# Patient Record
Sex: Male | Born: 1960
Health system: Southern US, Community
[De-identification: ages and names within clinical notes are randomized; demographics above are authoritative.]

## PROBLEM LIST (undated history)

## (undated) DIAGNOSIS — F319 Bipolar disorder, unspecified: Secondary | ICD-10-CM

## (undated) DIAGNOSIS — R519 Headache, unspecified: Secondary | ICD-10-CM

## (undated) DIAGNOSIS — R51 Headache: Secondary | ICD-10-CM

## (undated) DIAGNOSIS — F419 Anxiety disorder, unspecified: Secondary | ICD-10-CM

## (undated) DIAGNOSIS — B1920 Unspecified viral hepatitis C without hepatic coma: Secondary | ICD-10-CM

## (undated) DIAGNOSIS — F329 Major depressive disorder, single episode, unspecified: Secondary | ICD-10-CM

## (undated) DIAGNOSIS — F32A Depression, unspecified: Secondary | ICD-10-CM

## (undated) DIAGNOSIS — K219 Gastro-esophageal reflux disease without esophagitis: Secondary | ICD-10-CM

## (undated) DIAGNOSIS — I219 Acute myocardial infarction, unspecified: Secondary | ICD-10-CM

## (undated) DIAGNOSIS — F1410 Cocaine abuse, uncomplicated: Secondary | ICD-10-CM

## (undated) DIAGNOSIS — M109 Gout, unspecified: Secondary | ICD-10-CM

## (undated) DIAGNOSIS — F1010 Alcohol abuse, uncomplicated: Secondary | ICD-10-CM

## (undated) DIAGNOSIS — Z9989 Dependence on other enabling machines and devices: Secondary | ICD-10-CM

## (undated) DIAGNOSIS — E559 Vitamin D deficiency, unspecified: Secondary | ICD-10-CM

## (undated) DIAGNOSIS — M1990 Unspecified osteoarthritis, unspecified site: Secondary | ICD-10-CM

## (undated) DIAGNOSIS — I10 Essential (primary) hypertension: Secondary | ICD-10-CM

## (undated) DIAGNOSIS — Z720 Tobacco use: Secondary | ICD-10-CM

## (undated) DIAGNOSIS — M549 Dorsalgia, unspecified: Secondary | ICD-10-CM

## (undated) DIAGNOSIS — G8929 Other chronic pain: Secondary | ICD-10-CM

## (undated) DIAGNOSIS — K649 Unspecified hemorrhoids: Secondary | ICD-10-CM

## (undated) DIAGNOSIS — G4733 Obstructive sleep apnea (adult) (pediatric): Secondary | ICD-10-CM

## (undated) DIAGNOSIS — G4700 Insomnia, unspecified: Secondary | ICD-10-CM

## (undated) DIAGNOSIS — F4310 Post-traumatic stress disorder, unspecified: Secondary | ICD-10-CM

## (undated) DIAGNOSIS — N189 Chronic kidney disease, unspecified: Secondary | ICD-10-CM

## (undated) HISTORY — PX: PATELLA FRACTURE SURGERY: SHX735

## (undated) HISTORY — PX: FRACTURE SURGERY: SHX138

## (undated) HISTORY — PX: TONSILLECTOMY: SUR1361

---

## 2001-02-10 ENCOUNTER — Inpatient Hospital Stay (HOSPITAL_COMMUNITY): Admission: EM | Admit: 2001-02-10 | Discharge: 2001-03-04 | Payer: Self-pay | Admitting: Psychiatry

## 2001-02-19 ENCOUNTER — Encounter (HOSPITAL_COMMUNITY): Payer: Self-pay | Admitting: Psychiatry

## 2001-02-25 ENCOUNTER — Encounter: Payer: Self-pay | Admitting: Psychiatry

## 2001-05-06 ENCOUNTER — Encounter: Admission: RE | Admit: 2001-05-06 | Discharge: 2001-05-06 | Payer: Self-pay | Admitting: Internal Medicine

## 2001-05-06 ENCOUNTER — Encounter: Payer: Self-pay | Admitting: Internal Medicine

## 2001-07-05 ENCOUNTER — Encounter: Payer: Self-pay | Admitting: Internal Medicine

## 2001-07-05 ENCOUNTER — Encounter: Admission: RE | Admit: 2001-07-05 | Discharge: 2001-07-05 | Payer: Self-pay | Admitting: Internal Medicine

## 2001-10-05 ENCOUNTER — Inpatient Hospital Stay (HOSPITAL_COMMUNITY): Admission: EM | Admit: 2001-10-05 | Discharge: 2001-10-10 | Payer: Self-pay | Admitting: Psychiatry

## 2001-11-10 ENCOUNTER — Inpatient Hospital Stay (HOSPITAL_COMMUNITY): Admission: AD | Admit: 2001-11-10 | Discharge: 2001-11-22 | Payer: Self-pay | Admitting: Psychiatry

## 2001-11-10 ENCOUNTER — Emergency Department (HOSPITAL_COMMUNITY): Admission: EM | Admit: 2001-11-10 | Discharge: 2001-11-10 | Payer: Self-pay | Admitting: Emergency Medicine

## 2001-11-22 ENCOUNTER — Emergency Department (HOSPITAL_COMMUNITY): Admission: EM | Admit: 2001-11-22 | Discharge: 2001-11-22 | Payer: Self-pay | Admitting: Emergency Medicine

## 2001-12-13 ENCOUNTER — Emergency Department (HOSPITAL_COMMUNITY): Admission: EM | Admit: 2001-12-13 | Discharge: 2001-12-13 | Payer: Self-pay | Admitting: Emergency Medicine

## 2002-04-17 ENCOUNTER — Ambulatory Visit (HOSPITAL_COMMUNITY): Admission: RE | Admit: 2002-04-17 | Discharge: 2002-04-17 | Payer: Self-pay | Admitting: *Deleted

## 2002-04-17 ENCOUNTER — Encounter (INDEPENDENT_AMBULATORY_CARE_PROVIDER_SITE_OTHER): Payer: Self-pay

## 2002-06-06 ENCOUNTER — Ambulatory Visit (HOSPITAL_COMMUNITY): Admission: RE | Admit: 2002-06-06 | Discharge: 2002-06-06 | Payer: Self-pay | Admitting: *Deleted

## 2002-07-24 ENCOUNTER — Encounter: Payer: Self-pay | Admitting: Emergency Medicine

## 2002-07-24 ENCOUNTER — Inpatient Hospital Stay (HOSPITAL_COMMUNITY): Admission: EM | Admit: 2002-07-24 | Discharge: 2002-07-30 | Payer: Self-pay | Admitting: Psychiatry

## 2004-09-10 ENCOUNTER — Ambulatory Visit: Payer: Self-pay | Admitting: Psychiatry

## 2004-09-10 ENCOUNTER — Inpatient Hospital Stay (HOSPITAL_COMMUNITY): Admission: EM | Admit: 2004-09-10 | Discharge: 2004-09-20 | Payer: Self-pay | Admitting: Psychiatry

## 2004-09-10 ENCOUNTER — Encounter: Payer: Self-pay | Admitting: Emergency Medicine

## 2004-12-19 ENCOUNTER — Emergency Department (HOSPITAL_COMMUNITY): Admission: EM | Admit: 2004-12-19 | Discharge: 2004-12-20 | Payer: Self-pay | Admitting: Emergency Medicine

## 2005-07-23 ENCOUNTER — Emergency Department (HOSPITAL_COMMUNITY): Admission: EM | Admit: 2005-07-23 | Discharge: 2005-07-24 | Payer: Self-pay | Admitting: Emergency Medicine

## 2005-07-27 ENCOUNTER — Emergency Department (HOSPITAL_COMMUNITY): Admission: EM | Admit: 2005-07-27 | Discharge: 2005-07-27 | Payer: Self-pay | Admitting: Family Medicine

## 2005-07-27 ENCOUNTER — Ambulatory Visit (HOSPITAL_COMMUNITY): Admission: RE | Admit: 2005-07-27 | Discharge: 2005-07-27 | Payer: Self-pay | Admitting: Family Medicine

## 2005-09-01 ENCOUNTER — Emergency Department (HOSPITAL_COMMUNITY): Admission: EM | Admit: 2005-09-01 | Discharge: 2005-09-01 | Payer: Self-pay | Admitting: Emergency Medicine

## 2005-09-06 ENCOUNTER — Inpatient Hospital Stay (HOSPITAL_COMMUNITY): Admission: EM | Admit: 2005-09-06 | Discharge: 2005-09-08 | Payer: Self-pay | Admitting: Urology

## 2005-12-07 ENCOUNTER — Ambulatory Visit: Payer: Self-pay | Admitting: Family Medicine

## 2005-12-28 ENCOUNTER — Ambulatory Visit: Payer: Self-pay | Admitting: Family Medicine

## 2006-01-23 ENCOUNTER — Ambulatory Visit: Payer: Self-pay | Admitting: Family Medicine

## 2006-05-19 ENCOUNTER — Inpatient Hospital Stay (HOSPITAL_COMMUNITY): Admission: EM | Admit: 2006-05-19 | Discharge: 2006-05-21 | Payer: Self-pay | Admitting: Emergency Medicine

## 2008-02-07 ENCOUNTER — Emergency Department (HOSPITAL_COMMUNITY): Admission: EM | Admit: 2008-02-07 | Discharge: 2008-02-07 | Payer: Self-pay | Admitting: Emergency Medicine

## 2008-05-04 ENCOUNTER — Emergency Department (HOSPITAL_COMMUNITY): Admission: EM | Admit: 2008-05-04 | Discharge: 2008-05-05 | Payer: Self-pay | Admitting: Emergency Medicine

## 2008-05-31 ENCOUNTER — Emergency Department (HOSPITAL_COMMUNITY): Admission: EM | Admit: 2008-05-31 | Discharge: 2008-06-01 | Payer: Self-pay | Admitting: Emergency Medicine

## 2008-06-01 ENCOUNTER — Ambulatory Visit: Payer: Self-pay | Admitting: *Deleted

## 2008-06-01 ENCOUNTER — Inpatient Hospital Stay (HOSPITAL_COMMUNITY): Admission: RE | Admit: 2008-06-01 | Discharge: 2008-06-08 | Payer: Self-pay | Admitting: *Deleted

## 2009-04-26 ENCOUNTER — Emergency Department (HOSPITAL_COMMUNITY): Admission: EM | Admit: 2009-04-26 | Discharge: 2009-04-27 | Payer: Self-pay | Admitting: Emergency Medicine

## 2009-04-27 ENCOUNTER — Ambulatory Visit: Payer: Self-pay | Admitting: Psychiatry

## 2009-04-27 ENCOUNTER — Inpatient Hospital Stay (HOSPITAL_COMMUNITY): Admission: AD | Admit: 2009-04-27 | Discharge: 2009-04-30 | Payer: Self-pay | Admitting: Psychiatry

## 2009-12-08 ENCOUNTER — Ambulatory Visit: Payer: Self-pay | Admitting: Psychiatry

## 2009-12-08 ENCOUNTER — Emergency Department (HOSPITAL_COMMUNITY): Admission: EM | Admit: 2009-12-08 | Discharge: 2009-12-08 | Payer: Self-pay | Admitting: Emergency Medicine

## 2009-12-08 ENCOUNTER — Inpatient Hospital Stay (HOSPITAL_COMMUNITY): Admission: AD | Admit: 2009-12-08 | Discharge: 2009-12-10 | Payer: Self-pay | Admitting: Psychiatry

## 2009-12-14 ENCOUNTER — Emergency Department (HOSPITAL_COMMUNITY): Admission: EM | Admit: 2009-12-14 | Discharge: 2009-12-14 | Payer: Self-pay | Admitting: Emergency Medicine

## 2009-12-14 ENCOUNTER — Emergency Department (HOSPITAL_COMMUNITY)
Admission: EM | Admit: 2009-12-14 | Discharge: 2009-12-21 | Disposition: A | Discharge status: Other Acute Inpatient Hospital | Payer: Self-pay | Source: Home / Self Care | Admitting: Emergency Medicine

## 2010-03-13 ENCOUNTER — Emergency Department (HOSPITAL_COMMUNITY)
Admission: EM | Admit: 2010-03-13 | Discharge: 2010-03-13 | Payer: Self-pay | Source: Home / Self Care | Admitting: Emergency Medicine

## 2010-03-16 ENCOUNTER — Emergency Department (HOSPITAL_COMMUNITY)
Admission: EM | Admit: 2010-03-16 | Discharge: 2010-03-17 | Disposition: A | Discharge status: Other Acute Inpatient Hospital | Payer: Self-pay | Source: Home / Self Care | Admitting: Emergency Medicine

## 2010-06-20 LAB — URINALYSIS, ROUTINE W REFLEX MICROSCOPIC
Bilirubin Urine: NEGATIVE
Bilirubin Urine: NEGATIVE
Glucose, UA: NEGATIVE mg/dL
Glucose, UA: NEGATIVE mg/dL
Hgb urine dipstick: NEGATIVE
Ketones, ur: 15 mg/dL — AB
Ketones, ur: NEGATIVE mg/dL
Nitrite: NEGATIVE
Nitrite: POSITIVE — AB
Protein, ur: 30 mg/dL — AB
Protein, ur: NEGATIVE mg/dL
Specific Gravity, Urine: 1.018 (ref 1.005–1.030)
Specific Gravity, Urine: 1.03 (ref 1.005–1.030)
Urobilinogen, UA: 0.2 mg/dL (ref 0.0–1.0)
Urobilinogen, UA: 1 mg/dL (ref 0.0–1.0)
pH: 6.5 (ref 5.0–8.0)
pH: 8 (ref 5.0–8.0)

## 2010-06-20 LAB — COMPREHENSIVE METABOLIC PANEL
ALT: 187 U/L — ABNORMAL HIGH (ref 0–53)
ALT: 267 U/L — ABNORMAL HIGH (ref 0–53)
AST: 125 U/L — ABNORMAL HIGH (ref 0–37)
AST: 73 U/L — ABNORMAL HIGH (ref 0–37)
Albumin: 3.9 g/dL (ref 3.5–5.2)
Albumin: 4 g/dL (ref 3.5–5.2)
Alkaline Phosphatase: 47 U/L (ref 39–117)
Alkaline Phosphatase: 50 U/L (ref 39–117)
BUN: 15 mg/dL (ref 6–23)
BUN: 8 mg/dL (ref 6–23)
CO2: 19 mEq/L (ref 19–32)
CO2: 25 mEq/L (ref 19–32)
Calcium: 9.2 mg/dL (ref 8.4–10.5)
Calcium: 9.6 mg/dL (ref 8.4–10.5)
Chloride: 108 mEq/L (ref 96–112)
Chloride: 98 mEq/L (ref 96–112)
Creatinine, Ser: 1.02 mg/dL (ref 0.4–1.5)
Creatinine, Ser: 1.21 mg/dL (ref 0.4–1.5)
GFR calc Af Amer: >60 mL/min (ref >60)
GFR calc Af Amer: >60 mL/min (ref >60)
GFR calc non Af Amer: >60 mL/min (ref >60)
GFR calc non Af Amer: >60 mL/min (ref >60)
Glucose, Bld: 101 mg/dL — ABNORMAL HIGH (ref 70–99)
Glucose, Bld: 119 mg/dL — ABNORMAL HIGH (ref 70–99)
Potassium: 3.3 mEq/L — ABNORMAL LOW (ref 3.5–5.1)
Potassium: 3.6 mEq/L (ref 3.5–5.1)
Sodium: 131 mEq/L — ABNORMAL LOW (ref 135–145)
Sodium: 141 mEq/L (ref 135–145)
Total Bilirubin: 0.6 mg/dL (ref 0.3–1.2)
Total Bilirubin: 0.8 mg/dL (ref 0.3–1.2)
Total Protein: 7.1 g/dL (ref 6.0–8.3)
Total Protein: 7.4 g/dL (ref 6.0–8.3)

## 2010-06-20 LAB — CBC
HCT: 43.5 % (ref 39.0–52.0)
HCT: 44.9 % (ref 39.0–52.0)
Hemoglobin: 15.6 g/dL (ref 13.0–17.0)
Hemoglobin: 16 g/dL (ref 13.0–17.0)
MCH: 30.2 pg (ref 26.0–34.0)
MCH: 30.4 pg (ref 26.0–34.0)
MCHC: 35.6 g/dL (ref 30.0–36.0)
MCHC: 35.9 g/dL (ref 30.0–36.0)
MCV: 84.1 fL (ref 78.0–100.0)
MCV: 85.2 fL (ref 78.0–100.0)
Platelets: 248 10*3/uL (ref 150–400)
Platelets: 274 10*3/uL (ref 150–400)
RBC: 5.17 MIL/uL (ref 4.22–5.81)
RBC: 5.27 MIL/uL (ref 4.22–5.81)
RDW: 13.9 % (ref 11.5–15.5)
RDW: 14.3 % (ref 11.5–15.5)
WBC: 10.3 10*3/uL (ref 4.0–10.5)
WBC: 8.8 10*3/uL (ref 4.0–10.5)

## 2010-06-20 LAB — DIFFERENTIAL
Basophils Absolute: 0 10*3/uL (ref 0.0–0.1)
Basophils Relative: 1 % (ref 0–1)
Eosinophils Absolute: 0.2 10*3/uL (ref 0.0–0.7)
Eosinophils Relative: 2 % (ref 0–5)
Lymphocytes Relative: 25 % (ref 12–46)
Lymphs Abs: 2.2 10*3/uL (ref 0.7–4.0)
Monocytes Absolute: 0.7 10*3/uL (ref 0.1–1.0)
Monocytes Relative: 8 % (ref 3–12)
Neutro Abs: 5.7 10*3/uL (ref 1.7–7.7)
Neutrophils Relative %: 65 % (ref 43–77)

## 2010-06-20 LAB — RAPID URINE DRUG SCREEN, HOSP PERFORMED
Amphetamines: NOT DETECTED
Amphetamines: NOT DETECTED
Barbiturates: NOT DETECTED
Barbiturates: NOT DETECTED
Benzodiazepines: NOT DETECTED
Benzodiazepines: NOT DETECTED
Cocaine: POSITIVE — AB
Cocaine: POSITIVE — AB
Opiates: NOT DETECTED
Opiates: NOT DETECTED
Tetrahydrocannabinol: POSITIVE — AB
Tetrahydrocannabinol: POSITIVE — AB

## 2010-06-20 LAB — CK TOTAL AND CKMB (NOT AT ARMC)
CK, MB: 1.9 ng/mL (ref 0.3–4.0)
CK, MB: 2.3 ng/mL (ref 0.3–4.0)
Relative Index: 1.2 (ref 0.0–2.5)
Relative Index: 1.2 (ref 0.0–2.5)
Total CK: 161 U/L (ref 7–232)
Total CK: 188 U/L (ref 7–232)

## 2010-06-20 LAB — TROPONIN I
Troponin I: 0.01 ng/mL (ref 0.00–0.06)
Troponin I: 0.01 ng/mL (ref 0.00–0.06)

## 2010-06-20 LAB — URINE MICROSCOPIC-ADD ON

## 2010-06-20 LAB — ETHANOL: Alcohol, Ethyl (B): 7 mg/dL (ref 0–10)

## 2010-06-23 LAB — URINE MICROSCOPIC-ADD ON

## 2010-06-23 LAB — GLUCOSE, CAPILLARY
Glucose-Capillary: 100 mg/dL — ABNORMAL HIGH (ref 70–99)
Glucose-Capillary: 105 mg/dL — ABNORMAL HIGH (ref 70–99)
Glucose-Capillary: 127 mg/dL — ABNORMAL HIGH (ref 70–99)
Glucose-Capillary: 101 mg/dL — ABNORMAL HIGH (ref 70–99)
Glucose-Capillary: 102 mg/dL — ABNORMAL HIGH (ref 70–99)
Glucose-Capillary: 105 mg/dL — ABNORMAL HIGH (ref 70–99)
Glucose-Capillary: 111 mg/dL — ABNORMAL HIGH (ref 70–99)
Glucose-Capillary: 115 mg/dL — ABNORMAL HIGH (ref 70–99)
Glucose-Capillary: 122 mg/dL — ABNORMAL HIGH (ref 70–99)
Glucose-Capillary: 140 mg/dL — ABNORMAL HIGH (ref 70–99)
Glucose-Capillary: 142 mg/dL — ABNORMAL HIGH (ref 70–99)

## 2010-06-23 LAB — DIFFERENTIAL
Basophils Absolute: 0 10*3/uL (ref 0.0–0.1)
Basophils Absolute: 0 10*3/uL (ref 0.0–0.1)
Basophils Absolute: 0 10*3/uL (ref 0.0–0.1)
Basophils Relative: 0 % (ref 0–1)
Basophils Relative: 0 % (ref 0–1)
Basophils Relative: 0 % (ref 0–1)
Eosinophils Absolute: 0.2 10*3/uL (ref 0.0–0.7)
Eosinophils Absolute: 0.3 10*3/uL (ref 0.0–0.7)
Eosinophils Absolute: 0.4 10*3/uL (ref 0.0–0.7)
Eosinophils Relative: 2 % (ref 0–5)
Eosinophils Relative: 3 % (ref 0–5)
Eosinophils Relative: 5 % (ref 0–5)
Lymphocytes Relative: 19 % (ref 12–46)
Lymphocytes Relative: 22 % (ref 12–46)
Lymphocytes Relative: 26 % (ref 12–46)
Lymphs Abs: 1.8 10*3/uL (ref 0.7–4.0)
Lymphs Abs: 1.8 10*3/uL (ref 0.7–4.0)
Lymphs Abs: 2.1 10*3/uL (ref 0.7–4.0)
Monocytes Absolute: 0.5 10*3/uL (ref 0.1–1.0)
Monocytes Absolute: 0.7 10*3/uL (ref 0.1–1.0)
Monocytes Absolute: 0.6 10*3/uL (ref 0.1–1.0)
Monocytes Relative: 7 % (ref 3–12)
Monocytes Relative: 7 % (ref 3–12)
Monocytes Relative: 7 % (ref 3–12)
Neutro Abs: 5.6 10*3/uL (ref 1.7–7.7)
Neutro Abs: 6.8 10*3/uL (ref 1.7–7.7)
Neutro Abs: 5.1 10*3/uL (ref 1.7–7.7)
Neutrophils Relative %: 69 % (ref 43–77)
Neutrophils Relative %: 70 % (ref 43–77)
Neutrophils Relative %: 62 % (ref 43–77)

## 2010-06-23 LAB — URINALYSIS, ROUTINE W REFLEX MICROSCOPIC
Glucose, UA: NEGATIVE mg/dL
Hgb urine dipstick: NEGATIVE
Ketones, ur: 15 mg/dL — AB
Nitrite: NEGATIVE
Protein, ur: NEGATIVE mg/dL
Specific Gravity, Urine: 1.024 (ref 1.005–1.030)
Urobilinogen, UA: 2 mg/dL — ABNORMAL HIGH (ref 0.0–1.0)
pH: 6 (ref 5.0–8.0)

## 2010-06-23 LAB — CBC
HCT: 46.2 % (ref 39.0–52.0)
HCT: 46.4 % (ref 39.0–52.0)
HCT: 45.9 % (ref 39.0–52.0)
Hemoglobin: 16 g/dL (ref 13.0–17.0)
Hemoglobin: 16.8 g/dL (ref 13.0–17.0)
Hemoglobin: 16.1 g/dL (ref 13.0–17.0)
MCH: 30.3 pg (ref 26.0–34.0)
MCH: 30.3 pg (ref 26.0–34.0)
MCH: 30 pg (ref 26.0–34.0)
MCHC: 34.5 g/dL (ref 30.0–36.0)
MCHC: 36.4 g/dL — ABNORMAL HIGH (ref 30.0–36.0)
MCHC: 35.1 g/dL (ref 30.0–36.0)
MCV: 83.2 fL (ref 78.0–100.0)
MCV: 88.1 fL (ref 78.0–100.0)
MCV: 85.5 fL (ref 78.0–100.0)
Platelets: 241 10*3/uL (ref 150–400)
Platelets: 286 10*3/uL (ref 150–400)
Platelets: 260 10*3/uL (ref 150–400)
RBC: 5.27 MIL/uL (ref 4.22–5.81)
RBC: 5.55 MIL/uL (ref 4.22–5.81)
RBC: 5.37 MIL/uL (ref 4.22–5.81)
RDW: 13.1 % (ref 11.5–15.5)
RDW: 14.2 % (ref 11.5–15.5)
RDW: 13.7 % (ref 11.5–15.5)
WBC: 8.1 10*3/uL (ref 4.0–10.5)
WBC: 9.6 10*3/uL (ref 4.0–10.5)
WBC: 8.1 10*3/uL (ref 4.0–10.5)

## 2010-06-23 LAB — BASIC METABOLIC PANEL
BUN: 11 mg/dL (ref 6–23)
BUN: 7 mg/dL (ref 6–23)
BUN: 8 mg/dL (ref 6–23)
CO2: 23 mEq/L (ref 19–32)
CO2: 24 mEq/L (ref 19–32)
CO2: 24 mEq/L (ref 19–32)
Calcium: 9.4 mg/dL (ref 8.4–10.5)
Calcium: 9.5 mg/dL (ref 8.4–10.5)
Calcium: 9.3 mg/dL (ref 8.4–10.5)
Chloride: 105 mEq/L (ref 96–112)
Chloride: 109 mEq/L (ref 96–112)
Chloride: 109 mEq/L (ref 96–112)
Creatinine, Ser: 0.96 mg/dL (ref 0.4–1.5)
Creatinine, Ser: 1.05 mg/dL (ref 0.4–1.5)
Creatinine, Ser: 1.05 mg/dL (ref 0.4–1.5)
GFR calc Af Amer: >60 mL/min (ref >60)
GFR calc Af Amer: >60 mL/min (ref >60)
GFR calc Af Amer: >60 mL/min (ref >60)
GFR calc non Af Amer: >60 mL/min (ref >60)
GFR calc non Af Amer: >60 mL/min (ref >60)
GFR calc non Af Amer: >60 mL/min (ref >60)
Glucose, Bld: 135 mg/dL — ABNORMAL HIGH (ref 70–99)
Glucose, Bld: 180 mg/dL — ABNORMAL HIGH (ref 70–99)
Glucose, Bld: 102 mg/dL — ABNORMAL HIGH (ref 70–99)
Potassium: 3.2 mEq/L — ABNORMAL LOW (ref 3.5–5.1)
Potassium: 3.5 mEq/L (ref 3.5–5.1)
Potassium: 4.1 mEq/L (ref 3.5–5.1)
Sodium: 138 mEq/L (ref 135–145)
Sodium: 139 mEq/L (ref 135–145)
Sodium: 141 mEq/L (ref 135–145)

## 2010-06-23 LAB — ETHANOL
Alcohol, Ethyl (B): <5 mg/dL (ref 0–10)
Alcohol, Ethyl (B): <5 mg/dL (ref 0–10)
Alcohol, Ethyl (B): <5 mg/dL (ref 0–10)

## 2010-06-23 LAB — RAPID URINE DRUG SCREEN, HOSP PERFORMED
Amphetamines: NOT DETECTED
Amphetamines: NOT DETECTED
Amphetamines: NOT DETECTED
Barbiturates: NOT DETECTED
Barbiturates: NOT DETECTED
Barbiturates: NOT DETECTED
Benzodiazepines: NOT DETECTED
Benzodiazepines: NOT DETECTED
Benzodiazepines: NOT DETECTED
Cocaine: POSITIVE — AB
Cocaine: POSITIVE — AB
Cocaine: POSITIVE — AB
Opiates: NOT DETECTED
Opiates: NOT DETECTED
Opiates: NOT DETECTED
Tetrahydrocannabinol: POSITIVE — AB
Tetrahydrocannabinol: POSITIVE — AB
Tetrahydrocannabinol: POSITIVE — AB

## 2010-06-23 LAB — TRICYCLICS SCREEN, URINE
TCA Scrn: 19.8 — AB
TCA Scrn: NOT DETECTED

## 2010-06-23 LAB — ACETAMINOPHEN LEVEL: Acetaminophen (Tylenol), Serum: <10 ug/mL — ABNORMAL LOW (ref 10–30)

## 2010-06-23 LAB — LITHIUM LEVEL: Lithium Lvl: <0.25 mEq/L — ABNORMAL LOW (ref 0.80–1.40)

## 2010-06-26 LAB — CBC
HCT: 48.6 % (ref 39.0–52.0)
Hemoglobin: 16.3 g/dL (ref 13.0–17.0)
MCHC: 33.5 g/dL (ref 30.0–36.0)
MCV: 88.3 fL (ref 78.0–100.0)
Platelets: 290 10*3/uL (ref 150–400)
RBC: 5.5 MIL/uL (ref 4.22–5.81)
RDW: 13.9 % (ref 11.5–15.5)
WBC: 10.9 10*3/uL — ABNORMAL HIGH (ref 4.0–10.5)

## 2010-06-26 LAB — GLUCOSE, CAPILLARY
Glucose-Capillary: 105 mg/dL — ABNORMAL HIGH (ref 70–99)
Glucose-Capillary: 110 mg/dL — ABNORMAL HIGH (ref 70–99)
Glucose-Capillary: 130 mg/dL — ABNORMAL HIGH (ref 70–99)
Glucose-Capillary: 138 mg/dL — ABNORMAL HIGH (ref 70–99)
Glucose-Capillary: 90 mg/dL (ref 70–99)
Glucose-Capillary: 92 mg/dL (ref 70–99)

## 2010-06-26 LAB — BASIC METABOLIC PANEL
BUN: 12 mg/dL (ref 6–23)
CO2: 24 mEq/L (ref 19–32)
Calcium: 9.4 mg/dL (ref 8.4–10.5)
Chloride: 102 mEq/L (ref 96–112)
Creatinine, Ser: 0.94 mg/dL (ref 0.4–1.5)
GFR calc Af Amer: >60 mL/min (ref >60)
GFR calc non Af Amer: >60 mL/min (ref >60)
Glucose, Bld: 90 mg/dL (ref 70–99)
Potassium: 4.4 mEq/L (ref 3.5–5.1)
Sodium: 134 mEq/L — ABNORMAL LOW (ref 135–145)

## 2010-06-26 LAB — DIFFERENTIAL
Basophils Absolute: 0.1 10*3/uL (ref 0.0–0.1)
Basophils Relative: 1 % (ref 0–1)
Eosinophils Absolute: 0.4 10*3/uL (ref 0.0–0.7)
Eosinophils Relative: 4 % (ref 0–5)
Lymphocytes Relative: 26 % (ref 12–46)
Lymphs Abs: 2.8 10*3/uL (ref 0.7–4.0)
Monocytes Absolute: 0.8 10*3/uL (ref 0.1–1.0)
Monocytes Relative: 7 % (ref 3–12)
Neutro Abs: 6.8 10*3/uL (ref 1.7–7.7)
Neutrophils Relative %: 62 % (ref 43–77)

## 2010-06-26 LAB — ETHANOL: Alcohol, Ethyl (B): <5 mg/dL (ref 0–10)

## 2010-06-26 LAB — RAPID URINE DRUG SCREEN, HOSP PERFORMED
Amphetamines: NOT DETECTED
Barbiturates: NOT DETECTED
Benzodiazepines: NOT DETECTED
Cocaine: POSITIVE — AB
Opiates: NOT DETECTED
Tetrahydrocannabinol: NOT DETECTED

## 2010-06-26 LAB — ACETAMINOPHEN LEVEL: Acetaminophen (Tylenol), Serum: <10 ug/mL — ABNORMAL LOW (ref 10–30)

## 2010-07-25 LAB — COMPREHENSIVE METABOLIC PANEL
ALT: 390 U/L — ABNORMAL HIGH (ref 0–53)
AST: 229 U/L — ABNORMAL HIGH (ref 0–37)
Albumin: 4.6 g/dL (ref 3.5–5.2)
Alkaline Phosphatase: 56 U/L (ref 39–117)
BUN: 9 mg/dL (ref 6–23)
CO2: 22 mEq/L (ref 19–32)
Calcium: 9.7 mg/dL (ref 8.4–10.5)
Chloride: 102 mEq/L (ref 96–112)
Creatinine, Ser: 1.03 mg/dL (ref 0.4–1.5)
GFR calc Af Amer: >60 mL/min (ref >60)
GFR calc non Af Amer: >60 mL/min (ref >60)
Glucose, Bld: 101 mg/dL — ABNORMAL HIGH (ref 70–99)
Potassium: 3.3 mEq/L — ABNORMAL LOW (ref 3.5–5.1)
Sodium: 134 mEq/L — ABNORMAL LOW (ref 135–145)
Total Bilirubin: 0.9 mg/dL (ref 0.3–1.2)
Total Protein: 8.2 g/dL (ref 6.0–8.3)

## 2010-07-25 LAB — CBC
HCT: 31.5 % — ABNORMAL LOW (ref 39.0–52.0)
Hemoglobin: 10.7 g/dL — ABNORMAL LOW (ref 13.0–17.0)
MCHC: 34.2 g/dL (ref 30.0–36.0)
MCV: 88.1 fL (ref 78.0–100.0)
Platelets: 104 10*3/uL — ABNORMAL LOW (ref 150–400)
RBC: 3.57 MIL/uL — ABNORMAL LOW (ref 4.22–5.81)
RDW: 13.5 % (ref 11.5–15.5)
WBC: 11.3 10*3/uL — ABNORMAL HIGH (ref 4.0–10.5)

## 2010-07-25 LAB — URINE MICROSCOPIC-ADD ON

## 2010-07-25 LAB — URINALYSIS, ROUTINE W REFLEX MICROSCOPIC
Glucose, UA: NEGATIVE mg/dL
Hgb urine dipstick: NEGATIVE
Ketones, ur: 15 mg/dL — AB
Nitrite: NEGATIVE
Protein, ur: NEGATIVE mg/dL
Specific Gravity, Urine: 1.02 (ref 1.005–1.030)
Urobilinogen, UA: 2 mg/dL — ABNORMAL HIGH (ref 0.0–1.0)
pH: 8 (ref 5.0–8.0)

## 2010-07-25 LAB — GLUCOSE, CAPILLARY: Glucose-Capillary: 100 mg/dL — ABNORMAL HIGH (ref 70–99)

## 2010-07-25 LAB — DIFFERENTIAL
Basophils Absolute: 0.1 10*3/uL (ref 0.0–0.1)
Basophils Relative: 1 % (ref 0–1)
Eosinophils Absolute: 0.1 10*3/uL (ref 0.0–0.7)
Eosinophils Relative: 1 % (ref 0–5)
Lymphocytes Relative: 22 % (ref 12–46)
Lymphs Abs: 2.5 10*3/uL (ref 0.7–4.0)
Monocytes Absolute: 0.6 10*3/uL (ref 0.1–1.0)
Monocytes Relative: 5 % (ref 3–12)
Neutro Abs: 8 10*3/uL — ABNORMAL HIGH (ref 1.7–7.7)
Neutrophils Relative %: 71 % (ref 43–77)

## 2010-07-25 LAB — LIPASE, BLOOD: Lipase: 40 U/L (ref 11–59)

## 2010-07-26 LAB — RAPID URINE DRUG SCREEN, HOSP PERFORMED
Amphetamines: NOT DETECTED
Barbiturates: NOT DETECTED
Benzodiazepines: NOT DETECTED
Cocaine: POSITIVE — AB
Opiates: NOT DETECTED
Tetrahydrocannabinol: POSITIVE — AB

## 2010-07-26 LAB — GLUCOSE, CAPILLARY
Glucose-Capillary: 107 mg/dL — ABNORMAL HIGH (ref 70–99)
Glucose-Capillary: 124 mg/dL — ABNORMAL HIGH (ref 70–99)
Glucose-Capillary: 141 mg/dL — ABNORMAL HIGH (ref 70–99)
Glucose-Capillary: 141 mg/dL — ABNORMAL HIGH (ref 70–99)
Glucose-Capillary: 147 mg/dL — ABNORMAL HIGH (ref 70–99)
Glucose-Capillary: 150 mg/dL — ABNORMAL HIGH (ref 70–99)
Glucose-Capillary: 160 mg/dL — ABNORMAL HIGH (ref 70–99)
Glucose-Capillary: 98 mg/dL (ref 70–99)

## 2010-07-26 LAB — ACETAMINOPHEN LEVEL: Acetaminophen (Tylenol), Serum: <10 ug/mL — ABNORMAL LOW (ref 10–30)

## 2010-07-26 LAB — POCT I-STAT, CHEM 8
BUN: 12 mg/dL (ref 6–23)
Calcium, Ion: 1.13 mmol/L (ref 1.12–1.32)
Chloride: 106 mEq/L (ref 96–112)
Creatinine, Ser: 0.9 mg/dL (ref 0.4–1.5)
Glucose, Bld: 111 mg/dL — ABNORMAL HIGH (ref 70–99)
HCT: 44 % (ref 39.0–52.0)
Hemoglobin: 15 g/dL (ref 13.0–17.0)
Potassium: 3.2 mEq/L — ABNORMAL LOW (ref 3.5–5.1)
Sodium: 142 mEq/L (ref 135–145)
TCO2: 23 mmol/L (ref 0–100)

## 2010-07-26 LAB — ETHANOL: Alcohol, Ethyl (B): 5 mg/dL (ref 0–10)

## 2010-08-15 ENCOUNTER — Emergency Department (HOSPITAL_COMMUNITY)
Admission: EM | Admit: 2010-08-15 | Discharge: 2010-08-16 | Disposition: A | Discharge status: Other Acute Inpatient Hospital | Payer: 59 | Attending: Emergency Medicine | Admitting: Emergency Medicine

## 2010-08-15 DIAGNOSIS — R45851 Suicidal ideations: Secondary | ICD-10-CM | POA: Insufficient documentation

## 2010-08-15 DIAGNOSIS — R1013 Epigastric pain: Secondary | ICD-10-CM | POA: Insufficient documentation

## 2010-08-15 DIAGNOSIS — K3189 Other diseases of stomach and duodenum: Secondary | ICD-10-CM | POA: Insufficient documentation

## 2010-08-15 DIAGNOSIS — F3130 Bipolar disorder, current episode depressed, mild or moderate severity, unspecified: Secondary | ICD-10-CM | POA: Insufficient documentation

## 2010-08-15 DIAGNOSIS — R443 Hallucinations, unspecified: Secondary | ICD-10-CM | POA: Insufficient documentation

## 2010-08-15 DIAGNOSIS — F1920 Other psychoactive substance dependence, uncomplicated: Secondary | ICD-10-CM

## 2010-08-15 DIAGNOSIS — R45850 Homicidal ideations: Secondary | ICD-10-CM | POA: Insufficient documentation

## 2010-08-15 LAB — DIFFERENTIAL
Basophils Absolute: 0 10*3/uL (ref 0.0–0.1)
Basophils Relative: 0 % (ref 0–1)
Eosinophils Absolute: 0.1 10*3/uL (ref 0.0–0.7)
Eosinophils Relative: 1 % (ref 0–5)
Lymphocytes Relative: 15 % (ref 12–46)
Lymphs Abs: 1.4 10*3/uL (ref 0.7–4.0)
Monocytes Absolute: 0.6 10*3/uL (ref 0.1–1.0)
Monocytes Relative: 7 % (ref 3–12)
Neutro Abs: 7.4 10*3/uL (ref 1.7–7.7)
Neutrophils Relative %: 77 % (ref 43–77)

## 2010-08-15 LAB — CBC
HCT: 43.1 % (ref 39.0–52.0)
Hemoglobin: 15.4 g/dL (ref 13.0–17.0)
MCH: 29.3 pg (ref 26.0–34.0)
MCHC: 35.7 g/dL (ref 30.0–36.0)
MCV: 81.9 fL (ref 78.0–100.0)
Platelets: 231 10*3/uL (ref 150–400)
RBC: 5.26 MIL/uL (ref 4.22–5.81)
RDW: 13.8 % (ref 11.5–15.5)
WBC: 9.5 10*3/uL (ref 4.0–10.5)

## 2010-08-15 LAB — ETHANOL: Alcohol, Ethyl (B): <11 mg/dL — ABNORMAL HIGH (ref 0–10)

## 2010-08-15 LAB — RAPID URINE DRUG SCREEN, HOSP PERFORMED
Amphetamines: NOT DETECTED
Barbiturates: POSITIVE — AB
Benzodiazepines: POSITIVE — AB
Cocaine: POSITIVE — AB
Opiates: NOT DETECTED
Tetrahydrocannabinol: POSITIVE — AB

## 2010-08-15 LAB — COMPREHENSIVE METABOLIC PANEL
ALT: 214 U/L — ABNORMAL HIGH (ref 0–53)
AST: 99 U/L — ABNORMAL HIGH (ref 0–37)
Albumin: 4.2 g/dL (ref 3.5–5.2)
Alkaline Phosphatase: 57 U/L (ref 39–117)
BUN: 7 mg/dL (ref 6–23)
CO2: 25 mEq/L (ref 19–32)
Calcium: 9.5 mg/dL (ref 8.4–10.5)
Chloride: 105 mEq/L (ref 96–112)
Creatinine, Ser: 0.88 mg/dL (ref 0.4–1.5)
GFR calc Af Amer: >60 mL/min (ref >60)
GFR calc non Af Amer: >60 mL/min (ref >60)
Glucose, Bld: 102 mg/dL — ABNORMAL HIGH (ref 70–99)
Potassium: 3.6 mEq/L (ref 3.5–5.1)
Sodium: 138 mEq/L (ref 135–145)
Total Bilirubin: 0.5 mg/dL (ref 0.3–1.2)
Total Protein: 7.9 g/dL (ref 6.0–8.3)

## 2010-08-15 LAB — ACETAMINOPHEN LEVEL: Acetaminophen (Tylenol), Serum: <15 ug/mL (ref 10–30)

## 2010-08-16 ENCOUNTER — Inpatient Hospital Stay (HOSPITAL_COMMUNITY)
Admission: AD | Admit: 2010-08-16 | Discharge: 2010-08-19 | DRG: 885 | Disposition: A | Discharge status: Home / Self Care | Payer: PRIVATE HEALTH INSURANCE | Source: Ambulatory Visit | Attending: Psychiatry | Admitting: Psychiatry

## 2010-08-16 DIAGNOSIS — M069 Rheumatoid arthritis, unspecified: Secondary | ICD-10-CM

## 2010-08-16 DIAGNOSIS — I10 Essential (primary) hypertension: Secondary | ICD-10-CM

## 2010-08-16 DIAGNOSIS — G8929 Other chronic pain: Secondary | ICD-10-CM

## 2010-08-16 DIAGNOSIS — F319 Bipolar disorder, unspecified: Secondary | ICD-10-CM

## 2010-08-16 DIAGNOSIS — M549 Dorsalgia, unspecified: Secondary | ICD-10-CM

## 2010-08-16 DIAGNOSIS — K219 Gastro-esophageal reflux disease without esophagitis: Secondary | ICD-10-CM

## 2010-08-16 DIAGNOSIS — F209 Schizophrenia, unspecified: Principal | ICD-10-CM

## 2010-08-16 DIAGNOSIS — Z5900 Homelessness unspecified: Secondary | ICD-10-CM

## 2010-08-16 DIAGNOSIS — R45850 Homicidal ideations: Secondary | ICD-10-CM

## 2010-08-16 DIAGNOSIS — R45851 Suicidal ideations: Secondary | ICD-10-CM

## 2010-08-16 DIAGNOSIS — Z9119 Patient's noncompliance with other medical treatment and regimen: Secondary | ICD-10-CM

## 2010-08-16 DIAGNOSIS — Z111 Encounter for screening for respiratory tuberculosis: Secondary | ICD-10-CM

## 2010-08-16 DIAGNOSIS — F1010 Alcohol abuse, uncomplicated: Secondary | ICD-10-CM

## 2010-08-16 DIAGNOSIS — E119 Type 2 diabetes mellitus without complications: Secondary | ICD-10-CM

## 2010-08-16 DIAGNOSIS — F1920 Other psychoactive substance dependence, uncomplicated: Secondary | ICD-10-CM

## 2010-08-16 DIAGNOSIS — Z91199 Patient's noncompliance with other medical treatment and regimen due to unspecified reason: Secondary | ICD-10-CM

## 2010-08-16 LAB — GLUCOSE, CAPILLARY: Glucose-Capillary: 100 mg/dL — ABNORMAL HIGH (ref 70–99)

## 2010-08-18 LAB — GLUCOSE, CAPILLARY: Glucose-Capillary: 153 mg/dL — ABNORMAL HIGH (ref 70–99)

## 2010-08-19 LAB — GLUCOSE, CAPILLARY
Glucose-Capillary: 130 mg/dL — ABNORMAL HIGH (ref 70–99)
Glucose-Capillary: 113 mg/dL — ABNORMAL HIGH (ref 70–99)

## 2010-08-22 LAB — GLUCOSE, CAPILLARY
Glucose-Capillary: 104 mg/dL — ABNORMAL HIGH (ref 70–99)
Glucose-Capillary: 98 mg/dL (ref 70–99)
Glucose-Capillary: 114 mg/dL — ABNORMAL HIGH (ref 70–99)
Glucose-Capillary: 92 mg/dL (ref 70–99)
Glucose-Capillary: 136 mg/dL — ABNORMAL HIGH (ref 70–99)
Glucose-Capillary: 103 mg/dL — ABNORMAL HIGH (ref 70–99)
Glucose-Capillary: 125 mg/dL — ABNORMAL HIGH (ref 70–99)
Glucose-Capillary: 97 mg/dL (ref 70–99)

## 2010-08-23 NOTE — H&P (Signed)
[name], [name]                 ACCOUNT NO.:  [id number]   MEDICAL RECORD NO.:  [id number]          PATIENT TYPE:  IPS   LOCATION:  0304                          FACILITY:  [facility]   PHYSICIAN:  Jasmine Pang, M.D. DATE OF BIRTH:  1960/12/02   DATE OF ADMISSION:  06/01/2008  DATE OF DISCHARGE:                       PSYCHIATRIC ADMISSION ASSESSMENT   This is a 50 year old male voluntarily admitted on June 01, 2008.   HISTORY OF PRESENT ILLNESS:  The patient states that he tried to  overdose on Sunday taking some of his medications, 4 Soma tablets,  drinking wine and smoking crack cocaine.  Initially presented to the  emergency room lethargic with his eyes closed.  He states one of his  significant stressors is that his daughter died a month ago and was  having thoughts that he also wanted to die.  He states where he is  currently living in is drug infested which contributes to his substance  use.  He is trying to get into a facility for long-term care.  Currently  anxious to be discharged because he is not getting paid enough attention  to.   PAST PSYCHIATRIC HISTORY:  The patient has been here before with his  last admission in 2006.  No current outpatient mental health treatment.   SOCIAL HISTORY:  A 50 year old male.  He lives in the [facility] and  has poor social support.   FAMILY HISTORY:  None.   ALCOHOL/DRUG HISTORY:  The patient has been drinking and using cocaine.   PRIMARY CARE [name]:  [name] in [place].  Phone number is [phone].   MEDICAL PROBLEMS:  1. He reports a history of being a diet-controlled diabetic.  2. Diabetes,  3. Hypertension.  4. Chronic back pain.  5. GERD.   MEDICATIONS:  1. Soma 750 mg 1 t.i.d.  2. Flexeril 10 mg 1 t.i.d.  3. Omeprazole 20 mg daily.  4. Ultram 50 mg 2 tablets every 6 hours p.r.n.  5. Acyclovir ointment p.r.n.  6. Amantadine 100 twice a day.  7. Vitamin daily.  8. Lisinopril 40 mg one-half tablet daily.  9.  Metformin 500 mg 1 b.i.d.  10.Potassium chloride 20 mEq 1 tablet daily.  11.Gabapentin 300 mg 1 twice a day.  12.Amlodipine 10 mg one-half tablet daily.  Poor medication      compliance.   DRUG ALLERGIES:  NO KNOWN ALLERGIES.   PHYSICAL EXAMINATION:  GENERAL:  This appears to be a well-nourished  male who was fully assessed at [facility] Emergency Department which  was reviewed.  VITAL SIGNS:  Temperature 96.9, 90 heart rate, 20 respirations, blood  pressure is 128/88.   LABORATORY DATA:  Potassium of 3.2, hemoglobin 15, acetaminophen level  less than 10, alcohol level of 5.  Urine drug screen is positive for  cocaine and positive for THC.   MENTAL STATUS EXAM:  The patient is fully alert.  He is cooperative.  Poor eye contact.  He is appropriately dressed.  Speech is clear.  His  mood is depressed and anxious.  Thought processes are coherent and goal  directed.  No evidence of any psychosis.  No delusional statements made.  Cognitive function intact.  His memory appears intact.  Judgment and  insight are fair.   AXIS I:  Mood disorder, polysubstance abuse.  AXIS II:  Deferred.  AXIS III:  Diet-controlled diabetes, hypertension, chronic pain and  gastroesophageal reflux disease.  AXIS IV:  Social environment, medical problems, psychosocial problems.  AXIS V:  Current is 35-40.   PLAN:  Our plan is to contract for safety.  We will stabilize mood and  thinking.  We will put patient on the Librium protocol.  We will offer  the patient his muscle relaxant.  Continue with his omeprazole and  Ultram for pain.  The patient will be in the dual diagnosis group.  Case  manager will review long-term treatment facilities available to the  patient.  His tentative length of stay is 3-4 days.      Landry Corporal, N.P.      Jasmine Pang, M.D.  Electronically Signed    JO/MEDQ  D:  06/04/2008  T:  06/04/2008  Job:  [id number]
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
## 2010-08-24 NOTE — Discharge Summary (Signed)
  [name], [name]                 ACCOUNT NO.:  [id number]  MEDICAL RECORD NO.:  [id number]           PATIENT TYPE:  I  LOCATION:  0305                          FACILITY:  [facility]  PHYSICIAN:  [name], MD  DATE OF BIRTH:  October 29, 1960  DATE OF ADMISSION:  08/15/2010 DATE OF DISCHARGE:  08/19/2010                              DISCHARGE SUMMARY   IDENTIFYING DATA/REASON FOR ADMISSION:  This was an inpatient psychiatric admission for [name], a 50 year old male with a history of schizophrenia and chronic substance abuse.  Please refer to the admission note for further details pertaining to the symptoms, circumstances and history that led to his hospitalization.  He was given an initial Axis I diagnosis of polysubstance dependence and schizophrenia NOS.  MEDICAL/LABORATORY:  The patient was medically and physically assessed by the psychiatric nurse practitioner.  He was in generally good health without any active or chronic medical problems.  His urine drug screen was positive for barbiturates, benzodiazepines, cocaine and opiates. His liver function indices were mildly elevated consistent with alcohol abuse.  He was detoxified using standard detoxification protocols.  In addition, he was continued on his usual regimen of Risperdal, Cogentin and trazodone.  He was pleasant and cooperative throughout.  He appeared to have some tardive dyskinesia early on in his stay, but this abated after Cogentin was reintroduced.  He worked closely with case management towards an aftercare plan.  In this hospital stay, in contrast to previous once here, Jayvon was open to being referred to an assisted living facility.  On the fifth hospital day, the patient was able to meet with the program director from [facility].  He was accepted there in transfer.  The patient was to follow up at [facility], appointment time to be arranged at the time of this dictation.  DISCHARGE MEDICATIONS: 1.  Risperdal 1 mg b.i.d. and 2 mg nightly. 2. Cogentin 1 mg b.i.d. 3. Trazodone 200 mg nightly.  DISCHARGE DIAGNOSES:  AXIS I:  Schizophrenia not otherwise specified and polysubstance dependence. AXIS II:  Deferred. AXIS III:  No acute or chronic illnesses. AXIS IV:  Stressors severe. AXIS V:  Global assessment of functioning on discharge 45.    [name], MD    SPB/MEDQ  D:  08/19/2010  T:  08/19/2010  Job:  [id number]  Electronically Signed by Geralyn Flash MD on 08/24/2010 02:01:28 PM

## 2010-08-26 NOTE — Discharge Summary (Signed)
[name], [name]                 ACCOUNT NO.:  [id number]   MEDICAL RECORD NO.:  [id number]          PATIENT TYPE:  INP   LOCATION:  1415                         FACILITY:  [facility]   PHYSICIAN:  Lucrezia Starch. Earlene Plater, M.D.  DATE OF BIRTH:  10-May-1960   DATE OF ADMISSION:  09/06/2005  DATE OF DISCHARGE:  09/08/2005                                 DISCHARGE SUMMARY   ADMISSION DIAGNOSIS:  Prostate and genitovesical abscess.   DISCHARGE DIAGNOSES:  1.  Resolution of prostate and genitovesical abscess.  2.  Positive drug screen.  3.  Hepatitis C.   BRIEF HISTORY:  [name] is a 50 year old African American male who  presented in clinic with testicular pain and swelling.  Had been on previous  antibiotics with no resolution of symptoms.  Symptoms have progressively  worsened.  CT exam performed in clinic revealed prostatic abscess with  involvement of right seminal vesical.  Patient's prostate was examined and  revealed hot firm prostate.  Ultrasound was performed which did reveal  abscessed area with subsequent drainage of abscess area by Dr. [name] C.  Ottelin in clinic with fluid sent for anaerobic and aerobic, Gram stain,  fungal cultures.   Patient was scheduled to enroll in rehab program, however, with recent  diagnosis, will need to postpone.  Patient has history of alcohol and crack  cocaine use.   ALLERGIES:  NO KNOWN DRUG ALLERGIES.   PAST MEDICAL HISTORY:  1.  Hypertension.  2.  Diabetes.  3.  Heparin C.  4.  Multiple substance abuse, drug of choice crack cocaine and ETOH.  5.  Patient is bipolar and history of suicide attempts. He had has      [facility] admissions several times.   PAST SURGICAL HISTORY:  None.   HOSPITAL COURSE:  Patient was admitted to [facility] emergently  under the care of Dr. Gaynelle Arabian on Sep 06, 2005, for IV antibiotics, also  had medicine consult for management of diagnoses of diabetes and  hypertension.  Consult was  obtained.  Hypertension at this point is well  controlled without medication.  Will continue to monitor to determine if  antihypertensive medications are needed.  Patient's A1c was 6.2.  Accu-Cheks  will be begun and continue to monitor glucose.  Elevated liver enzymes,  hepatitis studies will be checked.   History of alcohol abuse.  Patient currently demonstrates no active signs of  withdrawal, so Librium 50 mg p.o. q.8h. on a p.r.n. basis was begun.  Also  thiamine, folic acid and multivitamins.  Hyponatremia was also found and  patient is currently asymptomatic.  Medicine will continue to monitor.   Psychiatry is also consulted for history of bipolar with several medications  being prescribed.  Depakote 250 mg b.i.d. and 500 mg nightly, Seroquel 100  mg daily and increase by 100 mg per day to 400 mg nightly, Trazodone 100 mg  nightly.  Patient should follow up within one week of discharge with  Behavioral Health and patient was instructed to call immediately for any  suicidal distress or symptoms.  Gentamicin was started per pharmacy protocol.  Zosyn was also instituted at  3.375 g IV q.6h.   Initial lab work with wbc of 9.5, hemoglobin 16, hematocrit 47.5, platelets  33.9.  PT 13.7, INR 1 and PTT 29.  Sodium 134, potassium 3.7, BUN 11,  creatinine 0.9.  SGOT 58, SGPT 8, total protein 8.4, albumin 4, calcium 9.5.  Hemoglobin A1c 62.  Bilirubin total 1.1 and alkaline phosphatase 68.   Pain was managed with PCA morphine and doing well.  His recent RPR and  chlamydia were both negative of Sep 01, 2005.   Within 24 hours patient was having less pain.  Blood sugar 132, CMET stable.  __________ Zosyn and gentamicin were continued pending the cultures.  Discharge planned the next day.   September 08, 2005, is comfortable and voiding well.  Much less pain and swelling  in his testicle.  Tolerating diet, mentally stable.  He was instructed to  follow up in one to two weeks.  Cultures remain  pending.  He was discharged  home on Septra, Darvocet for pain, Seroquel, Depakote and Trazodone.   ACTIVITY:  No restrictions.   DIET:  No restrictions.   FOLLOW UP:  He is to follow up with Dr. Earlene Plater in one week.     ______________________________  Alessandra Bevels. Chase Picket, FNP-C      Ronald L. Earlene Plater, M.D.  Electronically Signed    JML/MEDQ  D:  10/09/2005  T:  10/09/2005  Job:  [id number]
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
## 2010-08-26 NOTE — H&P (Signed)
[facility]  Patient:    [name], [name] Visit Number: [id number] MRN: [id number]          Service Type: PSY Location: 400 0400 02 Attending Physician:  Rachael Fee Dictated by:   Candi Leash. Orsini, N.P. Admit Date:  02/10/2001                     Psychiatric Admission Assessment  IDENTIFYING INFORMATION:  This is a 50 year old single African-American male that was involuntarily committed on February 10, 2001, for intentional overdose suicide attempt.  HISTORY OF PRESENT ILLNESS:  The patient presents under commitment papers stating that the patient tried to kill himself with an overdose on crack cocaine.  The patient reports years of positive auditory hallucinations.  He stopped taking his medications for about a month so that the voices would intensify and tell him to hurt himself.  He reports that he "went to a place where no one knew me," a crack hotel.  He was smoking crack cocaine as much he could and took some sleeping pills that he obtained from an "entrepreneur." His intention was to die.  He was found by a Recruitment consultant.  He was taken to the emergency department.  The patient reports he is tired of being an outcast.  He reports auditory hallucinations for years, up to 20 years.  His sleeping has been fair.  He has lost about 10-15 pounds over 3 days.  He continues with auditory hallucinations, no visual hallucinations, with positive suicidal ideation with no specific plan, no homicidal ideation.  PAST PSYCHIATRIC HISTORY:  First visit to [facility].  History of bipolar disorder.  The patient attends [facility] on an outpatient basis.  He reportedly has been to [facility] in [place] about 4 to 5 months ago.  SOCIAL HISTORY:  He is a 50 year old single African-American male African-American male with no children.  He lives alone, apparently homeless, with a phone number here for [employer], [phone].  He is on  disability for his psychiatric illness.  He has completed the 12th grade.  He has no legal charges.  FAMILY HISTORY:  None.  ALCOHOL DRUG HISTORY:  He smokes.  He drinks an unspecified amount of beer. He uses crack cocaine as much as possible.  PAST MEDICAL HISTORY:  Primary care Tamana Hatfield patient states is [facility] in [place].  Medical problems are hypertension, type 2 diabetes, hepatitis C.  MEDICATIONS:  The patient was very unsure about his medication dosages.  He has been off his medications for at least 30 days.  He is on Prozac, lithium 2 in the morning, 1 at bedtime, Haldol 10 mg, Neurontin, Trazodone, Zyprexa 20 mg q.h.s., Atenolol 50 mg q.d. for blood pressure, amantadine 100 mg.  DRUG ALLERGIES:  No known allergies.  PHYSICAL EXAMINATION:  Performed at [facility] center.  Patient had a borderline EKG.  WBC count was 10, potassium 3.1, acetaminophen level was less than 10, salicylate level was less than 4.  CPK was 6.4.  Urine drug screen was positive for cocaine.  MENTAL STATUS EXAMINATION:  The patient was just mildly cooperative for interview, lying in bed with a blanket over his head, refused to get out of bed, showed me his face once.  Speech is very concrete, concrete answers, difficult to extract any information.  Mood is depressed and angry, affect is irritable.  Thought processes are positive auditory hallucinations, positive suicidal ideations, no homicidal ideation, no visual  hallucinations, no paranoia.  Cognitive, oriented x 3.  Memory is fair, judgment is poor, insight is poor, poor historian.  ADMISSION DIAGNOSES: Axis I:    1. Bipolar disorder, manic, suicide attempt.            2. Polysubstance abuse. Axis II:   Deferred. Axis III:  Hypertension, type 2 diabetes mellitus by history.  Patient            apparently not on any medications, hepatitis C. Axis IV:   Problems with social environment, housing, other psychosocial             problems. Axis V:    Current 30, estimated this past year 60.  INITIAL PLAN OF CARE:  Involuntary commitment to [facility] for intentional overdose, suicide attempt.  Contract for safety, check every 15 minutes.  The patient promises safety at this time.  Obtain labs.  We will resume Zyprexa and lithium, will check CBGs, will consider medicine consult if blood sugars are elevated.  Case worker to consider living arrangements for discharge planning.  Goal is to stabilize mood and thinking so patient can be safe, to be medication compliant, to remain drug free.  TENTATIVE LENGTH OF STAY:  Four to five days. Dictated by:   Candi Leash. Orsini, N.P. Attending Physician:  Rachael Fee DD:  02/11/01 TD:  02/12/01 Job: [id number]

## 2010-08-26 NOTE — Consult Note (Signed)
NAMEOLEN, EAVES                 ACCOUNT NO.:  [id number]   MEDICAL RECORD NO.:  [id number]          PATIENT TYPE:  INP   LOCATION:  1415                         FACILITY:  [facility]   PHYSICIAN:  Michaelyn Barter, M.D. DATE OF BIRTH:  [birth year]   DATE OF CONSULTATION:  DATE OF DISCHARGE:                                   CONSULTATION   [facility]   REASON FOR CONSULTATION:  Medical management.   PRIMARY CARE PHYSICIAN:  [employer], therefore, he is unassigned.   HISTORY OF PRESENT ILLNESS:  Mr. Drummonds is a 50 year old gentleman who was  admitted by the [facility].  He was seen earlier in the week  on Sep 01, 2005, at which time he complained of groin pain.  He was  diagnosed by the ER physician as having epididymitis.  Since that time he  has been diagnosed with both a prostate and seminal vesicle abscess; and he  underwent drainage of the abscess sometime over the past 24 hours.  Currently he complains of some nausea along with some subjective fevers and  night sweats.   PAST MEDICAL HISTORY:  1.  Psychiatric disorder requiring at least 4 previous admissions to the      [facility].  2.  Non-insulin-dependent diabetes mellitus.  3.  Hypertension.  4.  Hepatitis C.  5.  Bipolar disorder.  6.  Pyuria.  7.  Arthritis involving the legs, the patient's back and feet.   ALLERGIES:  No known drug allergies.   HOME MEDICATIONS:  The patient states that he has not taking any medications  over the last 8 months.   SOCIAL HISTORY:  Cigarettes:  The patient smokes approximately 4-5  cigarettes per day, Crack Cocaine:  The patient admits to using crack  cocaine.  He states that he started using crack cocaine at the age of 52.  He uses crack cocaine almost every day, according to the patient; and the  last time he used crack cocaine was 4 days ago.  Alcohol:  The patient  started drinking alcohol at the age of 13.  His last  consumption of alcohol  was 2 days ago.  He states that he drinks whatever he gets his hands on.   FAMILY HISTORY:  Mother has a history of hypertension, diabetes mellitus,  and arthritis.  Father:  The patient does not know who his father is.   REVIEW OF SYSTEMS:  As per HPI.   PHYSICAL EXAMINATION:  GENERAL:  The patient is awake.  He is cooperative.  He is in no obvious distress.  VITAL SIGNS:  Temperature 97.1, heart rate 101, respirations 16, blood  pressure 139/96.  HEENT:  Normocephalic, atraumatic, anicteric.  Extraocular movements are  intact.  NECK:  Supple.  No JVD, no lymphadenopathy.  Thyroid is not palpable.  CARDIAC:  S1-S2 is present, regular rate and rhythm.  No murmurs, gallops or  rubs.  RESPIRATORY:  No crackles or wheezes.  ABDOMEN:  Soft, positive tenderness of the left lower quadrant on the  patient's abdomen.  There is some positive  voluntary guarding.  Bowel sounds  are present.  EXTREMITIES:  No leg edema.  NEUROLOGIC:  The patient is alert and oriented x3.  MUSCULOSKELETAL:  5/5 upper and lower extremity strength.   LABS:  White blood cell count 9.5, hemoglobin 16.3, hematocrit 47.5,  platelets 339.  PT 13.7.  Sodium 134, potassium 2.7, chloride 101, CO2 22,  BUN 11, creatinine 0.9,  glucose 97.  SGOT 58, SGPT 80, total protein 8.4,  albumin 4.0, calcium 9.5, hemoglobin A1c 6.2, PT 13.7, INR 1.0, PTT 29.  Bilirubin total 1.1, alkaline phosphatase 68.   ASSESSMENT AND PLAN:  Mr. Cocuzza is a 50 year old gentleman admitted by the  urology service secondary to pain involving his groin which turned out to be  an abscess.  1.  Hypertension.  There is only 1 documented blood pressure recording      currently.  The patient's blood pressure is slightly elevated, but this      could be secondary to pain.  Will monitor the patient's blood pressure      over the next 24 hours, and based upon the recordings that are      documented by the nurses will consider  starting antihypertensive      medications at that point.  2.  History of diabetes mellitus.  There are currently no Accu-Checks      documented in the computer.  The patient's hemoglobin A1c is 6.2 which      is acceptable.  The goal is typically less than 7.  Will start Accu-      Checks and will reevaluate the patient's glucoses in the morning.      Depending upon the values may consider starting a diabetic medication.      Will also check a fasting lipid profile.  3.  Elevated liver enzymes.  The source of this is questionable.  This could      be secondary to the patient's history of alcohol abuse, although the      profile does not fit the typical 2:1 AST to ALT ratio, that one would      typically see if there was liver damage secondary to alcohol abuse.      Hepatitis may also have been the culprit of the patient's current      elevation of his AST and ALT. Will check hepatitis studies. Will      consider ultrasound of the patient's liver.  4.  History of alcohol abuse.  The patient currently demonstrates no active      signs of withdrawal.  Will provide Librium 50 mg p.o. q.8 h.  on a      p.r.n. basis for any signs of agitation. Will also start thiamin, folic      acid and multivitamin.  If the patient does demonstrate signs of alcohol      withdrawal would then initiate alcohol withdrawal protocol.  5.  History of crack cocaine abuse. Will monitor for now.  6.  Hyponatremia.  This appears to be mild and the patient is currently      asymptomatic secondary to this.  Will monitor this likewise.      Michaelyn Barter, M.D.  Electronically Signed     OR/MEDQ  D:  09/06/2005  T:  09/06/2005  Job:  [id number]
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
## 2010-08-26 NOTE — H&P (Signed)
NAMEDINK, CREPS                 ACCOUNT NO.:  [id number]   MEDICAL RECORD NO.:  [id number]          PATIENT TYPE:  IPS   LOCATION:  0404                          FACILITY:  [facility]   PHYSICIAN:  Syed T. Arfeen, M.D.   DATE OF BIRTH:  Jun 24, 1960   DATE OF ADMISSION:  09/10/2004  DATE OF DISCHARGE:                         PSYCHIATRIC ADMISSION ASSESSMENT   IDENTIFYING INFORMATION:  [name] is a 50 year old single African-  American male who was admitted voluntarily to the [facility]  on September 10, 2004.   HISTORY OF PRESENT ILLNESS:  The patient reports he has been off his  medications for 2 days.  He became agitated with some people who were asking  him stupid questions.  He wanted to hurt these people so he went to the  emergency department for help.  He also reports some auditory  hallucinations.  He denies any suicide ideations or visual hallucinations.   PAST PSYCHIATRIC HISTORY:  He has been hospitalized 4 times prior to this  hospital at the [facility].  He is followed on the outpatient  basis by the [facility].   SOCIAL HISTORY:  The patient was rather uncooperative and a good social  history was unable to be obtained.  We do know that he was a former [employer].   FAMILY HISTORY:  Again, the patient was uncooperative, so we were unable to  obtain a family history.   ALCOHOL AND DRUG HISTORY:  According to his chart, he uses alcohol  occasionally, not to excess, but he does smoke crack cocaine occasionally.   PAST MEDICAL HISTORY:  His primary care Saquoia Sianez is listed as Dr. Allyne Gee at  [facility].  His medical problems include non-insulin-dependent  diabetes mellitus, hypertension, and hepatitis C.   MEDICATIONS:  1.  Depakote ER 1000 mg daily.  2.  Trazodone 100 mg at bedtime.  3.  Seroquel 100 mg at bedtime.   DRUG ALLERGIES:  He has no known drug allergies.   POSITIVE PHYSICAL FINDINGS:  His physical examination was performed at  [facility].  At the [facility], in general he  is 5 feet 7.5 inches tall and 219 pounds.  His vital signs, temperature  97.5, pulse 83, respirations 18, blood pressure 113/73.  His last CBG was  118.   LABORATORY DATA:  CBC was within normal limits.  Blood chemistries were  within normal limits except for a mildly elevated glucose.  His liver  function tests:  He had an elevated AST of 123 and ALT of 256.  His alcohol  level was less than 5.  Urinalysis was negative.  TSH was pending.  Urine  drug screen is also pending.   MENTAL STATUS EXAM:  Alert and oriented x4.  He had poor eye contact with  restricted affect.  His appearance was casual, his behavior was agitated and  uncooperative.  His speech was clear with even pace and tone.  His mood was  agitated.  His thought process:  He was angry with positive homicidal  ideation, no suicidal ideation, no  evidence of auditory or visual  hallucinations was observed, and he did appear to be manic.  His cognitive  function:  His concentration was normal, memory is intact, his insight is  poor, his impulse control is poor.   ADMISSION DIAGNOSIS:  AXIS I:  Bipolar disorder, manic episode.  Polysubstance abuse.  AXIS II:  Deferred.  AXIS III:  Non-insulin-dependent diabetes mellitus, hypertension, hepatitis  C.  AXIS IV:  Unknown due to uncooperative nature of the patient.  AXIS V:  Current global assessment of function of 26 with past year of 60.   INITIAL PLAN OF CARE:  Admit the patient voluntarily, stabilize him to mood  and thought.  We will resume his medications, work to increase his coping  skills, decrease his stressors.  He is to follow up with the [facility] in  [place] or [place].   ESTIMATED LENGTH OF STAY:  5-9 days.       AHW/MEDQ  D:  09/11/2004  T:  09/11/2004  Job:  [id number]

## 2010-08-26 NOTE — Discharge Summary (Signed)
[facility]  Patient:    [name], [name] Visit Number: [id number] MRN: [id number]          Service Type: PSY Location: 400 0401 02 Attending Physician:  Rachael Fee Dictated by:   Jeanice Lim, M.D. Admit Date:  02/10/2001 Discharge Date: 03/04/2001                             Discharge Summary  IDENTIFYING DATA:  This a 50 year old single African-American male voluntarily committed on November 3 for intentional overdose as a suicide attempt.  ADMISSION MEDICATIONS:  Patient was unsure about his medication doses, has been off his medications for at least 30 days.  He was previously on Prozac, lithium, Haldol, Neurontin, trazodone, Zyprexa, Atenolol, and amantadine.  ALLERGIES:  No known drug allergies.  PHYSICAL EXAMINATION:  Essentially within normal limits, vital signs stable, afebrile, neurologically nonfocal.  ROUTINE ADMISSION LABS:  Showed a borderline but no acute change on EKG. Potassium was low at 3.1.  Acetaminophen level less than 10, salicylate less than 4.  CPK 6.5.  Urine drug screen positive for cocaine.  MENTAL STATUS EXAMINATION:  The patient was alert, partially cooperative, lying in bed with a blanket over his head, refusing to get out of bed.  Speech was concrete, difficult to extract information.  Mood depressed and angry, affect irritable.  Thought process positive for auditory hallucinations and suicidal ideation, no homicidal ideation.  Cognitively grossly intact. Judgment and insight poor.  ADMISSION DIAGNOSES: Axis I:    1. Bipolar disorder, manic, status post suicide attempt.            2. Polysubstance abuse. Axis II:   None. Axis III:  Hypertension and diabetes type 2 and history of hepatitis C. Axis IV:   Moderate, problems with environment and housing. Axis V:    30/60.  HOSPITAL COURSE:  The patient was admitted with routine p.r.n. medications. Previous psychiatric medications were resumed.  He was  started also on Depakote in addition to Haldol.  Zyprexa was titrated as tolerated for psychotic symptoms and lithium optimized.  Neurontin initially started was tapered and stopped to minimize side effects and due to history of responding to previous medications.  PPD was placed and Neurontin was then restarted and titrated which the patient clearly tolerated well with strong and positive response to medication changes.  CONDITION ON DISCHARGE:  Markedly improved.  Mood was more stable, mostly euthymic.  Affect bright, thought process goal directed.  Thought content negative for psychotic symptoms.  There was no dangerous ideations and judgment and insight had improved.  DISCHARGE MEDICATIONS: 1. Ultram 100 mg q.8h. p.r.n. pain. 2. Cogentin 1 mg q.h.s. 3. Lithium 300 2 q.a.m. and 3 q.h.s. 4. Zyprexa 20 mg q.h.s. 5. Neurontin 300 mg q.a.m., q.3p. and 3 q.h.s. 6. Trazodone 100 mg q.h.s.  DISPOSITION:  Patient was advised not to drive if sedated and to follow up for psychiatric medication monitoring and with his primary care physician.  He was to go straight to the [facility] for followup there, and to remain abstinent from drugs and follow up with  [facility] and all substance abuse treatment resources available.  DISCHARGE DIAGNOSES: Axis I:    1. Bipolar disorder, manic, status post suicide attempt.            2. Polysubstance abuse. Axis II:   None. Axis III:  Hypertension and diabetes type 2 and history of hepatitis C.  Axis IV:   Moderate, problems with environment and housing. Axis V:    Global assessment of function on discharge was 60. Dictated by:   Jeanice Lim, M.D. Attending Physician:  Rachael Fee DD:  04/16/01 TD:  04/16/01 Job: [id number]
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
## 2010-08-26 NOTE — H&P (Signed)
NAMEGILMER, KAMINSKY                 ACCOUNT NO.:  [id number]   MEDICAL RECORD NO.:  [id number]          PATIENT TYPE:  EMS   LOCATION:  ED                           FACILITY:  [facility]   PHYSICIAN:  Isidor Holts, M.D.  DATE OF BIRTH:  Jul 19, 1960   DATE OF ADMISSION:  05/19/2006  DATE OF DISCHARGE:                              HISTORY & PHYSICAL   PRIMARY CARE PHYSICIAN:  [facility] [place].  The patient is unassigned to  [place].   CHIEF COMPLAINT:  Boils both armpits for the past 1-1/2 weeks.   HISTORY OF PRESENT ILLNESS:  This is a 50 year old male known diabetic.  For past medical history otherwise, see below.  According to the  patient, for the past 1-1/2 weeks, he has noticed boils in both armpits,  1 on the left side and about 4 on the right side, and these have been  gradually getting larger and more painful.  In addition, on May 18, 2006, in the evening, he has developed chest pain which is aggravated by  deep inspiration and also on attempts to move around.  Denies abdominal  pain, denies vomiting or diarrhea, however, has been constipated for the  past 2 days.  The patient states that he has been compliant with his  diabetic medication and in the past week or so, his blood sugars have  been ranging between 114-119.  He has also had fever, chills, and night  sweats and in the past 1 week, has developed urinary frequency which in  the first 4 days, was associated with dysuria, and he has noticed some  blood in his urine.  Denies flank pain.  The patient underwent I&D by  Dr. Wenda Low, general surgeon, in the emergency department and was  subsequently referred to medical service for admission.   PAST MEDICAL HISTORY:  1. Bipolar disorder, status post at 4 previous admissions to      [facility].  2. Type 2 diabetes mellitus.  3. Hypertension.  4. Hepatitis C.  5. Rheumatoid arthritis.   MEDICATIONS:  1. Lisinopril.  2. Amantidine 100 mg p.o. b.i.d.  3.  Norvasc 5 mg p.o. daily.  4. Metformin 500 mg p.o. b.i.d.   According to the patient, this medication list is incomplete as it  stands.  He does state that he was started fairly recently on some new  medications at the [facility], and he will update this, as soon as  possible.   ALLERGIES:  No known drug allergies.   SYSTEMS REVIEW:  Essentially as per HPI and chief complaint.   SOCIAL HISTORY:  The patient is single, smokes approximately 5  cigarettes per day, has been smoking since age 66 years.  He used to  drink rather heavily, but states that he has cut down drastically, and  his last drink was about 1 week ago.  He used to use crack cocaine, says  he does not use this anymore; however, he still utilizes marijuana  occasionally.   FAMILY HISTORY:  The patient has 2 brothers, one of whom is hypertensive  and diabetic.  The other is hypertensive.  He has 1 sister who has  bone problems.  The patient's mother has hypertension, diabetes  mellitus, and arthritis.  His father's health is unknown.   PHYSICAL EXAMINATION:  VITAL SIGNS:  Temperature 97.6, pulse 79 per  minute, regular.  Respiratory rate 16, BP 96/70 mmHg.  Pulse oximetry  99% on room air.  The patient does not appear to be obvious acute  discomfort, following I&D in the emergency department.  HEENT:  No clinical pallor, no jaundice, no conjunctival injection.  Throat is clear.  NECK:  Supple, JVP not seen, no palpable lymphadenopathy or goiter.  CHEST:  Clear to auscultation.  No wheezing, no crackles.  HEART:  Sounds 1 and 2 heard.  Normal, regular, no murmurs.  The patient  has warm compresses both axillary regions.  He has 3 drained abscesses  in the right axilla and 1 drained abscess in the left axilla, surrounded  by perifocal erythema and swelling.  ABDOMEN:  Moderately obese, soft, and nontender.  No palpable  organomegaly, no palpable masses, normal bowel sounds.  LOWER EXTREMITY EXAMINATION:  No  pitting edema, palpable peripheral  pulses.  MUSCULOSKELETAL:  Quite unremarkable.  CENTRAL NERVOUS SYSTEM:  No focal neurologic deficits on gross  examination.   INVESTIGATIONS:  CBC:  WBC 9.6, hemoglobin 16.8, hematocrit 42.2,  platelets 333.  Electrolytes:  Sodium 134, potassium 3.7, chloride 101,  CO2 23, BUN 10, creatinine 0.9, glucose 109, AST 75.  EKG dated May 19, 2006, showed sinus rhythm, regular, 89 per minute, normal axis.  No  acute ischemic changes.   ASSESSMENT/PLAN:  1. Bilateral axillary abscesses.  These have been incised and drained      in the emergency department by Dr. Wenda Low, surgeon.  We shall      request wound management team to continue dressings.  Meanwhile, we      shall admit the patient, institute IV antibiotics, i.e., Vancomycin      and do blood cultures for completeness.   1. Dysuria and possible hematuria.  We shall check urinalysis, do      renal ultrasound to rule out urolithiasis, or other lesion.   1. Type 2 diabetes mellitus.  This appears controlled.  We shall check      HBA1C for completeness, place the patient on sliding-scale insulin      coverage, continue Metformin, and carbohydrate-modified diet.   1. Hypotension.  Blood pressure is low normal at present.  This is      likely secondary to volume depletion, and we shall institute      intravenous fluids.   1. Constipation.  We shall institute Colace.   1. Chest pain.  This is likely related to the patient's abscesses,      certainly does not follow the pattern of ischemic heart disease, is      worsened by deep inspiration and movement.  We shall therefore do      chest x-ray for completeness.   1. Mood.  This is stable.  The patient will continue pre-admission      psychotropic medications, when known.   Further management will depend on clinical course.      Isidor Holts, M.D.  Electronically Signed    CO/MEDQ  D:  05/19/2006  T:  05/19/2006  Job:  [id number]

## 2010-08-26 NOTE — H&P (Signed)
[facility]  Patient:    RAYDEL, HOSICK Visit Number: [id number] MRN: [id number]          Service Type: PSY Location: 500 [id number] [id number] Attending Physician:  Jeanice Lim Dictated by:   Candi Leash. Orsini, N.P. Admit Date:  10/05/2001                     Psychiatric Admission Assessment  IDENTIFYING INFORMATION:  This is a 50 year old single white male voluntarily admitted on October 05, 2001.  HISTORY OF PRESENT ILLNESS:  The patient presents with a history of psychosis and manic behavior.  He was brought to the emergency department per the police, complaining of hallucinations with a plan to kill himself by smoking crack cocaine.  The patient reports that he has been off his medications for at least one month.  States it was interfering with his job.  His chart reports that the patient has been upset at home, having conflict with his girlfriend, smoking cocaine and pot, which is unusual behavior, as stated, for this patient.  The patient denies any drug use.  He reports he has had no sleep for the past four nights.  He was feeling very "manic."  Experiencing positive auditory hallucinations to hurt himself.  The patient does have a history of an overdose approximately one year ago.  States he has a 10-pound weight loss.  He reports that he is currently hearing voices but does promise safety on the unit for any suicidal ideation.  He denies any visual hallucinations or paranoid feelings, reporting positive homicidal ideation towards people in general.  PAST PSYCHIATRIC HISTORY:  Second hospitalization to [facility]. He has had 4-5 suicide attempts.  He is an outpatient at [facility] with no recent visits.  SOCIAL HISTORY:  This is a 50 year old single African-American male.  He has no children.  He works at [employer].  He has completed high school.  States he is possibly homeless.  FAMILY HISTORY:   Unknown.  ALCOHOL/DRUG HISTORY:  The patient smokes.  He denies any alcohol or substance abuse.  PRIMARY CARE [place]:  Unknown.  MEDICAL PROBLEMS:  Hypertension and diabetes.  MEDICATIONS:  The patient reports he has been on lithium, Prozac and possibly Glucophage in the past.  Does not know the dosages and does not remember exactly where he has been getting these prescriptions.  DRUG ALLERGIES:  No known allergies.  PHYSICAL EXAMINATION:  Performed at [facility] Emergency Department.  LABORATORY DATA:  Alcohol level is less than 5.  Urine drug screen was positive for cocaine, positive for THC.  Potassium is 3.4.  SGOT 103, SGPT 143.  MENTAL STATUS EXAMINATION:  The patient is in the bed.  He is calm.  Little eye contact.  He is polite.  Speech is clear.  Mood is depressed.  Affect is in bed, somewhat flat.  Thought processes are coherent.  The patient endorses positive hallucinations.  Cognitive:  The patient is oriented.  His judgment is poor.  Insight is poor.  His reliability is questionable.  DIAGNOSES: Axis I:    Bipolar disorder, mixed. Axis II:   Deferred. Axis III:  None. Axis IV:   Problems with primary support group, occupation, housing and other            psychosocial problems. Axis V:    Current 30; this past year 65-70.  PLAN:  Voluntary admission for psychosis and depression.  Contract for safety. Check  every 15 minutes.  Will obtain labs.  Will resume his lithium and Depakote as a mood stabilizer.  Haldol and Ativan will be available for agitation.  The patient to attend groups, follow up with mental health, to be medication-compliant, to remain alcohol and drug-free.  TENTATIVE LENGTH OF STAY:  Three to five days or more depending on patients living arrangements and response to medication. Dictated by:   Candi Leash. Orsini, N.P. Attending Physician:  Jeanice Lim DD:  10/09/01 TD:  10/09/01 Job: [id number]

## 2010-08-26 NOTE — Discharge Summary (Signed)
NAME:  [name], [name]                           ACCOUNT NO.:  [id number]   MEDICAL RECORD NO.:  [id number]                   PATIENT TYPE:  IPS   LOCATION:  0303                                 [facility]ILITY[facility]  [facility]   PHYSICIAN:  Jeanice Lim, M.D.              DATE OF BIRTH:  05-02-1960   DATE OF ADMISSION:  07/24/2002  DATE OF DISCHARGE:  07/30/2002                                 DISCHARGE SUMMARY   IDENTIFYING DATA:  This is a 50 year old African-American male, single,  voluntarily admitted.  Reporting drinking a 12-pack of beer daily and liquor  on top of that.  Also had relapsed on cocaine.  Had been throwing furniture  from his house out into the yard.  Fiance and 54-month-old daughter had died  in 2022-05-31 in a motor vehicle accident and patient had relapsed as well as  not taking his medications for bipolar disorder.   MEDICATIONS:  None.  The patient had been on no medications for several  months.   ALLERGIES:  No known drug allergies.   PHYSICAL EXAMINATION:  Essentially within normal limits.  Neurologically  nonfocal.   LABORATORY DATA:  CBC within normal limits.  Slight elevation of SGOT at 49,  SGPT of 83.  Alcohol level was less than 5.   MENTAL STATUS EXAM:  Fully alert male in no acute distress.  Cooperative.  Speaking about the death of his fiance, somewhat incongruent smile on face  at times when speaking.  Speech within normal limits.  Mood a bit guarded,  possibly paranoid.  Thought process mostly goal directed with vague suicidal  ideation.  Endorsed hopelessness and helplessness, feeling that he was  trying to kill himself with drugs.  Was motivated or willing to become sober  and compliant with medications.  Cognition was intact.  Judgment and insight  poor with poor history of impulse control.   ADMISSION DIAGNOSES:   AXIS I:  1. Bipolar disorder not otherwise specified, depressed-phase.  2. Polysubstance abuse.  3. Alcohol dependence.   AXIS II:   Deferred.   AXIS III:  1. Pyuria, type 2.  2. Diabetes mellitus.  3. Hypertension by history.   AXIS IV:  Moderate (stress from limited support system and grief over loss  of child and girlfriend).   AXIS V:  35/60.   HOSPITAL COURSE:  The patient was admitted and ordered routine p.r.n.  medications and underwent further monitoring.  Was screened for STDs.  Was  encouraged to participate in individual, group and milieu therapy including  substance abuse treatment.  Was placed on a low dose Librium detox protocol.  Trazodone and Seroquel p.r.n.  After discussing medications for bipolar  disorder, he agreed to restart lithium and Seroquel.  Reported gradual  improvement in mood.  Mood became more stable, less dysphoric and his sleep  improved.   CONDITION ON DISCHARGE:  Markedly improved.  Mood was  more euthymic.  Affect  brighter.  Thought processes goal directed.  Thought content negative for  dangerous ideation and psychotic symptoms.  The patient reported motivation  to be abstinent.   DISCHARGE MEDICATIONS:  1. Seroquel 300 mg q.h.s.  2. Lithium carbonate 300 mg, 2 q.h.s.  3. Trazodone 100 mg q.h.s. p.r.n. insomnia.   FOLLOW UP:  The patient to follow up at the [facility] 8:30 a.m. on July 31, 2002.   DISCHARGE DIAGNOSES:   AXIS I:  1. Bipolar disorder not otherwise specified, depressed-phase.  2. Polysubstance abuse.  3. Alcohol dependence.   AXIS II:  Deferred.   AXIS III:  1. Pyuria, type 2.  2. Diabetes mellitus.  3. Hypertension by history.   AXIS IV:  Moderate (stress from limited support system and grief over loss  of child and girlfriend).   AXIS V:  Global Assessment of Functioning on discharge 55.                                               Jeanice Lim, M.D.    JEM/MEDQ  D:  08/21/2002  T:  08/21/2002  Job:  [id number]

## 2010-08-26 NOTE — Discharge Summary (Signed)
[name], [name]                 ACCOUNT NO.:  [id number]   MEDICAL RECORD NO.:  [id number]          PATIENT TYPE:  IPS   LOCATION:  0404                          [facility]ILITY[facility]  [facility]   PHYSICIAN:  Geoffery Lyons, M.D.      DATE OF BIRTH:  04-14-1960   DATE OF ADMISSION:  09/10/2004  DATE OF DISCHARGE:  09/20/2004                                 DISCHARGE SUMMARY   CHIEF COMPLAINT/HISTORY OF PRESENT ILLNESS:  This was the second recent  admission to [facility] for this 50 year old single  African-American male admitted on October 10, 2004.  He reported he has been  off his medication for 2 days.  He has become agitated with some people who  were asking him stupid questions.  He wanted to hurt these people, so he  went to the emergency department for help.  He reported auditory  hallucinations, denied any suicidal ideations or visual hallucinations.   PAST PSYCHIATRIC HISTORY:  He has been hospitalized four times at [name].  Followup on an outpatient basis was at the [facility].   ALCOHOL AND DRUG HISTORY:  He uses also occasionally, claims not to excess,  but he does smoke crack cocaine occasionally.   PAST MEDICAL HISTORY:  1.  Non-insulin-dependent diabetes mellitus.  2.  Hypertension.  3.  Hepatitis C.   MEDICATIONS:  1.  Depakote ER 1000 mg at night.  2.  Trazodone 100 at night.  3.  Seroquel 100 at night.   PHYSICAL EXAMINATION:  Performed and failed to show any acute findings.   LABORATORY WORKUP:  CBC within normal limits.  TSH 1.968.  Blood chemistry  within normal limits.  SGOT 123.  SGPT 256.  Alcohol level less than 5.   MENTAL STATUS EXAM:  Reveals an alert, cooperative male, poor eye contact,  restricted affect, casual appearance.  At times he was agitated and could  become uncooperative.  Speech was clear with even pace and tone.  Mood was  anxious and agitated.  Thought process angry, positive homicidal ideas, no  suicidal  ideas, no evidence of auditory or visual hallucinations.  Quite  irritable at times.  Could build up to having pressured speech.  Cognitive  function preserved.   ADMISSION DIAGNOSES:   AXIS I:  1.  Bipolar disorder.  2.  Cocaine abuse.   AXIS II:  No diagnosis.   AXIS III:  1.  Non-insulin-dependent diabetes mellitus.  2.  Hypertension.  3.  Hepatitis C.   AXIS IV:  Moderate.   AXIS V:  Upon admission 26, highest global assessment of function in the  last year 60.   COURSE IN HOSPITAL:  He was admitted and started in individual and group  psychotherapy.  He was given Depakote ER 500 mg 2 at night, glyburide 5 mg  daily, trazodone 100 mg at night, Protonix 40 mg daily, Plendil SR 5 mg  daily, folic acid 1 mg daily, Lisinopril 20 mg daily, Seroquel 100 at night.  He was given Ativan as needed and __________ q.8h. as needed for  anxiety.  He was experiencing migraine headache.  He was given Midrin.  Depakote was  increased to 250 mg twice a day and 1000 at night.  Seroquel was placed at  200 at bedtime.  He did endorse that he had a lot of difficulty off his  medication, more agitation, felt like he was losing control.  On evaluation  he was not spontaneous.  He was in bed, somewhat sedated, slow processing.  Positive for feeing out of control, wanting help.  We continued to work with  the Seroquel and the Depakote.  He did endorse that he got into a big  argument with the brother and the sister.  Endorsed that he was not going to  go back to stay with them.  He would need to find another place.  Endorsed a  hard time with increased depression, migraine headache.  He claimed he was  not relapsed on drugs.  He was placed back on his medications.  He endorsed  feeling overwhelmed.  There was no communication from the sister and the  brother, so he felt that he was not going to be able to go back with them.  He was isolating in the unit as he felt he did not want to get into  trouble  with other people as he felt it was easy to lose control.  He endorsed  anger, rage, afraid to go off.  Sleep was an issue.  He endorsed being very  irritable, agitated.  We continued to adjust the dosages.  He continued to  endorse that he was afraid to lose control.  We increased Seroquel to 300 mg  at bedtime.  He continued to admit to the anger and the irritability, fear  of losing control.  Still isolating.  We increased the Seroquel further to  400 mg.  By September 17, 2004 he said he was experiencing benefit from the  medication.  Seroquel and trazodone were helping with sleep.  We continued  to work with these medications and work on Building surveyor.  Sleep was  still an issue in part because of roommate, did not require a medication  adjustment.  On September 19, 2004, he continued to improve.  He was going to go  into a group home.  He was encouraged and motivated with this plan.  On September 20, 2004, he said he was better, endorsed he was ready to go, no suicidal  and no homicidal ideas, no hallucinations.  Mood was improved enough, felt  anger controlled that he felt he could handle it on an outpatient basis.   DISCHARGE DIAGNOSES:   AXIS I:  1.  Bipolar disorder with psychotic features.  2.  Polysubstance abuse in remission.   AXIS II:  No diagnosis.   AXIS III:  1.  Non-insulin-dependent diabetes mellitus.  2.  Hypertension.  3.  Hepatitis C.   AXIS IV:  Moderate.   AXIS V:  Upon discharge global assessment of function 50-55.   DISCHARGE MEDICATIONS:  1.  Trazodone 100 mg at night.  2.  Folic acid 1 mg daily.  3.  Glyburide 5 mg daily in the morning.  4.  Prinivil 20 mg daily.  5.  Protonix 40 mg daily.  6.  Depakote ER 250 mg twice a day and 1000 at night.  7.  Plendil 500 mg daily.  8.  Seroquel 200 mg 2 at night.   FOLLOW UP:  Dr. Mikey Bussing at [facility].  IL/MEDQ  D:  10/11/2004  T:  10/11/2004  Job:  [id number]

## 2010-08-26 NOTE — Consult Note (Signed)
[name], [name]                 ACCOUNT NO.:  [id number]   MEDICAL RECORD NO.:  [id number]          PATIENT TYPE:  EMS   LOCATION:  ED                           FACILITY:  [facility]   PHYSICIAN:  Thornton Park. Daphine Deutscher, MD  DATE OF BIRTH:  12-07-1960   DATE OF CONSULTATION:  05/19/2006  DATE OF DISCHARGE:                                 CONSULTATION   CHIEF COMPLAINT:  Bilateral axillary abscesses, right worse than left.   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  Over the last week, he has developed boils  beneath his arms, right worse than left.  These are exquisitely tender  and on the ER form that I filled out, I drew pictures of these and where  they were lanced.  They were from the __________ arm beside of the  axilla fairly high and across the mid portion of the axilla then down to  the lower portion of the axilla.  There were 4 areas on the right side.  Then there was 1 real long area on the left side.  I was able to inject  these with some lidocaine and using an 11 blade, opened then up to where  the purulent material began to drain.  He had been given some vancomycin  intravenously before this.   I then scrubbed both areas with Hibiclens.  I previously just used a  little of Betadine on all the sites, but I used Hibiclens to scrub the  blood, pus, and then I put some warm compresses on each arm to try to  facilitate drainage.   RECOMMENDATIONS:  Possible consider for admission, diabetes management,  IV antibiotics if in the hospital or doxycycline if he is an outpatient.  The patient will at a minimum need b.i.d. showers and washing and p.o.  antibiotics and dressings to try to control this drainage.   IMPRESSION:  Bilateral methicillin-resistant Staphylococcus aureus  abscesses, right worse than left.      Thornton Park Daphine Deutscher, MD  Electronically  Signed     MBM/MEDQ  D:  05/19/2006  T:  05/19/2006  Job:  [id number]

## 2010-10-26 NOTE — Assessment & Plan Note (Signed)
[name], [name]                 ACCOUNT NO.:  [id number]  MEDICAL RECORD NO.:  [id number]           PATIENT TYPE:  I  LOCATION:  0305                          FACILITY:  BH  PHYSICIAN:  [name], MD       DATE OF BIRTH:  DATE OF ADMISSION:  08/16/2010 DATE OF DISCHARGE:                      PSYCHIATRIC ADMISSION ASSESSMENT   HISTORY OF PRESENT ILLNESS:  The patient is a 50 year old African American male well known to this Clinical research associate from previous visits to the [facility].  He presented with a chief complaint of suicidal ideation reporting that he had been hallucinating, hearing voices with suicidal ideation homicidality.  He told the emergency room he had thoughts of strangling other people.  PAST PSYCHIATRIC HISTORY:  Includes multiple admissions in the past to [facility] with his most recent being in September 2011, where he was treated for  polysubstance induced psychosis.  Past psychiatric history is significant for admission to behavioral health as well as the [facility] in [place].  He has in January [facility]  February 2010, [facility] in June 2006, [facility] 2011, [facility] and [name].  He says he is there for an outpatient being followed for bipolar disorder and substance abuse.  Currently the patient is homeless for approximately a month  and reports no family.  PAST MEDICAL HISTORY:  Significant for alcohol abuse, anxiety, bipolar disorder, depression, diabetes and possible rheumatoid arthritis as well as hypertension and chronic back pain, gastroesophageal reflux disease.  ALCOHOL AND DRUG HISTORY:  he patient cannot provide his chronic quantity of alcohol recently and he relates that his drug history he states that he has been using 100 dollars worth of cocaine a day for the last several weeks.  History in the chart reveals that he has been smoking marijuana one a day for years  and benzo's  1 to 2 a day for months, barbiturates several pills a day for months.  Alcohol, again is not quantified.  CURRENT PRIMARY CARE:  The patient states none.  CURRENT PSYCHIATRIST:  He says he goes to the [name] in [place] but cannot remember his last visit.  Evaluation the emergency room was provided by Ernie Hew, PA, significant exam results revealed no  problems and was unremarkable.  Significant laboratories reveal a CBC  with differential count within normal limits.  Urine drug screen was positive for cocaine, benzodiazepines, cannabis and barbiturates.  Acetaminophen level was less than 15.  Alcohol level was reported as high and the patient was comprehensive metabolic profile, glucose of 102, SGOT 99, SGPT elevated at 214.  Remainder was negative.  The patient reported hearing auditory hallucinations and feeling suicidal and homicidal toward no one in particular.  MENTAL STATUS EXAM:  Mental status exam today  the patient is a well- developed, well-nourished, African American male wearing paper hospital scrubs who appears his chronologically stated age in no apparent distress.  He is pacing around the room with his eyes closed, does not make eye contact and speech is minimal. When sitting on the bed the patient  has 2+ hypermotor  activity with bouncing of his feet and pacing restlessly.  He states he is not oriented to his location or place. Says he does hear voices and that started a day and a half ago.  He does see things which started a day and a half ago.  He reports that his suicidality and homicidality started a day and a half ago.  Mood is difficult to assess secondary to the patient's poor eye contact and avoidance to engage with the examiner by going to the bathroom, pacing, walking away and stating that he wants to sleep.  His speech is a limited and again he is reporting that he is sleepy and would like to go to sleep.  He is respectful and polite,  ending each sentence in ma'am.  ASSESSMENT:  Polysubstance abuse.  Rule out substance-induced psychosis. Alcohol abuse. AXIS II:  Negative. AXIS III:  Hypertension, arthritis, chronic back pain, gastroesophageal reflux disease, diet controlled diabetes and medical noncompliance. AXIS IV:  Chronic homelessness.  The burden of chronic substance abuse. Current GAF 40, last year unknown.  PLAN:  The patient will be admitted and stabilized for possible transfer to [facility] hospital.  Estimated length of stay 1 to 3 days.    ______________________________ [name], PA   ______________________________ [name], MD    NM/MEDQ  D:  08/16/2010  T:  08/16/2010  Job:  [id number]  Electronically Signed by [name]  on 10/24/2010 03:54:11 PM Electronically Signed by Nelly Rout MD on 10/26/2010 11:42:31 AM

## 2012-02-23 ENCOUNTER — Encounter (HOSPITAL_COMMUNITY): Payer: Self-pay | Admitting: *Deleted

## 2012-02-23 ENCOUNTER — Emergency Department (HOSPITAL_COMMUNITY): Payer: PRIVATE HEALTH INSURANCE

## 2012-02-23 ENCOUNTER — Inpatient Hospital Stay (HOSPITAL_COMMUNITY)
Admission: EM | Admit: 2012-02-23 | Discharge: 2012-02-25 | DRG: 918 | Disposition: A | Discharge status: Home / Self Care | Payer: PRIVATE HEALTH INSURANCE | Attending: Internal Medicine | Admitting: Internal Medicine

## 2012-02-23 DIAGNOSIS — T4271XA Poisoning by unspecified antiepileptic and sedative-hypnotic drugs, accidental (unintentional), initial encounter: Principal | ICD-10-CM | POA: Diagnosis present

## 2012-02-23 DIAGNOSIS — F319 Bipolar disorder, unspecified: Secondary | ICD-10-CM | POA: Diagnosis present

## 2012-02-23 DIAGNOSIS — E119 Type 2 diabetes mellitus without complications: Secondary | ICD-10-CM

## 2012-02-23 DIAGNOSIS — R000 Tachycardia, unspecified: Secondary | ICD-10-CM | POA: Diagnosis present

## 2012-02-23 DIAGNOSIS — F32A Depression, unspecified: Secondary | ICD-10-CM | POA: Diagnosis present

## 2012-02-23 DIAGNOSIS — E785 Hyperlipidemia, unspecified: Secondary | ICD-10-CM | POA: Diagnosis present

## 2012-02-23 DIAGNOSIS — Y92009 Unspecified place in unspecified non-institutional (private) residence as the place of occurrence of the external cause: Secondary | ICD-10-CM

## 2012-02-23 DIAGNOSIS — F17200 Nicotine dependence, unspecified, uncomplicated: Secondary | ICD-10-CM | POA: Diagnosis present

## 2012-02-23 DIAGNOSIS — Z79899 Other long term (current) drug therapy: Secondary | ICD-10-CM

## 2012-02-23 DIAGNOSIS — B1920 Unspecified viral hepatitis C without hepatic coma: Secondary | ICD-10-CM | POA: Diagnosis present

## 2012-02-23 DIAGNOSIS — E559 Vitamin D deficiency, unspecified: Secondary | ICD-10-CM | POA: Diagnosis present

## 2012-02-23 DIAGNOSIS — K219 Gastro-esophageal reflux disease without esophagitis: Secondary | ICD-10-CM | POA: Diagnosis present

## 2012-02-23 DIAGNOSIS — R319 Hematuria, unspecified: Secondary | ICD-10-CM | POA: Diagnosis present

## 2012-02-23 DIAGNOSIS — D72829 Elevated white blood cell count, unspecified: Secondary | ICD-10-CM | POA: Diagnosis present

## 2012-02-23 DIAGNOSIS — Z720 Tobacco use: Secondary | ICD-10-CM | POA: Diagnosis present

## 2012-02-23 DIAGNOSIS — M549 Dorsalgia, unspecified: Secondary | ICD-10-CM | POA: Diagnosis present

## 2012-02-23 DIAGNOSIS — R079 Chest pain, unspecified: Secondary | ICD-10-CM | POA: Diagnosis present

## 2012-02-23 DIAGNOSIS — T43601A Poisoning by unspecified psychostimulants, accidental (unintentional), initial encounter: Secondary | ICD-10-CM | POA: Diagnosis present

## 2012-02-23 DIAGNOSIS — T405X1A Poisoning by cocaine, accidental (unintentional), initial encounter: Secondary | ICD-10-CM | POA: Diagnosis present

## 2012-02-23 DIAGNOSIS — F1010 Alcohol abuse, uncomplicated: Secondary | ICD-10-CM | POA: Diagnosis present

## 2012-02-23 DIAGNOSIS — F1410 Cocaine abuse, uncomplicated: Secondary | ICD-10-CM | POA: Diagnosis present

## 2012-02-23 DIAGNOSIS — M7710 Lateral epicondylitis, unspecified elbow: Secondary | ICD-10-CM | POA: Diagnosis present

## 2012-02-23 DIAGNOSIS — E876 Hypokalemia: Secondary | ICD-10-CM | POA: Diagnosis present

## 2012-02-23 DIAGNOSIS — F329 Major depressive disorder, single episode, unspecified: Secondary | ICD-10-CM | POA: Diagnosis present

## 2012-02-23 DIAGNOSIS — R4182 Altered mental status, unspecified: Secondary | ICD-10-CM | POA: Diagnosis present

## 2012-02-23 DIAGNOSIS — F419 Anxiety disorder, unspecified: Secondary | ICD-10-CM | POA: Insufficient documentation

## 2012-02-23 DIAGNOSIS — F1210 Cannabis abuse, uncomplicated: Secondary | ICD-10-CM | POA: Diagnosis present

## 2012-02-23 DIAGNOSIS — I10 Essential (primary) hypertension: Secondary | ICD-10-CM | POA: Diagnosis present

## 2012-02-23 DIAGNOSIS — F1910 Other psychoactive substance abuse, uncomplicated: Secondary | ICD-10-CM | POA: Diagnosis present

## 2012-02-23 DIAGNOSIS — F411 Generalized anxiety disorder: Secondary | ICD-10-CM | POA: Diagnosis present

## 2012-02-23 DIAGNOSIS — F3130 Bipolar disorder, current episode depressed, mild or moderate severity, unspecified: Secondary | ICD-10-CM | POA: Diagnosis present

## 2012-02-23 DIAGNOSIS — M109 Gout, unspecified: Secondary | ICD-10-CM | POA: Diagnosis present

## 2012-02-23 DIAGNOSIS — R9431 Abnormal electrocardiogram [ECG] [EKG]: Secondary | ICD-10-CM | POA: Diagnosis present

## 2012-02-23 DIAGNOSIS — I959 Hypotension, unspecified: Secondary | ICD-10-CM | POA: Diagnosis present

## 2012-02-23 DIAGNOSIS — G8929 Other chronic pain: Secondary | ICD-10-CM | POA: Insufficient documentation

## 2012-02-23 DIAGNOSIS — T426X1A Poisoning by other antiepileptic and sedative-hypnotic drugs, accidental (unintentional), initial encounter: Principal | ICD-10-CM | POA: Diagnosis present

## 2012-02-23 DIAGNOSIS — R0789 Other chest pain: Secondary | ICD-10-CM | POA: Diagnosis present

## 2012-02-23 HISTORY — DX: Unspecified osteoarthritis, unspecified site: M19.90

## 2012-02-23 HISTORY — DX: Gout, unspecified: M10.9

## 2012-02-23 HISTORY — DX: Anxiety disorder, unspecified: F41.9

## 2012-02-23 HISTORY — DX: Depression, unspecified: F32.A

## 2012-02-23 HISTORY — DX: Cocaine abuse, uncomplicated: F14.10

## 2012-02-23 HISTORY — DX: Alcohol abuse, uncomplicated: F10.10

## 2012-02-23 HISTORY — DX: Essential (primary) hypertension: I10

## 2012-02-23 HISTORY — DX: Unspecified hemorrhoids: K64.9

## 2012-02-23 HISTORY — DX: Dorsalgia, unspecified: M54.9

## 2012-02-23 HISTORY — DX: Major depressive disorder, single episode, unspecified: F32.9

## 2012-02-23 HISTORY — DX: Vitamin D deficiency, unspecified: E55.9

## 2012-02-23 HISTORY — DX: Bipolar disorder, unspecified: F31.9

## 2012-02-23 HISTORY — DX: Other chronic pain: G89.29

## 2012-02-23 HISTORY — DX: Unspecified viral hepatitis C without hepatic coma: B19.20

## 2012-02-23 HISTORY — DX: Tobacco use: Z72.0

## 2012-02-23 HISTORY — DX: Gastro-esophageal reflux disease without esophagitis: K21.9

## 2012-02-23 LAB — COMPREHENSIVE METABOLIC PANEL
ALT: 99 U/L — ABNORMAL HIGH (ref 0–53)
AST: 46 U/L — ABNORMAL HIGH (ref 0–37)
Albumin: 4.1 g/dL (ref 3.5–5.2)
Alkaline Phosphatase: 63 U/L (ref 39–117)
BUN: 16 mg/dL (ref 6–23)
CO2: 24 mEq/L (ref 19–32)
Calcium: 9.5 mg/dL (ref 8.4–10.5)
Chloride: 101 mEq/L (ref 96–112)
Creatinine, Ser: 0.88 mg/dL (ref 0.50–1.35)
GFR calc Af Amer: >90 mL/min (ref >90)
GFR calc non Af Amer: >90 mL/min (ref >90)
Glucose, Bld: 99 mg/dL (ref 70–99)
Potassium: 3.3 mEq/L — ABNORMAL LOW (ref 3.5–5.1)
Sodium: 137 mEq/L (ref 135–145)
Total Bilirubin: 0.4 mg/dL (ref 0.3–1.2)
Total Protein: 8 g/dL (ref 6.0–8.3)

## 2012-02-23 LAB — CBC WITH DIFFERENTIAL/PLATELET
Basophils Absolute: 0 10*3/uL (ref 0.0–0.1)
Basophils Relative: 0 % (ref 0–1)
Eosinophils Absolute: 0.2 10*3/uL (ref 0.0–0.7)
Eosinophils Relative: 2 % (ref 0–5)
HCT: 44.7 % (ref 39.0–52.0)
Hemoglobin: 15.8 g/dL (ref 13.0–17.0)
Lymphocytes Relative: 33 % (ref 12–46)
Lymphs Abs: 3.4 10*3/uL (ref 0.7–4.0)
MCH: 28.4 pg (ref 26.0–34.0)
MCHC: 35.3 g/dL (ref 30.0–36.0)
MCV: 80.3 fL (ref 78.0–100.0)
Monocytes Absolute: 0.9 10*3/uL (ref 0.1–1.0)
Monocytes Relative: 8 % (ref 3–12)
Neutro Abs: 6 10*3/uL (ref 1.7–7.7)
Neutrophils Relative %: 57 % (ref 43–77)
Platelets: 263 10*3/uL (ref 150–400)
RBC: 5.57 MIL/uL (ref 4.22–5.81)
RDW: 14.5 % (ref 11.5–15.5)
WBC: 10.6 10*3/uL — ABNORMAL HIGH (ref 4.0–10.5)

## 2012-02-23 LAB — LIPID PANEL
Cholesterol: 171 mg/dL (ref 0–200)
HDL: 47 mg/dL (ref >39)
LDL Cholesterol: 106 mg/dL — ABNORMAL HIGH (ref 0–99)
Total CHOL/HDL Ratio: 3.6 RATIO
Triglycerides: 91 mg/dL (ref <150)
VLDL: 18 mg/dL (ref 0–40)

## 2012-02-23 LAB — HEMOGLOBIN A1C
Hgb A1c MFr Bld: 6.3 % — ABNORMAL HIGH (ref <5.7)
Mean Plasma Glucose: 134 mg/dL — ABNORMAL HIGH (ref <117)

## 2012-02-23 LAB — URINE MICROSCOPIC-ADD ON

## 2012-02-23 LAB — URINALYSIS, ROUTINE W REFLEX MICROSCOPIC
Bilirubin Urine: NEGATIVE
Glucose, UA: NEGATIVE mg/dL
Ketones, ur: NEGATIVE mg/dL
Nitrite: NEGATIVE
Protein, ur: NEGATIVE mg/dL
Specific Gravity, Urine: 1.017 (ref 1.005–1.030)
Urobilinogen, UA: 1 mg/dL (ref 0.0–1.0)
pH: 6 (ref 5.0–8.0)

## 2012-02-23 LAB — MAGNESIUM: Magnesium: 1.7 mg/dL (ref 1.5–2.5)

## 2012-02-23 LAB — MRSA PCR SCREENING: MRSA by PCR: NEGATIVE

## 2012-02-23 LAB — RAPID URINE DRUG SCREEN, HOSP PERFORMED
Amphetamines: NOT DETECTED
Barbiturates: NOT DETECTED
Benzodiazepines: NOT DETECTED
Cocaine: POSITIVE — AB
Opiates: NOT DETECTED
Tetrahydrocannabinol: POSITIVE — AB

## 2012-02-23 LAB — GLUCOSE, CAPILLARY
Glucose-Capillary: 92 mg/dL (ref 70–99)
Glucose-Capillary: 164 mg/dL — ABNORMAL HIGH (ref 70–99)

## 2012-02-23 LAB — ACETAMINOPHEN LEVEL: Acetaminophen (Tylenol), Serum: <15 ug/mL (ref 10–30)

## 2012-02-23 LAB — SALICYLATE LEVEL: Salicylate Lvl: <2 mg/dL — ABNORMAL LOW (ref 2.8–20.0)

## 2012-02-23 LAB — TROPONIN I
Troponin I: <0.3 ng/mL (ref <0.30)
Troponin I: <0.3 ng/mL (ref <0.30)

## 2012-02-23 LAB — POCT I-STAT TROPONIN I: Troponin i, poc: 0 ng/mL (ref 0.00–0.08)

## 2012-02-23 LAB — ETHANOL: Alcohol, Ethyl (B): <11 mg/dL (ref 0–11)

## 2012-02-23 MED ORDER — SODIUM CHLORIDE 0.9 % IV SOLN
INTRAVENOUS | Status: DC
  Administered 2012-02-23 (×2): via INTRAVENOUS

## 2012-02-23 MED ORDER — ENOXAPARIN SODIUM 40 MG/0.4ML ~~LOC~~ SOLN: 40.0000 mg | SUBCUTANEOUS | Status: DC

## 2012-02-23 MED ORDER — PANTOPRAZOLE SODIUM 40 MG PO TBEC
40.0000 mg | DELAYED_RELEASE_TABLET | Freq: Every day | ORAL | Status: DC
  Administered 2012-02-23 – 2012-02-25 (×3): 40 mg via ORAL
  Filled 2012-02-23 (×3): qty 1

## 2012-02-23 MED ORDER — AMLODIPINE BESYLATE 10 MG PO TABS: 10.0000 mg | ORAL_TABLET | Freq: Every day | ORAL | Status: DC

## 2012-02-23 MED ORDER — LORAZEPAM 1 MG PO TABS
0.0000 mg | ORAL_TABLET | Freq: Four times a day (QID) | ORAL | Status: DC
  Administered 2012-02-23 – 2012-02-25 (×3): 1 mg via ORAL
  Filled 2012-02-23 (×2): qty 1

## 2012-02-23 MED ORDER — HEPARIN SODIUM (PORCINE) 5000 UNIT/ML IJ SOLN
5000.0000 [IU] | Freq: Three times a day (TID) | INTRAMUSCULAR | Status: DC
  Administered 2012-02-23 – 2012-02-25 (×5): 5000 [IU] via SUBCUTANEOUS
  Filled 2012-02-23 (×8): qty 1

## 2012-02-23 MED ORDER — LORAZEPAM 1 MG PO TABS
1.0000 mg | ORAL_TABLET | Freq: Four times a day (QID) | ORAL | Status: DC | PRN
  Filled 2012-02-23: qty 1

## 2012-02-23 MED ORDER — LORAZEPAM 1 MG PO TABS: 0.0000 mg | ORAL_TABLET | Freq: Two times a day (BID) | ORAL | Status: DC

## 2012-02-23 MED ORDER — THIAMINE HCL 100 MG/ML IJ SOLN
100.0000 mg | Freq: Every day | INTRAMUSCULAR | Status: DC
  Filled 2012-02-23 (×3): qty 1

## 2012-02-23 MED ORDER — ASPIRIN 325 MG PO TABS
325.0000 mg | ORAL_TABLET | Freq: Once | ORAL | Status: AC
  Administered 2012-02-23: 325 mg via ORAL
  Filled 2012-02-23: qty 1

## 2012-02-23 MED ORDER — LORAZEPAM 2 MG/ML IJ SOLN: 1.0000 mg | Freq: Four times a day (QID) | INTRAMUSCULAR | Status: DC | PRN

## 2012-02-23 MED ORDER — SODIUM CHLORIDE 0.9 % IJ SOLN
3.0000 mL | Freq: Two times a day (BID) | INTRAMUSCULAR | Status: DC
  Administered 2012-02-23 – 2012-02-25 (×3): 3 mL via INTRAVENOUS

## 2012-02-23 MED ORDER — ASPIRIN EC 81 MG PO TBEC
81.0000 mg | DELAYED_RELEASE_TABLET | Freq: Every day | ORAL | Status: DC
  Administered 2012-02-24 – 2012-02-25 (×2): 81 mg via ORAL
  Filled 2012-02-23 (×2): qty 1

## 2012-02-23 MED ORDER — AMLODIPINE BESYLATE 10 MG PO TABS
10.0000 mg | ORAL_TABLET | Freq: Every day | ORAL | Status: DC
  Administered 2012-02-23 – 2012-02-25 (×3): 10 mg via ORAL
  Filled 2012-02-23 (×3): qty 1

## 2012-02-23 MED ORDER — SERTRALINE HCL 50 MG PO TABS
50.0000 mg | ORAL_TABLET | Freq: Every day | ORAL | Status: DC
  Administered 2012-02-23: 50 mg via ORAL
  Filled 2012-02-23 (×2): qty 1

## 2012-02-23 MED ORDER — LORATADINE 10 MG PO TABS
10.0000 mg | ORAL_TABLET | Freq: Every day | ORAL | Status: DC
  Administered 2012-02-23 – 2012-02-25 (×3): 10 mg via ORAL
  Filled 2012-02-23 (×3): qty 1

## 2012-02-23 MED ORDER — OMEGA-3-ACID ETHYL ESTERS 1 G PO CAPS
1.0000 g | ORAL_CAPSULE | Freq: Every day | ORAL | Status: DC
  Administered 2012-02-23 – 2012-02-25 (×3): 1 g via ORAL
  Filled 2012-02-23 (×3): qty 1

## 2012-02-23 MED ORDER — SODIUM CHLORIDE 0.9 % IV BOLUS (SEPSIS)
1000.0000 mL | Freq: Once | INTRAVENOUS | Status: AC
  Administered 2012-02-23: 1000 mL via INTRAVENOUS

## 2012-02-23 MED ORDER — VITAMIN B-1 100 MG PO TABS
100.0000 mg | ORAL_TABLET | Freq: Every day | ORAL | Status: DC
  Administered 2012-02-23 – 2012-02-25 (×3): 100 mg via ORAL
  Filled 2012-02-23 (×3): qty 1

## 2012-02-23 MED ORDER — HYDROCORTISONE 2.5 % RE CREA
1.0000 "application " | TOPICAL_CREAM | Freq: Two times a day (BID) | RECTAL | Status: DC
  Administered 2012-02-25: 1 via RECTAL
  Filled 2012-02-23: qty 28.35

## 2012-02-23 MED ORDER — MORPHINE SULFATE 2 MG/ML IJ SOLN
2.0000 mg | INTRAMUSCULAR | Status: DC | PRN
  Administered 2012-02-23: 2 mg via INTRAVENOUS
  Filled 2012-02-23: qty 1

## 2012-02-23 MED ORDER — ATORVASTATIN CALCIUM 80 MG PO TABS
80.0000 mg | ORAL_TABLET | Freq: Every day | ORAL | Status: DC
  Administered 2012-02-24: 80 mg via ORAL
  Filled 2012-02-23 (×2): qty 1

## 2012-02-23 MED ORDER — HYDROCODONE-ACETAMINOPHEN 5-325 MG PO TABS
1.0000 | ORAL_TABLET | Freq: Once | ORAL | Status: AC
  Administered 2012-02-23: 1 via ORAL
  Filled 2012-02-23: qty 1

## 2012-02-23 MED ORDER — OMEPRAZOLE MAGNESIUM 20 MG PO TBEC: 40.0000 mg | DELAYED_RELEASE_TABLET | Freq: Every day | ORAL | Status: DC

## 2012-02-23 MED ORDER — LISINOPRIL 40 MG PO TABS
40.0000 mg | ORAL_TABLET | Freq: Every day | ORAL | Status: DC
  Administered 2012-02-23 – 2012-02-25 (×3): 40 mg via ORAL
  Filled 2012-02-23 (×3): qty 1

## 2012-02-23 MED ORDER — ADULT MULTIVITAMIN W/MINERALS CH
1.0000 | ORAL_TABLET | Freq: Every day | ORAL | Status: DC
  Administered 2012-02-23 – 2012-02-25 (×3): 1 via ORAL
  Filled 2012-02-23 (×3): qty 1

## 2012-02-23 MED ORDER — NITROGLYCERIN 0.4 MG SL SUBL: 0.4000 mg | SUBLINGUAL_TABLET | SUBLINGUAL | Status: DC | PRN

## 2012-02-23 MED ORDER — HYDROCODONE-ACETAMINOPHEN 10-325 MG PO TABS: 1.0000 | ORAL_TABLET | Freq: Once | ORAL | Status: DC

## 2012-02-23 MED ORDER — POTASSIUM CHLORIDE CRYS ER 20 MEQ PO TBCR
40.0000 meq | EXTENDED_RELEASE_TABLET | ORAL | Status: AC
  Administered 2012-02-23: 40 meq via ORAL
  Filled 2012-02-23: qty 2

## 2012-02-23 MED ORDER — INSULIN ASPART 100 UNIT/ML ~~LOC~~ SOLN: 0.0000 [IU] | Freq: Three times a day (TID) | SUBCUTANEOUS | Status: DC

## 2012-02-23 MED ORDER — LISINOPRIL 40 MG PO TABS: 40.0000 mg | ORAL_TABLET | Freq: Every day | ORAL | Status: DC

## 2012-02-23 NOTE — H&P (Signed)
Date: 02/23/2012               Patient Name:  [name] MRN: [id number]  DOB: Aug 15, 1960 Age / Sex: 51 y.o., male   PCP: [facility]              Medical Service: Internal Medicine Teaching Service              Attending Physician: Dr. Criselda Peaches    First Contact: Dr. Collier Bullock Pager: [phone]  Second Contact: Dr. Clyde Lundborg Pager: [phone]            After Hours (After 5p/  First Contact Pager: [phone]  weekends / holidays): Second Contact Pager: [phone]     Chief Complaint: chest pain, right arm tingling  History of Present Illness: Patient is a 51 y.o. male with a PMHx of bipolar disorder (with history of multiple BH admissions for overdose and SI), polysubstance abuse (cocaine, alcohol, tobacco), and hepatitis C who presents to [facility] for evaluation of chest pain and right arm pain/ tingling that started approximately 2-3 days prior to admission but peaked early on the morning of admission. The patient states that his chest pain is present both at rest and with exertion, however he is unable to characterize it further or tell me any aggravating or alleviating factors. He states that he may have been sick recently with cough and congestion. The patient otherwise dates that he last smoked crack cocaine on the day prior to admission, and that he is very tired today because he has not slept for several days and took Ambien on the morning of admission. He otherwise denies intentional drug overdose, has not overused his home antipsychotics or pain medications, and states he has not used other illicit drugs (aside from [facility] and cocaine). In regards to his bipolar disorder, he states he is followed at the [facility], he confirms auditory hallucinations (they do not tell him to harm himself or others), but has not recently had visual hallucinations. He cannot tell me if he feels like he is having a manic episode.  Review of Systems: Limited review of systems is able to be obtained  secondary to patient not cooperating and having increased somnolence.  Constitutional:  denies fever, chills, diaphoresis, appetite change and fatigue.  Respiratory: admits to SOB, DOE, cough. Denies wheezing.  Cardiovascular: admits to chest pain. Denies palpitations and leg swelling.  Gastrointestinal: denies nausea, vomiting, abdominal pain, diarrhea, constipation, blood in stool.  Genitourinary: denies dysuria, urgency, frequency, hematuria, flank pain and difficulty urinating.  Musculoskeletal: admits to back pain and right arm pain.  Denies joint swelling, arthralgias and gait problem.   Skin: admits to abrasion over left eye.   Psychiatric/ Behavioral: admits to auditory hallucinations. Denies suicidal ideation, mood changes, confusion, nervousness, sleep disturbance and agitation.    Current Outpatient Medications: Current Facility-Administered Medications Medication Dose  . potassium chloride SA (K-DUR,KLOR-CON) CR tablet 40 mEq  40 mEq  . [COMPLETED] sodium chloride 0.9 % bolus 1,000 mL  1,000 mL  . [COMPLETED] sodium chloride 0.9 % bolus 1,000 mL  1,000 mL  . [DISCONTINUED] 0.9 %  sodium chloride infusion     Current Outpatient Prescriptions Medication Sig  . acetaminophen (TYLENOL) 325 MG tablet Take 325 mg by mouth every 6 (six) hours as needed.  Marland Kitchen amLODipine (NORVASC) 10 MG tablet Take 10 mg by mouth daily.  . capsaicin (ZOSTRIX) 0.025 % cream Apply topically 2 (two) times daily.  Marland Kitchen EPINEPHrine (EPIPEN  JR) 0.15 MG/0.3ML injection Inject 0.15 mg into the muscle as needed.  . ergocalciferol (VITAMIN D2) 50000 UNITS capsule Take 50,000 Units by mouth once a week.  Marland Kitchen HYDROcodone-acetaminophen (NORCO) 10-325 MG per tablet Take 1 tablet by mouth every 8 (eight) hours as needed.  . hydrocortisone (ANUSOL-HC) 2.5 % rectal cream Place 1 application rectally 2 (two) times daily.  . indomethacin (INDOCIN) 50 MG capsule Take 50 mg by mouth 4 (four) times daily.  Marland Kitchen lisinopril  (PRINIVIL,ZESTRIL) 40 MG tablet Take 40 mg by mouth daily.  Marland Kitchen loratadine (CLARITIN) 10 MG tablet Take 10 mg by mouth daily.  . methocarbamol (ROBAXIN) 750 MG tablet Take 750 mg by mouth 3 (three) times daily as needed.  . Omega-3 Fatty Acids (FISH OIL) 1000 MG CAPS Take 2,000 mg by mouth daily.  Marland Kitchen omeprazole (PRILOSEC OTC) 20 MG tablet Take 40 mg by mouth daily.  . QUEtiapine (SEROQUEL) 400 MG tablet Take 400 mg by mouth at bedtime.  . salsalate (DISALCID) 750 MG tablet Take 750 mg by mouth 2 (two) times daily.  . sertraline (ZOLOFT) 100 MG tablet Take 50 mg by mouth at bedtime.  Marland Kitchen zolpidem (AMBIEN) 10 MG tablet Take 5 mg by mouth at bedtime as needed.     Allergies: No Known Allergies    Past Medical History: Diagnosis  . Arthritis  . Hypertension  . Anxiety  . Bipolar affective disorder  . Depression  . Alcohol abuse  . Cocaine abuse    - smokes crack-cocaine   . GERD (gastroesophageal reflux disease)  . Chronic back pain  . Tobacco abuse  . Hepatitis C  . Vitamin D deficiency  . Gout  . Hemorrhoids    Past Surgical History: History reviewed. No pertinent past surgical history.   Family History: Family History  Problem Relation Age of Onset  . Hypertension Brother   . Diabetes Brother   . Hypertension Mother   . Diabetes Mother   . Arthritis Mother   . Other Sister     bone disease    Social History: History   Social History  . Marital Status: Single    Spouse Name: N/A    Number of Children: N/A  . Years of Education: N/A   Occupational History  . Not on file.   Social History Main Topics  . Smoking status: Not on file  . Smokeless tobacco: Not on file  . Alcohol Use:   . Drug Use:   . Sexually Active:    Other Topics Concern  . Not on file   Social History Narrative   Lives alone in [place], [place]     Vital Signs: Blood pressure 136/95, pulse 90, resp. rate 29, SpO2 97.00%.  Physical Exam: General: Vital signs reviewed and noted.  Well-developed, well-nourished, in no acute distress; somnolent and falling asleep throughout exam, agitated with prolonged questioning.  Head: Normocephalic, small 2cm abrasion on forehead above left eye.  Eyes: Uncooperative to exam.  Nose: Uncooperative to exam.  Throat: Uncooperative to exam.  Neck: No deformities, masses, or tenderness noted. Supple, no JVD.  Lungs:  Normal respiratory effort. Clear to auscultation BL without crackles or wheezes.  Heart: RRR. S1 and S2 normal without gallop, rubs. (+) systolic murmur.  Abdomen:  BS normoactive. Soft, Nondistended, mild diffuse TTP.  No masses or organomegaly.  Extremities: No pretibial edema.  Neurologic: A&O X3, CN II - XII are grossly intact. Motor strength is 5/5 in the all 4 extremities, Sensations intact to  light touch.    Lab results: CBC:    Component Value Date/Time   WBC 10.6* 02/23/2012 0646   HGB 15.8 02/23/2012 0646   HCT 44.7 02/23/2012 0646   PLT 263 02/23/2012 0646   MCV 80.3 02/23/2012 0646   NEUTROABS 6.0 02/23/2012 0646   LYMPHSABS 3.4 02/23/2012 0646   MONOABS 0.9 02/23/2012 0646   EOSABS 0.2 02/23/2012 0646   BASOSABS 0.0 02/23/2012 0646     Metabolic Panel:    Component Value Date/Time   NA 137 02/23/2012 0646   K 3.3* 02/23/2012 0646   CL 101 02/23/2012 0646   CO2 24 02/23/2012 0646   BUN 16 02/23/2012 0646   CREATININE 0.88 02/23/2012 0646   GLUCOSE 99 02/23/2012 0646   CALCIUM 9.5 02/23/2012 0646   AST 46* 02/23/2012 0646   ALT 99* 02/23/2012 0646   ALKPHOS 63 02/23/2012 0646   BILITOT 0.4 02/23/2012 0646   PROT 8.0 02/23/2012 0646   ALBUMIN 4.1 02/23/2012 0646     Urinalysis:  Basename 02/23/12 1022  COLORURINE ORANGE*  LABSPEC 1.017  PHURINE 6.0  GLUCOSEU NEGATIVE  HGBUR LARGE*  BILIRUBINUR NEGATIVE  KETONESUR NEGATIVE  PROTEINUR NEGATIVE  UROBILINOGEN 1.0  NITRITE NEGATIVE  LEUKOCYTESUR SMALL*     Drugs of Abuse     Component Value Date/Time   LABOPIA NONE DETECTED  02/23/2012 1022   COCAINSCRNUR POSITIVE* 02/23/2012 1022   LABBENZ NONE DETECTED 02/23/2012 1022   AMPHETMU NONE DETECTED 02/23/2012 1022   THCU POSITIVE* 02/23/2012 1022   LABBARB NONE DETECTED 02/23/2012 1022      Historical Labs: No results found for this basename: HGBA1C    No results found for this basename: CHOL,  HDL,  LDLCALC,  LDLDIRECT,  TRIG,  CHOLHDL    No results found for this basename: TSH,  T3TOTAL,  T4TOTAL,  THYROIDAB     Imaging results:   Ct Head Wo Contrast (02/23/2012) - Negative head CT   Original Report Authenticated By: Paulina Fusi, M.D.    Dg Chest Portable 1 View (02/23/2012) - Poor inspiration.  No active disease suspected.   Original Report Authenticated By: Paulina Fusi, M.D.     Other results:  EKG (02/23/2012) - Sinus Tachycardia of rate 100 bpm,  left axis deviation, ST segments: nonspecific ST changes and nonspecific T wave changes.     Assessment & Plan:  Pt is a 51 y.o. yo male with a PMHx of bipolar disorder (multiple prior attempted overdoses, BH admission for SI), polysubstance abuse (cocaine, alcohol, tobacco), HTN, DM2 (not currently on therapy) who was admitted on 02/23/2012 with symptoms of chest pain and right arm pain, and also noted to be significantly somnolent during ED course.  1) Chest pain - etiology unclear, however, patient has multiple risk factors for ischemic heart disease (including age, sex, tobacco abuse, cocaine abuse), as well, he has new LAD on EKG, which is of concern. Therefore, acute cardiac cause of pain must be evaluated, despite the atypical nature of his pain. Given crack-cocaine abuse, aortic dissection versus PTX or pneumomediastinum are also considered, however, felt to be less likely given lack of CXR findings. No indication on Exam of MSK pain.  Plan: - Admit to SDU on telemetry. - Cycle cardiac enzymes, check FLP, TSH, and A1c. - Administer aspirin, statin, no BB in setting of cocaine abuse, PRN NTG,  morphine PRN. - Consider repeat 2-view CXR when patient is able to be more cooperative to exam. - Check BP in both arms.  2) Altered mental status, possible toxic encephalopathy - cause unclear, somnolence may simply reflect the double dose of Ambien that the patient took this morning, particularly if combined with his other multiple sedative medications. However, there is concern for possible overdose of his seroquel given initial hypotension, mild tachycardia, somnolence, confusion (which can be seen). Unfortunately, there are no serology tests to evaluate further and treatment is mostly supportive. Infectious cause of AMS is also considered in setting of leukocytosis, although thought to be less likely given lack of systemic symptoms and CXR/ UA not indicative of acute infection. Of note, ETOH < 11.  - Check acetaminophen and salicylate levels.  - Check blood cultures x 2. - Hold seroquel and other sedative medications for now. - CIWA protocol in case of contribution of alcohol withdrawal. - Sitter at bedside - preferably male sitter. - Fall precautions.  3) Diabetes mellitus, type 2 - not currently on home medications. - Will check A1c  4) Hypertension - avoid BB in setting of cocaine abuse. BP mildly elevated on admission. - Continue home lisinopril, amlodipine  5) HLD - not currently on statin, however, is on fish oil. - Check lipid panel. - Start statin, continue fish oil particularly given concern for cardiac chest pain.  6) Bipolar disorder - managed at [facility] on seroquel and sertraline. However, given concern for possible overdose of seroquel, will need to hold at present. States he is having ongoing auditory hallucinations and staying awake for multiple nights, although denies SI/HI at this time. - Will continue home sertraline. - Hold seroquel. - Consult psychiatry to help in management of his antipsychotics.  7) Polysubstance abuse - ongoing cocaine, tobacco, alcohol. -  CIWA protocol. - Consider SW consult when mental status improves.  8) Gout - no indication so acute flare. On chronic indomethacin. Goal uric acid level of < 6. - Will hold indomethacin initially, which evaluating possible toxic encephalopathy - however, will likely need to resume. - Check uric acid level.  9) Vitamin D deficiency - on chronic high dose vitamin D. -  Check 25-OH vitamin D levels given risk of overdose if not appropriately monitored.  10) Hypokalemia - possibly due to decreased oral intake, otherwise not on medications to precipitate. Can be seen sometimes in seroquel overdose (although less so suspected given lack of hypotension and tachycardia). - Replete x 1. - Check Mg. - Follow BMETs. - Replete x 1.  11) GERD - PPI.    DVT PPX - low molecular weight heparin  CODE STATUS - FULL  CONSULTS PLACED - Psychiatry  DISPO - Disposition is deferred at this time, awaiting improvement of mental status and evaluation for cardiac cause of chest pain.   Anticipated discharge in approximately 2-3 day(s).   The patient does have current PCP (Dr. Andee Lineman at the [facility]), therefore will not be requiring OPC follow-up after discharge (unless 1 time appointment is needed).   Lastly, the patient's need for transportation to clinic appointments will need to be assessed.   SERVICE NEEDED AT DISCHARGE - TO BE DETERMINED DURING HOSPITAL COURSE         Y = Yes, Blank = No PT:   OT:   RN:   Equipment:   Other:      Signed: Priscella Mann, DO  PGY-3, Internal Medicine Resident 02/23/2012, 2:15 PM

## 2012-02-23 NOTE — ED Notes (Signed)
Admitting MD at bedside.

## 2012-02-23 NOTE — ED Notes (Signed)
MD at bedside. 

## 2012-02-23 NOTE — ED Notes (Signed)
In & out cath attempted x 3 without success. First with 16 Fr, second with 14 Fr & third with a 12 Fr. Pt now noted to be bleeding from urethra. Dr. Preston Fleeting informed & aware.

## 2012-02-23 NOTE — ED Notes (Signed)
Plan of care discussed with Pt. Pt. Was able to recall some events of last night. He states he had numbness in is right forearm and hand that was followed by Chest pain. Pt. Does not recall calling 911.

## 2012-02-23 NOTE — ED Notes (Signed)
Pt was attempting to exit bed, stating he had to urinate. Pt. Began to defecate while exiting bed, was led to a bed pan on a chair. Pt. Was cleaned, linens changed. Pt. Returned to bed and resting.

## 2012-02-23 NOTE — Progress Notes (Signed)
INTERIM PROGRESS NOTE  Returned to the ED to re-evaluate the patient, at which time the patient's mental status has cleared significantly. He is now able to speak in full sentences, express his feeling, without falling asleep or expressing confusion in between our discussions. He is able to express that his main concern right now is his right arm pain, and that he wants to get into a hospital room as soon as possible. He has repeatedly attempted to leave the ER, and earlier there was a concern for the patient's safety given his degree of somnolence and confusion. At this point (as mentioned above), there is less concern for encephalopathy.   The patient was explained that we medically recommend admission because of his concerning EKG findings and chest pain, therefore, we want to rule out acute cardiac cause of his chest pain. The patient was agreeable to stay as long as we could provide pain medication to help with his arm pain.  However, given the repeated nature of his requests to leave, and now that his mental status is cleared, the patient was explained that if he ultimately decides to leave, he will have to leave against medical advice. The meaning and ramifications of AMA departure was explained to the patient by Dr. Criselda Peaches.  Therefore, overnight, if again the patient insists on discharge, he will be required to sign AMA paperwork.   Signed: Johnette Abraham, Roma Schanz, Internal Medicine Resident 02/23/2012, 4:36 PM

## 2012-02-23 NOTE — ED Notes (Signed)
PER EMS- Pt initially called 911 for CP. Upon Fire department arrival pt collapsed at door. Pt now has multiple complaints. Not following commands. Pt does respond to voice and painful stimuli. Pt has abrasion on forehead. Pt denies drug use, but Marijuana found on scene. Multiple unidentified pills found in med container. 20 L hand. CBG 94. BP 180/110 HR 106. Negative stroke scale.

## 2012-02-23 NOTE — ED Notes (Signed)
Admitting MD at bedside, aware of Pt refusing to stay.

## 2012-02-23 NOTE — ED Notes (Signed)
Pt was found changed out of his gown, sitting in a chair, stating he would like to leave. Pt was explained the risk of leaving. Pt. Briefly walked out of room and returned. Encouraged pt to stay.

## 2012-02-23 NOTE — H&P (Signed)
Internal Medicine Teaching Service Attending Note Date: 02/23/2012  Patient name: [name]  Medical record number: [id number]  Date of birth: 1961-01-09   I have seen and evaluated [name] and discussed their care with the Residency Team.    Mr. Torr is a 51yo man who presented to the hospital complaining of substernal chest pain.  When I saw him, Mr. Vertz was attempting to leave the hospital.  He was brought to the ED by EMS because the patient called them c/o chest pain, however, Mr. Derusha does not remember this happening.  Through his time in the ED, he was intermittently somnolent and agitated, walking the halls and looking for his shoes.  His mental status slowly cleared and he became more conversive, however, he was not interested in answering most questions; during my last interview with him he was alert and oriented to person, place, time. Marland Kitchen  He did report taking Ambien this morning because he had been awake for multiple days.  He has a history of bipolar disorder and it is unclear if he is taking his medications.  This may represent a manic episode.  Per discussion with Resident Dr. Saralyn Pilar, further history is noted to have chest pain and right arm pain that started 2 days prior to admission.  The pain is reported to be at rest and with exertion.  He does use crack cocaine and used this drug the day prior to admission.  He follows at the [facility] for his bipolar disorder.  He would not answer any further questions.   Further history as per resident note.   Physical Exam: Blood pressure 144/113, pulse 106, temperature 98.4 F (36.9 C), temperature source Oral, resp. rate 18, SpO2 98.00%. Mr. Meddaugh would not allow me to perform a physical exam, this is deferred at this time, please see Resident note for full physical exam.   Lab results: Results for orders placed during the hospital encounter of 02/23/12 (from the past 24 hour(s))  CBC WITH DIFFERENTIAL     Status:  Abnormal   Collection Time   02/23/12  6:46 AM      Component Value Range   WBC 10.6 (*) 4.0 - 10.5 K/uL   RBC 5.57  4.22 - 5.81 MIL/uL   Hemoglobin 15.8  13.0 - 17.0 g/dL   HCT 78.2  95.6 - 21.3 %   MCV 80.3  78.0 - 100.0 fL   MCH 28.4  26.0 - 34.0 pg   MCHC 35.3  30.0 - 36.0 g/dL   RDW 08.6  57.8 - 46.9 %   Platelets 263  150 - 400 K/uL   Neutrophils Relative 57  43 - 77 %   Neutro Abs 6.0  1.7 - 7.7 K/uL   Lymphocytes Relative 33  12 - 46 %   Lymphs Abs 3.4  0.7 - 4.0 K/uL   Monocytes Relative 8  3 - 12 %   Monocytes Absolute 0.9  0.1 - 1.0 K/uL   Eosinophils Relative 2  0 - 5 %   Eosinophils Absolute 0.2  0.0 - 0.7 K/uL   Basophils Relative 0  0 - 1 %   Basophils Absolute 0.0  0.0 - 0.1 K/uL  COMPREHENSIVE METABOLIC PANEL     Status: Abnormal   Collection Time   02/23/12  6:46 AM      Component Value Range   Sodium 137  135 - 145 mEq/L   Potassium 3.3 (*) 3.5 - 5.1 mEq/L  Chloride 101  96 - 112 mEq/L   CO2 24  19 - 32 mEq/L   Glucose, Bld 99  70 - 99 mg/dL   BUN 16  6 - 23 mg/dL   Creatinine, Ser 4.09  0.50 - 1.35 mg/dL   Calcium 9.5  8.4 - 81.1 mg/dL   Total Protein 8.0  6.0 - 8.3 g/dL   Albumin 4.1  3.5 - 5.2 g/dL   AST 46 (*) 0 - 37 U/L   ALT 99 (*) 0 - 53 U/L   Alkaline Phosphatase 63  39 - 117 U/L   Total Bilirubin 0.4  0.3 - 1.2 mg/dL   GFR calc non Af Amer >90  >90 mL/min   GFR calc Af Amer >90  >90 mL/min  ETHANOL     Status: Normal   Collection Time   02/23/12  6:46 AM      Component Value Range   Alcohol, Ethyl (B) <11  0 - 11 mg/dL  POCT I-STAT TROPONIN I     Status: Normal   Collection Time   02/23/12  6:55 AM      Component Value Range   Troponin i, poc 0.00  0.00 - 0.08 ng/mL   Comment 3           TROPONIN I     Status: Normal   Collection Time   02/23/12  7:16 AM      Component Value Range   Troponin I <0.30  <0.30 ng/mL  URINE RAPID DRUG SCREEN (HOSP PERFORMED)     Status: Abnormal   Collection Time   02/23/12 10:22 AM      Component  Value Range   Opiates NONE DETECTED  NONE DETECTED   Cocaine POSITIVE (*) NONE DETECTED   Benzodiazepines NONE DETECTED  NONE DETECTED   Amphetamines NONE DETECTED  NONE DETECTED   Tetrahydrocannabinol POSITIVE (*) NONE DETECTED   Barbiturates NONE DETECTED  NONE DETECTED  URINALYSIS, ROUTINE W REFLEX MICROSCOPIC     Status: Abnormal   Collection Time   02/23/12 10:22 AM      Component Value Range   Color, Urine ORANGE (*) YELLOW   APPearance CLOUDY (*) CLEAR   Specific Gravity, Urine 1.017  1.005 - 1.030   pH 6.0  5.0 - 8.0   Glucose, UA NEGATIVE  NEGATIVE mg/dL   Hgb urine dipstick LARGE (*) NEGATIVE   Bilirubin Urine NEGATIVE  NEGATIVE   Ketones, ur NEGATIVE  NEGATIVE mg/dL   Protein, ur NEGATIVE  NEGATIVE mg/dL   Urobilinogen, UA 1.0  0.0 - 1.0 mg/dL   Nitrite NEGATIVE  NEGATIVE   Leukocytes, UA SMALL (*) NEGATIVE  URINE MICROSCOPIC-ADD ON     Status: Normal   Collection Time   02/23/12 10:22 AM      Component Value Range   Squamous Epithelial / LPF RARE  RARE   WBC, UA 0-2  <3 WBC/hpf   RBC / HPF TOO NUMEROUS TO COUNT  <3 RBC/hpf   Bacteria, UA RARE  RARE  LIPID PANEL     Status: Abnormal   Collection Time   02/23/12 12:45 PM      Component Value Range   Cholesterol 171  0 - 200 mg/dL   Triglycerides 91  <914 mg/dL   HDL 47  >78 mg/dL   Total CHOL/HDL Ratio 3.6     VLDL 18  0 - 40 mg/dL   LDL Cholesterol 295 (*) 0 - 99 mg/dL  MAGNESIUM  Status: Normal   Collection Time   02/23/12 12:45 PM      Component Value Range   Magnesium 1.7  1.5 - 2.5 mg/dL  ACETAMINOPHEN LEVEL     Status: Normal   Collection Time   02/23/12 12:45 PM      Component Value Range   Acetaminophen (Tylenol), Serum <15.0  10 - 30 ug/mL  SALICYLATE LEVEL     Status: Abnormal   Collection Time   02/23/12 12:45 PM      Component Value Range   Salicylate Lvl <2.0 (*) 2.8 - 20.0 mg/dL  GLUCOSE, CAPILLARY     Status: Normal   Collection Time   02/23/12  5:46 PM      Component Value Range     Glucose-Capillary 92  70 - 99 mg/dL  TROPONIN I     Status: Normal   Collection Time   02/23/12  6:32 PM      Component Value Range   Troponin I <0.30  <0.30 ng/mL    Imaging results:  Ct Head Wo Contrast  02/23/2012  *RADIOLOGY REPORT*  Clinical Data: Syncopal episode.  Frontal trauma.  CT HEAD WITHOUT CONTRAST  Technique:  Contiguous axial images were obtained from the base of the skull through the vertex without contrast.  Comparison: 09/10/2004  Findings: There is no evidence of old or acute infarction, mass lesion, hemorrhage, hydrocephalus or extra-axial collection.  No skull fracture.  Sinuses, middle ears and mastoids are clear.  IMPRESSION: Negative head CT   Original Report Authenticated By: Paulina Fusi, M.D.    Dg Chest Portable 1 View  02/23/2012  *RADIOLOGY REPORT*  Clinical Data: Chest pain and confusion.  PORTABLE CHEST - 1 VIEW  Comparison: 03/13/2010  Findings: Artifact overlies chest.  Heart size is normal. Mediastinal shadows are normal.  The patient has not taken a deep inspiration.  No evidence of infiltrate, collapse or effusion.  No edema.  No bony finding.  IMPRESSION: Poor inspiration.  No active disease suspected.   Original Report Authenticated By: Paulina Fusi, M.D.     Assessment and Plan: I agree with the formulated Assessment and Plan with the following changes:   1. Chest pain - Unclear etiology, however, patient in the right age to have coronary disease, another differential would be vasospasm from cocaine use.  - EKG with new LAD - Cycle CE, check fasting lipids, TSH, A1C - Aspirin, statin, NTG and morphine prn - No BB due to cocaine use - BP in both arms  2. AMS - When I saw Mr. Capehart, his mentation had cleared, however, he did still appear to be somewhat agitated with being in the emergency department.  I am inclined to believe that his earlier somnolence was due to taking ambien and that the drug is now leaving his system.  However, he does have  risk factors for seroquel overdose, ETOH withdrawal and possible metabolic encephalopathy (though labs relatively normal, mildly elevated LFTs, low K) - CIWA protocol - Symptomatic care, sitter if needed - Hold seroquel - Tylenol and aspirin levels low - Psychiatry consult  Other issues as per resident note, if patient were to leave tonight, it would be AMA as he will need to be ruled out for cardiac pathology.     Inez Catalina, MD 11/15/20139:20 PM

## 2012-02-23 NOTE — ED Notes (Signed)
Patient transported to CT with Sitter.

## 2012-02-23 NOTE — ED Notes (Signed)
Condom cath applied

## 2012-02-23 NOTE — ED Notes (Signed)
Pt. Removed IV. Pt. Able to state where he spent the night,but does not recall what he was doing or that he called EMS.  Still attempting to get out of bed, flails his arms and will quickly fall back asleep. Sitter at bedside.

## 2012-02-23 NOTE — ED Provider Notes (Signed)
History     CSN: [id number]  Arrival date & time 02/23/12  5284   First MD Initiated Contact with Patient 02/23/12 [phone]      Chief Complaint  Patient presents with  . Chest Pain    (Consider location/radiation/quality/duration/timing/severity/associated sxs/prior treatment) Patient is a 51 y.o. male presenting with chest pain. The history is provided by the patient and the EMS personnel. The history is limited by the condition of the patient (altered mental status).  Chest Pain   He was brought in by EMS. He had reportedly called for an impotence because of chest pain and collapsed at that he had before when EMS arrived. He is not able to give me any coherent history other than he is complaining about something in his chest. He denies alcohol use and drug use.  No past medical history on file.  No past surgical history on file.  No family history on file.  History  Substance Use Topics  . Smoking status: Not on file  . Smokeless tobacco: Not on file  . Alcohol Use: Not on file      Review of Systems  Unable to perform ROS: Mental status change  Cardiovascular: Positive for chest pain.    Allergies  Review of patient's allergies indicates not on file.  Home Medications  No current outpatient prescriptions on file.  BP 101/88  Resp 25  SpO2 93%  Physical Exam  Nursing note and vitals reviewed. 51 year old male, who is somnolent but arousable and when aroused is able to answer a few questions. He is in no acute distress. Vital signs are significant for tachypnea with respiratory rate of 25. Oxygen saturation is 93%, which is normal. Head is normocephalic. Abrasions noted on the forehead. PERRLA, EOMI. Oropharynx is clear. Neck is nontender and supple without adenopathy or JVD. Back is nontender and there is no CVA tenderness. Lungs are clear without rales, wheezes, or rhonchi. Chest is nontender. Heart has regular rate and rhythm without murmur. Abdomen is  soft, flat, nontender without masses or hepatosplenomegaly and peristalsis is normoactive. Extremities have no cyanosis or edema, full range of motion is present. Skin is warm and dry without rash. Neurologic: He is somnolent but arousable and when aroused oriented to person and knows he is in a hospital but not oriented to time, cranial nerves are intact, there are no motor or sensory deficits.   ED Course  Procedures (including critical care time)  Results for orders placed during the hospital encounter of 02/23/12  CBC WITH DIFFERENTIAL      Component Value Range   WBC 10.6 (*) 4.0 - 10.5 K/uL   RBC 5.57  4.22 - 5.81 MIL/uL   Hemoglobin 15.8  13.0 - 17.0 g/dL   HCT 40.1  02.7 - 25.3 %   MCV 80.3  78.0 - 100.0 fL   MCH 28.4  26.0 - 34.0 pg   MCHC 35.3  30.0 - 36.0 g/dL   RDW 66.4  40.3 - 47.4 %   Platelets 263  150 - 400 K/uL   Neutrophils Relative 57  43 - 77 %   Neutro Abs 6.0  1.7 - 7.7 K/uL   Lymphocytes Relative 33  12 - 46 %   Lymphs Abs 3.4  0.7 - 4.0 K/uL   Monocytes Relative 8  3 - 12 %   Monocytes Absolute 0.9  0.1 - 1.0 K/uL   Eosinophils Relative 2  0 - 5 %   Eosinophils Absolute 0.2  0.0 - 0.7 K/uL   Basophils Relative 0  0 - 1 %   Basophils Absolute 0.0  0.0 - 0.1 K/uL  COMPREHENSIVE METABOLIC PANEL      Component Value Range   Sodium 137  135 - 145 mEq/L   Potassium 3.3 (*) 3.5 - 5.1 mEq/L   Chloride 101  96 - 112 mEq/L   CO2 24  19 - 32 mEq/L   Glucose, Bld 99  70 - 99 mg/dL   BUN 16  6 - 23 mg/dL   Creatinine, Ser 1.61  0.50 - 1.35 mg/dL   Calcium 9.5  8.4 - 09.6 mg/dL   Total Protein 8.0  6.0 - 8.3 g/dL   Albumin 4.1  3.5 - 5.2 g/dL   AST 46 (*) 0 - 37 U/L   ALT 99 (*) 0 - 53 U/L   Alkaline Phosphatase 63  39 - 117 U/L   Total Bilirubin 0.4  0.3 - 1.2 mg/dL   GFR calc non Af Amer >90  >90 mL/min   GFR calc Af Amer >90  >90 mL/min  ETHANOL      Component Value Range   Alcohol, Ethyl (B) <11  0 - 11 mg/dL  POCT I-STAT TROPONIN I      Component  Value Range   Troponin i, poc 0.00  0.00 - 0.08 ng/mL   Comment 3           TROPONIN I      Component Value Range   Troponin I <0.30  <0.30 ng/mL   Ct Head Wo Contrast  02/23/2012  *RADIOLOGY REPORT*  Clinical Data: Syncopal episode.  Frontal trauma.  CT HEAD WITHOUT CONTRAST  Technique:  Contiguous axial images were obtained from the base of the skull through the vertex without contrast.  Comparison: 09/10/2004  Findings: There is no evidence of old or acute infarction, mass lesion, hemorrhage, hydrocephalus or extra-axial collection.  No skull fracture.  Sinuses, middle ears and mastoids are clear.  IMPRESSION: Negative head CT   Original Report Authenticated By: Paulina Fusi, M.D.    Dg Chest Portable 1 View  02/23/2012  *RADIOLOGY REPORT*  Clinical Data: Chest pain and confusion.  PORTABLE CHEST - 1 VIEW  Comparison: 03/13/2010  Findings: Artifact overlies chest.  Heart size is normal. Mediastinal shadows are normal.  The patient has not taken a deep inspiration.  No evidence of infiltrate, collapse or effusion.  No edema.  No bony finding.  IMPRESSION: Poor inspiration.  No active disease suspected.   Original Report Authenticated By: Paulina Fusi, M.D.       Date: 02/23/2012  Rate: 103  Rhythm: sinus tachycardia  QRS Axis: left  Intervals: normal  ST/T Wave abnormalities: nonspecific ST/T changes  Conduction Disutrbances:none  Narrative Interpretation: Sinus tachycardia, left axis deviation, left atrial hypertrophy, nonspecific ST and T changes. When compared with ECG 08/16/2010, left axis deviation and nonspecific ST and T changes are now present.  Old EKG Reviewed: changes noted    1. Altered mental status       MDM  Altered mental status of uncertain cause. He clearly had to be some minor head trauma, so CT of the head will be obtained as well as ethanol level and drug screen. Old records are reviewed and he has a history of psychiatric admissions and drug abuse and  hallucinations. Drug screens have been consistently positive for marijuana and cocaine in the past. He had a cardiac but I'm unable to get the results  of that procedure.  Patient has been observed in the emergency department and is not showing any signs of improving his mental status. Workup has been unremarkable. Mild elevation in transaminases consistent with known history of hepatitis C. At this point, we have been unable to obtain a urine sample and attempts at straight catheterization were also unsuccessful. He most likely has had an accidental overdose of drugs. He'll need to be admitted for observation. Case is discussed with the resident on call for internal medicine teaching service who agrees to admit the patient.      Dione Booze, MD 02/23/12 1046

## 2012-02-24 ENCOUNTER — Inpatient Hospital Stay (HOSPITAL_COMMUNITY): Payer: PRIVATE HEALTH INSURANCE

## 2012-02-24 DIAGNOSIS — F1410 Cocaine abuse, uncomplicated: Secondary | ICD-10-CM

## 2012-02-24 DIAGNOSIS — F319 Bipolar disorder, unspecified: Secondary | ICD-10-CM

## 2012-02-24 LAB — TROPONIN I
Troponin I: <0.3 ng/mL (ref <0.30)
Troponin I: <0.3 ng/mL (ref <0.30)

## 2012-02-24 LAB — URINALYSIS, ROUTINE W REFLEX MICROSCOPIC
Bilirubin Urine: NEGATIVE
Glucose, UA: NEGATIVE mg/dL
Ketones, ur: NEGATIVE mg/dL
Nitrite: NEGATIVE
Protein, ur: NEGATIVE mg/dL
Specific Gravity, Urine: 1.013 (ref 1.005–1.030)
Urobilinogen, UA: 1 mg/dL (ref 0.0–1.0)
pH: 7.5 (ref 5.0–8.0)

## 2012-02-24 LAB — COMPREHENSIVE METABOLIC PANEL
ALT: 89 U/L — ABNORMAL HIGH (ref 0–53)
AST: 43 U/L — ABNORMAL HIGH (ref 0–37)
Albumin: 3.7 g/dL (ref 3.5–5.2)
Alkaline Phosphatase: 61 U/L (ref 39–117)
BUN: 12 mg/dL (ref 6–23)
CO2: 23 mEq/L (ref 19–32)
Calcium: 9.4 mg/dL (ref 8.4–10.5)
Chloride: 104 mEq/L (ref 96–112)
Creatinine, Ser: 0.85 mg/dL (ref 0.50–1.35)
GFR calc Af Amer: >90 mL/min (ref >90)
GFR calc non Af Amer: >90 mL/min (ref >90)
Glucose, Bld: 97 mg/dL (ref 70–99)
Potassium: 3.7 mEq/L (ref 3.5–5.1)
Sodium: 137 mEq/L (ref 135–145)
Total Bilirubin: 0.3 mg/dL (ref 0.3–1.2)
Total Protein: 7.2 g/dL (ref 6.0–8.3)

## 2012-02-24 LAB — CBC
HCT: 42.2 % (ref 39.0–52.0)
Hemoglobin: 14.6 g/dL (ref 13.0–17.0)
MCH: 27.7 pg (ref 26.0–34.0)
MCHC: 34.6 g/dL (ref 30.0–36.0)
MCV: 80.1 fL (ref 78.0–100.0)
Platelets: 240 10*3/uL (ref 150–400)
RBC: 5.27 MIL/uL (ref 4.22–5.81)
RDW: 14.5 % (ref 11.5–15.5)
WBC: 8.6 10*3/uL (ref 4.0–10.5)

## 2012-02-24 LAB — URINE MICROSCOPIC-ADD ON

## 2012-02-24 LAB — TSH: TSH: 1.954 u[IU]/mL (ref 0.350–4.500)

## 2012-02-24 LAB — URIC ACID: Uric Acid, Serum: 6.5 mg/dL (ref 4.0–7.8)

## 2012-02-24 LAB — GLUCOSE, CAPILLARY
Glucose-Capillary: 97 mg/dL (ref 70–99)
Glucose-Capillary: 121 mg/dL — ABNORMAL HIGH (ref 70–99)

## 2012-02-24 MED ORDER — SERTRALINE HCL 100 MG PO TABS
100.0000 mg | ORAL_TABLET | Freq: Every day | ORAL | Status: DC
  Administered 2012-02-24: 100 mg via ORAL
  Filled 2012-02-24 (×2): qty 1

## 2012-02-24 MED ORDER — POTASSIUM CHLORIDE CRYS ER 20 MEQ PO TBCR
EXTENDED_RELEASE_TABLET | ORAL | Status: AC
  Filled 2012-02-24: qty 2

## 2012-02-24 MED ORDER — ONDANSETRON HCL 4 MG/2ML IJ SOLN: 4.0000 mg | Freq: Four times a day (QID) | INTRAMUSCULAR | Status: DC | PRN

## 2012-02-24 MED ORDER — INSULIN ASPART 100 UNIT/ML ~~LOC~~ SOLN: 0.0000 [IU] | Freq: Three times a day (TID) | SUBCUTANEOUS | Status: DC

## 2012-02-24 MED ORDER — QUETIAPINE FUMARATE 400 MG PO TABS
400.0000 mg | ORAL_TABLET | Freq: Every day | ORAL | Status: DC
  Filled 2012-02-24: qty 1

## 2012-02-24 MED ORDER — ONDANSETRON HCL 4 MG PO TABS
4.0000 mg | ORAL_TABLET | Freq: Four times a day (QID) | ORAL | Status: DC | PRN
  Administered 2012-02-24: 4 mg via ORAL
  Filled 2012-02-24: qty 1

## 2012-02-24 MED ORDER — ASPIRIN 81 MG PO TBEC: 81.0000 mg | DELAYED_RELEASE_TABLET | Freq: Every day | ORAL | Status: DC

## 2012-02-24 MED ORDER — OXYCODONE HCL 5 MG PO TABS: 5.0000 mg | ORAL_TABLET | ORAL | Status: DC | PRN

## 2012-02-24 NOTE — Progress Notes (Signed)
Provided patient with sling per orders and showed him how to properly apply the sling. Patient verbalized understanding and was very pleased with the comfort it provided. No other changes noted. Will continue to assess and monitor.

## 2012-02-24 NOTE — Progress Notes (Addendum)
Subjective: Mr. Lieske is doing well this morning, he is resting in bed. He complains of R arm numbness and pain, but then winces in pain when nursing staff changes IV dressing. He does not remember any falls or trauma but he does not remember much of Thursday night after using cocaine. He states that he would like to go home. He had no further chest pain overnight.   In the ED, patient's mental status dramatically changed and he was back to baseline in several hours.   Objective: Vital signs in last 24 hours: Filed Vitals:   02/24/12 0343 02/24/12 0449 02/24/12 0758 02/24/12 0800  BP: 125/83  136/87 136/87  Pulse:    85  Temp:   98.1 F (36.7 C)   TempSrc:   Oral   Resp: 20  18   Height:      Weight:  244 lb 11.4 oz (111 kg)    SpO2: 97%  96%    Weight change:   Intake/Output Summary (Last 24 hours) at 02/24/12 1059 Last data filed at 02/24/12 0928  Gross per 24 hour  Intake    240 ml  Output   1025 ml  Net   -785 ml    Physical Exam Blood pressure 136/87, pulse 85, temperature 98.1 F (36.7 C), temperature source Oral, resp. rate 18, height 5\' 7"  (1.702 m), weight 244 lb 11.4 oz (111 kg), SpO2 96.00%. General:  No acute distress, alert and oriented x 3 HEENT:  PERRL, EOMI, moist mucous membranes Cardiovascular:  Regular rate and rhythm, no murmurs, rubs or gallops Respiratory:  Clear to auscultation bilaterally, no wheezes, rales, or rhonchi Abdomen:  Soft, nondistended, nontender, +bowel sounds Extremities:  Warm and well-perfused, no clubbing, cyanosis, or edema.  Skin: Warm, dry, no rashes Neuro: Cranial nerves intact, alert and oriented x3, strength 5/5 all extremities, subjective decreased sensation to light touch RUE from elbow to hand. Active and passive ROM is preserved, c/o pain in his elbow with internal and external rotation  Lab Results: CBC    Component Value Date/Time   WBC 8.6 02/24/2012 0445   RBC 5.27 02/24/2012 0445   HGB 14.6 02/24/2012 0445   HCT  42.2 02/24/2012 0445   PLT 240 02/24/2012 0445   MCV 80.1 02/24/2012 0445   MCH 27.7 02/24/2012 0445   MCHC 34.6 02/24/2012 0445   RDW 14.5 02/24/2012 0445   LYMPHSABS 3.4 02/23/2012 0646   MONOABS 0.9 02/23/2012 0646   EOSABS 0.2 02/23/2012 0646   BASOSABS 0.0 02/23/2012 0646    BMET    Component Value Date/Time   NA 137 02/24/2012 0445   K 3.7 02/24/2012 0445   CL 104 02/24/2012 0445   CO2 23 02/24/2012 0445   GLUCOSE 97 02/24/2012 0445   BUN 12 02/24/2012 0445   CREATININE 0.85 02/24/2012 0445   CALCIUM 9.4 02/24/2012 0445   GFRNONAA >90 02/24/2012 0445   GFRAA >90 02/24/2012 0445     Micro Results: Recent Results (from the past 240 hour(s))  MRSA PCR SCREENING     Status: Normal   Collection Time   02/23/12  6:30 PM      Component Value Range Status Comment   MRSA by PCR NEGATIVE  NEGATIVE Final     Studies/Results: Dg Shoulder Right  02/24/2012  *RADIOLOGY REPORT*  Clinical Data: Larey Seat yesterday.  Right shoulder pain.  RIGHT SHOULDER - 2+ VIEW  Comparison: None.  Findings: No fracture or dislocation.  There are no degenerative changes.  The  underlying ribs are intact.  Normal soft tissues.  IMPRESSION: Normal right shoulder radiographs.   Original Report Authenticated By: Amie Portland, M.D.    Dg Elbow Complete Right  02/24/2012  *RADIOLOGY REPORT*  Clinical Data: Larey Seat yesterday.  Right elbow pain.  RIGHT ELBOW - COMPLETE 3+ VIEW  Comparison: None.  Findings: No fracture.  The elbow joint is normally spaced and aligned.  No joint effusion.  Normal soft tissues.  IMPRESSION: Normal right elbow radiographs.   Original Report Authenticated By: Amie Portland, M.D.    Dg Wrist 2 Views Right  02/24/2012  *RADIOLOGY REPORT*  Clinical Data: Fall yesterday.  Right wrist pain.  RIGHT WRIST - 2 VIEW  Comparison: None.  Findings: No fracture or bone lesion.  The wrist joints are normally spaced and aligned and the soft tissues are unremarkable.  IMPRESSION: Normal right wrist  radiographs.   Original Report Authenticated By: Amie Portland, M.D.    Ct Head Wo Contrast  02/23/2012  *RADIOLOGY REPORT*  Clinical Data: Syncopal episode.  Frontal trauma.  CT HEAD WITHOUT CONTRAST  Technique:  Contiguous axial images were obtained from the base of the skull through the vertex without contrast.  Comparison: 09/10/2004  Findings: There is no evidence of old or acute infarction, mass lesion, hemorrhage, hydrocephalus or extra-axial collection.  No skull fracture.  Sinuses, middle ears and mastoids are clear.  IMPRESSION: Negative head CT   Original Report Authenticated By: Paulina Fusi, M.D.    Dg Chest Portable 1 View  02/23/2012  *RADIOLOGY REPORT*  Clinical Data: Chest pain and confusion.  PORTABLE CHEST - 1 VIEW  Comparison: 03/13/2010  Findings: Artifact overlies chest.  Heart size is normal. Mediastinal shadows are normal.  The patient has not taken a deep inspiration.  No evidence of infiltrate, collapse or effusion.  No edema.  No bony finding.  IMPRESSION: Poor inspiration.  No active disease suspected.   Original Report Authenticated By: Paulina Fusi, M.D.     Medications: medications reviewed Scheduled Meds:   . amLODipine  10 mg Oral Daily  . aspirin EC  81 mg Oral Daily  . [COMPLETED] aspirin  325 mg Oral Once  . atorvastatin  80 mg Oral q1800  . heparin  5,000 Units Subcutaneous Q8H  . [COMPLETED] HYDROcodone-acetaminophen  1 tablet Oral Once  . hydrocortisone  1 application Rectal BID  . insulin aspart  0-9 Units Subcutaneous TID WC  . lisinopril  40 mg Oral Daily  . loratadine  10 mg Oral Daily  . LORazepam  0-4 mg Oral Q6H   Followed by  . LORazepam  0-4 mg Oral Q12H  . multivitamin with minerals  1 tablet Oral Daily  . omega-3 acid ethyl esters  1 g Oral Daily  . pantoprazole  40 mg Oral Daily  . [EXPIRED] potassium chloride  40 mEq Oral Q4H  . sertraline  50 mg Oral QHS  . [COMPLETED] sodium chloride  1,000 mL Intravenous Once  . sodium chloride  3  mL Intravenous Q12H  . thiamine  100 mg Oral Daily   Or  . thiamine  100 mg Intravenous Daily  . [DISCONTINUED] amLODipine  10 mg Oral Daily  . [DISCONTINUED] enoxaparin (LOVENOX) injection  40 mg Subcutaneous Q24H  . [DISCONTINUED] HYDROcodone-acetaminophen  1 tablet Oral Once  . [DISCONTINUED] lisinopril  40 mg Oral Daily  . [DISCONTINUED] omeprazole  40 mg Oral Daily   Continuous Infusions:  PRN Meds:.LORazepam, LORazepam, morphine injection, nitroGLYCERIN, ondansetron (ZOFRAN) IV, ondansetron  Assessment/Plan:  Pt is a 51 y.o. yo male with a PMHx of bipolar disorder (multiple prior attempted overdoses, [facility] admission for SI), polysubstance abuse (cocaine, alcohol, tobacco), HTN, DM2 (not currently on therapy) who was admitted on 02/23/2012 with symptoms of chest pain and right arm pain, and also noted to be significantly somnolent during ED course.   Chest pain - resolved. Etiology unclear, however, patient has multiple risk factors for ischemic heart disease (including age, sex, tobacco abuse, cocaine abuse), as well, he has new LAD on EKG, which is of concern. Therefore, acute cardiac cause of pain must be evaluated, despite the atypical nature of his pain. Given crack-cocaine abuse, aortic dissection versus PTX or pneumomediastinum are also considered, however, felt to be less likely given lack of CXR findings. No indication on exam of MSK pain.  11/16: Chest pain now resolved, troponins negative. Ruled out ACS.  -cocaine abuse counseling  Altered mental status: resolved.  Patient presented in somnolent state, intermittently arousable. After several hours in the ED, his mental status improved and he was alert and responding to questions. The cause of his AMS is unclear, but his somnolence may have been caused by the double dose of Ambien that the patient took this morning, particularly if combined with his other multiple sedative medications. There was initially some concern for overdose  of his seroquel given initial hypotension, mild tachycardia, somnolence, confusion (which can be seen). Unfortunately, there are no serology tests to evaluate further and treatment is mostly supportive. Infectious cause of AMS was also considered in setting of leukocytosis, although thought to be less likely given lack of systemic symptoms and CXR/ UA not indicative of acute infection. Of note, ETOH < 11. Acetaminophen and salicylate levels normal   11/16: Patient's mental status back at baseline. Will appreciate psych recommendations. -cont CIWA  R arm numbness and pain Exam not suggestive of bony injuries. Plain films were unremarkable. Strength is preserved and there are no other focal neuro deficits, making stroke less likely. Patient may have had soft tissue injury from a fall or passing out as he does not remember last night. May have "tennis elbow"  -fracture ruled out -ibuprofen, f/u with PCP  Hematuria TNTC RBCs on UA -will repeat UA -may need to f/u with urology as outpatient  Diabetes mellitus, type 2 - not currently on home medications.  -A1c is 6.3 -may need to add oral agent, can f/u with PCP for further management   Hypertension - avoid BB in setting of cocaine abuse. BP mildly elevated on admission.  - Continue home lisinopril, amlodipine   HLD - not currently on statin, however, is on fish oil.  - Lipid panel with LDL 106 -cont fish oil, f/u as outpatient  Bipolar disorder - managed at [facility] on seroquel and sertraline. However, given concern for possible overdose of seroquel, will need to hold at present. States he is having ongoing auditory hallucinations and staying awake for multiple nights, although denies SI/HI at this time.  - Will continue home sertraline, add back seroquel as AMS resolved - Consult psychiatry to help in management of his antipsychotics.   Polysubstance abuse - ongoing cocaine, tobacco, alcohol.  - CIWA protocol.  -cessation  counseling   Gout - no indication of acute flare. On chronic indomethacin. Goal uric acid level of < 6.  - resume indomethacin on d/c  Hypokalemia - RESOLVED, possibly due to decreased oral intake, otherwise not on medications to precipitate. Can be seen sometimes in seroquel overdose (although  less so suspected given lack of hypotension and tachycardia).  -resolved   GERD  - PPI  PPX -[place] heparin  Dispo -pending repeat UA and psych  -will need close psych and PCP follow up     LOS: 1 day   [place] Ar 02/24/2012, 10:59 AM  Internal Medicine Teaching Service Attending Note Date: 02/24/2012  Patient name: [name]  Medical record number: [id number]  Date of birth: 03-Oct-1960    This patient has been seen and discussed with the house staff. Please see their note for complete details. I concur with their findings.  Aletta Edouard 02/24/2012, 1:44 PM

## 2012-02-24 NOTE — Consult Note (Signed)
Reason for Consult: Medication management for hallucinations Referring Physician: Dr. Marlana Salvage / Dr. Zackery Barefoot is an 51 y.o. male.  HPI: Patient was seen and chart reviewed. Patient was admitted for substernal chest pain after abusing crack cocaine. He has long history of mood disorder and substance abuse. He has previous history of depression and jumping in front of car in 99 after his three months old child died with SIDS. His fiance of one year old dies with the complication of annuerysm about year ago in hospital and he has been thinking about it and felt depressed and used crack cocaine. He smokes weed on and off and tobacco one pack a week. He denied recent use of alcohol and other illicit drugs. He has been disabled over six years and has [employer] benefits. He has been a patient of Dr. Casimiro Needle in [facility] hospital. He denied symptoms of mania and psychosis.   MSE: Patient was calm and cooperative. He was awake, alert and oriented x 4. He has normal speech and thought process. He has denied suicidal/homicidal ideations and no evidence of psychosis.   Past Medical History  Diagnosis Date  . Arthritis   . Hypertension   . Anxiety   . Bipolar affective disorder   . Depression   . Alcohol abuse   . Cocaine abuse     smokes crack-cocaine  . GERD (gastroesophageal reflux disease)   . Chronic back pain   . Tobacco abuse   . Hepatitis C   . Vitamin D deficiency   . Gout   . Hemorrhoids     History reviewed. No pertinent past surgical history.  Family History  Problem Relation Age of Onset  . Hypertension Brother   . Diabetes Brother   . Hypertension Mother   . Diabetes Mother   . Arthritis Mother   . Other Sister     bone disease    Social History:  reports that he quit smoking about 3 months ago. He does not have any smokeless tobacco history on file. He reports that he does not drink alcohol or use illicit drugs.  Allergies: No Known Allergies  Medications: I have reviewed  the patient's current medications.  Results for orders placed during the hospital encounter of 02/23/12 (from the past 48 hour(s))  CBC WITH DIFFERENTIAL     Status: Abnormal   Collection Time   02/23/12  6:46 AM      Component Value Range Comment   WBC 10.6 (*) 4.0 - 10.5 K/uL    RBC 5.57  4.22 - 5.81 MIL/uL    Hemoglobin 15.8  13.0 - 17.0 g/dL    HCT 40.9  81.1 - 91.4 %    MCV 80.3  78.0 - 100.0 fL    MCH 28.4  26.0 - 34.0 pg    MCHC 35.3  30.0 - 36.0 g/dL    RDW 78.2  95.6 - 21.3 %    Platelets 263  150 - 400 K/uL    Neutrophils Relative 57  43 - 77 %    Neutro Abs 6.0  1.7 - 7.7 K/uL    Lymphocytes Relative 33  12 - 46 %    Lymphs Abs 3.4  0.7 - 4.0 K/uL    Monocytes Relative 8  3 - 12 %    Monocytes Absolute 0.9  0.1 - 1.0 K/uL    Eosinophils Relative 2  0 - 5 %    Eosinophils Absolute 0.2  0.0 - 0.7 K/uL  Basophils Relative 0  0 - 1 %    Basophils Absolute 0.0  0.0 - 0.1 K/uL   COMPREHENSIVE METABOLIC PANEL     Status: Abnormal   Collection Time   02/23/12  6:46 AM      Component Value Range Comment   Sodium 137  135 - 145 mEq/L    Potassium 3.3 (*) 3.5 - 5.1 mEq/L    Chloride 101  96 - 112 mEq/L    CO2 24  19 - 32 mEq/L    Glucose, Bld 99  70 - 99 mg/dL    BUN 16  6 - 23 mg/dL    Creatinine, Ser 9.14  0.50 - 1.35 mg/dL    Calcium 9.5  8.4 - 78.2 mg/dL    Total Protein 8.0  6.0 - 8.3 g/dL    Albumin 4.1  3.5 - 5.2 g/dL    AST 46 (*) 0 - 37 U/L    ALT 99 (*) 0 - 53 U/L    Alkaline Phosphatase 63  39 - 117 U/L    Total Bilirubin 0.4  0.3 - 1.2 mg/dL    GFR calc non Af Amer >90  >90 mL/min    GFR calc Af Amer >90  >90 mL/min   ETHANOL     Status: Normal   Collection Time   02/23/12  6:46 AM      Component Value Range Comment   Alcohol, Ethyl (B) <11  0 - 11 mg/dL   POCT I-STAT TROPONIN I     Status: Normal   Collection Time   02/23/12  6:55 AM      Component Value Range Comment   Troponin i, poc 0.00  0.00 - 0.08 ng/mL    Comment 3            TROPONIN I      Status: Normal   Collection Time   02/23/12  7:16 AM      Component Value Range Comment   Troponin I <0.30  <0.30 ng/mL   URINE RAPID DRUG SCREEN (HOSP PERFORMED)     Status: Abnormal   Collection Time   02/23/12 10:22 AM      Component Value Range Comment   Opiates NONE DETECTED  NONE DETECTED    Cocaine POSITIVE (*) NONE DETECTED    Benzodiazepines NONE DETECTED  NONE DETECTED    Amphetamines NONE DETECTED  NONE DETECTED    Tetrahydrocannabinol POSITIVE (*) NONE DETECTED    Barbiturates NONE DETECTED  NONE DETECTED   URINALYSIS, ROUTINE W REFLEX MICROSCOPIC     Status: Abnormal   Collection Time   02/23/12 10:22 AM      Component Value Range Comment   Color, Urine ORANGE (*) YELLOW BIOCHEMICALS MAY BE AFFECTED BY COLOR   APPearance CLOUDY (*) CLEAR    Specific Gravity, Urine 1.017  1.005 - 1.030    pH 6.0  5.0 - 8.0    Glucose, UA NEGATIVE  NEGATIVE mg/dL    Hgb urine dipstick LARGE (*) NEGATIVE    Bilirubin Urine NEGATIVE  NEGATIVE    Ketones, ur NEGATIVE  NEGATIVE mg/dL    Protein, ur NEGATIVE  NEGATIVE mg/dL    Urobilinogen, UA 1.0  0.0 - 1.0 mg/dL    Nitrite NEGATIVE  NEGATIVE    Leukocytes, UA SMALL (*) NEGATIVE   URINE MICROSCOPIC-ADD ON     Status: Normal   Collection Time   02/23/12 10:22 AM      Component Value Range  Comment   Squamous Epithelial / LPF RARE  RARE    WBC, UA 0-2  <3 WBC/hpf    RBC / HPF TOO NUMEROUS TO COUNT  <3 RBC/hpf    Bacteria, UA RARE  RARE   LIPID PANEL     Status: Abnormal   Collection Time   02/23/12 12:45 PM      Component Value Range Comment   Cholesterol 171  0 - 200 mg/dL    Triglycerides 91  <960 mg/dL    HDL 47  >45 mg/dL    Total CHOL/HDL Ratio 3.6      VLDL 18  0 - 40 mg/dL    LDL Cholesterol 409 (*) 0 - 99 mg/dL   HEMOGLOBIN W1X     Status: Abnormal   Collection Time   02/23/12 12:45 PM      Component Value Range Comment   Hemoglobin A1C 6.3 (*) <5.7 %    Mean Plasma Glucose 134 (*) <117 mg/dL   TSH     Status:  Normal   Collection Time   02/23/12 12:45 PM      Component Value Range Comment   TSH 1.954  0.350 - 4.500 uIU/mL   MAGNESIUM     Status: Normal   Collection Time   02/23/12 12:45 PM      Component Value Range Comment   Magnesium 1.7  1.5 - 2.5 mg/dL   ACETAMINOPHEN LEVEL     Status: Normal   Collection Time   02/23/12 12:45 PM      Component Value Range Comment   Acetaminophen (Tylenol), Serum <15.0  10 - 30 ug/mL   SALICYLATE LEVEL     Status: Abnormal   Collection Time   02/23/12 12:45 PM      Component Value Range Comment   Salicylate Lvl <2.0 (*) 2.8 - 20.0 mg/dL   GLUCOSE, CAPILLARY     Status: Normal   Collection Time   02/23/12  5:46 PM      Component Value Range Comment   Glucose-Capillary 92  70 - 99 mg/dL   MRSA PCR SCREENING     Status: Normal   Collection Time   02/23/12  6:30 PM      Component Value Range Comment   MRSA by PCR NEGATIVE  NEGATIVE   TROPONIN I     Status: Normal   Collection Time   02/23/12  6:32 PM      Component Value Range Comment   Troponin I <0.30  <0.30 ng/mL   GLUCOSE, CAPILLARY     Status: Abnormal   Collection Time   02/23/12  9:15 PM      Component Value Range Comment   Glucose-Capillary 164 (*) 70 - 99 mg/dL   TROPONIN I     Status: Normal   Collection Time   02/24/12  1:03 AM      Component Value Range Comment   Troponin I <0.30  <0.30 ng/mL   COMPREHENSIVE METABOLIC PANEL     Status: Abnormal   Collection Time   02/24/12  4:45 AM      Component Value Range Comment   Sodium 137  135 - 145 mEq/L    Potassium 3.7  3.5 - 5.1 mEq/L    Chloride 104  96 - 112 mEq/L    CO2 23  19 - 32 mEq/L    Glucose, Bld 97  70 - 99 mg/dL    BUN 12  6 - 23 mg/dL  Creatinine, Ser 0.85  0.50 - 1.35 mg/dL    Calcium 9.4  8.4 - 16.1 mg/dL    Total Protein 7.2  6.0 - 8.3 g/dL    Albumin 3.7  3.5 - 5.2 g/dL    AST 43 (*) 0 - 37 U/L    ALT 89 (*) 0 - 53 U/L    Alkaline Phosphatase 61  39 - 117 U/L    Total Bilirubin 0.3  0.3 - 1.2 mg/dL     GFR calc non Af Amer >90  >90 mL/min    GFR calc Af Amer >90  >90 mL/min   CBC     Status: Normal   Collection Time   02/24/12  4:45 AM      Component Value Range Comment   WBC 8.6  4.0 - 10.5 K/uL    RBC 5.27  4.22 - 5.81 MIL/uL    Hemoglobin 14.6  13.0 - 17.0 g/dL    HCT 09.6  04.5 - 40.9 %    MCV 80.1  78.0 - 100.0 fL    MCH 27.7  26.0 - 34.0 pg    MCHC 34.6  30.0 - 36.0 g/dL    RDW 81.1  91.4 - 78.2 %    Platelets 240  150 - 400 K/uL   TROPONIN I     Status: Normal   Collection Time   02/24/12  4:45 AM      Component Value Range Comment   Troponin I <0.30  <0.30 ng/mL   URIC ACID     Status: Normal   Collection Time   02/24/12  4:45 AM      Component Value Range Comment   Uric Acid, Serum 6.5  4.0 - 7.8 mg/dL   GLUCOSE, CAPILLARY     Status: Normal   Collection Time   02/24/12  7:56 AM      Component Value Range Comment   Glucose-Capillary 97  70 - 99 mg/dL   URINALYSIS, ROUTINE W REFLEX MICROSCOPIC     Status: Abnormal   Collection Time   02/24/12  2:52 PM      Component Value Range Comment   Color, Urine YELLOW  YELLOW    APPearance CLEAR  CLEAR    Specific Gravity, Urine 1.013  1.005 - 1.030    pH 7.5  5.0 - 8.0    Glucose, UA NEGATIVE  NEGATIVE mg/dL    Hgb urine dipstick LARGE (*) NEGATIVE    Bilirubin Urine NEGATIVE  NEGATIVE    Ketones, ur NEGATIVE  NEGATIVE mg/dL    Protein, ur NEGATIVE  NEGATIVE mg/dL    Urobilinogen, UA 1.0  0.0 - 1.0 mg/dL    Nitrite NEGATIVE  NEGATIVE    Leukocytes, UA TRACE (*) NEGATIVE   URINE MICROSCOPIC-ADD ON     Status: Normal   Collection Time   02/24/12  2:52 PM      Component Value Range Comment   Squamous Epithelial / LPF RARE  RARE    WBC, UA 3-6  <3 WBC/hpf    RBC / HPF 21-50  <3 RBC/hpf     Dg Shoulder Right  02/24/2012  *RADIOLOGY REPORT*  Clinical Data: Larey Seat yesterday.  Right shoulder pain.  RIGHT SHOULDER - 2+ VIEW  Comparison: None.  Findings: No fracture or dislocation.  There are no degenerative changes.  The  underlying ribs are intact.  Normal soft tissues.  IMPRESSION: Normal right shoulder radiographs.   Original Report Authenticated By: Amie Portland, M.D.  Dg Elbow Complete Right  02/24/2012  *RADIOLOGY REPORT*  Clinical Data: Larey Seat yesterday.  Right elbow pain.  RIGHT ELBOW - COMPLETE 3+ VIEW  Comparison: None.  Findings: No fracture.  The elbow joint is normally spaced and aligned.  No joint effusion.  Normal soft tissues.  IMPRESSION: Normal right elbow radiographs.   Original Report Authenticated By: Amie Portland, M.D.    Dg Wrist 2 Views Right  02/24/2012  *RADIOLOGY REPORT*  Clinical Data: Fall yesterday.  Right wrist pain.  RIGHT WRIST - 2 VIEW  Comparison: None.  Findings: No fracture or bone lesion.  The wrist joints are normally spaced and aligned and the soft tissues are unremarkable.  IMPRESSION: Normal right wrist radiographs.   Original Report Authenticated By: Amie Portland, M.D.    Ct Head Wo Contrast  02/23/2012  *RADIOLOGY REPORT*  Clinical Data: Syncopal episode.  Frontal trauma.  CT HEAD WITHOUT CONTRAST  Technique:  Contiguous axial images were obtained from the base of the skull through the vertex without contrast.  Comparison: 09/10/2004  Findings: There is no evidence of old or acute infarction, mass lesion, hemorrhage, hydrocephalus or extra-axial collection.  No skull fracture.  Sinuses, middle ears and mastoids are clear.  IMPRESSION: Negative head CT   Original Report Authenticated By: Paulina Fusi, M.D.    US Renal  02/24/2012  *RADIOLOGY REPORT*  Clinical Data: Hematuria  RENAL/URINARY TRACT ULTRASOUND COMPLETE  Comparison:  CT abdomen pelvis dated 05/05/2008  Findings:  Right Kidney:  Measures 11.5 cm.  9 x 11 x 11 mm upper pole cyst. No hydronephrosis.  Left Kidney:  Measures 13.0 cm.  No mass or hydronephrosis.  Bladder:  Within normal limits.  IMPRESSION: 11 mm right upper pole cyst.  No hydronephrosis.   Original Report Authenticated By: Charline Bills, M.D.    Dg  Chest Portable 1 View  02/23/2012  *RADIOLOGY REPORT*  Clinical Data: Chest pain and confusion.  PORTABLE CHEST - 1 VIEW  Comparison: 03/13/2010  Findings: Artifact overlies chest.  Heart size is normal. Mediastinal shadows are normal.  The patient has not taken a deep inspiration.  No evidence of infiltrate, collapse or effusion.  No edema.  No bony finding.  IMPRESSION: Poor inspiration.  No active disease suspected.   Original Report Authenticated By: Paulina Fusi, M.D.     Positive for bad mood, behavior problems, bipolar, depression, illegal drug usage, sleep disturbance and tobacco use Blood pressure 139/81, pulse 107, temperature 98.8 F (37.1 C), temperature source Oral, resp. rate 18, height 5\' 7"  (1.702 m), weight 244 lb 11.4 oz (111 kg), SpO2 98.00%.   Assessment/Plan: Bipolar disorder, MRE is psychosis Cocaine abuse Bereaving   Recommended to increase zoloft 100 mg Daily for depression and hold seroquel until next out patient psych visit at [facility] during next month  Yaquelin Langelier,JANARDHAHA R. 02/24/2012, 4:23 PM

## 2012-02-25 LAB — GLUCOSE, CAPILLARY: Glucose-Capillary: 111 mg/dL — ABNORMAL HIGH (ref 70–99)

## 2012-02-25 MED ORDER — PANTOPRAZOLE SODIUM 40 MG PO TBEC: 40.0000 mg | DELAYED_RELEASE_TABLET | Freq: Every day | ORAL | Status: DC

## 2012-02-25 MED ORDER — ASPIRIN 81 MG PO TBEC: 81.0000 mg | DELAYED_RELEASE_TABLET | Freq: Every day | ORAL | Status: DC

## 2012-02-25 MED ORDER — SERTRALINE HCL 100 MG PO TABS: 100.0000 mg | ORAL_TABLET | Freq: Every day | ORAL | Status: DC

## 2012-02-25 NOTE — Discharge Summary (Signed)
Patient Name:  [name]  MRN: [id number]  PCP: No primary provider on file.  DOB:  24-Apr-1960       Date of Admission:  02/23/2012  Date of Discharge:  02/25/2012      Attending Physician: Dr. Aletta Edouard, MD         DISCHARGE DIAGNOSES: 1. Principal Problem: 2.  *Chest pain 3. Active Problems: 4.  Bipolar affective disorder 5.  Depression 6.  Alcohol abuse 7.  Polysubstance abuse 8.  GERD (gastroesophageal reflux disease) 9.  Altered mental status 10.  Gout 11.  Vitamin D deficiency 12.  Hepatitis C 13.  Tobacco abuse   DISPOSITION AND FOLLOW-UP: [name] is to follow-up with the listed providers as detailed below, at patient's visiting, please address following issues:  -f/u with PCP regarding hematuria -psychiatry follow up   Follow-up Information    Please follow up. (Dr. Arvella Nigh in [name] at Dec. 2013 )       Please follow up. (Dr. Candida Peeling in next week. Please call for appointment as you promised to me.[phone]))         Discharge Orders    Future Orders Please Complete By Expires   Diet - low sodium heart healthy      Increase activity slowly          DISCHARGE MEDICATIONS:   Medication List     As of 02/25/2012 10:19 AM    STOP taking these medications         omeprazole 20 MG capsule   Commonly known as: PRILOSEC      QUEtiapine 400 MG tablet   Commonly known as: SEROQUEL      TAKE these medications         acetaminophen 325 MG tablet   Commonly known as: TYLENOL   Take 325 mg by mouth every 6 (six) hours as needed.      amLODipine 10 MG tablet   Commonly known as: NORVASC   Take 10 mg by mouth daily.      aspirin 81 MG EC tablet   Take 1 tablet (81 mg total) by mouth daily.      capsaicin 0.025 % cream   Commonly known as: ZOSTRIX   Apply topically 2 (two) times daily.      EPINEPHrine 0.15 MG/0.3ML injection   Commonly known as: EPIPEN JR   Inject 0.15 mg into the muscle as needed.     ergocalciferol 50000 UNITS capsule   Commonly known as: VITAMIN D2   Take 50,000 Units by mouth once a week.      Fish Oil 1000 MG Caps   Take 2,000 mg by mouth daily.      HYDROcodone-acetaminophen 10-325 MG per tablet   Commonly known as: NORCO   Take 1 tablet by mouth every 8 (eight) hours as needed.      hydrocortisone 2.5 % rectal cream   Commonly known as: ANUSOL-HC   Place 1 application rectally 2 (two) times daily.      indomethacin 50 MG capsule   Commonly known as: INDOCIN   Take 50 mg by mouth 4 (four) times daily.      lisinopril 40 MG tablet   Commonly known as: PRINIVIL,ZESTRIL   Take 40 mg by mouth daily.      loratadine 10 MG tablet   Commonly known as: CLARITIN   Take 10 mg by mouth daily.      methocarbamol 750 MG tablet  Commonly known as: ROBAXIN   Take 750 mg by mouth 3 (three) times daily as needed.      omeprazole 20 MG tablet   Commonly known as: PRILOSEC OTC   Take 40 mg by mouth daily.      pantoprazole 40 MG tablet   Commonly known as: PROTONIX   Take 1 tablet (40 mg total) by mouth daily.      salsalate 750 MG tablet   Commonly known as: DISALCID   Take 750 mg by mouth 2 (two) times daily.      sertraline 100 MG tablet   Commonly known as: ZOLOFT   Take 1 tablet (100 mg total) by mouth at bedtime.      zolpidem 10 MG tablet   Commonly known as: AMBIEN   Take 5 mg by mouth at bedtime as needed.         CONSULTS:  Treatment Team:  Nehemiah Settle, MD    PROCEDURES PERFORMED:  Ct Abdomen Pelvis Wo Contrast  02/24/2012  *RADIOLOGY REPORT*  Clinical Data: Hematuria.  High blood pressure.  Polysubstance abuse.  Hepatitis C.  Diabetic.  Left flank pain  CT ABDOMEN AND PELVIS WITHOUT CONTRAST  Technique:  Multidetector CT imaging of the abdomen and pelvis was performed following the standard protocol without intravenous contrast.  Comparison: 05/05/2008.  Findings: Lung bases clear.  Coronary artery calcifications.  No  renal or ureteral calculi or findings to suggest renal collecting system obstruction.  Unenhanced imaging of the urinary bladder without mass identified. Noncontrast imaging without renal mass noted.  Minimally lobulated appearance of the liver. Evaluation of solid abdominal viscera is limited by lack of IV contrast.  Taking this limitation into account no focal hepatic, splenic, adrenal or pancreatic lesion.  No calcified gallstones.  Atherosclerotic type changes of the aorta and iliac arteries without aneurysmal dilation.  Scattered colonic diverticula without extraluminal bowel inflammatory process, free fluid or free air.  Appendix is unremarkable.  Portions of colon under distended and evaluation limited.  Under distended stomach with limit evaluation.  No gross abnormality.  Degenerative changes lumbar spine most notable L4-5 level without evidence of bony destructive lesion.  IMPRESSION: No evidence of renal or ureteral stone or findings of hydronephrosis.  Unenhanced imaging without renal or bladder mass identified as a cause of the patient's hematuria.  Please see above.   Original Report Authenticated By: Lacy Duverney, M.D.    Dg Shoulder Right  02/24/2012  *RADIOLOGY REPORT*  Clinical Data: Larey Seat yesterday.  Right shoulder pain.  RIGHT SHOULDER - 2+ VIEW  Comparison: None.  Findings: No fracture or dislocation.  There are no degenerative changes.  The underlying ribs are intact.  Normal soft tissues.  IMPRESSION: Normal right shoulder radiographs.   Original Report Authenticated By: Amie Portland, M.D.    Dg Elbow Complete Right  02/24/2012  *RADIOLOGY REPORT*  Clinical Data: Larey Seat yesterday.  Right elbow pain.  RIGHT ELBOW - COMPLETE 3+ VIEW  Comparison: None.  Findings: No fracture.  The elbow joint is normally spaced and aligned.  No joint effusion.  Normal soft tissues.  IMPRESSION: Normal right elbow radiographs.   Original Report Authenticated By: Amie Portland, M.D.    Dg Wrist 2 Views  Right  02/24/2012  *RADIOLOGY REPORT*  Clinical Data: Fall yesterday.  Right wrist pain.  RIGHT WRIST - 2 VIEW  Comparison: None.  Findings: No fracture or bone lesion.  The wrist joints are normally spaced and aligned and the soft tissues are  unremarkable.  IMPRESSION: Normal right wrist radiographs.   Original Report Authenticated By: Amie Portland, M.D.    Ct Head Wo Contrast  02/23/2012  *RADIOLOGY REPORT*  Clinical Data: Syncopal episode.  Frontal trauma.  CT HEAD WITHOUT CONTRAST  Technique:  Contiguous axial images were obtained from the base of the skull through the vertex without contrast.  Comparison: 09/10/2004  Findings: There is no evidence of old or acute infarction, mass lesion, hemorrhage, hydrocephalus or extra-axial collection.  No skull fracture.  Sinuses, middle ears and mastoids are clear.  IMPRESSION: Negative head CT   Original Report Authenticated By: Paulina Fusi, M.D.    US Renal  02/24/2012  *RADIOLOGY REPORT*  Clinical Data: Hematuria  RENAL/URINARY TRACT ULTRASOUND COMPLETE  Comparison:  CT abdomen pelvis dated 05/05/2008  Findings:  Right Kidney:  Measures 11.5 cm.  9 x 11 x 11 mm upper pole cyst. No hydronephrosis.  Left Kidney:  Measures 13.0 cm.  No mass or hydronephrosis.  Bladder:  Within normal limits.  IMPRESSION: 11 mm right upper pole cyst.  No hydronephrosis.   Original Report Authenticated By: Charline Bills, M.D.    Dg Chest Portable 1 View  02/23/2012  *RADIOLOGY REPORT*  Clinical Data: Chest pain and confusion.  PORTABLE CHEST - 1 VIEW  Comparison: 03/13/2010  Findings: Artifact overlies chest.  Heart size is normal. Mediastinal shadows are normal.  The patient has not taken a deep inspiration.  No evidence of infiltrate, collapse or effusion.  No edema.  No bony finding.  IMPRESSION: Poor inspiration.  No active disease suspected.   Original Report Authenticated By: Paulina Fusi, M.D.      ADMISSION DATA: H&P:  Patient is a 51 y.o. male with a PMHx of  bipolar disorder (with history of multiple BH admissions for overdose and SI), polysubstance abuse (cocaine, alcohol, tobacco), and hepatitis C who presents to [facility] for evaluation of chest pain and right arm pain/ tingling that started approximately 2-3 days prior to admission but peaked early on the morning of admission. The patient states that his chest pain is present both at rest and with exertion, however he is unable to characterize it further or tell me any aggravating or alleviating factors. He states that he may have been sick recently with cough and congestion. The patient otherwise dates that he last smoked crack cocaine on the day prior to admission, and that he is very tired today because he has not slept for several days and took Ambien on the morning of admission. He otherwise denies intentional drug overdose, has not overused his home antipsychotics or pain medications, and states he has not used other illicit drugs (aside from [facility] and cocaine). In regards to his bipolar disorder, he states he is followed at the [facility], he confirms auditory hallucinations (they do not tell him to harm himself or others), but has not recently had visual hallucinations. He cannot tell me if he feels like he is having a manic episode.  Vital Signs: Blood pressure 136/95, pulse 90, resp. rate 29, SpO2 97.00%.  Physical Exam: General:  Vital signs reviewed and noted. Well-developed, well-nourished, in no acute distress; somnolent and falling asleep throughout exam, agitated with prolonged questioning.   Head:  Normocephalic, small 2cm abrasion on forehead above left eye.   Eyes:  Uncooperative to exam.   Nose:  Uncooperative to exam.   Throat:  Uncooperative to exam.   Neck:  No deformities, masses, or tenderness noted. Supple, no JVD.   Lungs:  Normal respiratory effort. Clear to auscultation BL without crackles or wheezes.   Heart:  RRR. S1 and S2 normal without gallop, rubs. (+) systolic  murmur.   Abdomen:   BS normoactive. Soft, Nondistended, mild diffuse TTP.  No masses or organomegaly.   Extremities:  No pretibial edema.   Neurologic:  A&O X3, CN II - XII are grossly intact. Motor strength is 5/5 in the all 4 extremities, Sensations intact to light touch.     Lab results: CBC:    Component  Value  Date/Time     WBC  10.6*  02/23/2012 0646     HGB  15.8  02/23/2012 0646     HCT  44.7  02/23/2012 0646     PLT  263  02/23/2012 0646     MCV  80.3  02/23/2012 0646     NEUTROABS  6.0  02/23/2012 0646     LYMPHSABS  3.4  02/23/2012 0646     MONOABS  0.9  02/23/2012 0646     EOSABS  0.2  02/23/2012 0646     BASOSABS  0.0  02/23/2012 0646     Metabolic Panel:    Component  Value  Date/Time     NA  137  02/23/2012 0646     K  3.3*  02/23/2012 0646     CL  101  02/23/2012 0646     CO2  24  02/23/2012 0646     BUN  16  02/23/2012 0646     CREATININE  0.88  02/23/2012 0646     GLUCOSE  99  02/23/2012 0646     CALCIUM  9.5  02/23/2012 0646     AST  46*  02/23/2012 0646     ALT  99*  02/23/2012 0646     ALKPHOS  63  02/23/2012 0646     BILITOT  0.4  02/23/2012 0646     PROT  8.0  02/23/2012 0646     ALBUMIN  4.1  02/23/2012 0646     Urinalysis:  Basename  02/23/12 1022   COLORURINE  ORANGE*   LABSPEC  1.017   PHURINE  6.0   GLUCOSEU  NEGATIVE   HGBUR  LARGE*   BILIRUBINUR  NEGATIVE   KETONESUR  NEGATIVE   PROTEINUR  NEGATIVE   UROBILINOGEN  1.0   NITRITE  NEGATIVE   LEUKOCYTESUR  SMALL*      Drugs of Abuse      Component  Value  Date/Time     LABOPIA  NONE DETECTED  02/23/2012 1022     COCAINSCRNUR  POSITIVE*  02/23/2012 1022     LABBENZ  NONE DETECTED  02/23/2012 1022     AMPHETMU  NONE DETECTED  02/23/2012 1022     THCU  POSITIVE*  02/23/2012 1022     LABBARB  NONE DETECTED  02/23/2012 1022     HOSPITAL COURSE:  # Chest pain - Etiology unclear, however, patient has multiple risk factors for ischemic heart disease (including age, sex,  tobacco abuse, cocaine abuse), as well, he has new LAD on EKG on admission. Patient was treated with Nitroglycerine, Morphin and ASA for chest pain. We trended his troponin which was negative  X4 and his chest pain resolved. Patient was positive for cocaine and THC. It was most likely that his chest pain was related to his cocaine abuse.  We did cocaine abuse counseling.  Patient is discharged at stable condition and is free of chest pain  at the discharge. Patient will follow up with his regular doctor, Dr. Candida Peeling in [facility] [phone]). He promised to call for an appointment in next week by himself. Will send discharge summary to his PCP.  # Altered mental status: resolved.  Patient presented in somnolent state, intermittently arousable. After several hours in the ED, his mental status improved and he was alert and responding to questions. The cause of his AMS is unclear, but his somnolence may have been caused by the double dose of Ambien that the patient took in the morning of admission, particularly if combined with his other multiple sedative medications. There was initially some concern for overdose of his seroquel given initial hypotension, mild tachycardia, somnolence, confusion (which can be seen). Infectious cause of AMS was also considered in setting of leukocytosis, although thought to be less likely given lack of systemic symptoms and CXR/ UA not indicative of acute infection. Of note, ETOH < 11. Acetaminophen and salicylate levels normal. Patient was positive for cocaine and THC, which may have contributed to his AMS. Patient's mental status was normal at discharge.  # R arm numbness and pain- Exam not suggestive of bony injuries. Plain films were unremarkable. Strength is preserved and there are no other focal neuro deficits, making stroke unlikely. Patient may have had soft tissue injury from a fall or passing out as he does not remember last night. May have "tennis elbow". Fracture was  ruled out by X-ray. Patient was treated with ibuprofen. He will follow up with his PCP in next week.  # Hematuria: TNTC RBCs on UA. Etiology is nor clear at this moment. CT-abd/Pelvis negative for kidney stone. Cre is normal. Repeated UA still showed RBC 21-50/HPF. Will let PCP follow up as outpatient setting.  May need to f/u with urology as outpatient. Will send discharge summary to his PCP.  # Diabetes mellitus, type 2 - not currently on home medications. A1c is 6.3. May need to add oral agent. He will f/u with PCP for further management..  # Hypertension - Avoided BB in setting of cocaine abuse. We continued his home lisinopril, amlodipine. His medications needs to be adjusted by his PCP/   # HLD - not currently on statin, however, is on fish oil.  Lipid panel with LDL 106. We continued his fish oil. Will f/u with PCP as outpatient.   # Bipolar disorder - managed at [facility] on seroquel and sertraline. However, given concern for possible overdose of seroquel, will need to hold at present. Denies SI/HI at this time.  Psychiatry was consulted, Dr. Elsie Saas evaluated patient and suggested to increase zoloft 100 mg daily for depression and hold seroquel until next out patient psych visit. Patient will be follow up with Psychiatry, Dr. Rudean Curt in Dec. 2013 in [facility]. He already had an appointment.   # Polysubstance abuse - ongoing cocaine, tobacco, alcohol. treated with CIWA protocol for possible alcohol withdraw. Did cessation counseling in hospital.  DISCHARGE DATA: Vital Signs: BP 161/100  Pulse 88  Temp 97.8 F (36.6 C) (Oral)  Resp 18  Ht 6' (1.829 m)  Wt 255 lb 1.2 oz (115.7 kg)  BMI 34.59 kg/m2  SpO2 93%  Labs: Results for orders placed during the hospital encounter of 02/23/12 (from the past 24 hour(s))  URINALYSIS, ROUTINE W REFLEX MICROSCOPIC     Status: Abnormal   Collection Time   02/24/12  2:52 PM      Component Value Range   Color, Urine YELLOW  YELLOW   APPearance CLEAR  CLEAR   Specific Gravity, Urine 1.013  1.005 - 1.030   pH 7.5  5.0 - 8.0   Glucose, UA NEGATIVE  NEGATIVE mg/dL   Hgb urine dipstick LARGE (*) NEGATIVE   Bilirubin Urine NEGATIVE  NEGATIVE   Ketones, ur NEGATIVE  NEGATIVE mg/dL   Protein, ur NEGATIVE  NEGATIVE mg/dL   Urobilinogen, UA 1.0  0.0 - 1.0 mg/dL   Nitrite NEGATIVE  NEGATIVE   Leukocytes, UA TRACE (*) NEGATIVE  URINE MICROSCOPIC-ADD ON     Status: Normal   Collection Time   02/24/12  2:52 PM      Component Value Range   Squamous Epithelial / LPF RARE  RARE   WBC, UA 3-6  <3 WBC/hpf   RBC / HPF 21-50  <3 RBC/hpf  GLUCOSE, CAPILLARY     Status: Abnormal   Collection Time   02/24/12  9:12 PM      Component Value Range   Glucose-Capillary 121 (*) 70 - 99 mg/dL   Comment 1 Notify RN    GLUCOSE, CAPILLARY     Status: Abnormal   Collection Time   02/25/12  7:18 AM      Component Value Range   Glucose-Capillary 111 (*) 70 - 99 mg/dL   Comment 1 Notify RN       Services Ordered on Discharge: Y = Yes; Blank = No PT:   OT:   RN:   Equipment:   Other:     Time Spent on Discharge: 35 min   Signed: Lorretta Harp, MD PGY 2, Internal Medicine Resident 02/25/2012, 10:19 AM

## 2012-02-25 NOTE — Progress Notes (Signed)
Subjective:  [name] feels normal today. He wants to go home. He complains of R arm pain which improved slightly compared to yesterday. He does not remember any falls or trauma but he does not remember much of Thursday night after using cocaine. He had no further chest pain overnight. Patient's mental status is back to baseline.   Objective: Vital signs in last 24 hours: Filed Vitals:   02/24/12 2300 02/25/12 0000 02/25/12 0131 02/25/12 0600  BP: 170/110 183/108 149/99 143/88  Pulse: 109 84  88  Temp: 98.2 F (36.8 C) 98.3 F (36.8 C)  97.8 F (36.6 C)  TempSrc: Oral Oral  Oral  Resp: 18 18  18   Height: 6' (1.829 m)     Weight: 254 lb 3.1 oz (115.3 kg)   255 lb 1.2 oz (115.7 kg)  SpO2: 95% 96%  93%   Weight change: 7 lb 7.9 oz (3.4 kg)  Intake/Output Summary (Last 24 hours) at 02/25/12 0916 Last data filed at 02/25/12 0558  Gross per 24 hour  Intake    610 ml  Output   1525 ml  Net   -915 ml    Physical Exam Blood pressure 143/88, pulse 88, temperature 97.8 F (36.6 C), temperature source Oral, resp. rate 18, height 6' (1.829 m), weight 255 lb 1.2 oz (115.7 kg), SpO2 93.00%. General:  No acute distress, alert and oriented x 3 HEENT:  PERRL, EOMI, moist mucous membranes Cardiovascular:  Regular rate and rhythm, no murmurs, rubs or gallops Respiratory:  Clear to auscultation bilaterally, no wheezes, rales, or rhonchi Abdomen:  Soft, nondistended, nontender, +bowel sounds Extremities:  Warm and well-perfused, no clubbing, cyanosis, or edema. Right arm is tender over forearm, but he has good pulse, without weakness or numbness.  Skin: Warm, dry, no rashes Neuro: Cranial nerves intact, alert and oriented x3, strength 5/5 all extremities, subjective decreased sensation to light touch RUE from elbow to hand. Active and passive ROM is preserved, c/o pain in his elbow with internal and external rotation  Lab Results: CBC    Component Value Date/Time   WBC 8.6 02/24/2012 0445     RBC 5.27 02/24/2012 0445   HGB 14.6 02/24/2012 0445   HCT 42.2 02/24/2012 0445   PLT 240 02/24/2012 0445   MCV 80.1 02/24/2012 0445   MCH 27.7 02/24/2012 0445   MCHC 34.6 02/24/2012 0445   RDW 14.5 02/24/2012 0445   LYMPHSABS 3.4 02/23/2012 0646   MONOABS 0.9 02/23/2012 0646   EOSABS 0.2 02/23/2012 0646   BASOSABS 0.0 02/23/2012 0646    BMET    Component Value Date/Time   NA 137 02/24/2012 0445   K 3.7 02/24/2012 0445   CL 104 02/24/2012 0445   CO2 23 02/24/2012 0445   GLUCOSE 97 02/24/2012 0445   BUN 12 02/24/2012 0445   CREATININE 0.85 02/24/2012 0445   CALCIUM 9.4 02/24/2012 0445   GFRNONAA >90 02/24/2012 0445   GFRAA >90 02/24/2012 0445     Micro Results: Recent Results (from the past 240 hour(s))  MRSA PCR SCREENING     Status: Normal   Collection Time   02/23/12  6:30 PM      Component Value Range Status Comment   MRSA by PCR NEGATIVE  NEGATIVE Final     Studies/Results: Ct Abdomen Pelvis Wo Contrast  02/24/2012  *RADIOLOGY REPORT*  Clinical Data: Hematuria.  High blood pressure.  Polysubstance abuse.  Hepatitis C.  Diabetic.  Left flank pain  CT ABDOMEN AND PELVIS WITHOUT  CONTRAST  Technique:  Multidetector CT imaging of the abdomen and pelvis was performed following the standard protocol without intravenous contrast.  Comparison: 05/05/2008.  Findings: Lung bases clear.  Coronary artery calcifications.  No renal or ureteral calculi or findings to suggest renal collecting system obstruction.  Unenhanced imaging of the urinary bladder without mass identified. Noncontrast imaging without renal mass noted.  Minimally lobulated appearance of the liver. Evaluation of solid abdominal viscera is limited by lack of IV contrast.  Taking this limitation into account no focal hepatic, splenic, adrenal or pancreatic lesion.  No calcified gallstones.  Atherosclerotic type changes of the aorta and iliac arteries without aneurysmal dilation.  Scattered colonic diverticula without  extraluminal bowel inflammatory process, free fluid or free air.  Appendix is unremarkable.  Portions of colon under distended and evaluation limited.  Under distended stomach with limit evaluation.  No gross abnormality.  Degenerative changes lumbar spine most notable L4-5 level without evidence of bony destructive lesion.  IMPRESSION: No evidence of renal or ureteral stone or findings of hydronephrosis.  Unenhanced imaging without renal or bladder mass identified as a cause of the patient's hematuria.  Please see above.   Original Report Authenticated By: Lacy Duverney, M.D.    Dg Shoulder Right  02/24/2012  *RADIOLOGY REPORT*  Clinical Data: Larey Seat yesterday.  Right shoulder pain.  RIGHT SHOULDER - 2+ VIEW  Comparison: None.  Findings: No fracture or dislocation.  There are no degenerative changes.  The underlying ribs are intact.  Normal soft tissues.  IMPRESSION: Normal right shoulder radiographs.   Original Report Authenticated By: Amie Portland, M.D.    Dg Elbow Complete Right  02/24/2012  *RADIOLOGY REPORT*  Clinical Data: Larey Seat yesterday.  Right elbow pain.  RIGHT ELBOW - COMPLETE 3+ VIEW  Comparison: None.  Findings: No fracture.  The elbow joint is normally spaced and aligned.  No joint effusion.  Normal soft tissues.  IMPRESSION: Normal right elbow radiographs.   Original Report Authenticated By: Amie Portland, M.D.    Dg Wrist 2 Views Right  02/24/2012  *RADIOLOGY REPORT*  Clinical Data: Fall yesterday.  Right wrist pain.  RIGHT WRIST - 2 VIEW  Comparison: None.  Findings: No fracture or bone lesion.  The wrist joints are normally spaced and aligned and the soft tissues are unremarkable.  IMPRESSION: Normal right wrist radiographs.   Original Report Authenticated By: Amie Portland, M.D.    Ct Head Wo Contrast  02/23/2012  *RADIOLOGY REPORT*  Clinical Data: Syncopal episode.  Frontal trauma.  CT HEAD WITHOUT CONTRAST  Technique:  Contiguous axial images were obtained from the base of the skull  through the vertex without contrast.  Comparison: 09/10/2004  Findings: There is no evidence of old or acute infarction, mass lesion, hemorrhage, hydrocephalus or extra-axial collection.  No skull fracture.  Sinuses, middle ears and mastoids are clear.  IMPRESSION: Negative head CT   Original Report Authenticated By: Paulina Fusi, M.D.    US Renal  02/24/2012  *RADIOLOGY REPORT*  Clinical Data: Hematuria  RENAL/URINARY TRACT ULTRASOUND COMPLETE  Comparison:  CT abdomen pelvis dated 05/05/2008  Findings:  Right Kidney:  Measures 11.5 cm.  9 x 11 x 11 mm upper pole cyst. No hydronephrosis.  Left Kidney:  Measures 13.0 cm.  No mass or hydronephrosis.  Bladder:  Within normal limits.  IMPRESSION: 11 mm right upper pole cyst.  No hydronephrosis.   Original Report Authenticated By: Charline Bills, M.D.     Medications: medications reviewed Scheduled Meds:    .  amLODipine  10 mg Oral Daily  . aspirin EC  81 mg Oral Daily  . atorvastatin  80 mg Oral q1800  . heparin  5,000 Units Subcutaneous Q8H  . hydrocortisone  1 application Rectal BID  . insulin aspart  0-9 Units Subcutaneous TID WC  . lisinopril  40 mg Oral Daily  . loratadine  10 mg Oral Daily  . LORazepam  0-4 mg Oral Q6H   Followed by  . LORazepam  0-4 mg Oral Q12H  . multivitamin with minerals  1 tablet Oral Daily  . omega-3 acid ethyl esters  1 g Oral Daily  . pantoprazole  40 mg Oral Daily  . sertraline  100 mg Oral QHS  . sodium chloride  3 mL Intravenous Q12H  . thiamine  100 mg Oral Daily   Or  . thiamine  100 mg Intravenous Daily  . [DISCONTINUED] insulin aspart  0-9 Units Subcutaneous TID WC  . [DISCONTINUED] QUEtiapine  400 mg Oral QHS  . [DISCONTINUED] sertraline  50 mg Oral QHS   Continuous Infusions:  PRN Meds:.LORazepam, LORazepam, morphine injection, nitroGLYCERIN, ondansetron (ZOFRAN) IV, ondansetron, oxyCODONE  Assessment/Plan:  Pt is a 51 y.o. yo male with a PMHx of bipolar disorder (multiple prior attempted  overdoses, [facility] admission for SI), polysubstance abuse (cocaine, alcohol, tobacco), HTN, DM2 (not currently on therapy) who was admitted on 02/23/2012 with symptoms of chest pain and right arm pain, and also noted to be significantly somnolent during ED course.   Chest pain - resolved. Etiology unclear, however, patient has multiple risk factors for ischemic heart disease (including age, sex, tobacco abuse, cocaine abuse), as well, he has new LAD on EKG, which is of concern. Therefore, acute cardiac cause of pain must be evaluated, despite the atypical nature of his pain. Given crack-cocaine abuse, aortic dissection versus PTX or pneumomediastinum are also considered, however, felt to be less likely given lack of CXR findings. No indication on exam of MSK pain.  11/16: Chest pain now resolved, troponins negative. Ruled out ACS.  -cocaine abuse counseling  Altered mental status: resolved.  Patient presented in somnolent state, intermittently arousable. After several hours in the ED, his mental status improved and he was alert and responding to questions. The cause of his AMS is unclear, but his somnolence may have been caused by the double dose of Ambien that the patient took this morning, particularly if combined with his other multiple sedative medications. There was initially some concern for overdose of his seroquel given initial hypotension, mild tachycardia, somnolence, confusion (which can be seen). Infectious cause of AMS was also considered in setting of leukocytosis, although thought to be less likely given lack of systemic symptoms and CXR/ UA not indicative of acute infection. Of note, ETOH < 11. Acetaminophen and salicylate levels normal. Patient was positive for cocaine and THC, which may have contributed to his AMS.  11/16: Patient's mental status back at baseline. Will appreciate psych recommendations. -cont CIWA  R arm numbness and pain Exam not suggestive of bony injuries. Plain films  were unremarkable. Strength is preserved and there are no other focal neuro deficits, making stroke less likely. Patient may have had soft tissue injury from a fall or passing out as he does not remember last night. May have "tennis elbow"  -fracture ruled out -ibuprofen, f/u with PCP  Hematuria TNTC RBCs on UA. Etiology is nor clear at this moment. CT-abd/Pelvis negative for kidney stone. Cre is normal. Repeated UA still showed RBC 21-50/HPF.  -  Will let PCP follow up as outpatient setting.  -May need to f/u with urology as outpatient  Diabetes mellitus, type 2 - not currently on home medications.  -A1c is 6.3 -may need to add oral agent, can f/u with PCP for further management   Hypertension - avoid BB in setting of cocaine abuse. BP mildly elevated on admission.  - Continue home lisinopril, amlodipine   HLD - not currently on statin, however, is on fish oil.  - Lipid panel with LDL 106 -cont fish oil, f/u as outpatient  Bipolar disorder - managed at [facility] on seroquel and sertraline. However, given concern for possible overdose of seroquel, will need to hold at present. Denies SI/HI at this time.  Psychiatry was consulted, Dr. Elsie Saas evaluated patient and suggested to increase zoloft 100 mg daily for depression and hold seroquel until next out patient psych visit    - Appreciated psychiatry consultation in managing our patient.  - Will continue Zoloft 100 mg daily.  Polysubstance abuse - ongoing cocaine, tobacco, alcohol.  - CIWA protocol.  -cessation counseling   Gout - no indication of acute flare. On chronic indomethacin. Goal uric acid level of < 6.  - resume indomethacin on d/c  Hypokalemia - RESOLVED, possibly due to decreased oral intake, otherwise not on medications to precipitate. Can be seen sometimes in seroquel overdose (although less so suspected given lack of hypotension and tachycardia).  -resolved   GERD  - PPI  PPX -[facility] heparin  Dispo  -will  d/c home today. -Patient will be follow up with his regular doctor, Dr. Candida Peeling in [facility] [phone]). He promised to call for an appointment in next week by himself; with Psychiatry, Dr. Rudean Curt in Dec. 2013 in [facility]. He already had an appointment.     LOS: 2 days    [name] 02/25/2012, 9:16 AM

## 2012-02-28 NOTE — Discharge Summary (Signed)
Internal Medicine Teaching Service Attending Note Date: 02/28/2012  Patient name: [name]  Medical record number: [id number]  Date of birth: 07/06/1960    I evaluated the patient on the day of discharge and discussed the discharge plan with my resident team. I agree with the discharge documentation and disposition.  Thank you for working with me on this patient's care.   Thanks Aletta Edouard 02/28/2012, 4:08 PM

## 2012-03-01 LAB — CULTURE, BLOOD (ROUTINE X 2)
Culture: NO GROWTH
Culture: NO GROWTH

## 2012-03-20 ENCOUNTER — Emergency Department (HOSPITAL_COMMUNITY): Payer: PRIVATE HEALTH INSURANCE

## 2012-03-20 ENCOUNTER — Encounter (HOSPITAL_COMMUNITY): Payer: Self-pay

## 2012-03-20 ENCOUNTER — Emergency Department (HOSPITAL_COMMUNITY)
Admission: EM | Admit: 2012-03-20 | Discharge: 2012-03-20 | Disposition: A | Discharge status: Other Acute Inpatient Hospital | Payer: PRIVATE HEALTH INSURANCE | Attending: Emergency Medicine | Admitting: Emergency Medicine

## 2012-03-20 ENCOUNTER — Inpatient Hospital Stay (HOSPITAL_COMMUNITY)
Admission: AD | Admit: 2012-03-20 | Discharge: 2012-03-28 | DRG: 885 | Disposition: A | Discharge status: Home / Self Care | Payer: PRIVATE HEALTH INSURANCE | Source: Ambulatory Visit | Attending: Psychiatry | Admitting: Psychiatry

## 2012-03-20 ENCOUNTER — Encounter (HOSPITAL_COMMUNITY): Payer: Self-pay | Admitting: *Deleted

## 2012-03-20 DIAGNOSIS — F10239 Alcohol dependence with withdrawal, unspecified: Secondary | ICD-10-CM

## 2012-03-20 DIAGNOSIS — F32A Depression, unspecified: Secondary | ICD-10-CM

## 2012-03-20 DIAGNOSIS — M549 Dorsalgia, unspecified: Secondary | ICD-10-CM | POA: Diagnosis present

## 2012-03-20 DIAGNOSIS — G4700 Insomnia, unspecified: Secondary | ICD-10-CM | POA: Insufficient documentation

## 2012-03-20 DIAGNOSIS — Z8659 Personal history of other mental and behavioral disorders: Secondary | ICD-10-CM | POA: Insufficient documentation

## 2012-03-20 DIAGNOSIS — K219 Gastro-esophageal reflux disease without esophagitis: Secondary | ICD-10-CM | POA: Diagnosis present

## 2012-03-20 DIAGNOSIS — B1920 Unspecified viral hepatitis C without hepatic coma: Secondary | ICD-10-CM | POA: Diagnosis present

## 2012-03-20 DIAGNOSIS — E559 Vitamin D deficiency, unspecified: Secondary | ICD-10-CM | POA: Diagnosis present

## 2012-03-20 DIAGNOSIS — F329 Major depressive disorder, single episode, unspecified: Secondary | ICD-10-CM

## 2012-03-20 DIAGNOSIS — I10 Essential (primary) hypertension: Secondary | ICD-10-CM | POA: Diagnosis present

## 2012-03-20 DIAGNOSIS — K649 Unspecified hemorrhoids: Secondary | ICD-10-CM | POA: Diagnosis present

## 2012-03-20 DIAGNOSIS — M109 Gout, unspecified: Secondary | ICD-10-CM | POA: Diagnosis present

## 2012-03-20 DIAGNOSIS — F3130 Bipolar disorder, current episode depressed, mild or moderate severity, unspecified: Secondary | ICD-10-CM | POA: Diagnosis present

## 2012-03-20 DIAGNOSIS — R45850 Homicidal ideations: Secondary | ICD-10-CM

## 2012-03-20 DIAGNOSIS — E119 Type 2 diabetes mellitus without complications: Secondary | ICD-10-CM | POA: Diagnosis present

## 2012-03-20 DIAGNOSIS — F319 Bipolar disorder, unspecified: Secondary | ICD-10-CM | POA: Insufficient documentation

## 2012-03-20 DIAGNOSIS — F1020 Alcohol dependence, uncomplicated: Secondary | ICD-10-CM | POA: Diagnosis present

## 2012-03-20 DIAGNOSIS — F1920 Other psychoactive substance dependence, uncomplicated: Secondary | ICD-10-CM | POA: Diagnosis present

## 2012-03-20 DIAGNOSIS — R45851 Suicidal ideations: Secondary | ICD-10-CM

## 2012-03-20 DIAGNOSIS — Z87891 Personal history of nicotine dependence: Secondary | ICD-10-CM | POA: Insufficient documentation

## 2012-03-20 DIAGNOSIS — G8929 Other chronic pain: Secondary | ICD-10-CM | POA: Diagnosis present

## 2012-03-20 DIAGNOSIS — F419 Anxiety disorder, unspecified: Secondary | ICD-10-CM

## 2012-03-20 DIAGNOSIS — Z79899 Other long term (current) drug therapy: Secondary | ICD-10-CM | POA: Insufficient documentation

## 2012-03-20 DIAGNOSIS — M129 Arthropathy, unspecified: Secondary | ICD-10-CM | POA: Diagnosis present

## 2012-03-20 DIAGNOSIS — Z8739 Personal history of other diseases of the musculoskeletal system and connective tissue: Secondary | ICD-10-CM | POA: Insufficient documentation

## 2012-03-20 DIAGNOSIS — F10939 Alcohol use, unspecified with withdrawal, unspecified: Secondary | ICD-10-CM

## 2012-03-20 DIAGNOSIS — F1010 Alcohol abuse, uncomplicated: Secondary | ICD-10-CM

## 2012-03-20 DIAGNOSIS — F1910 Other psychoactive substance abuse, uncomplicated: Secondary | ICD-10-CM

## 2012-03-20 DIAGNOSIS — Z8719 Personal history of other diseases of the digestive system: Secondary | ICD-10-CM | POA: Insufficient documentation

## 2012-03-20 LAB — COMPREHENSIVE METABOLIC PANEL
ALT: 158 U/L — ABNORMAL HIGH (ref 0–53)
AST: 75 U/L — ABNORMAL HIGH (ref 0–37)
Albumin: 4 g/dL (ref 3.5–5.2)
Alkaline Phosphatase: 54 U/L (ref 39–117)
BUN: 8 mg/dL (ref 6–23)
CO2: 22 mEq/L (ref 19–32)
Calcium: 9.3 mg/dL (ref 8.4–10.5)
Chloride: 106 mEq/L (ref 96–112)
Creatinine, Ser: 1.03 mg/dL (ref 0.50–1.35)
GFR calc Af Amer: >90 mL/min (ref >90)
GFR calc non Af Amer: 82 mL/min — ABNORMAL LOW (ref >90)
Glucose, Bld: 122 mg/dL — ABNORMAL HIGH (ref 70–99)
Potassium: 3 mEq/L — ABNORMAL LOW (ref 3.5–5.1)
Sodium: 141 mEq/L (ref 135–145)
Total Bilirubin: 0.4 mg/dL (ref 0.3–1.2)
Total Protein: 7.5 g/dL (ref 6.0–8.3)

## 2012-03-20 LAB — ACETAMINOPHEN LEVEL: Acetaminophen (Tylenol), Serum: <15 ug/mL (ref 10–30)

## 2012-03-20 LAB — CBC WITH DIFFERENTIAL/PLATELET
Basophils Absolute: 0 10*3/uL (ref 0.0–0.1)
Basophils Relative: 0 % (ref 0–1)
Eosinophils Absolute: 0.2 10*3/uL (ref 0.0–0.7)
Eosinophils Relative: 2 % (ref 0–5)
HCT: 40.8 % (ref 39.0–52.0)
Hemoglobin: 14.2 g/dL (ref 13.0–17.0)
Lymphocytes Relative: 28 % (ref 12–46)
Lymphs Abs: 2.4 10*3/uL (ref 0.7–4.0)
MCH: 28 pg (ref 26.0–34.0)
MCHC: 34.8 g/dL (ref 30.0–36.0)
MCV: 80.3 fL (ref 78.0–100.0)
Monocytes Absolute: 0.6 10*3/uL (ref 0.1–1.0)
Monocytes Relative: 7 % (ref 3–12)
Neutro Abs: 5.4 10*3/uL (ref 1.7–7.7)
Neutrophils Relative %: 63 % (ref 43–77)
Platelets: 248 10*3/uL (ref 150–400)
RBC: 5.08 MIL/uL (ref 4.22–5.81)
RDW: 14.4 % (ref 11.5–15.5)
WBC: 8.5 10*3/uL (ref 4.0–10.5)

## 2012-03-20 LAB — URINE MICROSCOPIC-ADD ON

## 2012-03-20 LAB — URINALYSIS, ROUTINE W REFLEX MICROSCOPIC
Bilirubin Urine: NEGATIVE
Glucose, UA: NEGATIVE mg/dL
Hgb urine dipstick: NEGATIVE
Ketones, ur: NEGATIVE mg/dL
Nitrite: NEGATIVE
Protein, ur: NEGATIVE mg/dL
Specific Gravity, Urine: 1.029 (ref 1.005–1.030)
Urobilinogen, UA: 0.2 mg/dL (ref 0.0–1.0)
pH: 6.5 (ref 5.0–8.0)

## 2012-03-20 LAB — RAPID URINE DRUG SCREEN, HOSP PERFORMED
Amphetamines: NOT DETECTED
Barbiturates: NOT DETECTED
Benzodiazepines: NOT DETECTED
Cocaine: POSITIVE — AB
Opiates: NOT DETECTED
Tetrahydrocannabinol: POSITIVE — AB

## 2012-03-20 LAB — TROPONIN I: Troponin I: <0.3 ng/mL (ref <0.30)

## 2012-03-20 LAB — SALICYLATE LEVEL: Salicylate Lvl: 2 mg/dL — ABNORMAL LOW (ref 2.8–20.0)

## 2012-03-20 LAB — ETHANOL: Alcohol, Ethyl (B): <11 mg/dL (ref 0–11)

## 2012-03-20 MED ORDER — TRAZODONE HCL 50 MG PO TABS
50.0000 mg | ORAL_TABLET | Freq: Every evening | ORAL | Status: DC | PRN
  Administered 2012-03-24: 50 mg via ORAL
  Filled 2012-03-20 (×2): qty 1

## 2012-03-20 MED ORDER — MAGNESIUM HYDROXIDE 400 MG/5ML PO SUSP: 30.0000 mL | Freq: Every day | ORAL | Status: DC | PRN

## 2012-03-20 MED ORDER — ALUM & MAG HYDROXIDE-SIMETH 200-200-20 MG/5ML PO SUSP
30.0000 mL | ORAL | Status: DC | PRN
  Administered 2012-03-21 – 2012-03-25 (×2): 30 mL via ORAL

## 2012-03-20 MED ORDER — ACETAMINOPHEN 325 MG PO TABS
650.0000 mg | ORAL_TABLET | Freq: Four times a day (QID) | ORAL | Status: DC | PRN
  Administered 2012-03-21: 650 mg via ORAL

## 2012-03-20 MED ORDER — POTASSIUM CHLORIDE CRYS ER 20 MEQ PO TBCR
40.0000 meq | EXTENDED_RELEASE_TABLET | Freq: Once | ORAL | Status: AC
  Administered 2012-03-20: 40 meq via ORAL

## 2012-03-20 MED ORDER — AMLODIPINE BESYLATE 10 MG PO TABS
10.0000 mg | ORAL_TABLET | Freq: Every day | ORAL | Status: DC
  Administered 2012-03-21 – 2012-03-28 (×8): 10 mg via ORAL
  Filled 2012-03-20 (×10): qty 1

## 2012-03-20 MED ORDER — LISINOPRIL 40 MG PO TABS
40.0000 mg | ORAL_TABLET | Freq: Every day | ORAL | Status: DC
  Administered 2012-03-21 – 2012-03-28 (×8): 40 mg via ORAL
  Filled 2012-03-20 (×10): qty 1

## 2012-03-20 MED ORDER — POTASSIUM CHLORIDE CRYS ER 20 MEQ PO TBCR
EXTENDED_RELEASE_TABLET | ORAL | Status: AC
  Filled 2012-03-20: qty 2

## 2012-03-20 MED ORDER — LISINOPRIL 20 MG PO TABS: 40.0000 mg | ORAL_TABLET | Freq: Every day | ORAL | Status: DC

## 2012-03-20 MED ORDER — AMLODIPINE BESYLATE 10 MG PO TABS: 10.0000 mg | ORAL_TABLET | Freq: Every day | ORAL | Status: DC

## 2012-03-20 NOTE — ED Notes (Signed)
Pt reporting yesterday developed central cp after he used crack as an attempt to overdose. Pt reporting SI at this time. Reporting "I am depressed. I wanted detox. I feel like I want to bust my heart with crack and overdose with muscle relaxants."

## 2012-03-20 NOTE — ED Notes (Signed)
Pt was wanded by security. Belongings removed from the room.

## 2012-03-20 NOTE — Progress Notes (Signed)
Patient sad and depressed upon my assessment. Patient irritable during admission assessment, refused to sign "Restrictive Procedures Form" and "Treatment Agreement." Patient verbalizes "I am not going to go to any groups because I do not want to be here."  Patient endorses constant SI, contracts verbally with RN for safety. Patient denies HI at this time. Patient endorses A/V hallucinations, states "I see/talk to my deceased Fiance and daughter." Patient verbalizes he "attempted to overdose on ambien, muscle relaxers and $100 worth of crack." Patient states "I thought I would be dead right now." Patient had insulin in his belongings, stated "If my first plan didn't work I was going to overdose on insulin." Insulin remains in patient belongings, states "it belongs to my fiance." Patient states "my fiance died one year ago yesterday and my daughter died about five years ago." Patient states "I drink one 40 ounce beer daily or every other day." Patient states "I had a heart attack about a month ago." Patient noncomplaint with medications, stopped taking medications 3 months ago.  Patient advised of his low fall risk status, verbalizes understanding. Patient oriented to unit/room/staff, demonstrates understanding. Patient safe on unit with Q15 minute checks for safety. Will continue to monitor.

## 2012-03-20 NOTE — ED Provider Notes (Addendum)
History     CSN: [id number]  Arrival date & time 03/20/12  4098   First MD Initiated Contact with Patient 03/20/12 [phone]      Chief Complaint  Patient presents with  . Chest Pain    (Consider location/radiation/quality/duration/timing/severity/associated sxs/prior treatment) HPI Comments: Patient is a 51 year old man who smoked $200 worth of crack cocaine last night and this morning took 15 750 mg Robaxin, in an attempt to kill himself. He has been very depressed. He says everything has come down on him. He therefore sought evaluation and treatment for depression.  Patient is a 51 y.o. male presenting with mental health disorder. The history is provided by the patient and medical records. No language interpreter was used.  Mental Health Problem The primary symptoms include dysphoric mood. The current episode started yesterday. This is a recurrent problem.  The onset of the illness is precipitated by emotional stress. The degree of incapacity that he is experiencing as a consequence of his illness is severe. Additional symptoms of the illness include insomnia and appetite change. He admits to suicidal ideas. He does have a plan to commit suicide. He contemplates harming himself. He does not contemplate injuring another person. He has not already  injured another person. Risk factors that are present for mental illness include a history of mental illness and substance abuse.    Past Medical History  Diagnosis Date  . Arthritis   . Hypertension   . Anxiety   . Bipolar affective disorder   . Depression   . Alcohol abuse   . Cocaine abuse     smokes crack-cocaine  . GERD (gastroesophageal reflux disease)   . Chronic back pain   . Tobacco abuse   . Hepatitis C   . Vitamin D deficiency   . Gout   . Hemorrhoids     History reviewed. No pertinent past surgical history.  Family History  Problem Relation Age of Onset  . Hypertension Brother   . Diabetes Brother   . Hypertension  Mother   . Diabetes Mother   . Arthritis Mother   . Other Sister     bone disease    History  Substance Use Topics  . Smoking status: Former Smoker    Quit date: 11/23/2011  . Smokeless tobacco: Not on file  . Alcohol Use: No      Review of Systems  Constitutional: Positive for appetite change.  HENT: Negative.   Eyes: Negative.   Respiratory: Negative.   Cardiovascular: Positive for chest pain.  Gastrointestinal: Negative.   Genitourinary: Negative.   Musculoskeletal: Negative.   Skin: Negative.   Neurological: Negative.   Psychiatric/Behavioral: Positive for suicidal ideas and dysphoric mood. The patient has insomnia.     Allergies  Review of patient's allergies indicates no known allergies.  Home Medications   Current Outpatient Rx  Name  Route  Sig  Dispense  Refill  . AMLODIPINE BESYLATE 10 MG PO TABS   Oral   Take 10 mg by mouth daily.         Marland Kitchen LISINOPRIL 40 MG PO TABS   Oral   Take 40 mg by mouth daily.           BP 129/84  Pulse 86  Temp 97.7 F (36.5 C) (Oral)  Resp 15  SpO2 98%  Physical Exam  Nursing note and vitals reviewed. Constitutional: He is oriented to person, place, and time. He appears well-developed and well-nourished. No distress.  HENT:  Head: Normocephalic  and atraumatic.  Right Ear: External ear normal.  Left Ear: External ear normal.  Mouth/Throat: Oropharynx is clear and moist.  Eyes: Conjunctivae normal and EOM are normal. Pupils are equal, round, and reactive to light.  Neck: Normal range of motion. Neck supple.  Cardiovascular: Normal rate, regular rhythm and normal heart sounds.   Pulmonary/Chest: Effort normal and breath sounds normal.  Abdominal: Soft. Bowel sounds are normal.  Musculoskeletal: Normal range of motion.  Neurological: He is alert and oriented to person, place, and time.       No sensory or motor deficit  Skin: Skin is warm and dry.  Psychiatric:       Depressed affect, with attempted suicide  by smoking crack cocaine and taking muscle relaxant toes.    ED Course  Procedures (including critical care time)   Labs Reviewed  URINE RAPID DRUG SCREEN (HOSP PERFORMED)   9:57 AM  Date: 03/20/2012  Rate:80  Rhythm: normal sinus rhythm  QRS Axis: normal  Intervals: normal  ST/T Wave abnormalities: normal  Conduction Disutrbances:none  Narrative Interpretation: Normal EKG  Old EKG Reviewed: unchanged  10:23 AM Pt was seen and had physical examination.  Lab workup ordered.  Will move to Pod C for Psych Holding.  2:03 PM Results for orders placed during the hospital encounter of 03/20/12  URINE RAPID DRUG SCREEN (HOSP PERFORMED)      Component Value Range   Opiates NONE DETECTED  NONE DETECTED   Cocaine POSITIVE (*) NONE DETECTED   Benzodiazepines NONE DETECTED  NONE DETECTED   Amphetamines NONE DETECTED  NONE DETECTED   Tetrahydrocannabinol POSITIVE (*) NONE DETECTED   Barbiturates NONE DETECTED  NONE DETECTED  CBC WITH DIFFERENTIAL      Component Value Range   WBC 8.5  4.0 - 10.5 K/uL   RBC 5.08  4.22 - 5.81 MIL/uL   Hemoglobin 14.2  13.0 - 17.0 g/dL   HCT 91.4  78.2 - 95.6 %   MCV 80.3  78.0 - 100.0 fL   MCH 28.0  26.0 - 34.0 pg   MCHC 34.8  30.0 - 36.0 g/dL   RDW 21.3  08.6 - 57.8 %   Platelets 248  150 - 400 K/uL   Neutrophils Relative 63  43 - 77 %   Neutro Abs 5.4  1.7 - 7.7 K/uL   Lymphocytes Relative 28  12 - 46 %   Lymphs Abs 2.4  0.7 - 4.0 K/uL   Monocytes Relative 7  3 - 12 %   Monocytes Absolute 0.6  0.1 - 1.0 K/uL   Eosinophils Relative 2  0 - 5 %   Eosinophils Absolute 0.2  0.0 - 0.7 K/uL   Basophils Relative 0  0 - 1 %   Basophils Absolute 0.0  0.0 - 0.1 K/uL  COMPREHENSIVE METABOLIC PANEL      Component Value Range   Sodium 141  135 - 145 mEq/L   Potassium 3.0 (*) 3.5 - 5.1 mEq/L   Chloride 106  96 - 112 mEq/L   CO2 22  19 - 32 mEq/L   Glucose, Bld 122 (*) 70 - 99 mg/dL   BUN 8  6 - 23 mg/dL   Creatinine, Ser 4.69  0.50 - 1.35 mg/dL    Calcium 9.3  8.4 - 62.9 mg/dL   Total Protein 7.5  6.0 - 8.3 g/dL   Albumin 4.0  3.5 - 5.2 g/dL   AST 75 (*) 0 - 37 U/L   ALT 158 (*)  0 - 53 U/L   Alkaline Phosphatase 54  39 - 117 U/L   Total Bilirubin 0.4  0.3 - 1.2 mg/dL   GFR calc non Af Amer 82 (*) >90 mL/min   GFR calc Af Amer >90  >90 mL/min  URINALYSIS, ROUTINE W REFLEX MICROSCOPIC      Component Value Range   Color, Urine YELLOW  YELLOW   APPearance CLEAR  CLEAR   Specific Gravity, Urine 1.029  1.005 - 1.030   pH 6.5  5.0 - 8.0   Glucose, UA NEGATIVE  NEGATIVE mg/dL   Hgb urine dipstick NEGATIVE  NEGATIVE   Bilirubin Urine NEGATIVE  NEGATIVE   Ketones, ur NEGATIVE  NEGATIVE mg/dL   Protein, ur NEGATIVE  NEGATIVE mg/dL   Urobilinogen, UA 0.2  0.0 - 1.0 mg/dL   Nitrite NEGATIVE  NEGATIVE   Leukocytes, UA TRACE (*) NEGATIVE  ETHANOL      Component Value Range   Alcohol, Ethyl (B) <11  0 - 11 mg/dL  ACETAMINOPHEN LEVEL      Component Value Range   Acetaminophen (Tylenol), Serum <15.0  10 - 30 ug/mL  SALICYLATE LEVEL      Component Value Range   Salicylate Lvl 2.0 (*) 2.8 - 20.0 mg/dL  TROPONIN I      Component Value Range   Troponin I <0.30  <0.30 ng/mL  URINE MICROSCOPIC-ADD ON      Component Value Range   Squamous Epithelial / LPF RARE  RARE   WBC, UA 0-2  <3 WBC/hpf   RBC / HPF 0-2  <3 RBC/hpf   Bacteria, UA RARE  RARE   Crystals CA OXALATE CRYSTALS (*) NEGATIVE  Dg Chest 2 View  03/20/2012  *RADIOLOGY REPORT*  Clinical Data: Chest pain.  CHEST - 2 VIEW  Comparison: February 13, 2012.  Findings: Cardiomediastinal silhouette appears normal.  No acute pulmonary disease is noted.  Bony thorax is intact.  IMPRESSION: No acute cardiopulmonary abnormality seen.   Original Report Authenticated By: Lupita Raider.,  M.D.    Lab tests showed UDS + for cocaine and THC. Troponin I was negative.   K was low at 3.0.   Will order KCl 40 meq po. Medically cleared for psychiatric treatment.  1. Suicidal ideation   2.  Depression        Carleene Cooper III, MD 03/20/12 1028    Carleene Cooper III, MD 03/20/12 [phone]

## 2012-03-20 NOTE — Progress Notes (Signed)
4:05 PM Pt accepted for transfer to [facility] by Dr. Dub Mikes.

## 2012-03-20 NOTE — ED Notes (Signed)
ACT team member at bedside 

## 2012-03-20 NOTE — ED Notes (Signed)
To ED via ems for eval of cp. Started last pm. 8/10. MI last month with heart cath. Compares this pain to then. 324mg  ASA and 1 Nitro PTA.

## 2012-03-20 NOTE — Tx Team (Signed)
Initial Interdisciplinary Treatment Plan  PATIENT STRENGTHS: (choose at least two) Ability for insight Capable of independent living General fund of knowledge Motivation for treatment/growth  PATIENT STRESSORS: Anniversary of Loss    PROBLEM LIST: Problem List/Patient Goals Date to be addressed Date deferred Reason deferred Estimated date of resolution  Suicidal Ideations      Depression      Substance Abuse                                           DISCHARGE CRITERIA:  Need for constant or close observation no longer present Reduction of life-threatening or endangering symptoms to within safe limits  PRELIMINARY DISCHARGE PLAN: Attend aftercare/continuing care group Outpatient therapy  PATIENT/FAMIILY INVOLVEMENT: This treatment plan has been presented to and reviewed with the patient, [name], and/or family member.  The patient and family have been given the opportunity to ask questions and make suggestions.  Noah Charon 03/20/2012, 5:38 PM

## 2012-03-20 NOTE — BH Assessment (Signed)
Assessment Note   [name] is an 51 y.o. male that presented to [facility] after an intentional overdose.  Pt reports taking 100$ worth of crack mixed with muscle relaxers in a + suicide attempt a/w the anniversary of his Fiancee's death x1 year ago.  Pt reports "I just want to die and I thought I had it figured out how to do it."  According to pt, he was on the phone saying good-bye to his Mother when she called 911.  Pt reports hx of BiPolar with at least two previous admissions to [facility] and the [facility].  [facility] but went off his psychiatric medications x102mths ago "because they made me sick."  Pt states that he is not compliant with any of his medications and recently suffered an MI "but I am not taking meds."  Pt also has hx of Cannibus and Crack Dependence and reports using Crack last night, but no Cannibus in past three months.  Pt reports crying, increased appetite w/50 lb weight gain in past four months, hopelessness and helplessness and "I don't believe in God anymore."  Pt admits inability to contract for safety and positive plan to harm himself if released from the hospital.  Currently, the [facility] is full.  Information being sent to [facility] for review for inpatient care.  Axis I: Bipolar, Depressed and Substance Abuse Axis II: Cluster B Traits Axis III:  Past Medical History  Diagnosis Date  . Arthritis   . Hypertension   . Anxiety   . Bipolar affective disorder   . Depression   . Alcohol abuse   . Cocaine abuse     smokes crack-cocaine  . GERD (gastroesophageal reflux disease)   . Chronic back pain   . Tobacco abuse   . Hepatitis C   . Vitamin D deficiency   . Gout   . Hemorrhoids   . Diabetes mellitus without complication    Axis IV: other psychosocial or environmental problems, problems related to social environment and problems with primary support group Axis V: 31-40 impairment in reality testing  Past Medical  History:  Past Medical History  Diagnosis Date  . Arthritis   . Hypertension   . Anxiety   . Bipolar affective disorder   . Depression   . Alcohol abuse   . Cocaine abuse     smokes crack-cocaine  . GERD (gastroesophageal reflux disease)   . Chronic back pain   . Tobacco abuse   . Hepatitis C   . Vitamin D deficiency   . Gout   . Hemorrhoids   . Diabetes mellitus without complication     History reviewed. No pertinent past surgical history.  Family History:  Family History  Problem Relation Age of Onset  . Hypertension Brother   . Diabetes Brother   . Hypertension Mother   . Diabetes Mother   . Arthritis Mother   . Other Sister     bone disease    Social History:  reports that he quit smoking about 3 months ago. He does not have any smokeless tobacco history on file. He reports that he does not drink alcohol or use illicit drugs.  Additional Social History:  Alcohol / Drug Use Pain Medications: See MAR Prescriptions: See MAR Over the Counter: See MAR History of alcohol / drug use?: Yes Substance #1 Name of Substance 1: Cannibus 1 - Age of First Use: 9 1 - Amount (size/oz): several  blunts  1 - Frequency: QD until 3 mths ago 1 - Duration: years 1 - Last Use / Amount: over 3 months ago Substance #2 Name of Substance 2: Crack 2 - Age of First Use: 35 2 - Amount (size/oz): 100$ 2 - Frequency: QD 2 - Duration: years 2 - Last Use / Amount: last night "I haven't used in the last three months prior to last night"  CIWA: CIWA-Ar BP: 126/84 mmHg Pulse Rate: 90  Nausea and Vomiting: 2 Tactile Disturbances: none Tremor: no tremor Auditory Disturbances: not present Paroxysmal Sweats: no sweat visible Visual Disturbances: not present Anxiety: no anxiety, at ease Headache, Fullness in Head: none present Agitation: normal activity Orientation and Clouding of Sensorium: oriented and can do serial additions CIWA-Ar Total: 2  COWS:    Allergies: No Known  Allergies  Home Medications:  (Not in a hospital admission)  OB/GYN Status:  No LMP for male patient.  General Assessment Data Location of Assessment: [facility] ED Living Arrangements: Alone Can pt return to current living arrangement?: Yes Admission Status: Voluntary Is patient capable of signing voluntary admission?: Yes Transfer from: Acute Hospital Referral Source: Self/Family/Friend  Education Status Is patient currently in school?: No  Risk to self Suicidal Ideation: Yes-Currently Present Suicidal Intent: Yes-Currently Present Is patient at risk for suicide?: Yes Suicidal Plan?: Yes-Currently Present Specify Current Suicidal Plan: To overdose on meds and Crack Access to Means: Yes Specify Access to Suicidal Means: medications and drugs available What has been your use of drugs/alcohol within the last 12 months?: Used Crack last night and Cannibus daily until 3 months ago Previous Attempts/Gestures: Yes How many times?: 2  Other Self Harm Risks: reckless and impulsive Triggers for Past Attempts: Anniversary;Other personal contacts;Unpredictable Intentional Self Injurious Behavior: Damaging Family Suicide History: No Recent stressful life event(s): Conflict (Comment);Recent negative physical changes;Turmoil (Comment) (reports one year anniversary of Fiancee's death) Persecutory voices/beliefs?: No Depression: Yes Depression Symptoms: Despondent;Insomnia;Guilt;Loss of interest in usual pleasures;Feeling worthless/self pity;Feeling angry/irritable Substance abuse history and/or treatment for substance abuse?: Yes Suicide prevention information given to non-admitted patients: Not applicable  Risk to Others Homicidal Ideation: No Thoughts of Harm to Others: No Current Homicidal Intent: No Current Homicidal Plan: No Access to Homicidal Means: No Identified Victim: none per pt History of harm to others?: Yes Assessment of Violence: In distant past Violent Behavior  Description: "when my daughter died 10 yrs ago, I was violent" Does patient have access to weapons?: No Criminal Charges Pending?: No Does patient have a court date: No  Psychosis Hallucinations: None noted Delusions: None noted  Mental Status Report Appear/Hygiene: Other (Comment) (casual in scrubs) Eye Contact: Good Motor Activity: Unremarkable Speech: Logical/coherent Level of Consciousness: Alert Mood: Depressed;Apathetic;Helpless;Sullen Affect: Apathetic;Appropriate to circumstance;Depressed Anxiety Level: Moderate Thought Processes: Relevant Judgement: Impaired Orientation: Person;Place;Time;Situation Obsessive Compulsive Thoughts/Behaviors: Moderate  Cognitive Functioning Concentration: Normal Memory: Recent Intact;Remote Intact IQ: Average Insight: Poor Impulse Control: Poor Appetite: Good Weight Loss: 0  Weight Gain: 50  (50 lbs in 4 months) Sleep: Decreased Total Hours of Sleep:  (< 4hrs/QHS) Vegetative Symptoms: None     Abuse/Neglect [facility]) Physical Abuse: Denies Verbal Abuse: Denies Sexual Abuse: Denies  Prior Inpatient Therapy Prior Inpatient Therapy: Yes Prior Therapy Dates: 2012 and prior Prior Therapy Facilty/Provider(s): [facility] and [name] Reason for Treatment: substance abuse/BiPolar  Prior Outpatient Therapy Prior Outpatient Therapy: Yes Prior Therapy Dates: currently [facility] Reason for Treatment: medication management  ADL Screening (condition at time of admission) Weakness of Legs:  None Weakness of Arms/Hands: None       Abuse/Neglect Assessment (Assessment to be complete while patient is alone) Physical Abuse: Denies Verbal Abuse: Denies Sexual Abuse: Denies Exploitation of patient/patient's resources: Denies Self-Neglect: Denies Values / Beliefs Cultural Requests During Hospitalization: None Spiritual Requests During Hospitalization: None   Advance Directives (For Healthcare) Advance  Directive: Patient does not have advance directive    Additional Information 1:1 In Past 12 Months?: No CIRT Risk: No Elopement Risk: No Does patient have medical clearance?: Yes     Disposition: Please run for possible inpatient hospitalization.   Disposition Disposition of Patient: Inpatient treatment program;Referred to Type of inpatient treatment program: Adult  On Site Evaluation by:   Reviewed with Physician:     Angelica Ran 03/20/2012 1:02 PM

## 2012-03-20 NOTE — ED Notes (Signed)
Report to [facility] RN- patient going to radiology then to C[facility].  Ok to remove cardiac monitors per Dr. Earlene Plater.

## 2012-03-20 NOTE — ED Notes (Signed)
Pt provided [place] sandwich, sprite per EDP

## 2012-03-20 NOTE — ED Notes (Addendum)
Pt reporting this morning took 15 750 mg muscle relaxants. Denies any crack use today. Pt appearing sleepy, keeps eyes closed. Responds to questioning.

## 2012-03-21 ENCOUNTER — Encounter (HOSPITAL_COMMUNITY): Payer: Self-pay | Admitting: Psychiatry

## 2012-03-21 DIAGNOSIS — F1920 Other psychoactive substance dependence, uncomplicated: Secondary | ICD-10-CM | POA: Diagnosis present

## 2012-03-21 DIAGNOSIS — F10239 Alcohol dependence with withdrawal, unspecified: Secondary | ICD-10-CM | POA: Diagnosis present

## 2012-03-21 DIAGNOSIS — F3130 Bipolar disorder, current episode depressed, mild or moderate severity, unspecified: Secondary | ICD-10-CM

## 2012-03-21 DIAGNOSIS — F10939 Alcohol use, unspecified with withdrawal, unspecified: Secondary | ICD-10-CM | POA: Diagnosis present

## 2012-03-21 MED ORDER — THIAMINE HCL 100 MG/ML IJ SOLN
100.0000 mg | Freq: Once | INTRAMUSCULAR | Status: AC
  Administered 2012-03-21: 100 mg via INTRAMUSCULAR

## 2012-03-21 MED ORDER — ADULT MULTIVITAMIN W/MINERALS CH
1.0000 | ORAL_TABLET | Freq: Every day | ORAL | Status: DC
  Administered 2012-03-21 – 2012-03-28 (×8): 1 via ORAL
  Filled 2012-03-21 (×9): qty 1

## 2012-03-21 MED ORDER — HYDROXYZINE HCL 25 MG PO TABS: 25.0000 mg | ORAL_TABLET | Freq: Four times a day (QID) | ORAL | Status: AC | PRN

## 2012-03-21 MED ORDER — LOPERAMIDE HCL 2 MG PO CAPS: 2.0000 mg | ORAL_CAPSULE | ORAL | Status: AC | PRN

## 2012-03-21 MED ORDER — CHLORDIAZEPOXIDE HCL 25 MG PO CAPS
25.0000 mg | ORAL_CAPSULE | Freq: Four times a day (QID) | ORAL | Status: AC
  Administered 2012-03-21 (×3): 25 mg via ORAL
  Filled 2012-03-21 (×2): qty 1

## 2012-03-21 MED ORDER — CHLORDIAZEPOXIDE HCL 25 MG PO CAPS
50.0000 mg | ORAL_CAPSULE | Freq: Once | ORAL | Status: AC
  Administered 2012-03-21: 50 mg via ORAL

## 2012-03-21 MED ORDER — CHLORDIAZEPOXIDE HCL 25 MG PO CAPS
25.0000 mg | ORAL_CAPSULE | Freq: Every day | ORAL | Status: AC
  Administered 2012-03-24: 25 mg via ORAL
  Filled 2012-03-21: qty 1

## 2012-03-21 MED ORDER — CHLORDIAZEPOXIDE HCL 25 MG PO CAPS
25.0000 mg | ORAL_CAPSULE | Freq: Four times a day (QID) | ORAL | Status: AC | PRN
  Filled 2012-03-21: qty 3
  Filled 2012-03-21: qty 1

## 2012-03-21 MED ORDER — VITAMIN B-1 100 MG PO TABS
100.0000 mg | ORAL_TABLET | Freq: Every day | ORAL | Status: DC
  Administered 2012-03-22 – 2012-03-28 (×7): 100 mg via ORAL
  Filled 2012-03-21 (×8): qty 1

## 2012-03-21 MED ORDER — ONDANSETRON 4 MG PO TBDP
4.0000 mg | ORAL_TABLET | Freq: Four times a day (QID) | ORAL | Status: AC | PRN
  Administered 2012-03-21: 4 mg via ORAL

## 2012-03-21 MED ORDER — CHLORDIAZEPOXIDE HCL 25 MG PO CAPS
25.0000 mg | ORAL_CAPSULE | ORAL | Status: AC
  Administered 2012-03-23 (×2): 25 mg via ORAL
  Filled 2012-03-21 (×2): qty 1

## 2012-03-21 MED ORDER — CHLORDIAZEPOXIDE HCL 25 MG PO CAPS
25.0000 mg | ORAL_CAPSULE | Freq: Three times a day (TID) | ORAL | Status: AC
  Administered 2012-03-22 (×3): 25 mg via ORAL
  Filled 2012-03-21 (×3): qty 1

## 2012-03-21 NOTE — Progress Notes (Signed)
[facility] LCSW Group Therapy  03/21/2012 2:29 PM  Type of Therapy:  Group Therapy  Participation Level:  Minimal  Participation Quality:  Attentive  Affect:  Depressed  Cognitive:  Alert  Insight:  Lacking  Engagement in Therapy:  Engaged  Modes of Intervention:  Discussion, Exploration, Problem-solving and Support  Summary of Progress/Problems: The topic for group was balance in life.  Pt participated in the discussion about when their life was in balance and out of balance and how this feels.  Patient was very depressed during group, walked out several times due to illness.  Discussed be unsure what balance looks like anymore and how he can obtain it.  Discussed multiple losses in his life and having difficulty with the grieving process.  [name] [place] 03/21/2012, 2:29 PM

## 2012-03-21 NOTE — H&P (Signed)
Psychiatric Admission Assessment Adult  Patient Identification:  [name] Date of Evaluation:  03/21/2012 Chief Complaint:  BIPOLAR History of Present Illness::Tried to kill himself yesterday. " A variety of things, doc"  Living on the streets again, anniversary of girlfriend's death, not getting along with the family. Staying in motels and hotels, Last time he had a stable place 3 years ago after she died he lost what she contributed.  Tried to OD, called his mother telling her that he was not going to be there for her birthday or Christmas, mom called EMS. Drinking 2 (40 oz) two bottles of wine, a pint of liquor a day for a year. Did crack a day ago when he tried to OD, 10-12 muscle relaxers, hydrocodones. Elements:  Location:  In patient. Quality:  Depressed, crying isolated all the time. Severity:  severe. Timing:  all day. Duration:  last 6 months. Context:  thinking about killing himself, dependent on alcohol. Associated Signs/Synptoms: Depression Symptoms:  depressed mood, anhedonia, insomnia, psychomotor retardation, fatigue, feelings of worthlessness/guilt, difficulty concentrating, hopelessness, recurrent thoughts of death, suicidal thoughts with specific plan, suicidal attempt, anxiety, panic attacks, insomnia, loss of energy/fatigue, disturbed sleep, decreased appetite, (Hypo) Manic Symptoms:  Hallucinations, Impulsivity, Irritable Mood, Labiality of Mood, Anxiety Symptoms:  Excessive Worry, Panic Symptoms, Psychotic Symptoms:  Hallucinations: Visual PTSD Symptoms: Had a traumatic exposure:  girlfriend died in front of him  Psychiatric Specialty Exam: Physical Exam  ROS  Blood pressure 136/99, pulse 87, temperature 98.4 F (36.9 C), temperature source Oral, resp. rate 18, height 5\' 7"  (1.702 m), weight 113.399 kg (250 lb).Body mass index is 39.16 kg/(m^2).  General Appearance: Disheveled  Eye Contact::  Minimal  Speech:  Clear and Coherent and not  spontaneous  Volume:  Normal  Mood:  Anxious, Depressed, Dysphoric, Hopeless, Irritable and Worthless  Affect:  Depressed and anxious, restless  Thought Process:  Coherent and Goal Directed  Orientation:  Full (Time, Place, and Person)  Thought Content:  Rumination  Suicidal Thoughts:  Yes.  with intent/plan  Homicidal Thoughts:  No  Memory:  Immediate;   Fair Recent;   Fair Remote;   Fair  Judgement:  Fair  Insight:  Present  Psychomotor Activity:  Restlessness  Concentration:  Fair  Recall:  Fair  Akathisia:  No  Handed:  Right  AIMS (if indicated):     Assets:  Desire for Improvement  Sleep:  Number of Hours: 6.25     Past Psychiatric History: Diagnosis:Bipolar Disorder, depressed, polysubstance dependence  Hospitalizations: [facility] several times, [facility]  Outpatient Care: [facility] last time a year ago  Substance Abuse Care: [facility] [place]  Self-Mutilation: Denies  Suicidal Attempts: Yes  Violent Behaviors: Yes   Past Medical History:   Past Medical History  Diagnosis Date  . Arthritis   . Hypertension   . Anxiety   . Bipolar affective disorder   . Depression   . Alcohol abuse   . Cocaine abuse     smokes crack-cocaine  . GERD (gastroesophageal reflux disease)   . Chronic back pain   . Tobacco abuse   . Hepatitis C   . Vitamin D deficiency   . Gout   . Hemorrhoids   . Diabetes mellitus without complication     Allergies:  No Known Allergies PTA Medications: Prescriptions prior to admission  Medication Sig Dispense Refill  . amLODipine (NORVASC) 10 MG tablet Take 10 mg by mouth daily.      Marland Kitchen lisinopril (  PRINIVIL,ZESTRIL) 40 MG tablet Take 40 mg by mouth daily.        Previous Psychotropic Medications:  Medication/Dose  Cant remember               Substance Abuse History in the last 12 months:  yes  Consequences of Substance Abuse: Blackouts:   Withdrawal Symptoms:    Diaphoresis Diarrhea Headaches Nausea Tremors Vomiting  Social History:  reports that he quit smoking about 3 months ago. He does not have any smokeless tobacco history on file. He reports that he does not drink alcohol or use illicit drugs. Additional Social History:                      Current Place of Residence:   Place of Birth:   Family Members: Marital Status:  Divorced Children:  Sons:              Daughters: 3 M/O died SIDS  Relationships: Education:  Corporate treasurer Problems/Performance: Religious Beliefs/Practices: History of Abuse (Emotional/Phsycial/Sexual) Armed forces technical officer; Holiday representative. Landscaping. Warehouse, Hotel manager History:  Wellsite geologist History: Hobbies/Interests:  Family History:   Family History  Problem Relation Age of Onset  . Hypertension Brother   . Diabetes Brother   . Hypertension Mother   . Diabetes Mother   . Arthritis Mother   . Other Sister     bone disease    Results for orders placed during the hospital encounter of 03/20/12 (from the past 72 hour(s))  URINE RAPID DRUG SCREEN (HOSP PERFORMED)     Status: Abnormal   Collection Time   03/20/12  9:55 AM      Component Value Range Comment   Opiates NONE DETECTED  NONE DETECTED    Cocaine POSITIVE (*) NONE DETECTED    Benzodiazepines NONE DETECTED  NONE DETECTED    Amphetamines NONE DETECTED  NONE DETECTED    Tetrahydrocannabinol POSITIVE (*) NONE DETECTED    Barbiturates NONE DETECTED  NONE DETECTED   URINALYSIS, ROUTINE W REFLEX MICROSCOPIC     Status: Abnormal   Collection Time   03/20/12  9:55 AM      Component Value Range Comment   Color, Urine YELLOW  YELLOW    APPearance CLEAR  CLEAR    Specific Gravity, Urine 1.029  1.005 - 1.030    pH 6.5  5.0 - 8.0    Glucose, UA NEGATIVE  NEGATIVE mg/dL    Hgb urine dipstick NEGATIVE  NEGATIVE    Bilirubin Urine NEGATIVE  NEGATIVE    Ketones, ur NEGATIVE  NEGATIVE mg/dL    Protein, ur NEGATIVE  NEGATIVE mg/dL     Urobilinogen, UA 0.2  0.0 - 1.0 mg/dL    Nitrite NEGATIVE  NEGATIVE    Leukocytes, UA TRACE (*) NEGATIVE   URINE MICROSCOPIC-ADD ON     Status: Abnormal   Collection Time   03/20/12  9:55 AM      Component Value Range Comment   Squamous Epithelial / LPF RARE  RARE    WBC, UA 0-2  <3 WBC/hpf    RBC / HPF 0-2  <3 RBC/hpf    Bacteria, UA RARE  RARE    Crystals CA OXALATE CRYSTALS (*) NEGATIVE   CBC WITH DIFFERENTIAL     Status: Normal   Collection Time   03/20/12 10:37 AM      Component Value Range Comment   WBC 8.5  4.0 - 10.5 K/uL    RBC 5.08  4.22 - 5.81 MIL/uL  Hemoglobin 14.2  13.0 - 17.0 g/dL    HCT 16.1  09.6 - 04.5 %    MCV 80.3  78.0 - 100.0 fL    MCH 28.0  26.0 - 34.0 pg    MCHC 34.8  30.0 - 36.0 g/dL    RDW 40.9  81.1 - 91.4 %    Platelets 248  150 - 400 K/uL    Neutrophils Relative 63  43 - 77 %    Neutro Abs 5.4  1.7 - 7.7 K/uL    Lymphocytes Relative 28  12 - 46 %    Lymphs Abs 2.4  0.7 - 4.0 K/uL    Monocytes Relative 7  3 - 12 %    Monocytes Absolute 0.6  0.1 - 1.0 K/uL    Eosinophils Relative 2  0 - 5 %    Eosinophils Absolute 0.2  0.0 - 0.7 K/uL    Basophils Relative 0  0 - 1 %    Basophils Absolute 0.0  0.0 - 0.1 K/uL   COMPREHENSIVE METABOLIC PANEL     Status: Abnormal   Collection Time   03/20/12 10:37 AM      Component Value Range Comment   Sodium 141  135 - 145 mEq/L    Potassium 3.0 (*) 3.5 - 5.1 mEq/L    Chloride 106  96 - 112 mEq/L    CO2 22  19 - 32 mEq/L    Glucose, Bld 122 (*) 70 - 99 mg/dL    BUN 8  6 - 23 mg/dL    Creatinine, Ser 7.82  0.50 - 1.35 mg/dL    Calcium 9.3  8.4 - 95.6 mg/dL    Total Protein 7.5  6.0 - 8.3 g/dL    Albumin 4.0  3.5 - 5.2 g/dL    AST 75 (*) 0 - 37 U/L    ALT 158 (*) 0 - 53 U/L    Alkaline Phosphatase 54  39 - 117 U/L    Total Bilirubin 0.4  0.3 - 1.2 mg/dL    GFR calc non Af Amer 82 (*) >90 mL/min    GFR calc Af Amer >90  >90 mL/min   ETHANOL     Status: Normal   Collection Time   03/20/12 10:37 AM       Component Value Range Comment   Alcohol, Ethyl (B) <11  0 - 11 mg/dL   ACETAMINOPHEN LEVEL     Status: Normal   Collection Time   03/20/12 10:37 AM      Component Value Range Comment   Acetaminophen (Tylenol), Serum <15.0  10 - 30 ug/mL   SALICYLATE LEVEL     Status: Abnormal   Collection Time   03/20/12 10:37 AM      Component Value Range Comment   Salicylate Lvl 2.0 (*) 2.8 - 20.0 mg/dL   TROPONIN I     Status: Normal   Collection Time   03/20/12 10:42 AM      Component Value Range Comment   Troponin I <0.30  <0.30 ng/mL    Psychological Evaluations:  Assessment:   AXIS I:  Bipolar, Depressed, Polysubstance Dependence AXIS II:  Deferred AXIS III:   Past Medical History  Diagnosis Date  . Arthritis   . Hypertension   . Anxiety   . Bipolar affective disorder   . Depression   . Alcohol abuse   . Cocaine abuse     smokes crack-cocaine  . GERD (gastroesophageal reflux disease)   .  Chronic back pain   . Tobacco abuse   . Hepatitis C   . Vitamin D deficiency   . Gout   . Hemorrhoids   . Diabetes mellitus without complication    AXIS IV:  housing problems and problems with primary support group AXIS V:  41-50 serious symptoms  Treatment Plan/Recommendations:  Detox and reassess co morbidities  Treatment Plan Summary: Daily contact with patient to assess and evaluate symptoms and progress in treatment Medication management Current Medications:  Current Facility-Administered Medications  Medication Dose Route Frequency Provider Last Rate Last Dose  . acetaminophen (TYLENOL) tablet 650 mg  650 mg Oral Q6H PRN Cleotis Nipper, MD      . alum & mag hydroxide-simeth (MAALOX/MYLANTA) 200-200-20 MG/5ML suspension 30 mL  30 mL Oral Q4H PRN Cleotis Nipper, MD      . amLODipine (NORVASC) tablet 10 mg  10 mg Oral Daily Cleotis Nipper, MD      . lisinopril (PRINIVIL,ZESTRIL) tablet 40 mg  40 mg Oral Daily Cleotis Nipper, MD      . magnesium hydroxide (MILK OF MAGNESIA) suspension  30 mL  30 mL Oral Daily PRN Cleotis Nipper, MD      . traZODone (DESYREL) tablet 50 mg  50 mg Oral QHS PRN,MR X 1 Cleotis Nipper, MD       Facility-Administered Medications Ordered in Other Encounters  Medication Dose Route Frequency Provider Last Rate Last Dose  . [COMPLETED] potassium chloride SA (K-DUR,KLOR-CON) CR tablet 40 mEq  40 mEq Oral Once Carleene Cooper III, MD   40 mEq at 03/20/12 1406  . [DISCONTINUED] amLODipine (NORVASC) tablet 10 mg  10 mg Oral Daily Carleene Cooper III, MD      . [DISCONTINUED] lisinopril (PRINIVIL,ZESTRIL) tablet 40 mg  40 mg Oral Daily Carleene Cooper III, MD        Observation Level/Precautions:  15 minute checks  Laboratory:  As per ED  Psychotherapy:  Individual/group  Medications:  [facility] detox  Consultations:    Discharge Concerns:    Estimated LOS:  Other:     I certify that inpatient services furnished can reasonably be expected to improve the patient's condition.   Jamerius Boeckman A 12/12/20138:48 AM

## 2012-03-21 NOTE — [facility] Suicide Risk Assessment (Signed)
Suicide Risk Assessment  Admission Assessment     Nursing information obtained from:  Patient Demographic factors:  Male;Living alone;Unemployed Current Mental Status:  Suicidal ideation indicated by patient;Plan includes specific time, place, or method;Self-harm thoughts;Belief that plan would result in death Loss Factors:  Loss of significant relationship Historical Factors:  Prior suicide attempts;Anniversary of important loss Risk Reduction Factors:  Positive therapeutic relationship;Positive coping skills or problem solving skills  CLINICAL FACTORS:   Severe Anxiety and/or Agitation Bipolar Disorder:   Depressive phase Alcohol/Substance Abuse/Dependencies  COGNITIVE FEATURES THAT CONTRIBUTE TO RISK:  Thought constriction (tunnel vision)    SUICIDE RISK:   Severe:  Frequent, intense, and enduring suicidal ideation, specific plan, no subjective intent, but some objective markers of intent (i.e., choice of lethal method), the method is accessible, some limited preparatory behavior, evidence of impaired self-control, severe dysphoria/symptomatology, multiple risk factors present, and few if any protective factors, particularly a lack of social support.  PLAN OF CARE: [facility] detox, reassess co morbidities, relapse prevention   [name] A 03/21/2012, 9:14 AM

## 2012-03-21 NOTE — Progress Notes (Signed)
Patient ID: [name], male   DOB: 1960/11/18, 51 y.o.   MRN: [id number]  D:  Initially pt was agitated that the writer asked to speak to him. However, after several mins of conversation, pt began to open up and confide in Retail banker. Pt and Clinical research associate discussed other options rather than suicide. Was advised of free counseling services that may be available. Informed pt to check with his treatment for more info.   A: Encouragement and support offered. 15 min checks continued for safety.  R: Pt remains safe.

## 2012-03-21 NOTE — Progress Notes (Signed)
  D) Patient quiet but cooperative upon my assessment. Patient refused to completed Patient Self Inventory. Patient endorses passive SI, verbally contracts with RN for safety. Patient denies HI, denies A/V hallucinations.   A) Patient offered support and encouragement, patient encouraged to discuss feelings/concerns with staff. Patient verbalized understanding. Patient monitored Q15 minutes for safety. Patient met with MD  to discuss today's goals and plan of care.  R) Patient visible in milieu, attending groups in day room and meals in dining room. Patient appropriate with staff and peers.   Patient taking medications as ordered. Will continue to monitor. Patient is willing to dispose of insulin in locker before discharge, per Chrystal SW.

## 2012-03-21 NOTE — Clinical Social Work Note (Signed)
[facility] LCSW Aftercare Discharge Planning Group Note  03/21/2012 8:45 AM  Participation Quality:  Did Not Attend  Reyes Ivan, LCSWA 03/21/2012 9:39 AM

## 2012-03-21 NOTE — Progress Notes (Signed)
Patient did attend the evening karaoke group.  

## 2012-03-22 DIAGNOSIS — F3160 Bipolar disorder, current episode mixed, unspecified: Secondary | ICD-10-CM

## 2012-03-22 MED ORDER — CARBAMAZEPINE ER 200 MG PO TB12
200.0000 mg | ORAL_TABLET | Freq: Two times a day (BID) | ORAL | Status: DC
  Administered 2012-03-22 – 2012-03-28 (×12): 200 mg via ORAL
  Filled 2012-03-22 (×16): qty 1

## 2012-03-22 MED ORDER — MELOXICAM 7.5 MG PO TABS
7.5000 mg | ORAL_TABLET | Freq: Two times a day (BID) | ORAL | Status: DC
  Administered 2012-03-22 – 2012-03-28 (×12): 7.5 mg via ORAL
  Filled 2012-03-22 (×16): qty 1

## 2012-03-22 NOTE — Progress Notes (Signed)
Patient ID: [name], male   DOB: [birth year], 51 y.o.   MRN: [id number]  D: Patient pleasant on approach tonight. Reports withdrawal symptoms are better and mood better tonight. Reports a headache and some stomach upset and got some medications for those issues. Some passive SI earlier today but not presently. A: Staff will monitor on q 15 minute checks, follow treatment plan and give medications as prescribed. R: Took medications and went to dayroom interacting with other peers.
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
## 2012-03-22 NOTE — Progress Notes (Signed)
[facility] Group Notes:  (Counselor/Nursing/MHT/Case Management/Adjunct)  03/22/2012 3:06 PM  Type of Therapy:  Psychoeducational Skills  Participation Level:  None  Participation Quality:  Inattentive and Resistant  Affect:  Angry and Resistant  Cognitive:  Alert  Engagement in Group:  None  Modes of Intervention:  Activity and Socialization  Summary of Progress/Problems:  Pt attended therapeutic activity but did not participate in a game called "Buzz word" with the other pts. Pt was not engaged in group, pt appeared frustrated when walking into group.    [name] 03/22/2012, 3:06 PM
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
## 2012-03-22 NOTE — Progress Notes (Signed)
D.  Pt pleasant and bright on approach, playing cards with peers.  Interacting appropriately within milieu.  Denies complaints at this time.  Did ask what medication he has available for sleep tonight and was pleased that he does have something available.  Positive for evening AA group with appropriate participation.  Denies SI/HI/hallucinations at this time.  A.  Support and encouragement offered R.  Will continue to monitor.

## 2012-03-22 NOTE — Progress Notes (Signed)
[facility] LCSW Aftercare Discharge Planning Group Note  03/22/2012 12:23 PM  Participation Quality:  Attentive  Affect:  Angry and Irritable  Cognitive:  Alert  Insight:  Limited  Engagement in Group:  Limited  Modes of Intervention:  Discussion, Exploration and Problem-solving  Summary of Progress/Problems: Patient highly agitated, currently rating his depression and anxiety at a 9.  Reports continued thoughts of suicide. States that he never wanted to come to the hospital and wants to talk 1:1 to this CSW to discuss his plans. Verna Czech [place] 03/22/2012, 12:23 PM

## 2012-03-22 NOTE — Progress Notes (Signed)
Psychoeducational Group Note  Date:  03/22/2012 Time:  2000  Group Topic/Focus:  AA group  Participation Level:  Active  Participation Quality:  Appropriate  Affect:  Appropriate  Cognitive:  Alert  Insight:  Improving  Engagement in Group:  Engaged  Additional Comments:    [name] R 03/22/2012, 10:03 PM

## 2012-03-22 NOTE — Progress Notes (Signed)
[facility] LCSW Group Therapy  03/22/2012 3:29 PM  Type of Therapy:  Group Therapy  Participation Level:  Active  Participation Quality:  Attentive  Affect:  Anxious, Defensive and Irritable  Cognitive:  Alert  Insight:  Developing/Improving  Engagement in Therapy:  Developing/Improving  Modes of Intervention:  Discussion, Exploration, Problem-solving and Support  Summary of Progress/Problems: The topic for today was feelings about relapse.  Pt discussed what relapse prevention is to them and identified triggers that they are on the path to relapse.  Pt processed their feeling towards relapse and was able to relate to peers.  Pt easily agitated, had difficulty discussing coping skills that can be used for relapse prevention. However was able to admit that his mother was a motivator for staying alive and clean.    Verna Czech [place] 03/22/2012, 3:29 PM

## 2012-03-22 NOTE — Progress Notes (Signed)
  D) Patient irritable but cooperative upon my assessment. Patient has been observed angrily arguing with motel where his belongings were left, patient was angry because "since I didn't die I want my belongings." Patient completed Patient Self Inventory, reports slept "poor," and  appetite is "improving." Patient rates depression as  9 /10, patient rates hopeless feelings as 9/10. Patient endorses passive SI, contracts verbally for safety with RN. Patient has a plan to "use a better way of killing myself" once I am discharged . Patient offered emotional support, verbalizes understanding. Treatment team made aware of patient statements. Patient denies HI, denies A/V hallucinations.   A) Patient offered support and encouragement, patient encouraged to discuss feelings/concerns with staff. Patient verbalized understanding. Patient monitored Q15 minutes for safety. Patient met with MD  to discuss today's goals and plan of care.  R) Patient visible in milieu, attending groups in day room and meals in dining room. Patient appropriate with staff and peers.   Patient taking medications as ordered. Will continue to monitor.

## 2012-03-22 NOTE — Tx Team (Signed)
Interdisciplinary Treatment Plan Update (Adult)  Date:  03/22/2012  Time Reviewed:  4:06 PM   Progress in Treatment: Attending groups: Yes Participating in groups:  Yes Taking medication as prescribed: Yes Tolerating medication:  Yes Family/Significant othe contact made:  To be assessed by CSW Patient understands diagnosis:  Yes Discussing patient identified problems/goals with staff:  Yes Medical problems stabilized or resolved:  Yes Denies suicidal/homicidal ideation: Yes Issues/concerns per patient self-inventory:  None identified Other: N/A  New problem(s) identified: None Identified  Reason for Continuation of Hospitalization: Anxiety Depression Medication stabilization Suicidal ideation  Interventions implemented related to continuation of hospitalization: mood stabilization, medication monitoring and adjustment, group therapy and psycho education, suicide risk assessment, collateral contact, aftercare planning, ongoing physician assessments and safety checks q 15 mins  Additional comments: N/A  Estimated length of stay: 3-5 days  Discharge Plan: CSW is assessing for appropriate referrals.    New goal(s): N/A  Review of initial/current patient goals per problem list:    1.  Goal(s): Address substance use by completing detox protocol  Met:  No  Target date: 03/25/12  As evidenced by: completion of [facility]  2.  Goal (s): Reduce depressive symptoms from a 10 to a 3  Met:  No  Target date: 3-5 days  As evidenced by: Pt rates at a 9  3.  Goal (s): Reduce anxiety symptoms from a 10 to a 3  Met:  No  Target date:  3-5 days  As evidenced by: Pt rates at a  9  4.  Goal(s):  Met:  No  Target date:   As evidenced by:   Attendees: Patient:     Family:     Physician: Geoffery Lyons, MD 03/22/2012 4:06 PM   Nursing: Alease Frame, RN 03/22/2012 4:06 PM   Clinical Social Worker:  Reyes Ivan, LCSWA 03/22/2012  4:06 PM   Other: Berneice Heinrich, RN  03/22/2012  4:06 PM   Other:  Nanine Means, NP   Other:  Verna Czech, LCSW   Other:     Other:      Scribe for Treatment Team:   Reyes Ivan 03/22/2012 4:06 PM

## 2012-03-23 DIAGNOSIS — F1020 Alcohol dependence, uncomplicated: Secondary | ICD-10-CM

## 2012-03-23 DIAGNOSIS — F10239 Alcohol dependence with withdrawal, unspecified: Secondary | ICD-10-CM
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
## 2012-03-23 NOTE — Progress Notes (Signed)
D. Pt pleasant and appropriate on approach.  Denies complaints.  Asked what medication he had scheduled tonight, no other concerns voiced.  Denies SI/HI/hallucinations at this time.  Positive for evening group with appropriate participation.  Interacting appropriately within milieu.  A.  Support and encouragement offered, went over scheduled medication with Pt.  R.  Will continue to monitor.

## 2012-03-23 NOTE — Progress Notes (Signed)
Psychoeducational Group Note  Date:  03/23/2012 Time:  1315  Group Topic/Focus:  Coping With Mental Health Crisis:   The purpose of this group is to help patients identify strategies for coping with mental health crisis.  Group discusses possible causes of crisis and ways to manage them effectively.  Participation Level:  Active  Participation Quality:  Attentive  Affect:  Appropriate  Cognitive:  Appropriate  Insight:  Engaged  Engagement in Group:  Engaged  Additional Comments:  Group Performed by Luanne, RN   [name] 03/23/2012, 2:50 PM 
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
## 2012-03-23 NOTE — Clinical Social Work Note (Signed)
[facility] Group Notes:  (Clinical Social Work)  03/23/2012  10:00-11:00AM  Summary of Progress/Problems:   The main focus of today's process group was for the patient to identify ways in which they have in the past sabotaged their own recovery and reasons they may have done this/what they received from doing it.  We then worked to identify a specific plan to avoid doing this when discharged from the hospital for this admission.  The patient expressed that he tells himself "this will help with your pain, and if you drink enough you can die too" and that is what leads to drinking. He states that he has isolated since his daughter died 1-1/2 years ago, then his fiancee died 1 year ago, because he is in so much pain he does not want to get close to anybody or be that hurt again.  He talked in some detail about his suicidal thoughts and intentions.  When he repeated his "self-sabotaging phrase" out loud in order to pair it with a more helpful, healthy phrase, he kept saying "Die, die, death" over and over.   Type of Therapy:  Group Therapy - Process  Participation Level:  Active  Participation Quality:  Attentive and Sharing  Affect:  Depressed and Flat  Cognitive:  Appropriate and Oriented  Insight:  Engaged  Engagement in Therapy:  Engaged  Modes of Intervention:  Clarification, Education, Limit-setting, Problem-solving, Socialization, Support and Processing, Exploration, Discussion   Ambrose Mantle, LCSW 03/23/2012, 12:05 PM

## 2012-03-24 DIAGNOSIS — F329 Major depressive disorder, single episode, unspecified: Secondary | ICD-10-CM

## 2012-03-24 NOTE — Progress Notes (Signed)
.  [facility] Group Notes:  (Counselor/Nursing/MHT/Case Management/Adjunct)  03/23/2012 2100   Type of Therapy:  wrap up group  Participation Level:  Active  Participation Quality:  Appropriate, Attentive, Sharing and Supportive  Affect:  Appropriate  Cognitive:  Alert and Appropriate  Insight:  Engaged  Engagement in Group:  Engaged  Engagement in Therapy:  unknown to this Lawyer of Intervention:  Clarification, Armed forces technical officer of Progress/Problems:Pt reported having a "great" day. Pt experienced positive connections with his peers and reported " lots of laughter".     Shelah Lewandowsky 03/24/2012, 1:44 AM

## 2012-03-24 NOTE — Progress Notes (Signed)
D.  Pt bright and pleasant on approach, did report some anxiety, but otherwise no complaints.  Was playing cards with peers on approach.  Interacting appropriately within milieu.  Denies SI/HI/hallucinations at this time.  Positive for evening AA group with appropriate participation.  A.  Support and encouragement offered R.  Will continue to monitor, no acute distress noted.

## 2012-03-24 NOTE — Progress Notes (Signed)
[facility] MD Progress Note  03/24/2012 11:17 AM [name]  MRN:  [id number] Subjective:  7.5/10 depression, passive suicidal ideations Diagnosis:   Axis I: Alcohol Abuse, Depressive Disorder NOS and Substance Abuse Axis II: Deferred Axis III:  Past Medical History  Diagnosis Date  . Arthritis   . Hypertension   . Anxiety   . Bipolar affective disorder   . Depression   . Alcohol abuse   . Cocaine abuse     smokes crack-cocaine  . GERD (gastroesophageal reflux disease)   . Chronic back pain   . Tobacco abuse   . Hepatitis C   . Vitamin D deficiency   . Gout   . Hemorrhoids   . Diabetes mellitus without complication    Axis IV: economic problems, housing problems, occupational problems, other psychosocial or environmental problems, problems related to social environment and problems with primary support group Axis V: 41-50 serious symptoms  ADL's:  Intact  Sleep: Fair  Appetite:  Good  Suicidal Ideation:  Plan:  No Intent:  No Means:  None Homicidal Ideation:  Denies Psychiatric Specialty Exam: Review of Systems  Constitutional: Negative.   HENT: Negative.   Eyes: Negative.   Respiratory: Negative.   Cardiovascular: Negative.   Gastrointestinal: Negative.   Genitourinary: Negative.   Skin: Negative.   Neurological: Negative.   Endo/Heme/Allergies: Negative.   Psychiatric/Behavioral: Positive for depression and suicidal ideas.    Blood pressure 126/94, pulse 105, temperature 97.8 F (36.6 C), temperature source Oral, resp. rate 20, height 5\' 7"  (1.702 m), weight 113.399 kg (250 lb).Body mass index is 39.16 kg/(m^2).  General Appearance: Casual  Eye Contact::  Fair  Speech:  Normal Rate  Volume:  Normal  Mood:  Depressed and Irritable  Affect:  Flat  Thought Process:  Intact  Orientation:  Full (Time, Place, and Person)  Thought Content:  WDL  Suicidal Thoughts:  Yes.  without intent/plan  Homicidal Thoughts:  No  Memory:  Immediate;   Fair Recent;    Fair Remote;   Fair  Judgement:  Fair  Insight:  Fair  Psychomotor Activity:  Decreased  Concentration:  Fair  Recall:  Fair  Akathisia:  No  Handed:  Right  AIMS (if indicated):     Assets:  Communication Skills Desire for Improvement Social Support  Sleep:  Number of Hours: 5.5    Current Medications: Current Facility-Administered Medications  Medication Dose Route Frequency Provider Last Rate Last Dose  . acetaminophen (TYLENOL) tablet 650 mg  650 mg Oral Q6H PRN Cleotis Nipper, MD   650 mg at 03/21/12 2158  . alum & mag hydroxide-simeth (MAALOX/MYLANTA) 200-200-20 MG/5ML suspension 30 mL  30 mL Oral Q4H PRN Cleotis Nipper, MD   30 mL at 03/21/12 2158  . amLODipine (NORVASC) tablet 10 mg  10 mg Oral Daily Cleotis Nipper, MD   10 mg at 03/24/12 0836  . carbamazepine (TEGRETOL XR) 12 hr tablet 200 mg  200 mg Oral BID Rachael Fee, MD   200 mg at 03/24/12 0836  . lisinopril (PRINIVIL,ZESTRIL) tablet 40 mg  40 mg Oral Daily Cleotis Nipper, MD   40 mg at 03/24/12 0837  . magnesium hydroxide (MILK OF MAGNESIA) suspension 30 mL  30 mL Oral Daily PRN Cleotis Nipper, MD      . meloxicam [facility]) tablet 7.5 mg  7.5 mg Oral BID Rachael Fee, MD   7.5 mg at 03/24/12 0836  . multivitamin with minerals tablet 1  tablet  1 tablet Oral Daily Rachael Fee, MD   1 tablet at 03/24/12 [phone]  . thiamine (VITAMIN B-1) tablet 100 mg  100 mg Oral Daily Rachael Fee, MD   100 mg at 03/24/12 0836  . traZODone (DESYREL) tablet 50 mg  50 mg Oral QHS PRN,MR X 1 Cleotis Nipper, MD        Lab Results: No results found for this or any previous visit (from the past 48 hour(s)).  Physical Findings: AIMS: Facial and Oral Movements Muscles of Facial Expression: None, normal Lips and Perioral Area: None, normal Jaw: None, normal Tongue: None, normal,Extremity Movements Upper (arms, wrists, hands, fingers): None, normal Lower (legs, knees, ankles, toes): None, normal, Trunk Movements Neck, shoulders, hips: None,  normal, Overall Severity Severity of abnormal movements (highest score from questions above): None, normal Incapacitation due to abnormal movements: None, normal Patient's awareness of abnormal movements (rate only patient's report): No Awareness, Dental Status Current problems with teeth and/or dentures?: No Does patient usually wear dentures?: No  CIWA:  CIWA-Ar Total: 0  COWS:     Treatment Plan Summary: Daily contact with patient to assess and evaluate symptoms and progress in treatment Medication management  Plan:  Review of chart, notes, vital signs, and medications. 1-Individual and group therapy 2-Medication management 3-Supportive environment to optimize rehab 4-Treatment plan development to prevent relapse 5-Coping skill development for depression and anger issues  Medical Decision Making Problem Points:  Established problem, stable/improving (1) and Review of psycho-social stressors (1) Data Points:  Review of medication regiment & side effects (2)  I certify that inpatient services furnished can reasonably be expected to improve the patient's condition.   Nanine Means, PMH-NP 03/24/2012, 11:17 AM

## 2012-03-24 NOTE — Progress Notes (Signed)
Patient did attend the evening speaker AA meeting.  

## 2012-03-24 NOTE — Clinical Social Work Note (Signed)
[facility] Group Notes:  (Clinical Social Work)  03/24/2012  10:00-11:00AM  Summary of Progress/Problems:   The main focus of today's process group was for the patient to define "support" and describe what healthy supports are, then to identify the patient's current support system and decide on other supports that can be put in place to prevent future hospitalizations.   An emphasis was placed on using therapist, doctor and problem-specific support groups to expand supports.  The patient expressed that his mother in [place], [place] and the people in the hospital are his sole supports.  He does not want to move back to [place] because he fears letting his mother see how scared he is to lose her like he lost his child and fiancee.  We discussed how to add supports, and how to take on that responsibility, plus the consequences of not doing so.  He expressed that he may not be ready to do that yet, but is considering.  Type of Therapy:  Process Group  Participation Level:  Active  Participation Quality:  Drowsy and Sharing  Affect:  Depressed and Flat  Cognitive:  Kept nodding off  Insight:  Engaged  Engagement in Therapy:  Engaged  Modes of Intervention:  Clarification, Education, Limit-setting, Problem-solving, Socialization, Support and Processing, Exploration, Discussion   Ambrose Mantle, LCSW 03/24/2012, 11:17 AM

## 2012-03-24 NOTE — Progress Notes (Signed)
D: Pt has been up and has been active and has been participating in various milieu activities this evening. Pt seen interacting appropriately within the milieu, pt has received medications this evening without incident, pt spoke about having thoughts of depression and anxiety during the day and also some suicidal thoughts but able to contract for safety on the unit.  A: Pt was offered support and encouragement. Pt was encourage to attend groups. Q 15 minute checks were done for safety.  R:Pt attends groups and interacts with peers and staff. Safety maintained.

## 2012-03-25 MED ORDER — PANTOPRAZOLE SODIUM 40 MG PO TBEC
40.0000 mg | DELAYED_RELEASE_TABLET | Freq: Every day | ORAL | Status: DC
  Administered 2012-03-25 – 2012-03-28 (×4): 40 mg via ORAL
  Filled 2012-03-25 (×6): qty 1

## 2012-03-25 NOTE — Tx Team (Signed)
Interdisciplinary Treatment Plan Update (Adult)  Date:  03/25/2012  Time Reviewed:  8:22 AM   Progress in Treatment: Attending groups: Yes Participating in groups:  Yes Taking medication as prescribed: Yes Tolerating medication:  Yes Family/Significant othe contact made:  To be assessed Patient understands diagnosis:  Yes Discussing patient identified problems/goals with staff:  Yes Medical problems stabilized or resolved:  Yes Denies suicidal/homicidal ideation: Yes Issues/concerns per patient self-inventory:  None identified Other: N/A  New problem(s) identified: None Identified  Reason for Continuation of Hospitalization: Depression Medication stabilization Suicidal ideation  Interventions implemented related to continuation of hospitalization: mood stabilization, medication monitoring and adjustment, group therapy and psycho education, suicide risk assessment, collateral contact, aftercare planning, ongoing physician assessments and safety checks q 15 mins  Additional comments: N/A  Estimated length of stay: 3-5 days  Discharge Plan: CSW is assessing for appropriate referrals.  Patient may possibly be able to [facility]  New goal(s): N/A  Review of initial/current patient goals per problem list:    1.  Goal(s): Address substance use by completing detox protocol  Met:  yes  Target date: 03/24/12  As evidenced by: completed [facility]  2.  Goal (s): Reduce depressive symptoms from a 10 to a 3  Met:  No  Target date: 3-5 days  As evidenced by: Pt rates at a 7  3.  Goal (s): Reduce anxiety symptoms from a 10 to a 3  Met:  No  Target date:  3-5 days  As evidenced by: Pt rates at a  8  4.  Goal(s):Eliminate thoughts of self harm  Met:  No  Target date: d/c date  As evidenced JY:NWGNFA thoughts of self harm   Attendees: Patient:     Family:     Physician: Geoffery Lyons, MD 03/25/2012 8:22 AM   Nursing: Dellia Cloud, RN 03/25/2012  8:22 AM   Clinical Social Worker:  Reyes Ivan, LCSWA 03/25/2012  8:22 AM   Other: Verna Czech, LCSW 03/25/2012  8:22 AM   Other:  Nanine Means, NP   Other:  Olivia Mackie, psych intern   Other:  Robbie Louis, RN   Other:      Scribe for Treatment Team:   Reyes Ivan 03/25/2012 8:22 AM

## 2012-03-25 NOTE — Progress Notes (Signed)
Pt has been up for groups today and has been interacting with peers and staff appropriately.  He reported his sleep as poor appetite improving energy level low and ability to pay attention as improving.  He rated his depression a 8 hopelessness and anxiety both an 8 on his self-inventory.  He c/o diarrhea but denied any need for medication.  He c/o reflux and was started on protonix today.  He did ask for maalox at 1254 with positive outcome.  He does have passive S/I but has no plan and does contract for safety on the unit.

## 2012-03-25 NOTE — Progress Notes (Signed)
Psychoeducational Group Note  Date:  03/23/2012 Time:  1515  Group Topic/Focus:  Conflict Resolution:   The focus of this group is to discuss the conflict resolution process and how it may be used upon discharge.  Participation Level:  Did Not Attend  Participation Quality:    Affect:    Cognitive:    Insight:    Engagement in Group:    Additional Comments:  none  Isamar Nazir M 03/25/2012, 10:40 AM

## 2012-03-25 NOTE — Progress Notes (Signed)
[facility] LCSW Group Therapy  03/25/2012 4:20 PM  Type of Therapy:  Group Therapy  Participation Level:  Active  Participation Quality:  Sharing  Affect:  Defensive and Depressed  Cognitive:  Oriented  Insight:  Lacking  Engagement in Therapy:  Resistant  Modes of Intervention:  Discussion, Exploration and Problem-solving  Summary of Progress/Problems: The topic for group today was overcoming obstacles.  Pt discussed overcoming obstacles and what this means for pt. Patient discussed increased anxiety and hopelessness as major obstacles.  Patient discussed feelings of loneliness and depression often shape his decision making and he finds it extremely difficult to get past this.  Patient discussed extreme anxiety and fear of discharging back to the same environment.  Patient had difficulty verbalizing coping skills in dealing with these obstacles.   Verna Czech [place] 03/25/2012, 4:20 PM

## 2012-03-25 NOTE — Progress Notes (Signed)
[facility] MD Progress Note  03/25/2012 1:01 PM [name]  MRN:  [id number] Subjective:  [name] endorses that he continues to have a hard time. He is still ruminating about what he has been through. Thinking about the losses he has had, having a hard time thinking of moving on. He is trying to get himself at a point in which he can move forward, but he finds himself stuck, with a lot of anger, unresolved grief. Admits to suicidal ideas, anger that he is afraid to get out Diagnosis:   Axis I: Polysubstance Dependence, Unspecified Bipolar Axis II: Deferred Axis III:  Past Medical History  Diagnosis Date  . Arthritis   . Hypertension   . Anxiety   . Bipolar affective disorder   . Depression   . Alcohol abuse   . Cocaine abuse     smokes crack-cocaine  . GERD (gastroesophageal reflux disease)   . Chronic back pain   . Tobacco abuse   . Hepatitis C   . Vitamin D deficiency   . Gout   . Hemorrhoids   . Diabetes mellitus without complication    Axis IV: housing problems and problems with primary support group Axis V: 51-60 moderate symptoms  ADL's:  Intact  Sleep: Fair  Appetite:  Fair  Suicidal Ideation:  Plan:  ideas no plans Intent:  denies Means:  denies Homicidal Ideation:  Plan:  denies Intent:  denies Means:  denies AEB (as evidenced by):  Psychiatric Specialty Exam: Review of Systems  Constitutional: Negative.   HENT: Negative.   Eyes: Negative.   Respiratory: Negative.   Cardiovascular: Negative.   Gastrointestinal: Positive for heartburn.  Genitourinary: Negative.   Musculoskeletal: Negative.   Skin: Negative.   Neurological: Negative.   Endo/Heme/Allergies: Negative.   Psychiatric/Behavioral: Positive for depression and substance abuse. The patient is nervous/anxious and has insomnia.     Blood pressure 128/79, pulse 89, temperature 98.9 F (37.2 C), temperature source Oral, resp. rate 16, height 5\' 7"  (1.702 m), weight 113.399 kg (250 lb).Body mass index  is 39.16 kg/(m^2).  General Appearance: Disheveled  Eye Solicitor::  Fair  Speech:  Clear, Coherent, slow not spontaneous  Volume:  Decreased  Mood:  Depressed, Hopeless and Irritable  Affect:  Restricted  Thought Process:  Coherent and Goal Directed  Orientation:  Full (Time, Place, and Person)  Thought Content:  Rumination  Suicidal Thoughts:  Yes.  without intent/plan  Homicidal Thoughts:  Yes.  without intent/plan  Memory:  Immediate;   Fair Recent;   Fair Remote;   Fair  Judgement:  Fair  Insight:  Present  Psychomotor Activity:  Decreased  Concentration:  Fair  Recall:  Fair  Akathisia:  No  Handed:  Right  AIMS (if indicated):     Assets:  Desire for Improvement  Sleep:  Number of Hours: 6.5    Current Medications: Current Facility-Administered Medications  Medication Dose Route Frequency Provider Last Rate Last Dose  . acetaminophen (TYLENOL) tablet 650 mg  650 mg Oral Q6H PRN Cleotis Nipper, MD   650 mg at 03/21/12 2158  . alum & mag hydroxide-simeth (MAALOX/MYLANTA) 200-200-20 MG/5ML suspension 30 mL  30 mL Oral Q4H PRN Cleotis Nipper, MD   30 mL at 03/25/12 1254  . amLODipine (NORVASC) tablet 10 mg  10 mg Oral Daily Cleotis Nipper, MD   10 mg at 03/25/12 0818  . carbamazepine (TEGRETOL XR) 12 hr tablet 200 mg  200 mg Oral BID Rachael Fee,  MD   200 mg at 03/25/12 0818  . lisinopril (PRINIVIL,ZESTRIL) tablet 40 mg  40 mg Oral Daily Cleotis Nipper, MD   40 mg at 03/25/12 0818  . magnesium hydroxide (MILK OF MAGNESIA) suspension 30 mL  30 mL Oral Daily PRN Cleotis Nipper, MD      . meloxicam [facility]) tablet 7.5 mg  7.5 mg Oral BID Rachael Fee, MD   7.5 mg at 03/25/12 3086  . multivitamin with minerals tablet 1 tablet  1 tablet Oral Daily Rachael Fee, MD   1 tablet at 03/25/12 0818  . thiamine (VITAMIN B-1) tablet 100 mg  100 mg Oral Daily Rachael Fee, MD   100 mg at 03/25/12 0818  . traZODone (DESYREL) tablet 50 mg  50 mg Oral QHS PRN,MR X 1 Cleotis Nipper, MD   50 mg at  03/24/12 2128    Lab Results: No results found for this or any previous visit (from the past 48 hour(s)).  Physical Findings: AIMS: Facial and Oral Movements Muscles of Facial Expression: None, normal Lips and Perioral Area: None, normal Jaw: None, normal Tongue: None, normal,Extremity Movements Upper (arms, wrists, hands, fingers): None, normal Lower (legs, knees, ankles, toes): None, normal, Trunk Movements Neck, shoulders, hips: None, normal, Overall Severity Severity of abnormal movements (highest score from questions above): None, normal Incapacitation due to abnormal movements: None, normal Patient's awareness of abnormal movements (rate only patient's report): No Awareness, Dental Status Current problems with teeth and/or dentures?: No Does patient usually wear dentures?: No  CIWA:  CIWA-Ar Total: 0  COWS:     Treatment Plan Summary: Daily contact with patient to assess and evaluate symptoms and progress in treatment Medication management Protonix 40 mg daily Plan: Supportive approach/coping skills/relapse prevention Medical Decision Making Problem Points:  Review of last therapy session (1) and Review of psycho-social stressors (1) Data Points:  Review of medication regiment & side effects (2) Review of new medications or change in dosage (2)  I certify that inpatient services furnished can reasonably be expected to improve the patient's condition.   Gertude Benito A 03/25/2012, 1:01 PM

## 2012-03-25 NOTE — Progress Notes (Signed)
[facility] LCSW Aftercare Discharge Planning Group Note  03/25/2012 8:28 AM  Participation Quality:  Attentive  Affect:  Anxious and Depressed  Cognitive:  Oriented  Insight:  Developing/Improving  Engagement in Group:  Improving  Modes of Intervention:  Discussion, Exploration, Problem-solving and Support  Summary of Progress/Problems: Patient attended discharge planning group.  Patient discussed feeling much better, however complains of knee and back pain(he will address with doctor) Per patient he will like to address the pain without narcotics.  Patient reports continued dreams and thoughts of hurting herself, reports feeling safe in the hospital. Patient reports he will return possibly to [facility] house.  Safety Planning discussed. Verna Czech [place] 03/25/2012, 8:28 AM

## 2012-03-25 NOTE — Progress Notes (Signed)
Psychoeducational Group Note  Date:  03/25/2012 Time:  1100  Group Topic/Focus:  Self Care:   The focus of this group is to help patients understand the importance of self-care in order to improve or restore emotional, physical, spiritual, interpersonal, and financial health.  Participation Level:  Did Not Attend  Participation Quality:    Affect:    Cognitive:    Insight:    Engagement in Group:    Additional Comments:  Pt was sleeping   Lane Kjos M 03/25/2012, 1:27 PM

## 2012-03-26 DIAGNOSIS — F319 Bipolar disorder, unspecified: Principal | ICD-10-CM

## 2012-03-26 DIAGNOSIS — F1010 Alcohol abuse, uncomplicated: Secondary | ICD-10-CM

## 2012-03-26 DIAGNOSIS — F1910 Other psychoactive substance abuse, uncomplicated: Secondary | ICD-10-CM

## 2012-03-26 MED ORDER — LORATADINE 10 MG PO TABS
10.0000 mg | ORAL_TABLET | Freq: Every day | ORAL | Status: DC
  Administered 2012-03-26 – 2012-03-28 (×3): 10 mg via ORAL
  Filled 2012-03-26 (×5): qty 1

## 2012-03-26 MED ORDER — CITALOPRAM HYDROBROMIDE 20 MG PO TABS
20.0000 mg | ORAL_TABLET | Freq: Every day | ORAL | Status: DC
  Administered 2012-03-26 – 2012-03-27 (×2): 20 mg via ORAL
  Filled 2012-03-26 (×3): qty 1

## 2012-03-26 NOTE — Progress Notes (Signed)
[facility] Group Notes:  (Counselor/Nursing/MHT/Case Management/Adjunct)  Type of Therapy:  Psychoeducational Skills  Participation Level:  Active  Participation Quality:  Appropriate, Attentive, Monopolizing, Redirectable, Sharing and Supportive  Affect:  Appropriate  Cognitive:  Alert, Appropriate and Oriented  Insight:  Appropriate  Engagement in Group:  Engaged  Engagement in Therapy:  n/a  Modes of Intervention:  Activity, Discussion, Education, Problem-solving, Rapport Building, Socialization and Support  Summary of Progress/Problems: Earnestine attended psychoeducational group that focused on using quality time with support systems/individuals to engage in health coping skills. Jahson participated in activity guessing about self and peers. Jelani was active while group discussed who their support systems are, how they can spend positive quality time with them as a coping skills and a way to strengthen their relationship. Egon was given a homework assignment to find two ways to improve his support systems and twenty activities he can do to spend quality time with his supports.   Wandra Scot 03/26/2012 4:18 PM

## 2012-03-26 NOTE — Progress Notes (Signed)
Patient ID: [name], male   DOB: 1960/05/25, 51 y.o.   MRN: [id number]  D: Pt was brighter than last week.However, still expresses depressed mood and passive si. Pt was observed in the dayroom playing cards and interacting with his peers. Voiced no concerns or complaints.   A: Offered support and encouragement. 15 min checks continued for safety.  R: Pt remains safe.

## 2012-03-26 NOTE — Progress Notes (Signed)
Patient ID: [name], male   DOB: July 15, 1960, 51 y.o.   MRN: [id number] [facility] MD Progress Note  03/26/2012 11:58 AM GRAYSIN LUCZYNSKI  MRN:  [id number]  Subjective:  On 03/20/12, I attempted to take my own life by overdose on Cocaine, Ambien and a muscle relaxant. It has been 1 year since I lost my fiance due to aneursym and 18 months since I lost by baby-daughter. I miss them a lot. I feel like I am alone. I don't want to hurt any more. I'm tired. That was why I tried to kill myself. The mistake I made was to call my mother afterwards. She turned around and called 911. I am trying very hard to cope, but it is not easy. I cry all the time. Each night that I fall asleep, I see them in my dreams talking to me. It seems so real, but I can't have them in real life. I like seeing them in my dreams though. I still have SI, but I am not planning on hurting myself any longer. I itch all over, I think I'm coming down with a cold".  Diagnosis:   Axis I: Polysubstance Dependence, Unspecified Bipolar Axis II: Deferred Axis III:  Past Medical History  Diagnosis Date  . Arthritis   . Hypertension   . Anxiety   . Bipolar affective disorder   . Depression   . Alcohol abuse   . Cocaine abuse     smokes crack-cocaine  . GERD (gastroesophageal reflux disease)   . Chronic back pain   . Tobacco abuse   . Hepatitis C   . Vitamin D deficiency   . Gout   . Hemorrhoids   . Diabetes mellitus without complication    Axis IV: housing problems and problems with primary support group Axis V: 51-60 moderate symptoms  ADL's:  Intact  Sleep: Fair per patient's reports, (6.25 hours per documentation)  Appetite:  Fair  Suicidal Ideation: "Yes, off and on" Plan:  ideas no plans Intent:  denies Means:  denies Homicidal Ideation: "No" Denies.  AEB (as evidenced by): Per patient's reports.  Psychiatric Specialty Exam: Review of Systems  Constitutional: Negative.   HENT: Negative.   Eyes: Negative.    Respiratory: Positive for cough (Complained of cold symptoms.).   Cardiovascular: Negative.   Gastrointestinal: Negative.  Negative for heartburn.  Genitourinary: Negative.   Musculoskeletal: Negative.   Skin: Positive for itching (No rashes noted, however, observed some scratch marks to right arm area. Currently started on Claritin 10 mg.  ).  Neurological: Negative.   Endo/Heme/Allergies: Negative.   Psychiatric/Behavioral: Positive for depression (Will initiate Citalopram 20 mg daily today.), suicidal ideas ("Off and on without plans") and substance abuse. Negative for hallucinations. The patient is nervous/anxious and has insomnia (Currently on Trazodone for sleep Q hs).     Blood pressure 139/89, pulse 105, temperature 97.5 F (36.4 C), temperature source Oral, resp. rate 20, height 5\' 7"  (1.702 m), weight 113.399 kg (250 lb).Body mass index is 39.16 kg/(m^2).  General Appearance: Disheveled, Obese  Eye Contact::  Fair  Speech:  Clear, Coherent, slow not spontaneous  Volume:  Decreased  Mood:  Depressed, Hopeless and Irritable  Affect:  Restricted  Thought Process:  Coherent and Goal Directed  Orientation:  Full (Time, Place, and Person)  Thought Content:  Rumination  Suicidal Thoughts:  Yes.  without intent/plan  Homicidal Thoughts:  Denies  Memory:  Immediate;   Fair Recent;   Fair Remote;  Fair  Judgement:  Fair  Insight:  Present  Psychomotor Activity:  Decreased  Concentration:  Fair  Recall:  Fair  Akathisia:  No  Handed:  Right  AIMS (if indicated):     Assets:  Desire for Improvement  Sleep:  Number of Hours: 6.25    Current Medications: Current Facility-Administered Medications  Medication Dose Route Frequency Provider Last Rate Last Dose  . acetaminophen (TYLENOL) tablet 650 mg  650 mg Oral Q6H PRN Cleotis Nipper, MD   650 mg at 03/21/12 2158  . alum & mag hydroxide-simeth (MAALOX/MYLANTA) 200-200-20 MG/5ML suspension 30 mL  30 mL Oral Q4H PRN Cleotis Nipper,  MD   30 mL at 03/25/12 1254  . amLODipine (NORVASC) tablet 10 mg  10 mg Oral Daily Cleotis Nipper, MD   10 mg at 03/26/12 0844  . carbamazepine (TEGRETOL XR) 12 hr tablet 200 mg  200 mg Oral BID Rachael Fee, MD   200 mg at 03/26/12 0844  . lisinopril (PRINIVIL,ZESTRIL) tablet 40 mg  40 mg Oral Daily Cleotis Nipper, MD   40 mg at 03/26/12 0844  . loratadine (CLARITIN) tablet 10 mg  10 mg Oral Daily Sanjuana Kava, NP      . magnesium hydroxide (MILK OF MAGNESIA) suspension 30 mL  30 mL Oral Daily PRN Cleotis Nipper, MD      . meloxicam (MOBIC) tablet 7.5 mg  7.5 mg Oral BID Rachael Fee, MD   7.5 mg at 03/26/12 0844  . multivitamin with minerals tablet 1 tablet  1 tablet Oral Daily Rachael Fee, MD   1 tablet at 03/26/12 [id number]  . pantoprazole (PROTONIX) EC tablet 40 mg  40 mg Oral Daily Rachael Fee, MD   40 mg at 03/26/12 0844  . thiamine (VITAMIN B-1) tablet 100 mg  100 mg Oral Daily Rachael Fee, MD   100 mg at 03/26/12 0844  . traZODone (DESYREL) tablet 50 mg  50 mg Oral QHS PRN,MR X 1 Cleotis Nipper, MD   50 mg at 03/24/12 2128    Lab Results: No results found for this or any previous visit (from the past 48 hour(s)).  Physical Findings: AIMS: Facial and Oral Movements Muscles of Facial Expression: None, normal Lips and Perioral Area: None, normal Jaw: None, normal Tongue: None, normal,Extremity Movements Upper (arms, wrists, hands, fingers): None, normal Lower (legs, knees, ankles, toes): None, normal, Trunk Movements Neck, shoulders, hips: None, normal, Overall Severity Severity of abnormal movements (highest score from questions above): None, normal Incapacitation due to abnormal movements: None, normal Patient's awareness of abnormal movements (rate only patient's report): No Awareness, Dental Status Current problems with teeth and/or dentures?: No Does patient usually wear dentures?: No  CIWA:  CIWA-Ar Total: 0  COWS:     Treatment Plan Summary: Daily contact with patient to  assess and evaluate symptoms and progress in treatment Medication management Protonix 40 mg daily  Plan: Supportive approach/coping skills/relapse prevention. Initiate Loratadine 10 mg daily for itching and allergy symptoms. Citalopram 20 mg for depression. Continue to monitor for any adverse effects and or reactions related to treatment regimen. Continue current treatment plan.  Medical Decision Making Problem Points:  Review of last therapy session (1) and Review of psycho-social stressors (1) Data Points:  Review of medication regiment & side effects (2) Review of new medications or change in dosage (2)  I certify that inpatient services furnished can reasonably be expected to improve the patient's  condition.   Armandina Stammer I 03/26/2012, 11:58 AM

## 2012-03-26 NOTE — Progress Notes (Signed)
Psychoeducational Group Note  Date:  03/26/2012 Time:  1100  Group Topic/Focus:  Recovery Goals:   The focus of this group is to identify appropriate goals for recovery and establish a plan to achieve them.  Participation Level:  Minimal  Participation Quality:  Drowsy  Affect:  Flat  Cognitive:  Appropriate  Insight:  Poor  Engagement in Group:  Poor  Additional Comments: Pt appeared tired during group and at times appeared to be sleeping. Pt was very limited in his interactions.   Sharyn Lull 03/26/2012, 3:25 PM

## 2012-03-26 NOTE — Clinical Social Work Note (Addendum)
[facility] LCSW Group Therapy  03/26/2012  1:15 PM   Type of Therapy:  Group Therapy  Participation Level:  Did Not Attend group with [employer] speaker  Lauralyn Primes 03/26/2012 2:28 PM

## 2012-03-26 NOTE — Clinical Social Work Note (Signed)
[facility] LCSW Aftercare Discharge Planning Group Note  03/26/2012 8:45 AM  Participation Quality:  Resistant  Affect:  Anxious, Depressed, Excited and Irritable  Cognitive:  Lacking  Insight:  Limited  Engagement in Group:  Limited  Modes of Intervention:  Clarification, Discussion, Education, Exploration, Orientation, Problem-solving, Rapport Building, Socialization and Support  Summary of Progress/Problems: Pt attended discharge planning group and actively participated in group.  CSW provided pt with today's workbook.  Pt presents with irritable affect and mood.  When CSW was speaking with another pt about SI, pt told his peer that CSW wanted him to lie to d/c.  CSW told pt that CSW did not say that.  When it was time for pt to check in pt stated that he was "Okay and fine" and continued to repeat that to every question asked.  No further needs voiced by pt at this time.    [name], LCSWA 03/26/2012 10:53 AM

## 2012-03-26 NOTE — Progress Notes (Signed)
D:  Patient up and in the milieu.  Has attended some groups today, but dozes off during groups.  Rates depression and anxiety at 7 today and endorses off and on thoughts of self harm without intent or plan.  Patient agrees to seek out staff if feeling unsafe.  A:  Medications as ordered.  Encouraged patient to participate in milieu therapies.   R:  Pleasant and cooperative on the unit.  Tolerating medications well.  Interacting well with staff and peers.  Remains safe on the unit.

## 2012-03-26 NOTE — Progress Notes (Signed)
Patient ID: [name], male   DOB: 03-27-1961, 51 y.o.   MRN: [id number] D: Pt is awake and active on the unit this AM. Pt endorses SI but he is able to contract for safety. Pt is participating in the milieu somewhat and is cooperative with staff. Pt rates their depression at 7 and hopelessness at 7. Pt's most recent CIWA score was 0. Pt mood is depressed and his affect is sad/apathetic. Pt states that he just wants to sent home "so that he can do what he needs to do," referring to suicidal gestures.   A: Writer utilized therapeutic communication, encouraged pt to discuss feelings with staff and administered medication per MD orders. Writer also encouraged pt to attend groups. Pt forwards little and is guarded when staff engages.    R: Pt is attending groups and tolerating medications well but is lethargic and often falls asleep during groups. Writer will continue to monitor. 15 minute checks are ongoing for safety.

## 2012-03-27 MED ORDER — PANTOPRAZOLE SODIUM 40 MG PO TBEC: 40.0000 mg | DELAYED_RELEASE_TABLET | Freq: Every day | ORAL | Status: DC

## 2012-03-27 MED ORDER — IBUPROFEN 600 MG PO TABS
600.0000 mg | ORAL_TABLET | Freq: Four times a day (QID) | ORAL | Status: DC | PRN
  Administered 2012-03-27: 600 mg via ORAL
  Filled 2012-03-27: qty 1

## 2012-03-27 MED ORDER — CARBAMAZEPINE ER 200 MG PO TB12: 200.0000 mg | ORAL_TABLET | Freq: Two times a day (BID) | ORAL | Status: DC

## 2012-03-27 MED ORDER — ZOLPIDEM TARTRATE 5 MG PO TABS: 5.0000 mg | ORAL_TABLET | Freq: Every evening | ORAL | Status: DC | PRN

## 2012-03-27 MED ORDER — LIDOCAINE 5 % EX PTCH
1.0000 | MEDICATED_PATCH | Freq: Once | CUTANEOUS | Status: AC
  Administered 2012-03-27: 1 via TRANSDERMAL
  Filled 2012-03-27: qty 1

## 2012-03-27 MED ORDER — GABAPENTIN 300 MG PO CAPS
300.0000 mg | ORAL_CAPSULE | Freq: Three times a day (TID) | ORAL | Status: DC
  Administered 2012-03-27 – 2012-03-28 (×3): 300 mg via ORAL
  Filled 2012-03-27 (×6): qty 1

## 2012-03-27 MED ORDER — AMLODIPINE BESYLATE 10 MG PO TABS: 10.0000 mg | ORAL_TABLET | Freq: Every day | ORAL | Status: DC

## 2012-03-27 MED ORDER — DULOXETINE HCL 30 MG PO CPEP
30.0000 mg | ORAL_CAPSULE | Freq: Once | ORAL | Status: AC
  Administered 2012-03-27: 30 mg via ORAL
  Filled 2012-03-27: qty 1

## 2012-03-27 MED ORDER — DULOXETINE HCL 30 MG PO CPEP: 30.0000 mg | ORAL_CAPSULE | Freq: Every day | ORAL | Status: DC

## 2012-03-27 MED ORDER — LIDOCAINE 5 % EX PTCH
1.0000 | MEDICATED_PATCH | Freq: Every day | CUTANEOUS | Status: DC
  Administered 2012-03-28: 1 via TRANSDERMAL
  Filled 2012-03-27 (×2): qty 1

## 2012-03-27 MED ORDER — LISINOPRIL 40 MG PO TABS: 40.0000 mg | ORAL_TABLET | Freq: Every day | ORAL | Status: DC

## 2012-03-27 MED ORDER — GABAPENTIN 300 MG PO CAPS: 300.0000 mg | ORAL_CAPSULE | Freq: Three times a day (TID) | ORAL | Status: DC

## 2012-03-27 MED ORDER — DULOXETINE HCL 30 MG PO CPEP
30.0000 mg | ORAL_CAPSULE | Freq: Every day | ORAL | Status: DC
  Administered 2012-03-28: 30 mg via ORAL
  Filled 2012-03-27 (×2): qty 1

## 2012-03-27 MED ORDER — MELOXICAM 7.5 MG PO TABS: 7.5000 mg | ORAL_TABLET | Freq: Two times a day (BID) | ORAL | Status: DC

## 2012-03-27 MED ORDER — LIDOCAINE 5 % EX PTCH: 1.0000 | MEDICATED_PATCH | Freq: Every day | CUTANEOUS | Status: DC

## 2012-03-27 MED ORDER — ZOLPIDEM TARTRATE 5 MG PO TABS
5.0000 mg | ORAL_TABLET | Freq: Every evening | ORAL | Status: DC | PRN
  Administered 2012-03-27: 5 mg via ORAL
  Filled 2012-03-27: qty 1

## 2012-03-27 MED ORDER — LIDOCAINE 5 % EX PTCH: 1.0000 | MEDICATED_PATCH | CUTANEOUS | Status: DC

## 2012-03-27 NOTE — Progress Notes (Addendum)
Pt still reporting poor sleep appetite good energy hyper ability to pay attention as improving.  He rated all his depression hopelessness and anxiety an 8 on his self-inventory.  He still reports agitation and passive S/I he denied any plans and does contract while here in the hospital. He denied any H/I to this nurse but pt was wanting to be discharged to get his belongings and come back to hospital.  The director, the assistant director and [facility] were able to obtain pt's belongings after he signed a wavier of his property.  He was allowed to go into search room with the previous people mentioned to go through his belongings.  Pt had some medication changes that were to help with his chronic pain that were not narcotics and his trazodone was changed to [place].  He has been much calmer after his belongings were brought to him.  It seems pt has a bed at [facility] that he may discharge tomorrow or Friday at this time unsure if pt is aware.   Pt is now been told that he will be going to [facility] not [facility] and although a bit resistant he does not want to go to a place that will deal with his substance abuse issues.  Dr. Dub Mikes has talked with pt and he is in agreement at this time.  Pt should be ready for discharge early in the morning and is now talking very positive since a peer talked with him about [facility].  Pt will not need any meds to take with him to [facility].

## 2012-03-27 NOTE — [facility] Suicide Risk Assessment (Signed)
Suicide Risk Assessment  Discharge Assessment     Demographic Factors:  Male, Living alone and Unemployed  Mental Status Per Nursing Assessment::   On Admission:  Suicidal ideation indicated by patient;Plan includes specific time, place, or method;Self-harm thoughts;Belief that plan would result in death  Current Mental Status by Physician: Suicide ideation indicated by: Patient  Loss Factors: Loss of significant relationship and Financial problems/change in socioeconomic status  Historical Factors: Prior suicide attempts and Impulsivity  Risk Reduction Factors:   Sense of responsibility to family  Continued Clinical Symptoms:  Bipolar Disorder:   Depressive phase Alcohol/Substance Abuse/Dependencies  Cognitive Features That Contribute To Risk:  Closed-mindedness Thought constriction (tunnel vision)    Suicide Risk:  Moderate:  Frequent suicidal ideation with limited intensity, and duration, some specificity in terms of plans, no associated intent, good self-control, limited dysphoria/symptomatology, some risk factors present, and identifiable protective factors, including available and accessible social support.  Discharge Diagnoses:   AXIS I:  Polysubstance Dependence, Bipolar Disorder AXIS II:  Deferred AXIS III:   Past Medical History  Diagnosis Date  . Arthritis   . Hypertension   . Anxiety   . Bipolar affective disorder   . Depression   . Alcohol abuse   . Cocaine abuse     smokes crack-cocaine  . GERD (gastroesophageal reflux disease)   . Chronic back pain   . Tobacco abuse   . Hepatitis C   . Vitamin D deficiency   . Gout   . Hemorrhoids   . Diabetes mellitus without complication    AXIS IV:  housing problems AXIS V:  51-60 moderate symptoms  Plan Of Care/Follow-up recommendations:  Activity:  As tolerated Diet:  Regular To be committed to [facility] 03/28/2012 Is patient on multiple antipsychotic therapies at discharge:  No   Has Patient had three  or more failed trials of antipsychotic monotherapy by history:  No  Recommended Plan for Multiple Antipsychotic Therapies: N/A   Snow Peoples A 03/27/2012, 3:42 PM

## 2012-03-27 NOTE — Tx Team (Signed)
Interdisciplinary Treatment Plan Update (Adult)  Date:  03/27/2012  Time Reviewed:  8:07 AM   Progress in Treatment: Attending groups: Yes Participating in groups:  Yes Taking medication as prescribed: Yes Tolerating medication:  Yes Family/Significant othe contact made:   Patient understands diagnosis:  Yes Discussing patient identified problems/goals with staff:  Yes Medical problems stabilized or resolved:  Yes Denies suicidal/homicidal ideation: Yes Issues/concerns per patient self-inventory:  None identified Other: N/A  New problem(s) identified: None Identified  Reason for Continuation of Hospitalization: Depression Medication stabilization Suicidal ideation  Interventions implemented related to continuation of hospitalization: mood stabilization, medication monitoring and adjustment, group therapy and psycho education, suicide risk assessment, collateral contact, aftercare planning, ongoing physician assessments and safety checks q 15 mins  Additional comments: N/A  Estimated length of stay: 3-5 days  Discharge Plan: [facility], currently waiting on bed availability  New goal(s): N/A  Review of initial/current patient goals per problem list:    1.  Goal(s): Address substance use by completing detox protocol  Met:  yes  Target date: 03/24/12  As evidenced by: completed [facility] detox on 03/24/12  2.  Goal (s): Reduce depressive symptoms from a 10 to a 3  Met:  No  Target date: 3-5 days  As evidenced by: Pt rates at a 10  3.  Goal (s): Reduce anxiety symptoms from a 10 to a 3  Met:  No  Target date:  3-5 days  As evidenced by: Pt rates at a  10  4.  Goal(s):Eliminate thoughts of self harm  Met:  No  Target date: d/c  As evidenced by: denial of suicide ideation   Attendees: Patient:     Family:     Physician: Geoffery Lyons, MD 03/27/2012 8:07 AM   Nursing:  03/27/2012 8:07 AM   Clinical Social Worker:  Reyes Ivan, LCSWA 03/27/2012  8:07 AM   Other: Verna Czech, LCSW 03/27/2012  8:07 AM   Other:  Robbie Louis, RN   Other:     Other:     Other:      Scribe for Treatment Team:   Reyes Ivan 03/27/2012 8:07 AM

## 2012-03-27 NOTE — Progress Notes (Signed)
[facility] MD Progress Note  03/27/2012 11:14 AM [name]  MRN:  [id number]  Subjective: "I'm not sleeping at all. Trazodone is not helping me. I have back and knee pains. I was on Hydrocodone for pain and Ambien for sleep. I'm tired of having to feel like I'm feeling. I just want to die. I have nothing to live for any more.  I have written to my mother. I informed her to keep me in her heart, and not be mad at me because I needed to go be with my daughter and fiance in [place]. I told her that I will always love her. I will keep this letter in my pocket so that it can be found and given to my mother after I die. I am not going to do anything crazy while I'm in the hospital. I will not kill myself here. The thought is always going on in my head".  Diagnosis:   Axis I: Alcohol Abuse and Major depressive disorder, Polysubstance abuse Axis II: Deferred Axis III:  Past Medical History  Diagnosis Date  . Arthritis   . Hypertension   . Anxiety   . Bipolar affective disorder   . Depression   . Alcohol abuse   . Cocaine abuse     smokes crack-cocaine  . GERD (gastroesophageal reflux disease)   . Chronic back pain   . Tobacco abuse   . Hepatitis C   . Vitamin D deficiency   . Gout   . Hemorrhoids   . Diabetes mellitus without complication    Axis IV: other psychosocial or environmental problems and Polysubstance abuse/dependency Axis V: 11-20 some danger of hurting self or others possible OR occasionally fails to maintain minimal personal hygiene OR gross impairment in communication  ADL's:  Intact  Sleep: Poor per patient's report.  Appetite:  Good  Suicidal Ideation: "yes" Plan:  NO Intent:  No Means:  No Homicidal Ideation: "No" Plan:  No Intent:  No Means:  No  AEB (as evidenced by): Per patient's reports.  Psychiatric Specialty Exam: Review of Systems  Constitutional: Negative.   Eyes: Negative.   Respiratory: Negative.   Cardiovascular: Negative.    Gastrointestinal: Negative.   Genitourinary: Negative.   Musculoskeletal: Positive for back pain and joint pain.  Skin: Positive for itching.  Neurological: Positive for headaches.  Endo/Heme/Allergies: Negative.   Psychiatric/Behavioral: Positive for depression, suicidal ideas and substance abuse (Hx of). Negative for hallucinations and memory loss. The patient has insomnia. The patient is not nervous/anxious.     Blood pressure 144/103, pulse 106, temperature 97.5 F (36.4 C), temperature source Oral, resp. rate 20, height 5\' 7"  (1.702 m), weight 113.399 kg (250 lb).Body mass index is 39.16 kg/(m^2).  General Appearance: Casual and Obese  Eye Contact::  Fair  Speech:  Clear and Coherent  Volume:  Decreased  Mood:  Depressed, Dysphoric and Hopeless  Affect:  Flat and Tearful  Thought Process:  Tangential  Orientation:  Full (Time, Place, and Person)  Thought Content:  Rumination  Suicidal Thoughts:  Yes.  without intent/plan  Homicidal Thoughts:  No  Memory:  Immediate;   Good Recent;   Good Remote;   Good  Judgement:  Impaired  Insight:  Lacking  Psychomotor Activity:  Decreased  Concentration:  Fair  Recall:  Good  Akathisia:  No  Handed:  Right  AIMS (if indicated):     Assets:  Desire for Improvement  Sleep:  Number of Hours: 6    Current Medications:  Current Facility-Administered Medications  Medication Dose Route Frequency Provider Last Rate Last Dose  . acetaminophen (TYLENOL) tablet 650 mg  650 mg Oral Q6H PRN Cleotis Nipper, MD   650 mg at 03/21/12 2158  . alum & mag hydroxide-simeth (MAALOX/MYLANTA) 200-200-20 MG/5ML suspension 30 mL  30 mL Oral Q4H PRN Cleotis Nipper, MD   30 mL at 03/25/12 1254  . amLODipine (NORVASC) tablet 10 mg  10 mg Oral Daily Cleotis Nipper, MD   10 mg at 03/27/12 0749  . carbamazepine (TEGRETOL XR) 12 hr tablet 200 mg  200 mg Oral BID Rachael Fee, MD   200 mg at 03/27/12 0749  . DULoxetine (CYMBALTA) DR capsule 30 mg  30 mg Oral Daily  Sanjuana Kava, NP      . ibuprofen (ADVIL,MOTRIN) tablet 600 mg  600 mg Oral Q6H PRN Sanjuana Kava, NP      . lisinopril (PRINIVIL,ZESTRIL) tablet 40 mg  40 mg Oral Daily Cleotis Nipper, MD   40 mg at 03/27/12 0749  . loratadine (CLARITIN) tablet 10 mg  10 mg Oral Daily Sanjuana Kava, NP   10 mg at 03/27/12 0750  . magnesium hydroxide (MILK OF MAGNESIA) suspension 30 mL  30 mL Oral Daily PRN Cleotis Nipper, MD      . meloxicam (MOBIC) tablet 7.5 mg  7.5 mg Oral BID Rachael Fee, MD   7.5 mg at 03/27/12 0750  . multivitamin with minerals tablet 1 tablet  1 tablet Oral Daily Rachael Fee, MD   1 tablet at 03/27/12 0749  . pantoprazole (PROTONIX) EC tablet 40 mg  40 mg Oral Daily Rachael Fee, MD   40 mg at 03/27/12 0750  . thiamine (VITAMIN B-1) tablet 100 mg  100 mg Oral Daily Rachael Fee, MD   100 mg at 03/27/12 0750  . zolpidem (AMBIEN) tablet 5 mg  5 mg Oral QHS PRN Sanjuana Kava, NP        Lab Results: No results found for this or any previous visit (from the past 48 hour(s)).  Physical Findings: AIMS: Facial and Oral Movements Muscles of Facial Expression: None, normal Lips and Perioral Area: None, normal Jaw: None, normal Tongue: None, normal,Extremity Movements Upper (arms, wrists, hands, fingers): None, normal Lower (legs, knees, ankles, toes): None, normal, Trunk Movements Neck, shoulders, hips: None, normal, Overall Severity Severity of abnormal movements (highest score from questions above): None, normal Incapacitation due to abnormal movements: None, normal Patient's awareness of abnormal movements (rate only patient's report): No Awareness, Dental Status Current problems with teeth and/or dentures?: No Does patient usually wear dentures?: No  CIWA:  CIWA-Ar Total: 0  COWS:     Treatment Plan Summary: Daily contact with patient to assess and evaluate symptoms and progress in treatment Medication management  Plan: Discontinued Trazodone and Citalopram. Initiated  Cymbalta 30 mg daily for depression and adjunct for pain management. Ambien 5 mg Q bedtime for insomnia. Ibuprofen 600 mg Q 6 hours for pain. Continue current treatment regimen.  Medical Decision Making Problem Points:  Established problem, worsening (2), New problem, with additional work-up planned (4), Review of last therapy session (1) and Review of psycho-social stressors (1) Data Points:  Review and summation of old records (2) Review of medication regiment & side effects (2) Review of new medications or change in dosage (2)  I certify that inpatient services furnished can reasonably be expected to improve the patient's condition.  Armandina Stammer I 03/27/2012, 11:14 AM

## 2012-03-27 NOTE — Progress Notes (Signed)
[facility] Adult Case Management Discharge Plan :  Will you be returning to the same living situation after discharge: No.patient to be transferred to [facility] for dual diagnosis treatment. At discharge, do you have transportation home?:Yes,   Patient on IVC and will be transported by sheriff Do you have the ability to pay for your medications:Yes,     Release of information consent forms completed and in the chart;  [name] needed at discharge.  Patient to Follow up at: Follow-up Information    Follow up with [facility]. On 03/28/2012. ([facility] for dual dx treament)    Contact information:   [address] [place], [place] [address] [phone]         Patient denies SI/HI:   No. patient continues to report having suicidal thoughts requiring transfer to ensure safety.  Safety Planning and Suicide Prevention discussed:  Yes,     [name] 03/27/2012, 8:26 PM

## 2012-03-27 NOTE — Progress Notes (Signed)
[facility] LCSW Aftercare Discharge Planning Group Note  03/27/2012 8:07 AM  Participation Quality:  Attentive  Affect:  Irritable  Cognitive:  Lacking  Insight:  Limited  Engagement in Group:  Limited  Modes of Intervention:  Discussion, Exploration, Problem-solving and Support  Summary of Progress/Problems: Patient attended discharge planning group.  Patient easily agitated, irritable.  Reports wanting to leave the hospital to obtain his belongings and then return to the hospital to await placement.  Patient rates his depression and anxiety a 10.  He continues to report continuous suicide ideation.  [facility] completed [facility] and this CSW is waiting for a phone cal back regarding bed availability. Verna Czech Myrtle 03/27/2012, 8:07 AM

## 2012-03-27 NOTE — Progress Notes (Signed)
Psychoeducational Group Note  Date:  03/27/2012 Time:  1100  Group Topic/Focus:  Crisis Planning:   The purpose of this group is to help patients create a crisis plan for use upon discharge or in the future, as needed.  Participation Level:  Active  Participation Quality:  Appropriate, Sharing and Supportive  Affect:  Appropriate  Cognitive:  Appropriate  Insight:  Supportive  Engagement in Group:  Supportive  Additional Comments:  none  Feliz [place] M 03/27/2012, 12:05 PM

## 2012-03-27 NOTE — Progress Notes (Signed)
[facility] LCSW Group Therapy  03/27/2012 8:19 PM  Type of Therapy:  Group Therapy  Participation Level:  Minimal  Participation Quality:  Attentive  Affect:  Appropriate  Cognitive:  Appropriate  Insight:  Developing/Improving  Engagement in Therapy:  Engaged  Modes of Intervention:  Discussion, Exploration, Problem-solving and Support  Summary of Progress/Problems: The topic for group today was emotional regulation.  Pt participated in the discussion regarding what emotional regulation is and how it affects their life, positive and negative.  Pt discussed coping skills and ways they can regulate their emotions in a positive manner.  Patient discussed being in a good space today, feeling that his emotions are in balance and plans to maintains this by continuing to open up and discuss his feelings.  Verna Czech [place] 03/27/2012, 8:19 PM

## 2012-03-28 NOTE — Progress Notes (Signed)
Pt acknowledged understanding of discharge instructions. Stated that his plans are to finish at a dual diagnosis program, then go to [place] to visit his mother. Stated his mother wanted him to just, "forget about everything and come go up there>'  However, pt stated that he told his mother he needed to get himself together first. Pt stated he still has thoughts and dreams of harming himself, but contracts for safety. Pt received all property except those he is donating.

## 2012-03-28 NOTE — Clinical Social Work Note (Signed)
[facility] LCSW Aftercare Discharge Planning Group Note  03/28/2012 8:45 AM  Participation Quality:  Appropriate and Attentive  Affect:  Appropriate  Cognitive:  Alert and Appropriate  Insight:  Developing/Improving  Engagement in Group:  Developing/Improving  Modes of Intervention:  Clarification, Discussion, Education, Exploration, Orientation, Problem-solving, Rapport Building, Socialization and Support  Summary of Progress/Problems: Pt attended discharge planning group and actively participated in group.  CSW provided pt with today's workbook.  Pt rates depression at a 3-4 and anxiety at a 10 today.  Pt endorses SI but states he doesn't have a plan and won't do anything.  Pt is going to [facility] today for further treatment.  No further needs voiced by pt at this time.    Reyes Ivan, LCSWA 03/28/2012 9:01 AM

## 2012-03-29 NOTE — Discharge Summary (Signed)
Physician Discharge Summary Note  Patient:  [name] is an 51 y.o., male MRN:  [id number] DOB:  [birth year] Patient phone:  [phone] (home)  Patient address:   [address] [place] [address],   Date of Admission:  03/20/2012  Date of Discharge: 03/28/12  Reason for Admission:  Suicide attempt by overdose  Discharge Diagnoses: Active Problems:  Bipolar affective disorder  Polysubstance dependence  Alcohol withdrawal  Review of Systems  Constitutional: Negative.   HENT: Negative.   Eyes: Negative.   Respiratory: Negative.   Cardiovascular: Negative.   Gastrointestinal: Negative.   Genitourinary: Negative.   Musculoskeletal: Positive for back pain (Stabilized with medication upon discharge) and joint pain (Stabilized with medication upon discharge).  Skin: Negative.   Neurological: Negative.   Endo/Heme/Allergies: Negative.   Psychiatric/Behavioral: Positive for depression (Stabilized with medication upon discharge) and substance abuse (Received detox treatment and currently being discharged to [facility]). Negative for suicidal ideas, hallucinations and memory loss. The patient is not nervous/anxious and does not have insomnia.    Axis Diagnosis:   AXIS I:  Polysubstance abuse, Bipolar affective disorder, Alcohol abuse AXIS II:  Deferred AXIS III:   Past Medical History  Diagnosis Date  . Arthritis   . Hypertension   . Anxiety   . Bipolar affective disorder   . Depression   . Alcohol abuse   . Cocaine abuse     smokes crack-cocaine  . GERD (gastroesophageal reflux disease)   . Chronic back pain   . Tobacco abuse   . Hepatitis C   . Vitamin D deficiency   . Gout   . Hemorrhoids   . Diabetes mellitus without complication    AXIS IV:  Polysubstance abuse AXIS V:  62  Level of Care:  [facility]  Hospital Course:  Tried to kill himself yesterday. " A variety of things, doc" Living on the streets again, anniversary of girlfriend's death, not getting  along with the family. Staying in motels and hotels, Last time he had a stable place 3 years ago after she died he lost what she contributed. Tried to OD, called his mother telling her that he was not going to be there for her birthday or Christmas, mom called EMS. Drinking 2 (40 oz) two bottles of wine, a pint of liquor a day for a year. Did crack a day ago when he tried to OD, 10-12 muscle relaxers, hydrocodones.  After admission assessment and evaluation, it was determined that this patient will need detoxification treatment for his alcohol intoxication as well as treatment for his mood disorder. And his discharge plans included a referral to a long term treatment facility for more intense substance abuse treatment and stabilization. [name] was then started on Librium protocol for his alcohol detoxification. He was also enrolled in group counseling sessions and activities to learn coping skills that will him after discharge to cope better, manage his substance abuse problems to maintain a much longer sobriety. He also was enrolled and attended AA/NA meetings being offered and held on this unit. He has some previous and or identifiable medical conditions that required treatment and or monitoring. He received medication management for all those health issues as well. He was monitored closely for any potential problems that may arise as a result of and or during detoxification treatment. Patient tolerated his treatment regimen and detoxification treatment without any significant adverse effects and or reactions reported.  Patient attended treatment team meeting this am and met with  the treatment team members. His symptoms, substance abuse issues, response to treatment and discharge plans discussed. This has to be done with patient as he continuously endorse passive suicidal ideations without plans and or intent while a patient in this hospital. He also reports feelings like hurting others, but denies any  intent and plans to carry it out. He stated that he is feeling this way because, losing his daughter and fiance respectively was very unfair to him as he does not have anything else to live for in this life. He added that he is afraid to try to have another child for fear of losing that child as well. Patient was counseled on daily basis to verbalize his feelings as it is necessary to vent,  attend group sessions and apply his learned coping skills in times of distress.  Patient did endorse that he is doing well and stable for discharge to pursue the next phase of his substance abuse treatment.  He was encouraged to join and attend AA/NA meetings being offered in his community after his residential treatment. He was instructed on the importance of getting a trusted sponsor from the advise of others or from whomever within the AA meetings seems to make sense, and has a proven track record, and will hold him responsible for his sobriety, and both expects and insists on his total  abstinence from alcohol.   It was agreed upon between patient and the team that he will be discharged to [facility] for further substance abuse treatment. Upon discharge, patient adamantly denies suicidal, homicidal ideations, auditory, visual hallucinations, delusional thinking and or withdrawal symptoms. Patient left [facility] with all personal belongings in no apparent distress. Transportation per [employer] as patient is IVC .    Consults:  None  Significant Diagnostic Studies:  labs: CBC with diff, CMP, UDS, Toxicology tests  Discharge Vitals:   Blood pressure 149/103, pulse 112, temperature 97.5 F (36.4 C), temperature source Oral, resp. rate 20, height 5\' 7"  (1.702 m), weight 113.399 kg (250 lb). Body mass index is 39.16 kg/(m^2). Lab Results:   No results found for this or any previous visit (from the past 72 hour(s)).  Physical Findings: AIMS: Facial and Oral Movements Muscles of Facial Expression: None,  normal Lips and Perioral Area: None, normal Jaw: None, normal Tongue: None, normal,Extremity Movements Upper (arms, wrists, hands, fingers): None, normal Lower (legs, knees, ankles, toes): None, normal, Trunk Movements Neck, shoulders, hips: None, normal, Overall Severity Severity of abnormal movements (highest score from questions above): None, normal Incapacitation due to abnormal movements: None, normal Patient's awareness of abnormal movements (rate only patient's report): No Awareness, Dental Status Current problems with teeth and/or dentures?: No Does patient usually wear dentures?: No  CIWA:  CIWA-Ar Total: 0  COWS:     Psychiatric Specialty Exam: See Psychiatric Specialty Exam and Suicide Risk Assessment completed by Attending Physician prior to discharge.  Discharge destination:  [facility]  Is patient on multiple antipsychotic therapies at discharge:  No   Has Patient had three or more failed trials of antipsychotic monotherapy by history:  No  Recommended Plan for Multiple Antipsychotic Therapies: NA      Discharge Orders    Future Orders Please Complete By Expires   Diet - low sodium heart healthy      Increase activity slowly          Medication List     As of 03/29/2012 10:44 AM    TAKE these medications  Indication    amLODipine 10 MG tablet   Commonly known as: NORVASC   Take 1 tablet (10 mg total) by mouth daily. For hypertension       carbamazepine 200 MG 12 hr tablet   Commonly known as: TEGRETOL XR   Take 1 tablet (200 mg total) by mouth 2 (two) times daily. For mood stabilization    Indication: Manic-Depression      DULoxetine 30 MG capsule   Commonly known as: CYMBALTA   Take 1 capsule (30 mg total) by mouth daily. For depression       gabapentin 300 MG capsule   Commonly known as: NEURONTIN   Take 1 capsule (300 mg total) by mouth 3 (three) times daily. For anxiety/pain       lidocaine 5 %   Commonly known as: LIDODERM   Place 1 patch  onto the skin daily. Remove & Discard patch within 12 hours or as directed by MD: For pain       lisinopril 40 MG tablet   Commonly known as: PRINIVIL,ZESTRIL   Take 1 tablet (40 mg total) by mouth daily. For hypertension       meloxicam 7.5 MG tablet   Commonly known as: MOBIC   Take 1 tablet (7.5 mg total) by mouth 2 (two) times daily. For pain    Indication: arthritis      pantoprazole 40 MG tablet   Commonly known as: PROTONIX   Take 1 tablet (40 mg total) by mouth daily. For acid reflux    Indication: Gastroesophageal Reflux Disease      zolpidem 5 MG tablet   Commonly known as: AMBIEN   Take 1 tablet (5 mg total) by mouth at bedtime as needed for sleep. For insomnia.          Follow-up Information    Follow up with [facility]. On 03/28/2012. (Patient to be transferred by [facility] to [facility] for dual dx treament)    Contact information:   [address] [place], [place] [address] [phone]         Follow-up recommendations:  Activity:  as tolerated Other:  Keep all scheduled follow-up appointments as recommended.    Comments:  Take all your medications as prescribed by your mental healthcare provider. Report any adverse effects and or reactions from your medicines to your outpatient provider promptly. Patient is instructed and cautioned to not engage in alcohol and or illegal drug use while on prescription medicines. In the event of worsening symptoms, patient is instructed to call the crisis hotline, 911 and or go to the nearest ED for appropriate evaluation and treatment of symptoms. Follow-up with your primary care provider for your other medical issues, concerns and or health care needs.     Total Discharge Time:  Greater than 30 minutes  Signed: Armandina Stammer I 03/29/2012, 10:44 AM

## 2012-04-01 NOTE — Progress Notes (Signed)
Patient Discharge Instructions:  After Visit Summary (AVS):   Faxed to:  04/01/12 Psychiatric Admission Assessment Note:   Faxed to:  04/01/12 Suicide Risk Assessment - Discharge Assessment:   Faxed to:  04/01/12 Faxed/Sent to the Next Level Care provider:  04/01/12 Faxed to [facility] @ [phone]  Jerelene Redden, 04/01/2012, 2:58 PM

## 2012-04-04 NOTE — Discharge Summary (Signed)
Agree with assessment and plan Lazer Wollard A. Miguelina Fore, M.D. 

## 2012-05-16 ENCOUNTER — Emergency Department (HOSPITAL_COMMUNITY)
Admission: EM | Admit: 2012-05-16 | Discharge: 2012-05-17 | Disposition: A | Discharge status: Other Acute Inpatient Hospital | Payer: PRIVATE HEALTH INSURANCE | Attending: Emergency Medicine | Admitting: Emergency Medicine

## 2012-05-16 ENCOUNTER — Encounter (HOSPITAL_COMMUNITY): Payer: Self-pay | Admitting: Emergency Medicine

## 2012-05-16 DIAGNOSIS — F1410 Cocaine abuse, uncomplicated: Secondary | ICD-10-CM

## 2012-05-16 DIAGNOSIS — F919 Conduct disorder, unspecified: Secondary | ICD-10-CM | POA: Insufficient documentation

## 2012-05-16 DIAGNOSIS — M109 Gout, unspecified: Secondary | ICD-10-CM | POA: Insufficient documentation

## 2012-05-16 DIAGNOSIS — M129 Arthropathy, unspecified: Secondary | ICD-10-CM | POA: Insufficient documentation

## 2012-05-16 DIAGNOSIS — K219 Gastro-esophageal reflux disease without esophagitis: Secondary | ICD-10-CM | POA: Insufficient documentation

## 2012-05-16 DIAGNOSIS — E119 Type 2 diabetes mellitus without complications: Secondary | ICD-10-CM | POA: Insufficient documentation

## 2012-05-16 DIAGNOSIS — Z8719 Personal history of other diseases of the digestive system: Secondary | ICD-10-CM | POA: Insufficient documentation

## 2012-05-16 DIAGNOSIS — F411 Generalized anxiety disorder: Secondary | ICD-10-CM | POA: Insufficient documentation

## 2012-05-16 DIAGNOSIS — I10 Essential (primary) hypertension: Secondary | ICD-10-CM | POA: Insufficient documentation

## 2012-05-16 DIAGNOSIS — Z87891 Personal history of nicotine dependence: Secondary | ICD-10-CM | POA: Insufficient documentation

## 2012-05-16 DIAGNOSIS — F1020 Alcohol dependence, uncomplicated: Secondary | ICD-10-CM | POA: Insufficient documentation

## 2012-05-16 DIAGNOSIS — Z8739 Personal history of other diseases of the musculoskeletal system and connective tissue: Secondary | ICD-10-CM | POA: Insufficient documentation

## 2012-05-16 DIAGNOSIS — F319 Bipolar disorder, unspecified: Secondary | ICD-10-CM | POA: Insufficient documentation

## 2012-05-16 LAB — ETHANOL: Alcohol, Ethyl (B): <11 mg/dL (ref 0–11)

## 2012-05-16 LAB — URINALYSIS, ROUTINE W REFLEX MICROSCOPIC
Bilirubin Urine: NEGATIVE
Glucose, UA: NEGATIVE mg/dL
Hgb urine dipstick: NEGATIVE
Leukocytes, UA: NEGATIVE
Nitrite: NEGATIVE
Protein, ur: NEGATIVE mg/dL
Specific Gravity, Urine: 1.013 (ref 1.005–1.030)
Urobilinogen, UA: 1 mg/dL (ref 0.0–1.0)
pH: 6.5 (ref 5.0–8.0)

## 2012-05-16 LAB — COMPREHENSIVE METABOLIC PANEL
ALT: 166 U/L — ABNORMAL HIGH (ref 0–53)
AST: 110 U/L — ABNORMAL HIGH (ref 0–37)
Albumin: 4.2 g/dL (ref 3.5–5.2)
Alkaline Phosphatase: 64 U/L (ref 39–117)
BUN: 11 mg/dL (ref 6–23)
CO2: 17 mEq/L — ABNORMAL LOW (ref 19–32)
Calcium: 9.7 mg/dL (ref 8.4–10.5)
Chloride: 100 mEq/L (ref 96–112)
Creatinine, Ser: 0.86 mg/dL (ref 0.50–1.35)
GFR calc Af Amer: >90 mL/min (ref >90)
GFR calc non Af Amer: >90 mL/min (ref >90)
Glucose, Bld: 102 mg/dL — ABNORMAL HIGH (ref 70–99)
Potassium: 4.2 mEq/L (ref 3.5–5.1)
Sodium: 134 mEq/L — ABNORMAL LOW (ref 135–145)
Total Bilirubin: 0.5 mg/dL (ref 0.3–1.2)
Total Protein: 8.6 g/dL — ABNORMAL HIGH (ref 6.0–8.3)

## 2012-05-16 LAB — RAPID URINE DRUG SCREEN, HOSP PERFORMED
Amphetamines: NOT DETECTED
Barbiturates: NOT DETECTED
Benzodiazepines: NOT DETECTED
Cocaine: POSITIVE — AB
Opiates: NOT DETECTED
Tetrahydrocannabinol: POSITIVE — AB

## 2012-05-16 LAB — CBC WITH DIFFERENTIAL/PLATELET
Basophils Absolute: 0 10*3/uL (ref 0.0–0.1)
Basophils Relative: 0 % (ref 0–1)
Eosinophils Absolute: 0 10*3/uL (ref 0.0–0.7)
Eosinophils Relative: 0 % (ref 0–5)
HCT: 47.1 % (ref 39.0–52.0)
Hemoglobin: 16.3 g/dL (ref 13.0–17.0)
Lymphocytes Relative: 23 % (ref 12–46)
Lymphs Abs: 2 10*3/uL (ref 0.7–4.0)
MCH: 28.2 pg (ref 26.0–34.0)
MCHC: 34.6 g/dL (ref 30.0–36.0)
MCV: 81.5 fL (ref 78.0–100.0)
Monocytes Absolute: 0.6 10*3/uL (ref 0.1–1.0)
Monocytes Relative: 6 % (ref 3–12)
Neutro Abs: 6.1 10*3/uL (ref 1.7–7.7)
Neutrophils Relative %: 70 % (ref 43–77)
Platelets: 263 10*3/uL (ref 150–400)
RBC: 5.78 MIL/uL (ref 4.22–5.81)
RDW: 14.4 % (ref 11.5–15.5)
WBC: 8.7 10*3/uL (ref 4.0–10.5)

## 2012-05-16 LAB — CARBAMAZEPINE LEVEL, TOTAL: Carbamazepine Lvl: <0.5 ug/mL — ABNORMAL LOW (ref 4.0–12.0)

## 2012-05-16 LAB — TROPONIN I
Troponin I: <0.3 ng/mL (ref <0.30)
Troponin I: <0.3 ng/mL (ref <0.30)

## 2012-05-16 LAB — CK: Total CK: 152 U/L (ref 7–232)

## 2012-05-16 MED ORDER — ONDANSETRON HCL 4 MG PO TABS: 4.0000 mg | ORAL_TABLET | Freq: Three times a day (TID) | ORAL | Status: DC | PRN

## 2012-05-16 MED ORDER — ZIPRASIDONE MESYLATE 20 MG IM SOLR: 10.0000 mg | Freq: Four times a day (QID) | INTRAMUSCULAR | Status: DC | PRN

## 2012-05-16 MED ORDER — LORAZEPAM 2 MG/ML IJ SOLN
INTRAMUSCULAR | Status: AC
  Filled 2012-05-16: qty 1

## 2012-05-16 MED ORDER — ACETAMINOPHEN 325 MG PO TABS: 650.0000 mg | ORAL_TABLET | ORAL | Status: DC | PRN

## 2012-05-16 MED ORDER — DIPHENHYDRAMINE HCL 50 MG/ML IJ SOLN
INTRAMUSCULAR | Status: AC
  Filled 2012-05-16: qty 1

## 2012-05-16 MED ORDER — LORAZEPAM 1 MG PO TABS: 1.0000 mg | ORAL_TABLET | Freq: Four times a day (QID) | ORAL | Status: DC | PRN

## 2012-05-16 MED ORDER — ZIPRASIDONE MESYLATE 20 MG IM SOLR
20.0000 mg | Freq: Once | INTRAMUSCULAR | Status: AC
  Administered 2012-05-16: 20 mg via INTRAMUSCULAR
  Filled 2012-05-16: qty 20

## 2012-05-16 MED ORDER — LORAZEPAM 2 MG/ML IJ SOLN
1.0000 mg | Freq: Once | INTRAMUSCULAR | Status: AC
  Administered 2012-05-16: 1 mg via INTRAVENOUS

## 2012-05-16 MED ORDER — SODIUM CHLORIDE 0.9 % IV SOLN
1000.0000 mL | INTRAVENOUS | Status: DC
  Administered 2012-05-16: 1000 mL via INTRAVENOUS

## 2012-05-16 MED ORDER — DIPHENHYDRAMINE HCL 50 MG/ML IJ SOLN
50.0000 mg | Freq: Once | INTRAMUSCULAR | Status: AC
  Administered 2012-05-16: 50 mg via INTRAVENOUS

## 2012-05-16 MED ORDER — ALUM & MAG HYDROXIDE-SIMETH 200-200-20 MG/5ML PO SUSP: 30.0000 mL | ORAL | Status: DC | PRN

## 2012-05-16 MED ORDER — ZOLPIDEM TARTRATE 10 MG PO TABS: 10.0000 mg | ORAL_TABLET | Freq: Every evening | ORAL | Status: DC | PRN

## 2012-05-16 MED ORDER — IBUPROFEN 600 MG PO TABS: 600.0000 mg | ORAL_TABLET | Freq: Three times a day (TID) | ORAL | Status: DC | PRN

## 2012-05-16 MED ORDER — NICOTINE 21 MG/24HR TD PT24: 21.0000 mg | MEDICATED_PATCH | Freq: Every day | TRANSDERMAL | Status: DC

## 2012-05-16 MED ORDER — SODIUM CHLORIDE 0.9 % IV SOLN
1000.0000 mL | Freq: Once | INTRAVENOUS | Status: AC
  Administered 2012-05-16: 1000 mL via INTRAVENOUS

## 2012-05-16 MED ORDER — LORAZEPAM 2 MG/ML IJ SOLN
1.0000 mg | Freq: Once | INTRAMUSCULAR | Status: AC
  Administered 2012-05-16: 1 mg via INTRAVENOUS
  Filled 2012-05-16: qty 1

## 2012-05-16 MED ORDER — LORAZEPAM 2 MG/ML IJ SOLN: 1.0000 mg | Freq: Four times a day (QID) | INTRAMUSCULAR | Status: DC | PRN

## 2012-05-16 NOTE — ED Provider Notes (Signed)
History     CSN: [id number]  Arrival date & time 05/16/12  1450   First MD Initiated Contact with Patient 05/16/12 1458      Chief Complaint altered mental status  (Consider location/radiation/quality/duration/timing/severity/associated sxs/prior treatment) HPI  Patient evidently called EMS and he was having chest pain. However when he got in and let he started having some bizarre behavior. On arrival to the ED patient is rambling and is hard to focus on why he is here. He does not recall calling EMS. He cannot tell me why he is here. When asked questions he just asks them back. Patient noted to be very agitated and is glaring at staff and seems to have on the verge of physical violence. He does state he has chest pain indicates his upper chest that's about all he can say. He also talks a bit about anxiety.  Past Medical History  Diagnosis Date  . Arthritis   . Hypertension   . Anxiety   . Bipolar affective disorder   . Depression   . Alcohol abuse   . Cocaine abuse     smokes crack-cocaine  . GERD (gastroesophageal reflux disease)   . Chronic back pain   . Tobacco abuse   . Hepatitis C   . Vitamin D deficiency   . Gout   . Hemorrhoids   . Diabetes mellitus without complication     History reviewed. No pertinent past surgical history.  Family History  Problem Relation Age of Onset  . Hypertension Brother   . Diabetes Brother   . Hypertension Mother   . Diabetes Mother   . Arthritis Mother   . Other Sister     bone disease    History  Substance Use Topics  . Smoking status: Former Smoker    Quit date: 11/23/2011  . Smokeless tobacco: Not on file  . Alcohol Use: No  polysubstance abuse in the past    Review of Systems  Unable to perform ROS: Psychiatric disorder    Allergies  Review of patient's allergies indicates no known allergies.  Home Medications   Current Outpatient Rx  Name  Route  Sig  Dispense  Refill  . AMLODIPINE BESYLATE 10 MG PO TABS    Oral   Take 1 tablet (10 mg total) by mouth daily. For hypertension   30 tablet   0   . CARBAMAZEPINE ER 200 MG PO TB12   Oral   Take 1 tablet (200 mg total) by mouth 2 (two) times daily. For mood stabilization   60 tablet   0   . DULOXETINE HCL 30 MG PO CPEP   Oral   Take 1 capsule (30 mg total) by mouth daily. For depression   30 capsule   0   . GABAPENTIN 300 MG PO CAPS   Oral   Take 1 capsule (300 mg total) by mouth 3 (three) times daily. For anxiety/pain   90 capsule   0   . LIDOCAINE 5 % EX PTCH   Transdermal   Place 1 patch onto the skin daily. Remove & Discard patch within 12 hours or as directed by MD: For pain   30 patch      . LISINOPRIL 40 MG PO TABS   Oral   Take 1 tablet (40 mg total) by mouth daily. For hypertension   30 tablet   0   . MELOXICAM 7.5 MG PO TABS   Oral   Take 1 tablet (7.5 mg total) by  mouth 2 (two) times daily. For pain         . PANTOPRAZOLE SODIUM 40 MG PO TBEC   Oral   Take 1 tablet (40 mg total) by mouth daily. For acid reflux   30 tablet   0   . ZOLPIDEM TARTRATE 5 MG PO TABS   Oral   Take 1 tablet (5 mg total) by mouth at bedtime as needed for sleep. For insomnia.   5 tablet   0     BP 140/102  Pulse 115  Temp 98.7 F (37.1 C) (Oral)  Resp 18  SpO2 90%  Vital signs normal except hypertension, tachycardia   Physical Exam  Nursing note and vitals reviewed. Constitutional: He appears well-developed and well-nourished.  Non-toxic appearance. He does not appear ill. No distress.  HENT:  Head: Normocephalic and atraumatic.  Right Ear: External ear normal.  Left Ear: External ear normal.  Nose: Nose normal. No mucosal edema or rhinorrhea.  Mouth/Throat: Oropharynx is clear and moist and mucous membranes are normal. No dental abscesses or uvula swelling.  Eyes: Conjunctivae normal and EOM are normal. Pupils are equal, round, and reactive to light.  Neck: Normal range of motion and full passive range of motion  without pain. Neck supple.  Cardiovascular: Normal rate, regular rhythm and normal heart sounds.  Exam reveals no gallop and no friction rub.   No murmur heard. Pulmonary/Chest: Effort normal and breath sounds normal. No respiratory distress. He has no wheezes. He has no rhonchi. He has no rales. He exhibits no tenderness and no crepitus.  Abdominal: Soft. Normal appearance and bowel sounds are normal. He exhibits no distension. There is no tenderness. There is no rebound and no guarding.  Musculoskeletal: Normal range of motion. He exhibits no edema and no tenderness.       Moves all extremities well.   Neurological: He is alert. He has normal strength. No cranial nerve deficit.       Patient will not answer questions to see if he is oriented  Skin: Skin is warm, dry and intact. No rash noted. No erythema. No pallor.  Psychiatric: His speech is normal. His mood appears not anxious.       Patient is agitated, he is very verbally aggressive and is physically threatening at times, patient is talking about things that aren't pertinent to what is being asked. At some point and thought maybe he was talking to people that weren't in the room.    ED Course  Procedures (including critical care time)  Medications  0.9 %  sodium chloride infusion (0 mL Intravenous Stopped 05/16/12 1930)    Followed by  0.9 %  sodium chloride infusion (1000 mL Intravenous New Bag/Given 05/16/12 1929)  ziprasidone (GEODON) injection 20 mg (20 mg Intramuscular Given 05/16/12 1518)  LORazepam (ATIVAN) injection 1 mg (1 mg Intravenous Given 05/16/12 1802)  diphenhydrAMINE (BENADRYL) injection 50 mg (50 mg Intravenous Given 05/16/12 1840)  LORazepam (ATIVAN) injection 1 mg (1 mg Intravenous Given 05/16/12 1839)     Patient started escalating and he was given Geodon 20 mg IM. IVC papers were filled out by me.  Patient remained very agitated, he had to be placed in restraints for fear he would fall off the bed. He was finally given  Ativan 2 mg IV with Benadryl. He is now calm.  1930 patient states he had been doing cocaine during the night. He states he thought he was having a heart attack today. We'll repeat his  troponin. Patient states he does not remember his behavior when he came to the ED. I will check a second troponin on him and then do a tele -psych consult.  23:15 Dr Henderson Cloud, telepsychiatrist recommends inpatient psychiatric care, recommends geodon 10 mg IM q6h prn agitation (max 40 mg daily) and ativan 1 mg po/IM q6h prn agitation  Will consult ACT and repeat his BMET in the am to make sure his metabolic acidosis has cleared.   23:23 Aurther Loft, ACT will see patient.   Results for orders placed during the hospital encounter of 05/16/12  CBC WITH DIFFERENTIAL      Component Value Range   WBC 8.7  4.0 - 10.5 K/uL   RBC 5.78  4.22 - 5.81 MIL/uL   Hemoglobin 16.3  13.0 - 17.0 g/dL   HCT 56.2  13.0 - 86.5 %   MCV 81.5  78.0 - 100.0 fL   MCH 28.2  26.0 - 34.0 pg   MCHC 34.6  30.0 - 36.0 g/dL   RDW 78.4  69.6 - 29.5 %   Platelets 263  150 - 400 K/uL   Neutrophils Relative 70  43 - 77 %   Neutro Abs 6.1  1.7 - 7.7 K/uL   Lymphocytes Relative 23  12 - 46 %   Lymphs Abs 2.0  0.7 - 4.0 K/uL   Monocytes Relative 6  3 - 12 %   Monocytes Absolute 0.6  0.1 - 1.0 K/uL   Eosinophils Relative 0  0 - 5 %   Eosinophils Absolute 0.0  0.0 - 0.7 K/uL   Basophils Relative 0  0 - 1 %   Basophils Absolute 0.0  0.0 - 0.1 K/uL  COMPREHENSIVE METABOLIC PANEL      Component Value Range   Sodium 134 (*) 135 - 145 mEq/L   Potassium 4.2  3.5 - 5.1 mEq/L   Chloride 100  96 - 112 mEq/L   CO2 17 (*) 19 - 32 mEq/L   Glucose, Bld 102 (*) 70 - 99 mg/dL   BUN 11  6 - 23 mg/dL   Creatinine, Ser 2.84  0.50 - 1.35 mg/dL   Calcium 9.7  8.4 - 13.2 mg/dL   Total Protein 8.6 (*) 6.0 - 8.3 g/dL   Albumin 4.2  3.5 - 5.2 g/dL   AST 440 (*) 0 - 37 U/L   ALT 166 (*) 0 - 53 U/L   Alkaline Phosphatase 64  39 - 117 U/L   Total Bilirubin 0.5  0.3  - 1.2 mg/dL   GFR calc non Af Amer >90  >90 mL/min   GFR calc Af Amer >90  >90 mL/min  TROPONIN I      Component Value Range   Troponin I <0.30  <0.30 ng/mL  ETHANOL      Component Value Range   Alcohol, Ethyl (B) <11  0 - 11 mg/dL  URINALYSIS, ROUTINE W REFLEX MICROSCOPIC      Component Value Range   Color, Urine YELLOW  YELLOW   APPearance CLEAR  CLEAR   Specific Gravity, Urine 1.013  1.005 - 1.030   pH 6.5  5.0 - 8.0   Glucose, UA NEGATIVE  NEGATIVE mg/dL   Hgb urine dipstick NEGATIVE  NEGATIVE   Bilirubin Urine NEGATIVE  NEGATIVE   Ketones, ur TRACE (*) NEGATIVE mg/dL   Protein, ur NEGATIVE  NEGATIVE mg/dL   Urobilinogen, UA 1.0  0.0 - 1.0 mg/dL   Nitrite NEGATIVE  NEGATIVE   Leukocytes, UA  NEGATIVE  NEGATIVE  URINE RAPID DRUG SCREEN (HOSP PERFORMED)      Component Value Range   Opiates NONE DETECTED  NONE DETECTED   Cocaine POSITIVE (*) NONE DETECTED   Benzodiazepines NONE DETECTED  NONE DETECTED   Amphetamines NONE DETECTED  NONE DETECTED   Tetrahydrocannabinol POSITIVE (*) NONE DETECTED   Barbiturates NONE DETECTED  NONE DETECTED  CARBAMAZEPINE LEVEL, TOTAL      Component Value Range   Carbamazepine Lvl <0.5 (*) 4.0 - 12.0 ug/mL  CK      Component Value Range   Total CK 152  7 - 232 U/L  TROPONIN I      Component Value Range   Troponin I <0.30  <0.30 ng/mL   Laboratory interpretation all normal except positive UDS for cocaine, mild metabolic acidosis     Date: 05/16/2012  Rate: 115  Rhythm: sinus tachycardia  QRS Axis: left  Intervals: normal  ST/T Wave abnormalities: normal  Conduction Disutrbances:none  Narrative Interpretation:   Old EKG Reviewed: changes noted from 03/20/2012 HR was normal at 80    1. Bipolar disorder   2. Cocaine abuse    Plan inpatient psychiatric admission   Devoria Albe, MD, FACEP    MDM          Ward Givens, MD 05/16/12 2325

## 2012-05-16 NOTE — ED Notes (Addendum)
Per EMS: called out for chest pain, then everything changed on the ride. Pt started acting out(modified behavior). Hx of bipolar ad depression. Pt was talking to "people" in the truck.

## 2012-05-16 NOTE — ED Notes (Signed)
2 bags in locker 37

## 2012-05-16 NOTE — ED Notes (Signed)
RUE:[id number]<JW> Expected date:<BR> Expected time:<BR> Means of arrival:<BR> Comments:<BR> 52YO-M behavorial/uncooperative

## 2012-05-16 NOTE — ED Notes (Signed)
Pt requesting to be released from restraints - pt exhibiting calm and cooperative behaviors at present - pt made aware of expected appropriate behaviors while in ED - pt verbalized understanding. Restraints removed - pt provided w/ sandwich and drink per pt request. Pt in no acute distress - A&Ox4, appears sleepy at present.

## 2012-05-17 ENCOUNTER — Encounter (HOSPITAL_COMMUNITY): Payer: Self-pay | Admitting: *Deleted

## 2012-05-17 NOTE — ED Provider Notes (Signed)
Pt stable awaiting placement  Benny Lennert, MD 05/17/12 [id number]

## 2012-05-17 NOTE — BH Assessment (Signed)
Assessment Note   [name] is a 52 y.o. male who initially presented to [facility] with chest pain.  While en route to [facility], pt.'s behavior became bizarre.  Upon arrival to the hospital, pt was rambling, confused and became agitated.  Pt was not able to communicate with emerg dept physician, difficulty answering questions and explaining why he came to the hospital.  Pt had to be restrained because of his aggressive attitude(screaming, yelling and threatening).  Pt is calm and restraints have been removed.  Pt tells this Clinical research associate he is SI w/plan to walk in front of vehicle--"I always have a plan".  Pt reports his stressors: (1) fiance died last year and (2) daughter died 2 yrs ago.  Pt admits to using THC, $30 worth wkly, last use was 2 days ago and also using Cocaine, $30 worth wkly, last use was 2 days ago.  Pt has tried to harm self 2x's in the past and numerous admissions with [facility], [facility] and other facilities.  [facility] [place] Pt reports hearing his fiance's and daughter's and talking to them. Pt is compliant with psych meds.  Pt has no current legal issues.   Axis I: MDD, Recurrent, Severe with Psych Features Axis II: Deferred Axis III:  Past Medical History  Diagnosis Date  . Arthritis   . Hypertension   . Anxiety   . Bipolar affective disorder   . Depression   . Alcohol abuse   . Cocaine abuse     smokes crack-cocaine  . GERD (gastroesophageal reflux disease)   . Chronic back pain   . Tobacco abuse   . Hepatitis C   . Vitamin D deficiency   . Gout   . Hemorrhoids   . Diabetes mellitus without complication    Axis IV: other psychosocial or environmental problems, problems related to social environment and problems with primary support group Axis V: 21-30 behavior considerably influenced by delusions or hallucinations OR serious impairment in judgment, communication OR inability to function in almost all areas  Past Medical History:  Past Medical History   Diagnosis Date  . Arthritis   . Hypertension   . Anxiety   . Bipolar affective disorder   . Depression   . Alcohol abuse   . Cocaine abuse     smokes crack-cocaine  . GERD (gastroesophageal reflux disease)   . Chronic back pain   . Tobacco abuse   . Hepatitis C   . Vitamin D deficiency   . Gout   . Hemorrhoids   . Diabetes mellitus without complication     History reviewed. No pertinent past surgical history.  Family History:  Family History  Problem Relation Age of Onset  . Hypertension Brother   . Diabetes Brother   . Hypertension Mother   . Diabetes Mother   . Arthritis Mother   . Other Sister     bone disease    Social History:  reports that he quit smoking about 5 months ago. He does not have any smokeless tobacco history on file. He reports that he uses illicit drugs (Marijuana and Cocaine). He reports that he does not drink alcohol.  Additional Social History:  Alcohol / Drug Use Pain Medications: See MAR  Prescriptions: See MAR  Over the Counter: See MAR  History of alcohol / drug use?: Yes Longest period of sobriety (when/how long): None  Negative Consequences of Use: Personal relationships;Financial Substance #3 Name of Substance 3: THC  3 -  Age of First Use: Unk  3 - Amount (size/oz): $30 3 - Frequency: Wkly  3 - Duration: On-going  3 - Last Use / Amount: 2 Days Ago  Substance #4 Name of Substance 4: Cocaine  4 - Age of First Use: Unk  4 - Amount (size/oz): $30 4 - Frequency: Wkly  4 - Duration: On-going  4 - Last Use / Amount: 2 Days Ago   CIWA: CIWA-Ar BP: 117/80 mmHg Pulse Rate: 88  COWS:    Allergies: No Known Allergies  Home Medications:  (Not in a hospital admission)  OB/GYN Status:  No LMP for male patient.  General Assessment Data [facility] of Assessment: [facility] Living Arrangements: Alone Can pt return to current living arrangement?: Yes Admission Status: Involuntary Is patient capable of signing voluntary admission?:  No Transfer from: Acute Hospital Referral Source: MD  Education Status Is patient currently in school?: No Current Grade: None  Highest grade of school patient has completed: None  Name of school: None  Contact person: None   Risk to self Suicidal Ideation: Yes-Currently Present Suicidal Intent: Yes-Currently Present Is patient at risk for suicide?: Yes Suicidal Plan?: Yes-Currently Present Specify Current Suicidal Plan: Jump in front of a vehicle  Access to Means: Yes Specify Access to Suicidal Means: Vehicles  What has been your use of drugs/alcohol within the last 12 months?: Abusing: THC, Cocaine  Previous Attempts/Gestures: Yes How many times?: 2  Other Self Harm Risks: Impulsive behaviors  Triggers for Past Attempts: Anniversary;Unpredictable (Fiance's Birthday approaching; Daughter died 2 yrs ago) Intentional Self Injurious Behavior: None Family Suicide History: No Recent stressful life event(s): Loss (Comment) (Fiance died last year; Daughter died 2 yrs ago ) Persecutory voices/beliefs?: No Depression: Yes Depression Symptoms: Feeling angry/irritable;Loss of interest in usual pleasures;Despondent Substance abuse history and/or treatment for substance abuse?: Yes Suicide prevention information given to non-admitted patients: Not applicable  Risk to Others Homicidal Ideation: No Thoughts of Harm to Others: No Current Homicidal Intent: No Current Homicidal Plan: No Access to Homicidal Means: No Identified Victim: None  History of harm to others?: No Assessment of Violence: None Noted Violent Behavior Description: None  Does patient have access to weapons?: No Criminal Charges Pending?: No Does patient have a court date: No  Psychosis Hallucinations: Auditory Delusions: None noted  Mental Status Report Appear/Hygiene: Other (Comment) (Appropriate ) Eye Contact: Poor Motor Activity: Unremarkable Speech: Logical/coherent Level of Consciousness: Alert Mood:  Depressed;Angry;Irritable;Sad Affect: Angry;Depressed;Irritable;Sad Anxiety Level: None Thought Processes: Coherent;Relevant Judgement: Impaired Orientation: Person;Place;Time;Situation Obsessive Compulsive Thoughts/Behaviors: None  Cognitive Functioning Concentration: Normal Memory: Recent Intact;Remote Intact IQ: Average Insight: Poor Impulse Control: Poor Appetite: Good Weight Loss: 0  Weight Gain: 0  Sleep: Decreased Total Hours of Sleep: 4  Vegetative Symptoms: None  ADLScreening [facility] Assessment Services) Patient's cognitive ability adequate to safely complete daily activities?: Yes Patient able to express need for assistance with ADLs?: Yes Independently performs ADLs?: Yes (appropriate for developmental age)  Abuse/Neglect [facility]) Physical Abuse: Denies Verbal Abuse: Denies Sexual Abuse: Denies  Prior Inpatient Therapy Prior Inpatient Therapy: Yes Prior Therapy Dates: 2002,2003,2004,2006,2010,2011,2012 Prior Therapy Facilty/Provider(s): [facility], [facility], [name]  Reason for Treatment: Bipolar, SA  Prior Outpatient Therapy Prior Outpatient Therapy: Yes Prior Therapy Dates: Currently  [facility] Reason for Treatment: Med Mgt  ADL Screening (condition at time of admission) Patient's cognitive ability adequate to safely complete daily activities?: Yes Patient able to express need for assistance with ADLs?: Yes Independently performs ADLs?: Yes (appropriate for developmental  age) Weakness of Legs: None Weakness of Arms/Hands: None  Home Assistive Devices/Equipment Home Assistive Devices/Equipment: None  Therapy Consults (therapy consults require a physician order) PT Evaluation Needed: No OT Evalulation Needed: No SLP Evaluation Needed: No Abuse/Neglect Assessment (Assessment to be complete while patient is alone) Physical Abuse: Denies Verbal Abuse: Denies Sexual Abuse: Denies Exploitation of patient/patient's resources:  Denies Self-Neglect: Denies Values / Beliefs Cultural Requests During Hospitalization: None Spiritual Requests During Hospitalization: None Consults Spiritual Care Consult Needed: No Social Work Consult Needed: No Merchant navy officer (For Healthcare) Advance Directive: Patient does not have advance directive;Patient would not like information Pre-existing out of facility DNR order (yellow form or pink MOST form): No Nutrition Screen- [facility] Patient's home diet: Regular Have you recently lost weight without trying?: No Have you been eating poorly because of a decreased appetite?: No Malnutrition Screening Tool Score: 0   Additional Information 1:1 In Past 12 Months?: No CIRT Risk: No Elopement Risk: No Does patient have medical clearance?: Yes     Disposition:  Disposition Disposition of Patient: Inpatient treatment program;Referred to [facility] ) Type of inpatient treatment program: Adult Patient referred to: Other (Comment) [facility] )  On Site Evaluation by:   Reviewed with Physician:     Murrell Redden 05/17/2012 5:55 AM

## 2012-05-17 NOTE — ED Notes (Signed)
Pt transferred from main ED, presents with agitated behavior, depression, SI & HI, pt reports.  Pt reports she has visual hallucinations of seeing his girlfriend.  Feeling hopeless, states hx of Bipolar. Pt calm & cooperative at present.

## 2012-05-17 NOTE — BHH Counselor (Signed)
Writer received a call from [place] at [facility] that the pt has been accepted by Dr. Forrestine Him. The nurse was provided the number for report [phone]. Ranae Pila, LCAS

## 2012-07-13 ENCOUNTER — Other Ambulatory Visit (HOSPITAL_COMMUNITY): Payer: Self-pay

## 2012-07-13 ENCOUNTER — Observation Stay (HOSPITAL_COMMUNITY)
Admission: EM | Admit: 2012-07-13 | Discharge: 2012-07-16 | Disposition: A | Discharge status: Home / Self Care | Payer: PRIVATE HEALTH INSURANCE | Attending: Internal Medicine | Admitting: Internal Medicine

## 2012-07-13 ENCOUNTER — Encounter (HOSPITAL_COMMUNITY): Payer: Self-pay | Admitting: *Deleted

## 2012-07-13 ENCOUNTER — Emergency Department (HOSPITAL_COMMUNITY): Payer: PRIVATE HEALTH INSURANCE

## 2012-07-13 DIAGNOSIS — M79671 Pain in right foot: Secondary | ICD-10-CM

## 2012-07-13 DIAGNOSIS — G8929 Other chronic pain: Secondary | ICD-10-CM | POA: Insufficient documentation

## 2012-07-13 DIAGNOSIS — F1410 Cocaine abuse, uncomplicated: Secondary | ICD-10-CM | POA: Insufficient documentation

## 2012-07-13 DIAGNOSIS — F319 Bipolar disorder, unspecified: Secondary | ICD-10-CM | POA: Insufficient documentation

## 2012-07-13 DIAGNOSIS — I10 Essential (primary) hypertension: Secondary | ICD-10-CM | POA: Insufficient documentation

## 2012-07-13 DIAGNOSIS — M79609 Pain in unspecified limb: Secondary | ICD-10-CM | POA: Insufficient documentation

## 2012-07-13 DIAGNOSIS — F17200 Nicotine dependence, unspecified, uncomplicated: Secondary | ICD-10-CM | POA: Insufficient documentation

## 2012-07-13 DIAGNOSIS — Z5900 Homelessness unspecified: Secondary | ICD-10-CM | POA: Insufficient documentation

## 2012-07-13 DIAGNOSIS — B1920 Unspecified viral hepatitis C without hepatic coma: Secondary | ICD-10-CM | POA: Insufficient documentation

## 2012-07-13 DIAGNOSIS — F1110 Opioid abuse, uncomplicated: Secondary | ICD-10-CM | POA: Insufficient documentation

## 2012-07-13 DIAGNOSIS — F411 Generalized anxiety disorder: Secondary | ICD-10-CM | POA: Insufficient documentation

## 2012-07-13 DIAGNOSIS — B353 Tinea pedis: Secondary | ICD-10-CM | POA: Insufficient documentation

## 2012-07-13 DIAGNOSIS — E876 Hypokalemia: Secondary | ICD-10-CM | POA: Insufficient documentation

## 2012-07-13 DIAGNOSIS — M51379 Other intervertebral disc degeneration, lumbosacral region without mention of lumbar back pain or lower extremity pain: Secondary | ICD-10-CM | POA: Insufficient documentation

## 2012-07-13 DIAGNOSIS — M79672 Pain in left foot: Secondary | ICD-10-CM

## 2012-07-13 DIAGNOSIS — M5137 Other intervertebral disc degeneration, lumbosacral region: Secondary | ICD-10-CM | POA: Insufficient documentation

## 2012-07-13 DIAGNOSIS — R262 Difficulty in walking, not elsewhere classified: Secondary | ICD-10-CM | POA: Insufficient documentation

## 2012-07-13 DIAGNOSIS — F419 Anxiety disorder, unspecified: Secondary | ICD-10-CM | POA: Diagnosis present

## 2012-07-13 DIAGNOSIS — R4182 Altered mental status, unspecified: Secondary | ICD-10-CM | POA: Insufficient documentation

## 2012-07-13 DIAGNOSIS — R2681 Unsteadiness on feet: Secondary | ICD-10-CM

## 2012-07-13 DIAGNOSIS — R51 Headache: Secondary | ICD-10-CM | POA: Insufficient documentation

## 2012-07-13 DIAGNOSIS — E119 Type 2 diabetes mellitus without complications: Secondary | ICD-10-CM | POA: Insufficient documentation

## 2012-07-13 DIAGNOSIS — F1910 Other psychoactive substance abuse, uncomplicated: Secondary | ICD-10-CM | POA: Diagnosis present

## 2012-07-13 DIAGNOSIS — R269 Unspecified abnormalities of gait and mobility: Principal | ICD-10-CM | POA: Insufficient documentation

## 2012-07-13 DIAGNOSIS — F1010 Alcohol abuse, uncomplicated: Secondary | ICD-10-CM | POA: Diagnosis present

## 2012-07-13 DIAGNOSIS — M542 Cervicalgia: Secondary | ICD-10-CM | POA: Insufficient documentation

## 2012-07-13 DIAGNOSIS — K7689 Other specified diseases of liver: Secondary | ICD-10-CM | POA: Insufficient documentation

## 2012-07-13 LAB — CBC WITH DIFFERENTIAL/PLATELET
Basophils Absolute: 0 10*3/uL (ref 0.0–0.1)
Basophils Relative: 0 % (ref 0–1)
Eosinophils Absolute: 0.3 10*3/uL (ref 0.0–0.7)
Eosinophils Relative: 3 % (ref 0–5)
HCT: 44 % (ref 39.0–52.0)
Hemoglobin: 15.7 g/dL (ref 13.0–17.0)
Lymphocytes Relative: 22 % (ref 12–46)
Lymphs Abs: 2 10*3/uL (ref 0.7–4.0)
MCH: 28.9 pg (ref 26.0–34.0)
MCHC: 35.7 g/dL (ref 30.0–36.0)
MCV: 81 fL (ref 78.0–100.0)
Monocytes Absolute: 0.7 10*3/uL (ref 0.1–1.0)
Monocytes Relative: 8 % (ref 3–12)
Neutro Abs: 6.2 10*3/uL (ref 1.7–7.7)
Neutrophils Relative %: 68 % (ref 43–77)
Platelets: 273 10*3/uL (ref 150–400)
RBC: 5.43 MIL/uL (ref 4.22–5.81)
RDW: 14.8 % (ref 11.5–15.5)
WBC: 9.2 10*3/uL (ref 4.0–10.5)

## 2012-07-13 LAB — URINALYSIS, ROUTINE W REFLEX MICROSCOPIC
Glucose, UA: NEGATIVE mg/dL
Hgb urine dipstick: NEGATIVE
Ketones, ur: 15 mg/dL — AB
Leukocytes, UA: NEGATIVE
Nitrite: NEGATIVE
Protein, ur: NEGATIVE mg/dL
Specific Gravity, Urine: 1.015 (ref 1.005–1.030)
Urobilinogen, UA: 1 mg/dL (ref 0.0–1.0)
pH: 6.5 (ref 5.0–8.0)

## 2012-07-13 LAB — COMPREHENSIVE METABOLIC PANEL
ALT: 608 U/L — ABNORMAL HIGH (ref 0–53)
AST: 317 U/L — ABNORMAL HIGH (ref 0–37)
Albumin: 4.5 g/dL (ref 3.5–5.2)
Alkaline Phosphatase: 69 U/L (ref 39–117)
BUN: 9 mg/dL (ref 6–23)
CO2: 25 mEq/L (ref 19–32)
Calcium: 9.9 mg/dL (ref 8.4–10.5)
Chloride: 102 mEq/L (ref 96–112)
Creatinine, Ser: 1.08 mg/dL (ref 0.50–1.35)
GFR calc Af Amer: >90 mL/min (ref >90)
GFR calc non Af Amer: 78 mL/min — ABNORMAL LOW (ref >90)
Glucose, Bld: 100 mg/dL — ABNORMAL HIGH (ref 70–99)
Potassium: 2.9 mEq/L — ABNORMAL LOW (ref 3.5–5.1)
Sodium: 139 mEq/L (ref 135–145)
Total Bilirubin: 0.8 mg/dL (ref 0.3–1.2)
Total Protein: 8.3 g/dL (ref 6.0–8.3)

## 2012-07-13 LAB — RAPID URINE DRUG SCREEN, HOSP PERFORMED
Amphetamines: NOT DETECTED
Barbiturates: NOT DETECTED
Benzodiazepines: NOT DETECTED
Cocaine: POSITIVE — AB
Opiates: POSITIVE — AB
Tetrahydrocannabinol: POSITIVE — AB

## 2012-07-13 LAB — CK: Total CK: 872 U/L — ABNORMAL HIGH (ref 7–232)

## 2012-07-13 LAB — TROPONIN I: Troponin I: <0.3 ng/mL (ref <0.30)

## 2012-07-13 LAB — CK TOTAL AND CKMB (NOT AT ARMC)
CK, MB: 8.9 ng/mL (ref 0.3–4.0)
Relative Index: 0.6 (ref 0.0–2.5)
Total CK: 1542 U/L — ABNORMAL HIGH (ref 7–232)

## 2012-07-13 LAB — ETHANOL: Alcohol, Ethyl (B): <11 mg/dL (ref 0–11)

## 2012-07-13 MED ORDER — POTASSIUM CHLORIDE CRYS ER 20 MEQ PO TBCR
40.0000 meq | EXTENDED_RELEASE_TABLET | ORAL | Status: AC
  Administered 2012-07-13 (×2): 40 meq via ORAL
  Filled 2012-07-13 (×2): qty 1
  Filled 2012-07-13: qty 2

## 2012-07-13 MED ORDER — ENOXAPARIN SODIUM 40 MG/0.4ML ~~LOC~~ SOLN
40.0000 mg | SUBCUTANEOUS | Status: DC
  Administered 2012-07-13 – 2012-07-15 (×3): 40 mg via SUBCUTANEOUS
  Filled 2012-07-13 (×4): qty 0.4

## 2012-07-13 MED ORDER — SODIUM CHLORIDE 0.9 % IV SOLN
INTRAVENOUS | Status: DC
  Administered 2012-07-13: 08:00:00 via INTRAVENOUS

## 2012-07-13 MED ORDER — MORPHINE SULFATE 4 MG/ML IJ SOLN
4.0000 mg | Freq: Once | INTRAMUSCULAR | Status: AC
  Administered 2012-07-13: 4 mg via INTRAVENOUS
  Filled 2012-07-13 (×2): qty 1

## 2012-07-13 MED ORDER — IOHEXOL 300 MG/ML  SOLN
105.0000 mL | Freq: Once | INTRAMUSCULAR | Status: AC | PRN
  Administered 2012-07-13: 105 mL via INTRAVENOUS

## 2012-07-13 MED ORDER — GABAPENTIN 300 MG PO CAPS
300.0000 mg | ORAL_CAPSULE | Freq: Three times a day (TID) | ORAL | Status: DC | PRN
  Administered 2012-07-13 – 2012-07-15 (×6): 300 mg via ORAL
  Filled 2012-07-13 (×8): qty 1

## 2012-07-13 MED ORDER — INSULIN ASPART 100 UNIT/ML ~~LOC~~ SOLN
0.0000 [IU] | Freq: Three times a day (TID) | SUBCUTANEOUS | Status: DC
  Administered 2012-07-16: 2 [IU] via SUBCUTANEOUS
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
## 2012-07-13 NOTE — ED Notes (Addendum)
Pt states that he is having "pain all over" when asked to specify pt states his head, knees and feet.  Pt reports taking all kinds of pain meds and needs them.  Heavy smeel of ETOH, and pt was having trouble ambulating to room with the assistance of EMS

## 2012-07-13 NOTE — ED Provider Notes (Signed)
Medical screening examination/treatment/procedure(s) were conducted as a shared visit with non-physician practitioner(s) and myself.  I personally evaluated the patient during the encounter  Doug Sou, MD 07/13/12 1623

## 2012-07-13 NOTE — ED Notes (Signed)
Pt knows that urine is needed. Pt unable to void at this time
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
## 2012-07-14 LAB — COMPREHENSIVE METABOLIC PANEL
ALT: 383 U/L — ABNORMAL HIGH (ref 0–53)
AST: 151 U/L — ABNORMAL HIGH (ref 0–37)
Albumin: 3.4 g/dL — ABNORMAL LOW (ref 3.5–5.2)
Alkaline Phosphatase: 63 U/L (ref 39–117)
BUN: 7 mg/dL (ref 6–23)
CO2: 28 mEq/L (ref 19–32)
Calcium: 9.1 mg/dL (ref 8.4–10.5)
Chloride: 103 mEq/L (ref 96–112)
Creatinine, Ser: 0.88 mg/dL (ref 0.50–1.35)
GFR calc Af Amer: >90 mL/min (ref >90)
GFR calc non Af Amer: >90 mL/min (ref >90)
Glucose, Bld: 127 mg/dL — ABNORMAL HIGH (ref 70–99)
Potassium: 3.1 mEq/L — ABNORMAL LOW (ref 3.5–5.1)
Sodium: 140 mEq/L (ref 135–145)
Total Bilirubin: 0.3 mg/dL (ref 0.3–1.2)
Total Protein: 6.6 g/dL (ref 6.0–8.3)

## 2012-07-14 LAB — GLUCOSE, CAPILLARY
Glucose-Capillary: 117 mg/dL — ABNORMAL HIGH (ref 70–99)
Glucose-Capillary: 130 mg/dL — ABNORMAL HIGH (ref 70–99)
Glucose-Capillary: 113 mg/dL — ABNORMAL HIGH (ref 70–99)
Glucose-Capillary: 107 mg/dL — ABNORMAL HIGH (ref 70–99)
Glucose-Capillary: 119 mg/dL — ABNORMAL HIGH (ref 70–99)
Glucose-Capillary: 116 mg/dL — ABNORMAL HIGH (ref 70–99)

## 2012-07-14 LAB — HEPATITIS PANEL, ACUTE
HCV Ab: REACTIVE — AB
Hep A IgM: NEGATIVE
Hep B C IgM: NEGATIVE
Hepatitis B Surface Ag: NEGATIVE

## 2012-07-14 LAB — HEMOGLOBIN A1C
Hgb A1c MFr Bld: 6.1 % — ABNORMAL HIGH (ref <5.7)
Mean Plasma Glucose: 128 mg/dL — ABNORMAL HIGH (ref <117)

## 2012-07-14 LAB — PHOSPHORUS: Phosphorus: 2.6 mg/dL (ref 2.3–4.6)

## 2012-07-14 LAB — MAGNESIUM: Magnesium: 1.8 mg/dL (ref 1.5–2.5)

## 2012-07-14 MED ORDER — POTASSIUM CHLORIDE CRYS ER 20 MEQ PO TBCR
40.0000 meq | EXTENDED_RELEASE_TABLET | ORAL | Status: AC
  Administered 2012-07-14: 40 meq via ORAL
  Filled 2012-07-14: qty 2

## 2012-07-14 MED ORDER — OXYCODONE HCL 5 MG PO TABS
5.0000 mg | ORAL_TABLET | Freq: Once | ORAL | Status: AC
  Administered 2012-07-14: 5 mg via ORAL
  Filled 2012-07-14: qty 1

## 2012-07-14 MED ORDER — CARBAMAZEPINE ER 200 MG PO TB12
200.0000 mg | ORAL_TABLET | Freq: Two times a day (BID) | ORAL | Status: DC
  Administered 2012-07-14 – 2012-07-16 (×4): 200 mg via ORAL
  Filled 2012-07-14 (×5): qty 1

## 2012-07-14 MED ORDER — MAGNESIUM SULFATE 40 MG/ML IJ SOLN
2.0000 g | Freq: Once | INTRAMUSCULAR | Status: AC
  Administered 2012-07-14: 2 g via INTRAVENOUS
  Filled 2012-07-14 (×2): qty 50

## 2012-07-14 MED ORDER — CLOTRIMAZOLE 1 % EX CREA
TOPICAL_CREAM | Freq: Two times a day (BID) | CUTANEOUS | Status: DC
  Administered 2012-07-14 – 2012-07-16 (×4): via TOPICAL
  Filled 2012-07-14: qty 15

## 2012-07-14 NOTE — Progress Notes (Signed)
Utilization Review Completed.   Morghan Kester, RN, BSN Nurse Case Manager  [phone]  
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
## 2012-07-15 LAB — BASIC METABOLIC PANEL
BUN: 8 mg/dL (ref 6–23)
CO2: 24 mEq/L (ref 19–32)
Calcium: 8.8 mg/dL (ref 8.4–10.5)
Chloride: 105 mEq/L (ref 96–112)
Creatinine, Ser: 0.81 mg/dL (ref 0.50–1.35)
GFR calc Af Amer: >90 mL/min (ref >90)
GFR calc non Af Amer: >90 mL/min (ref >90)
Glucose, Bld: 106 mg/dL — ABNORMAL HIGH (ref 70–99)
Potassium: 3.5 mEq/L (ref 3.5–5.1)
Sodium: 139 mEq/L (ref 135–145)

## 2012-07-15 LAB — GLUCOSE, CAPILLARY
Glucose-Capillary: 105 mg/dL — ABNORMAL HIGH (ref 70–99)
Glucose-Capillary: 110 mg/dL — ABNORMAL HIGH (ref 70–99)
Glucose-Capillary: 155 mg/dL — ABNORMAL HIGH (ref 70–99)

## 2012-07-15 LAB — HIV ANTIBODY (ROUTINE TESTING W REFLEX): HIV: NONREACTIVE

## 2012-07-15 MED ORDER — GABAPENTIN 300 MG PO CAPS: 300.0000 mg | ORAL_CAPSULE | Freq: Three times a day (TID) | ORAL | Status: DC | PRN

## 2012-07-15 MED ORDER — CARBAMAZEPINE ER 200 MG PO TB12: 200.0000 mg | ORAL_TABLET | Freq: Two times a day (BID) | ORAL | Status: DC
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
## 2012-07-16 ENCOUNTER — Encounter (HOSPITAL_COMMUNITY): Payer: Self-pay | Admitting: Emergency Medicine

## 2012-07-16 ENCOUNTER — Emergency Department (HOSPITAL_COMMUNITY)
Admission: EM | Admit: 2012-07-16 | Discharge: 2012-07-17 | Disposition: A | Discharge status: Home / Self Care | Payer: PRIVATE HEALTH INSURANCE | Attending: Emergency Medicine | Admitting: Emergency Medicine

## 2012-07-16 DIAGNOSIS — F1410 Cocaine abuse, uncomplicated: Secondary | ICD-10-CM | POA: Insufficient documentation

## 2012-07-16 DIAGNOSIS — F319 Bipolar disorder, unspecified: Secondary | ICD-10-CM | POA: Insufficient documentation

## 2012-07-16 DIAGNOSIS — Z8719 Personal history of other diseases of the digestive system: Secondary | ICD-10-CM | POA: Insufficient documentation

## 2012-07-16 DIAGNOSIS — R6883 Chills (without fever): Secondary | ICD-10-CM | POA: Insufficient documentation

## 2012-07-16 DIAGNOSIS — E119 Type 2 diabetes mellitus without complications: Secondary | ICD-10-CM | POA: Insufficient documentation

## 2012-07-16 DIAGNOSIS — Z87891 Personal history of nicotine dependence: Secondary | ICD-10-CM | POA: Insufficient documentation

## 2012-07-16 DIAGNOSIS — Z862 Personal history of diseases of the blood and blood-forming organs and certain disorders involving the immune mechanism: Secondary | ICD-10-CM | POA: Insufficient documentation

## 2012-07-16 DIAGNOSIS — Z8619 Personal history of other infectious and parasitic diseases: Secondary | ICD-10-CM | POA: Insufficient documentation

## 2012-07-16 DIAGNOSIS — R45851 Suicidal ideations: Secondary | ICD-10-CM | POA: Insufficient documentation

## 2012-07-16 DIAGNOSIS — F1910 Other psychoactive substance abuse, uncomplicated: Secondary | ICD-10-CM | POA: Insufficient documentation

## 2012-07-16 DIAGNOSIS — Z8639 Personal history of other endocrine, nutritional and metabolic disease: Secondary | ICD-10-CM | POA: Insufficient documentation

## 2012-07-16 DIAGNOSIS — Z8679 Personal history of other diseases of the circulatory system: Secondary | ICD-10-CM | POA: Insufficient documentation

## 2012-07-16 DIAGNOSIS — F411 Generalized anxiety disorder: Secondary | ICD-10-CM | POA: Insufficient documentation

## 2012-07-16 DIAGNOSIS — I10 Essential (primary) hypertension: Secondary | ICD-10-CM | POA: Insufficient documentation

## 2012-07-16 DIAGNOSIS — IMO0002 Reserved for concepts with insufficient information to code with codable children: Secondary | ICD-10-CM | POA: Insufficient documentation

## 2012-07-16 DIAGNOSIS — M549 Dorsalgia, unspecified: Secondary | ICD-10-CM | POA: Insufficient documentation

## 2012-07-16 DIAGNOSIS — Z8739 Personal history of other diseases of the musculoskeletal system and connective tissue: Secondary | ICD-10-CM | POA: Insufficient documentation

## 2012-07-16 DIAGNOSIS — R51 Headache: Secondary | ICD-10-CM | POA: Insufficient documentation

## 2012-07-16 DIAGNOSIS — G479 Sleep disorder, unspecified: Secondary | ICD-10-CM | POA: Insufficient documentation

## 2012-07-16 LAB — COMPREHENSIVE METABOLIC PANEL
ALT: 243 U/L — ABNORMAL HIGH (ref 0–53)
AST: 60 U/L — ABNORMAL HIGH (ref 0–37)
Albumin: 4.3 g/dL (ref 3.5–5.2)
Alkaline Phosphatase: 76 U/L (ref 39–117)
BUN: 7 mg/dL (ref 6–23)
CO2: 25 mEq/L (ref 19–32)
Calcium: 9.8 mg/dL (ref 8.4–10.5)
Chloride: 103 mEq/L (ref 96–112)
Creatinine, Ser: 0.8 mg/dL (ref 0.50–1.35)
GFR calc Af Amer: >90 mL/min (ref >90)
GFR calc non Af Amer: >90 mL/min (ref >90)
Glucose, Bld: 99 mg/dL (ref 70–99)
Potassium: 4.4 mEq/L (ref 3.5–5.1)
Sodium: 138 mEq/L (ref 135–145)
Total Bilirubin: 0.3 mg/dL (ref 0.3–1.2)
Total Protein: 8.6 g/dL — ABNORMAL HIGH (ref 6.0–8.3)

## 2012-07-16 LAB — CBC
HCT: 49.6 % (ref 39.0–52.0)
Hemoglobin: 16.8 g/dL (ref 13.0–17.0)
MCH: 28.4 pg (ref 26.0–34.0)
MCHC: 33.9 g/dL (ref 30.0–36.0)
MCV: 83.8 fL (ref 78.0–100.0)
Platelets: 332 10*3/uL (ref 150–400)
RBC: 5.92 MIL/uL — ABNORMAL HIGH (ref 4.22–5.81)
RDW: 15 % (ref 11.5–15.5)
WBC: 9.3 10*3/uL (ref 4.0–10.5)

## 2012-07-16 LAB — RAPID URINE DRUG SCREEN, HOSP PERFORMED
Amphetamines: NOT DETECTED
Barbiturates: NOT DETECTED
Benzodiazepines: NOT DETECTED
Cocaine: POSITIVE — AB
Opiates: NOT DETECTED
Tetrahydrocannabinol: POSITIVE — AB

## 2012-07-16 LAB — SALICYLATE LEVEL: Salicylate Lvl: <2 mg/dL — ABNORMAL LOW (ref 2.8–20.0)

## 2012-07-16 LAB — ACETAMINOPHEN LEVEL: Acetaminophen (Tylenol), Serum: <15 ug/mL (ref 10–30)

## 2012-07-16 LAB — ETHANOL: Alcohol, Ethyl (B): <11 mg/dL (ref 0–11)

## 2012-07-16 LAB — GLUCOSE, CAPILLARY
Glucose-Capillary: 188 mg/dL — ABNORMAL HIGH (ref 70–99)
Glucose-Capillary: 106 mg/dL — ABNORMAL HIGH (ref 70–99)

## 2012-07-16 MED ORDER — GABAPENTIN 300 MG PO CAPS: 300.0000 mg | ORAL_CAPSULE | Freq: Three times a day (TID) | ORAL | Status: DC | PRN

## 2012-07-16 NOTE — Progress Notes (Signed)
Patient discharged.  Planning to take bus to [facility].  Bus pass and bus fare given to patient by SW.  Discharge teaching completed.  Patient refused to take prescriptions, states he does not need the medicine.  Encouraged patient to follow up at [facility] for scheduled appointment.  Verbalizes no questions about discharge instructions.  Vital signs stable.  Ambulating with cane, gate is steady.
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
## 2012-07-16 NOTE — Clinical Social Work Note (Signed)
Clinical Social Worker provided bus pass and $2.40 for [employer] bus pass for patient to get to [place]. Patient reported that he plans to go to [facility] in [place].   Rozetta Nunnery MSW, [employer] [phone]
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
## 2012-07-16 NOTE — ED Notes (Addendum)
Pt states that he is suicidal with a plan.  States that he will kill himself if he leaves here.  Will not disclose plan to this Clinical research associate.  Also states HI.  Pt states he wants to kill the person at [address] [facility] [address]  Pt has arthritis and walks with cane.

## 2012-07-16 NOTE — ED Notes (Signed)
Pt given [place] sandwich and a cup of water.
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
## 2012-07-17 ENCOUNTER — Emergency Department (HOSPITAL_COMMUNITY)
Admission: EM | Admit: 2012-07-17 | Discharge: 2012-07-18 | Disposition: A | Discharge status: Other Acute Inpatient Hospital | Payer: PRIVATE HEALTH INSURANCE | Attending: Emergency Medicine | Admitting: Emergency Medicine

## 2012-07-17 ENCOUNTER — Encounter (HOSPITAL_COMMUNITY): Payer: Self-pay | Admitting: *Deleted

## 2012-07-17 ENCOUNTER — Encounter (HOSPITAL_COMMUNITY): Payer: Self-pay | Admitting: Family Medicine

## 2012-07-17 DIAGNOSIS — Z8679 Personal history of other diseases of the circulatory system: Secondary | ICD-10-CM | POA: Insufficient documentation

## 2012-07-17 DIAGNOSIS — Z8639 Personal history of other endocrine, nutritional and metabolic disease: Secondary | ICD-10-CM | POA: Insufficient documentation

## 2012-07-17 DIAGNOSIS — F329 Major depressive disorder, single episode, unspecified: Secondary | ICD-10-CM | POA: Insufficient documentation

## 2012-07-17 DIAGNOSIS — Z8719 Personal history of other diseases of the digestive system: Secondary | ICD-10-CM | POA: Insufficient documentation

## 2012-07-17 DIAGNOSIS — Z8739 Personal history of other diseases of the musculoskeletal system and connective tissue: Secondary | ICD-10-CM | POA: Insufficient documentation

## 2012-07-17 DIAGNOSIS — F3289 Other specified depressive episodes: Secondary | ICD-10-CM | POA: Insufficient documentation

## 2012-07-17 DIAGNOSIS — I10 Essential (primary) hypertension: Secondary | ICD-10-CM | POA: Insufficient documentation

## 2012-07-17 DIAGNOSIS — R45851 Suicidal ideations: Secondary | ICD-10-CM | POA: Insufficient documentation

## 2012-07-17 DIAGNOSIS — Z862 Personal history of diseases of the blood and blood-forming organs and certain disorders involving the immune mechanism: Secondary | ICD-10-CM | POA: Insufficient documentation

## 2012-07-17 DIAGNOSIS — E119 Type 2 diabetes mellitus without complications: Secondary | ICD-10-CM | POA: Insufficient documentation

## 2012-07-17 DIAGNOSIS — F1010 Alcohol abuse, uncomplicated: Secondary | ICD-10-CM | POA: Insufficient documentation

## 2012-07-17 DIAGNOSIS — Z8659 Personal history of other mental and behavioral disorders: Secondary | ICD-10-CM | POA: Insufficient documentation

## 2012-07-17 DIAGNOSIS — F1410 Cocaine abuse, uncomplicated: Secondary | ICD-10-CM | POA: Insufficient documentation

## 2012-07-17 DIAGNOSIS — Z87891 Personal history of nicotine dependence: Secondary | ICD-10-CM | POA: Insufficient documentation

## 2012-07-17 DIAGNOSIS — Z8619 Personal history of other infectious and parasitic diseases: Secondary | ICD-10-CM | POA: Insufficient documentation

## 2012-07-17 LAB — CBC WITH DIFFERENTIAL/PLATELET
Basophils Absolute: 0 10*3/uL (ref 0.0–0.1)
Basophils Relative: 1 % (ref 0–1)
Eosinophils Absolute: 0.4 10*3/uL (ref 0.0–0.7)
Eosinophils Relative: 4 % (ref 0–5)
HCT: 41.7 % (ref 39.0–52.0)
Hemoglobin: 14.5 g/dL (ref 13.0–17.0)
Lymphocytes Relative: 23 % (ref 12–46)
Lymphs Abs: 2 10*3/uL (ref 0.7–4.0)
MCH: 28 pg (ref 26.0–34.0)
MCHC: 34.8 g/dL (ref 30.0–36.0)
MCV: 80.5 fL (ref 78.0–100.0)
Monocytes Absolute: 0.5 10*3/uL (ref 0.1–1.0)
Monocytes Relative: 6 % (ref 3–12)
Neutro Abs: 5.8 10*3/uL (ref 1.7–7.7)
Neutrophils Relative %: 67 % (ref 43–77)
Platelets: 266 10*3/uL (ref 150–400)
RBC: 5.18 MIL/uL (ref 4.22–5.81)
RDW: 14.9 % (ref 11.5–15.5)
WBC: 8.6 10*3/uL (ref 4.0–10.5)

## 2012-07-17 LAB — SALICYLATE LEVEL: Salicylate Lvl: <2 mg/dL — ABNORMAL LOW (ref 2.8–20.0)

## 2012-07-17 LAB — BASIC METABOLIC PANEL
BUN: 10 mg/dL (ref 6–23)
CO2: 28 mEq/L (ref 19–32)
Calcium: 9.3 mg/dL (ref 8.4–10.5)
Chloride: 104 mEq/L (ref 96–112)
Creatinine, Ser: 0.96 mg/dL (ref 0.50–1.35)
GFR calc Af Amer: >90 mL/min (ref >90)
GFR calc non Af Amer: >90 mL/min (ref >90)
Glucose, Bld: 96 mg/dL (ref 70–99)
Potassium: 3.6 mEq/L (ref 3.5–5.1)
Sodium: 140 mEq/L (ref 135–145)

## 2012-07-17 LAB — ETHANOL: Alcohol, Ethyl (B): <11 mg/dL (ref 0–11)

## 2012-07-17 LAB — RAPID URINE DRUG SCREEN, HOSP PERFORMED
Amphetamines: NOT DETECTED
Barbiturates: NOT DETECTED
Benzodiazepines: NOT DETECTED
Cocaine: POSITIVE — AB
Opiates: NOT DETECTED
Tetrahydrocannabinol: POSITIVE — AB

## 2012-07-17 LAB — ACETAMINOPHEN LEVEL: Acetaminophen (Tylenol), Serum: <15 ug/mL (ref 10–30)

## 2012-07-17 MED ORDER — ONDANSETRON HCL 4 MG PO TABS: 4.0000 mg | ORAL_TABLET | Freq: Three times a day (TID) | ORAL | Status: DC | PRN

## 2012-07-17 MED ORDER — HYDROXYZINE HCL 25 MG PO TABS: 25.0000 mg | ORAL_TABLET | Freq: Every evening | ORAL | Status: DC | PRN

## 2012-07-17 MED ORDER — MIRTAZAPINE 15 MG PO TABS
15.0000 mg | ORAL_TABLET | Freq: Every day | ORAL | Status: DC
  Filled 2012-07-17: qty 1

## 2012-07-17 MED ORDER — MIRTAZAPINE 15 MG PO TABS
15.0000 mg | ORAL_TABLET | Freq: Every day | ORAL | Status: DC
  Administered 2012-07-17: 15 mg via ORAL
  Filled 2012-07-17: qty 1

## 2012-07-17 MED ORDER — ARIPIPRAZOLE 10 MG PO TABS: 10.0000 mg | ORAL_TABLET | Freq: Every day | ORAL | Status: DC

## 2012-07-17 MED ORDER — CHLORDIAZEPOXIDE HCL 25 MG PO CAPS: 25.0000 mg | ORAL_CAPSULE | Freq: Four times a day (QID) | ORAL | Status: DC | PRN

## 2012-07-17 MED ORDER — IBUPROFEN 600 MG PO TABS: 600.0000 mg | ORAL_TABLET | Freq: Four times a day (QID) | ORAL | Status: DC | PRN

## 2012-07-17 MED ORDER — NICOTINE 21 MG/24HR TD PT24: 21.0000 mg | MEDICATED_PATCH | Freq: Every day | TRANSDERMAL | Status: DC

## 2012-07-17 MED ORDER — ACETAMINOPHEN 325 MG PO TABS: 650.0000 mg | ORAL_TABLET | ORAL | Status: DC | PRN

## 2012-07-17 MED ORDER — ZOLPIDEM TARTRATE 5 MG PO TABS
5.0000 mg | ORAL_TABLET | Freq: Every evening | ORAL | Status: DC | PRN
  Administered 2012-07-17: 5 mg via ORAL
  Filled 2012-07-17: qty 1

## 2012-07-17 MED ORDER — ALUM & MAG HYDROXIDE-SIMETH 200-200-20 MG/5ML PO SUSP: 30.0000 mL | ORAL | Status: DC | PRN

## 2012-07-17 MED ORDER — MAGNESIUM HYDROXIDE 400 MG/5ML PO SUSP: 30.0000 mL | Freq: Every day | ORAL | Status: DC | PRN

## 2012-07-17 MED ORDER — IBUPROFEN 200 MG PO TABS
600.0000 mg | ORAL_TABLET | Freq: Three times a day (TID) | ORAL | Status: DC | PRN
  Administered 2012-07-17: 600 mg via ORAL
  Filled 2012-07-17: qty 3

## 2012-07-17 MED ORDER — ARIPIPRAZOLE 10 MG PO TABS
10.0000 mg | ORAL_TABLET | Freq: Every day | ORAL | Status: DC
  Administered 2012-07-17: 10 mg via ORAL
  Filled 2012-07-17: qty 1

## 2012-07-17 MED ORDER — LORAZEPAM 1 MG PO TABS
1.0000 mg | ORAL_TABLET | Freq: Three times a day (TID) | ORAL | Status: DC | PRN
  Administered 2012-07-17: 1 mg via ORAL
  Filled 2012-07-17: qty 1
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
## 2012-07-17 NOTE — ED Notes (Signed)
MD at bedside. 

## 2012-07-17 NOTE — ED Provider Notes (Signed)
See prior note   Ward Givens, MD 07/17/12 [phone]
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
## 2012-07-17 NOTE — ED Notes (Signed)
Pt wanded at this time and belongings removed from room

## 2012-07-17 NOTE — BH Assessment (Signed)
BHH Assessment Progress Note      Pt presented to [facility] after discharge from [facility] requesting inpatient treatment for same.  Dr. Hyacinth Meeker suggested a Telepsych consult, and it was determined that pt would benefit from inpatient treatment.  Writer contacted [facility] to review for inpatient treatment and faxed over Telepsych results.  Awaiting disposition.

## 2012-07-17 NOTE — ED Notes (Signed)
MD via telepsych at bedside.

## 2012-07-17 NOTE — ED Notes (Signed)
Patient received meal tray and ate.

## 2012-07-17 NOTE — ED Notes (Signed)
Per pt sts he has been having suicidal thoughts. sts recently discharged from there. sts he has thoughts of jumping out in front of a car

## 2012-07-17 NOTE — ED Notes (Signed)
telepsych completed 

## 2012-07-17 NOTE — BHH Counselor (Signed)
Tamera Punt, assessment counselor at [facility], submitted [name] for admission to [facility]. Thurman Coyer, [facility] confirmed bed availability. Maryjean Morn, PA reviewed clinical information and accepted [name] to the service of Dr. Geoffery Lyons, room 306-1.  Harlin Rain Patsy Baltimore, LPC, [name] Assessment Counselor

## 2012-07-17 NOTE — ED Notes (Signed)
Pt ambulated down the hall with no assistance. Pt cussing at this nurse and yelling that he wants pain medication or he wants to leave.  Pt would not be redirected back to his room security had to be called. Pt in room with ACT team now

## 2012-07-17 NOTE — ED Notes (Signed)
Pt asleep, sitter at bedside

## 2012-07-17 NOTE — ED Notes (Signed)
telepsych papers were faxed.

## 2012-07-17 NOTE — ED Notes (Signed)
Patient is having telepsych conference with MD at this time.

## 2012-07-17 NOTE — ED Provider Notes (Signed)
History     CSN: [id number]  Arrival date & time 07/16/12  1749   First MD Initiated Contact with Patient 07/16/12 1821      Chief Complaint  Patient presents with  . Medical Clearance    (Consider location/radiation/quality/duration/timing/severity/associated sxs/prior treatment) HPI  Pt is a 52 yo M PMHx significant for Anxiety, Bipolar, Depression, ETOH, and cocaine abuse presents with suicidal and homicidal ideations. Pt states he has a plan to kill himself, but refuses to answer how. Pt states he has a plan and a person he wishes to harm, but also refuses to answer how. Refuses to give history of any previously attempted suicide attempts. No auditory/visual hallucinations. Pt states recent cocaine and marijuana use w/in the last 24 hours. Pt denies fevers, chills, nausea, vomiting.   Past Medical History  Diagnosis Date  . Arthritis   . Hypertension   . Anxiety   . Bipolar affective disorder   . Depression   . Alcohol abuse   . Cocaine abuse     smokes crack-cocaine  . GERD (gastroesophageal reflux disease)   . Chronic back pain   . Tobacco abuse   . Hepatitis C   . Vitamin D deficiency   . Gout   . Hemorrhoids   . Diabetes mellitus without complication     No past surgical history on file.  Family History  Problem Relation Age of Onset  . Hypertension Brother   . Diabetes Brother   . Hypertension Mother   . Diabetes Mother   . Arthritis Mother   . Other Sister     bone disease    History  Substance Use Topics  . Smoking status: Former Smoker    Quit date: 11/23/2011  . Smokeless tobacco: Not on file  . Alcohol Use: No      Review of Systems  Constitutional: Positive for chills.  Eyes: Negative.   Respiratory: Negative.   Cardiovascular: Negative.   Gastrointestinal: Negative.   Genitourinary: Negative.   Musculoskeletal: Positive for back pain.  Skin: Negative.   Neurological: Positive for headaches.  Psychiatric/Behavioral: Positive for  suicidal ideas, sleep disturbance and agitation. Negative for hallucinations. The patient is nervous/anxious.     Allergies  Review of patient's allergies indicates no known allergies.  Home Medications   Current Outpatient Rx  Name  Route  Sig  Dispense  Refill  . gabapentin (NEURONTIN) 300 MG capsule   Oral   Take 300 mg by mouth 3 (three) times daily as needed (pain in feet or burning).           BP 107/48  Pulse 92  Temp(Src) 98.1 F (36.7 C) (Oral)  Resp 20  SpO2 97%  Physical Exam  Constitutional: He is oriented to person, place, and time. He appears well-developed and well-nourished. No distress.  HENT:  Head: Normocephalic and atraumatic.  Eyes: Conjunctivae are normal.  Neck: Normal range of motion. Neck supple.  Cardiovascular: Normal rate, regular rhythm and normal heart sounds.   Pulmonary/Chest: Effort normal and breath sounds normal.  Abdominal: Soft.  Neurological: He is alert and oriented to person, place, and time.  Skin: Skin is warm and dry. He is not diaphoretic.  Psychiatric: His speech is normal. His mood appears anxious. His affect is angry. He expresses homicidal and suicidal ideation. He expresses suicidal plans and homicidal plans.    ED Course  Procedures (including critical care time)  Telepsych consulted.  Psych holding orders placed.  Labs Reviewed  CBC -  Abnormal; Notable for the following:    RBC 5.92 (*)    All other components within normal limits  COMPREHENSIVE METABOLIC PANEL - Abnormal; Notable for the following:    Total Protein 8.6 (*)    AST 60 (*)    ALT 243 (*)    All other components within normal limits  SALICYLATE LEVEL - Abnormal; Notable for the following:    Salicylate Lvl <2.0 (*)    All other components within normal limits  URINE RAPID DRUG SCREEN (HOSP PERFORMED) - Abnormal; Notable for the following:    Cocaine POSITIVE (*)    Tetrahydrocannabinol POSITIVE (*)    All other components within normal limits   ACETAMINOPHEN LEVEL  ETHANOL   No results found.   1. Polysubstance abuse       MDM  Pt is a 52 yo M PMHx significant for Anxiety, Bipolar, Depression, ETOH, and cocaine abuse presents with suicidal and homicidal ideations. Pt states he has a plan to kill himself, but refuses to answer how. Pt states he has a plan and a person he wishes to harm, but also refuses to answer how. Labs showed cocaine and marijuana in patient's system. Rest of labs were unremarkable. No concerning findings on PE. Patient medically cleared and sent back to [facility] Acute Care pod to await Telepsych consultation for SI/HI ideations. Psych hold orders placed. Patient care transferred to Dr. Dierdre Highman at the time of shift change. Patient medically stable at time of shift change.         Lise Auer Amil Moseman, PA-C 07/17/12 1011

## 2012-07-17 NOTE — ED Provider Notes (Signed)
Psychiatric evaluation completed by Dr. Jacky Kindle. He recommends okay to discharge. He recommends referred patient to alcohol/drug program and community mental health. ACT involved in referrals provided.  Results for orders placed during the hospital encounter of 07/16/12  ACETAMINOPHEN LEVEL      Result Value Range   Acetaminophen (Tylenol), Serum <15.0  10 - 30 ug/mL  CBC      Result Value Range   WBC 9.3  4.0 - 10.5 K/uL   RBC 5.92 (*) 4.22 - 5.81 MIL/uL   Hemoglobin 16.8  13.0 - 17.0 g/dL   HCT 45.4  09.8 - 11.9 %   MCV 83.8  78.0 - 100.0 fL   MCH 28.4  26.0 - 34.0 pg   MCHC 33.9  30.0 - 36.0 g/dL   RDW 14.7  82.9 - 56.2 %   Platelets 332  150 - 400 K/uL  COMPREHENSIVE METABOLIC PANEL      Result Value Range   Sodium 138  135 - 145 mEq/L   Potassium 4.4  3.5 - 5.1 mEq/L   Chloride 103  96 - 112 mEq/L   CO2 25  19 - 32 mEq/L   Glucose, Bld 99  70 - 99 mg/dL   BUN 7  6 - 23 mg/dL   Creatinine, Ser 1.30  0.50 - 1.35 mg/dL   Calcium 9.8  8.4 - 86.5 mg/dL   Total Protein 8.6 (*) 6.0 - 8.3 g/dL   Albumin 4.3  3.5 - 5.2 g/dL   AST 60 (*) 0 - 37 U/L   ALT 243 (*) 0 - 53 U/L   Alkaline Phosphatase 76  39 - 117 U/L   Total Bilirubin 0.3  0.3 - 1.2 mg/dL   GFR calc non Af Amer >90  >90 mL/min   GFR calc Af Amer >90  >90 mL/min  ETHANOL      Result Value Range   Alcohol, Ethyl (B) <11  0 - 11 mg/dL  SALICYLATE LEVEL      Result Value Range   Salicylate Lvl <2.0 (*) 2.8 - 20.0 mg/dL  URINE RAPID DRUG SCREEN (HOSP PERFORMED)      Result Value Range   Opiates NONE DETECTED  NONE DETECTED   Cocaine POSITIVE (*) NONE DETECTED   Benzodiazepines NONE DETECTED  NONE DETECTED   Amphetamines NONE DETECTED  NONE DETECTED   Tetrahydrocannabinol POSITIVE (*) NONE DETECTED   Barbiturates NONE DETECTED  NONE DETECTED       Sunnie Nielsen, MD 07/17/12 [phone]

## 2012-07-17 NOTE — ED Notes (Signed)
telepsych ordered  And faxed

## 2012-07-18 ENCOUNTER — Inpatient Hospital Stay (HOSPITAL_COMMUNITY)
Admission: AD | Admit: 2012-07-18 | Discharge: 2012-07-22 | DRG: 885 | Disposition: A | Discharge status: Home / Self Care | Payer: 59 | Source: Intra-hospital | Attending: Psychiatry | Admitting: Psychiatry

## 2012-07-18 ENCOUNTER — Encounter (HOSPITAL_COMMUNITY): Payer: Self-pay | Admitting: *Deleted

## 2012-07-18 DIAGNOSIS — R45851 Suicidal ideations: Secondary | ICD-10-CM

## 2012-07-18 DIAGNOSIS — F1210 Cannabis abuse, uncomplicated: Secondary | ICD-10-CM | POA: Diagnosis present

## 2012-07-18 DIAGNOSIS — F1010 Alcohol abuse, uncomplicated: Secondary | ICD-10-CM

## 2012-07-18 DIAGNOSIS — F319 Bipolar disorder, unspecified: Principal | ICD-10-CM

## 2012-07-18 DIAGNOSIS — E119 Type 2 diabetes mellitus without complications: Secondary | ICD-10-CM | POA: Diagnosis present

## 2012-07-18 DIAGNOSIS — I10 Essential (primary) hypertension: Secondary | ICD-10-CM | POA: Diagnosis present

## 2012-07-18 DIAGNOSIS — F1410 Cocaine abuse, uncomplicated: Secondary | ICD-10-CM

## 2012-07-18 DIAGNOSIS — Z79899 Other long term (current) drug therapy: Secondary | ICD-10-CM

## 2012-07-18 DIAGNOSIS — F419 Anxiety disorder, unspecified: Secondary | ICD-10-CM

## 2012-07-18 DIAGNOSIS — F10239 Alcohol dependence with withdrawal, unspecified: Secondary | ICD-10-CM

## 2012-07-18 DIAGNOSIS — F1920 Other psychoactive substance dependence, uncomplicated: Secondary | ICD-10-CM

## 2012-07-18 DIAGNOSIS — F10939 Alcohol use, unspecified with withdrawal, unspecified: Secondary | ICD-10-CM

## 2012-07-18 MED ORDER — MUSCLE RUB 10-15 % EX CREA
TOPICAL_CREAM | CUTANEOUS | Status: DC | PRN
  Administered 2012-07-18 – 2012-07-20 (×4): via TOPICAL
  Filled 2012-07-18: qty 85

## 2012-07-18 MED ORDER — TRAZODONE HCL 50 MG PO TABS
50.0000 mg | ORAL_TABLET | Freq: Every evening | ORAL | Status: DC | PRN
  Administered 2012-07-18 – 2012-07-21 (×5): 50 mg via ORAL
  Filled 2012-07-18: qty 28
  Filled 2012-07-18: qty 1
  Filled 2012-07-18: qty 28
  Filled 2012-07-18 (×4): qty 1
  Filled 2012-07-18: qty 28
  Filled 2012-07-18 (×2): qty 1
  Filled 2012-07-18: qty 28
  Filled 2012-07-18 (×6): qty 1

## 2012-07-18 MED ORDER — LISINOPRIL 20 MG PO TABS
40.0000 mg | ORAL_TABLET | Freq: Every day | ORAL | Status: DC
  Administered 2012-07-18 – 2012-07-22 (×5): 40 mg via ORAL
  Filled 2012-07-18: qty 1
  Filled 2012-07-18: qty 2
  Filled 2012-07-18 (×4): qty 1
  Filled 2012-07-18 (×2): qty 8
  Filled 2012-07-18: qty 1

## 2012-07-18 MED ORDER — GABAPENTIN 300 MG PO CAPS
300.0000 mg | ORAL_CAPSULE | Freq: Three times a day (TID) | ORAL | Status: DC
  Administered 2012-07-18 – 2012-07-22 (×13): 300 mg via ORAL
  Filled 2012-07-18 (×2): qty 42
  Filled 2012-07-18 (×2): qty 1
  Filled 2012-07-18: qty 42
  Filled 2012-07-18 (×2): qty 1
  Filled 2012-07-18: qty 42
  Filled 2012-07-18 (×9): qty 1
  Filled 2012-07-18: qty 42
  Filled 2012-07-18 (×3): qty 1
  Filled 2012-07-18: qty 42

## 2012-07-18 MED ORDER — ALUM & MAG HYDROXIDE-SIMETH 200-200-20 MG/5ML PO SUSP: 30.0000 mL | ORAL | Status: DC | PRN

## 2012-07-18 MED ORDER — AMLODIPINE BESYLATE 10 MG PO TABS
10.0000 mg | ORAL_TABLET | Freq: Every day | ORAL | Status: DC
  Administered 2012-07-18 – 2012-07-22 (×5): 10 mg via ORAL
  Filled 2012-07-18: qty 1
  Filled 2012-07-18: qty 4
  Filled 2012-07-18 (×4): qty 1
  Filled 2012-07-18: qty 2
  Filled 2012-07-18: qty 1
  Filled 2012-07-18: qty 4

## 2012-07-18 MED ORDER — IBUPROFEN 600 MG PO TABS
600.0000 mg | ORAL_TABLET | Freq: Four times a day (QID) | ORAL | Status: DC | PRN
  Administered 2012-07-18: 600 mg via ORAL
  Filled 2012-07-18: qty 1

## 2012-07-18 MED ORDER — CLONIDINE HCL 0.1 MG/24HR TD PTWK
0.1000 mg | MEDICATED_PATCH | TRANSDERMAL | Status: DC
  Filled 2012-07-18: qty 1

## 2012-07-18 MED ORDER — CARBAMAZEPINE ER 200 MG PO TB12
200.0000 mg | ORAL_TABLET | Freq: Two times a day (BID) | ORAL | Status: DC
  Administered 2012-07-18 – 2012-07-22 (×9): 200 mg via ORAL
  Filled 2012-07-18 (×3): qty 1
  Filled 2012-07-18: qty 28
  Filled 2012-07-18 (×4): qty 1
  Filled 2012-07-18 (×2): qty 28
  Filled 2012-07-18 (×2): qty 1
  Filled 2012-07-18: qty 28
  Filled 2012-07-18 (×3): qty 1

## 2012-07-18 MED ORDER — PANTOPRAZOLE SODIUM 40 MG PO TBEC
40.0000 mg | DELAYED_RELEASE_TABLET | Freq: Every day | ORAL | Status: DC
  Administered 2012-07-18 – 2012-07-22 (×5): 40 mg via ORAL
  Filled 2012-07-18 (×8): qty 1

## 2012-07-18 MED ORDER — DULOXETINE HCL 30 MG PO CPEP
30.0000 mg | ORAL_CAPSULE | Freq: Every day | ORAL | Status: DC
  Administered 2012-07-18 – 2012-07-22 (×5): 30 mg via ORAL
  Filled 2012-07-18 (×4): qty 1
  Filled 2012-07-18: qty 14
  Filled 2012-07-18 (×3): qty 1
  Filled 2012-07-18: qty 14

## 2012-07-18 MED ORDER — METHOCARBAMOL 500 MG PO TABS
500.0000 mg | ORAL_TABLET | Freq: Four times a day (QID) | ORAL | Status: DC | PRN
  Administered 2012-07-18 – 2012-07-22 (×3): 500 mg via ORAL
  Filled 2012-07-18 (×3): qty 1

## 2012-07-18 MED ORDER — CHLORDIAZEPOXIDE HCL 25 MG PO CAPS
25.0000 mg | ORAL_CAPSULE | Freq: Four times a day (QID) | ORAL | Status: DC | PRN
  Administered 2012-07-19 – 2012-07-22 (×3): 25 mg via ORAL
  Filled 2012-07-18 (×2): qty 1

## 2012-07-18 MED ORDER — NICOTINE 14 MG/24HR TD PT24
14.0000 mg | MEDICATED_PATCH | Freq: Every day | TRANSDERMAL | Status: DC
  Filled 2012-07-18: qty 1

## 2012-07-18 MED ORDER — CLONIDINE HCL 0.1 MG PO TABS
0.1000 mg | ORAL_TABLET | Freq: Once | ORAL | Status: AC
  Administered 2012-07-18: 0.1 mg via ORAL
  Filled 2012-07-18: qty 1

## 2012-07-18 MED ORDER — MAGNESIUM HYDROXIDE 400 MG/5ML PO SUSP: 30.0000 mL | Freq: Every day | ORAL | Status: DC | PRN

## 2012-07-18 MED ORDER — ACETAMINOPHEN 325 MG PO TABS: 650.0000 mg | ORAL_TABLET | Freq: Four times a day (QID) | ORAL | Status: DC | PRN
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
## 2012-07-18 NOTE — [facility] Suicide Risk Assessment (Signed)
Suicide Risk Assessment  Admission Assessment     Nursing information obtained from:  Patient Demographic factors:  Male;Low socioeconomic status;Unemployed Current Mental Status:  Self-harm thoughts Loss Factors:  Decline in physical health;Financial problems / change in socioeconomic status Historical Factors:  Prior suicide attempts;Family history of mental illness or substance abuse Risk Reduction Factors:   (Pt cannot name any)  CLINICAL FACTORS:   Bipolar Disorder:   Depressive phase Alcohol/Substance Abuse/Dependencies  COGNITIVE FEATURES THAT CONTRIBUTE TO RISK:  Closed-mindedness Polarized thinking Thought constriction (tunnel vision)    SUICIDE RISK:   Moderate:  Frequent suicidal ideation with limited intensity, and duration, some specificity in terms of plans, no associated intent, good self-control, limited dysphoria/symptomatology, some risk factors present, and identifiable protective factors, including available and accessible social support.  PLAN OF CARE: Supportive approach/coping skills/relapse prevention                               Pursue detox, mood stability  I certify that inpatient services furnished can reasonably be expected to improve the patient's condition.  Nannette Zill A 07/18/2012, 2:56 PM

## 2012-07-18 NOTE — Progress Notes (Signed)
D:  [name] reports that he did not sleep well and he slept into the morning.  He did not attend morning group.  He complains of pain in his knees and feet which was unrelieved by ibuprofen.  He stated that the Robaxin he received did help his pain and he also applied muscle rub which did help.  He attended some groups throughout the day.  He has some passive SI, but does contract for safety. A:  Safety checks q 15 minutes.  Emotional support provided.  Medications administered as ordered. R:  Safety maintained on unit.

## 2012-07-18 NOTE — ED Provider Notes (Signed)
Medical screening examination/treatment/procedure(s) were performed by non-physician practitioner and as supervising physician I was immediately available for consultation/collaboration.  Loren Racer, MD 07/18/12 [phone]
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
## 2012-07-18 NOTE — Tx Team (Signed)
Initial Interdisciplinary Treatment Plan  PATIENT STRENGTHS: (choose at least two) Average or above average intelligence Capable of independent living General fund of knowledge  PATIENT STRESSORS: Financial difficulties Health problems Medication change or noncompliance Substance abuse   PROBLEM LIST: Problem List/Patient Goals Date to be addressed Date deferred Reason deferred Estimated date of resolution  Depression      Risk for self harm      "I need help finding another place to stay"      Substance abuse      Declining health-difficult to get around                               DISCHARGE CRITERIA:  Ability to meet basic life and health needs Adequate post-discharge living arrangements Improved stabilization in mood, thinking, and/or behavior Motivation to continue treatment in a less acute level of care Safe-care adequate arrangements made Verbal commitment to aftercare and medication compliance Withdrawal symptoms are absent or subacute and managed without 24-hour nursing intervention  PRELIMINARY DISCHARGE PLAN: Attend aftercare/continuing care group Attend PHP/IOP Placement in alternative living arrangements  PATIENT/FAMIILY INVOLVEMENT: This treatment plan has been presented to and reviewed with the patient, [name], and/or family member.  The patient and family have been given the opportunity to ask questions and make suggestions.  Jesus Genera [facility] 07/18/2012, 1:28 AM

## 2012-07-18 NOTE — [facility] Group Notes (Signed)
[facility] LCSW Aftercare Discharge Planning Group Note   07/18/2012  8:45 AM  Participation Quality:  Did Not Attend    Clide Dales

## 2012-07-18 NOTE — [facility] Group Notes (Signed)
[facility] LCSW Group Therapy  07/18/2012 1:15 PM  Type of Therapy:  Group Therapy 1:15 TO 2:30 pm  Participation Level:  Minimal  Participation Quality:  Resistant and Sharing  Affect:  Irritable  Cognitive:  Oriented  Insight:  Distracting and Limited  Engagement in Therapy:  Limited  Modes of Intervention:  Clarification, Discussion, Limit-setting, Socialization and Support  Summary of Progress/Problems: Focus of group processing discussion was on balance in life; the components in life which have a negative influence on balance and the components which make for a more balanced life.  Patient shared that he has many negatives in his live but sees no way to change and sees no reason to even talk about change.  Patient did show insight in recognizing that he is weary of being lonely although he chooses to isolate.   Clide Dales

## 2012-07-18 NOTE — Progress Notes (Signed)
Adult Psychoeducational Group Note  Date:  07/18/2012 Time:  7:01 PM  Group Topic/Focus:  Overcoming Stress:   The focus of this group is to define stress and help patients assess their triggers.  Participation Level:  Active  Participation Quality:  Appropriate  Affect:  Appropriate  Cognitive:  Appropriate  Insight: Appropriate  Engagement in Group:  Engaged  Modes of Intervention:  Discussion, Education and Support  Additional Comments:  Pt reported for group late. Pt actively participated in group by completing "Stress Interview" worksheet with a partner. Group discussed triggers for stress, physical signs of stress, and positive coping skills for dealing with stress.    Reinaldo Raddle K 07/18/2012, 7:01 PM

## 2012-07-18 NOTE — Progress Notes (Signed)
Patient ID: [name], male   DOB: Sep 05, 1960, 52 y.o.   MRN: [id number] D: Pt. Noticeably irritable, but does speak with client. Pt. Reports he has been to groups today, only missed one. A: Writer encouraged pt. To attend group. Pt. Will be monitored by staff q81min for safety. R: Pt. Is safe on the unit. Pt. Does attend karaoke.
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
## 2012-07-19 DIAGNOSIS — F1010 Alcohol abuse, uncomplicated: Secondary | ICD-10-CM

## 2012-07-19 NOTE — Progress Notes (Signed)
Adult Psychoeducational Group Note  Date:  07/19/2012 Time:  5:27 PM  Group Topic/Focus:  Relapse Prevention Planning:   The focus of this group is to define relapse and discuss the need for planning to combat relapse.  Participation Level:  Active  Participation Quality:  Appropriate, Attentive, Sharing and Supportive  Affect:  Appropriate  Cognitive:  Appropriate  Insight: Appropriate and Good  Engagement in Group:  Engaged  Modes of Intervention:  Discussion and Education  Additional Comments:  Pt attended group, engaged in group discussion leading most the discussion and supporting other pts discussion. Pt stated "I feel embarassed because everyone knows my name here. They know when I come in the door who I am and I know them." This Clinical research associate gave support and encouragement, stating "Asking for help is one of the hardest things someone can do." Pt agreed and stated "I understand that but it hurts my ego. Its like I just cant seem to understand what I need to do."   Dalia Heading 07/19/2012, 5:27 PM

## 2012-07-19 NOTE — [facility] Group Notes (Signed)
[facility] LCSW Aftercare Discharge Planning Group Note   07/19/2012 8:45 AM  Participation Quality:  Adequate  Mood/Affect:  Anxious, Depressed and Irritable  Depression Rating:  6-7  Anxiety Rating:  6-7  Thoughts of Suicide:  Yes Will you contract for safety?   Yes  Current AVH:  No  Plan for Discharge/Comments:  Patient wants to go inpatient yet insurance co pay is hardship for his finances.   Transportation Means:  Bus. uncertain  Supports: None currently  [employer], [name]

## 2012-07-19 NOTE — Tx Team (Signed)
Interdisciplinary Treatment Plan Update (Adult)  Date: 07/19/2012  Time Reviewed: 9:51 AM   Progress in Treatment: Attending groups: Yes Participating in groups: Yes Taking medication as prescribed:  Yes Tolerating medication:  Yes Family/Significant othe contact made: No Patient understands diagnosis: Yes Discussing patient identified problems/goals with staff: Yes Medical problems stabilized or resolved:  Yes Denies suicidal/homicidal ideation: No Patient has not harmed self or Others: Ye  New problem(s) identified: None Identified  Discharge Plan or Barriers:  CSW is assessing for appropriate referrals. One alternative may be [facility] as patient has been to several treatment centers in past two years. Cannot return to [facility] in [place]  Additional comments: N/A  Reason for Continuation of Hospitalization: Anxiety Depression Medication stabilization Suicidal ideation Withdrawal symptoms   Estimated length of stay: 3 days  For review of initial/current patient goals, please see plan of care.  Attendees: Patient:    Family:    Physician: Geoffery Lyons  07/19/2012 9:51 AM   Nursing: Robbie Louis, RN  07/19/2012 9:51 AM   Clinical Social Worker Ronda Fairly  07/19/2012 9:51 AM   Other: Georgina Quint, [place] PA student  07/19/2012 9:51 AM   Other: Carney Living, RN  07/19/2012 9:51 AM   Other: Serena Colonel, PA  07/19/2012 9:51 AM   Other:  07/19/2012 9:51 AM   Scribe for Treatment Team:  Carney Bern, LCSWA

## 2012-07-19 NOTE — Progress Notes (Signed)
[facility] MD Progress Note  07/19/2012 1:18 PM [name]  MRN:  [id number]  Subjective: "I have to say that my mood is improving. I'm feeling better today than I was yesterday. Everybody keep asking me if I'm suicidal, and my answer remains the same. I will always be and or feel suicidal. Do I have a plan or intent to hurt myself, no. I have a little hope today. The SW informed me that she is checking out some residential treatment places for me. I would like to go to [facility] and or [facility], the one in [place].  O: Ambulated with a cane.  Diagnosis:   Axis I: Alcohol Abuse and Cocaine abuse, Bipolar affective disorder Axis II: Deferred Axis III:  Past Medical History  Diagnosis Date  . Arthritis   . Hypertension   . Anxiety   . Bipolar affective disorder   . Depression   . Alcohol abuse   . Cocaine abuse     smokes crack-cocaine  . GERD (gastroesophageal reflux disease)   . Chronic back pain   . Tobacco abuse   . Hepatitis C   . Vitamin D deficiency   . Gout   . Hemorrhoids   . Diabetes mellitus without complication    Axis IV: other psychosocial or environmental problems and Substance abuse issues Axis V: 41-50 serious symptoms  ADL's:  Fair  Sleep: Good  Appetite:  Good  Suicidal Ideation:  Plan:  Denies  Intent:  Denies Means:  denies Homicidal Ideation:  Plan:  Denies  Intent:  Denies Means:  denies AEB (as evidenced by):  Psychiatric Specialty Exam: Review of Systems  Constitutional: Negative.   HENT: Negative.   Eyes: Negative.   Respiratory: Negative.   Cardiovascular: Negative.   Gastrointestinal: Negative.   Genitourinary: Negative.   Musculoskeletal: Positive for myalgias, back pain and joint pain.       Ambulates with a cane  Skin: Negative.   Neurological: Negative.   Endo/Heme/Allergies: Negative.   Psychiatric/Behavioral: Positive for depression, suicidal ideas and substance abuse. Negative for hallucinations and memory loss. The  patient is nervous/anxious. The patient does not have insomnia.     Blood pressure 149/101, pulse 111, temperature 97 F (36.1 C), temperature source Oral, resp. rate 20, height 5' 8.5" (1.74 m), weight 108.863 kg (240 lb).Body mass index is 35.96 kg/(m^2).  General Appearance: Casual and Obese  Eye Contact::  Good  Speech:  Clear and Coherent  Volume:  Normal  Mood:  "I'm starting to feel better"  Affect:  Congruent  Thought Process:  Goal Directed and Intact  Orientation:  Full (Time, Place, and Person)  Thought Content:  Rumination  Suicidal Thoughts:  Yes.  without intent/plan  Homicidal Thoughts:  No  Memory:  Immediate;   Good Recent;   Good Remote;   Good  Judgement:  Fair  Insight:  Shallow  Psychomotor Activity:  Normal  Concentration:  Good  Recall:  Good  Akathisia:  No  Handed:  Right  AIMS (if indicated):     Assets:  Desire for Improvement  Sleep:  Number of Hours: 6   Current Medications: Current Facility-Administered Medications  Medication Dose Route Frequency Provider Last Rate Last Dose  . acetaminophen (TYLENOL) tablet 650 mg  650 mg Oral Q6H PRN Kerry Hough, PA-C      . alum & mag hydroxide-simeth (MAALOX/MYLANTA) 200-200-20 MG/5ML suspension 30 mL  30 mL Oral Q4H PRN Kerry Hough, PA-C      .  amLODipine (NORVASC) tablet 10 mg  10 mg Oral Daily Sanjuana Kava, NP   10 mg at 07/19/12 0805  . carbamazepine (TEGRETOL XR) 12 hr tablet 200 mg  200 mg Oral BID Sanjuana Kava, NP   200 mg at 07/19/12 0805  . chlordiazePOXIDE (LIBRIUM) capsule 25 mg  25 mg Oral QID PRN Kerry Hough, PA-C      . DULoxetine (CYMBALTA) DR capsule 30 mg  30 mg Oral Daily Sanjuana Kava, NP   30 mg at 07/19/12 0805  . gabapentin (NEURONTIN) capsule 300 mg  300 mg Oral TID Sanjuana Kava, NP   300 mg at 07/19/12 1210  . ibuprofen (ADVIL,MOTRIN) tablet 600 mg  600 mg Oral Q6H PRN Kerry Hough, PA-C   600 mg at 07/18/12 1056  . lisinopril (PRINIVIL,ZESTRIL) tablet 40 mg  40 mg  Oral Daily Sanjuana Kava, NP   40 mg at 07/19/12 0805  . magnesium hydroxide (MILK OF MAGNESIA) suspension 30 mL  30 mL Oral Daily PRN Kerry Hough, PA-C      . methocarbamol (ROBAXIN) tablet 500 mg  500 mg Oral Q6H PRN Rachael Fee, MD   500 mg at 07/18/12 1512  . MUSCLE RUB CREA   Topical PRN Rachael Fee, MD      . pantoprazole (PROTONIX) EC tablet 40 mg  40 mg Oral Daily Sanjuana Kava, NP   40 mg at 07/19/12 0805  . traZODone (DESYREL) tablet 50 mg  50 mg Oral QHS,MR X 1 Kerry Hough, PA-C   50 mg at 07/18/12 2223    Lab Results: No results found for this or any previous visit (from the past 48 hour(s)).  Physical Findings: AIMS: Facial and Oral Movements Muscles of Facial Expression: None, normal Lips and Perioral Area: None, normal Jaw: None, normal Tongue: None, normal,Extremity Movements Upper (arms, wrists, hands, fingers): None, normal Lower (legs, knees, ankles, toes): None, normal, Trunk Movements Neck, shoulders, hips: None, normal, Overall Severity Severity of abnormal movements (highest score from questions above): None, normal Incapacitation due to abnormal movements: None, normal Patient's awareness of abnormal movements (rate only patient's report): No Awareness, Dental Status Current problems with teeth and/or dentures?: No Does patient usually wear dentures?: No  CIWA:    COWS:  COWS Total Score: 6  Treatment Plan Summary: Daily contact with patient to assess and evaluate symptoms and progress in treatment Medication management  Plan: Supportive approach/coping skills/relapse prevention. Encouraged out of room, participation in group sessions and application of coping skills when distressed. Will continue to monitor response to/adverse effects of medications in use to assure effectiveness. Continue to monitor mood, behavior and interaction with staff and other patients. Continue current plan of care.  Medical Decision Making Problem Points:   Established problem, stable/improving (1), Review of last therapy session (1) and Review of psycho-social stressors (1) Data Points:  Review and summation of old records (2) Review of medication regiment & side effects (2)  I certify that inpatient services furnished can reasonably be expected to improve the patient's condition.   Armandina Stammer I 07/19/2012, 1:18 PM

## 2012-07-19 NOTE — Progress Notes (Signed)
Patient did attend the evening karaoke group. Pt was engaged, supportive, and participated by singing a song.  

## 2012-07-19 NOTE — Progress Notes (Signed)
Patient ID: [name], male   DOB: [birth year], 52 y.o.   MRN: [id number] D: Patient endorses passive suicidal ideation but contracts for safety. Pt mood/affect was depressed and flat. Pt stated he is ready to get help and has a plan to go to [facility] or [facility] after discharge. Pt stated he is tired of lying to himself and staff and is willing to get help.  Pt attended evening AA group and Interacted appropriately with peers. No s/s withdrawals noted. Cooperative with assessment. No acute distressed noted at this time.   A: Met with pt 1:1. Medications administered as prescribed. Writer encouraged pt to discuss feelings. Pt encouraged to come to staff with any question or concerns. 15 minutes checks for safety.  R: Patient remains safe. She is complaint with medications and group programming. Continue current POC.

## 2012-07-19 NOTE — [facility] Group Notes (Signed)
[facility] LCSW Group Therapy  Relapse Prevention 1:15-2:15 07/19/2012    Type of Therapy: Group Therapy   Participation Level: Appropriate  Participation Quality: Attentive  Affect: Appropriate   Cognitive: Appropriate   Insight: Improving   Engagement in Group: Engaged  Engagement in Therapy: Engaged  Modes of Intervention: Discussion, Education, Socialization and Support   Summary of Progress/Problems:The topic for today was feelings about relapse. Pt discussed what relapse prevention is to them and identified triggers that they are on the path to relapse. Pt processed their feeling towards relapse and was able to relate to peers. Pt discussed coping skills that can be used for relapse prevention. Pt participated in group discussion about putting yourself first in order to take better care of themselves. With this discussion, pt discussed learning to say no to others and self care. Pt discussed goals they had to get to where they want to be.  Pt processed that he admitted to the hospital because he "is tired of doing to the same things."  He shared that he wants to distance himself from negative people who "only want to be his friend when he has crack."  Pt reports that he would like to go to treatment at [facility].

## 2012-07-20 DIAGNOSIS — F1420 Cocaine dependence, uncomplicated: Secondary | ICD-10-CM

## 2012-07-20 DIAGNOSIS — F1020 Alcohol dependence, uncomplicated: Secondary | ICD-10-CM

## 2012-07-20 MED ORDER — FUROSEMIDE 20 MG PO TABS
20.0000 mg | ORAL_TABLET | Freq: Once | ORAL | Status: AC
  Administered 2012-07-20: 20 mg via ORAL
  Filled 2012-07-20 (×2): qty 1

## 2012-07-20 NOTE — Progress Notes (Signed)
Psychoeducational Group Note  Date:  07/20/2012 Time:  0945 am  Group Topic/Focus:  Identifying Needs:   The focus of this group is to help patients identify their personal needs that have been historically problematic and identify healthy behaviors to address their needs.  Participation Level:  Minimal  Participation Quality:  Drowsy  Affect:  Lethargic  Cognitive:  Alert  Insight:  Developing/Improving  Engagement in Group:  Limited  Additional Comments:    Andrena Mews 07/20/2012,2:15 PM

## 2012-07-20 NOTE — Progress Notes (Signed)
D.  Pt pleasant on approach, denies complaints at this time.  Positive for evening group, interacting appropriately within milieu.  Playing cards with peers at this time.  Denies SI/HI/hallucinations at this time, but does endorse passive SI at times during day.  Pt does contract for safety.  A.  Support and encouragement offered  R.  Pt remains safe on unit, will continue to monitor.
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
## 2012-07-20 NOTE — Progress Notes (Signed)
D   Pt attended group but had very little participation  He is appropriate and pleasant and denies any signs and symptoms of withdrawal  He is somatically focused and has multiple medications for different issues A   Verbal support given  medications administered and effectiveness monitored  Q 15 min checks  Provided medication teaching sheets upon pt request R  Pt safe at present

## 2012-07-20 NOTE — Progress Notes (Signed)
Adult Psychoeducational Group Note  Date:  07/20/2012 Time:  0900  Group Topic/Focus:  Self inventory sheet  Participation Level:  Active  Participation Quality:  Appropriate  Affect:  Appropriate  Cognitive:  Appropriate  Insight: Appropriate  Engagement in Group:  Engaged  Modes of Intervention:  Discussion  Additional Comments:  participated  Roselee Culver 07/20/2012, 12:33 PM
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
## 2012-07-21 DIAGNOSIS — F1920 Other psychoactive substance dependence, uncomplicated: Secondary | ICD-10-CM

## 2012-07-21 LAB — COMPREHENSIVE METABOLIC PANEL
ALT: 106 U/L — ABNORMAL HIGH (ref 0–53)
AST: 38 U/L — ABNORMAL HIGH (ref 0–37)
Albumin: 4.1 g/dL (ref 3.5–5.2)
Alkaline Phosphatase: 84 U/L (ref 39–117)
BUN: 10 mg/dL (ref 6–23)
CO2: 25 mEq/L (ref 19–32)
Calcium: 9.6 mg/dL (ref 8.4–10.5)
Chloride: 101 mEq/L (ref 96–112)
Creatinine, Ser: 0.91 mg/dL (ref 0.50–1.35)
GFR calc Af Amer: >90 mL/min (ref >90)
GFR calc non Af Amer: >90 mL/min (ref >90)
Glucose, Bld: 122 mg/dL — ABNORMAL HIGH (ref 70–99)
Potassium: 4.1 mEq/L (ref 3.5–5.1)
Sodium: 137 mEq/L (ref 135–145)
Total Bilirubin: 0.2 mg/dL — ABNORMAL LOW (ref 0.3–1.2)
Total Protein: 7.9 g/dL (ref 6.0–8.3)

## 2012-07-21 LAB — CARBAMAZEPINE LEVEL, TOTAL: Carbamazepine Lvl: 6.1 ug/mL (ref 4.0–12.0)

## 2012-07-21 LAB — CRYOGLOBULIN

## 2012-07-21 MED ORDER — FUROSEMIDE 20 MG PO TABS
20.0000 mg | ORAL_TABLET | Freq: Two times a day (BID) | ORAL | Status: DC
  Administered 2012-07-21 – 2012-07-22 (×2): 20 mg via ORAL
  Filled 2012-07-21: qty 1
  Filled 2012-07-21 (×2): qty 8
  Filled 2012-07-21 (×2): qty 1
  Filled 2012-07-21 (×2): qty 8
  Filled 2012-07-21 (×2): qty 1

## 2012-07-21 MED ORDER — FUROSEMIDE 20 MG PO TABS
20.0000 mg | ORAL_TABLET | Freq: Once | ORAL | Status: AC
  Administered 2012-07-21: 20 mg via ORAL
  Filled 2012-07-21 (×2): qty 1

## 2012-07-21 NOTE — Progress Notes (Signed)
[facility] MD Progress Note  07/21/2012 2:44 PM MITCHELLE GOERNER  MRN:  [id number] Subjective:  Javonni states that the Lasix help some. Would like to have another dosage. States that he is trying to get his life back together. States he will try to go home tomorrow.  Diagnosis:  Bipolar Affective Disorder, Polysubstance Dependence  ADL's:  Intact  Sleep: Fair  Appetite:  Fair  Suicidal Ideation:  Plan:  denies Intent:  denies Means:  denies Homicidal Ideation:  Plan:  denies Intent:  denies Means:  denies AEB (as evidenced by):  Psychiatric Specialty Exam: Review of Systems  Constitutional: Negative.   HENT: Negative.   Eyes: Negative.   Respiratory: Negative.   Cardiovascular: Negative.   Gastrointestinal: Negative.   Genitourinary: Negative.   Musculoskeletal:       Ankle swelling  Skin: Negative.   Neurological: Negative.   Endo/Heme/Allergies: Negative.   Psychiatric/Behavioral: Positive for substance abuse. The patient is nervous/anxious.     Blood pressure 154/90, pulse 107, temperature 98.3 F (36.8 C), temperature source Oral, resp. rate 18, height 5' 8.5" (1.74 m), weight 108.863 kg (240 lb).Body mass index is 35.96 kg/(m^2).  General Appearance: Fairly Groomed  Patent attorney::  Fair  Speech:  Clear and Coherent  Volume:  fluctuates  Mood:  Anxious and worried  Affect:  worried  Thought Process:  Coherent and Goal Directed  Orientation:  Full (Time, Place, and Person)  Thought Content:  worries, concerns, somatically focused  Suicidal Thoughts:  No  Homicidal Thoughts:  No  Memory:  Immediate;   Fair Recent;   Fair Remote;   Fair  Judgement:  Fair  Insight:  Shallow  Psychomotor Activity:  Restlessness  Concentration:  Fair  Recall:  Fair  Akathisia:  No  Handed:  Right  AIMS (if indicated):     Assets:  Desire for Improvement  Sleep:  Number of Hours: 5.25   Current Medications: Current Facility-Administered Medications  Medication Dose Route Frequency  Provider Last Rate Last Dose  . acetaminophen (TYLENOL) tablet 650 mg  650 mg Oral Q6H PRN Kerry Hough, PA-C      . alum & mag hydroxide-simeth (MAALOX/MYLANTA) 200-200-20 MG/5ML suspension 30 mL  30 mL Oral Q4H PRN Kerry Hough, PA-C      . amLODipine (NORVASC) tablet 10 mg  10 mg Oral Daily Sanjuana Kava, NP   10 mg at 07/21/12 0809  . carbamazepine (TEGRETOL XR) 12 hr tablet 200 mg  200 mg Oral BID Sanjuana Kava, NP   200 mg at 07/21/12 0809  . chlordiazePOXIDE (LIBRIUM) capsule 25 mg  25 mg Oral QID PRN Kerry Hough, PA-C   25 mg at 07/20/12 0813  . DULoxetine (CYMBALTA) DR capsule 30 mg  30 mg Oral Daily Sanjuana Kava, NP   30 mg at 07/21/12 0809  . gabapentin (NEURONTIN) capsule 300 mg  300 mg Oral TID Sanjuana Kava, NP   300 mg at 07/21/12 4098  . ibuprofen (ADVIL,MOTRIN) tablet 600 mg  600 mg Oral Q6H PRN Kerry Hough, PA-C   600 mg at 07/18/12 1056  . lisinopril (PRINIVIL,ZESTRIL) tablet 40 mg  40 mg Oral Daily Sanjuana Kava, NP   40 mg at 07/21/12 0809  . magnesium hydroxide (MILK OF MAGNESIA) suspension 30 mL  30 mL Oral Daily PRN Kerry Hough, PA-C      . methocarbamol (ROBAXIN) tablet 500 mg  500 mg Oral Q6H PRN Rachael Fee, MD  500 mg at 07/21/12 0828  . MUSCLE RUB CREA   Topical PRN Rachael Fee, MD      . pantoprazole (PROTONIX) EC tablet 40 mg  40 mg Oral Daily Sanjuana Kava, NP   40 mg at 07/21/12 0809  . traZODone (DESYREL) tablet 50 mg  50 mg Oral QHS,MR X 1 Kerry Hough, PA-C   50 mg at 07/20/12 2230    Lab Results:  Results for orders placed during the hospital encounter of 07/18/12 (from the past 48 hour(s))  CARBAMAZEPINE LEVEL, TOTAL     Status: None   Collection Time    07/21/12  6:34 AM      Result Value Range   Carbamazepine Lvl 6.1  4.0 - 12.0 ug/mL    Physical Findings: AIMS: Facial and Oral Movements Muscles of Facial Expression: None, normal Lips and Perioral Area: None, normal Jaw: None, normal Tongue: None, normal,Extremity  Movements Upper (arms, wrists, hands, fingers): None, normal Lower (legs, knees, ankles, toes): None, normal, Trunk Movements Neck, shoulders, hips: None, normal, Overall Severity Severity of abnormal movements (highest score from questions above): None, normal Incapacitation due to abnormal movements: None, normal Patient's awareness of abnormal movements (rate only patient's report): No Awareness, Dental Status Current problems with teeth and/or dentures?: No Does patient usually wear dentures?: No  CIWA:  CIWA-Ar Total: 0 COWS:  COWS Total Score: 6  Treatment Plan Summary: Daily contact with patient to assess and evaluate symptoms and progress in treatment Medication management  Plan: Supportive approach/coping skills           Lasix 20 mg            Reassess for D/C in AM  Medical Decision Making Problem Points:  Review of psycho-social stressors (1) Data Points:  Review of medication regiment & side effects (2)  I certify that inpatient services furnished can reasonably be expected to improve the patient's condition.   Dimas Scheck A 07/21/2012, 2:44 PM

## 2012-07-21 NOTE — Progress Notes (Signed)
[facility] Group Notes:  (Nursing/MHT/Case Management/Adjunct)  Date:  07/20/2012  Time:  2100  Type of Therapy:  wrap up group  Participation Level:  Active  Participation Quality:  Appropriate, Attentive and Supportive  Affect:  Appropriate  Cognitive:  Alert and Appropriate  Insight:  Appropriate  Engagement in Group:  Engaged  Modes of Intervention:  Clarification, Education and Support  Summary of Progress/Problems:  [name] 07/21/2012, 3:26 AM

## 2012-07-21 NOTE — Progress Notes (Signed)
Adult Psychoeducational Group Note  Date:  07/21/2012 Time:  0900  Group Topic/Focus:  Self inventory sheet  Participation Level:  Active  Participation Quality:  Appropriate  Affect:  Appropriate  Cognitive:  Appropriate  Insight: Appropriate  Engagement in Group:  Engaged  Modes of Intervention:  Discussion    Roselee Culver 07/21/2012, 12:44 PM

## 2012-07-21 NOTE — Progress Notes (Signed)
[facility] Group Notes:  (Nursing/MHT/Case Management/Adjunct)  Date:  07/21/2012  Time:  10:55 AM  Type of Therapy:  Therapeutic Activity  Participation Level:  Active  Participation Quality:  Appropriate, Attentive, Sharing and Supportive  Affect:  Appropriate  Cognitive:  Alert and Appropriate  Insight:  Appropriate and Good  Engagement in Group:  Engaged  Modes of Intervention:  Activity  Summary of Progress/Problems:Pt participated in group where pts played "Would you rather".    Dalia Heading 07/21/2012, 10:55 AM

## 2012-07-21 NOTE — Progress Notes (Signed)
Psychoeducational Group Note  Date:  07/21/2012 Time:  0945 am  Group Topic/Focus:  Making Healthy Choices:   The focus of this group is to help patients identify negative/unhealthy choices they were using prior to admission and identify positive/healthier coping strategies to replace them upon discharge.  Participation Level:  Active  Participation Quality:  Appropriate  Affect:  Appropriate  Cognitive:  Alert  Insight:  Developing/Improving  Engagement in Group:  Improving  Additional Comments:    Andrena Mews 07/21/2012, 10:29 AM

## 2012-07-21 NOTE — Progress Notes (Signed)
D   Pt has been pleasant and appropriate today  He attends groups and participates well   He was irritable earlier this morning but is less so as the day goes on   He interacts well with his peers  He does not complain of withdrawal symptoms at present A   Verbal support given  Medications administered and effectiveness monitored   Q 15 min checks S   Pt safe at present

## 2012-07-21 NOTE — BHH Group Notes (Signed)
[facility] LCSW Group Therapy  07/21/2012 10:00-11:00am  Summary of Progress/Problems:  The main focus of today's process group was to consider whether and how to increase/improve support system.  Four definitions of support were discussed and an exercise was utilized to show how much stronger we become with additional supports providing accountability, improved physical and emotional health, and better problem-solving skills.  An emphasis was placed on using counselor, doctor, therapy groups, 12-step groups, and problem-specific support groups to expand supports. The patient expressed that when he has been in this hospital in the past, he has told people what "they wanted to hear" but this time he is being honest.  He said seeking additional supports was not as easy as just doing it, or just seeking a sponsor, or just going to meetings.  He was resistant as to whether he wants to seek supports, as he is a Futures trader" and feels he needs to rely on himself and he does not trust others.  In contrast to this, he stated his motivation to seek additional supports is 10 out of 10.  CSW pointed out the discrepancy and he said he knows what he needs to do.  Type of Therapy:  Group Therapy  Participation Level:  Active  Participation Quality:  Attentive, Intrusive, Inattentive, Redirectable and Sharing  Affect:  Blunted and Defensive  Cognitive:  Appropriate  Insight:  Developing/Improving  Engagement in Therapy:  Engaged  Modes of Intervention:  Exploration and Motivational Interviewing  Sarina Ser 07/21/2012, 12:46 PM

## 2012-07-21 NOTE — Progress Notes (Signed)
D. Pt has been up and active on the unit, interacting appropriately with peers.  No complaints voiced, denies SI/HI/hallucinations at this time.  Positive for evening AA group.  A.  Support and encouragement offered  R.  Pt remains safe on unit, will continue to monitor.

## 2012-07-22 MED ORDER — FUROSEMIDE 20 MG PO TABS: 20.0000 mg | ORAL_TABLET | Freq: Two times a day (BID) | ORAL | Status: DC

## 2012-07-22 MED ORDER — DULOXETINE HCL 30 MG PO CPEP: 30.0000 mg | ORAL_CAPSULE | Freq: Every day | ORAL | Status: DC

## 2012-07-22 MED ORDER — TRAZODONE HCL 50 MG PO TABS: 50.0000 mg | ORAL_TABLET | Freq: Every evening | ORAL | Status: DC | PRN

## 2012-07-22 MED ORDER — AMLODIPINE BESYLATE 10 MG PO TABS: 10.0000 mg | ORAL_TABLET | Freq: Every day | ORAL | Status: DC

## 2012-07-22 MED ORDER — LISINOPRIL 40 MG PO TABS: 40.0000 mg | ORAL_TABLET | Freq: Every day | ORAL | Status: DC

## 2012-07-22 MED ORDER — GABAPENTIN 300 MG PO CAPS: 300.0000 mg | ORAL_CAPSULE | Freq: Three times a day (TID) | ORAL | Status: DC

## 2012-07-22 MED ORDER — CARBAMAZEPINE ER 200 MG PO TB12: 200.0000 mg | ORAL_TABLET | Freq: Two times a day (BID) | ORAL | Status: DC

## 2012-07-22 NOTE — Progress Notes (Signed)
D/C instructions/meds/follow-up appointments reviewed, pt verbalized understanding, pt's belongings returned to pt, samples given. 

## 2012-07-22 NOTE — BHH Group Notes (Signed)
[facility] LCSW Aftercare Discharge Planning Group Note   07/22/2012 8:45 AM  Participation Quality:   Adequate   Mood/Affect:  Irritable  Depression Rating:  0  Anxiety Rating:  10  Thoughts of Suicide:  No Will you contract for safety?   NA  Current AVH:  No  Plan for Discharge/Comments:  Patient plans to return to [name] in [place] for medication management.  Plans to go to [facility] in [place] and considering option of [facility]  Transportation Means: Bus voucher  Supports: [facility] workers, some family although patient will not sign release for CSW to speak with family  Dyane Dustman, [name]

## 2012-07-22 NOTE — BHH Suicide Risk Assessment (Signed)
Suicide Risk Assessment  Discharge Assessment     Demographic Factors:  Male, Low socioeconomic status and Unemployed  Mental Status Per Nursing Assessment::   On Admission:  Self-harm thoughts  Current Mental Status by Physician: patient denies suicidal ideation, intent or plan  Loss Factors: Decrease in vocational status, Decline in physical health and Financial problems/change in socioeconomic status  Historical Factors: Family history of mental illness or substance abuse and Impulsivity  Risk Reduction Factors:   Sense of responsibility to family and Positive social support  Continued Clinical Symptoms:  Alcohol/Substance Abuse/Dependencies  Cognitive Features That Contribute To Risk:  Closed-mindedness    Suicide Risk:  Minimal: No identifiable suicidal ideation.  Patients presenting with no risk factors but with morbid ruminations; may be classified as minimal risk based on the severity of the depressive symptoms  Discharge Diagnoses:   AXIS I: Bipolar affective disorder             Cocaine and alcohol abuse  AXIS II:  Deferred AXIS III:   Past Medical History  Diagnosis Date  . Arthritis   . Hypertension   . Anxiety   . Bipolar affective disorder   . Depression   . Alcohol abuse   . Cocaine abuse     smokes crack-cocaine  . GERD (gastroesophageal reflux disease)   . Chronic back pain   . Tobacco abuse   . Hepatitis C   . Vitamin D deficiency   . Gout   . Hemorrhoids   . Diabetes mellitus without complication    AXIS IV:  economic problems, other psychosocial or environmental problems and problems related to social environment AXIS V:  61-70 mild symptoms  Plan Of Care/Follow-up recommendations:  Activity:  as tolerated  Diet:  healthy Tests:  routine Other:  patient to keep after care appointment  Is patient on multiple antipsychotic therapies at discharge:  No   Has Patient had three or more failed trials of antipsychotic monotherapy by  history:  No  Recommended Plan for Multiple Antipsychotic Therapies:N/A   Hubbard Seldon,MD 07/22/2012, 11:49 AM

## 2012-07-22 NOTE — Progress Notes (Signed)
[facility] Adult Case Management Discharge Plan :  Will you be returning to the same living situation after discharge: No, Patient going to [facility] in [facility] [place] [facility] [place] At discharge, do you have transportation home?: No, will use bus voucher and PART bus fare to get to [facility] Do you have the ability to pay for your medications: Yes through [name]  Release of information consent forms completed and in the chart;  [name] needed at discharge.  Patient to Follow up at: Follow-up Information   Follow up with [facility]. ([facility] to [facility]up at [facility] within the next [facility] business days)    Contact information:   [address] [place], [place] [address] [phone]      Patient denies SI/HI:   Denies both  Safety Planning and Suicide Prevention discussed: Yes with Patient  [name]

## 2012-07-22 NOTE — Progress Notes (Signed)
Patient ID: ARLYN BUERKLE, male   DOB: [birth year], 52 y.o.   MRN: [id number]  CSW has called multiple times to both [facility] Benefits office and [facility] as patient reports they will arrange transportation for him.  Patient reported they will arrange transportation home for him; yet it appears patient wishes to go to [facility] ED in order to be readmitted; CSW explained to patient there is no criteria for admit as he will be discharged from here today.  Patient insisted CSW contact [facility] transportation once more which has been completed; VA reports same as per Joey in transportation at [phone] Carney Bern, LCSWA
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
## 2012-07-22 NOTE — Progress Notes (Signed)
Patient did attend the evening speaker AA meeting.  

## 2012-07-22 NOTE — BHH Suicide Risk Assessment (Signed)
BHH INPATIENT:  Family/Significant Other Suicide Prevention Education  Suicide Prevention Education:  Patient Refusal for Family/Significant Other Suicide Prevention Education: The patient [name] has refused to provide written consent for family/significant other to be provided Family/Significant Other Suicide Prevention Education during admission and/or prior to discharge.  Physician notified. Writer provided suicide prevention education directly to patient; conversation included risk factors, warning signs and resources to contact for help. Mobile crisis services explained and contact card placed in chart for pt to receive at discharge.  Clide Dales 07/22/2012, 11:55 AM

## 2012-07-22 NOTE — BHH Group Notes (Signed)
[facility] LCSW Group Therapy  07/22/2012  1:15 PM  Type of Therapy:  Group Therapy  Participation Level:  None, patient slept    Clide Dales 07/22/2012, 5:10 PM

## 2012-07-24 NOTE — Discharge Summary (Signed)
Seen and agreed. Shiro Ellerman, MD 

## 2012-07-24 NOTE — Progress Notes (Signed)
Patient Discharge Instructions:  Records sent via mail to: [facility] [address] [place], [place] [address]  Jerelene Redden, 07/24/2012, 2:03 PM

## 2012-10-17 ENCOUNTER — Emergency Department (HOSPITAL_COMMUNITY)
Admission: EM | Admit: 2012-10-17 | Discharge: 2012-10-18 | Disposition: A | Discharge status: Other Acute Inpatient Hospital | Payer: PRIVATE HEALTH INSURANCE | Attending: Emergency Medicine | Admitting: Emergency Medicine

## 2012-10-17 ENCOUNTER — Encounter (HOSPITAL_COMMUNITY): Payer: Self-pay | Admitting: *Deleted

## 2012-10-17 ENCOUNTER — Emergency Department (HOSPITAL_COMMUNITY): Payer: PRIVATE HEALTH INSURANCE

## 2012-10-17 ENCOUNTER — Encounter (HOSPITAL_COMMUNITY): Payer: Self-pay | Admitting: Emergency Medicine

## 2012-10-17 ENCOUNTER — Emergency Department (HOSPITAL_COMMUNITY)
Admission: EM | Admit: 2012-10-17 | Discharge: 2012-10-17 | Disposition: A | Discharge status: Home / Self Care | Payer: PRIVATE HEALTH INSURANCE | Attending: Emergency Medicine | Admitting: Emergency Medicine

## 2012-10-17 DIAGNOSIS — F1410 Cocaine abuse, uncomplicated: Secondary | ICD-10-CM | POA: Insufficient documentation

## 2012-10-17 DIAGNOSIS — R45851 Suicidal ideations: Secondary | ICD-10-CM | POA: Diagnosis not present

## 2012-10-17 DIAGNOSIS — E559 Vitamin D deficiency, unspecified: Secondary | ICD-10-CM | POA: Insufficient documentation

## 2012-10-17 DIAGNOSIS — M129 Arthropathy, unspecified: Secondary | ICD-10-CM | POA: Insufficient documentation

## 2012-10-17 DIAGNOSIS — R0789 Other chest pain: Secondary | ICD-10-CM | POA: Insufficient documentation

## 2012-10-17 DIAGNOSIS — G8929 Other chronic pain: Secondary | ICD-10-CM | POA: Diagnosis not present

## 2012-10-17 DIAGNOSIS — K219 Gastro-esophageal reflux disease without esophagitis: Secondary | ICD-10-CM | POA: Diagnosis not present

## 2012-10-17 DIAGNOSIS — Z87891 Personal history of nicotine dependence: Secondary | ICD-10-CM | POA: Insufficient documentation

## 2012-10-17 DIAGNOSIS — F319 Bipolar disorder, unspecified: Secondary | ICD-10-CM | POA: Diagnosis not present

## 2012-10-17 DIAGNOSIS — B1920 Unspecified viral hepatitis C without hepatic coma: Secondary | ICD-10-CM | POA: Insufficient documentation

## 2012-10-17 DIAGNOSIS — Z79899 Other long term (current) drug therapy: Secondary | ICD-10-CM | POA: Insufficient documentation

## 2012-10-17 DIAGNOSIS — F1994 Other psychoactive substance use, unspecified with psychoactive substance-induced mood disorder: Secondary | ICD-10-CM | POA: Diagnosis not present

## 2012-10-17 DIAGNOSIS — F411 Generalized anxiety disorder: Secondary | ICD-10-CM | POA: Insufficient documentation

## 2012-10-17 DIAGNOSIS — R45850 Homicidal ideations: Secondary | ICD-10-CM

## 2012-10-17 DIAGNOSIS — Z862 Personal history of diseases of the blood and blood-forming organs and certain disorders involving the immune mechanism: Secondary | ICD-10-CM | POA: Insufficient documentation

## 2012-10-17 DIAGNOSIS — M109 Gout, unspecified: Secondary | ICD-10-CM | POA: Insufficient documentation

## 2012-10-17 DIAGNOSIS — F1010 Alcohol abuse, uncomplicated: Secondary | ICD-10-CM | POA: Diagnosis not present

## 2012-10-17 DIAGNOSIS — Z0289 Encounter for other administrative examinations: Secondary | ICD-10-CM | POA: Insufficient documentation

## 2012-10-17 DIAGNOSIS — M549 Dorsalgia, unspecified: Secondary | ICD-10-CM | POA: Diagnosis not present

## 2012-10-17 DIAGNOSIS — Z7982 Long term (current) use of aspirin: Secondary | ICD-10-CM | POA: Insufficient documentation

## 2012-10-17 DIAGNOSIS — E119 Type 2 diabetes mellitus without complications: Secondary | ICD-10-CM | POA: Insufficient documentation

## 2012-10-17 DIAGNOSIS — I10 Essential (primary) hypertension: Secondary | ICD-10-CM | POA: Insufficient documentation

## 2012-10-17 DIAGNOSIS — R209 Unspecified disturbances of skin sensation: Secondary | ICD-10-CM | POA: Insufficient documentation

## 2012-10-17 DIAGNOSIS — F3110 Bipolar disorder, current episode manic without psychotic features, unspecified: Secondary | ICD-10-CM

## 2012-10-17 DIAGNOSIS — Z8639 Personal history of other endocrine, nutritional and metabolic disease: Secondary | ICD-10-CM | POA: Insufficient documentation

## 2012-10-17 DIAGNOSIS — R079 Chest pain, unspecified: Secondary | ICD-10-CM

## 2012-10-17 DIAGNOSIS — Z8619 Personal history of other infectious and parasitic diseases: Secondary | ICD-10-CM | POA: Insufficient documentation

## 2012-10-17 DIAGNOSIS — F3112 Bipolar disorder, current episode manic without psychotic features, moderate: Secondary | ICD-10-CM

## 2012-10-17 DIAGNOSIS — Z8679 Personal history of other diseases of the circulatory system: Secondary | ICD-10-CM | POA: Insufficient documentation

## 2012-10-17 LAB — CBC WITH DIFFERENTIAL/PLATELET
Basophils Absolute: 0 10*3/uL (ref 0.0–0.1)
Basophils Absolute: 0 10*3/uL (ref 0.0–0.1)
Basophils Relative: 0 % (ref 0–1)
Basophils Relative: 0 % (ref 0–1)
Eosinophils Absolute: 0 10*3/uL (ref 0.0–0.7)
Eosinophils Absolute: 0.3 10*3/uL (ref 0.0–0.7)
Eosinophils Relative: 0 % (ref 0–5)
Eosinophils Relative: 2 % (ref 0–5)
HCT: 41.5 % (ref 39.0–52.0)
HCT: 42.9 % (ref 39.0–52.0)
Hemoglobin: 14.3 g/dL (ref 13.0–17.0)
Hemoglobin: 14.4 g/dL (ref 13.0–17.0)
Lymphocytes Relative: 13 % (ref 12–46)
Lymphocytes Relative: 19 % (ref 12–46)
Lymphs Abs: 1.4 10*3/uL (ref 0.7–4.0)
Lymphs Abs: 2.1 10*3/uL (ref 0.7–4.0)
MCH: 27.7 pg (ref 26.0–34.0)
MCH: 27.2 pg (ref 26.0–34.0)
MCHC: 34.5 g/dL (ref 30.0–36.0)
MCHC: 33.6 g/dL (ref 30.0–36.0)
MCV: 80.3 fL (ref 78.0–100.0)
MCV: 81.1 fL (ref 78.0–100.0)
Monocytes Absolute: 0.7 10*3/uL (ref 0.1–1.0)
Monocytes Absolute: 1.1 10*3/uL — ABNORMAL HIGH (ref 0.1–1.0)
Monocytes Relative: 7 % (ref 3–12)
Monocytes Relative: 9 % (ref 3–12)
Neutro Abs: 8.5 10*3/uL — ABNORMAL HIGH (ref 1.7–7.7)
Neutro Abs: 8.1 10*3/uL — ABNORMAL HIGH (ref 1.7–7.7)
Neutrophils Relative %: 80 % — ABNORMAL HIGH (ref 43–77)
Neutrophils Relative %: 70 % (ref 43–77)
Platelets: 232 10*3/uL (ref 150–400)
Platelets: 261 10*3/uL (ref 150–400)
RBC: 5.17 MIL/uL (ref 4.22–5.81)
RBC: 5.29 MIL/uL (ref 4.22–5.81)
RDW: 15.2 % (ref 11.5–15.5)
RDW: 15.4 % (ref 11.5–15.5)
WBC: 10.6 10*3/uL — ABNORMAL HIGH (ref 4.0–10.5)
WBC: 11.6 10*3/uL — ABNORMAL HIGH (ref 4.0–10.5)

## 2012-10-17 LAB — BASIC METABOLIC PANEL
BUN: 15 mg/dL (ref 6–23)
BUN: 16 mg/dL (ref 6–23)
CO2: 23 mEq/L (ref 19–32)
CO2: 23 mEq/L (ref 19–32)
Calcium: 10 mg/dL (ref 8.4–10.5)
Calcium: 9.6 mg/dL (ref 8.4–10.5)
Chloride: 105 mEq/L (ref 96–112)
Chloride: 106 mEq/L (ref 96–112)
Creatinine, Ser: 0.84 mg/dL (ref 0.50–1.35)
Creatinine, Ser: 0.89 mg/dL (ref 0.50–1.35)
GFR calc Af Amer: >90 mL/min (ref >90)
GFR calc Af Amer: >90 mL/min (ref >90)
GFR calc non Af Amer: >90 mL/min (ref >90)
GFR calc non Af Amer: >90 mL/min (ref >90)
Glucose, Bld: 124 mg/dL — ABNORMAL HIGH (ref 70–99)
Glucose, Bld: 114 mg/dL — ABNORMAL HIGH (ref 70–99)
Potassium: 3.4 mEq/L — ABNORMAL LOW (ref 3.5–5.1)
Potassium: 3.7 mEq/L (ref 3.5–5.1)
Sodium: 142 mEq/L (ref 135–145)
Sodium: 140 mEq/L (ref 135–145)

## 2012-10-17 LAB — URINALYSIS, ROUTINE W REFLEX MICROSCOPIC
Glucose, UA: NEGATIVE mg/dL
Hgb urine dipstick: NEGATIVE
Ketones, ur: NEGATIVE mg/dL
Nitrite: NEGATIVE
Protein, ur: 30 mg/dL — AB
Specific Gravity, Urine: 1.033 — ABNORMAL HIGH (ref 1.005–1.030)
Urobilinogen, UA: 1 mg/dL (ref 0.0–1.0)
pH: 5.5 (ref 5.0–8.0)

## 2012-10-17 LAB — URINE MICROSCOPIC-ADD ON

## 2012-10-17 LAB — POCT I-STAT TROPONIN I
Troponin i, poc: 0 ng/mL (ref 0.00–0.08)
Troponin i, poc: 0 ng/mL (ref 0.00–0.08)

## 2012-10-17 LAB — RAPID URINE DRUG SCREEN, HOSP PERFORMED
Amphetamines: NOT DETECTED
Barbiturates: NOT DETECTED
Benzodiazepines: NOT DETECTED
Cocaine: POSITIVE — AB
Opiates: NOT DETECTED
Tetrahydrocannabinol: NOT DETECTED

## 2012-10-17 LAB — ETHANOL: Alcohol, Ethyl (B): <11 mg/dL (ref 0–11)

## 2012-10-17 LAB — TROPONIN I
Troponin I: <0.3 ng/mL (ref <0.30)
Troponin I: <0.3 ng/mL (ref <0.30)

## 2012-10-17 MED ORDER — LORAZEPAM 2 MG/ML IJ SOLN
1.0000 mg | Freq: Once | INTRAMUSCULAR | Status: AC
  Administered 2012-10-17: 1 mg via INTRAVENOUS
  Filled 2012-10-17: qty 1

## 2012-10-17 MED ORDER — CARBAMAZEPINE ER 200 MG PO TB12: 200.0000 mg | ORAL_TABLET | Freq: Two times a day (BID) | ORAL | Status: DC

## 2012-10-17 MED ORDER — METOPROLOL SUCCINATE ER 25 MG PO TB24
25.0000 mg | ORAL_TABLET | Freq: Every day | ORAL | Status: DC
  Administered 2012-10-17: 25 mg via ORAL
  Filled 2012-10-17 (×2): qty 1

## 2012-10-17 MED ORDER — DULOXETINE HCL 30 MG PO CPEP
30.0000 mg | ORAL_CAPSULE | Freq: Every day | ORAL | Status: DC
  Administered 2012-10-17: 30 mg via ORAL
  Filled 2012-10-17 (×2): qty 1

## 2012-10-17 MED ORDER — ARIPIPRAZOLE 10 MG PO TABS: 10.0000 mg | ORAL_TABLET | Freq: Every morning | ORAL | Status: DC

## 2012-10-17 MED ORDER — LORATADINE 10 MG PO TABS
10.0000 mg | ORAL_TABLET | Freq: Every day | ORAL | Status: DC
  Administered 2012-10-17: 10 mg via ORAL
  Filled 2012-10-17 (×2): qty 1

## 2012-10-17 MED ORDER — GABAPENTIN 300 MG PO CAPS
300.0000 mg | ORAL_CAPSULE | Freq: Three times a day (TID) | ORAL | Status: DC
  Administered 2012-10-17: 300 mg via ORAL
  Filled 2012-10-17 (×4): qty 1

## 2012-10-17 MED ORDER — ASPIRIN EC 81 MG PO TBEC
81.0000 mg | DELAYED_RELEASE_TABLET | Freq: Every day | ORAL | Status: DC
  Administered 2012-10-17: 81 mg via ORAL
  Filled 2012-10-17 (×2): qty 1

## 2012-10-17 MED ORDER — ARIPIPRAZOLE 10 MG PO TABS
20.0000 mg | ORAL_TABLET | Freq: Every day | ORAL | Status: DC
  Filled 2012-10-17: qty 2

## 2012-10-17 MED ORDER — ARIPIPRAZOLE 10 MG PO TABS
20.0000 mg | ORAL_TABLET | Freq: Once | ORAL | Status: AC
  Administered 2012-10-17: 20 mg via ORAL
  Filled 2012-10-17: qty 2

## 2012-10-17 MED ORDER — PANTOPRAZOLE SODIUM 40 MG PO TBEC
40.0000 mg | DELAYED_RELEASE_TABLET | Freq: Every day | ORAL | Status: DC
  Administered 2012-10-17: 40 mg via ORAL
  Filled 2012-10-17: qty 1

## 2012-10-17 MED ORDER — CITALOPRAM HYDROBROMIDE 20 MG PO TABS
20.0000 mg | ORAL_TABLET | Freq: Every day | ORAL | Status: DC
  Administered 2012-10-17: 20 mg via ORAL
  Filled 2012-10-17 (×2): qty 1

## 2012-10-17 MED ORDER — VITAMIN D (ERGOCALCIFEROL) 1.25 MG (50000 UNIT) PO CAPS
50000.0000 [IU] | ORAL_CAPSULE | ORAL | Status: DC
  Administered 2012-10-17: 50000 [IU] via ORAL
  Filled 2012-10-17: qty 1

## 2012-10-17 MED ORDER — ARIPIPRAZOLE 10 MG PO TABS: 10.0000 mg | ORAL_TABLET | Freq: Every day | ORAL | Status: DC

## 2012-10-17 MED ORDER — SODIUM CHLORIDE 0.9 % IV BOLUS (SEPSIS)
1000.0000 mL | Freq: Once | INTRAVENOUS | Status: AC
  Administered 2012-10-17: 1000 mL via INTRAVENOUS

## 2012-10-17 MED ORDER — ZOLPIDEM TARTRATE 5 MG PO TABS
5.0000 mg | ORAL_TABLET | Freq: Every evening | ORAL | Status: DC | PRN
  Administered 2012-10-17: 5 mg via ORAL
  Filled 2012-10-17: qty 1

## 2012-10-17 MED ORDER — LISINOPRIL 40 MG PO TABS
40.0000 mg | ORAL_TABLET | Freq: Every day | ORAL | Status: DC
  Administered 2012-10-17: 40 mg via ORAL
  Filled 2012-10-17 (×2): qty 1

## 2012-10-17 MED ORDER — KETOROLAC TROMETHAMINE 30 MG/ML IJ SOLN
30.0000 mg | Freq: Once | INTRAMUSCULAR | Status: AC
  Administered 2012-10-17: 30 mg via INTRAVENOUS
  Filled 2012-10-17: qty 1

## 2012-10-17 MED ORDER — FUROSEMIDE 20 MG PO TABS: 20.0000 mg | ORAL_TABLET | Freq: Two times a day (BID) | ORAL | Status: DC

## 2012-10-17 NOTE — Consult Note (Signed)
Reason for Consult:Eval for IP psychiatric mgmt Referring Physician: Radford Pax MD  [name] is an 52 y.o. male.  HPI: Pt is a 52 y/o AAM with long standing hx of bipolar d/o with concerns x one week duration of markedly exacerbated depressive sx to include crying spells, passive HI and SI, mood swings, feelings of isolation, hopelessness, helplessness and guilt. Patient notes his stressor is the 3rd year anniversary of the passing of his daughter from SIDS. Patient does endorse a HX of prior SA and recent use of crack and alcohol but denies habitual use or dependence upon illicit drugs. Patient denies any hx of alcohol detox, DT'S, or S/z d/o.  Past Medical History  Diagnosis Date  . Arthritis   . Hypertension   . Anxiety   . Bipolar affective disorder   . Depression   . Alcohol abuse   . Cocaine abuse     smokes crack-cocaine  . GERD (gastroesophageal reflux disease)   . Chronic back pain   . Tobacco abuse   . Hepatitis C   . Vitamin D deficiency   . Gout   . Hemorrhoids   . Diabetes mellitus without complication     History reviewed. No pertinent past surgical history.  Family History  Problem Relation Age of Onset  . Hypertension Brother   . Diabetes Brother   . Hypertension Mother   . Diabetes Mother   . Arthritis Mother   . Other Sister     bone disease    Social History:  reports that he quit smoking about 10 months ago. He does not have any smokeless tobacco history on file. He reports that  drinks alcohol. He reports that he uses illicit drugs (Marijuana and Cocaine).  Allergies: No Known Allergies  Medications: I have reviewed the patient's current medications.  Results for orders placed during the hospital encounter of 10/17/12 (from the past 48 hour(s))  URINALYSIS, ROUTINE W REFLEX MICROSCOPIC     Status: Abnormal   Collection Time    10/17/12  5:11 PM      Result Value Range   Color, Urine AMBER (*) YELLOW   Comment: BIOCHEMICALS MAY BE AFFECTED BY  COLOR   APPearance CLOUDY (*) CLEAR   Specific Gravity, Urine 1.033 (*) 1.005 - 1.030   pH 5.5  5.0 - 8.0   Glucose, UA NEGATIVE  NEGATIVE mg/dL   Hgb urine dipstick NEGATIVE  NEGATIVE   Bilirubin Urine SMALL (*) NEGATIVE   Ketones, ur NEGATIVE  NEGATIVE mg/dL   Protein, ur 30 (*) NEGATIVE mg/dL   Urobilinogen, UA 1.0  0.0 - 1.0 mg/dL   Nitrite NEGATIVE  NEGATIVE   Leukocytes, UA SMALL (*) NEGATIVE  URINE RAPID DRUG SCREEN (HOSP PERFORMED)     Status: Abnormal   Collection Time    10/17/12  5:11 PM      Result Value Range   Opiates NONE DETECTED  NONE DETECTED   Cocaine POSITIVE (*) NONE DETECTED   Benzodiazepines NONE DETECTED  NONE DETECTED   Amphetamines NONE DETECTED  NONE DETECTED   Tetrahydrocannabinol NONE DETECTED  NONE DETECTED   Barbiturates NONE DETECTED  NONE DETECTED   Comment:            DRUG SCREEN FOR MEDICAL PURPOSES     ONLY.  IF CONFIRMATION IS NEEDED     FOR ANY PURPOSE, NOTIFY LAB     WITHIN 5 DAYS.  LOWEST DETECTABLE LIMITS     FOR URINE DRUG SCREEN     Drug Class       Cutoff (ng/mL)     Amphetamine      1000     Barbiturate      200     Benzodiazepine   200     Tricyclics       300     Opiates          300     Cocaine          300     THC              50  URINE MICROSCOPIC-ADD ON     Status: None   Collection Time    10/17/12  5:11 PM      Result Value Range   Squamous Epithelial / LPF RARE  RARE   WBC, UA 11-20  <3 WBC/hpf   RBC / HPF 0-2  <3 RBC/hpf   Bacteria, UA RARE  RARE   Urine-Other MUCOUS PRESENT    CBC WITH DIFFERENTIAL     Status: Abnormal   Collection Time    10/17/12  5:14 PM      Result Value Range   WBC 11.6 (*) 4.0 - 10.5 K/uL   RBC 5.29  4.22 - 5.81 MIL/uL   Hemoglobin 14.4  13.0 - 17.0 g/dL   HCT 21.3  08.6 - 57.8 %   MCV 81.1  78.0 - 100.0 fL   MCH 27.2  26.0 - 34.0 pg   MCHC 33.6  30.0 - 36.0 g/dL   RDW 46.9  62.9 - 52.8 %   Platelets 261  150 - 400 K/uL   Neutrophils Relative % 70  43 - 77 %    Neutro Abs 8.1 (*) 1.7 - 7.7 K/uL   Lymphocytes Relative 19  12 - 46 %   Lymphs Abs 2.1  0.7 - 4.0 K/uL   Monocytes Relative 9  3 - 12 %   Monocytes Absolute 1.1 (*) 0.1 - 1.0 K/uL   Eosinophils Relative 2  0 - 5 %   Eosinophils Absolute 0.3  0.0 - 0.7 K/uL   Basophils Relative 0  0 - 1 %   Basophils Absolute 0.0  0.0 - 0.1 K/uL  BASIC METABOLIC PANEL     Status: Abnormal   Collection Time    10/17/12  5:14 PM      Result Value Range   Sodium 140  135 - 145 mEq/L   Potassium 3.7  3.5 - 5.1 mEq/L   Chloride 106  96 - 112 mEq/L   CO2 23  19 - 32 mEq/L   Glucose, Bld 114 (*) 70 - 99 mg/dL   BUN 16  6 - 23 mg/dL   Creatinine, Ser 4.13  0.50 - 1.35 mg/dL   Calcium 9.6  8.4 - 24.4 mg/dL   GFR calc non Af Amer >90  >90 mL/min   GFR calc Af Amer >90  >90 mL/min   Comment:            The eGFR has been calculated     using the CKD EPI equation.     This calculation has not been     validated in all clinical     situations.     eGFR's persistently     <90 mL/min signify     possible Chronic Kidney Disease.  ETHANOL     Status: None  Collection Time    10/17/12  5:14 PM      Result Value Range   Alcohol, Ethyl (B) <11  0 - 11 mg/dL   Comment:            LOWEST DETECTABLE LIMIT FOR     SERUM ALCOHOL IS 11 mg/dL     FOR MEDICAL PURPOSES ONLY  POCT I-STAT TROPONIN I     Status: None   Collection Time    10/17/12  6:22 PM      Result Value Range   Troponin i, poc 0.00  0.00 - 0.08 ng/mL   Comment 3            Comment: Due to the release kinetics of cTnI,     a negative result within the first hours     of the onset of symptoms does not rule out     myocardial infarction with certainty.     If myocardial infarction is still suspected,     repeat the test at appropriate intervals.  POCT I-STAT TROPONIN I     Status: None   Collection Time    10/17/12  9:00 PM      Result Value Range   Troponin i, poc 0.00  0.00 - 0.08 ng/mL   Comment 3            Comment: Due to the release  kinetics of cTnI,     a negative result within the first hours     of the onset of symptoms does not rule out     myocardial infarction with certainty.     If myocardial infarction is still suspected,     repeat the test at appropriate intervals.    Dg Chest 2 View  10/17/2012   *RADIOLOGY REPORT*  Clinical Data: Shortness of breath.  Hypertension.  CHEST - 2 VIEW  Comparison: 10/17/2012  Findings: Mild left lower lobe scarring or atelectasis shows no significant change.  No evidence of pulmonary consolidation or edema.  No evidence of pleural effusion.  Heart size is normal.  No mass or lymphadenopathy identified.  IMPRESSION: Stable mild left lower lobe scarring versus atelectasis.  No evidence of pulmonary consolidation or edema.   Original Report Authenticated By: Myles Rosenthal, M.D.   Dg Chest 2 View  10/17/2012   *RADIOLOGY REPORT*  Clinical Data: Chest pain  CHEST - 2 VIEW  Comparison: 07/13/2012  Findings: Normal heart size.  There is no pleural effusion or edema.  Scar versus atelectasis noted in the left base.  No airspace consolidation.  IMPRESSION:  1.  Left base scar versus atelectasis.   Original Report Authenticated By: Signa Kell, M.D.    Review of Systems  Psychiatric/Behavioral: Positive for depression, suicidal ideas, memory loss and substance abuse. Negative for hallucinations. The patient is nervous/anxious and has insomnia.        Patient endorsing passive SI and HI with stressor being the 3rd anniversary of the passing of his daughter from SIDS, denies AVH and cannot contract for safety at this time.  All other systems reviewed and are negative.   Blood pressure 128/81, pulse 79, temperature 97.5 F (36.4 C), temperature source Oral, resp. rate 20, SpO2 99.00%. Physical Exam  Nursing note and vitals reviewed. Constitutional: He appears well-developed and well-nourished.  HENT:  Head: Normocephalic.  Eyes: Pupils are equal, round, and reactive to light.  Neck: Neck  supple. No thyromegaly present.  Cardiovascular: Normal rate and regular rhythm.   Respiratory:  Breath sounds normal.  Skin: Skin is warm and dry.  Psychiatric:  somnolent but arousable AAM with poor eye contact, sad, flat affect and limited insight    Assessment/Plan: 1) Admit to [facility] hall for crises mgmt, safety and stabilization of acute/chronic Bipolar d/o with mania and related SI/HI 2) Social work to facilitate OP support services and OP psychiatric mgmt 3) Mgmt of applicable co-morbid conditions 4) Intensive IP psychotherapeutic intervention and psychotropic Rx   Mico Spark E 10/17/2012, 10:16 PM

## 2012-10-17 NOTE — ED Notes (Addendum)
Per EMS- pt reports left chest pain with radiation to left arm that started approx 0600 this morning. Pt reports using cocaine prior to pain starting. 136/97. Hr 118. cbg 125. Pt received 324mg  asprin and 1 nitro with some relief.

## 2012-10-17 NOTE — ED Notes (Signed)
MD at bedside. 

## 2012-10-17 NOTE — BH Assessment (Signed)
Assessment Note   [name] is an 52 y.o. male.   See PA evaluation for details  Pt expresses vague SI with no plan or intent currently but has reported desire to die due to anniversary of daughter's death.  Pt also expresses HI but not towards anyone in particular.  Pt has hx of HI and SI and in some cases AVH but not today.  Pt also has SA issues.  Pt has been [facility] many times and is going to 504-2 via [facility] [place] to Dr. Elsie Saas services and will go by [facility] since pt is IVC.  Axis I: Bipolar, Manic Axis II: Deferred Axis III:  Past Medical History  Diagnosis Date  . Arthritis   . Hypertension   . Anxiety   . Bipolar affective disorder   . Depression   . Alcohol abuse   . Cocaine abuse     smokes crack-cocaine  . GERD (gastroesophageal reflux disease)   . Chronic back pain   . Tobacco abuse   . Hepatitis C   . Vitamin D deficiency   . Gout   . Hemorrhoids   . Diabetes mellitus without complication    Axis IV: other psychosocial or environmental problems, problems related to social environment and problems with primary support group Axis V: 31-40 impairment in reality testing  Past Medical History:  Past Medical History  Diagnosis Date  . Arthritis   . Hypertension   . Anxiety   . Bipolar affective disorder   . Depression   . Alcohol abuse   . Cocaine abuse     smokes crack-cocaine  . GERD (gastroesophageal reflux disease)   . Chronic back pain   . Tobacco abuse   . Hepatitis C   . Vitamin D deficiency   . Gout   . Hemorrhoids   . Diabetes mellitus without complication     History reviewed. No pertinent past surgical history.  Family History:  Family History  Problem Relation Age of Onset  . Hypertension Brother   . Diabetes Brother   . Hypertension Mother   . Diabetes Mother   . Arthritis Mother   . Other Sister     bone disease    Social History:  reports that he quit smoking about 10 months ago. He does not have any smokeless tobacco  history on file. He reports that  drinks alcohol. He reports that he uses illicit drugs (Marijuana and Cocaine).  Additional Social History:     CIWA: CIWA-Ar BP: 128/81 mmHg Pulse Rate: 79 COWS:    Allergies: No Known Allergies  Home Medications:  (Not in a hospital admission)  OB/GYN Status:  No LMP for male patient.  General Assessment Data Location of Assessment: [facility] ED ACT Assessment: Yes Living Arrangements: Non-relatives/Friends Can pt return to current living arrangement?: Yes Admission Status: Involuntary Is patient capable of signing voluntary admission?: Yes Transfer from: Acute Hospital Referral Source: MD  Education Status Is patient currently in school?: No  Risk to self Suicidal Ideation: Yes-Currently Present Suicidal Intent: Yes-Currently Present Is patient at risk for suicide?: Yes Suicidal Plan?: Yes-Currently Present Specify Current Suicidal Plan: no plan Access to Means: Yes Specify Access to Suicidal Means: has long hx of attempts What has been your use of drugs/alcohol within the last 12 months?: yes Previous Attempts/Gestures: Yes How many times?: 3 Other Self Harm Risks: na Triggers for Past Attempts: Anniversary Intentional Self Injurious Behavior: None Family Suicide History: No Recent stressful life event(s): Other (Comment) (anniversary  of daughter death) Persecutory voices/beliefs?: No Depression: Yes Depression Symptoms: Tearfulness;Isolating;Fatigue;Guilt;Loss of interest in usual pleasures;Feeling worthless/self pity;Feeling angry/irritable Substance abuse history and/or treatment for substance abuse?: Yes Suicide prevention information given to non-admitted patients: Not applicable  Risk to Others Homicidal Ideation: Yes-Currently Present Thoughts of Harm to Others: Yes-Currently Present Comment - Thoughts of Harm to Others: having thoughts see PA comments Current Homicidal Intent: No Current Homicidal Plan: No Access to  Homicidal Means: No Identified Victim: na History of harm to others?: Yes Assessment of Violence: In distant past Violent Behavior Description: fighting with others Does patient have access to weapons?: No Criminal Charges Pending?: No Does patient have a court date: No  Psychosis Hallucinations: None noted Delusions: None noted  Mental Status Report Appear/Hygiene: Disheveled Eye Contact: Fair Motor Activity: Unremarkable Speech: Logical/coherent Level of Consciousness: Alert Mood: Depressed;Sad Affect: Angry;Anxious;Depressed;Sad Anxiety Level: Moderate Thought Processes: Coherent Judgement: Impaired Orientation: Person;Place;Situation Obsessive Compulsive Thoughts/Behaviors: Minimal  Cognitive Functioning Concentration: Decreased Memory: Recent Intact;Remote Intact IQ: Average Insight: Poor Impulse Control: Poor Appetite: Poor Weight Loss: 0 Weight Gain: 0 Sleep: Decreased Total Hours of Sleep: 3 Vegetative Symptoms: None  ADLScreening [facility] Assessment Services) Patient's cognitive ability adequate to safely complete daily activities?: Yes Patient able to express need for assistance with ADLs?: Yes Independently performs ADLs?: Yes (appropriate for developmental age)  Abuse/Neglect [facility]) Physical Abuse: Denies Verbal Abuse: Denies Sexual Abuse: Denies  Prior Inpatient Therapy Prior Inpatient Therapy: Yes Prior Therapy Dates: 2014 Prior Therapy Facilty/Provider([facility]): [facility] Reason for Treatment: si  Prior Outpatient Therapy Prior Outpatient Therapy: Yes Prior Therapy Dates: 2014 [facility] Reason for Treatment: med mgt  ADL Screening (condition at time of admission) Patient's cognitive ability adequate to safely complete daily activities?: Yes Patient able to express need for assistance with ADLs?: Yes Independently performs ADLs?: Yes (appropriate for developmental age)  Home Assistive Devices/Equipment Home Assistive  Devices/Equipment: Medical laboratory scientific officer (specify quad or straight)    Abuse/Neglect Assessment (Assessment to be complete while patient is alone) Physical Abuse: Denies Verbal Abuse: Denies Sexual Abuse: Denies          Additional Information 1:1 In Past 12 Months?: No CIRT Risk: No Elopement Risk: No Does patient have medical clearance?: Yes     Disposition:  Disposition Initial Assessment Completed for this Encounter: Yes Disposition of Patient: Inpatient treatment program Type of inpatient treatment program: Adult  On Site Evaluation by:   Reviewed with Physician:     Titus Mould, Eppie Gibson 10/17/2012 10:15 PM

## 2012-10-17 NOTE — ED Provider Notes (Signed)
History    This chart was scribed for non-physician practitioner Roxy Horseman PA-C, working with Nelia Shi, MD by Donne Anon, ED Scribe. This patient was seen in room [facility] and the patient's care was started at 1728.  CSN: [id number] Arrival date & time 10/17/12  1647  First MD Initiated Contact with Patient 10/17/12 1728     Chief Complaint  Patient presents with  . Medical Clearance    The history is provided by the patient. No language interpreter was used.   HPI Comments: [name] is a 52 y.o. male who presents to the Emergency Department needing medical clearance. He states he has SI and wants to shoot himself. He celebrated his deceased daughters birthday 4 days ago and has had SI since then. He states over the past 4 days he has also drank "a lot" and used crack cocaine. He had an episode of CP with diaphoresis and emesis after he used crack cocaine at 0530 today. He is not currently having CP. He states his [name] doctor is on vacation this week. [facility]   Past Medical History  Diagnosis Date  . Arthritis   . Hypertension   . Anxiety   . Bipolar affective disorder   . Depression   . Alcohol abuse   . Cocaine abuse     smokes crack-cocaine  . GERD (gastroesophageal reflux disease)   . Chronic back pain   . Tobacco abuse   . Hepatitis C   . Vitamin D deficiency   . Gout   . Hemorrhoids   . Diabetes mellitus without complication    History reviewed. No pertinent past surgical history. Family History  Problem Relation Age of Onset  . Hypertension Brother   . Diabetes Brother   . Hypertension Mother   . Diabetes Mother   . Arthritis Mother   . Other Sister     bone disease   History  Substance Use Topics  . Smoking status: Former Smoker    Quit date: 11/23/2011  . Smokeless tobacco: Not on file  . Alcohol Use: Yes     Comment: 24 oz--3x's wkly     Review of Systems A complete 10 system review of systems was  obtained and all systems are negative except as noted in the HPI and PMH.    Allergies  Review of patient's allergies indicates no known allergies.  Home Medications   Current Outpatient Rx  Name  Route  Sig  Dispense  Refill  . amLODipine (NORVASC) 10 MG tablet   Oral   Take 10 mg by mouth daily.         . ARIPiprazole (ABILIFY) 20 MG tablet   Oral   Take 10 mg by mouth every morning.         Marland Kitchen aspirin EC 81 MG tablet   Oral   Take 81 mg by mouth daily.         . carbamazepine (TEGRETOL XR) 200 MG 12 hr tablet   Oral   Take 1 tablet (200 mg total) by mouth 2 (two) times daily. For mood stabilization   60 tablet   0   . cetirizine (ZYRTEC) 10 MG tablet   Oral   Take 10 mg by mouth daily.         . citalopram (CELEXA) 40 MG tablet   Oral   Take 20 mg by mouth daily.         . DULoxetine (  CYMBALTA) 30 MG capsule   Oral   Take 1 capsule (30 mg total) by mouth daily. For depression   30 capsule   0   . fish oil-omega-3 fatty acids 1000 MG capsule   Oral   Take 2 g by mouth daily.         . furosemide (LASIX) 20 MG tablet   Oral   Take 1 tablet (20 mg total) by mouth 2 (two) times daily. For swellings   60 tablet   0   . gabapentin (NEURONTIN) 300 MG capsule   Oral   Take 1 capsule (300 mg total) by mouth 3 (three) times daily. For anxiety symptoms/pain control   90 capsule   0   . HYDROXYZINE PAMOATE PO   Oral   Take 150 mg by mouth at bedtime.         Marland Kitchen lisinopril (PRINIVIL,ZESTRIL) 40 MG tablet   Oral   Take 1 tablet (40 mg total) by mouth daily. For control of high blood pressure   30 tablet   0   . lisinopril (PRINIVIL,ZESTRIL) 40 MG tablet   Oral   Take 40 mg by mouth daily.         . metoprolol succinate (TOPROL-XL) 50 MG 24 hr tablet   Oral   Take 25 mg by mouth daily. Take with or immediately following a meal.         . omeprazole (PRILOSEC) 20 MG capsule   Oral   Take 40 mg by mouth 2 (two) times daily.         .  Vitamin D, Ergocalciferol, (DRISDOL) 50000 UNITS CAPS   Oral   Take 50,000 Units by mouth every 7 (seven) days.         Marland Kitchen zolpidem (AMBIEN) 10 MG tablet   Oral   Take 5 mg by mouth at bedtime as needed for sleep.          BP 134/94  Pulse 112  Temp(Src) 98 F (36.7 C)  Resp 22  SpO2 96% Physical Exam  Nursing note and vitals reviewed. Constitutional: He appears well-developed and well-nourished. No distress.  HENT:  Head: Normocephalic and atraumatic.  Mouth/Throat: Oropharynx is clear and moist. No oropharyngeal exudate.  Eyes: Conjunctivae are normal.  Neck: Neck supple. No tracheal deviation present.  Cardiovascular: Normal rate, regular rhythm and normal heart sounds.  Exam reveals no gallop and no friction rub.   No murmur heard. Pulmonary/Chest: Effort normal and breath sounds normal. No respiratory distress. He has no wheezes. He has no rales. He exhibits no tenderness.  Abdominal: Soft. He exhibits no distension. There is no tenderness.  Musculoskeletal: Normal range of motion.  Neurological: He is alert.  Skin: Skin is warm and dry.  Psychiatric: He has a normal mood and affect. His behavior is normal.    ED Course  Procedures (including critical care time) DIAGNOSTIC STUDIES: Oxygen Saturation is 96% on RA, adequate by my interpretation.    COORDINATION OF CARE: 5:33 PM Discussed treatment plan which includes labs and consult with ACT team with pt at bedside and pt agreed to plan.    Labs Reviewed  URINALYSIS, ROUTINE W REFLEX MICROSCOPIC - Abnormal; Notable for the following:    Color, Urine AMBER (*)    APPearance CLOUDY (*)    Specific Gravity, Urine 1.033 (*)    Bilirubin Urine SMALL (*)    Protein, ur 30 (*)    Leukocytes, UA SMALL (*)    All  other components within normal limits  CBC WITH DIFFERENTIAL - Abnormal; Notable for the following:    WBC 11.6 (*)    Neutro Abs 8.1 (*)    Monocytes Absolute 1.1 (*)    All other components within  normal limits  BASIC METABOLIC PANEL - Abnormal; Notable for the following:    Glucose, Bld 114 (*)    All other components within normal limits  ETHANOL  URINE MICROSCOPIC-ADD ON  URINE RAPID DRUG SCREEN (HOSP PERFORMED)  POCT I-STAT TROPONIN I   Dg Chest 2 View  10/17/2012   *RADIOLOGY REPORT*  Clinical Data: Shortness of breath.  Hypertension.  CHEST - 2 VIEW  Comparison: 10/17/2012  Findings: Mild left lower lobe scarring or atelectasis shows no significant change.  No evidence of pulmonary consolidation or edema.  No evidence of pleural effusion.  Heart size is normal.  No mass or lymphadenopathy identified.  IMPRESSION: Stable mild left lower lobe scarring versus atelectasis.  No evidence of pulmonary consolidation or edema.   Original Report Authenticated By: Myles Rosenthal, M.D.   Dg Chest 2 View  10/17/2012   *RADIOLOGY REPORT*  Clinical Data: Chest pain  CHEST - 2 VIEW  Comparison: 07/13/2012  Findings: Normal heart size.  There is no pleural effusion or edema.  Scar versus atelectasis noted in the left base.  No airspace consolidation.  IMPRESSION:  1.  Left base scar versus atelectasis.   Original Report Authenticated By: Signa Kell, M.D.   1. Bipolar 1 disorder with moderate mania   2. Drug-induced mood disorder   3. Suicidal ideations     MDM   I personally performed the services described in this documentation, which was scribed in my presence. The recorded information has been reviewed and is accurate.     Roxy Horseman, PA-C 10/17/12 2359

## 2012-10-17 NOTE — ED Notes (Signed)
Pt reports headache after 1 nitro. Reports no relief from pain.

## 2012-10-17 NOTE — ED Notes (Signed)
Pt here with PD for medical clearance. Pt admits to wanting to "shot myself" Pt states that he has been having thoughts since July 6th.

## 2012-10-17 NOTE — ED Provider Notes (Signed)
History    CSN: [id number] Arrival date & time 10/17/12  6644  First MD Initiated Contact with Patient 10/17/12 [phone]     Chief Complaint  Patient presents with  . Chest Pain   (Consider location/radiation/quality/duration/timing/severity/associated sxs/prior Treatment) Patient is a 52 y.o. male presenting with chest pain.  Chest Pain  Pt with long history of substance abuse reports onset of moderate to severe midsternal chest pain about 2hours prior to arrival after using cocaine. He initially told the nurse he was having L arm pain, but tells me it is R arm pain and tingling. He denies SOB. Given ASA and NTG enroute. States NTG gave him headache but no change in CP (told the nurse it helped a little bit). He reports history of 'heart attack' about 6 months ago but review of the EMR shows that he had an admission for cocaine related chest pain with negative troponin x 4 and no heart cath done.   Past Medical History  Diagnosis Date  . Arthritis   . Hypertension   . Anxiety   . Bipolar affective disorder   . Depression   . Alcohol abuse   . Cocaine abuse     smokes crack-cocaine  . GERD (gastroesophageal reflux disease)   . Chronic back pain   . Tobacco abuse   . Hepatitis C   . Vitamin D deficiency   . Gout   . Hemorrhoids   . Diabetes mellitus without complication    History reviewed. No pertinent past surgical history. Family History  Problem Relation Age of Onset  . Hypertension Brother   . Diabetes Brother   . Hypertension Mother   . Diabetes Mother   . Arthritis Mother   . Other Sister     bone disease   History  Substance Use Topics  . Smoking status: Former Smoker    Quit date: 11/23/2011  . Smokeless tobacco: Not on file  . Alcohol Use: Yes     Comment: 24 oz--3x's wkly     Review of Systems  Cardiovascular: Positive for chest pain.   All other systems reviewed and are negative except as noted in HPI.   Allergies  Review of patient's allergies  indicates no known allergies.  Home Medications   Current Outpatient Rx  Name  Route  Sig  Dispense  Refill  . amLODipine (NORVASC) 10 MG tablet   Oral   Take 1 tablet (10 mg total) by mouth daily. For control of high blood pressure   30 tablet   0   . amLODipine (NORVASC) 10 MG tablet   Oral   Take 10 mg by mouth daily.         . ARIPiprazole (ABILIFY) 20 MG tablet   Oral   Take 10 mg by mouth every morning.         Marland Kitchen aspirin EC 81 MG tablet   Oral   Take 81 mg by mouth daily.         . carbamazepine (TEGRETOL XR) 200 MG 12 hr tablet   Oral   Take 1 tablet (200 mg total) by mouth 2 (two) times daily. For mood stabilization   60 tablet   0   . cetirizine (ZYRTEC) 10 MG tablet   Oral   Take 10 mg by mouth daily.         . citalopram (CELEXA) 40 MG tablet   Oral   Take 20 mg by mouth daily.         Marland Kitchen  DULoxetine (CYMBALTA) 30 MG capsule   Oral   Take 1 capsule (30 mg total) by mouth daily. For depression   30 capsule   0   . fish oil-omega-3 fatty acids 1000 MG capsule   Oral   Take 2 g by mouth daily.         . furosemide (LASIX) 20 MG tablet   Oral   Take 1 tablet (20 mg total) by mouth 2 (two) times daily. For swellings   60 tablet   0   . gabapentin (NEURONTIN) 300 MG capsule   Oral   Take 1 capsule (300 mg total) by mouth 3 (three) times daily. For anxiety symptoms/pain control   90 capsule   0   . hydrocortisone (HEMORRHOIDAL-HC) 25 MG suppository   Rectal   Place 25 mg rectally 2 (two) times daily.         Marland Kitchen HYDROXYZINE PAMOATE PO   Oral   Take 150 mg by mouth at bedtime.         Marland Kitchen lisinopril (PRINIVIL,ZESTRIL) 40 MG tablet   Oral   Take 1 tablet (40 mg total) by mouth daily. For control of high blood pressure   30 tablet   0   . lisinopril (PRINIVIL,ZESTRIL) 40 MG tablet   Oral   Take 40 mg by mouth daily.         . methocarbamol (ROBAXIN) 750 MG tablet   Oral   Take 750 mg by mouth 3 (three) times daily as needed  (muscle spams).         . metoprolol succinate (TOPROL-XL) 50 MG 24 hr tablet   Oral   Take 25 mg by mouth daily. Take with or immediately following a meal.         . omeprazole (PRILOSEC) 20 MG capsule   Oral   Take 40 mg by mouth daily.         . traMADol (ULTRAM) 50 MG tablet   Oral   Take 50 mg by mouth every 6 (six) hours as needed for pain.         . traZODone (DESYREL) 50 MG tablet   Oral   Take 1 tablet (50 mg total) by mouth at bedtime and may repeat dose one time if needed. For depression/sleep   60 tablet   0   . Vitamin D, Ergocalciferol, (DRISDOL) 50000 UNITS CAPS   Oral   Take 50,000 Units by mouth every 7 (seven) days.         Marland Kitchen zolpidem (AMBIEN) 10 MG tablet   Oral   Take 5 mg by mouth at bedtime as needed for sleep.          BP 124/87  Pulse 110  Temp(Src) 97.8 F (36.6 C) (Oral)  Resp 27  SpO2 94% Physical Exam  Nursing note and vitals reviewed. Constitutional: He is oriented to person, place, and time. He appears well-developed and well-nourished.  HENT:  Head: Normocephalic and atraumatic.  Eyes: EOM are normal. Pupils are equal, round, and reactive to light.  Neck: Normal range of motion. Neck supple.  Cardiovascular: Normal heart sounds and intact distal pulses.  Tachycardia present.   Pulmonary/Chest: Effort normal and breath sounds normal.  Abdominal: Bowel sounds are normal. He exhibits no distension. There is no tenderness.  Musculoskeletal: Normal range of motion. He exhibits no edema and no tenderness.  Neurological: He is alert and oriented to person, place, and time. He has normal strength. No cranial nerve deficit  or sensory deficit.  Skin: Skin is warm and dry. No rash noted.  Psychiatric: He has a normal mood and affect.    ED Course  Procedures (including critical care time) Labs Reviewed  CBC WITH DIFFERENTIAL - Abnormal; Notable for the following:    WBC 10.6 (*)    Neutrophils Relative % 80 (*)    Neutro Abs 8.5  (*)    All other components within normal limits  BASIC METABOLIC PANEL - Abnormal; Notable for the following:    Potassium 3.4 (*)    Glucose, Bld 124 (*)    All other components within normal limits  TROPONIN I  TROPONIN I   Dg Chest 2 View  10/17/2012   *RADIOLOGY REPORT*  Clinical Data: Chest pain  CHEST - 2 VIEW  Comparison: 07/13/2012  Findings: Normal heart size.  There is no pleural effusion or edema.  Scar versus atelectasis noted in the left base.  No airspace consolidation.  IMPRESSION:  1.  Left base scar versus atelectasis.   Original Report Authenticated By: Signa Kell, M.D.   1. Chest pain   2. Cocaine abuse     MDM   Date: 10/17/2012  Rate: 109  Rhythm: sinus tachycardia  QRS Axis: normal  Intervals: normal  ST/T Wave abnormalities: normal  Conduction Disutrbances:none  Narrative Interpretation:   Old EKG Reviewed: unchanged  11:13 AM Labs and imaging reviewed including Trop neg x 2. Pt has been sleeping soundly through his ED stay but on awakening still complaining of chest pain. No concern for ACS, PE or other acute life threatening etiology. Advised to avoid cocaine use. PCP follow up. Toradol for pain prior to discharge.    Tessla Spurling B. Bernette Mayers, MD 10/17/12 1114

## 2012-10-17 NOTE — ED Notes (Signed)
Pt reports he is SI,  IVC, plan to shoot self.  Depressed about deceased daughter. Pt also HI, willnot elaborate further,  States his opinions will get him in trouble.  Pt calm, cooperative & sleepy.  Pt eval by PA Spencer.

## 2012-10-18 ENCOUNTER — Encounter (HOSPITAL_COMMUNITY): Payer: Self-pay

## 2012-10-18 ENCOUNTER — Inpatient Hospital Stay (HOSPITAL_COMMUNITY)
Admission: EM | Admit: 2012-10-18 | Discharge: 2012-10-21 | DRG: 885 | Disposition: A | Discharge status: Home / Self Care | Payer: 59 | Source: Intra-hospital | Attending: Psychiatry | Admitting: Psychiatry

## 2012-10-18 DIAGNOSIS — F411 Generalized anxiety disorder: Secondary | ICD-10-CM | POA: Diagnosis present

## 2012-10-18 DIAGNOSIS — B1920 Unspecified viral hepatitis C without hepatic coma: Secondary | ICD-10-CM | POA: Diagnosis present

## 2012-10-18 DIAGNOSIS — F1920 Other psychoactive substance dependence, uncomplicated: Secondary | ICD-10-CM

## 2012-10-18 DIAGNOSIS — R45851 Suicidal ideations: Secondary | ICD-10-CM

## 2012-10-18 DIAGNOSIS — I10 Essential (primary) hypertension: Secondary | ICD-10-CM | POA: Diagnosis present

## 2012-10-18 DIAGNOSIS — Z79899 Other long term (current) drug therapy: Secondary | ICD-10-CM

## 2012-10-18 DIAGNOSIS — M129 Arthropathy, unspecified: Secondary | ICD-10-CM | POA: Diagnosis present

## 2012-10-18 DIAGNOSIS — F1020 Alcohol dependence, uncomplicated: Secondary | ICD-10-CM | POA: Diagnosis present

## 2012-10-18 DIAGNOSIS — R2681 Unsteadiness on feet: Secondary | ICD-10-CM

## 2012-10-18 DIAGNOSIS — R4182 Altered mental status, unspecified: Secondary | ICD-10-CM

## 2012-10-18 DIAGNOSIS — K219 Gastro-esophageal reflux disease without esophagitis: Secondary | ICD-10-CM

## 2012-10-18 DIAGNOSIS — F1010 Alcohol abuse, uncomplicated: Secondary | ICD-10-CM

## 2012-10-18 DIAGNOSIS — F32A Depression, unspecified: Secondary | ICD-10-CM | POA: Diagnosis present

## 2012-10-18 DIAGNOSIS — Z720 Tobacco use: Secondary | ICD-10-CM

## 2012-10-18 DIAGNOSIS — F319 Bipolar disorder, unspecified: Secondary | ICD-10-CM

## 2012-10-18 DIAGNOSIS — M79672 Pain in left foot: Secondary | ICD-10-CM

## 2012-10-18 DIAGNOSIS — R079 Chest pain, unspecified: Secondary | ICD-10-CM

## 2012-10-18 DIAGNOSIS — M79671 Pain in right foot: Secondary | ICD-10-CM

## 2012-10-18 DIAGNOSIS — F17200 Nicotine dependence, unspecified, uncomplicated: Secondary | ICD-10-CM | POA: Diagnosis present

## 2012-10-18 DIAGNOSIS — G8929 Other chronic pain: Secondary | ICD-10-CM

## 2012-10-18 DIAGNOSIS — M109 Gout, unspecified: Secondary | ICD-10-CM

## 2012-10-18 DIAGNOSIS — E119 Type 2 diabetes mellitus without complications: Secondary | ICD-10-CM | POA: Diagnosis present

## 2012-10-18 DIAGNOSIS — J3489 Other specified disorders of nose and nasal sinuses: Secondary | ICD-10-CM | POA: Diagnosis present

## 2012-10-18 DIAGNOSIS — F419 Anxiety disorder, unspecified: Secondary | ICD-10-CM

## 2012-10-18 DIAGNOSIS — E876 Hypokalemia: Secondary | ICD-10-CM

## 2012-10-18 DIAGNOSIS — E559 Vitamin D deficiency, unspecified: Secondary | ICD-10-CM

## 2012-10-18 DIAGNOSIS — F329 Major depressive disorder, single episode, unspecified: Secondary | ICD-10-CM | POA: Diagnosis present

## 2012-10-18 DIAGNOSIS — M549 Dorsalgia, unspecified: Secondary | ICD-10-CM | POA: Diagnosis present

## 2012-10-18 DIAGNOSIS — F1910 Other psychoactive substance abuse, uncomplicated: Secondary | ICD-10-CM

## 2012-10-18 DIAGNOSIS — F3130 Bipolar disorder, current episode depressed, mild or moderate severity, unspecified: Principal | ICD-10-CM | POA: Diagnosis present

## 2012-10-18 DIAGNOSIS — K649 Unspecified hemorrhoids: Secondary | ICD-10-CM | POA: Diagnosis present

## 2012-10-18 DIAGNOSIS — F1410 Cocaine abuse, uncomplicated: Secondary | ICD-10-CM

## 2012-10-18 LAB — GLUCOSE, CAPILLARY
Glucose-Capillary: 110 mg/dL — ABNORMAL HIGH (ref 70–99)
Glucose-Capillary: 103 mg/dL — ABNORMAL HIGH (ref 70–99)

## 2012-10-18 MED ORDER — ACETAMINOPHEN 325 MG PO TABS
650.0000 mg | ORAL_TABLET | Freq: Four times a day (QID) | ORAL | Status: DC | PRN
  Administered 2012-10-18: 650 mg via ORAL

## 2012-10-18 MED ORDER — LORATADINE 10 MG PO TABS
10.0000 mg | ORAL_TABLET | Freq: Every day | ORAL | Status: DC
  Administered 2012-10-18 – 2012-10-21 (×4): 10 mg via ORAL
  Filled 2012-10-18 (×6): qty 1

## 2012-10-18 MED ORDER — METHOCARBAMOL 500 MG PO TABS
750.0000 mg | ORAL_TABLET | Freq: Three times a day (TID) | ORAL | Status: DC | PRN
Start: 2012-10-18 — End: 2012-10-21

## 2012-10-18 MED ORDER — ASPIRIN EC 81 MG PO TBEC
81.0000 mg | DELAYED_RELEASE_TABLET | Freq: Every day | ORAL | Status: DC
  Administered 2012-10-18 – 2012-10-21 (×4): 81 mg via ORAL
  Filled 2012-10-18: qty 3
  Filled 2012-10-18 (×6): qty 1

## 2012-10-18 MED ORDER — HYDROXYZINE HCL 50 MG PO TABS
150.0000 mg | ORAL_TABLET | Freq: Every day | ORAL | Status: DC
  Administered 2012-10-18 – 2012-10-20 (×3): 150 mg via ORAL
  Filled 2012-10-18 (×4): qty 3
  Filled 2012-10-18: qty 9
  Filled 2012-10-18: qty 3

## 2012-10-18 MED ORDER — BENZTROPINE MESYLATE 2 MG PO TABS
2.0000 mg | ORAL_TABLET | Freq: Two times a day (BID) | ORAL | Status: DC
  Administered 2012-10-18 – 2012-10-21 (×7): 2 mg via ORAL
  Filled 2012-10-18 (×11): qty 1

## 2012-10-18 MED ORDER — CITALOPRAM HYDROBROMIDE 10 MG PO TABS
30.0000 mg | ORAL_TABLET | Freq: Every day | ORAL | Status: DC
  Administered 2012-10-19 – 2012-10-21 (×3): 30 mg via ORAL
  Filled 2012-10-18: qty 9
  Filled 2012-10-18 (×5): qty 3

## 2012-10-18 MED ORDER — METOPROLOL SUCCINATE ER 25 MG PO TB24
25.0000 mg | ORAL_TABLET | Freq: Every day | ORAL | Status: DC
  Administered 2012-10-18 – 2012-10-21 (×4): 25 mg via ORAL
  Filled 2012-10-18 (×6): qty 1

## 2012-10-18 MED ORDER — ARIPIPRAZOLE 10 MG PO TABS
25.0000 mg | ORAL_TABLET | Freq: Every day | ORAL | Status: DC
  Administered 2012-10-19 – 2012-10-21 (×3): 25 mg via ORAL
  Filled 2012-10-18 (×3): qty 3
  Filled 2012-10-18: qty 8
  Filled 2012-10-18 (×2): qty 3

## 2012-10-18 MED ORDER — MAGNESIUM HYDROXIDE 400 MG/5ML PO SUSP: 30.0000 mL | Freq: Every day | ORAL | Status: DC | PRN

## 2012-10-18 MED ORDER — INDOMETHACIN 50 MG PO CAPS
50.0000 mg | ORAL_CAPSULE | Freq: Four times a day (QID) | ORAL | Status: DC
  Administered 2012-10-18 – 2012-10-21 (×14): 50 mg via ORAL
  Filled 2012-10-18 (×21): qty 1

## 2012-10-18 MED ORDER — GABAPENTIN 300 MG PO CAPS
300.0000 mg | ORAL_CAPSULE | Freq: Three times a day (TID) | ORAL | Status: DC
  Administered 2012-10-18 (×2): 300 mg via ORAL
  Filled 2012-10-18 (×4): qty 1

## 2012-10-18 MED ORDER — ALUM & MAG HYDROXIDE-SIMETH 200-200-20 MG/5ML PO SUSP
30.0000 mL | ORAL | Status: DC | PRN
  Administered 2012-10-18 (×2): 30 mL via ORAL

## 2012-10-18 MED ORDER — GABAPENTIN 400 MG PO CAPS
400.0000 mg | ORAL_CAPSULE | Freq: Three times a day (TID) | ORAL | Status: DC
  Administered 2012-10-18 – 2012-10-21 (×9): 400 mg via ORAL
  Filled 2012-10-18 (×2): qty 1
  Filled 2012-10-18: qty 9
  Filled 2012-10-18 (×8): qty 1
  Filled 2012-10-18 (×2): qty 9
  Filled 2012-10-18 (×4): qty 1

## 2012-10-18 MED ORDER — CITALOPRAM HYDROBROMIDE 20 MG PO TABS
20.0000 mg | ORAL_TABLET | Freq: Every day | ORAL | Status: DC
  Administered 2012-10-18: 20 mg via ORAL
  Filled 2012-10-18 (×2): qty 1

## 2012-10-18 MED ORDER — LISINOPRIL 20 MG PO TABS
40.0000 mg | ORAL_TABLET | Freq: Every day | ORAL | Status: DC
  Administered 2012-10-18 – 2012-10-21 (×4): 40 mg via ORAL
  Filled 2012-10-18 (×6): qty 1
  Filled 2012-10-18: qty 6

## 2012-10-18 MED ORDER — VITAMIN D (ERGOCALCIFEROL) 1.25 MG (50000 UNIT) PO CAPS
50000.0000 [IU] | ORAL_CAPSULE | ORAL | Status: DC
  Administered 2012-10-18: 50000 [IU] via ORAL
  Filled 2012-10-18: qty 1

## 2012-10-18 MED ORDER — ARIPIPRAZOLE 10 MG PO TABS
20.0000 mg | ORAL_TABLET | Freq: Every day | ORAL | Status: DC
Start: 2012-10-18 — End: 2012-10-18
  Administered 2012-10-18: 20 mg via ORAL
  Filled 2012-10-18 (×2): qty 2

## 2012-10-18 MED ORDER — PANTOPRAZOLE SODIUM 40 MG PO TBEC
40.0000 mg | DELAYED_RELEASE_TABLET | Freq: Every day | ORAL | Status: DC
  Administered 2012-10-18 – 2012-10-19 (×2): 40 mg via ORAL
  Filled 2012-10-18 (×4): qty 1

## 2012-10-18 NOTE — [facility] Suicide Risk Assessment (Signed)
Suicide Risk Assessment  Admission Assessment     Nursing information obtained from:  Patient Demographic factors:  Male;Unemployed Current Mental Status:  NA Loss Factors:  Decline in physical health;Financial problems / change in socioeconomic status Dead daughter's  birthday Historical Factors:  Prior suicide attempts Risk Reduction Factors:  Positive social support  CLINICAL FACTORS:   Bipolar Disorder:   Depressive phase Alcohol/Substance Abuse/Dependencies More mood flucutatins COGNITIVE FEATURES THAT CONTRIBUTE TO RISK:  Closed-mindedness Polarized thinking Thought constriction (tunnel vision)    SUICIDE RISK:   Moderate:  Frequent suicidal ideation with limited intensity, and duration, some specificity in terms of plans, no associated intent, good self-control, limited dysphoria/symptomatology, some risk factors present, and identifiable protective factors, including available and accessible social support.  PLAN OF CARE: Supportive approach/coping skills/relapse prevention                               Reassess and address the co mrbidities                               Grief work                                Adjust his medications: Increase the Abilify to 25 mg                                                                         Neurontin 400 mg TID                                                                         Celexa 30 mg  I certify that inpatient services furnished can reasonably be expected to improve the patient's condition.  Doreather Hoxworth A 10/18/2012, 1:51 PM

## 2012-10-18 NOTE — Tx Team (Signed)
Interdisciplinary Treatment Plan Update   Date Reviewed:  10/18/2012  Time Reviewed:  8:38 AM  Progress in Treatment:   Attending groups: Yes Participating in groups: Yes Taking medication as prescribed: Yes  Tolerating medication: Yes Family/Significant other contact made: No, but will ask patient for consent for collateral contact Patient understands diagnosis: Yes  Discussing patient identified problems/goals with staff: Yes Medical problems stabilized or resolved: Yes Denies suicidal/homicidal ideation: Yes Patient has not harmed self or others: Yes  For review of initial/current patient goals, please see plan of care.  Estimated Length of Stay:    Reasons for Continued Hospitalization:  Anxiety Depression Medication stabilization Suicidal ideation  New Problems/Goals identified:    Discharge Plan or Barriers:   Home with outpatient follow up  Additional Comments:  Attendees:  Patient:  10/18/2012 8:38 AM   Signature:  10/18/2012 8:38 AM  Signature: 10/18/2012 8:38 AM  Signature: Harold Barban, RN 10/18/2012 8:38 AM  Signature:Beverly Terrilee Croak, RN 10/18/2012 8:38 AM  Signature:   10/18/2012 8:38 AM  Signature:  Juline Patch, LCSW 10/18/2012 8:38 AM  Signature:  Reyes Ivan, LCSW 10/18/2012 8:38 AM  Signature:  Sharin Grave Coordinator 10/18/2012 8:38 AM  Signature: Fransisca Kaufmann, [facility] 10/18/2012 8:38 AM  Signature:    Signature:    Signature:      Scribe for Treatment Team:   Juline Patch,  10/18/2012 8:38 AM

## 2012-10-18 NOTE — Progress Notes (Addendum)
D) Pt rates his depression and hopelessness at an 8. States he denies SI presently due to being in the hospital and feeling safe. If Pt was outside of the hospital states he would not be safe.Is attending the program and interacting with his peers. Denies HI. Interacting with select peers. Attending the program. Affect is flat and mood depressed. A) Given support, reassurance and praise. Encouraged to go to all his groups.  R) Pt denies SI while on the unit.

## 2012-10-18 NOTE — H&P (Signed)
Psychiatric Admission Assessment Adult  Patient Identification:  [name] Date of Evaluation:  10/18/2012 Chief Complaint:  MDD History of Present Illness: [name] is an 52 y.o. male. Pt expresses vague SI with no plan or intent currently but has reported desire to die due to anniversary of daughter's death. Pt also expresses HI but not towards anyone in particular. Pt has hx of HI and SI and in some cases AVH but not today. Pt also has SA issues. He reports relapsing on alcohol and crack due to feeling sad about the loss of his daughter. Patient reports that his drug use made his feel sick physically, which worsened his depression. He reports passive SI with no plan but reports in the past has "Put a gun in my mouth but did not pull the trigger." Patient is very brief in his responses but becomes somewhat irritable when asked to further discuss his stressors. He is able to contract for his safety in the hospital.   Elements:  Location:  [facility] in-patient. Quality:  Worsening depression. Severity:  SI. Timing:  Last week. Duration:  Chronic. Context:  depression related to loss. 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  Psychiatric Specialty Exam: Physical Exam-Findings from the ED reviewed  ROS  Blood pressure 139/95, pulse 94, temperature 99.2 F (37.3 C), temperature source Oral, resp. rate 20, height 5\' 7"  (1.702 m), weight 111.585 kg (246 lb).Body mass index is 38.52 kg/(m^2).  General Appearance: Casual  Eye Contact::  Good  Speech:  Clear and Coherent  Volume:  Decreased  Mood:  Depressed,  Dysphoric and Hopeless  Affect:  Flat  Thought Process:  Circumstantial  Orientation:  Full (Time, Place, and Person)  Thought Content:  Rumination  Suicidal Thoughts:  Yes.  without intent/plan  Homicidal Thoughts:  No  Memory:  Immediate;   Good Recent;   Good Remote;   Good  Judgement:  Impaired  Insight:  Shallow  Psychomotor Activity:  Normal  Concentration:  Fair  Recall:  Fair  Akathisia:  No  Handed:  Right  AIMS (if indicated):     Assets:  Communication Skills Desire for Improvement Physical Health Resilience Social Support  Sleep:  Number of Hours: 3.25    Past Psychiatric History:Yes Diagnosis:Bipolar  Hospitalizations:Multiple at [facility], [facility]  Outpatient Care: Denies  Substance Abuse Care:[facility], rehab at the [name] in the past  Self-Mutilation:Denies  Suicidal Attempts: Has jumped in front of a care before  Violent Behaviors:Denies   Past Medical History:   Past Medical History  Diagnosis Date  . Arthritis   . Hypertension   . Anxiety   . Bipolar affective disorder   . Depression   . Alcohol abuse   . Cocaine abuse     smokes crack-cocaine  . GERD (gastroesophageal reflux disease)   . Chronic back pain   . Tobacco abuse   . Hepatitis C   . Vitamin D deficiency   . Gout   . Hemorrhoids   . Diabetes mellitus without complication    None. Allergies:  No Known Allergies PTA Medications: Prescriptions prior to admission  Medication Sig Dispense Refill  . ARIPiprazole (ABILIFY) 20 MG tablet Take 20 mg by mouth  daily.      . aspirin EC 81 MG tablet Take 81 mg by mouth daily.      . benztropine (COGENTIN) 2 MG tablet Take 2 mg by mouth 2 (two) times daily.      . capsaicin (ZOSTRIX) 0.025 % cream Apply 1 application topically as needed (to feet for burning pain.).      Marland Kitchen cetirizine (ZYRTEC) 10 MG tablet Take 10 mg by mouth daily.      . citalopram (CELEXA) 40 MG tablet Take 20 mg by mouth daily.      Marland Kitchen EPINEPHrine (EPIPEN 2-PAK IJ) Inject  as directed.      . fish oil-omega-3 fatty acids 1000 MG capsule Take 2 g by mouth daily.      Marland Kitchen gabapentin (NEURONTIN) 300 MG capsule Take 1 capsule (300 mg total) by mouth 3 (three) times daily. For anxiety symptoms/pain control  90 capsule  0  . HYDROcodone-acetaminophen (NORCO/VICODIN) 5-325 MG per tablet Take 1 tablet by mouth every 8 (eight) hours.      . hydrocortisone (ANUSOL-HC) 2.5 % rectal cream Place 1 application rectally 2 (two) times daily.      . hydrocortisone (ANUSOL-HC) 25 MG suppository Place 25 mg rectally 2 (two) times daily.      . hydrOXYzine (ATARAX/VISTARIL) 50 MG tablet Take 150 mg by mouth at bedtime.      . indomethacin (INDOCIN) 50 MG capsule Take 50 mg by mouth 4 (four) times daily.      Marland Kitchen lisinopril (PRINIVIL,ZESTRIL) 40 MG tablet Take 40 mg by mouth daily.      . methocarbamol (ROBAXIN) 750 MG tablet Take 750 mg by mouth 3 (three) times daily as needed (muscle spasms.).      Marland Kitchen metoprolol succinate (TOPROL-XL) 50 MG 24 hr tablet Take 25 mg by mouth daily. Take with or immediately following a meal.      . omeprazole (PRILOSEC) 20 MG capsule Take 40 mg by mouth daily.      . salsalate (DISALCID) 750 MG tablet Take 750 mg by mouth 2 (two) times daily.      . Vitamin D, Ergocalciferol, (DRISDOL) 50000 UNITS CAPS Take 50,000 Units by mouth every 7 (seven) days.      Marland Kitchen zolpidem (AMBIEN) 10 MG tablet Take 5 mg by mouth at bedtime as needed for sleep.        Previous Psychotropic Medications:  Medication/Dose-Multiple  Lithium   Tegretol  Depakote           Substance Abuse History in the last 12 months:  yes  Consequences of Substance Abuse: Negative  Social History:  reports that he has been smoking Cigarettes.  He has been smoking about 0.25 packs per day. He does not have any smokeless tobacco history on file. He reports that  drinks alcohol. He reports that he uses illicit drugs (Marijuana and Cocaine). Additional Social History:                       Current Place of Residence:   Place of Birth:   Family Members: Marital Status:  Single Children:  Sons:  Daughters: Relationships: Education:  Corporate treasurer Problems/Performance: Religious Beliefs/Practices: History of Abuse (Emotional/Phsycial/Sexual) Armed forces technical officer; Hotel manager History:  [employer] Legal History: Hobbies/Interests:  Family History:   Family History  Problem Relation Age of Onset  . Hypertension Brother   . Diabetes Brother   . Hypertension Mother   . Diabetes Mother   . Arthritis Mother   .  Other Sister     bone disease    Results for orders placed during the hospital encounter of 10/18/12 (from the past 72 hour(s))  GLUCOSE, CAPILLARY     Status: Abnormal   Collection Time    10/18/12 11:45 AM      Result Value Range   Glucose-Capillary 110 (*) 70 - 99 mg/dL   Psychological Evaluations:  Assessment:   AXIS I:  Bipolar, Manic AXIS II:  Deferred AXIS III:   Past Medical History  Diagnosis Date  . Arthritis   . Hypertension   . Anxiety   . Bipolar affective disorder   . Depression   . Alcohol abuse   . Cocaine abuse     smokes crack-cocaine  . GERD (gastroesophageal reflux disease)   . Chronic back pain   . Tobacco abuse   . Hepatitis C   . Vitamin D deficiency   . Gout   . Hemorrhoids   . Diabetes mellitus without complication    AXIS IV:  other psychosocial or environmental problems, problems related to social environment and problems with primary support group AXIS V:  41-50 serious symptoms   Treatment Plan/Recommendations:   1. Admit for crisis management and stabilization. Estimated length of stay 5-7 days. 2. Medication management to reduce current symptoms to base line and improve the patient's level of functioning. Increase Abilify to 25 mg daily. Start Neurontin 400 mg po TID and Celexa to 30 mg to help with depression and mood stability. Vistaril 150 mg initiated to help improve sleep. 3. Develop  treatment plan to decrease risk of relapse upon discharge of depressive symptoms and the need for readmission. 5. Group therapy to facilitate development of healthy coping skills to use for depression and anxiety. 6. Health care follow up as needed for medical problems.  7. Discharge plan to include therapy to help patient cope with stressors.  8. Call for Consult with Hospitalist for additional specialty patient services as needed.   Treatment Plan Summary: Daily contact with patient to assess and evaluate symptoms and progress in treatment Medication management Supportive approach/coping skills/relapse prevention Reasses and Address the  co morbidities. Optimize treatment with psychotropics Current Medications:  Current Facility-Administered Medications  Medication Dose Route Frequency Provider Last Rate Last Dose  . acetaminophen (TYLENOL) tablet 650 mg  650 mg Oral Q6H PRN Kerry Hough, PA-C      . alum & mag hydroxide-simeth (MAALOX/MYLANTA) 200-200-20 MG/5ML suspension 30 mL  30 mL Oral Q4H PRN Kerry Hough, PA-C      . ARIPiprazole (ABILIFY) tablet 20 mg  20 mg Oral Daily Kerry Hough, PA-C   20 mg at 10/18/12 1610  . aspirin EC tablet 81 mg  81 mg Oral Daily Kerry Hough, PA-C   81 mg at 10/18/12 9604  . benztropine (COGENTIN) tablet 2 mg  2 mg Oral BID Kerry Hough, PA-C   2 mg at 10/18/12 5409  . citalopram (CELEXA) tablet 20 mg  20 mg Oral Daily Kerry Hough, PA-C   20 mg at 10/18/12 8119  . gabapentin (NEURONTIN) capsule 300 mg  300 mg Oral TID Kerry Hough, PA-C   300 mg at 10/18/12 1211  . hydrOXYzine (ATARAX/VISTARIL) tablet 150 mg  150 mg Oral QHS Kerry Hough, PA-C      . indomethacin (INDOCIN) capsule 50 mg  50 mg Oral QID Kerry Hough, PA-C   50 mg at 10/18/12 1211  . lisinopril (PRINIVIL,ZESTRIL) tablet 40  mg  40 mg Oral Daily Kerry Hough, PA-C   40 mg at 10/18/12 1191  . loratadine (CLARITIN) tablet 10 mg  10 mg Oral Daily Kerry Hough,  PA-C   10 mg at 10/18/12 4782  . magnesium hydroxide (MILK OF MAGNESIA) suspension 30 mL  30 mL Oral Daily PRN Kerry Hough, PA-C      . methocarbamol (ROBAXIN) tablet 750 mg  750 mg Oral TID PRN Kerry Hough, PA-C      . metoprolol succinate (TOPROL-XL) 24 hr tablet 25 mg  25 mg Oral Daily Kerry Hough, PA-C   25 mg at 10/18/12 0827  . pantoprazole (PROTONIX) EC tablet 40 mg  40 mg Oral Daily Kerry Hough, PA-C   40 mg at 10/18/12 9562  . Vitamin D (Ergocalciferol) (DRISDOL) capsule 50,000 Units  50,000 Units Oral Q7 days Kerry Hough, PA-C   50,000 Units at 10/18/12 1211    Observation Level/Precautions:  15 minute checks  Laboratory:  CBC Chemistry Profile  Psychotherapy:  Group Sessions  Medications:  See list  Consultations:  As needed  Discharge Concerns:  Safety and Stability  Estimated LOS: 5-7 days  Other:     I certify that inpatient services furnished can reasonably be expected to improve the patient's condition.   Fransisca Kaufmann NP-C 7/11/201412:23 PM

## 2012-10-18 NOTE — Tx Team (Signed)
Initial Interdisciplinary Treatment Plan  PATIENT STRENGTHS: (choose at least two) General fund of knowledge Supportive family/friends  PATIENT STRESSORS: Financial difficulties Health problems Medication change or noncompliance Substance abuse   PROBLEM LIST: Problem List/Patient Goals Date to be addressed Date deferred Reason deferred Estimated date of resolution  SI 10/18/12     Depression 10/18/12     Anxiety 10/18/12     Risk for suicide 10/18/12                                    DISCHARGE CRITERIA:  Ability to meet basic life and health needs Improved stabilization in mood, thinking, and/or behavior Motivation to continue treatment in a less acute level of care  PRELIMINARY DISCHARGE PLAN: Attend aftercare/continuing care group  PATIENT/FAMIILY INVOLVEMENT: This treatment plan has been presented to and reviewed with the patient, [name].  The patient and family have been given the opportunity to ask questions and make suggestions.  Jacques Navy A 10/18/2012, 2:27 AM

## 2012-10-18 NOTE — [facility] Group Notes (Signed)
[facility] LCSW Aftercare Discharge Planning Group Note   10/18/2012 11:19 AM  Participation Quality:  Appropriate  Mood/Affect:  Appropriate, Depressed and Flat  Depression Rating:  5  Anxiety Rating:  5  Thoughts of Suicide:  Yes  Will you contract for safety?   Yes  Current AVH:  No  Plan for Discharge/Comments:  Patient reports admitting to hospital with SI/HI.  He shared stressors are brithday of three month old daughter who died of SID two years ago and death of fiance a year ago.  He advised of being followed outpatient by [facility].  He is interested in referral for grief counseling.  Transportation Means: Patient uses public transportation.   Supports:  Patient has limited support system.   Anandi Abramo, Joesph July

## 2012-10-18 NOTE — Progress Notes (Signed)
Patient ID: [name], male   DOB: 1961/03/18, 52 y.o.   MRN: [id number]  Admission Note:  D:52 yr male who presents IVC in no acute distress for the treatment of SI and Depression. Pt appears flat and depressed. Pt was calm and cooperative with admission process.Pt denies SI/HI/AVH at this time. Pt has Past medical Hx of Hep C, Bi-polar, GERD, Gout, Asthma, Depression, and DM without complication . Pt states he went on a Cocaine "crack" and ETOH binge and was feeling SI with a plan to shoot himself about 5 days ago, which is around the 3 year anniversary of daughters death from SIDS.  A: Skin was assessed and found to be clear of any abnormal marks apart from a scars on L-arm and scars on L-leg . POC and unit policies explained and understanding verbalized. Consents obtained. Food and fluids offered, and fluids accepted.  R: Pt had no additional questions or concerns.

## 2012-10-18 NOTE — Progress Notes (Deleted)
Patient ID: [name], male   DOB: July 08, 1960, 52 y.o.   MRN: [id number]  Admission Note:  D:52 yr male who presents IVC in no acute distress for the treatment of SI and Depression. Pt appears flat and depressed. Pt was calm and cooperative with admission process.Pt denies SI/HI/AVH at this time. Pt has Past medical Hx of Hep C, Bi-polar, GERD, Gout, Asthma, Depression, and DM without complication . Pt states he went on a Cocaine "crack" and ETOH binge and was feeling SI with a plan to shoot himself about 5 days ago, which is around the anniversary of daughters death.    A: Skin was assessed and found to be clear of any abnormal marks apart from a scars on L-arm and scars on L-leg . POC and unit policies explained and understanding verbalized. Consents obtained. Food and fluids offered, and fluids accepted.  R: Pt had no additional questions or concerns.

## 2012-10-18 NOTE — [facility] Group Notes (Signed)
[facility] LCSW Group Therapy  Feelings Around Relapse 1:15 -2:30        10/18/2012  11:30 AM   Type of Therapy:  Group Therapy  Participation Level:  Appropriate  Participation Quality:  Appropriate  Affect:  Appropriate  Cognitive:  Attentive Appropriate  Insight:  Engaged  Engagement in Therapy:  Engaged  Modes of Intervention:  Discussion Exploration Problem-Solving Supportive  Summary of Progress/Problems:  The topic for today was feelings around relapse.    Patient processed feelings toward relapse and was able to relate to peers. Patient identified coping skills that can be used to prevent a relapse.  He shared relapse for him is returning to drug use.  He stated he started planning his relapse on last discharge due to being angry because he had to leave the hospital.   [name] 10/18/2012 11:30 AM

## 2012-10-18 NOTE — Tx Team (Signed)
Interdisciplinary Treatment Plan Update   Date Reviewed:  10/18/2012  Time Reviewed:  11:16 AM  Progress in Treatment:   Attending groups: Yes Participating in groups: Yes Taking medication as prescribed: Yes  Tolerating medication: Yes Family/Significant other contact made: No, but will ask patient for consent for collateral contact Patient understands diagnosis: Yes  Discussing patient identified problems/goals with staff: Yes Medical problems stabilized or resolved: Patient being evaluated for medication needs. Denies suicidal/homicidal ideation: No, but contracts for safety Patient has not harmed self or others: Yes  For review of initial/current patient goals, please see plan of care.  Estimated Length of Stay:  3-5 days  Reasons for Continued Hospitalization:  Anxiety Depression Medication stabilization Suicidal ideation Homicidal Ideation  (No specific person identified)  New Problems/Goals identified:    Discharge Plan or Barriers:   Home with outpatient follow up  Additional Comments: N/A  Attendees:  Patient:  10/18/2012 11:16 AM   Signature:Chris Sharee Pimple, RN  10/18/2012 11:16 AM  Signature: 10/18/2012 11:16 AM  Signature:  10/18/2012 11:16 AM  Signature 10/18/2012 11:16 AM  Signature:  10/18/2012 11:16 AM  Signature:  Juline Patch, LCSW 10/18/2012 11:16 AM  Signature:  Reyes Ivan, LCSW 10/18/2012 11:16 AM  Signature:  Maseta Dorley,Care Coordinator 10/18/2012 11:16 AM  Signature: Fransisca Kaufmann, [facility] 10/18/2012 11:16 AM  Signature:    Signature:    Signature:      Scribe for Treatment Team:   Juline Patch,  10/18/2012 11:16 AM

## 2012-10-18 NOTE — Progress Notes (Signed)
Adult Psychoeducational Group Note  Date:  10/18/2012 Time:  12:37 PM  Group Topic/Focus:  Relapse Prevention Planning:   The focus of this group is to define relapse and discuss the need for planning to combat relapse.  Participation Level:  Did Not Attend  Participation Quality:  Did not Attend  Affect:  Did not Attend  Cognitive:  Did not Attend  Insight: None  Engagement in Group:  Did not Attend  Modes of Intervention:  Did not Attend  Additional Comments:    Cathlean Cower 10/18/2012, 12:37 PM

## 2012-10-18 NOTE — [facility] Counselor (Signed)
Adult Psychosocial Assessment Update Interdisciplinary Team  Previous [facility] admissions/discharges:  Admissions Discharges  Date: July 18, 2012 Date:  July 23, 2012  Date: Date:  Date: Date:  Date: Date:  Date: Date:   Changes since the last Psychosocial Assessment (including adherence to outpatient mental health and/or substance abuse treatment, situational issues contributing to decompensation and/or relapse). Patient advised of increased depression to deceased daughter's approaching birthday and relapsing on alcohol/drugs.  He also shared his fiance died last  Year.             Discharge Plan 1. Will you be returning to the same living situation after discharge?   Yes: No:      If no, what is your plan?    Patient reports having a place to stay while exploring permanent housing.  He shared he recently received housing assistance through [name].       2. Would you like a referral for services when you are discharged? Yes:     If yes, for what services?  No:       Yes.  Patient is [facility] but wants to be referred for grief counseling due to unresolved grief.       Summary and Recommendations (to be completed by the evaluator) [name] is a 52 years old African American male admitted with Bipolar Disorder.  He will benefit from crisis stabilization, evaluation for medication, psycho-education groups for coping skills development, group therapy and case management for discharge planning.                        Signature:  [name], 10/18/2012 11:21 AM

## 2012-10-19 DIAGNOSIS — F3110 Bipolar disorder, current episode manic without psychotic features, unspecified: Secondary | ICD-10-CM

## 2012-10-19 LAB — GLUCOSE, CAPILLARY
Glucose-Capillary: 88 mg/dL (ref 70–99)
Glucose-Capillary: 126 mg/dL — ABNORMAL HIGH (ref 70–99)
Glucose-Capillary: 106 mg/dL — ABNORMAL HIGH (ref 70–99)

## 2012-10-19 MED ORDER — PANTOPRAZOLE SODIUM 40 MG PO TBEC
40.0000 mg | DELAYED_RELEASE_TABLET | Freq: Two times a day (BID) | ORAL | Status: DC
  Administered 2012-10-19 – 2012-10-21 (×4): 40 mg via ORAL
  Filled 2012-10-19 (×2): qty 1
  Filled 2012-10-19: qty 6
  Filled 2012-10-19 (×5): qty 1
  Filled 2012-10-19: qty 6
  Filled 2012-10-19: qty 1

## 2012-10-19 MED ORDER — NAPROXEN 500 MG PO TABS: 500.0000 mg | ORAL_TABLET | Freq: Two times a day (BID) | ORAL | Status: DC

## 2012-10-19 MED ORDER — HYDROCORTISONE 2.5 % RE CREA
TOPICAL_CREAM | Freq: Two times a day (BID) | RECTAL | Status: DC | PRN
  Administered 2012-10-19: 23:00:00 via RECTAL
  Filled 2012-10-19: qty 28.35

## 2012-10-19 MED ORDER — TRAMADOL HCL 50 MG PO TABS
50.0000 mg | ORAL_TABLET | Freq: Four times a day (QID) | ORAL | Status: DC | PRN
Start: 2012-10-19 — End: 2012-10-21
  Administered 2012-10-20 – 2012-10-21 (×2): 50 mg via ORAL
  Filled 2012-10-19 (×2): qty 1

## 2012-10-19 NOTE — Progress Notes (Signed)
The focus of this group is to help patients establish daily goals to achieve during treatment and discuss how the patient can incorporate goal setting into their daily lives to aide in recovery.  The theme for today is healthy coping skills. Pt stated that his goal for today is to enjoy life, and take risks to stay healthy. Pt was encouraged to live in the present.

## 2012-10-19 NOTE — Progress Notes (Signed)
Patient ID: [name], male   DOB: March 06, 1961, 52 y.o.   MRN: [id number]  D:  Pt +ve for HI, but contracts for safety. Pt states he feels better than he did yesterday. Pt denies SI/AVH. Pt is pleasant and cooperative.   A: Pt was offered support and encouragement. Pt was given scheduled medications. Pt was encourage to attend groups. Q 15 minute checks were done for safety.   R:Pt attends groups and interacts well with peers and staff. Pt is taking medication. Pt has no complaints at this time.Pt receptive to treatment and safety maintained on unit.

## 2012-10-19 NOTE — Progress Notes (Signed)
[facility] MD Progress Note  10/19/2012 12:10 PM [name]  MRN:  [id number] Subjective:   Patient focused on somatic concerns this morning and discusses this more than his psychiatric symptoms. When asked about HI/SI patient becomes anxious stating "I always have those thoughts." He rates his depression at seven today. Patient feels the groups are helping him express his anger in a positive way. The patient feels that he has no yet had time to respond to the medication changes that were made yesterday.   Diagnosis:   Axis I: Bipolar, Manic Axis II: Deferred Axis III:  Past Medical History  Diagnosis Date  . Arthritis   . Hypertension   . Anxiety   . Bipolar affective disorder   . Depression   . Alcohol abuse   . Cocaine abuse     smokes crack-cocaine  . GERD (gastroesophageal reflux disease)   . Chronic back pain   . Tobacco abuse   . Hepatitis C   . Vitamin D deficiency   . Gout   . Hemorrhoids   . Diabetes mellitus without complication    Axis IV: other psychosocial or environmental problems, problems related to social environment and problems with primary support group Axis V: 51-60 moderate symptoms  ADL's:  Intact  Sleep: Good  Appetite:  Fair  Suicidal Ideation:  Passive SI no plan currently Homicidal Ideation:  Passive to "people who have hurt me" but denies serious intent AEB (as evidenced by):  Psychiatric Specialty Exam: Review of Systems  Constitutional: Negative.   HENT: Negative.   Eyes: Negative.   Respiratory: Negative.   Cardiovascular: Negative.   Gastrointestinal: Positive for heartburn.  Genitourinary: Negative.   Musculoskeletal: Positive for back pain.  Skin: Positive for itching (Patient reports having itching from his hemorrhoids. ).  Neurological: Negative.   Endo/Heme/Allergies: Negative.   Psychiatric/Behavioral: Positive for depression, suicidal ideas and substance abuse. Negative for hallucinations and memory loss. The patient is  nervous/anxious. The patient does not have insomnia.     Blood pressure 151/88, pulse 101, temperature 99.2 F (37.3 C), temperature source Oral, resp. rate 18, height 5\' 7"  (1.702 m), weight 111.585 kg (246 lb).Body mass index is 38.52 kg/(m^2).  General Appearance: Disheveled  Eye Solicitor::  Fair  Speech:  Clear and Coherent  Volume:  Normal  Mood:  Angry, Dysphoric and Irritable  Affect:  Blunt and Flat  Thought Process:  Irrelevant  Orientation:  Full (Time, Place, and Person)  Thought Content:  Rumination  Suicidal Thoughts:  Yes.  without intent/plan  Homicidal Thoughts:  Yes.  without intent/plan  Memory:  Immediate;   Good Recent;   Good Remote;   Good  Judgement:  Impaired  Insight:  Shallow  Psychomotor Activity:  Normal  Concentration:  Fair  Recall:  Fair  Akathisia:  No  Handed:  Right  AIMS (if indicated):     Assets:  Communication Skills Desire for Improvement Leisure Time Physical Health Resilience Social Support Vocational/Educational  Sleep:  Number of Hours: 6.25   Current Medications: Current Facility-Administered Medications  Medication Dose Route Frequency Provider Last Rate Last Dose  . acetaminophen (TYLENOL) tablet 650 mg  650 mg Oral Q6H PRN Kerry Hough, PA-C   650 mg at 10/18/12 1842  . alum & mag hydroxide-simeth (MAALOX/MYLANTA) 200-200-20 MG/5ML suspension 30 mL  30 mL Oral Q4H PRN Kerry Hough, PA-C   30 mL at 10/18/12 2109  . ARIPiprazole (ABILIFY) tablet 25 mg  25 mg Oral Daily Madie Reno  Jorja Loa, MD   25 mg at 10/19/12 0811  . aspirin EC tablet 81 mg  81 mg Oral Daily Kerry Hough, PA-C   81 mg at 10/19/12 4098  . benztropine (COGENTIN) tablet 2 mg  2 mg Oral BID Kerry Hough, PA-C   2 mg at 10/19/12 1191  . citalopram (CELEXA) tablet 30 mg  30 mg Oral Daily Rachael Fee, MD   30 mg at 10/19/12 0810  . gabapentin (NEURONTIN) capsule 400 mg  400 mg Oral TID Rachael Fee, MD   400 mg at 10/19/12 0811  . hydrocortisone (ANUSOL-HC)  2.5 % rectal cream   Rectal BID PRN Fransisca Kaufmann, NP      . hydrOXYzine (ATARAX/VISTARIL) tablet 150 mg  150 mg Oral QHS Kerry Hough, PA-C   150 mg at 10/18/12 2238  . indomethacin (INDOCIN) capsule 50 mg  50 mg Oral QID Kerry Hough, PA-C   50 mg at 10/19/12 4782  . lisinopril (PRINIVIL,ZESTRIL) tablet 40 mg  40 mg Oral Daily Kerry Hough, PA-C   40 mg at 10/19/12 0811  . loratadine (CLARITIN) tablet 10 mg  10 mg Oral Daily Kerry Hough, PA-C   10 mg at 10/19/12 9562  . magnesium hydroxide (MILK OF MAGNESIA) suspension 30 mL  30 mL Oral Daily PRN Kerry Hough, PA-C      . methocarbamol (ROBAXIN) tablet 750 mg  750 mg Oral TID PRN Kerry Hough, PA-C      . metoprolol succinate (TOPROL-XL) 24 hr tablet 25 mg  25 mg Oral Daily Kerry Hough, PA-C   25 mg at 10/19/12 0810  . pantoprazole (PROTONIX) EC tablet 40 mg  40 mg Oral BID Fransisca Kaufmann, NP      . Vitamin D (Ergocalciferol) (DRISDOL) capsule 50,000 Units  50,000 Units Oral Q7 days Kerry Hough, PA-C   50,000 Units at 10/18/12 1211    Lab Results:  Results for orders placed during the hospital encounter of 10/18/12 (from the past 48 hour(s))  GLUCOSE, CAPILLARY     Status: Abnormal   Collection Time    10/18/12 11:45 AM      Result Value Range   Glucose-Capillary 110 (*) 70 - 99 mg/dL  GLUCOSE, CAPILLARY     Status: Abnormal   Collection Time    10/18/12  4:59 PM      Result Value Range   Glucose-Capillary 103 (*) 70 - 99 mg/dL    Physical Findings: AIMS: Facial and Oral Movements Muscles of Facial Expression: None, normal Lips and Perioral Area: None, normal Jaw: None, normal Tongue: None, normal,Extremity Movements Upper (arms, wrists, hands, fingers): None, normal Lower (legs, knees, ankles, toes): None, normal, Trunk Movements Neck, shoulders, hips: None, normal, Overall Severity Severity of abnormal movements (highest score from questions above): None, normal Incapacitation due to abnormal movements:  None, normal Patient's awareness of abnormal movements (rate only patient's report): No Awareness, Dental Status Current problems with teeth and/or dentures?: No Does patient usually wear dentures?: No  CIWA:  CIWA-Ar Total: 4 COWS:  COWS Total Score: 2  Treatment Plan Summary: Daily contact with patient to assess and evaluate symptoms and progress in treatment Medication management  Plan: Continue crisis management and stabilization.  Medication management: Reviewed with patient who stated no untoward effects. Recent increase to Celexa and Abilify yesterday. Started Neurontin 400 mg also.  Encouraged patient to attend groups and participate in group counseling sessions and activities.  Discharge  plan in progress.  Address health issues: Vitals reviewed and stable. Patient reports increased indigestion, knee pain, and itching from his hemorrhoids. Increase Protonix 40 mg bid, Naproxen for pain, Anusol rectal cream for itching.  Continue current treatment plan.   Medical Decision Making Problem Points:  Established problem, stable/improving (1) and Review of psycho-social stressors (1) Data Points:  Review of new medications or change in dosage (2)  I certify that inpatient services furnished can reasonably be expected to improve the patient's condition.   Nuri Larmer NP-C 10/19/2012, 12:10 PM

## 2012-10-19 NOTE — Progress Notes (Signed)
D) Pt has attended the groups and interacts with his peers. Affect is flat and mood depressed. Rates his depression at a 7 and his hopelessness at a 5. Continues to have thoughts of SI A) Contract made with Pt. Provided with a 1:1. Given support and reassurance. R) Contracting for safety on the unit.

## 2012-10-19 NOTE — Progress Notes (Signed)
Adult Psychoeducational Group Note  Date:  10/19/2012 Time:  3:43 PM  Group Topic/Focus:  Identifying Needs:   The focus of this group is to help patients identify their personal needs that have been historically problematic and identify healthy behaviors to address their needs.  Participation Level:  Active  Participation Quality:  Appropriate, Sharing and Supportive  Affect:  Appropriate  Cognitive:  Appropriate  Insight: Appropriate  Engagement in Group:  Engaged and Supportive  Modes of Intervention:  Discussion, Education, Role-play, Socialization and Support  Additional Comments:  Patients will increase their understanding of their needs by listing at least 2 personal needs that they see as an issue that brought them into the hospital. Patient will identify at least one benefit and obstacle in continuing to use the same unhealthy behaviors Pt. will identify one healthy behavior to begin using within the first 24 hours of group. Pt attended group. Pt stated his energy level is an 8 out of 10. 1 being the lowest, 10 being the highest. Pt stated two of his needs as love and positive self esteem. Pt stated two of his unhealthy behaviors is withdrawal and being a people pleaser. Pt stated he would work on not letting the words or actions of others affect his feelings.   Caswell Corwin 10/19/2012, 3:43 PM

## 2012-10-19 NOTE — [facility] Group Notes (Signed)
[facility] Group Notes:  (Clinical Social Work)  10/19/2012   3:00-4:00PM  Summary of Progress/Problems:   The main focus of today's process group was for the patient to identify something in their life that led to their hospitalization that they would like to change, then to discuss their motivation to change.  The Stages of Change were explained to the group, then each patient identified where they are in that process.  A scaling question was used with motivation interviewing to determine the patient's current motivation to change the identified behavior (1-10, low to high). The patient expressed that he self-sabotages with his anger and tries to "push the buttons to test other people" as well as isolates, telling himself he does not need anyone.  He does not believe that he deserves the things that have happened to him.  He states his motivation to change is 9 out of 10, because he is tired of living this way.  However, he admitted that he is holding out on that last step from 9 to 10, because he wants to have some freedom in his mind in case he wants to engage in the self-sabotaging behaviors again.  Type of Therapy:  Process Group  Participation Level:  Active  Participation Quality:  Attentive, Inattentive, Redirectable and Sharing  Affect:  Blunted  Cognitive:  Oriented  Insight:  Developing/Improving  Engagement in Therapy:  Engaged  Modes of Intervention:  Education, Motivational Interviewing   Ambrose Mantle, LCSW 10/19/2012, 4:53 PM

## 2012-10-20 LAB — GLUCOSE, CAPILLARY
Glucose-Capillary: 108 mg/dL — ABNORMAL HIGH (ref 70–99)
Glucose-Capillary: 103 mg/dL — ABNORMAL HIGH (ref 70–99)
Glucose-Capillary: 96 mg/dL (ref 70–99)
Glucose-Capillary: 100 mg/dL — ABNORMAL HIGH (ref 70–99)

## 2012-10-20 MED ORDER — PSEUDOEPHEDRINE HCL 30 MG PO TABS: 30.0000 mg | ORAL_TABLET | Freq: Three times a day (TID) | ORAL | Status: DC | PRN

## 2012-10-20 NOTE — Progress Notes (Signed)
[facility] Group Notes:  (Nursing/MHT/Case Management/Adjunct)  Date:  10/19/2012 Time:  2000  Type of Therapy:  Psychoeducational Skills  Participation Level:  Active  Participation Quality:  Attentive  Affect:  Flat  Cognitive:  Disorganized  Insight:  Improving  Engagement in Group:  Engaged  Modes of Intervention:  Education  Summary of Progress/Problems: The patient shared in group this evening that he enjoyed the groups which took place during the morning. He stated that he learned from these groups and still has room to grow. His goal for tomorrow is to "live life to its fullest".   Cinthia Rodden S 10/20/2012, 1:51 AM

## 2012-10-20 NOTE — Progress Notes (Signed)
Adult Psychoeducational Group Note  Date:  10/20/2012 Time:  1:38 PM  Group Topic/Focus:  Making Healthy Choices:   The focus of this group is to help patients identify negative/unhealthy choices they were using prior to admission and identify positive/healthier coping strategies to replace them upon discharge.  Participation Level:  Active  Participation Quality:  Appropriate, Sharing and Supportive  Affect:  Appropriate  Cognitive:  Appropriate  Insight: Appropriate  Engagement in Group:  Engaged, Improving and Supportive  Modes of Intervention:  Discussion, Role-play, Socialization and Support  Additional Comments:  Pt attended group and actively participated. Pt stated his energy level as a 9 out of 10. 1 being the lowest, 10 being the highest.  Everrett Coombe C 10/20/2012, 1:38 PM

## 2012-10-20 NOTE — Progress Notes (Signed)
The focus of this group is to help patients establish daily goals to achieve during treatment and discuss how the patient can incorporate goal setting into their daily lives to aide in recovery.  The topic for this group was three things to be grateful for: my family, BH, everyone here at [facility].

## 2012-10-20 NOTE — Progress Notes (Signed)
Patient ID: [name], male   DOB: 1960-07-16, 52 y.o.   MRN: [id number] [facility] MD Progress Note  10/20/2012 12:22 PM [name]  MRN:  [id number] Subjective:   Patient continues to request medication for medical issues and minimizes any psychiatric symptoms. Denies feeling depressed or suicidal. When asked the patient what changed patient responds "I guess going to the groups. The medication changes. I have a voucher for [employer] 8 to pursue after discharge. I guess I'm just feeling better." Patient is requesting to discharge tomorrow.   Diagnosis:   Axis I: Bipolar, Manic Axis II: Deferred Axis III:  Past Medical History  Diagnosis Date  . Arthritis   . Hypertension   . Anxiety   . Bipolar affective disorder   . Depression   . Alcohol abuse   . Cocaine abuse     smokes crack-cocaine  . GERD (gastroesophageal reflux disease)   . Chronic back pain   . Tobacco abuse   . Hepatitis C   . Vitamin D deficiency   . Gout   . Hemorrhoids   . Diabetes mellitus without complication    Axis IV: other psychosocial or environmental problems, problems related to social environment and problems with primary support group Axis V: 51-60 moderate symptoms  ADL's:  Intact  Sleep: Good  Appetite:  Fair  Suicidal Ideation:  Denies Homicidal Ideation:  Passive to "people who have hurt me" but denies serious intent AEB (as evidenced by):  Psychiatric Specialty Exam: Review of Systems  Constitutional: Negative.   HENT: Positive for congestion.   Eyes: Negative.   Respiratory: Negative.   Cardiovascular: Negative.   Gastrointestinal: Positive for heartburn.  Genitourinary: Negative.   Musculoskeletal: Positive for back pain.  Skin: Negative for itching (Patient reports having itching from his hemorrhoids. ).  Neurological: Negative.   Endo/Heme/Allergies: Negative.   Psychiatric/Behavioral: Positive for depression, suicidal ideas and substance abuse. Negative for hallucinations and  memory loss. The patient is nervous/anxious. The patient does not have insomnia.     Blood pressure 148/78, pulse 102, temperature 98.1 F (36.7 C), temperature source Oral, resp. rate 16, height 5\' 7"  (1.702 m), weight 111.585 kg (246 lb).Body mass index is 38.52 kg/(m^2).  General Appearance: Disheveled  Eye Solicitor::  Fair  Speech:  Clear and Coherent  Volume:  Normal  Mood:  Angry, Dysphoric and Irritable  Affect:  Blunt and Flat  Thought Process:  Irrelevant  Orientation:  Full (Time, Place, and Person)  Thought Content:  Rumination  Suicidal Thoughts:  Yes.  without intent/plan  Homicidal Thoughts:  Yes.  without intent/plan  Memory:  Immediate;   Good Recent;   Good Remote;   Good  Judgement:  Impaired  Insight:  Shallow  Psychomotor Activity:  Normal  Concentration:  Fair  Recall:  Fair  Akathisia:  No  Handed:  Right  AIMS (if indicated):     Assets:  Communication Skills Desire for Improvement Leisure Time Physical Health Resilience Social Support Vocational/Educational  Sleep:  Number of Hours: 5.25   Current Medications: Current Facility-Administered Medications  Medication Dose Route Frequency Provider Last Rate Last Dose  . acetaminophen (TYLENOL) tablet 650 mg  650 mg Oral Q6H PRN Kerry Hough, PA-C   650 mg at 10/18/12 1842  . alum & mag hydroxide-simeth (MAALOX/MYLANTA) 200-200-20 MG/5ML suspension 30 mL  30 mL Oral Q4H PRN Kerry Hough, PA-C   30 mL at 10/18/12 2109  . ARIPiprazole (ABILIFY) tablet 25 mg  25 mg  Oral Daily Rachael Fee, MD   25 mg at 10/20/12 1610  . aspirin EC tablet 81 mg  81 mg Oral Daily Kerry Hough, PA-C   81 mg at 10/20/12 9604  . benztropine (COGENTIN) tablet 2 mg  2 mg Oral BID Kerry Hough, PA-C   2 mg at 10/20/12 0825  . citalopram (CELEXA) tablet 30 mg  30 mg Oral Daily Rachael Fee, MD   30 mg at 10/20/12 0824  . gabapentin (NEURONTIN) capsule 400 mg  400 mg Oral TID Rachael Fee, MD   400 mg at 10/20/12 1208   . hydrocortisone (ANUSOL-HC) 2.5 % rectal cream   Rectal BID PRN Fransisca Kaufmann, NP      . hydrOXYzine (ATARAX/VISTARIL) tablet 150 mg  150 mg Oral QHS Kerry Hough, PA-C   150 mg at 10/19/12 2237  . indomethacin (INDOCIN) capsule 50 mg  50 mg Oral QID Kerry Hough, PA-C   50 mg at 10/20/12 1208  . lisinopril (PRINIVIL,ZESTRIL) tablet 40 mg  40 mg Oral Daily Kerry Hough, PA-C   40 mg at 10/20/12 0825  . loratadine (CLARITIN) tablet 10 mg  10 mg Oral Daily Kerry Hough, PA-C   10 mg at 10/20/12 0825  . magnesium hydroxide (MILK OF MAGNESIA) suspension 30 mL  30 mL Oral Daily PRN Kerry Hough, PA-C      . methocarbamol (ROBAXIN) tablet 750 mg  750 mg Oral TID PRN Kerry Hough, PA-C      . metoprolol succinate (TOPROL-XL) 24 hr tablet 25 mg  25 mg Oral Daily Kerry Hough, PA-C   25 mg at 10/20/12 0825  . pantoprazole (PROTONIX) EC tablet 40 mg  40 mg Oral BID Fransisca Kaufmann, NP   40 mg at 10/20/12 0825  . traMADol (ULTRAM) tablet 50 mg  50 mg Oral Q6H PRN Fransisca Kaufmann, NP      . Vitamin D (Ergocalciferol) (DRISDOL) capsule 50,000 Units  50,000 Units Oral Q7 days Kerry Hough, PA-C   50,000 Units at 10/18/12 1211    Lab Results:  Results for orders placed during the hospital encounter of 10/18/12 (from the past 48 hour(s))  GLUCOSE, CAPILLARY     Status: Abnormal   Collection Time    10/18/12  4:59 PM      Result Value Range   Glucose-Capillary 103 (*) 70 - 99 mg/dL  GLUCOSE, CAPILLARY     Status: None   Collection Time    10/19/12 12:11 PM      Result Value Range   Glucose-Capillary 88  70 - 99 mg/dL  GLUCOSE, CAPILLARY     Status: Abnormal   Collection Time    10/19/12  5:12 PM      Result Value Range   Glucose-Capillary 126 (*) 70 - 99 mg/dL  GLUCOSE, CAPILLARY     Status: Abnormal   Collection Time    10/19/12  8:55 PM      Result Value Range   Glucose-Capillary 106 (*) 70 - 99 mg/dL  GLUCOSE, CAPILLARY     Status: Abnormal   Collection Time    10/20/12  6:02  AM      Result Value Range   Glucose-Capillary 108 (*) 70 - 99 mg/dL  GLUCOSE, CAPILLARY     Status: Abnormal   Collection Time    10/20/12 12:12 PM      Result Value Range   Glucose-Capillary 103 (*) 70 - 99 mg/dL  Physical Findings: AIMS: Facial and Oral Movements Muscles of Facial Expression: None, normal Lips and Perioral Area: None, normal Jaw: None, normal Tongue: None, normal,Extremity Movements Upper (arms, wrists, hands, fingers): None, normal Lower (legs, knees, ankles, toes): None, normal, Trunk Movements Neck, shoulders, hips: None, normal, Overall Severity Severity of abnormal movements (highest score from questions above): None, normal Incapacitation due to abnormal movements: None, normal Patient's awareness of abnormal movements (rate only patient's report): No Awareness, Dental Status Current problems with teeth and/or dentures?: No Does patient usually wear dentures?: No  CIWA:  CIWA-Ar Total: 4 COWS:  COWS Total Score: 2  Treatment Plan Summary: Daily contact with patient to assess and evaluate symptoms and progress in treatment Medication management  Plan: Continue crisis management and stabilization.  Medication management: Reviewed with patient who stated no untoward effects.  Encouraged patient to attend groups and participate in group counseling sessions and activities.  Discharge plan in progress. Anticipate d/c tomorrow.  Address health issues: Vitals reviewed and stable. Order sudafed prn for complaints of sinus congestion.   Medical Decision Making Problem Points:  Established problem, stable/improving (1) and Review of psycho-social stressors (1) Data Points:  Review of new medications or change in dosage (2)  I certify that inpatient services furnished can reasonably be expected to improve the patient's condition.   Cherylee Rawlinson NP-C 10/20/2012, 12:22 PM

## 2012-10-20 NOTE — [facility] Group Notes (Signed)
[facility] Group Notes:  (Clinical Social Work)  10/20/2012   3:00-4:00PM  Summary of Progress/Problems:   The main focus of today's process group was to   identify the patient's current support system and decide on other supports that can be put in place.  The picture on workbook was used to discuss why additional supports are needed, and a hand-out was distributed with four definitions/levels of support, then used to talk about how patients have given and received all different kinds of support.  An emphasis was placed on using counselor, doctor, therapy groups, 12-step groups, and problem-specific support groups to expand supports.  The patient stated that the people in his life who love and support him do not understand the fact that he has a mental illness and diabetes.  They appear to him to think that he could just stop this at any time, and they do not understand why he does not choose to do so.  A patient in the group who is very familiar with working with [facility] recommended that he try to suggest they attend a Family-to-Family program.  He stated they would not do so.  CSW suggested how he could ask them to do so out of their love and concern for him, and he stated they work too hard all week long and would not be willing to do this.  After group he asked whether his CSW will be able to help him get a listing of Section 8 available units, because he has a Section 8 voucher, and needs to use it.    Type of Therapy:  Process Group  Participation Level:  Active  Participation Quality:  Attentive and Sharing  Affect:  Blunted  Cognitive:  Oriented  Insight:  Developing/Improving  Engagement in Therapy:  Engaged  Modes of Intervention:  Education,  Support and [employer], LCSW 10/20/2012, 4:50 PM

## 2012-10-20 NOTE — Progress Notes (Signed)
D) Pt rates his depression and hopelessness both at a 6. Admits to thoughts of SI on and off in the last 24 hours. Has been interacting with his peers. Tends to spend a lot of time with certain females on the unit. Limits set with Pt.  A) Given support and reassurance when appropriate. Encouraged to do his packet today  R) Contracts for safety while on the unit.

## 2012-10-20 NOTE — Progress Notes (Signed)
D:  Pt endorses HI but says he won't act on it.  Pt denies SI/AVH. Pt is pleasant and cooperative. Pt states he is ready to go home and find him a place to live.  A: Pt was offered support and encouragement. Pt was given scheduled medications. Pt was encourage to attend groups. Q 15 minute checks were done for safety.   R:Pt attends groups and interacts well with peers and staff. Pt is taking medication. Pt has no complaints at this time.Pt receptive to treatment and safety maintained on unit.

## 2012-10-20 NOTE — ED Provider Notes (Signed)
Medical screening examination/treatment/procedure(s) were performed by non-physician practitioner and as supervising physician I was immediately available for consultation/collaboration.   Johanan Skorupski L Bracken Moffa, MD 10/20/12 1109 

## 2012-10-21 ENCOUNTER — Encounter (HOSPITAL_COMMUNITY): Payer: Self-pay | Admitting: Psychiatry

## 2012-10-21 ENCOUNTER — Encounter: Payer: Self-pay | Admitting: Psychiatry

## 2012-10-21 DIAGNOSIS — F1920 Other psychoactive substance dependence, uncomplicated: Secondary | ICD-10-CM

## 2012-10-21 DIAGNOSIS — F1020 Alcohol dependence, uncomplicated: Secondary | ICD-10-CM

## 2012-10-21 DIAGNOSIS — F3130 Bipolar disorder, current episode depressed, mild or moderate severity, unspecified: Principal | ICD-10-CM

## 2012-10-21 LAB — GLUCOSE, CAPILLARY
Glucose-Capillary: 84 mg/dL (ref 70–99)
Glucose-Capillary: 106 mg/dL — ABNORMAL HIGH (ref 70–99)

## 2012-10-21 MED ORDER — CITALOPRAM HYDROBROMIDE 10 MG PO TABS: 30.0000 mg | ORAL_TABLET | Freq: Every day | ORAL | Status: DC

## 2012-10-21 MED ORDER — LISINOPRIL 40 MG PO TABS: 40.0000 mg | ORAL_TABLET | Freq: Every day | ORAL | Status: DC

## 2012-10-21 MED ORDER — OMEGA-3 FATTY ACIDS 1000 MG PO CAPS: 2.0000 g | ORAL_CAPSULE | Freq: Every day | ORAL | Status: DC

## 2012-10-21 MED ORDER — ASPIRIN EC 81 MG PO TBEC: 81.0000 mg | DELAYED_RELEASE_TABLET | Freq: Every day | ORAL | Status: DC

## 2012-10-21 MED ORDER — HYDROCORTISONE 2.5 % RE CREA: TOPICAL_CREAM | Freq: Two times a day (BID) | RECTAL | Status: DC | PRN

## 2012-10-21 MED ORDER — PANTOPRAZOLE SODIUM 40 MG PO TBEC: 40.0000 mg | DELAYED_RELEASE_TABLET | Freq: Two times a day (BID) | ORAL | Status: DC

## 2012-10-21 MED ORDER — ARIPIPRAZOLE 5 MG PO TABS: 25.0000 mg | ORAL_TABLET | Freq: Every day | ORAL | Status: DC

## 2012-10-21 MED ORDER — GABAPENTIN 400 MG PO CAPS: 400.0000 mg | ORAL_CAPSULE | Freq: Three times a day (TID) | ORAL | Status: DC

## 2012-10-21 MED ORDER — METOPROLOL SUCCINATE ER 50 MG PO TB24
50.0000 mg | ORAL_TABLET | Freq: Every day | ORAL | Status: DC
  Filled 2012-10-21 (×2): qty 3

## 2012-10-21 MED ORDER — INDOMETHACIN 50 MG PO CAPS: 50.0000 mg | ORAL_CAPSULE | Freq: Four times a day (QID) | ORAL | Status: DC

## 2012-10-21 MED ORDER — BENZTROPINE MESYLATE 2 MG PO TABS: 2.0000 mg | ORAL_TABLET | Freq: Two times a day (BID) | ORAL | Status: DC

## 2012-10-21 MED ORDER — VITAMIN D (ERGOCALCIFEROL) 1.25 MG (50000 UNIT) PO CAPS: 50000.0000 [IU] | ORAL_CAPSULE | ORAL | Status: DC

## 2012-10-21 MED ORDER — METHOCARBAMOL 750 MG PO TABS: 750.0000 mg | ORAL_TABLET | Freq: Three times a day (TID) | ORAL | Status: DC | PRN

## 2012-10-21 MED ORDER — HYDROXYZINE HCL 50 MG PO TABS: 150.0000 mg | ORAL_TABLET | Freq: Every day | ORAL | Status: DC

## 2012-10-21 MED ORDER — CETIRIZINE HCL 10 MG PO TABS: 10.0000 mg | ORAL_TABLET | Freq: Every day | ORAL | Status: DC

## 2012-10-21 MED ORDER — METOPROLOL SUCCINATE ER 50 MG PO TB24: 25.0000 mg | ORAL_TABLET | Freq: Every day | ORAL | Status: DC

## 2012-10-21 MED ORDER — CLONIDINE HCL 0.2 MG PO TABS
0.2000 mg | ORAL_TABLET | Freq: Once | ORAL | Status: AC
  Administered 2012-10-21: 0.2 mg via ORAL
  Filled 2012-10-21: qty 1

## 2012-10-21 MED ORDER — HYDROCORTISONE ACETATE 25 MG RE SUPP: 25.0000 mg | Freq: Two times a day (BID) | RECTAL | Status: DC | PRN

## 2012-10-21 NOTE — Progress Notes (Signed)
Grief and Loss Group  Patients processed their emotions and experiences relating to their grief and loss.   Pt shared the impact of the deaths of his father and fiance within a short span of time, including a sense of isolation and pain. Pt shared that the support and care he experienced from other patients in the [facility] had been a positive change and motivation in his life. Pt shared that he is now motivated to make genuine connections with others outside of group. Pt received positive affirmations from other group members and shared his response on the positive influence these affirmations have on him.   Sherol Dade Counselor Intern Haroldine Laws

## 2012-10-21 NOTE — Progress Notes (Signed)
[facility] Group Notes:  (Nursing/MHT/Case Management/Adjunct Date:  10/20/2012 Time:  2000  Type of Therapy:  Psychoeducational Skills  Participation Level:  Active  Participation Quality:  Appropriate  Affect:  Appropriate  Cognitive:  Appropriate  Insight:  Improving  Engagement in Group:  Distracting and Monopolizing  Modes of Intervention:  Education  Summary of Progress/Problems: The patient had to redirected in group for speaking with his peers out of turn. He shared that he had a good day overall and enjoyed the company of his peers. His goal for tomorrow is to speak with the case manager regarding his housing situation.  [name] S 10/21/2012, 3:26 AM

## 2012-10-21 NOTE — [facility] Suicide Risk Assessment (Signed)
Suicide Risk Assessment  Discharge Assessment     Demographic Factors:  Male and Unemployed  Mental Status Per Nursing Assessment::   On Admission:  NA  Current Mental Status by Physician: In full contact with reality. There are no suicidal ideas, plans or intent. States he just had a bad time due to her daughter's birthday, but that she is ready to go.    Loss Factors: Loss of significant relationship  Historical Factors: NA  Risk Reduction Factors:   Living with another person, especially a relative  Continued Clinical Symptoms:  Bipolar Disorder:   Depressive phase Alcohol/Substance Abuse/Dependencies  Cognitive Features That Contribute To Risk:  Closed-mindedness Polarized thinking Thought constriction (tunnel vision)    Suicide Risk:  Minimal: No identifiable suicidal ideation.  Patients presenting with no risk factors but with morbid ruminations; may be classified as minimal risk based on the severity of the depressive symptoms  Discharge Diagnoses:   AXIS I:  Alcohol, cocaine abuse, Bipolar Affective Disorder, Anxiety Disorder nos AXIS II:  Deferred AXIS III:   Past Medical History  Diagnosis Date  . Arthritis   . Hypertension   . Anxiety   . Bipolar affective disorder   . Depression   . Alcohol abuse   . Cocaine abuse     smokes crack-cocaine  . GERD (gastroesophageal reflux disease)   . Chronic back pain   . Tobacco abuse   . Hepatitis C   . Vitamin D deficiency   . Gout   . Hemorrhoids   . Diabetes mellitus without complication    AXIS IV:  other psychosocial or environmental problems AXIS V:  61-70 mild symptoms  Plan Of Care/Follow-up recommendations:  Activity:  as tolerated Diet:  [facility] [place] Is patient on multiple antipsychotic therapies at discharge:  No   Has Patient had three or more failed trials of antipsychotic monotherapy by history:  No  Recommended Plan for Multiple Antipsychotic Therapies: N/A   [name]  A 10/21/2012, 1:04 PM

## 2012-10-21 NOTE — [facility] Suicide Risk Assessment (Signed)
[facility] INPATIENT:  Family/Significant Other Suicide Prevention Education  Suicide Prevention Education:  Patient Refusal for Family/Significant Other Suicide Prevention Education: The patient [name] has refused to provide written consent for family/significant other to be provided Family/Significant Other Suicide Prevention Education during admission and/or prior to discharge.  Physician notified.  Carlito Bogert Hairston 10/21/2012, 11:00 AM

## 2012-10-21 NOTE — [facility] Group Notes (Signed)
[facility] LCSW Aftercare Discharge Planning Group Note   10/21/2012 10:23 AM  Participation Quality:  Appropriate  Mood/Affect:  Appropriate  Depression Rating:  5  Anxiety Rating:  10 (due to wanting to discharge to find permanent housing)  Thoughts of Suicide:  No  Will you contract for safety?  N/A  Current AVH:  No  Plan for Discharge/Comments:  Patient reports being better and wanting to discharge to find permanent housing.    Transportation Means: Patient uses public transportation.  Supports:  Patient has limited support system.   [name], [place] July

## 2012-10-21 NOTE — Discharge Summary (Signed)
Physician Discharge Summary Note  Patient:  [name] is an 52 y.o., male MRN:  [id number] DOB:  12-31-1960 Patient phone:  [phone] (home)  Patient address:   [address] [place] [address],   Date of Admission:  10/18/2012 Date of Discharge: 10/21/2012  Reason for Admission:   Depression with suicidal ideations, substance abuse  Discharge Diagnoses: Active Problems:   Depression   Bipolar affective disorder   Alcohol abuse   Cocaine abuse  Review of Systems  Constitutional: Negative.   HENT: Negative.   Eyes: Negative.   Respiratory: Negative.   Cardiovascular: Negative.   Gastrointestinal: Negative.   Genitourinary: Negative.   Musculoskeletal: Negative.   Skin: Negative.   Neurological: Negative.   Endo/Heme/Allergies: Negative.   Psychiatric/Behavioral: The patient is nervous/anxious.    Axis Diagnosis:   AXIS I:  Bipolar, Depressed; polysubstance dependency, alcohol dependency AXIS II:  Deferred AXIS III:   Past Medical History  Diagnosis Date  . Arthritis   . Hypertension   . Anxiety   . Bipolar affective disorder   . Depression   . Alcohol abuse   . Cocaine abuse     smokes crack-cocaine  . GERD (gastroesophageal reflux disease)   . Chronic back pain   . Tobacco abuse   . Hepatitis C   . Vitamin D deficiency   . Gout   . Hemorrhoids   . Diabetes mellitus without complication    AXIS IV:  economic problems, other psychosocial or environmental problems, problems related to social environment, problems with access to health care services and problems with primary support group AXIS V:  61-70 mild symptoms  Level of Care:  OP  Hospital Course:  On admission:  Patient was depressed with suicidal and homicidal ideations (no one specific, general terms) and substance abuse.  Medications managed:  Home medications continued but narcotics (opiate pain medications replaced with NSAIDs, gabapentin, Ambien, and other narcotic  medications) due to polysubstance dependency.  Abilify increased from 20 mg daily to 25 mg for depression and bipolar disorder, Celexa decreased from 40 mg to 30 mg for depression, and gabapentin increased from 300 mg TID to 400 mg TID for neuropathic pain.  [name] attended and participated in groups and he worked on Pharmacologist and reinforced old ones.  He requested discharge and was mentally and physically stable.  3 days supply of medications given with 30 day scripts, denies active and passive suicidal/homicidal ideations and auditory/visual hallucinations, follow-up appointments encouraged to attend, outside support groups encouraged and information given.  Consults:  None  Significant Diagnostic Studies:  None  Discharge Vitals:   Blood pressure 148/78, pulse 102, temperature 98.1 F (36.7 C), temperature source Oral, resp. rate 16, height 5\' 7"  (1.702 m), weight 111.585 kg (246 lb). Body mass index is 38.52 kg/(m^2). Lab Results:   Results for orders placed during the hospital encounter of 10/18/12 (from the past 72 hour(s))  GLUCOSE, CAPILLARY     Status: Abnormal   Collection Time    10/18/12 11:45 AM      Result Value Range   Glucose-Capillary 110 (*) 70 - 99 mg/dL  GLUCOSE, CAPILLARY     Status: Abnormal   Collection Time    10/18/12  4:59 PM      Result Value Range   Glucose-Capillary 103 (*) 70 - 99 mg/dL  GLUCOSE, CAPILLARY     Status: None   Collection Time    10/19/12 12:11 PM  Result Value Range   Glucose-Capillary 88  70 - 99 mg/dL  GLUCOSE, CAPILLARY     Status: Abnormal   Collection Time    10/19/12  5:12 PM      Result Value Range   Glucose-Capillary 126 (*) 70 - 99 mg/dL  GLUCOSE, CAPILLARY     Status: Abnormal   Collection Time    10/19/12  8:55 PM      Result Value Range   Glucose-Capillary 106 (*) 70 - 99 mg/dL  GLUCOSE, CAPILLARY     Status: Abnormal   Collection Time    10/20/12  6:02 AM      Result Value Range   Glucose-Capillary 108 (*) 70 -  99 mg/dL  GLUCOSE, CAPILLARY     Status: Abnormal   Collection Time    10/20/12 12:12 PM      Result Value Range   Glucose-Capillary 103 (*) 70 - 99 mg/dL  GLUCOSE, CAPILLARY     Status: None   Collection Time    10/20/12  4:51 PM      Result Value Range   Glucose-Capillary 96  70 - 99 mg/dL   Comment 1 Notify RN    GLUCOSE, CAPILLARY     Status: Abnormal   Collection Time    10/20/12  9:05 PM      Result Value Range   Glucose-Capillary 100 (*) 70 - 99 mg/dL  GLUCOSE, CAPILLARY     Status: None   Collection Time    10/21/12  6:15 AM      Result Value Range   Glucose-Capillary 84  70 - 99 mg/dL   Comment 1 Notify RN      Physical Findings: AIMS: Facial and Oral Movements Muscles of Facial Expression: None, normal Lips and Perioral Area: None, normal Jaw: None, normal Tongue: None, normal,Extremity Movements Upper (arms, wrists, hands, fingers): None, normal Lower (legs, knees, ankles, toes): None, normal, Trunk Movements Neck, shoulders, hips: None, normal, Overall Severity Severity of abnormal movements (highest score from questions above): None, normal Incapacitation due to abnormal movements: None, normal Patient's awareness of abnormal movements (rate only patient's report): No Awareness, Dental Status Current problems with teeth and/or dentures?: No Does patient usually wear dentures?: No  CIWA:  CIWA-Ar Total: 4 COWS:  COWS Total Score: 2  Psychiatric Specialty Exam: See Psychiatric Specialty Exam and Suicide Risk Assessment completed by Attending Physician prior to discharge.  Discharge destination:  Home  Is patient on multiple antipsychotic therapies at discharge:  No   Has Patient had three or more failed trials of antipsychotic monotherapy by history:  No  Recommended Plan for Multiple Antipsychotic Therapies:  N/A  Discharge Orders   Future Orders Complete By Expires     Activity as tolerated - No restrictions  As directed     Diet - low sodium heart  healthy  As directed         Medication List    STOP taking these medications       capsaicin 0.025 % cream  Commonly known as:  ZOSTRIX     EPIPEN 2-PAK IJ     HYDROcodone-acetaminophen 5-325 MG per tablet  Commonly known as:  NORCO/VICODIN     omeprazole 20 MG capsule  Commonly known as:  PRILOSEC  Replaced by:  pantoprazole 40 MG tablet     salsalate 750 MG tablet  Commonly known as:  DISALCID     zolpidem 10 MG tablet  Commonly known as:  AMBIEN  TAKE these medications     Indication   ARIPiprazole 5 MG tablet  Commonly known as:  ABILIFY  Take 5 tablets (25 mg total) by mouth daily.   Indication:  Major Depressive Disorder     aspirin EC 81 MG tablet  Take 1 tablet (81 mg total) by mouth daily.   Indication:  Blood Clot     benztropine 2 MG tablet  Commonly known as:  COGENTIN  Take 1 tablet (2 mg total) by mouth 2 (two) times daily.   Indication:  Extrapyramidal Reaction caused by Medications     cetirizine 10 MG tablet  Commonly known as:  ZYRTEC  Take 1 tablet (10 mg total) by mouth daily.   Indication:  Hayfever     citalopram 10 MG tablet  Commonly known as:  CELEXA  Take 3 tablets (30 mg total) by mouth daily.   Indication:  Depression     fish oil-omega-3 fatty acids 1000 MG capsule  Take 2 capsules (2 g total) by mouth daily.   Indication:  Disease of the Heart and Blood Vessels     gabapentin 400 MG capsule  Commonly known as:  NEURONTIN  Take 1 capsule (400 mg total) by mouth 3 (three) times daily.   Indication:  Agitation, Neuropathic Pain, For anxiety     hydrocortisone 2.5 % rectal cream  Commonly known as:  ANUSOL-HC  Place rectally 2 (two) times daily as needed. For hemorrhoids      hydrocortisone 25 MG suppository  Commonly known as:  ANUSOL-HC  Place 1 suppository (25 mg total) rectally 2 (two) times daily as needed for hemorrhoids.   Indication:  Inflamed Hemorrhoids     hydrOXYzine 50 MG tablet  Commonly known as:   ATARAX/VISTARIL  Take 3 tablets (150 mg total) by mouth at bedtime.   Indication:  Anxiety Neurosis, Sedation     indomethacin 50 MG capsule  Commonly known as:  INDOCIN  Take 1 capsule (50 mg total) by mouth 4 (four) times daily.   Indication:  Joint Damage causing Pain and Loss of Function     lisinopril 40 MG tablet  Commonly known as:  PRINIVIL,ZESTRIL  Take 1 tablet (40 mg total) by mouth daily.   Indication:  High Blood Pressure     methocarbamol 750 MG tablet  Commonly known as:  ROBAXIN  Take 1 tablet (750 mg total) by mouth 3 (three) times daily as needed (muscle spasms.).   Indication:  Musculoskeletal Pain     metoprolol succinate 50 MG 24 hr tablet  Commonly known as:  TOPROL-XL  Take 1 tablet (50 mg total) by mouth daily. Take with or immediately following a meal.   Indication:  High Blood Pressure     pantoprazole 40 MG tablet  Commonly known as:  PROTONIX  Take 1 tablet (40 mg total) by mouth 2 (two) times daily.   Indication:  Gastroesophageal Reflux Disease     Vitamin D (Ergocalciferol) 50000 UNITS Caps  Commonly known as:  DRISDOL  Take 1 capsule (50,000 Units total) by mouth every 7 (seven) days.   Indication:  Vitamin D Deficiency        Follow-up recommendations:  Activity:  As tolerated Diet:  Low-sodium heart healthy diet Continue to work the relapse prevention plan Comments:  Patient is followed by the [name] and the social worker is working on getting him appointments in [place] that are closer to him.  Total Discharge Time:  Greater than 30 minutes.  SignedNanine Means, PMH-NP 10/21/2012, 10:50 AM Reymundo Poll. Dub Mikes, M.D.

## 2012-10-21 NOTE — Progress Notes (Signed)
Adult Psychoeducational Group Note  Date:  10/21/2012 Time:  2:06 PM  Group Topic/Focus:  Self Care:   The focus of this group is to help patients understand the importance of self-care in order to improve or restore emotional, physical, spiritual, interpersonal, and financial health.  Participation Level:  Active  Participation Quality:  Appropriate, Attentive and Sharing  Affect:  Appropriate  Cognitive:  Appropriate  Insight: Appropriate and Good  Engagement in Group:  Engaged  Modes of Intervention:  Activity, Discussion, Education and Socialization  Additional Comments:  Adeeb attended group and participated and shared. Patient defined self-care in own term. Patient completed the self care assessment on emotional, physical, spiritual, psychological, relationship care and rated each area. Patient shared the weaknesses and strengthens of each area and was asked to set a goal of the weaknesses of the assessment.    Karleen Hampshire Brittini 10/21/2012, 2:06 PM

## 2012-10-21 NOTE — Tx Team (Signed)
Interdisciplinary Treatment Plan Update   Date Reviewed:  10/21/2012  Time Reviewed:  10:20 AM  Progress in Treatment:   Attending groups: Yes Participating in groups: Yes Taking medication as prescribed: Yes  Tolerating medication: Yes Family/Significant other contact made: No, patient declined collateral contact Patient understands diagnosis: Yes  Discussing patient identified problems/goals with staff: Yes Medical problems stabilized or resolved: Patient being evaluated for medication needs. Denies suicidal/homicidal ideation: No, but contracts for safety Patient has not harmed self or others: Yes  For review of initial/current patient goals, please see plan of care.  Estimated Length of Stay:  Discharge today  Reasons for Continued Hospitalization:   New Problems/Goals identified:    Discharge Plan or Barriers:   Home with outpatient follow up at [facility] and [place]  Additional Comments: Patient reports being safe for discahrge.  Attendees:  Patient:  10/21/2012 10:20 AM   Signature  [name], PA  10/21/2012 10:20 AM  Signature:  Roosevelt Locks, PMH-PA 10/21/2012 10:20 AM  Signature:  Neill Loft, RN 10/21/2012 10:20 AM  Signature:  Elliot Cousin, RN 10/21/2012 10:20 AM  Signature:   Quintella Reichert, RN 10/21/2012 10:20 AM  Signature:  Juline Patch, LCSW 10/21/2012 10:20 AM  Signature:  Reyes Ivan, LCSW 10/21/2012 10:20 AM  Signature:  Maseta Dorley,Care Coordinator 10/21/2012 10:20 AM  Signature: Fransisca Kaufmann, [facility] 10/21/2012 10:20 AM  Signature:    Signature:    Signature:      Scribe for Treatment Team:   Juline Patch,  10/21/2012 10:20 AM

## 2012-10-21 NOTE — Progress Notes (Signed)
Patient ID: [name], male   DOB: [birth year], 52 y.o.   MRN: [id number] Patient was discharged ambulatory to take bus to [facility] shelter in [facility].  He denies SI/HI.  He has a safety plan to call brother or mother or [facility] if suicidal thoughts reoccur. He was given scripts and a 3day supply of meds.  He was given bus passes.  He verbalizes understanding of his discharge meds and followup.  He knows to follow up with medical MD about his BP which has been elevated.

## 2012-10-21 NOTE — Progress Notes (Signed)
[facility] Adult Case Management Discharge Plan :  Will you be returning to the same living situation after discharge: Yes,  Patient will return to prior living arrangement while looking for new housing. At discharge, do you have transportation home?:Yes,  Patient to be provided with bus passes. Do you have the ability to pay for your medications:Yes,  Patient has Medicaid and other insurances.  Release of information consent forms completed and in the chart;  [name] needed at discharge.  Patient to Follow up at: Follow-up Information   Follow up with Dr. Omelia Blackwater Hinda Kehr On 10/25/2012. (You are scheduled with Dr. Omelia Blackwater on Friday, October 25, 2012 at 2:00 PM)    Contact information:   [address] [place], [place]  [address]  [phone]      Follow up with Dr. Casimiro Needle [facility] On 11/19/2012. (You are scheduled with Dr. Casimiro Needle on Tuesday, November 19, 2012 @ 2:30 PM)    Contact information:   [address] [place], [place]  [address]  [phone]      Patient denies SI/HI:   Patient no longer endorsing SI/HI or other thoughts of self harm.     Safety Planning and Suicide Prevention discussed:  .Reviewed with all patients during discharge planning group.   [name] 10/21/2012, 11:09 AM

## 2012-10-23 NOTE — Progress Notes (Signed)
Patient Discharge Instructions:  After Visit Summary (AVS):   Faxed to:  10/23/12 Psychiatric Admission Assessment Note:   Faxed to:  10/23/12 Suicide Risk Assessment - Discharge Assessment:   Faxed to:  10/23/12 Faxed/Sent to the Next Level Care provider:  10/23/12 Faxed to [facility] @ [phone] Faxed to [facility] @ [phone]  Jerelene Redden, 10/23/2012, 1:08 PM

## 2012-11-13 NOTE — Consult Note (Signed)
Agree with assessment and plan Stacy Deshler A. Destenee Guerry, M.D. 

## 2013-03-22 ENCOUNTER — Observation Stay (HOSPITAL_COMMUNITY)
Admission: EM | Admit: 2013-03-22 | Discharge: 2013-03-23 | Discharge status: Against Medical Advice | Payer: PRIVATE HEALTH INSURANCE | Attending: Internal Medicine | Admitting: Internal Medicine

## 2013-03-22 ENCOUNTER — Emergency Department (HOSPITAL_COMMUNITY): Payer: PRIVATE HEALTH INSURANCE

## 2013-03-22 ENCOUNTER — Encounter (HOSPITAL_COMMUNITY): Payer: Self-pay | Admitting: Emergency Medicine

## 2013-03-22 DIAGNOSIS — K219 Gastro-esophageal reflux disease without esophagitis: Secondary | ICD-10-CM | POA: Insufficient documentation

## 2013-03-22 DIAGNOSIS — R519 Headache, unspecified: Secondary | ICD-10-CM

## 2013-03-22 DIAGNOSIS — R209 Unspecified disturbances of skin sensation: Secondary | ICD-10-CM

## 2013-03-22 DIAGNOSIS — B1920 Unspecified viral hepatitis C without hepatic coma: Secondary | ICD-10-CM | POA: Insufficient documentation

## 2013-03-22 DIAGNOSIS — F1010 Alcohol abuse, uncomplicated: Secondary | ICD-10-CM

## 2013-03-22 DIAGNOSIS — F1490 Cocaine use, unspecified, uncomplicated: Secondary | ICD-10-CM

## 2013-03-22 DIAGNOSIS — I252 Old myocardial infarction: Secondary | ICD-10-CM | POA: Insufficient documentation

## 2013-03-22 DIAGNOSIS — R079 Chest pain, unspecified: Principal | ICD-10-CM

## 2013-03-22 DIAGNOSIS — I10 Essential (primary) hypertension: Secondary | ICD-10-CM | POA: Insufficient documentation

## 2013-03-22 DIAGNOSIS — R197 Diarrhea, unspecified: Secondary | ICD-10-CM | POA: Insufficient documentation

## 2013-03-22 DIAGNOSIS — F1910 Other psychoactive substance abuse, uncomplicated: Secondary | ICD-10-CM

## 2013-03-22 DIAGNOSIS — F17200 Nicotine dependence, unspecified, uncomplicated: Secondary | ICD-10-CM | POA: Insufficient documentation

## 2013-03-22 DIAGNOSIS — M549 Dorsalgia, unspecified: Secondary | ICD-10-CM | POA: Insufficient documentation

## 2013-03-22 DIAGNOSIS — M109 Gout, unspecified: Secondary | ICD-10-CM | POA: Insufficient documentation

## 2013-03-22 DIAGNOSIS — R55 Syncope and collapse: Secondary | ICD-10-CM

## 2013-03-22 DIAGNOSIS — R51 Headache: Secondary | ICD-10-CM | POA: Insufficient documentation

## 2013-03-22 DIAGNOSIS — R202 Paresthesia of skin: Secondary | ICD-10-CM | POA: Diagnosis present

## 2013-03-22 DIAGNOSIS — E559 Vitamin D deficiency, unspecified: Secondary | ICD-10-CM | POA: Insufficient documentation

## 2013-03-22 DIAGNOSIS — R300 Dysuria: Secondary | ICD-10-CM | POA: Insufficient documentation

## 2013-03-22 DIAGNOSIS — F1210 Cannabis abuse, uncomplicated: Secondary | ICD-10-CM | POA: Insufficient documentation

## 2013-03-22 DIAGNOSIS — F411 Generalized anxiety disorder: Secondary | ICD-10-CM | POA: Insufficient documentation

## 2013-03-22 DIAGNOSIS — F319 Bipolar disorder, unspecified: Secondary | ICD-10-CM | POA: Insufficient documentation

## 2013-03-22 DIAGNOSIS — R200 Anesthesia of skin: Secondary | ICD-10-CM | POA: Diagnosis present

## 2013-03-22 DIAGNOSIS — F1410 Cocaine abuse, uncomplicated: Secondary | ICD-10-CM | POA: Insufficient documentation

## 2013-03-22 DIAGNOSIS — E119 Type 2 diabetes mellitus without complications: Secondary | ICD-10-CM | POA: Insufficient documentation

## 2013-03-22 LAB — HEMOGLOBIN A1C
Hgb A1c MFr Bld: 6.3 % — ABNORMAL HIGH (ref <5.7)
Mean Plasma Glucose: 134 mg/dL — ABNORMAL HIGH (ref <117)

## 2013-03-22 LAB — HEPATIC FUNCTION PANEL
ALT: 219 U/L — ABNORMAL HIGH (ref 0–53)
AST: 123 U/L — ABNORMAL HIGH (ref 0–37)
Albumin: 4.6 g/dL (ref 3.5–5.2)
Alkaline Phosphatase: 68 U/L (ref 39–117)
Bilirubin, Direct: 0.2 mg/dL (ref 0.0–0.3)
Indirect Bilirubin: 0.5 mg/dL (ref 0.3–0.9)
Total Bilirubin: 0.7 mg/dL (ref 0.3–1.2)
Total Protein: 8.5 g/dL — ABNORMAL HIGH (ref 6.0–8.3)

## 2013-03-22 LAB — RAPID URINE DRUG SCREEN, HOSP PERFORMED
Amphetamines: NOT DETECTED
Barbiturates: NOT DETECTED
Benzodiazepines: NOT DETECTED
Cocaine: POSITIVE — AB
Opiates: POSITIVE — AB
Tetrahydrocannabinol: POSITIVE — AB

## 2013-03-22 LAB — TROPONIN I
Troponin I: <0.3 ng/mL (ref <0.30)
Troponin I: <0.3 ng/mL (ref <0.30)
Troponin I: <0.3 ng/mL (ref <0.30)
Troponin I: <0.3 ng/mL (ref <0.30)

## 2013-03-22 LAB — URINALYSIS, ROUTINE W REFLEX MICROSCOPIC
Bilirubin Urine: NEGATIVE
Glucose, UA: NEGATIVE mg/dL
Hgb urine dipstick: NEGATIVE
Ketones, ur: 40 mg/dL — AB
Leukocytes, UA: NEGATIVE
Nitrite: NEGATIVE
Protein, ur: NEGATIVE mg/dL
Specific Gravity, Urine: 1.037 — ABNORMAL HIGH (ref 1.005–1.030)
Urobilinogen, UA: 0.2 mg/dL (ref 0.0–1.0)
pH: 5.5 (ref 5.0–8.0)

## 2013-03-22 LAB — BASIC METABOLIC PANEL
BUN: 14 mg/dL (ref 6–23)
CO2: 17 mEq/L — ABNORMAL LOW (ref 19–32)
Calcium: 9.8 mg/dL (ref 8.4–10.5)
Chloride: 103 mEq/L (ref 96–112)
Creatinine, Ser: 1.14 mg/dL (ref 0.50–1.35)
GFR calc Af Amer: 84 mL/min — ABNORMAL LOW (ref >90)
GFR calc non Af Amer: 72 mL/min — ABNORMAL LOW (ref >90)
Glucose, Bld: 96 mg/dL (ref 70–99)
Potassium: 3.9 mEq/L (ref 3.5–5.1)
Sodium: 137 mEq/L (ref 135–145)

## 2013-03-22 LAB — POCT I-STAT TROPONIN I: Troponin i, poc: 0.01 ng/mL (ref 0.00–0.08)

## 2013-03-22 LAB — CBC
HCT: 42.2 % (ref 39.0–52.0)
Hemoglobin: 15.1 g/dL (ref 13.0–17.0)
MCH: 29.3 pg (ref 26.0–34.0)
MCHC: 35.8 g/dL (ref 30.0–36.0)
MCV: 81.9 fL (ref 78.0–100.0)
Platelets: 245 10*3/uL (ref 150–400)
RBC: 5.15 MIL/uL (ref 4.22–5.81)
RDW: 14.5 % (ref 11.5–15.5)
WBC: 14.6 10*3/uL — ABNORMAL HIGH (ref 4.0–10.5)

## 2013-03-22 LAB — LIPASE, BLOOD: Lipase: 56 U/L (ref 11–59)

## 2013-03-22 LAB — MRSA PCR SCREENING: MRSA by PCR: POSITIVE — AB

## 2013-03-22 LAB — PRO B NATRIURETIC PEPTIDE: Pro B Natriuretic peptide (BNP): 120.9 pg/mL (ref 0–125)

## 2013-03-22 LAB — ETHANOL: Alcohol, Ethyl (B): <11 mg/dL (ref 0–11)

## 2013-03-22 MED ORDER — PROMETHAZINE HCL 25 MG/ML IJ SOLN
25.0000 mg | Freq: Once | INTRAMUSCULAR | Status: AC
  Administered 2013-03-22: 25 mg via INTRAVENOUS
  Filled 2013-03-22: qty 1

## 2013-03-22 MED ORDER — LORAZEPAM 2 MG/ML IJ SOLN
2.0000 mg | Freq: Once | INTRAMUSCULAR | Status: AC
  Administered 2013-03-22: 2 mg via INTRAVENOUS
  Filled 2013-03-22: qty 1

## 2013-03-22 MED ORDER — PANTOPRAZOLE SODIUM 40 MG IV SOLR
40.0000 mg | Freq: Once | INTRAVENOUS | Status: AC
  Administered 2013-03-22: 40 mg via INTRAVENOUS

## 2013-03-22 MED ORDER — ONDANSETRON HCL 4 MG/2ML IJ SOLN
4.0000 mg | Freq: Once | INTRAMUSCULAR | Status: AC
  Administered 2013-03-22: 4 mg via INTRAVENOUS
  Filled 2013-03-22: qty 2

## 2013-03-22 MED ORDER — M.V.I. ADULT IV INJ
INJECTION | Freq: Once | INTRAVENOUS | Status: DC
  Filled 2013-03-22: qty 1000

## 2013-03-22 MED ORDER — ADULT MULTIVITAMIN W/MINERALS CH
1.0000 | ORAL_TABLET | Freq: Every day | ORAL | Status: DC
  Administered 2013-03-22: 1 via ORAL
  Filled 2013-03-22 (×2): qty 1

## 2013-03-22 MED ORDER — LORAZEPAM 2 MG/ML IJ SOLN
2.0000 mg | INTRAMUSCULAR | Status: DC | PRN
  Administered 2013-03-22: 2 mg via INTRAVENOUS
  Administered 2013-03-22: 3 mg via INTRAVENOUS
  Administered 2013-03-22: 2 mg via INTRAVENOUS
  Filled 2013-03-22 (×3): qty 1
  Filled 2013-03-22: qty 2

## 2013-03-22 MED ORDER — THIAMINE HCL 100 MG/ML IJ SOLN
Freq: Once | INTRAVENOUS | Status: AC
  Administered 2013-03-22: 15:00:00 via INTRAVENOUS
  Filled 2013-03-22: qty 1000

## 2013-03-22 MED ORDER — ASPIRIN 81 MG PO CHEW: 324.0000 mg | CHEWABLE_TABLET | Freq: Once | ORAL | Status: DC

## 2013-03-22 MED ORDER — SODIUM CHLORIDE 0.9 % IJ SOLN
3.0000 mL | Freq: Two times a day (BID) | INTRAMUSCULAR | Status: DC
  Administered 2013-03-22 (×2): 3 mL via INTRAVENOUS

## 2013-03-22 MED ORDER — FOLIC ACID 1 MG PO TABS
1.0000 mg | ORAL_TABLET | Freq: Every day | ORAL | Status: DC
  Administered 2013-03-22: 1 mg via ORAL
  Filled 2013-03-22 (×2): qty 1

## 2013-03-22 MED ORDER — HYDROMORPHONE HCL PF 2 MG/ML IJ SOLN
2.0000 mg | Freq: Once | INTRAMUSCULAR | Status: AC
  Administered 2013-03-22: 2 mg via INTRAVENOUS
  Filled 2013-03-22: qty 1

## 2013-03-22 MED ORDER — LORAZEPAM 1 MG PO TABS
1.0000 mg | ORAL_TABLET | Freq: Four times a day (QID) | ORAL | Status: DC | PRN
  Administered 2013-03-22: 1 mg via ORAL
  Filled 2013-03-22: qty 1

## 2013-03-22 MED ORDER — ACETAMINOPHEN 650 MG RE SUPP: 650.0000 mg | Freq: Four times a day (QID) | RECTAL | Status: DC | PRN

## 2013-03-22 MED ORDER — HYDROMORPHONE HCL PF 1 MG/ML IJ SOLN
1.0000 mg | Freq: Once | INTRAMUSCULAR | Status: AC
  Administered 2013-03-22: 1 mg via INTRAVENOUS
  Filled 2013-03-22 (×2): qty 1

## 2013-03-22 MED ORDER — PROMETHAZINE HCL 25 MG/ML IJ SOLN
25.0000 mg | Freq: Four times a day (QID) | INTRAMUSCULAR | Status: DC | PRN
  Filled 2013-03-22: qty 1

## 2013-03-22 MED ORDER — DEXTROSE-NACL 5-0.9 % IV SOLN
INTRAVENOUS | Status: DC
  Administered 2013-03-22: 50 mL via INTRAVENOUS

## 2013-03-22 MED ORDER — LORAZEPAM 2 MG/ML IJ SOLN: 1.0000 mg | Freq: Four times a day (QID) | INTRAMUSCULAR | Status: DC | PRN

## 2013-03-22 MED ORDER — IOHEXOL 350 MG/ML SOLN: 75.0000 mL | Freq: Once | INTRAVENOUS | Status: DC | PRN

## 2013-03-22 MED ORDER — THIAMINE HCL 100 MG/ML IJ SOLN
100.0000 mg | Freq: Every day | INTRAMUSCULAR | Status: DC
  Filled 2013-03-22: qty 1

## 2013-03-22 MED ORDER — ASPIRIN EC 81 MG PO TBEC
81.0000 mg | DELAYED_RELEASE_TABLET | Freq: Every day | ORAL | Status: DC
  Administered 2013-03-22: 81 mg via ORAL
  Filled 2013-03-22 (×2): qty 1

## 2013-03-22 MED ORDER — ACETAMINOPHEN 325 MG PO TABS: 650.0000 mg | ORAL_TABLET | Freq: Four times a day (QID) | ORAL | Status: DC | PRN

## 2013-03-22 MED ORDER — HEPARIN SODIUM (PORCINE) 5000 UNIT/ML IJ SOLN
5000.0000 [IU] | Freq: Three times a day (TID) | INTRAMUSCULAR | Status: DC
  Administered 2013-03-22 – 2013-03-23 (×3): 5000 [IU] via SUBCUTANEOUS
  Filled 2013-03-22 (×6): qty 1

## 2013-03-22 MED ORDER — PROMETHAZINE HCL 25 MG/ML IJ SOLN: 25.0000 mg | Freq: Four times a day (QID) | INTRAMUSCULAR | Status: DC | PRN

## 2013-03-22 MED ORDER — CHLORHEXIDINE GLUCONATE CLOTH 2 % EX PADS
6.0000 | MEDICATED_PAD | Freq: Every day | CUTANEOUS | Status: DC
  Administered 2013-03-23: 6 via TOPICAL

## 2013-03-22 MED ORDER — THIAMINE HCL 100 MG/ML IJ SOLN: Freq: Once | INTRAVENOUS | Status: DC

## 2013-03-22 MED ORDER — THIAMINE HCL 100 MG/ML IJ SOLN
100.0000 mg | Freq: Every day | INTRAMUSCULAR | Status: DC
  Administered 2013-03-22: 100 mg via INTRAVENOUS
  Filled 2013-03-22: qty 1

## 2013-03-22 MED ORDER — MUPIROCIN 2 % EX OINT
1.0000 "application " | TOPICAL_OINTMENT | Freq: Two times a day (BID) | CUTANEOUS | Status: DC
  Administered 2013-03-22: 1 via NASAL
  Filled 2013-03-22: qty 22

## 2013-03-22 MED ORDER — MORPHINE SULFATE 4 MG/ML IJ SOLN: 4.0000 mg | Freq: Once | INTRAMUSCULAR | Status: DC

## 2013-03-22 MED ORDER — IOHEXOL 350 MG/ML SOLN
75.0000 mL | Freq: Once | INTRAVENOUS | Status: AC | PRN
  Administered 2013-03-22: 75 mL via INTRAVENOUS

## 2013-03-22 MED ORDER — MORPHINE SULFATE 4 MG/ML IJ SOLN
4.0000 mg | Freq: Once | INTRAMUSCULAR | Status: AC
  Administered 2013-03-22: 4 mg via INTRAVENOUS
  Filled 2013-03-22: qty 1

## 2013-03-22 MED ORDER — VITAMIN B-1 100 MG PO TABS
100.0000 mg | ORAL_TABLET | Freq: Every day | ORAL | Status: DC
  Filled 2013-03-22 (×2): qty 1

## 2013-03-22 NOTE — Progress Notes (Signed)
Notified MD about pt still restless.  Pt MRSA positive.  Will finish banana bag fluid that's hanging and continue D5ns.  Will continue to monitor. Sitter at bedside.  [name]

## 2013-03-22 NOTE — ED Notes (Signed)
Patient began having CP approx 1 hour ago. Pain is substernal and non-radiating. However, patient is experiencing numbness and tingling in the right arm. Pain is not reproducible. EMS gave nitro x2 and pain has not changed. Patient is also diaphoretic and experiencing nausea, EMS gave 4mg  of Zofran, and patient had temporary relief, however the nausea has returned. Patient had recent MI approx 8 months ago and states his sx's feel the same. BP 140/88, HR 100 RR 26 and shallow. 100% on 2L. CBG 90.

## 2013-03-22 NOTE — H&P (Signed)
Date: 03/22/2013               Patient Name:  [name] MRN: [id number]  DOB: 1961/03/23 Age / Sex: 52 y.o., male   PCP: Provider Default, MD         Medical Service: Internal Medicine Teaching Service         Attending Physician: Dr. Aletta Edouard, MD    First Contact: Dr. Angelina Sheriff, MD Pager: [phone]  Second Contact: Dr. Leonia Reeves, MD Pager: [phone]       After Hours (After 5p/  First Contact Pager: [phone]  weekends / holidays): Second Contact Pager: [phone]   Chief Complaint: Chest Pain  History of Present Illness: [name] is 52 y.o. man with pmhx of MI 8 months ago ([place]?) who presents to ED with a cc of chest pain. The pain began around 3AM after consuming two alcoholic beverages, marijuana, and $60 dollars worth of cocaine. The patient is intoxicated on interview. The chest pain is substernal and radiates to his right shoulder.  He has associated symptoms of headache and right hand numbness. In the ED, head CT negative, troponin negative. EKG no stemi. ED consulted for admission for chest pain rule out.   On ROS, positve for diarrhea for 2 days. No SOB, no LE swelling. + for dysuria for the last few days.   Meds: Current Facility-Administered Medications  Medication Dose Route Frequency Provider Last Rate Last Dose  . LORazepam (ATIVAN) injection 2 mg  2 mg Intravenous Once Pleas Koch, MD      . promethazine (PHENERGAN) injection 25 mg  25 mg Intravenous Q6H PRN Pleas Koch, MD       No current outpatient prescriptions on file.    Allergies: Allergies as of 03/22/2013  . (No Known Allergies)   Past Medical History  Diagnosis Date  . Arthritis   . Hypertension   . Anxiety   . Bipolar affective disorder   . Depression   . Alcohol abuse   . Cocaine abuse     smokes crack-cocaine  . GERD (gastroesophageal reflux disease)   . Chronic back pain   . Tobacco abuse   . Hepatitis C   . Vitamin D deficiency   . Gout     . Hemorrhoids   . Diabetes mellitus without complication    History reviewed. No pertinent past surgical history. Family History  Problem Relation Age of Onset  . Hypertension Brother   . Diabetes Brother   . Hypertension Mother   . Diabetes Mother   . Arthritis Mother   . Other Sister     bone disease   History   Social History  . Marital Status: Single    Spouse Name: N/A    Number of Children: N/A  . Years of Education: N/A   Occupational History  . Not on file.   Social History Main Topics  . Smoking status: Current Every Day Smoker -- 0.25 packs/day    Types: Cigarettes    Last Attempt to Quit: 11/23/2011  . Smokeless tobacco: Not on file  . Alcohol Use: Yes     Comment: 24 oz--3x's wkly   . Drug Use: Yes    Special: Marijuana, Cocaine  . Sexual Activity: Not Currently   Other Topics Concern  . Not on file   Social History Narrative   Lives alone in [place], [place]    Review of Systems: Pertinent items are noted in HPI.  Physical Exam:  Blood pressure 135/81, pulse 97, resp. rate 20, SpO2 97.00%. Physical Exam  Constitutional: He is oriented to person, place, and time. He appears well-developed and well-nourished. No distress.  HENT:  Head: Normocephalic and atraumatic.  Mouth/Throat: Oropharynx is clear and moist. No oropharyngeal exudate.  Eyes: EOM are normal. Pupils are equal, round, and reactive to light.  Cardiovascular: Regular rhythm, normal heart sounds and intact distal pulses.  Exam reveals no gallop and no friction rub.   No murmur heard. Tachycardic to 105  Pulmonary/Chest: Effort normal and breath sounds normal. No respiratory distress. He has no wheezes. He has no rales. He exhibits no tenderness.  Abdominal: Soft. Bowel sounds are normal. He exhibits no distension. There is no tenderness.  Musculoskeletal: He exhibits no edema and no tenderness.  Neurological: He is alert and oriented to person, place, and time. No cranial nerve  deficit.  Muscles of UE and LE are 5/5. Decreased sensation in right hand.  Skin: He is not diaphoretic.  Psychiatric:  intoxicated     Lab results: Basic Metabolic Panel:  Recent Labs  62/95/28 0531  NA 137  K 3.9  CL 103  CO2 17*  GLUCOSE 96  BUN 14  CREATININE 1.14  CALCIUM 9.8   Liver Function Tests: No results found for this basename: AST, ALT, ALKPHOS, BILITOT, PROT, ALBUMIN,  in the last 72 hours No results found for this basename: LIPASE, AMYLASE,  in the last 72 hours No results found for this basename: AMMONIA,  in the last 72 hours CBC:  Recent Labs  03/22/13 0531  WBC 14.6*  HGB 15.1  HCT 42.2  MCV 81.9  PLT 245   Cardiac Enzymes:  Recent Labs  03/22/13 0548  TROPONINI <0.30   BNP:  Recent Labs  03/22/13 0548  PROBNP 120.9   D-Dimer: No results found for this basename: DDIMER,  in the last 72 hours CBG: No results found for this basename: GLUCAP,  in the last 72 hours Hemoglobin A1C: No results found for this basename: HGBA1C,  in the last 72 hours Fasting Lipid Panel: No results found for this basename: CHOL, HDL, LDLCALC, TRIG, CHOLHDL, LDLDIRECT,  in the last 72 hours Thyroid Function Tests: No results found for this basename: TSH, T4TOTAL, FREET4, T3FREE, THYROIDAB,  in the last 72 hours Anemia Panel: No results found for this basename: VITAMINB12, FOLATE, FERRITIN, TIBC, IRON, RETICCTPCT,  in the last 72 hours Coagulation: No results found for this basename: LABPROT, INR,  in the last 72 hours Urine Drug Screen: Drugs of Abuse     Component Value Date/Time   LABOPIA NONE DETECTED 10/17/2012 1711   COCAINSCRNUR POSITIVE* 10/17/2012 1711   LABBENZ NONE DETECTED 10/17/2012 1711   AMPHETMU NONE DETECTED 10/17/2012 1711   THCU NONE DETECTED 10/17/2012 1711   LABBARB NONE DETECTED 10/17/2012 1711    Alcohol Level: No results found for this basename: ETH,  in the last 72 hours Urinalysis: No results found for this basename:  COLORURINE, APPERANCEUR, LABSPEC, PHURINE, GLUCOSEU, HGBUR, BILIRUBINUR, KETONESUR, PROTEINUR, UROBILINOGEN, NITRITE, LEUKOCYTESUR,  in the last 72 hours   Imaging results:  Dg Chest 2 View  03/22/2013   CLINICAL DATA:  Chest pain  EXAM: CHEST  2 VIEW  COMPARISON:  10/17/2012  FINDINGS: The heart size and mediastinal contours are within normal limits. Both lungs are clear. The visualized skeletal structures are unremarkable.  IMPRESSION: No active cardiopulmonary disease.   Electronically Signed   By: Ruel Favors M.D.   On: 03/22/2013  07:40   Ct Head Wo Contrast  03/22/2013   CLINICAL DATA:  Chest pain and headaches  EXAM: CT HEAD WITHOUT CONTRAST  TECHNIQUE: Contiguous axial images were obtained from the base of the skull through the vertex without intravenous contrast.  COMPARISON:  07/13/2012  FINDINGS: The bony calvarium is intact. No gross soft tissue abnormality is identified. Ventricles are mildly prominent but stable in appearance from the prior exam. No findings to suggest acute hemorrhage, acute infarction or space-occupying mass lesion are identified.  IMPRESSION: No acute abnormality noted.   Electronically Signed   By: Alcide Clever M.D.   On: 03/22/2013 07:35   Ct Angio Chest W/cm &/or Wo Cm  03/22/2013   CLINICAL DATA:  Acute onset of substernal chest pain and diaphoresis.  EXAM: CT ANGIOGRAPHY CHEST WITH CONTRAST  TECHNIQUE: Multidetector CT imaging of the chest was performed using the standard protocol during bolus administration of intravenous contrast. Multiplanar CT image reconstructions including MIPs were obtained to evaluate the vascular anatomy.  CONTRAST:  75mL OMNIPAQUE IOHEXOL 350 MG/ML SOLN  COMPARISON:  Chest radiograph, 10/17/2012  FINDINGS: No evidence of a pulmonary embolus.  The heart is normal in size and configuration. There are mild coronary artery calcifications. The great vessels are normal in caliber. No mediastinal or hilar masses or pathologically enlarged  lymph nodes.  Lungs show dependent subsegmental atelectasis. No focal consolidation. No pulmonary edema. No pleural effusion or pneumothorax is seen  Limited evaluation of the upper abdomen is unremarkable.  No significant bony abnormality.  Review of the MIP images confirms the above findings.  IMPRESSION: 1. No evidence of a pulmonary embolus. 2. No acute findings. 3. Dependent bilateral lung subsegmental atelectasis. No edema or convincing infiltrate.   Electronically Signed   By: Amie Portland M.D.   On: 03/22/2013 07:39    Other results: EKG: Sinus rhythm RSR' in V1 or V2, right VCD or RVH ST elevation, consider inferior injury   Assessment & Plan by Problem: Principal Problem:   Chest pain Active Problems:   Alcohol abuse   Polysubstance abuse   GERD (gastroesophageal reflux disease)   Hepatitis C   Cocaine abuse   Dysuria   Headache   Chest Pain The patient likely has cocaine induced chest pain. Initial troponin is negative. EKG with possible inferior injury. CTA negative for aortic dissection or PE. No evidence of PNA. Patient given ASA 325 mg in ED. Nitro x 2 by EMS with improvement of pain. -Trend troponins x 3 - BZD morphine and phenergan prn - Consult cardiology if positive trop + or new findings.   - Oxygen - Tele - Repeat EKG tomorrow am. - No BB given cocaine use - NPO - Lipase Normal - LFTs mild elevations  R. Hand numbness in setting of headache Patient has 5/5 strength in right hand and arm. Mild decrease in sensation in right hand. May be nerve palsy palsy versus CVA. Exam was made difficult due to patients intoxication.  The headache may also be due to NTG given by EMS.  In terms of CVA, CT negative in ED and patient is outside of the treatment window for TPA. May consult neurology if persistent numbness once patients intoxication resolves.  - Neuro exams q 2 - Consulted neurology  Dysuria Patient may have UTI. -Checking UA and Urine CX  Polysubstance Drug  Abuse - Banana Bag - CIWA Protocol - Will consult SW for counseling eval -  UDS (cocaine, THC, opiates) - Ethanol <11  Hepatitis  C - Checking LFTs and Lipase  DM Not on home meds. Will check A1c.  GERD IV PPI  Dispo: Disposition is deferred at this time, awaiting improvement of current medical problems. Anticipated discharge in approximately 1-2 day(s).   The patient does not have a current PCP (Provider Default, MD) and does need an [facility] hospital follow-up appointment after discharge.  The patient does not have transportation limitations that hinder transportation to clinic appointments.  Signed: Pleas Koch, MD 03/22/2013, 10:09 AM

## 2013-03-22 NOTE — ED Provider Notes (Signed)
Medical screening examination/treatment/procedure(s) were conducted as a shared visit with non-physician practitioner(s) and myself.  I personally evaluated the patient during the encounter.  EKG Interpretation    Date/Time:  Saturday March 22 2013 05:30:04 EST Ventricular Rate:  87 PR Interval:  191 QRS Duration: 91 QT Interval:  386 QTC Calculation: 464 R Axis:   25 Text Interpretation:  Age not entered, assumed to be  52 years old for purpose of ECG interpretation Sinus rhythm RSR' in V1 or V2, right VCD or RVH ST elevation, consider inferior injury Confirmed by [facility]  MD, Rett Stehlik ([id number]) on 03/22/2013 5:50:49 AM            Plan for admit to medicine for further evaluation.   Darlys Gales, MD 03/22/13 [phone]

## 2013-03-22 NOTE — ED Provider Notes (Signed)
MSE was initiated and I personally evaluated the patient and placed orders (if any) at  5:51 AM on March 22, 2013.  Pt reports feeling terrible.  He c/o CP and he reports doing drugs and drinking last night.    The patient appears stable so that the remainder of the MSE may be completed by another provider.  Darlys Gales, MD 03/22/13 [phone]

## 2013-03-22 NOTE — ED Notes (Signed)
Pt in radiology 

## 2013-03-22 NOTE — ED Provider Notes (Signed)
CSN: [id number]     Arrival date & time 03/22/13  0509 History   First MD Initiated Contact with Patient 03/22/13 0600     Chief Complaint  Patient presents with  . Chest Pain   (Consider location/radiation/quality/duration/timing/severity/associated sxs/prior Treatment) HPI Comments: Patient is a 52 yo M PMHx significant MI eight months ago, HTN, Anxiety, Depression, ETOH abuse, Cocaine abuse, GERD, Hepatitis C, DM, Chronic back pain presenting to the ED for two complaints. Patient's first complaint is substernal chest pressure without radiation that began around 3AM after two alcoholic beverages, marijuana, and $60 dollars worth of cocaine last evening. Patient endorses that his pain is 6/10 and is improved after 325mg  ASA and 2 SL nitroglycerin tablets. Patient endorses associated diaphoresis and nausea w/ one episode of emesis. The patient states he was treated in [place] for his MI eight months ago. The patient's second complaint is a 9/10 generalized headache w/ associated photophobia. Patient is endorsing associated right sided hand numbness. He denies any alleviating factors.   Patient is a 52 y.o. male presenting with chest pain.  Chest Pain Associated symptoms: diaphoresis, headache and nausea   Associated symptoms: no cough, no fever and no shortness of breath     Past Medical History  Diagnosis Date  . Arthritis   . Hypertension   . Anxiety   . Bipolar affective disorder   . Depression   . Alcohol abuse   . Cocaine abuse     smokes crack-cocaine  . GERD (gastroesophageal reflux disease)   . Chronic back pain   . Tobacco abuse   . Hepatitis C   . Vitamin D deficiency   . Gout   . Hemorrhoids   . Diabetes mellitus without complication    History reviewed. No pertinent past surgical history. Family History  Problem Relation Age of Onset  . Hypertension Brother   . Diabetes Brother   . Hypertension Mother   . Diabetes Mother   . Arthritis Mother   . Other Sister      bone disease   History  Substance Use Topics  . Smoking status: Current Every Day Smoker -- 0.25 packs/day    Types: Cigarettes    Last Attempt to Quit: 11/23/2011  . Smokeless tobacco: Not on file  . Alcohol Use: Yes     Comment: 24 oz--3x's wkly     Review of Systems  Constitutional: Positive for diaphoresis. Negative for fever.  Eyes: Positive for photophobia.  Respiratory: Negative for cough and shortness of breath.   Cardiovascular: Positive for chest pain.  Gastrointestinal: Positive for nausea.  Neurological: Positive for headaches.  All other systems reviewed and are negative.    Allergies  Review of patient's allergies indicates no known allergies.  Home Medications   No current outpatient prescriptions on file. BP 166/90  Pulse 77  Temp(Src) 97.6 F (36.4 C) (Oral)  Resp 16  SpO2 93% Physical Exam  Constitutional: He is oriented to person, place, and time. He appears well-developed and well-nourished. No distress.  HENT:  Head: Normocephalic and atraumatic.  Right Ear: External ear normal.  Left Ear: External ear normal.  Nose: Nose normal.  Mouth/Throat: Oropharynx is clear and moist. No oropharyngeal exudate.  Eyes: Conjunctivae and EOM are normal. Pupils are equal, round, and reactive to light.  Neck: Normal range of motion. Neck supple.  Cardiovascular: Normal rate, regular rhythm, normal heart sounds and intact distal pulses.   Pulmonary/Chest: Effort normal and breath sounds normal. No respiratory distress. He has  no wheezes.  Abdominal: Soft. There is no tenderness.  Musculoskeletal: Normal range of motion.  Neurological: He is alert and oriented to person, place, and time. He has normal strength. No cranial nerve deficit or sensory deficit. Gait normal. GCS eye subscore is 4. GCS verbal subscore is 5. GCS motor subscore is 6.  No pronator drift. Bilateral heel-knee-shin intact.  Skin: Skin is warm and dry. He is not diaphoretic.    ED Course   Procedures (including critical care time) Medications  heparin injection 5,000 Units (not administered)  sodium chloride 0.9 % injection 3 mL (not administered)  sodium chloride 0.9 % 1,000 mL with thiamine 100 mg, folic acid 1 mg, multivitamins adult 10 mL infusion (not administered)  acetaminophen (TYLENOL) tablet 650 mg (not administered)    Or  acetaminophen (TYLENOL) suppository 650 mg (not administered)  aspirin EC tablet 81 mg (not administered)  promethazine (PHENERGAN) injection 25 mg (not administered)  LORazepam (ATIVAN) tablet 1 mg (not administered)    Or  LORazepam (ATIVAN) injection 1 mg (not administered)  thiamine (VITAMIN B-1) tablet 100 mg (not administered)    Or  thiamine (B-1) injection 100 mg (not administered)  folic acid (FOLVITE) tablet 1 mg (not administered)  multivitamin with minerals tablet 1 tablet (not administered)  pantoprazole (PROTONIX) injection 40 mg (not administered)  ondansetron (ZOFRAN) injection 4 mg (not administered)  morphine 4 MG/ML injection 4 mg (4 mg Intravenous Given 03/22/13 0559)  ondansetron (ZOFRAN) injection 4 mg (4 mg Intravenous Given 03/22/13 0558)  HYDROmorphone (DILAUDID) injection 1 mg (1 mg Intravenous Given 03/22/13 0657)  LORazepam (ATIVAN) injection 2 mg (2 mg Intravenous Given 03/22/13 0652)  iohexol (OMNIPAQUE) 350 MG/ML injection 75 mL (75 mLs Intravenous Contrast Given 03/22/13 0718)  HYDROmorphone (DILAUDID) injection 2 mg (2 mg Intravenous Given 03/22/13 0835)  LORazepam (ATIVAN) injection 2 mg (2 mg Intravenous Given 03/22/13 1055)  promethazine (PHENERGAN) injection 25 mg (25 mg Intravenous Given 03/22/13 1058)    Labs Review Labs Reviewed  CBC - Abnormal; Notable for the following:    WBC 14.6 (*)    All other components within normal limits  BASIC METABOLIC PANEL - Abnormal; Notable for the following:    CO2 17 (*)    GFR calc non Af Amer 72 (*)    GFR calc Af Amer 84 (*)    All other components  within normal limits  URINE CULTURE  CLOSTRIDIUM DIFFICILE BY PCR  TROPONIN I  PRO B NATRIURETIC PEPTIDE  TROPONIN I  URINE RAPID DRUG SCREEN (HOSP PERFORMED)  URINALYSIS, ROUTINE W REFLEX MICROSCOPIC  TROPONIN I  TROPONIN I  TROPONIN I  HEPATIC FUNCTION PANEL  LIPASE, BLOOD  ETHANOL  HEMOGLOBIN A1C  POCT I-STAT TROPONIN I   Imaging Review Dg Chest 2 View  03/22/2013   CLINICAL DATA:  Chest pain  EXAM: CHEST  2 VIEW  COMPARISON:  10/17/2012  FINDINGS: The heart size and mediastinal contours are within normal limits. Both lungs are clear. The visualized skeletal structures are unremarkable.  IMPRESSION: No active cardiopulmonary disease.   Electronically Signed   By: Ruel Favors M.D.   On: 03/22/2013 07:40   Ct Head Wo Contrast  03/22/2013   CLINICAL DATA:  Chest pain and headaches  EXAM: CT HEAD WITHOUT CONTRAST  TECHNIQUE: Contiguous axial images were obtained from the base of the skull through the vertex without intravenous contrast.  COMPARISON:  07/13/2012  FINDINGS: The bony calvarium is intact. No gross soft tissue abnormality is identified.  Ventricles are mildly prominent but stable in appearance from the prior exam. No findings to suggest acute hemorrhage, acute infarction or space-occupying mass lesion are identified.  IMPRESSION: No acute abnormality noted.   Electronically Signed   By: Alcide Clever M.D.   On: 03/22/2013 07:35   Ct Angio Chest W/cm &/or Wo Cm  03/22/2013   CLINICAL DATA:  Acute onset of substernal chest pain and diaphoresis.  EXAM: CT ANGIOGRAPHY CHEST WITH CONTRAST  TECHNIQUE: Multidetector CT imaging of the chest was performed using the standard protocol during bolus administration of intravenous contrast. Multiplanar CT image reconstructions including MIPs were obtained to evaluate the vascular anatomy.  CONTRAST:  75mL OMNIPAQUE IOHEXOL 350 MG/ML SOLN  COMPARISON:  Chest radiograph, 10/17/2012  FINDINGS: No evidence of a pulmonary embolus.  The heart is  normal in size and configuration. There are mild coronary artery calcifications. The great vessels are normal in caliber. No mediastinal or hilar masses or pathologically enlarged lymph nodes.  Lungs show dependent subsegmental atelectasis. No focal consolidation. No pulmonary edema. No pleural effusion or pneumothorax is seen  Limited evaluation of the upper abdomen is unremarkable.  No significant bony abnormality.  Review of the MIP images confirms the above findings.  IMPRESSION: 1. No evidence of a pulmonary embolus. 2. No acute findings. 3. Dependent bilateral lung subsegmental atelectasis. No edema or convincing infiltrate.   Electronically Signed   By: Amie Portland M.D.   On: 03/22/2013 07:39    EKG Interpretation    Date/Time:  Saturday March 22 2013 05:30:04 EST Ventricular Rate:  87 PR Interval:  191 QRS Duration: 91 QT Interval:  386 QTC Calculation: 464 R Axis:   25 Text Interpretation:  Age not entered, assumed to be  52 years old for purpose of ECG interpretation Sinus rhythm RSR' in V1 or V2, right VCD or RVH ST elevation, consider inferior injury Confirmed by [facility]  MD, [name] ([id number]) on 03/22/2013 5:50:49 AM            MDM   1. Chest pain   2. Cocaine use     Afebrile, NAD, non-toxic appearing, AAOx4.   Concern for cardiac etiology of Chest Pain. Hospitalist has been consulted and will see patient in the ED for likely admit for likely cocaine induced CP. Pt does not meet criteria for CP protocol and a further evaluation is recommended. Pt has been re-evaluated prior to consult and VSS, NAD, heart RRR, pain has improved, but not completely gone, lungs CTAB. No acute abnormalities found on EKG and first round of cardiac enzymes negative. This case was discussed with Dr. Redgie Grayer who has seen the patient and agrees with plan to admit.      Jeannetta Ellis, PA-C 03/22/13 1249

## 2013-03-22 NOTE — Consult Note (Signed)
Reason for Consult: Right upper extremity numbness.  HPI:                                                                                                                                          [name] is an 52 y.o. male with a history of hypertension, bipolar affective disorder, polysubstance abuse, including alcohol and cocaine, as well as diabetes mellitus who was admitted for evaluation of chest pain. Urine was positive for opiates, cocaine and THC. Patient indicates he woke up earlier today with numbness involving his right forearm and hand. He is complaining of reduced use of his hand but no clear weakness. Patient is right-handed. He has not had an injury to his right upper extremity nor his neck. A CT scan of his head was obtained which was unremarkable. He's had no symptoms involving right side of his face nor right lower extremity. Alcohol level was undetectable.  Past Medical History  Diagnosis Date  . Arthritis   . Hypertension   . Anxiety   . Bipolar affective disorder   . Depression   . Alcohol abuse   . Cocaine abuse     smokes crack-cocaine  . GERD (gastroesophageal reflux disease)   . Chronic back pain   . Tobacco abuse   . Hepatitis C   . Vitamin D deficiency   . Gout   . Hemorrhoids   . Diabetes mellitus without complication     History reviewed. No pertinent past surgical history.  Family History  Problem Relation Age of Onset  . Hypertension Brother   . Diabetes Brother   . Hypertension Mother   . Diabetes Mother   . Arthritis Mother   . Other Sister     bone disease    Social History:  reports that he has been smoking Cigarettes.  He has been smoking about 0.25 packs per day. He does not have any smokeless tobacco history on file. He reports that he drinks alcohol. He reports that he uses illicit drugs (Marijuana and Cocaine).  No Known Allergies  MEDICATIONS:                                                                                                                      I have reviewed the patient's current medications.   ROS:  History obtained from the patient  General ROS: negative for - chills, fatigue, fever, night sweats, weight gain or weight loss Psychological ROS: negative for - behavioral disorder, hallucinations, memory difficulties, mood swings or suicidal ideation Ophthalmic ROS: negative for - blurry vision, double vision, eye pain or loss of vision ENT ROS: negative for - epistaxis, nasal discharge, oral lesions, sore throat, tinnitus or vertigo Allergy and Immunology ROS: negative for - hives or itchy/watery eyes Hematological and Lymphatic ROS: negative for - bleeding problems, bruising or swollen lymph nodes Endocrine ROS: negative for - galactorrhea, hair pattern changes, polydipsia/polyuria or temperature intolerance Respiratory ROS: negative for - cough, hemoptysis, shortness of breath or wheezing Cardiovascular ROS: negative for - chest pain, dyspnea on exertion, edema or irregular heartbeat Gastrointestinal ROS: negative for - abdominal pain, diarrhea, hematemesis, nausea/vomiting or stool incontinence Genito-Urinary ROS: negative for - dysuria, hematuria, incontinence or urinary frequency/urgency Musculoskeletal ROS: negative for - joint swelling or muscular weakness Neurological ROS: as noted in HPI Dermatological ROS: negative for rash and skin lesion changes   Blood pressure 166/90, pulse 77, temperature 97.6 F (36.4 C), temperature source Oral, resp. rate 16, SpO2 93.00%.   Neurologic Examination:                                                                                                      Mental Status: Somewhat lethargic, oriented, moderately agitated.  Speech fluent without evidence of aphasia. Able to follow commands without difficulty. Cranial  Nerves: II-Visual fields were normal. III/IV/VI-Pupils were equal and reacted. Extraocular movements were full and conjugate.    V/VII-no facial numbness and no facial weakness. VIII-normal. X-normal speech and symmetrical palatal movement. Motor: 5/5 bilaterally with normal tone and bulk, including right upper extremity proximally and distally. There was no intrinsic hand muscle weakness. Sensory: Inconsistent responses to sensory testing with touch stimulation as well as pinprick. There was no consistent pattern of numbness demonstrated, including no dermatomal pattern nor peripheral nerve distribution. Deep Tendon Reflexes: 2+ and symmetric. Plantars: Flexor bilaterally Cerebellar: Normal finger-to-nose testing bilaterally. Carotid auscultation: Normal  Lab Results  Component Value Date/Time   CHOL 171 02/23/2012 12:45 PM    Results for orders placed during the hospital encounter of 03/22/13 (from the past 48 hour(s))  CBC     Status: Abnormal   Collection Time    03/22/13  5:31 AM      Result Value Range   WBC 14.6 (*) 4.0 - 10.5 K/uL   RBC 5.15  4.22 - 5.81 MIL/uL   Hemoglobin 15.1  13.0 - 17.0 g/dL   HCT 16.1  09.6 - 04.5 %   MCV 81.9  78.0 - 100.0 fL   MCH 29.3  26.0 - 34.0 pg   MCHC 35.8  30.0 - 36.0 g/dL   RDW 40.9  81.1 - 91.4 %   Platelets 245  150 - 400 K/uL  BASIC METABOLIC PANEL     Status: Abnormal   Collection Time    03/22/13  5:31 AM      Result Value Range   Sodium 137  135 - 145 mEq/L   Potassium 3.9  3.5 - 5.1 mEq/L   Chloride 103  96 - 112 mEq/L   CO2 17 (*) 19 - 32 mEq/L   Glucose, Bld 96  70 - 99 mg/dL   BUN 14  6 - 23 mg/dL   Creatinine, Ser 1.61  0.50 - 1.35 mg/dL   Calcium 9.8  8.4 - 09.6 mg/dL   GFR calc non Af Amer 72 (*) >90 mL/min   GFR calc Af Amer 84 (*) >90 mL/min   Comment: (NOTE)     The eGFR has been calculated using the CKD EPI equation.     This calculation has not been validated in all clinical situations.     eGFR's  persistently <90 mL/min signify possible Chronic Kidney     Disease.  POCT I-STAT TROPONIN I     Status: None   Collection Time    03/22/13  5:46 AM      Result Value Range   Troponin i, poc 0.01  0.00 - 0.08 ng/mL   Comment 3            Comment: Due to the release kinetics of cTnI,     a negative result within the first hours     of the onset of symptoms does not rule out     myocardial infarction with certainty.     If myocardial infarction is still suspected,     repeat the test at appropriate intervals.  TROPONIN I     Status: None   Collection Time    03/22/13  5:48 AM      Result Value Range   Troponin I <0.30  <0.30 ng/mL   Comment:            Due to the release kinetics of cTnI,     a negative result within the first hours     of the onset of symptoms does not rule out     myocardial infarction with certainty.     If myocardial infarction is still suspected,     repeat the test at appropriate intervals.  PRO B NATRIURETIC PEPTIDE     Status: None   Collection Time    03/22/13  5:48 AM      Result Value Range   Pro B Natriuretic peptide (BNP) 120.9  0 - 125 pg/mL  TROPONIN I     Status: None   Collection Time    03/22/13  9:46 AM      Result Value Range   Troponin I <0.30  <0.30 ng/mL   Comment:            Due to the release kinetics of cTnI,     a negative result within the first hours     of the onset of symptoms does not rule out     myocardial infarction with certainty.     If myocardial infarction is still suspected,     repeat the test at appropriate intervals.  TROPONIN I     Status: None   Collection Time    03/22/13 12:45 PM      Result Value Range   Troponin I <0.30  <0.30 ng/mL   Comment:            Due to the release kinetics of cTnI,     a negative result within the first hours     of the onset of symptoms does not rule out  myocardial infarction with certainty.     If myocardial infarction is still suspected,     repeat the test at  appropriate intervals.  HEPATIC FUNCTION PANEL     Status: Abnormal   Collection Time    03/22/13 12:45 PM      Result Value Range   Total Protein 8.5 (*) 6.0 - 8.3 g/dL   Albumin 4.6  3.5 - 5.2 g/dL   AST 161 (*) 0 - 37 U/L   Comment: HEMOLYSIS AT THIS LEVEL MAY AFFECT RESULT   ALT 219 (*) 0 - 53 U/L   Alkaline Phosphatase 68  39 - 117 U/L   Total Bilirubin 0.7  0.3 - 1.2 mg/dL   Bilirubin, Direct 0.2  0.0 - 0.3 mg/dL   Indirect Bilirubin 0.5  0.3 - 0.9 mg/dL  LIPASE, BLOOD     Status: None   Collection Time    03/22/13 12:45 PM      Result Value Range   Lipase 56  11 - 59 U/L  ETHANOL     Status: None   Collection Time    03/22/13 12:45 PM      Result Value Range   Alcohol, Ethyl (B) <11  0 - 11 mg/dL   Comment:            LOWEST DETECTABLE LIMIT FOR     SERUM ALCOHOL IS 11 mg/dL     FOR MEDICAL PURPOSES ONLY  URINE RAPID DRUG SCREEN (HOSP PERFORMED)     Status: Abnormal   Collection Time    03/22/13  3:14 PM      Result Value Range   Opiates POSITIVE (*) NONE DETECTED   Cocaine POSITIVE (*) NONE DETECTED   Benzodiazepines NONE DETECTED  NONE DETECTED   Amphetamines NONE DETECTED  NONE DETECTED   Tetrahydrocannabinol POSITIVE (*) NONE DETECTED   Barbiturates NONE DETECTED  NONE DETECTED   Comment:            DRUG SCREEN FOR MEDICAL PURPOSES     ONLY.  IF CONFIRMATION IS NEEDED     FOR ANY PURPOSE, NOTIFY LAB     WITHIN 5 DAYS.                LOWEST DETECTABLE LIMITS     FOR URINE DRUG SCREEN     Drug Class       Cutoff (ng/mL)     Amphetamine      1000     Barbiturate      200     Benzodiazepine   200     Tricyclics       300     Opiates          300     Cocaine          300     THC              50  URINALYSIS, ROUTINE W REFLEX MICROSCOPIC     Status: Abnormal   Collection Time    03/22/13  3:14 PM      Result Value Range   Color, Urine YELLOW  YELLOW   APPearance CLEAR  CLEAR   Specific Gravity, Urine 1.037 (*) 1.005 - 1.030   pH 5.5  5.0 - 8.0    Glucose, UA NEGATIVE  NEGATIVE mg/dL   Hgb urine dipstick NEGATIVE  NEGATIVE   Bilirubin Urine NEGATIVE  NEGATIVE   Ketones, ur 40 (*) NEGATIVE mg/dL   Protein, ur NEGATIVE  NEGATIVE mg/dL   Urobilinogen, UA 0.2  0.0 - 1.0 mg/dL   Nitrite NEGATIVE  NEGATIVE   Leukocytes, UA NEGATIVE  NEGATIVE   Comment: MICROSCOPIC NOT DONE ON URINES WITH NEGATIVE PROTEIN, BLOOD, LEUKOCYTES, NITRITE, OR GLUCOSE <1000 mg/dL.    Dg Chest 2 View  03/22/2013   CLINICAL DATA:  Chest pain  EXAM: CHEST  2 VIEW  COMPARISON:  10/17/2012  FINDINGS: The heart size and mediastinal contours are within normal limits. Both lungs are clear. The visualized skeletal structures are unremarkable.  IMPRESSION: No active cardiopulmonary disease.   Electronically Signed   By: Ruel Favors M.D.   On: 03/22/2013 07:40   Ct Head Wo Contrast  03/22/2013   CLINICAL DATA:  Chest pain and headaches  EXAM: CT HEAD WITHOUT CONTRAST  TECHNIQUE: Contiguous axial images were obtained from the base of the skull through the vertex without intravenous contrast.  COMPARISON:  07/13/2012  FINDINGS: The bony calvarium is intact. No gross soft tissue abnormality is identified. Ventricles are mildly prominent but stable in appearance from the prior exam. No findings to suggest acute hemorrhage, acute infarction or space-occupying mass lesion are identified.  IMPRESSION: No acute abnormality noted.   Electronically Signed   By: Alcide Clever M.D.   On: 03/22/2013 07:35   Ct Angio Chest W/cm &/or Wo Cm  03/22/2013   CLINICAL DATA:  Acute onset of substernal chest pain and diaphoresis.  EXAM: CT ANGIOGRAPHY CHEST WITH CONTRAST  TECHNIQUE: Multidetector CT imaging of the chest was performed using the standard protocol during bolus administration of intravenous contrast. Multiplanar CT image reconstructions including MIPs were obtained to evaluate the vascular anatomy.  CONTRAST:  75mL OMNIPAQUE IOHEXOL 350 MG/ML SOLN  COMPARISON:  Chest radiograph,  10/17/2012  FINDINGS: No evidence of a pulmonary embolus.  The heart is normal in size and configuration. There are mild coronary artery calcifications. The great vessels are normal in caliber. No mediastinal or hilar masses or pathologically enlarged lymph nodes.  Lungs show dependent subsegmental atelectasis. No focal consolidation. No pulmonary edema. No pleural effusion or pneumothorax is seen  Limited evaluation of the upper abdomen is unremarkable.  No significant bony abnormality.  Review of the MIP images confirms the above findings.  IMPRESSION: 1. No evidence of a pulmonary embolus. 2. No acute findings. 3. Dependent bilateral lung subsegmental atelectasis. No edema or convincing infiltrate.   Electronically Signed   By: Amie Portland M.D.   On: 03/22/2013 07:39   Assessment/Plan: No clinical signs of an acute focal neurological deficit, including no clinical signs of acute stroke nor radiculopathy or peripheral neuropathy. Patient has no objective deficit nor does he have consistent pattern of sensory loss involving his right upper extremity.  Recommend obtaining an MRI of his brain without contrast to rule out remote likelihood of small subcortical left CVA, given his risk factors for stroke, including diabetes mellitus and hypertension. Also recommend aspirin 81 mg per day.  C.R. Roseanne Reno, MD [facility] [phone]  03/22/2013, 5:25 PM

## 2013-03-22 NOTE — ED Provider Notes (Signed)
Medical screening examination/treatment/procedure(s) were conducted as a shared visit with non-physician practitioner(s) and myself.  I personally evaluated the patient during the encounter.  EKG Interpretation    Date/Time:  Saturday March 22 2013 05:30:04 EST Ventricular Rate:  87 PR Interval:  191 QRS Duration: 91 QT Interval:  386 QTC Calculation: 464 R Axis:   25 Text Interpretation:  Age not entered, assumed to be  52 years old for purpose of ECG interpretation Sinus rhythm RSR' in V1 or V2, right VCD or RVH ST elevation, consider inferior injury Confirmed by [facility]  MD, [name] ([id number]) on 03/22/2013 5:50:49 AM            52 yo male presents with CP after drug and EtOH abuse last PM.  VSS.  ER w/u neg, but EKG with inferior changes.  No clear STEMI.  Istat trop negative.  BNP neg.  CXR, CT head, CTA chest neg.    Plan to admit for ACS r/o and further studies.   9:02 AM Pt supervision t/o to Dr. Oletta Lamas atttending pending admission.     Darlys Gales, MD 03/22/13 [phone]

## 2013-03-22 NOTE — ED Notes (Signed)
Complaining of numbness and tingling in right arm. CNS intact.

## 2013-03-22 NOTE — ED Notes (Signed)
Pt given urinal.  Pt advised that urine specimen was needed.  Advised pt that if he is unable to provide urine a cath would be done to collect.

## 2013-03-23 ENCOUNTER — Emergency Department (HOSPITAL_COMMUNITY)
Admission: EM | Admit: 2013-03-23 | Discharge: 2013-03-23 | Disposition: A | Discharge status: Home / Self Care | Payer: PRIVATE HEALTH INSURANCE | Attending: Emergency Medicine | Admitting: Emergency Medicine

## 2013-03-23 ENCOUNTER — Observation Stay (HOSPITAL_COMMUNITY): Payer: PRIVATE HEALTH INSURANCE

## 2013-03-23 ENCOUNTER — Encounter (HOSPITAL_COMMUNITY): Payer: Self-pay | Admitting: Emergency Medicine

## 2013-03-23 DIAGNOSIS — R079 Chest pain, unspecified: Secondary | ICD-10-CM | POA: Diagnosis present

## 2013-03-23 DIAGNOSIS — R0789 Other chest pain: Secondary | ICD-10-CM | POA: Insufficient documentation

## 2013-03-23 DIAGNOSIS — F1920 Other psychoactive substance dependence, uncomplicated: Secondary | ICD-10-CM | POA: Diagnosis present

## 2013-03-23 DIAGNOSIS — I10 Essential (primary) hypertension: Secondary | ICD-10-CM | POA: Diagnosis not present

## 2013-03-23 DIAGNOSIS — F1021 Alcohol dependence, in remission: Secondary | ICD-10-CM | POA: Diagnosis not present

## 2013-03-23 DIAGNOSIS — B1920 Unspecified viral hepatitis C without hepatic coma: Secondary | ICD-10-CM | POA: Diagnosis present

## 2013-03-23 DIAGNOSIS — Z8739 Personal history of other diseases of the musculoskeletal system and connective tissue: Secondary | ICD-10-CM | POA: Diagnosis not present

## 2013-03-23 DIAGNOSIS — R5381 Other malaise: Secondary | ICD-10-CM | POA: Diagnosis not present

## 2013-03-23 DIAGNOSIS — E119 Type 2 diabetes mellitus without complications: Secondary | ICD-10-CM | POA: Diagnosis not present

## 2013-03-23 DIAGNOSIS — G8929 Other chronic pain: Secondary | ICD-10-CM | POA: Diagnosis not present

## 2013-03-23 DIAGNOSIS — Z8659 Personal history of other mental and behavioral disorders: Secondary | ICD-10-CM | POA: Insufficient documentation

## 2013-03-23 DIAGNOSIS — Z8619 Personal history of other infectious and parasitic diseases: Secondary | ICD-10-CM | POA: Insufficient documentation

## 2013-03-23 DIAGNOSIS — R0602 Shortness of breath: Secondary | ICD-10-CM | POA: Insufficient documentation

## 2013-03-23 DIAGNOSIS — R110 Nausea: Secondary | ICD-10-CM | POA: Diagnosis not present

## 2013-03-23 DIAGNOSIS — R209 Unspecified disturbances of skin sensation: Secondary | ICD-10-CM | POA: Diagnosis not present

## 2013-03-23 DIAGNOSIS — F32A Depression, unspecified: Secondary | ICD-10-CM | POA: Diagnosis present

## 2013-03-23 DIAGNOSIS — F17200 Nicotine dependence, unspecified, uncomplicated: Secondary | ICD-10-CM | POA: Diagnosis not present

## 2013-03-23 DIAGNOSIS — F1010 Alcohol abuse, uncomplicated: Secondary | ICD-10-CM | POA: Diagnosis present

## 2013-03-23 DIAGNOSIS — Z8719 Personal history of other diseases of the digestive system: Secondary | ICD-10-CM | POA: Insufficient documentation

## 2013-03-23 DIAGNOSIS — R200 Anesthesia of skin: Secondary | ICD-10-CM | POA: Diagnosis present

## 2013-03-23 DIAGNOSIS — R51 Headache: Secondary | ICD-10-CM | POA: Insufficient documentation

## 2013-03-23 DIAGNOSIS — F1410 Cocaine abuse, uncomplicated: Secondary | ICD-10-CM | POA: Diagnosis present

## 2013-03-23 DIAGNOSIS — F319 Bipolar disorder, unspecified: Secondary | ICD-10-CM | POA: Diagnosis present

## 2013-03-23 DIAGNOSIS — R519 Headache, unspecified: Secondary | ICD-10-CM

## 2013-03-23 DIAGNOSIS — R202 Paresthesia of skin: Secondary | ICD-10-CM | POA: Diagnosis present

## 2013-03-23 DIAGNOSIS — F329 Major depressive disorder, single episode, unspecified: Secondary | ICD-10-CM | POA: Diagnosis present

## 2013-03-23 DIAGNOSIS — F419 Anxiety disorder, unspecified: Secondary | ICD-10-CM | POA: Diagnosis present

## 2013-03-23 LAB — BASIC METABOLIC PANEL
BUN: 11 mg/dL (ref 6–23)
CO2: 23 mEq/L (ref 19–32)
Calcium: 8.5 mg/dL (ref 8.4–10.5)
Chloride: 104 mEq/L (ref 96–112)
Creatinine, Ser: 1.02 mg/dL (ref 0.50–1.35)
GFR calc Af Amer: >90 mL/min (ref >90)
GFR calc non Af Amer: 83 mL/min — ABNORMAL LOW (ref >90)
Glucose, Bld: 105 mg/dL — ABNORMAL HIGH (ref 70–99)
Potassium: 3.4 mEq/L — ABNORMAL LOW (ref 3.5–5.1)
Sodium: 137 mEq/L (ref 135–145)

## 2013-03-23 LAB — CBC
HCT: 41.1 % (ref 39.0–52.0)
Hemoglobin: 14.5 g/dL (ref 13.0–17.0)
MCH: 29.2 pg (ref 26.0–34.0)
MCHC: 35.3 g/dL (ref 30.0–36.0)
MCV: 82.7 fL (ref 78.0–100.0)
Platelets: 201 10*3/uL (ref 150–400)
RBC: 4.97 MIL/uL (ref 4.22–5.81)
RDW: 14.7 % (ref 11.5–15.5)
WBC: 7.8 10*3/uL (ref 4.0–10.5)

## 2013-03-23 LAB — URINE CULTURE: Colony Count: 3000

## 2013-03-23 LAB — TROPONIN I: Troponin I: <0.3 ng/mL (ref <0.30)

## 2013-03-23 NOTE — Progress Notes (Signed)
Despite offers for antianxiety meds pt is refusing MRI exam.

## 2013-03-23 NOTE — ED Provider Notes (Signed)
CSN: [id number]     Arrival date & time 03/23/13  1010 History   First MD Initiated Contact with Patient 03/23/13 1024     Chief Complaint  Patient presents with  . Chest Pain   (Consider location/radiation/quality/duration/timing/severity/associated sxs/prior Treatment) HPI Comments: Patient presents to the ED after signing himself out of cardiac ICU this morning.  He states that he was getting "no better" and "they were not helping me" so he left.  While attempting to leave, he reports that he was too weak to walk and was then talked into checking back in to the ED since he had been removed from the computer.  He reports continued headache (full head) which he describes as like a migraine without aura, photophobia, nausea or vomiting.  He reports left anterior parasternal chest pain without radiation.  He reports mild SHOB associated with this but denies nausea, diaphoresis.  He states no interventions given on the floor have helped with his pain.  He reports no further drug or alcohol use.  He also reports right arm numbness but reports only generalized weakness.  Denies speech difficulties, difficulty swallowing, or right leg numbness or weakness.  Patient is a 52 y.o. male presenting with chest pain. The history is provided by the patient. No language interpreter was used.  Chest Pain Pain location:  L chest Pain quality: crushing, sharp and stabbing   Pain quality: not radiating   Pain radiates to:  Does not radiate Pain radiates to the back: no   Pain severity:  Moderate Onset quality:  Gradual Duration:  1 day Timing:  Constant Progression:  Unchanged Chronicity:  New Context: drug use   Context: not breathing, not lifting, no movement, not at rest, no stress and no trauma   Relieved by:  Nothing Worsened by:  Deep breathing and movement Ineffective treatments:  Aspirin, nitroglycerin, oxygen and rest Associated symptoms: headache, nausea, numbness, shortness of breath and  weakness   Associated symptoms: no abdominal pain, no altered mental status, no back pain, no cough, no dizziness, no dysphagia, no fever, no near-syncope, no orthopnea, no palpitations and not vomiting     Past Medical History  Diagnosis Date  . Arthritis   . Hypertension   . Anxiety   . Bipolar affective disorder   . Depression   . Alcohol abuse   . Cocaine abuse     smokes crack-cocaine  . GERD (gastroesophageal reflux disease)   . Chronic back pain   . Tobacco abuse   . Hepatitis C   . Vitamin D deficiency   . Gout   . Hemorrhoids   . Diabetes mellitus without complication    History reviewed. No pertinent past surgical history. Family History  Problem Relation Age of Onset  . Hypertension Brother   . Diabetes Brother   . Hypertension Mother   . Diabetes Mother   . Arthritis Mother   . Other Sister     bone disease   History  Substance Use Topics  . Smoking status: Current Every Day Smoker -- 0.25 packs/day    Types: Cigarettes    Last Attempt to Quit: 11/23/2011  . Smokeless tobacco: Not on file  . Alcohol Use: Yes     Comment: 24 oz--3x's wkly     Review of Systems  Constitutional: Negative for fever.  HENT: Negative for trouble swallowing.   Respiratory: Positive for shortness of breath. Negative for cough.   Cardiovascular: Positive for chest pain. Negative for palpitations, orthopnea and near-syncope.  Gastrointestinal: Positive for nausea. Negative for vomiting and abdominal pain.  Musculoskeletal: Negative for back pain.  Neurological: Positive for weakness, numbness and headaches. Negative for dizziness.  All other systems reviewed and are negative.    Allergies  Review of patient's allergies indicates no known allergies.  Home Medications  No current outpatient prescriptions on file. BP 149/78  Pulse 101  Temp(Src) 98.4 F (36.9 C) (Oral)  Resp 20  Ht 5\' 7"  (1.702 m)  Wt 270 lb (122.471 kg)  BMI 42.28 kg/m2  SpO2 96% Physical Exam    Nursing note and vitals reviewed. Constitutional: He is oriented to person, place, and time. He appears well-developed and well-nourished. No distress.  HENT:  Head: Normocephalic and atraumatic.  Right Ear: External ear normal.  Left Ear: External ear normal.  Nose: Nose normal.  Mouth/Throat: Oropharynx is clear and moist. No oropharyngeal exudate.  Eyes: Conjunctivae are normal. Pupils are equal, round, and reactive to light. No scleral icterus.  Neck: Normal range of motion. Neck supple. No JVD present.  Cardiovascular: Normal rate, regular rhythm and normal heart sounds.  Exam reveals no gallop and no friction rub.   No murmur heard. Pulmonary/Chest: Effort normal. No respiratory distress. He has no wheezes. He has no rales. He exhibits tenderness.    Abdominal: Soft. Bowel sounds are normal. He exhibits no distension. There is no tenderness.  Musculoskeletal: Normal range of motion. He exhibits no edema and no tenderness.  Neurological: He is alert and oriented to person, place, and time. He exhibits normal muscle tone. Coordination normal.  Skin: Skin is warm and dry. No rash noted. No erythema. No pallor.  Psychiatric: He has a normal mood and affect. His behavior is normal. Judgment and thought content normal.    ED Course  Procedures (including critical care time) Labs Review Labs Reviewed - No data to display Imaging Review Dg Chest 2 View  03/22/2013   CLINICAL DATA:  Chest pain  EXAM: CHEST  2 VIEW  COMPARISON:  10/17/2012  FINDINGS: The heart size and mediastinal contours are within normal limits. Both lungs are clear. The visualized skeletal structures are unremarkable.  IMPRESSION: No active cardiopulmonary disease.   Electronically Signed   By: Ruel Favors M.D.   On: 03/22/2013 07:40   Ct Head Wo Contrast  03/22/2013   CLINICAL DATA:  Chest pain and headaches  EXAM: CT HEAD WITHOUT CONTRAST  TECHNIQUE: Contiguous axial images were obtained from the base of the  skull through the vertex without intravenous contrast.  COMPARISON:  07/13/2012  FINDINGS: The bony calvarium is intact. No gross soft tissue abnormality is identified. Ventricles are mildly prominent but stable in appearance from the prior exam. No findings to suggest acute hemorrhage, acute infarction or space-occupying mass lesion are identified.  IMPRESSION: No acute abnormality noted.   Electronically Signed   By: Alcide Clever M.D.   On: 03/22/2013 07:35   Ct Angio Chest W/cm &/or Wo Cm  03/22/2013   CLINICAL DATA:  Acute onset of substernal chest pain and diaphoresis.  EXAM: CT ANGIOGRAPHY CHEST WITH CONTRAST  TECHNIQUE: Multidetector CT imaging of the chest was performed using the standard protocol during bolus administration of intravenous contrast. Multiplanar CT image reconstructions including MIPs were obtained to evaluate the vascular anatomy.  CONTRAST:  75mL OMNIPAQUE IOHEXOL 350 MG/ML SOLN  COMPARISON:  Chest radiograph, 10/17/2012  FINDINGS: No evidence of a pulmonary embolus.  The heart is normal in size and configuration. There are mild coronary artery calcifications.  The great vessels are normal in caliber. No mediastinal or hilar masses or pathologically enlarged lymph nodes.  Lungs show dependent subsegmental atelectasis. No focal consolidation. No pulmonary edema. No pleural effusion or pneumothorax is seen  Limited evaluation of the upper abdomen is unremarkable.  No significant bony abnormality.  Review of the MIP images confirms the above findings.  IMPRESSION: 1. No evidence of a pulmonary embolus. 2. No acute findings. 3. Dependent bilateral lung subsegmental atelectasis. No edema or convincing infiltrate.   Electronically Signed   By: Amie Portland M.D.   On: 03/22/2013 07:39    EKG Interpretation   None      Results for orders placed during the hospital encounter of 03/22/13  MRSA PCR SCREENING      Result Value Range   MRSA by PCR POSITIVE (*) NEGATIVE  CBC      Result  Value Range   WBC 14.6 (*) 4.0 - 10.5 K/uL   RBC 5.15  4.22 - 5.81 MIL/uL   Hemoglobin 15.1  13.0 - 17.0 g/dL   HCT 40.9  81.1 - 91.4 %   MCV 81.9  78.0 - 100.0 fL   MCH 29.3  26.0 - 34.0 pg   MCHC 35.8  30.0 - 36.0 g/dL   RDW 78.2  95.6 - 21.3 %   Platelets 245  150 - 400 K/uL  BASIC METABOLIC PANEL      Result Value Range   Sodium 137  135 - 145 mEq/L   Potassium 3.9  3.5 - 5.1 mEq/L   Chloride 103  96 - 112 mEq/L   CO2 17 (*) 19 - 32 mEq/L   Glucose, Bld 96  70 - 99 mg/dL   BUN 14  6 - 23 mg/dL   Creatinine, Ser 0.86  0.50 - 1.35 mg/dL   Calcium 9.8  8.4 - 57.8 mg/dL   GFR calc non Af Amer 72 (*) >90 mL/min   GFR calc Af Amer 84 (*) >90 mL/min  TROPONIN I      Result Value Range   Troponin I <0.30  <0.30 ng/mL  PRO B NATRIURETIC PEPTIDE      Result Value Range   Pro B Natriuretic peptide (BNP) 120.9  0 - 125 pg/mL  TROPONIN I      Result Value Range   Troponin I <0.30  <0.30 ng/mL  URINE RAPID DRUG SCREEN (HOSP PERFORMED)      Result Value Range   Opiates POSITIVE (*) NONE DETECTED   Cocaine POSITIVE (*) NONE DETECTED   Benzodiazepines NONE DETECTED  NONE DETECTED   Amphetamines NONE DETECTED  NONE DETECTED   Tetrahydrocannabinol POSITIVE (*) NONE DETECTED   Barbiturates NONE DETECTED  NONE DETECTED  URINALYSIS, ROUTINE W REFLEX MICROSCOPIC      Result Value Range   Color, Urine YELLOW  YELLOW   APPearance CLEAR  CLEAR   Specific Gravity, Urine 1.037 (*) 1.005 - 1.030   pH 5.5  5.0 - 8.0   Glucose, UA NEGATIVE  NEGATIVE mg/dL   Hgb urine dipstick NEGATIVE  NEGATIVE   Bilirubin Urine NEGATIVE  NEGATIVE   Ketones, ur 40 (*) NEGATIVE mg/dL   Protein, ur NEGATIVE  NEGATIVE mg/dL   Urobilinogen, UA 0.2  0.0 - 1.0 mg/dL   Nitrite NEGATIVE  NEGATIVE   Leukocytes, UA NEGATIVE  NEGATIVE  TROPONIN I      Result Value Range   Troponin I <0.30  <0.30 ng/mL  TROPONIN I  Result Value Range   Troponin I <0.30  <0.30 ng/mL  TROPONIN I      Result Value Range    Troponin I <0.30  <0.30 ng/mL  HEPATIC FUNCTION PANEL      Result Value Range   Total Protein 8.5 (*) 6.0 - 8.3 g/dL   Albumin 4.6  3.5 - 5.2 g/dL   AST 161 (*) 0 - 37 U/L   ALT 219 (*) 0 - 53 U/L   Alkaline Phosphatase 68  39 - 117 U/L   Total Bilirubin 0.7  0.3 - 1.2 mg/dL   Bilirubin, Direct 0.2  0.0 - 0.3 mg/dL   Indirect Bilirubin 0.5  0.3 - 0.9 mg/dL  LIPASE, BLOOD      Result Value Range   Lipase 56  11 - 59 U/L  ETHANOL      Result Value Range   Alcohol, Ethyl (B) <11  0 - 11 mg/dL  HEMOGLOBIN W9U      Result Value Range   Hemoglobin A1C 6.3 (*) <5.7 %   Mean Plasma Glucose 134 (*) <117 mg/dL  BASIC METABOLIC PANEL      Result Value Range   Sodium 137  135 - 145 mEq/L   Potassium 3.4 (*) 3.5 - 5.1 mEq/L   Chloride 104  96 - 112 mEq/L   CO2 23  19 - 32 mEq/L   Glucose, Bld 105 (*) 70 - 99 mg/dL   BUN 11  6 - 23 mg/dL   Creatinine, Ser 0.45  0.50 - 1.35 mg/dL   Calcium 8.5  8.4 - 40.9 mg/dL   GFR calc non Af Amer 83 (*) >90 mL/min   GFR calc Af Amer >90  >90 mL/min  CBC      Result Value Range   WBC 7.8  4.0 - 10.5 K/uL   RBC 4.97  4.22 - 5.81 MIL/uL   Hemoglobin 14.5  13.0 - 17.0 g/dL   HCT 81.1  91.4 - 78.2 %   MCV 82.7  78.0 - 100.0 fL   MCH 29.2  26.0 - 34.0 pg   MCHC 35.3  30.0 - 36.0 g/dL   RDW 95.6  21.3 - 08.6 %   Platelets 201  150 - 400 K/uL  POCT I-STAT TROPONIN I      Result Value Range   Troponin i, poc 0.01  0.00 - 0.08 ng/mL   Comment 3            Dg Chest 2 View  03/22/2013   CLINICAL DATA:  Chest pain  EXAM: CHEST  2 VIEW  COMPARISON:  10/17/2012  FINDINGS: The heart size and mediastinal contours are within normal limits. Both lungs are clear. The visualized skeletal structures are unremarkable.  IMPRESSION: No active cardiopulmonary disease.   Electronically Signed   By: Ruel Favors M.D.   On: 03/22/2013 07:40   Ct Head Wo Contrast  03/22/2013   CLINICAL DATA:  Chest pain and headaches  EXAM: CT HEAD WITHOUT CONTRAST  TECHNIQUE:  Contiguous axial images were obtained from the base of the skull through the vertex without intravenous contrast.  COMPARISON:  07/13/2012  FINDINGS: The bony calvarium is intact. No gross soft tissue abnormality is identified. Ventricles are mildly prominent but stable in appearance from the prior exam. No findings to suggest acute hemorrhage, acute infarction or space-occupying mass lesion are identified.  IMPRESSION: No acute abnormality noted.   Electronically Signed   By: Alcide Clever M.D.   On:  03/22/2013 07:35   Ct Angio Chest W/cm &/or Wo Cm  03/22/2013   CLINICAL DATA:  Acute onset of substernal chest pain and diaphoresis.  EXAM: CT ANGIOGRAPHY CHEST WITH CONTRAST  TECHNIQUE: Multidetector CT imaging of the chest was performed using the standard protocol during bolus administration of intravenous contrast. Multiplanar CT image reconstructions including MIPs were obtained to evaluate the vascular anatomy.  CONTRAST:  75mL OMNIPAQUE IOHEXOL 350 MG/ML SOLN  COMPARISON:  Chest radiograph, 10/17/2012  FINDINGS: No evidence of a pulmonary embolus.  The heart is normal in size and configuration. There are mild coronary artery calcifications. The great vessels are normal in caliber. No mediastinal or hilar masses or pathologically enlarged lymph nodes.  Lungs show dependent subsegmental atelectasis. No focal consolidation. No pulmonary edema. No pleural effusion or pneumothorax is seen  Limited evaluation of the upper abdomen is unremarkable.  No significant bony abnormality.  Review of the MIP images confirms the above findings.  IMPRESSION: 1. No evidence of a pulmonary embolus. 2. No acute findings. 3. Dependent bilateral lung subsegmental atelectasis. No edema or convincing infiltrate.   Electronically Signed   By: Amie Portland M.D.   On: 03/22/2013 07:39    12:26 PM I have discussed the care of this patient with the IM Teaching service residents who have come down to see the patient.  They report that  the patient was getting ready to be discharged from the hospital when he left AMA.  The resident and I have discussed the results of his testing with the patient and he does not need further testing while in the ED but may follow up with his PCP at the [name] in [place].  Patient is medically stable for discharge.   MDM  Atypical Chest pain Headache   Patient here after signing out AMA from the IM teaching service in the hospital.  He reports that he felt weak after leaving (and after discussion with the resident who reports that the patient pulled out his IV and took himself off the monitor then walked down the hall, he states that when he saw him, he then leaned into the wall and told them he was "weak").  His work up in the hospital is unremarkable for cardiac or CVA etiology for the chest pain or the headache.  He is not suicidal or homicidal and though his behavior is erratic he does not meet criteria for admission for psychiatric reasons.  Izola Price Marisue Humble, [place] 03/23/13 1232

## 2013-03-23 NOTE — ED Notes (Addendum)
Pt got dressed, was seen walking out ambulance doors- unsteady gait. Staff attempted to stop pt, pt kept walking-went up stairs to parking lot. Continued to have unsteady gait--staff continued to call out to patient to get him to return to the ED. Kept on walking.

## 2013-03-23 NOTE — Consult Note (Signed)
Date: 03/23/2013               Patient Name:  [name] MRN: [id number]  DOB: 1961-03-25 Age / Sex: 52 y.o., male   PCP: Provider Default, MD         Requesting Physician: ED    Consulting Reason:  Patient Predisposition     Chief Complaint: Fall and "migraine"  History of Present Illness: Pt is a 52 yo male with a PMH of MI 8 months ago who presented to the ED with CP on 03/22/13.  He was positive for cocaine on UDS.  He was admitted for chest pain rule out.  However, he left the hospital AMA on the morning of 03/23/13 after security was called on him for being difficult with the nursing staff staff.  He subsequently left AMA and fell down in the parking lot after he reportedly tripped on something.  He was apparently too weak to walk and was then talked into coming back to the ED since he had already been removed from the computer.  When we examined him he reported a HA and stated it was a "migraine" however, when questioning him he denies any h/o migraine.  He was very uncooperative with our exam and refused to answer most of our questions. He stated that he was tired of all of our questions and wanted to be left alone.  He denies any other problems to [place].   Meds: No current facility-administered medications for this encounter.   No current outpatient prescriptions on file.    Allergies: Allergies as of 03/23/2013  . (No Known Allergies)   Past Medical History  Diagnosis Date  . Arthritis   . Hypertension   . Anxiety   . Bipolar affective disorder   . Depression   . Alcohol abuse   . Cocaine abuse     smokes crack-cocaine  . GERD (gastroesophageal reflux disease)   . Chronic back pain   . Tobacco abuse   . Hepatitis C   . Vitamin D deficiency   . Gout   . Hemorrhoids   . Diabetes mellitus without complication    History reviewed. No pertinent past surgical history. Family History  Problem Relation Age of Onset  . Hypertension Brother   . Diabetes Brother   .  Hypertension Mother   . Diabetes Mother   . Arthritis Mother   . Other Sister     bone disease   History   Social History  . Marital Status: Single    Spouse Name: N/A    Number of Children: N/A  . Years of Education: N/A   Occupational History  . Not on file.   Social History Main Topics  . Smoking status: Current Every Day Smoker -- 0.25 packs/day    Types: Cigarettes    Last Attempt to Quit: 11/23/2011  . Smokeless tobacco: Not on file  . Alcohol Use: Yes     Comment: 24 oz--3x's wkly   . Drug Use: Yes    Special: Marijuana, Cocaine  . Sexual Activity: Not Currently   Other Topics Concern  . Not on file   Social History Narrative   Lives alone in [place], [place]    Review of Systems: Review of systems not obtained due to patient factors.  Pt was uncooperative and refused to answer our questions.    Physical Exam: Blood pressure 149/78, pulse 101, temperature 98.4 F (36.9 C), temperature source Oral, resp. rate 20, height 5'  7" (1.702 m), weight 122.471 kg (270 lb), SpO2 96.00%.  Physical Exam  HENT:  Head: Normocephalic and atraumatic.  Cardiovascular: Normal rate, regular rhythm, normal heart sounds and intact distal pulses.   Pulmonary/Chest: Effort normal and breath sounds normal. No respiratory distress.  Abdominal: Soft. Normal appearance and bowel sounds are normal. He exhibits no distension. There is no tenderness.  Neurological: He is alert.  Skin: Skin is warm and dry.  Psychiatric: He is agitated.   Lab results: Basic Metabolic Panel:  Recent Labs  84/13/24 0531 03/23/13 0040  NA 137 137  K 3.9 3.4*  CL 103 104  CO2 17* 23  GLUCOSE 96 105*  BUN 14 11  CREATININE 1.14 1.02  CALCIUM 9.8 8.5   Liver Function Tests:  Recent Labs  03/22/13 1245  AST 123*  ALT 219*  ALKPHOS 68  BILITOT 0.7  PROT 8.5*  ALBUMIN 4.6    Recent Labs  03/22/13 1245  LIPASE 56   No results found for this basename: AMMONIA,  in the last 72  hours CBC:  Recent Labs  03/22/13 0531 03/23/13 0040  WBC 14.6* 7.8  HGB 15.1 14.5  HCT 42.2 41.1  MCV 81.9 82.7  PLT 245 201   Cardiac Enzymes:  Recent Labs  03/22/13 1245 03/22/13 1915 03/23/13 0040  TROPONINI <0.30 <0.30 <0.30   BNP:  Recent Labs  03/22/13 0548  PROBNP 120.9   Hemoglobin A1C:  Recent Labs  03/22/13 1245  HGBA1C 6.3*   Urine Drug Screen: Drugs of Abuse     Component Value Date/Time   LABOPIA POSITIVE* 03/22/2013 1514   COCAINSCRNUR POSITIVE* 03/22/2013 1514   LABBENZ NONE DETECTED 03/22/2013 1514   AMPHETMU NONE DETECTED 03/22/2013 1514   THCU POSITIVE* 03/22/2013 1514   LABBARB NONE DETECTED 03/22/2013 1514    Alcohol Level:  Recent Labs  03/22/13 1245  ETH <11   Urinalysis:  Recent Labs  03/22/13 1514  COLORURINE YELLOW  LABSPEC 1.037*  PHURINE 5.5  GLUCOSEU NEGATIVE  HGBUR NEGATIVE  BILIRUBINUR NEGATIVE  KETONESUR 40*  PROTEINUR NEGATIVE  UROBILINOGEN 0.2  NITRITE NEGATIVE  LEUKOCYTESUR NEGATIVE    Imaging results:  Dg Chest 2 View  03/22/2013   CLINICAL DATA:  Chest pain  EXAM: CHEST  2 VIEW  COMPARISON:  10/17/2012  FINDINGS: The heart size and mediastinal contours are within normal limits. Both lungs are clear. The visualized skeletal structures are unremarkable.  IMPRESSION: No active cardiopulmonary disease.   Electronically Signed   By: Ruel Favors M.D.   On: 03/22/2013 07:40   Ct Head Wo Contrast  03/22/2013   CLINICAL DATA:  Chest pain and headaches  EXAM: CT HEAD WITHOUT CONTRAST  TECHNIQUE: Contiguous axial images were obtained from the base of the skull through the vertex without intravenous contrast.  COMPARISON:  07/13/2012  FINDINGS: The bony calvarium is intact. No gross soft tissue abnormality is identified. Ventricles are mildly prominent but stable in appearance from the prior exam. No findings to suggest acute hemorrhage, acute infarction or space-occupying mass lesion are identified.   IMPRESSION: No acute abnormality noted.   Electronically Signed   By: Alcide Clever M.D.   On: 03/22/2013 07:35   Ct Angio Chest W/cm &/or Wo Cm  03/22/2013   CLINICAL DATA:  Acute onset of substernal chest pain and diaphoresis.  EXAM: CT ANGIOGRAPHY CHEST WITH CONTRAST  TECHNIQUE: Multidetector CT imaging of the chest was performed using the standard protocol during bolus administration of intravenous contrast. Multiplanar  CT image reconstructions including MIPs were obtained to evaluate the vascular anatomy.  CONTRAST:  75mL OMNIPAQUE IOHEXOL 350 MG/ML SOLN  COMPARISON:  Chest radiograph, 10/17/2012  FINDINGS: No evidence of a pulmonary embolus.  The heart is normal in size and configuration. There are mild coronary artery calcifications. The great vessels are normal in caliber. No mediastinal or hilar masses or pathologically enlarged lymph nodes.  Lungs show dependent subsegmental atelectasis. No focal consolidation. No pulmonary edema. No pleural effusion or pneumothorax is seen  Limited evaluation of the upper abdomen is unremarkable.  No significant bony abnormality.  Review of the MIP images confirms the above findings.  IMPRESSION: 1. No evidence of a pulmonary embolus. 2. No acute findings. 3. Dependent bilateral lung subsegmental atelectasis. No edema or convincing infiltrate.   Electronically Signed   By: Amie Portland M.D.   On: 03/22/2013 07:39    Assessment, Plan, & Recommendations by Problem: Principal Problem:   Headache Active Problems:   Anxiety   Bipolar affective disorder   Depression   Alcohol abuse   Hepatitis C   Polysubstance dependence   Cocaine abuse   Numbness and tingling in right hand  Unable to adequately assess the patient and develop an assessment and plan due to uncooperativeness of the patient.  We discussed with our attending and she is in agreement that the patient does not have any current medical issues that need to be addressed medically that would require  hospitalization.   Dispo: We discussed with ED and patient will not be admitted.    Signed: Boykin Peek, MD 03/23/2013, 3:28 PM

## 2013-03-23 NOTE — Progress Notes (Signed)
Subjective:  Patient was agitated overnight. He repeatedly disobeyed nurses requests to stay in bed. He required ~5 mg ativan overnight for agitation, last dose was 11:15 pm.  This AM the patient refused MRI. He was offered antianxiety medicine to help him calm down. He refused this medicine and was making threatening gestures towards the hospital staff. I spoke with the patient about the results of his imaging and lab workup. I explained that we had rule out cardiac and pulmonary causes of chest pain. The patient informed me that the right hand numbness had been present since September. Further this problem has been stable and unchanging.   Patient became very agitated when told that he was going to be able to likely go home later today. Repeatedly stated that no one had done anything for him and that he needed to have a proper workup. I explained at length and multiple times that he had a CT head, CT angiogram of the chest, numerous labs tests and EKGS. I explained that we gave him IV fluids as well as medicine to help him calm down as he presented intoxicated with cocaine.  I explaned that all this was done to make sure that he did not have an acute stroke or a heart attack or a clot in the lungs. The patient continued to irately yell that he still had a headache and still had right hand numbness. I explained that he would need to be followed as an outpatient for these problems. He was dissatisfied with this explanation. He signed out AMA at this time. Securty was escorting him to the exit where someone was coming to pick him up. He fell in the hallway. I approached the patient and he stated that he tripped. The patient was initially limping on his right leg. However, after a few minutes he started limping on his left leg. The team asked the patient to allow [place] to take his vital signs. He refused this. I then spent about 20 minutes talking with the patient and with Dr. Dalphine Handing. He repeated his claim that  we had done nothing for him and were kicking him to the curb. Dr.Bbhardwaj explained all the things that we had done for him. The patient continued to be upset. He was alert and oreinted to person place and time. He denied HI/and SI.He decided to exit the hospital to get a ride home.   Objective: Vital signs in last 24 hours: Filed Vitals:   03/22/13 2007 03/22/13 2316 03/23/13 0307 03/23/13 0729  BP: 127/71 119/75 115/77 116/77  Pulse: 112 84 89   Temp: 97.6 F (36.4 C) 97 F (36.1 C) 96.8 F (36 C) 98.6 F (37 C)  TempSrc: Oral Oral Oral Oral  Resp: 19 15 16    SpO2: 96% 97% 98% 98%   Weight change:   Intake/Output Summary (Last 24 hours) at 03/23/13 1096 Last data filed at 03/23/13 0700  Gross per 24 hour  Intake 2593.75 ml  Output    800 ml  Net 1793.75 ml   Constitutional: He is oriented to person, place, and time. He appears well-developed and well-nourished. No distress.  HENT:  Head: Normocephalic and atraumatic.  Mouth/Throat: Oropharynx is clear and moist. No oropharyngeal exudate.  Eyes: EOM are normal. Pupils are equal, round, and reactive to light.  Cardiovascular: Regular rhythm, normal heart sounds and intact distal pulses. Exam reveals no gallop and no friction rub.  No murmur heard. Tachycardic to 105  Pulmonary/Chest: Effort normal and breath  sounds normal. No respiratory distress. He has no wheezes. He has no rales. He exhibits no tenderness.  Abdominal: Soft. Bowel sounds are normal. He exhibits no distension. There is no tenderness.  Musculoskeletal: He exhibits no edema and no tenderness.  Neurological: He is alert and oriented to person, place, and time. No cranial nerve deficit.  Muscles of UE and LE are 5/5. Decreased sensation in right hand. Patient appears to be falsely limping on one leg and then the other. He intermittently claims right hand weakness but is able to use hand to open doors and pull himself up on bed without any evidence of weakness.    Skin: He is not diaphoretic.  Psychiatric:  intoxicated    Lab Results: Basic Metabolic Panel:  Recent Labs Lab 03/22/13 0531 03/23/13 0040  NA 137 137  K 3.9 3.4*  CL 103 104  CO2 17* 23  GLUCOSE 96 105*  BUN 14 11  CREATININE 1.14 1.02  CALCIUM 9.8 8.5   Liver Function Tests:  Recent Labs Lab 03/22/13 1245  AST 123*  ALT 219*  ALKPHOS 68  BILITOT 0.7  PROT 8.5*  ALBUMIN 4.6    Recent Labs Lab 03/22/13 1245  LIPASE 56   No results found for this basename: AMMONIA,  in the last 168 hours CBC:  Recent Labs Lab 03/22/13 0531 03/23/13 0040  WBC 14.6* 7.8  HGB 15.1 14.5  HCT 42.2 41.1  MCV 81.9 82.7  PLT 245 201   Cardiac Enzymes:  Recent Labs Lab 03/22/13 1245 03/22/13 1915 03/23/13 0040  TROPONINI <0.30 <0.30 <0.30   BNP:  Recent Labs Lab 03/22/13 0548  PROBNP 120.9   D-Dimer: No results found for this basename: DDIMER,  in the last 168 hours CBG: No results found for this basename: GLUCAP,  in the last 168 hours Hemoglobin A1C:  Recent Labs Lab 03/22/13 1245  HGBA1C 6.3*   Fasting Lipid Panel: No results found for this basename: CHOL, HDL, LDLCALC, TRIG, CHOLHDL, LDLDIRECT,  in the last 168 hours Thyroid Function Tests: No results found for this basename: TSH, T4TOTAL, FREET4, T3FREE, THYROIDAB,  in the last 168 hours Coagulation: No results found for this basename: LABPROT, INR,  in the last 168 hours Anemia Panel: No results found for this basename: VITAMINB12, FOLATE, FERRITIN, TIBC, IRON, RETICCTPCT,  in the last 168 hours Urine Drug Screen: Drugs of Abuse     Component Value Date/Time   LABOPIA POSITIVE* 03/22/2013 1514   COCAINSCRNUR POSITIVE* 03/22/2013 1514   LABBENZ NONE DETECTED 03/22/2013 1514   AMPHETMU NONE DETECTED 03/22/2013 1514   THCU POSITIVE* 03/22/2013 1514   LABBARB NONE DETECTED 03/22/2013 1514    Alcohol Level:  Recent Labs Lab 03/22/13 1245  ETH <11   Urinalysis:  Recent Labs Lab  03/22/13 1514  COLORURINE YELLOW  LABSPEC 1.037*  PHURINE 5.5  GLUCOSEU NEGATIVE  HGBUR NEGATIVE  BILIRUBINUR NEGATIVE  KETONESUR 40*  PROTEINUR NEGATIVE  UROBILINOGEN 0.2  NITRITE NEGATIVE  LEUKOCYTESUR NEGATIVE   Misc. Labs:   Micro Results: Recent Results (from the past 240 hour(s))  MRSA PCR SCREENING     Status: Abnormal   Collection Time    03/22/13  5:37 PM      Result Value Range Status   MRSA by PCR POSITIVE (*) NEGATIVE Final   Comment:            The GeneXpert MRSA Assay (FDA     approved for NASAL specimens     only), is one  component of a     comprehensive MRSA colonization     surveillance program. It is not     intended to diagnose MRSA     infection nor to guide or     monitor treatment for     MRSA infections.     RESULT CALLED TO, READ BACK BY AND VERIFIED WITH:     Delphia Grates RN [id number] 03/22/13 BY Miquel Dunn   Studies/Results: Dg Chest 2 View  03/22/2013   CLINICAL DATA:  Chest pain  EXAM: CHEST  2 VIEW  COMPARISON:  10/17/2012  FINDINGS: The heart size and mediastinal contours are within normal limits. Both lungs are clear. The visualized skeletal structures are unremarkable.  IMPRESSION: No active cardiopulmonary disease.   Electronically Signed   By: Ruel Favors M.D.   On: 03/22/2013 07:40   Ct Head Wo Contrast  03/22/2013   CLINICAL DATA:  Chest pain and headaches  EXAM: CT HEAD WITHOUT CONTRAST  TECHNIQUE: Contiguous axial images were obtained from the base of the skull through the vertex without intravenous contrast.  COMPARISON:  07/13/2012  FINDINGS: The bony calvarium is intact. No gross soft tissue abnormality is identified. Ventricles are mildly prominent but stable in appearance from the prior exam. No findings to suggest acute hemorrhage, acute infarction or space-occupying mass lesion are identified.  IMPRESSION: No acute abnormality noted.   Electronically Signed   By: Alcide Clever M.D.   On: 03/22/2013 07:35   Ct Angio Chest W/cm  &/or Wo Cm  03/22/2013   CLINICAL DATA:  Acute onset of substernal chest pain and diaphoresis.  EXAM: CT ANGIOGRAPHY CHEST WITH CONTRAST  TECHNIQUE: Multidetector CT imaging of the chest was performed using the standard protocol during bolus administration of intravenous contrast. Multiplanar CT image reconstructions including MIPs were obtained to evaluate the vascular anatomy.  CONTRAST:  75mL OMNIPAQUE IOHEXOL 350 MG/ML SOLN  COMPARISON:  Chest radiograph, 10/17/2012  FINDINGS: No evidence of a pulmonary embolus.  The heart is normal in size and configuration. There are mild coronary artery calcifications. The great vessels are normal in caliber. No mediastinal or hilar masses or pathologically enlarged lymph nodes.  Lungs show dependent subsegmental atelectasis. No focal consolidation. No pulmonary edema. No pleural effusion or pneumothorax is seen  Limited evaluation of the upper abdomen is unremarkable.  No significant bony abnormality.  Review of the MIP images confirms the above findings.  IMPRESSION: 1. No evidence of a pulmonary embolus. 2. No acute findings. 3. Dependent bilateral lung subsegmental atelectasis. No edema or convincing infiltrate.   Electronically Signed   By: Amie Portland M.D.   On: 03/22/2013 07:39   Medications: I have reviewed the patient's current medications. Scheduled Meds: . aspirin EC  81 mg Oral Daily  . Chlorhexidine Gluconate Cloth  6 each Topical Q0600  . folic acid  1 mg Oral Daily  . heparin  5,000 Units Subcutaneous Q8H  . multivitamin with minerals  1 tablet Oral Daily  . mupirocin ointment  1 application Nasal BID  . banana bag IV 1000 mL   Intravenous Once  . sodium chloride  3 mL Intravenous Q12H  . thiamine  100 mg Intravenous Daily  . thiamine  100 mg Oral Daily   Continuous Infusions: . dextrose 5 % and 0.9% NaCl 50 mL (03/22/13 2027)   PRN Meds:.acetaminophen, acetaminophen, promethazine Assessment/Plan: Principal Problem:   Chest pain Active  Problems:   Alcohol abuse   Polysubstance abuse   GERD (gastroesophageal reflux  disease)   Hepatitis C   Cocaine abuse   Dysuria   Headache   Numbness and tingling in right hand  Chest Pain  The patient likely has cocaine induced chest pain. Initial troponin is negative. EKG with possible inferior injury. CTA negative for aortic dissection or PE. No evidence of PNA. Patient given ASA 325 mg in ED. Nitro x 2 by EMS with improvement of pain. Patient reports very mild chest pain -Trend troponins x 3 (all negative) - BZD morphine and phenergan prn  - Consult cardiology if positive trop + or new findings.  - Oxygen  - Tele  - Repeat EKG tomorrow am demonstrated no acute findings. - No BB given cocaine use  - Lipase Normal  - LFTs mild elevations  - Patient tolerated food without problems.  R. Hand numbness in setting of headache  Patient has 5/5 strength in right hand and arm. Mild decrease in sensation in right hand. This has been stable for last 3-4 months. The headache may also be due to NTG given by EMS. In terms of CVA, CT negative in ED and patient is outside of the treatment window for TPA. Consulted neurology if persistent numbness who recommended MR. Of note that this was before the patient informed us that the numbness had been present for last 3-4 months. Patient refused MR. There is no concern for acute stroke given stable neuro exam and stable mild numbness for the last 3-4 months with no acute findigns on CT. - Neuro exams q 2  - Consulted neurology   Dysuria - resolved UA demonstrated  High specific gravity, ketones 40, no signs of infection. Likely due to dehydration in setting of polysubstance drug use.  Polysubstance Drug Abuse  Patient was placed on CIWA originally. however he had stable vital signs and slept through the night without requiring an BZD. BZD was discontinued in the AM.  - Banana Bag  - Will consult SW for counseling eval  - UDS (cocaine, THC, opiates)    - Ethanol <11   Bipolar disorder/Psych Patient was aggressive with staff . Patient would not follow instructions. Denied HI/SI. Denied active hallucinations. Patient was alert and oriented to person place and time. Patient was sleeping without problems overnight. Patient likely has psychiatric problems necessitating outpatient management. His PCP has suggested for him to take his psych meds, but he has refused for the last month. I recommend that he follow up with a psychiatrist.  Hepatitis C  Tolerating food without problem. AST 123, ALT 219 Lipase 56  DM  Not on home meds. Will check A1c.   GERD  IV PPI   Dispo: Disposition is deferred at this time, awaiting improvement of current medical problems.  Anticipated discharge in approximately 1 day(s).   The patient does have a current PCP (Provider Default, MD) and does not need an [facility] hospital follow-up appointment after discharge.  The patient does have transportation limitations that hinder transportation to clinic appointments.  .Services Needed at time of discharge: Y = Yes, Blank = No PT:   OT:   RN:   Equipment:   Other:     LOS: 1 day   Pleas Koch, MD 03/23/2013, 8:29 AM

## 2013-03-23 NOTE — ED Notes (Signed)
The pt was brought by the [facility] from cardiac icu after he left AMA. Pt was unable to be checked back in there because he had been discharged from the system. Pt was admitted there for cocaine and ETOH withdrawal and CP. When pt tried to leave he was too weak to walk so staff talked him into coming back to ed to check in. The pt will not talk to me now but he is tearful

## 2013-03-23 NOTE — Discharge Summary (Signed)
Name: [name] MRN: [id number] DOB: 1960-06-25 52 y.o. PCP: Provider Default, MD  Date of Admission: 03/22/2013  5:09 AM Date of Discharge: 03/23/2013 Attending Physician: No att. providers found  Discharge Diagnosis:  Principal Problem:   Chest pain Active Problems:   Alcohol abuse   Polysubstance abuse   GERD (gastroesophageal reflux disease)   Hepatitis C   Cocaine abuse   Dysuria   Headache   Numbness and tingling in right hand  Discharge Medications:   Medication List    Notice   You have not been prescribed any medications.      Disposition and follow-up:   [name] was left AMA. At the hospital follow up visit please address:  1.  Bipolar Disorder/Psych , Right Hand Numbness, Headache, Polysubstance Drug Use  2.  Labs / imaging needed at time of follow-up: None.  3.  Pending labs/ test needing follow-up: None.  Follow-up Appointments:  Patient left AMA.  Discharge Instructions:  Patient left AMA  Consultations: Treatment Team:  Kym Groom, MD  Procedures Performed:  Dg Chest 2 View  03/22/2013   CLINICAL DATA:  Chest pain  EXAM: CHEST  2 VIEW  COMPARISON:  10/17/2012  FINDINGS: The heart size and mediastinal contours are within normal limits. Both lungs are clear. The visualized skeletal structures are unremarkable.  IMPRESSION: No active cardiopulmonary disease.   Electronically Signed   By: Ruel Favors M.D.   On: 03/22/2013 07:40   Ct Head Wo Contrast  03/22/2013   CLINICAL DATA:  Chest pain and headaches  EXAM: CT HEAD WITHOUT CONTRAST  TECHNIQUE: Contiguous axial images were obtained from the base of the skull through the vertex without intravenous contrast.  COMPARISON:  07/13/2012  FINDINGS: The bony calvarium is intact. No gross soft tissue abnormality is identified. Ventricles are mildly prominent but stable in appearance from the prior exam. No findings to suggest acute hemorrhage, acute infarction or space-occupying  mass lesion are identified.  IMPRESSION: No acute abnormality noted.   Electronically Signed   By: Alcide Clever M.D.   On: 03/22/2013 07:35   Ct Angio Chest W/cm &/or Wo Cm  03/22/2013   CLINICAL DATA:  Acute onset of substernal chest pain and diaphoresis.  EXAM: CT ANGIOGRAPHY CHEST WITH CONTRAST  TECHNIQUE: Multidetector CT imaging of the chest was performed using the standard protocol during bolus administration of intravenous contrast. Multiplanar CT image reconstructions including MIPs were obtained to evaluate the vascular anatomy.  CONTRAST:  75mL OMNIPAQUE IOHEXOL 350 MG/ML SOLN  COMPARISON:  Chest radiograph, 10/17/2012  FINDINGS: No evidence of a pulmonary embolus.  The heart is normal in size and configuration. There are mild coronary artery calcifications. The great vessels are normal in caliber. No mediastinal or hilar masses or pathologically enlarged lymph nodes.  Lungs show dependent subsegmental atelectasis. No focal consolidation. No pulmonary edema. No pleural effusion or pneumothorax is seen  Limited evaluation of the upper abdomen is unremarkable.  No significant bony abnormality.  Review of the MIP images confirms the above findings.  IMPRESSION: 1. No evidence of a pulmonary embolus. 2. No acute findings. 3. Dependent bilateral lung subsegmental atelectasis. No edema or convincing infiltrate.   Electronically Signed   By: Amie Portland M.D.   On: 03/22/2013 07:39   Admission HPI:   CHRISTAPHER [name] is 52 y.o. man with pmhx of MI 8 months ago ([place]?) who presents to ED with a cc of chest pain. The pain began around 3AM  after consuming two alcoholic beverages, marijuana, and $60 dollars worth of cocaine. The patient is intoxicated on interview. The chest pain is substernal and radiates to his right shoulder. He has associated symptoms of headache and right hand numbness. In the ED, head CT negative, troponin negative. EKG no stemi. ED consulted for admission for chest pain rule out.    On ROS, positve for diarrhea for 2 days. No SOB, no LE swelling. + for dysuria for the last few days.    Hospital Course by problem list: Principal Problem:   Chest pain Active Problems:   Alcohol abuse   Polysubstance abuse   GERD (gastroesophageal reflux disease)   Hepatitis C   Cocaine abuse   Dysuria   Headache   Numbness and tingling in right hand   Patient was agitated overnight. He repeatedly disobeyed nurses requests to stay in bed. He required ~5 mg ativan overnight for agitation, last dose was 11:15 pm. This AM the patient refused MRI. He was offered antianxiety medicine to help him calm down. He refused this medicine and was making threatening gestures towards the hospital staff. I spoke with the patient about the results of his imaging and lab workup. I explained that we had ruled out cardiac and pulmonary causes of chest pain. The patient informed me that the right hand numbness had been present since September. Further this problem has been stable and unchanging.   Patient became very agitated when told that he was going to be able to likely go home later today. Repeatedly stated that no one had done anything for him and that he needed to have a proper workup. I explained at length and multiple times that he had a CT head, CT angiogram of the chest, numerous labs tests and EKGS. I explained that we gave him IV fluids as well as medicine to help him calm down as he presented intoxicated with cocaine. I explaned that all this was done to make sure that he did not have an acute stroke or a heart attack or a clot in the lungs. The patient continued to irately yell that he still had a headache and still had right hand numbness. I explained that he would need to be followed as an outpatient for these problems. He was dissatisfied with this explanation. He signed out AMA at this time. Securty was escorting him to the exit where someone was coming to pick him up. He fell in the hallway. I  approached the patient and he stated that he tripped. The patient was initially limping on his right leg. However, after a few minutes he started limping on his left leg. The team asked the patient to allow [place] to take his vital signs. He refused this. I then spent about 20 minutes talking with the patient and with Dr. Dalphine Handing. He repeated his claim that we had done nothing for him and were kicking him to the curb. Dr.Bhardwaj explained all the things that we had done for him. The patient continued to be upset. He was alert and oreinted to person place and time. He denied HI/and SI.He decided to exit the hospital to get a ride home.   Chest Pain  The patient likely has cocaine induced chest pain. Troponisn were negative. EKG with possible inferior injury. CTA negative for aortic dissection or PE. No evidence of PNA. Patient given ASA 325 mg in ED. Nitro x 2 by EMS with improvement of pain. Patient reports very mild chest pain. We repeated EKG  in am that demonstrated no acute findings. No BB given cocaine use. Lipase was normal. LFTs showed mild elevations  Patient tolerated food without problems.   R. Hand numbness in setting of headache  Patient has 5/5 strength in right hand and arm. Mild decrease in sensation in right hand. This has been stable for last 3-4 months. The headache may also be due to NTG given by EMS. In terms of CVA, CT negative in ED and patient is outside of the treatment window for TPA. Consulted neurology who recommended MR. Of note that this was before the patient informed us that the numbness had been present for last 3-4 months. Patient refused MR. There is no concern for acute stroke given stable neuro exam and stable mild numbness for the last 3-4 months with no acute findigns on CT.   Dysuria - resolved  UA demonstrated High specific gravity, ketones 40, no signs of infection. Likely due to dehydration in setting of polysubstance drug use.   Polysubstance Drug Abuse  Patient  was placed on CIWA originally. however he had stable vital signs and slept through the night without requiring an BZD. BZD was discontinued in the AM. Patient was given Banana Bag. We planned to consult SW for counseling eval but patient left AMA - UDS (cocaine, THC, opiates) at admission - Ethanol <11 at admission  Bipolar disorder/Psych  Patient was aggressive with staff . Patient would not follow instructions. Denied HI/SI. Denied active hallucinations. Patient was alert and oriented to person place and time. Patient was sleeping without problems overnight. Patient likely has psychiatric problems necessitating outpatient management. His PCP has suggested for him to take his psych meds, but he has refused for the last month. I recommend that he follow up with a psychiatrist and his PCP. The patient left AMA before appointments could be made for him.  Hepatitis C  Tolerating food without problem.  AST 123, ALT 219  Lipase 56   DM  Not on home meds.   GERD  IV PPI      Discharge Vitals:   BP 116/77  Pulse 89  Temp(Src) 98.6 F (37 C) (Oral)  Resp 16  SpO2 98%  Discharge Labs:  Results for orders placed during the hospital encounter of 03/22/13 (from the past 24 hour(s))  TROPONIN I     Status: None   Collection Time    03/22/13 12:45 PM      Result Value Range   Troponin I <0.30  <0.30 ng/mL  HEPATIC FUNCTION PANEL     Status: Abnormal   Collection Time    03/22/13 12:45 PM      Result Value Range   Total Protein 8.5 (*) 6.0 - 8.3 g/dL   Albumin 4.6  3.5 - 5.2 g/dL   AST 161 (*) 0 - 37 U/L   ALT 219 (*) 0 - 53 U/L   Alkaline Phosphatase 68  39 - 117 U/L   Total Bilirubin 0.7  0.3 - 1.2 mg/dL   Bilirubin, Direct 0.2  0.0 - 0.3 mg/dL   Indirect Bilirubin 0.5  0.3 - 0.9 mg/dL  LIPASE, BLOOD     Status: None   Collection Time    03/22/13 12:45 PM      Result Value Range   Lipase 56  11 - 59 U/L  ETHANOL     Status: None   Collection Time    03/22/13 12:45 PM       Result Value Range   Alcohol,  Ethyl (B) <11  0 - 11 mg/dL  HEMOGLOBIN Z6X     Status: Abnormal   Collection Time    03/22/13 12:45 PM      Result Value Range   Hemoglobin A1C 6.3 (*) <5.7 %   Mean Plasma Glucose 134 (*) <117 mg/dL  URINE RAPID DRUG SCREEN (HOSP PERFORMED)     Status: Abnormal   Collection Time    03/22/13  3:14 PM      Result Value Range   Opiates POSITIVE (*) NONE DETECTED   Cocaine POSITIVE (*) NONE DETECTED   Benzodiazepines NONE DETECTED  NONE DETECTED   Amphetamines NONE DETECTED  NONE DETECTED   Tetrahydrocannabinol POSITIVE (*) NONE DETECTED   Barbiturates NONE DETECTED  NONE DETECTED  URINALYSIS, ROUTINE W REFLEX MICROSCOPIC     Status: Abnormal   Collection Time    03/22/13  3:14 PM      Result Value Range   Color, Urine YELLOW  YELLOW   APPearance CLEAR  CLEAR   Specific Gravity, Urine 1.037 (*) 1.005 - 1.030   pH 5.5  5.0 - 8.0   Glucose, UA NEGATIVE  NEGATIVE mg/dL   Hgb urine dipstick NEGATIVE  NEGATIVE   Bilirubin Urine NEGATIVE  NEGATIVE   Ketones, ur 40 (*) NEGATIVE mg/dL   Protein, ur NEGATIVE  NEGATIVE mg/dL   Urobilinogen, UA 0.2  0.0 - 1.0 mg/dL   Nitrite NEGATIVE  NEGATIVE   Leukocytes, UA NEGATIVE  NEGATIVE  MRSA PCR SCREENING     Status: Abnormal   Collection Time    03/22/13  5:37 PM      Result Value Range   MRSA by PCR POSITIVE (*) NEGATIVE  TROPONIN I     Status: None   Collection Time    03/22/13  7:15 PM      Result Value Range   Troponin I <0.30  <0.30 ng/mL  TROPONIN I     Status: None   Collection Time    03/23/13 12:40 AM      Result Value Range   Troponin I <0.30  <0.30 ng/mL  BASIC METABOLIC PANEL     Status: Abnormal   Collection Time    03/23/13 12:40 AM      Result Value Range   Sodium 137  135 - 145 mEq/L   Potassium 3.4 (*) 3.5 - 5.1 mEq/L   Chloride 104  96 - 112 mEq/L   CO2 23  19 - 32 mEq/L   Glucose, Bld 105 (*) 70 - 99 mg/dL   BUN 11  6 - 23 mg/dL   Creatinine, Ser 0.96  0.50 - 1.35 mg/dL    Calcium 8.5  8.4 - 04.5 mg/dL   GFR calc non Af Amer 83 (*) >90 mL/min   GFR calc Af Amer >90  >90 mL/min  CBC     Status: None   Collection Time    03/23/13 12:40 AM      Result Value Range   WBC 7.8  4.0 - 10.5 K/uL   RBC 4.97  4.22 - 5.81 MIL/uL   Hemoglobin 14.5  13.0 - 17.0 g/dL   HCT 40.9  81.1 - 91.4 %   MCV 82.7  78.0 - 100.0 fL   MCH 29.2  26.0 - 34.0 pg   MCHC 35.3  30.0 - 36.0 g/dL   RDW 78.2  95.6 - 21.3 %   Platelets 201  150 - 400 K/uL    Signed: Pleas Koch, MD 03/23/2013,  11:30 AM   Time Spent on Discharge: 70 minutes Services Ordered on Discharge: None Equipment Ordered on Discharge: None

## 2013-03-23 NOTE — ED Notes (Signed)
Pt to ED from [facility] from 2nd floor-- pt left 2600 AMA states "I feel the same as I did 30 hours ago and they haven't done anything for me. I still have a headache, I still have chest pain" Pt talking with eyes closed, no eye contact made. When assistance offered to help out of wheel chair- pt states "my left arm hurts-don't touch it!" while jerking right arm away. Kept eyes closed when getting into bed, states right arm is numb, able to move all extremeties, equal grips,

## 2013-03-23 NOTE — H&P (Signed)
INTERNAL MEDICINE TEACHING ATTENDING NOTE  Day 1 of stay   Patient name: [name]  Medical record number: [id number]   Date of birth: November 06, 1960    52 y.o. year old male with past medical history of MI 8 month ago presented with chest pain and also gave history of consuming alcohol, marijuana and cocaine. The patient was worked up overnight for acute myocardial infarction. His serial troponins were negative 5 times in a row. His initial EKG showed atypical J shaped ST elevation in V2-V6, which was present in his second EKG at 554 am, seemed to be resolving by the third EKG at 842 am. His 5th EKG at 1015 was completely normal. His initial EKG showed prolonged QTc which resolved as well on subsequent EKGs. The patient was complaining of non-specific right arm numbness for which neurology saw him and a CT head was unremarkable for acute changes pertaining to CVA. An MRI brain was recommended but the patient refused it. The patient was restless and required a total of about 11 mg of IV ativan throughout his stay, last injection was at 2314. In the morning, he was belligerent and wanted to leave. Dr. Glendell Docker discussed the risks of leaving against medical advice with him, but he did not agree to stay. I was notified by my team and I arrived on the scene when the patient had already signed his AMA discharge form. He seemed to be limping on his left foot at first, and then started doing the same on the left foot, while walking in the hallway. We followed him, and I was able to talk to him briefly as he sat in the waiting room near the entrance of the hospital. He was oriented to time place and person, and did not express any suicidal or homicidal ideation. It was clear that he could reason things well and was competent, however still elected to leave. He did not allow [place] to take exit vitals or examine him.    I have discussed this with my IM resident team.  Please see the rest of the plan per resident note  from today.   Jonus Coble 03/23/2013, 11:22 AM.

## 2013-03-23 NOTE — Progress Notes (Signed)
Pt refused MRI . Came back to the unit very agitated refused to lay down in bed. Called security to the room. Pt signed AMA . IV's D/C.pt took belongimg

## 2013-03-24 NOTE — ED Provider Notes (Signed)
Medical screening examination/treatment/procedure(s) were performed by non-physician practitioner and as supervising physician I was immediately available for consultation/collaboration.  EKG Interpretation   None         Junius Argyle, MD 03/24/13 1052

## 2013-03-26 NOTE — Discharge Summary (Signed)
The patient signed AMA from the hospital. He had been ruled out of AMI for his complaint of chest pain. However, he was urged to stay for some time so we could formulate a proper discharge which we were anticipating the same day anyhow. He refused. Please see my note from the morning of 03/22/13. I agree with the documentation in Dr. Burtis Junes note. The patient refused examination.

## 2013-03-26 NOTE — Consult Note (Signed)
This patient signed AMA the same morning, when he presented to the ED. We were planning to discharge him later in the same day anyhow. The patient walked with a limp on the left side, then sat down and talked to [place] and we explained to him that he should have stayed as we would have liked to arrange outpatient follow ups for him, however he refused that. He did not give [place] any time to arrange for an outpatient follow up. Instead, he stood up and started walking, this time limping on the right side. His behavior was bizzare. He presented himself to the ED soon after, and demanded an admission because he felt weak. However, his vitals were found to be stable. He refused examination to the medical team, however I reviewed the ED PA's note and his physical exam appeared to have no acute worrisome findings. Based on this, and the patient's non-cooperativeness while he was fully oriented, we decided that he did not need an immediate admission. He seemed anxious and emotional, however denied any suicidal or homicidal thoughts - thus not eligible for immediate inpatient psychiatry assessment or admission. He does need a psychiatry follow up, and a neurology outpatient follow up and a regular PCP. He also seems to need a good supportive home environment, however the patient did not allow [place] to talk to him about that. I agree with Dr. Shiela Mayer note, however I did not examine the patient when the patient presented himself to the ED.  Marland Kitchen

## 2014-03-23 ENCOUNTER — Emergency Department (HOSPITAL_COMMUNITY): Payer: PRIVATE HEALTH INSURANCE

## 2014-03-23 ENCOUNTER — Encounter (HOSPITAL_COMMUNITY): Payer: Self-pay | Admitting: Emergency Medicine

## 2014-03-23 ENCOUNTER — Observation Stay (HOSPITAL_COMMUNITY)
Admission: EM | Admit: 2014-03-23 | Discharge: 2014-03-26 | Disposition: A | Discharge status: Home / Self Care | Payer: PRIVATE HEALTH INSURANCE | Attending: Internal Medicine | Admitting: Internal Medicine

## 2014-03-23 DIAGNOSIS — F1410 Cocaine abuse, uncomplicated: Secondary | ICD-10-CM | POA: Diagnosis not present

## 2014-03-23 DIAGNOSIS — Z720 Tobacco use: Secondary | ICD-10-CM | POA: Diagnosis not present

## 2014-03-23 DIAGNOSIS — I10 Essential (primary) hypertension: Secondary | ICD-10-CM | POA: Diagnosis not present

## 2014-03-23 DIAGNOSIS — F329 Major depressive disorder, single episode, unspecified: Secondary | ICD-10-CM | POA: Diagnosis present

## 2014-03-23 DIAGNOSIS — R195 Other fecal abnormalities: Secondary | ICD-10-CM | POA: Diagnosis present

## 2014-03-23 DIAGNOSIS — F1010 Alcohol abuse, uncomplicated: Secondary | ICD-10-CM | POA: Diagnosis present

## 2014-03-23 DIAGNOSIS — R059 Cough, unspecified: Secondary | ICD-10-CM | POA: Diagnosis present

## 2014-03-23 DIAGNOSIS — M549 Dorsalgia, unspecified: Secondary | ICD-10-CM

## 2014-03-23 DIAGNOSIS — K219 Gastro-esophageal reflux disease without esophagitis: Secondary | ICD-10-CM | POA: Diagnosis present

## 2014-03-23 DIAGNOSIS — M25551 Pain in right hip: Secondary | ICD-10-CM | POA: Insufficient documentation

## 2014-03-23 DIAGNOSIS — R112 Nausea with vomiting, unspecified: Secondary | ICD-10-CM | POA: Diagnosis present

## 2014-03-23 DIAGNOSIS — R079 Chest pain, unspecified: Principal | ICD-10-CM | POA: Diagnosis present

## 2014-03-23 DIAGNOSIS — E559 Vitamin D deficiency, unspecified: Secondary | ICD-10-CM | POA: Insufficient documentation

## 2014-03-23 DIAGNOSIS — R0789 Other chest pain: Secondary | ICD-10-CM

## 2014-03-23 DIAGNOSIS — R61 Generalized hyperhidrosis: Secondary | ICD-10-CM | POA: Insufficient documentation

## 2014-03-23 DIAGNOSIS — M79671 Pain in right foot: Secondary | ICD-10-CM | POA: Diagnosis present

## 2014-03-23 DIAGNOSIS — R6883 Chills (without fever): Secondary | ICD-10-CM | POA: Insufficient documentation

## 2014-03-23 DIAGNOSIS — M109 Gout, unspecified: Secondary | ICD-10-CM | POA: Insufficient documentation

## 2014-03-23 DIAGNOSIS — M1990 Unspecified osteoarthritis, unspecified site: Secondary | ICD-10-CM | POA: Insufficient documentation

## 2014-03-23 DIAGNOSIS — G8929 Other chronic pain: Secondary | ICD-10-CM | POA: Diagnosis not present

## 2014-03-23 DIAGNOSIS — M791 Myalgia: Secondary | ICD-10-CM | POA: Diagnosis not present

## 2014-03-23 DIAGNOSIS — R202 Paresthesia of skin: Secondary | ICD-10-CM | POA: Diagnosis present

## 2014-03-23 DIAGNOSIS — M79672 Pain in left foot: Secondary | ICD-10-CM | POA: Diagnosis present

## 2014-03-23 DIAGNOSIS — R05 Cough: Secondary | ICD-10-CM | POA: Diagnosis not present

## 2014-03-23 DIAGNOSIS — Z8619 Personal history of other infectious and parasitic diseases: Secondary | ICD-10-CM | POA: Insufficient documentation

## 2014-03-23 DIAGNOSIS — R109 Unspecified abdominal pain: Secondary | ICD-10-CM | POA: Diagnosis present

## 2014-03-23 DIAGNOSIS — R1110 Vomiting, unspecified: Secondary | ICD-10-CM | POA: Diagnosis not present

## 2014-03-23 DIAGNOSIS — K649 Unspecified hemorrhoids: Secondary | ICD-10-CM | POA: Insufficient documentation

## 2014-03-23 DIAGNOSIS — F319 Bipolar disorder, unspecified: Secondary | ICD-10-CM | POA: Diagnosis not present

## 2014-03-23 DIAGNOSIS — R10A1 Flank pain, right side: Secondary | ICD-10-CM | POA: Diagnosis present

## 2014-03-23 DIAGNOSIS — R5383 Other fatigue: Secondary | ICD-10-CM | POA: Insufficient documentation

## 2014-03-23 DIAGNOSIS — B1920 Unspecified viral hepatitis C without hepatic coma: Secondary | ICD-10-CM | POA: Diagnosis present

## 2014-03-23 DIAGNOSIS — F1910 Other psychoactive substance abuse, uncomplicated: Secondary | ICD-10-CM | POA: Diagnosis present

## 2014-03-23 DIAGNOSIS — R200 Anesthesia of skin: Secondary | ICD-10-CM | POA: Diagnosis present

## 2014-03-23 DIAGNOSIS — E119 Type 2 diabetes mellitus without complications: Secondary | ICD-10-CM | POA: Diagnosis not present

## 2014-03-23 DIAGNOSIS — Z91199 Patient's noncompliance with other medical treatment and regimen due to unspecified reason: Secondary | ICD-10-CM

## 2014-03-23 DIAGNOSIS — R197 Diarrhea, unspecified: Secondary | ICD-10-CM | POA: Diagnosis present

## 2014-03-23 DIAGNOSIS — F1920 Other psychoactive substance dependence, uncomplicated: Secondary | ICD-10-CM | POA: Diagnosis present

## 2014-03-23 DIAGNOSIS — F419 Anxiety disorder, unspecified: Secondary | ICD-10-CM | POA: Diagnosis not present

## 2014-03-23 DIAGNOSIS — F32A Depression, unspecified: Secondary | ICD-10-CM | POA: Diagnosis present

## 2014-03-23 DIAGNOSIS — Z9119 Patient's noncompliance with other medical treatment and regimen: Secondary | ICD-10-CM

## 2014-03-23 LAB — I-STAT CHEM 8, ED
BUN: 7 mg/dL (ref 6–23)
Calcium, Ion: 1.13 mmol/L (ref 1.12–1.23)
Chloride: 105 mEq/L (ref 96–112)
Creatinine, Ser: 1.1 mg/dL (ref 0.50–1.35)
Glucose, Bld: 96 mg/dL (ref 70–99)
HCT: 49 % (ref 39.0–52.0)
Hemoglobin: 16.7 g/dL (ref 13.0–17.0)
Potassium: 3.3 mEq/L — ABNORMAL LOW (ref 3.7–5.3)
Sodium: 140 mEq/L (ref 137–147)
TCO2: 20 mmol/L (ref 0–100)

## 2014-03-23 LAB — CBC WITH DIFFERENTIAL/PLATELET
Basophils Absolute: 0 10*3/uL (ref 0.0–0.1)
Basophils Relative: 0 % (ref 0–1)
Eosinophils Absolute: 0.1 10*3/uL (ref 0.0–0.7)
Eosinophils Relative: 2 % (ref 0–5)
HCT: 44 % (ref 39.0–52.0)
Hemoglobin: 15.5 g/dL (ref 13.0–17.0)
Lymphocytes Relative: 22 % (ref 12–46)
Lymphs Abs: 1.5 10*3/uL (ref 0.7–4.0)
MCH: 28.3 pg (ref 26.0–34.0)
MCHC: 35.2 g/dL (ref 30.0–36.0)
MCV: 80.4 fL (ref 78.0–100.0)
Monocytes Absolute: 0.5 10*3/uL (ref 0.1–1.0)
Monocytes Relative: 7 % (ref 3–12)
Neutro Abs: 4.7 10*3/uL (ref 1.7–7.7)
Neutrophils Relative %: 69 % (ref 43–77)
Platelets: 233 10*3/uL (ref 150–400)
RBC: 5.47 MIL/uL (ref 4.22–5.81)
RDW: 14.2 % (ref 11.5–15.5)
WBC: 6.8 10*3/uL (ref 4.0–10.5)

## 2014-03-23 LAB — I-STAT CG4 LACTIC ACID, ED: Lactic Acid, Venous: 1.67 mmol/L (ref 0.5–2.2)

## 2014-03-23 LAB — MRSA PCR SCREENING: MRSA by PCR: NEGATIVE

## 2014-03-23 LAB — URINALYSIS, ROUTINE W REFLEX MICROSCOPIC
Bilirubin Urine: NEGATIVE
Glucose, UA: NEGATIVE mg/dL
Hgb urine dipstick: NEGATIVE
Ketones, ur: NEGATIVE mg/dL
Leukocytes, UA: NEGATIVE
Nitrite: NEGATIVE
Protein, ur: NEGATIVE mg/dL
Specific Gravity, Urine: 1.038 — ABNORMAL HIGH (ref 1.005–1.030)
Urobilinogen, UA: 1 mg/dL (ref 0.0–1.0)
pH: 7 (ref 5.0–8.0)

## 2014-03-23 LAB — COMPREHENSIVE METABOLIC PANEL
ALT: 373 U/L — ABNORMAL HIGH (ref 0–53)
AST: 185 U/L — ABNORMAL HIGH (ref 0–37)
Albumin: 4.1 g/dL (ref 3.5–5.2)
Alkaline Phosphatase: 58 U/L (ref 39–117)
Anion gap: 15 (ref 5–15)
BUN: 8 mg/dL (ref 6–23)
CO2: 21 mEq/L (ref 19–32)
Calcium: 9.6 mg/dL (ref 8.4–10.5)
Chloride: 103 mEq/L (ref 96–112)
Creatinine, Ser: 0.99 mg/dL (ref 0.50–1.35)
GFR calc Af Amer: >90 mL/min (ref >90)
GFR calc non Af Amer: >90 mL/min (ref >90)
Glucose, Bld: 94 mg/dL (ref 70–99)
Potassium: 3.5 mEq/L — ABNORMAL LOW (ref 3.7–5.3)
Sodium: 139 mEq/L (ref 137–147)
Total Bilirubin: 0.6 mg/dL (ref 0.3–1.2)
Total Protein: 7.9 g/dL (ref 6.0–8.3)

## 2014-03-23 LAB — HEPATITIS PANEL, ACUTE
HCV Ab: REACTIVE — AB
Hep A IgM: NONREACTIVE
Hep B C IgM: NONREACTIVE
Hepatitis B Surface Ag: NEGATIVE

## 2014-03-23 LAB — LIPID PANEL
Cholesterol: 154 mg/dL (ref 0–200)
HDL: 32 mg/dL — ABNORMAL LOW (ref >39)
LDL Cholesterol: 97 mg/dL (ref 0–99)
Total CHOL/HDL Ratio: 4.8 RATIO
Triglycerides: 126 mg/dL (ref <150)
VLDL: 25 mg/dL (ref 0–40)

## 2014-03-23 LAB — RAPID URINE DRUG SCREEN, HOSP PERFORMED
Amphetamines: NOT DETECTED
Barbiturates: NOT DETECTED
Benzodiazepines: NOT DETECTED
Cocaine: POSITIVE — AB
Opiates: NOT DETECTED
Tetrahydrocannabinol: POSITIVE — AB

## 2014-03-23 LAB — MAGNESIUM: Magnesium: 1.9 mg/dL (ref 1.5–2.5)

## 2014-03-23 LAB — LIPASE, BLOOD: Lipase: 39 U/L (ref 11–59)

## 2014-03-23 LAB — I-STAT TROPONIN, ED: Troponin i, poc: 0 ng/mL (ref 0.00–0.08)

## 2014-03-23 LAB — TROPONIN I
Troponin I: <0.3 ng/mL (ref <0.30)
Troponin I: <0.3 ng/mL (ref <0.30)

## 2014-03-23 LAB — INFLUENZA PANEL BY PCR (TYPE A & B)
H1N1 flu by pcr: NOT DETECTED
Influenza A By PCR: NEGATIVE
Influenza B By PCR: NEGATIVE

## 2014-03-23 LAB — POC OCCULT BLOOD, ED: Fecal Occult Bld: POSITIVE — AB

## 2014-03-23 LAB — ETHANOL: Alcohol, Ethyl (B): <11 mg/dL (ref 0–11)

## 2014-03-23 MED ORDER — NITROGLYCERIN 0.4 MG SL SUBL
0.4000 mg | SUBLINGUAL_TABLET | Freq: Once | SUBLINGUAL | Status: AC
  Administered 2014-03-23: 0.4 mg via SUBLINGUAL
  Filled 2014-03-23: qty 1

## 2014-03-23 MED ORDER — ONDANSETRON HCL 4 MG/2ML IJ SOLN
4.0000 mg | Freq: Once | INTRAMUSCULAR | Status: AC
  Administered 2014-03-23: 4 mg via INTRAVENOUS
  Filled 2014-03-23: qty 2

## 2014-03-23 MED ORDER — SODIUM CHLORIDE 0.9 % IJ SOLN
3.0000 mL | Freq: Two times a day (BID) | INTRAMUSCULAR | Status: DC
  Administered 2014-03-23 – 2014-03-25 (×3): 3 mL via INTRAVENOUS

## 2014-03-23 MED ORDER — ENOXAPARIN SODIUM 40 MG/0.4ML ~~LOC~~ SOLN
40.0000 mg | SUBCUTANEOUS | Status: DC
  Administered 2014-03-23: 40 mg via SUBCUTANEOUS
  Filled 2014-03-23: qty 0.4

## 2014-03-23 MED ORDER — MORPHINE SULFATE 4 MG/ML IJ SOLN
4.0000 mg | Freq: Once | INTRAMUSCULAR | Status: AC
  Administered 2014-03-23: 4 mg via INTRAVENOUS
  Filled 2014-03-23: qty 1

## 2014-03-23 MED ORDER — IOHEXOL 350 MG/ML SOLN
100.0000 mL | Freq: Once | INTRAVENOUS | Status: AC | PRN
  Administered 2014-03-23: 100 mL via INTRAVENOUS

## 2014-03-23 MED ORDER — LORAZEPAM 2 MG/ML IJ SOLN
1.0000 mg | Freq: Once | INTRAMUSCULAR | Status: AC
  Administered 2014-03-23: 1 mg via INTRAVENOUS
  Filled 2014-03-23: qty 1

## 2014-03-23 MED ORDER — GI COCKTAIL ~~LOC~~
30.0000 mL | Freq: Once | ORAL | Status: AC
  Administered 2014-03-23: 30 mL via ORAL
  Filled 2014-03-23: qty 30

## 2014-03-23 MED ORDER — NITROGLYCERIN 0.4 MG SL SUBL
0.4000 mg | SUBLINGUAL_TABLET | SUBLINGUAL | Status: DC | PRN
  Administered 2014-03-23: 0.4 mg via SUBLINGUAL
  Filled 2014-03-23: qty 1

## 2014-03-23 MED ORDER — ONDANSETRON HCL 4 MG/2ML IJ SOLN
4.0000 mg | Freq: Four times a day (QID) | INTRAMUSCULAR | Status: DC | PRN
  Administered 2014-03-23: 4 mg via INTRAVENOUS
  Filled 2014-03-23: qty 2

## 2014-03-23 MED ORDER — PANTOPRAZOLE SODIUM 40 MG PO TBEC
40.0000 mg | DELAYED_RELEASE_TABLET | Freq: Every day | ORAL | Status: DC
  Administered 2014-03-23 – 2014-03-26 (×4): 40 mg via ORAL
  Filled 2014-03-23 (×2): qty 1

## 2014-03-23 MED ORDER — DM-GUAIFENESIN ER 30-600 MG PO TB12: 1.0000 | ORAL_TABLET | Freq: Two times a day (BID) | ORAL | Status: DC | PRN

## 2014-03-23 NOTE — ED Provider Notes (Signed)
CSN: [id number]     Arrival date & time 03/23/14  6606 History   First MD Initiated Contact with Patient 03/23/14 0932     Chief Complaint  Patient presents with  . Chest Pain  . Emesis  . Diarrhea  . Chills     (Consider location/radiation/quality/duration/timing/severity/associated sxs/prior Treatment) HPI Comments: Patient from home with 3 day history of nausea, vomiting, diarrhea. He also complains of central chest pain that has been constant for the past 3 days and does not radiate. He admits to using cocaine 3 days ago with chest pain started prior to that. Pain is in the center of his chest. He also has pain in his right flank that is constant and worse with movement. He reports cough productive of green mucus as well as brown diarrhea which became red and now having blood in the stools. Reports history of hypertension, bipolar, alcohol and cocaine abuse. Not taking any medications currently. Denies any previous stent placement. Reports that MI in [name] last year. Has had sick contacts at home and exposure to flu. Also reports diffuse numbness and right arm and onset this morning. No weakness.  The history is provided by the patient and the EMS personnel. The history is limited by the condition of the patient.    Past Medical History  Diagnosis Date  . Arthritis     per pt rheumatoid   . Hypertension   . Anxiety   . Bipolar affective disorder   . Depression   . Alcohol abuse   . Cocaine abuse     smokes crack-cocaine  . GERD (gastroesophageal reflux disease)   . Chronic back pain   . Tobacco abuse   . Hepatitis C   . Vitamin D deficiency   . Gout   . Hemorrhoids   . Diabetes mellitus without complication    History reviewed. No pertinent past surgical history. Family History  Problem Relation Age of Onset  . Hypertension Brother   . Diabetes Brother   . Hypertension Mother   . Diabetes Mother   . Arthritis Mother   . Other Sister     bone disease   History   Substance Use Topics  . Smoking status: Current Every Day Smoker -- 0.25 packs/day    Types: Cigarettes    Last Attempt to Quit: 11/23/2011  . Smokeless tobacco: Not on file  . Alcohol Use: Yes     Comment: 24 oz--3x's wkly     Review of Systems  Constitutional: Positive for activity change, appetite change and fatigue. Negative for fever.  HENT: Negative for congestion and rhinorrhea.   Eyes: Negative for visual disturbance.  Respiratory: Positive for cough, chest tightness and shortness of breath.   Cardiovascular: Positive for chest pain.  Gastrointestinal: Positive for nausea, vomiting, abdominal pain, diarrhea, blood in stool and anal bleeding.  Genitourinary: Negative for dysuria and hematuria.  Musculoskeletal: Positive for myalgias and arthralgias. Negative for neck pain and neck stiffness.  Skin: Negative for wound.  Neurological: Positive for numbness. Negative for dizziness and headaches.  A complete 10 system review of systems was obtained and all systems are negative except as noted in the HPI and PMH.      Allergies  Review of patient's allergies indicates no known allergies.  Home Medications   Prior to Admission medications   Not on File   BP 137/97 mmHg  Pulse 93  Temp(Src) 98.2 F (36.8 C) (Oral)  Resp 22  Ht 5\' 7"  (1.702 m)  Wt 257 lb 4.4 oz (116.7 kg)  BMI 40.29 kg/m2  SpO2 99% Physical Exam  Constitutional: He is oriented to person, place, and time. He appears well-developed and well-nourished. He appears distressed.  uncomfortable  HENT:  Head: Normocephalic and atraumatic.  Mouth/Throat: Oropharynx is clear and moist. No oropharyngeal exudate.  Eyes: Conjunctivae and EOM are normal. Pupils are equal, round, and reactive to light.  Neck: Normal range of motion. Neck supple.  No meningismus.  Cardiovascular: Normal rate, regular rhythm, normal heart sounds and intact distal pulses.   No murmur heard. Pulmonary/Chest: Effort normal and breath  sounds normal. No respiratory distress. He exhibits tenderness.  Central chest wall tenderness  Abdominal: Soft. There is no tenderness. There is no rebound and no guarding.  Musculoskeletal: Normal range of motion. He exhibits tenderness. He exhibits no edema.  R CVAT  Neurological: He is alert and oriented to person, place, and time. No cranial nerve deficit. He exhibits normal muscle tone. Coordination normal.  No ataxia on finger to nose bilaterally. No pronator drift. 5/5 strength throughout. CN 2-12 intact. Negative Romberg. Equal grip strength. Sensation intact. Gait is normal.   Skin: Skin is warm. He is diaphoretic.  Psychiatric: He has a normal mood and affect. His behavior is normal.  Nursing note and vitals reviewed.   ED Course  Procedures (including critical care time) Labs Review Labs Reviewed  COMPREHENSIVE METABOLIC PANEL - Abnormal; Notable for the following:    Potassium 3.5 (*)    AST 185 (*)    ALT 373 (*)    All other components within normal limits  URINE RAPID DRUG SCREEN (HOSP PERFORMED) - Abnormal; Notable for the following:    Cocaine POSITIVE (*)    Tetrahydrocannabinol POSITIVE (*)    All other components within normal limits  URINALYSIS, ROUTINE W REFLEX MICROSCOPIC - Abnormal; Notable for the following:    Specific Gravity, Urine 1.038 (*)    All other components within normal limits  LIPID PANEL - Abnormal; Notable for the following:    HDL 32 (*)    All other components within normal limits  I-STAT CHEM 8, ED - Abnormal; Notable for the following:    Potassium 3.3 (*)    All other components within normal limits  POC OCCULT BLOOD, ED - Abnormal; Notable for the following:    Fecal Occult Bld POSITIVE (*)    All other components within normal limits  MRSA PCR SCREENING  CLOSTRIDIUM DIFFICILE BY PCR  CBC WITH DIFFERENTIAL  LIPASE, BLOOD  ETHANOL  TROPONIN I  MAGNESIUM  INFLUENZA PANEL BY PCR (TYPE A & B, H1N1)  TROPONIN I  TROPONIN I   HEPATITIS PANEL, ACUTE  HCV RNA QUANT RFLX ULTRA OR GENOTYP  HIV ANTIBODY (ROUTINE TESTING)  COMPREHENSIVE METABOLIC PANEL  CBC WITH DIFFERENTIAL  HEMOGLOBIN A1C  I-STAT CG4 LACTIC ACID, ED  I-STAT TROPOININ, ED    Imaging Review Ct Head Wo Contrast  03/23/2014   CLINICAL DATA:  Initial encounter for 3 a history of nausea with vomiting and diarrhea +chills.  EXAM: CT HEAD WITHOUT CONTRAST  TECHNIQUE: Contiguous axial images were obtained from the base of the skull through the vertex without intravenous contrast.  COMPARISON:  None.  FINDINGS: There is no evidence for acute hemorrhage, hydrocephalus, mass lesion, or abnormal extra-axial fluid collection. No definite CT evidence for acute infarction. Diffuse loss of parenchymal volume is consistent with atrophy. The visualized paranasal sinuses and mastoid air cells are clear.  IMPRESSION: Very mild diffuse  atrophy. Otherwise normal exam which is unchanged in the interval.   Electronically Signed   By: Kennith Center M.D.   On: 03/23/2014 12:42   Dg Chest Portable 1 View  03/23/2014   CLINICAL DATA:  Chest pain, cough, shortness of breath  EXAM: PORTABLE CHEST - 1 VIEW  COMPARISON:  03/22/2013  FINDINGS: Low lung volumes with vascular crowding. Mild bibasilar opacities, likely atelectasis. No pleural effusion or pneumothorax.  Cardiomegaly.  IMPRESSION: Low lung volumes with vascular crowding.  Mild bibasilar opacities, likely atelectasis.   Electronically Signed   By: Charline Bills M.D.   On: 03/23/2014 10:10   Ct Angio Chest Aorta W/cm &/or Wo/cm  03/23/2014   CLINICAL DATA:  Initial encounter for substernal chest pain radiates to the back. Pain began 2 days ago.  EXAM: CT ANGIOGRAPHY CHEST, ABDOMEN AND PELVIS  TECHNIQUE: Multidetector CT imaging through the chest, abdomen and pelvis was performed using the standard protocol during bolus administration of intravenous contrast. Multiplanar reconstructed images and MIPs were obtained and  reviewed to evaluate the vascular anatomy.  CONTRAST:  OMNIPAQUE IOHEXOL 350 MG/ML SOLN  COMPARISON:  CT abdomen and pelvis from 07/13/2012. CT chest from 03/22/2013.  FINDINGS: CTA CHEST FINDINGS  Soft tissue / Mediastinum: Pre contrast imaging shows no hyperdense crescent in the wall of the thoracic aorta to suggest acute intramural hematoma. Imaging after IV contrast administration shows no evidence for dissection the thoracic aorta. No evidence for penetrating ulcer or or ulcerated plaque. Bovine arch vessel anatomy is evident with major arch vessels appearing widely patent.  There is no axillary lymphadenopathy. No mediastinal or hilar lymphadenopathy. Heart size is normal. No pericardial effusion. Thoracic esophagus is unremarkable.  Lungs / Pleura: Lung volumes are low and fine architectural detail is obscured by breathing motion during image acquisition. Ground-glass attenuation the dependent lower lobes and lung bases is probably secondary to compressive atelectasis. No dense focal airspace consolidation. No evidence for pulmonary parenchymal nodule or mass.  Bones: Bone windows reveal no worrisome lytic or sclerotic osseous lesions.  Review of the MIP images confirms the above findings.  CTA ABDOMEN AND PELVIS FINDINGS  Hepatobiliary: Insert normal Liver There is no evidence for gallstones, gallbladder wall thickening, or pericholecystic fluid. No intrahepatic or extrahepatic biliary dilation.  Pancreas: No focal mass lesion. No dilatation of the main duct. No intraparenchymal cyst. No peripancreatic edema.  Spleen: No splenomegaly. No focal mass lesion.  Adrenals/Urinary Tract: The right adrenal gland is normal. A tiny calcification is seen in the left adrenal gland without a nodule or mass. This could be related to prior inflammation or hemorrhage. Small cortical cysts are noted in both kidneys. The ureters are normal bilaterally. There is some early excreted contrast visible in the bladder lumen.   Stomach/Bowel: Stomach is nondistended. No gastric wall thickening. No evidence of outlet obstruction. Duodenum is normally positioned as is the ligament of Treitz. No small bowel wall thickening. No small bowel dilatation. Terminal ileum is normal. The appendix is normal. Diverticular changes seen in the left colon without diverticulitis.  Vascular/Lymphatic: There is no abdominal aortic aneurysm. There is no dissection of the abdominal aorta. Mild atherosclerotic calcification/ plaque is seen in the infrarenal aorta. The celiac axis is widely patent with normal branching configuration. The superior mesenteric artery is widely patent. Inferior mesenteric artery is widely patent. A single right renal artery is widely patent. Main left renal artery is widely patent and there is an accessory left renal artery supplying the posterior  left upper renal pole.  No gastrohepatic lymphadenopathy. Upper normal size lymph nodes are seen in the hepatoduodenal ligament. There is no retroperitoneal lymphadenopathy. No pelvic sidewall lymphadenopathy.  Reproductive: Prostate gland and seminal vesicles are normal by CT imaging.  Other: No intraperitoneal free fluid.  Musculoskeletal: Bone windows reveal no worrisome lytic or sclerotic osseous lesions. Advanced degenerative disc disease is seen at L4-5.  Review of the MIP images confirms the above findings.  IMPRESSION: 1. No thoracoabdominal aortic aneurysm. No evidence for acute intramural hematoma within the thoracic aorta. No dissection of the thoracoabdominal aorta. 2. No acute findings in the chest. Specifically, no findings to explain the patient's history of substernal chest pain. 3. No acute findings in the abdomen or pelvis.   Electronically Signed   By: Kennith Center M.D.   On: 03/23/2014 12:54   Ct Cta Abd/pel W/cm &/or W/o Cm  03/23/2014   CLINICAL DATA:  Initial encounter for substernal chest pain radiates to the back. Pain began 2 days ago.  EXAM: CT ANGIOGRAPHY  CHEST, ABDOMEN AND PELVIS  TECHNIQUE: Multidetector CT imaging through the chest, abdomen and pelvis was performed using the standard protocol during bolus administration of intravenous contrast. Multiplanar reconstructed images and MIPs were obtained and reviewed to evaluate the vascular anatomy.  CONTRAST:  OMNIPAQUE IOHEXOL 350 MG/ML SOLN  COMPARISON:  CT abdomen and pelvis from 07/13/2012. CT chest from 03/22/2013.  FINDINGS: CTA CHEST FINDINGS  Soft tissue / Mediastinum: Pre contrast imaging shows no hyperdense crescent in the wall of the thoracic aorta to suggest acute intramural hematoma. Imaging after IV contrast administration shows no evidence for dissection the thoracic aorta. No evidence for penetrating ulcer or or ulcerated plaque. Bovine arch vessel anatomy is evident with major arch vessels appearing widely patent.  There is no axillary lymphadenopathy. No mediastinal or hilar lymphadenopathy. Heart size is normal. No pericardial effusion. Thoracic esophagus is unremarkable.  Lungs / Pleura: Lung volumes are low and fine architectural detail is obscured by breathing motion during image acquisition. Ground-glass attenuation the dependent lower lobes and lung bases is probably secondary to compressive atelectasis. No dense focal airspace consolidation. No evidence for pulmonary parenchymal nodule or mass.  Bones: Bone windows reveal no worrisome lytic or sclerotic osseous lesions.  Review of the MIP images confirms the above findings.  CTA ABDOMEN AND PELVIS FINDINGS  Hepatobiliary: Insert normal Liver There is no evidence for gallstones, gallbladder wall thickening, or pericholecystic fluid. No intrahepatic or extrahepatic biliary dilation.  Pancreas: No focal mass lesion. No dilatation of the main duct. No intraparenchymal cyst. No peripancreatic edema.  Spleen: No splenomegaly. No focal mass lesion.  Adrenals/Urinary Tract: The right adrenal gland is normal. A tiny calcification is seen in the  left adrenal gland without a nodule or mass. This could be related to prior inflammation or hemorrhage. Small cortical cysts are noted in both kidneys. The ureters are normal bilaterally. There is some early excreted contrast visible in the bladder lumen.  Stomach/Bowel: Stomach is nondistended. No gastric wall thickening. No evidence of outlet obstruction. Duodenum is normally positioned as is the ligament of Treitz. No small bowel wall thickening. No small bowel dilatation. Terminal ileum is normal. The appendix is normal. Diverticular changes seen in the left colon without diverticulitis.  Vascular/Lymphatic: There is no abdominal aortic aneurysm. There is no dissection of the abdominal aorta. Mild atherosclerotic calcification/ plaque is seen in the infrarenal aorta. The celiac axis is widely patent with normal branching configuration. The superior mesenteric  artery is widely patent. Inferior mesenteric artery is widely patent. A single right renal artery is widely patent. Main left renal artery is widely patent and there is an accessory left renal artery supplying the posterior left upper renal pole.  No gastrohepatic lymphadenopathy. Upper normal size lymph nodes are seen in the hepatoduodenal ligament. There is no retroperitoneal lymphadenopathy. No pelvic sidewall lymphadenopathy.  Reproductive: Prostate gland and seminal vesicles are normal by CT imaging.  Other: No intraperitoneal free fluid.  Musculoskeletal: Bone windows reveal no worrisome lytic or sclerotic osseous lesions. Advanced degenerative disc disease is seen at L4-5.  Review of the MIP images confirms the above findings.  IMPRESSION: 1. No thoracoabdominal aortic aneurysm. No evidence for acute intramural hematoma within the thoracic aorta. No dissection of the thoracoabdominal aorta. 2. No acute findings in the chest. Specifically, no findings to explain the patient's history of substernal chest pain. 3. No acute findings in the abdomen or  pelvis.   Electronically Signed   By: Kennith Center M.D.   On: 03/23/2014 12:54     EKG Interpretation   Date/Time:  Monday March 23 2014 09:48:31 EST Ventricular Rate:  70 PR Interval:  194 QRS Duration: 88 QT Interval:  426 QTC Calculation: 460 R Axis:   65 Text Interpretation:  Sinus rhythm Abnormal R-wave progression, early  transition No significant change was found Confirmed by Manus Gunning  MD,  Athea Haley [id number]) on 03/23/2014 9:54:02 AM      MDM   Final diagnoses:  Chest pain  Flank pain  Cocaine abuse   Patient from home with multiple symptoms including chest pain, nausea, vomiting, diarrhea, rectal bleeding, flank pain, right arm numbness. Reports using cocaine 3 days ago. Chest pain is constant. EKG with subtle ST elevation in inferior leads similar to previous.  EKG d/w Dr. Katrinka Blazing, he agrees does not meet STEMI criteria.  Patient has been in pain x 3 days. ASA, NTG, zofran, morphine, ativan. CXR negative.  Troponin negative. Cocaine positive.  Insetting of cocaine abuse, chest pain, flank pain.  CTA obtained to evaluate for  Dissection.  No dissection seen d/w Dr. Molli Posey. LFTs elevated likely from hx of hepatitis.   CT head obtained given RUE numbness.  LSN yesterday.   Patient also endorses worsening depression but denies SI at this time.  Ongoing chest pain in setting of cocaine abuse.  Rule out admission d/w [facility] residents.   Glynn Octave, MD 03/23/14 [phone]

## 2014-03-23 NOTE — Progress Notes (Signed)
Attempted report.  Nurse stated pt was having CP and was going to try to relieve it with Nitro.

## 2014-03-23 NOTE — H&P (Signed)
Date: 03/23/2014               Patient Name:  [name] MRN: [id number]  DOB: October 10, 1960 Age / Sex: 53 y.o., male   PCP: Provider Default, MD         Medical Service: Internal Medicine Teaching Service         Attending Physician: Dr. Farley Ly, MD    First Contact: Dr. Leatha Gilding Pager: [phone]  Second Contact: Dr. Delane Ginger Pager: [phone]       After Hours (After 5p/  First Contact Pager: [phone]  weekends / holidays): Second Contact Pager: [phone]   Chief Complaint: flu-like symptoms and chest pain for 3 days   History of Present Illness:  [name] is a 53 yo man with a history of hypertension, generalized anxiety disorder, bipolar disorder, polysubstance abuse, rheumatoid arthritis and hepatitis C who presented with a variety of URI, GI and neurologic symptoms along with significant depression. He states that he has been experiencing chills, productive cough, nausea, vomiting, diarrhea, blood on the toilet paper after BMs and 8/10, right lower back pain and sharp chest pain when belching for the past 3 days. He had an episode of tingling in the fingers of his right hand over this same time period that resolved with better sleep. This was not associated with any weakness or changes in sensation. He has a sick contact in a neighbor who he spent some time with last week. He also admits to depressed mood, isolation near the holidays, feelings of guilt, difficulty sleeping, decreased appetite and general thoughts that he may want to hurt himself (thoug he has no plan). He would like to speak with a psychiatrist during this admission.  He receives his medical care at the [facility] [name] but has not been taking his medications for "many months". These medications were abilify, some blood pressure medications, prilosec and a medication for rheumatoid arthritis.   Of note, he was admitted at the same time (just before the holidays) last year with chest pain; he left AMA  the day after admission.  Patient given aspirin 324, nitro x2, ativan x 2 mg total, zofran 4mg  and morphine 4 mg in the ED with no relief and was admitted to the IMTS for chest pain rule-out.  Meds: Current Facility-Administered Medications  Medication Dose Route Frequency Provider Last Rate Last Dose  . dextromethorphan-guaiFENesin (MUCINEX DM) 30-600 MG per 12 hr tablet 1 tablet  1 tablet Oral BID PRN , MD      . ondansetron [facility]) injection 4 mg  4 mg Intravenous Q6H PRN [facility], MD      . pantoprazole (PROTONIX) EC tablet 40 mg  40 mg Oral Daily Annett Gula, MD   40 mg at 03/23/14 1751  . sodium chloride 0.9 % injection 3 mL  3 mL Intravenous Q12H 03/25/14, MD        Allergies: Allergies as of 03/23/2014  . (No Known Allergies)   Past Medical History  Diagnosis Date  . Arthritis     per pt rheumatoid   . Hypertension   . Anxiety   . Bipolar affective disorder   . Depression   . Alcohol abuse   . Cocaine abuse     smokes crack-cocaine  . GERD (gastroesophageal reflux disease)   . Chronic back pain   . Tobacco abuse   . Hepatitis C   . Vitamin D deficiency   .  Gout   . Hemorrhoids   . Diabetes mellitus without complication    History reviewed. No pertinent past surgical history. Family History  Problem Relation Age of Onset  . Hypertension Brother   . Diabetes Brother   . Hypertension Mother   . Diabetes Mother   . Arthritis Mother   . Other Sister     bone disease   History   Social History  . Marital Status: Single    Spouse Name: N/A    Number of Children: N/A  . Years of Education: N/A   Occupational History  . Not on file.   Social History Main Topics  . Smoking status: Current Every Day Smoker -- 0.25 packs/day    Types: Cigarettes    Last Attempt to Quit: 11/23/2011  . Smokeless tobacco: Not on file  . Alcohol Use: Yes     Comment: 24 oz--3x's wkly   . Drug Use: Yes    Special: Marijuana, Cocaine  . Sexual  Activity: Not Currently   Other Topics Concern  . Not on file   Social History Narrative   Lives alone in [place], [place]    Review of Systems: General: has felt sick for 3 days, +chills, +light-headedness Skin: no rashes or lesions HEENT: no headaches Cardiac: +chest pain while belching, history of GERD Respiratory: + productive cough GI: +nausea, vomiting, diarrhea, abdominal pain, blood on toilet paper after BMs Urinary: occasional stinging while urinating Msk: +right back pain while walking Psychiatric: +depressed mood  Physical Exam: Blood pressure 137/97, pulse 93, temperature 98.2 F (36.8 C), temperature source Oral, resp. rate 22, height 5\' 7"  (1.702 m), weight 257 lb 4.4 oz (116.7 kg), SpO2 99 %. Appearance: in NAD, lying in bed in ED watching TV HEENT: AT/[place], PERRL, EOMi, no lymphadenopathy Heart: RRR, normal S1S2, tenderness to substernal palpation Lungs: CTAB, no wheezes Abdomen: BS+, soft, nontender, no organomegaly, no visible external hemmorroids  Msk: tenderness to deep palpation right lower back Extremities: no edema b/l  Neurologic: A&Ox3, strength and sensation intact throughout, coordination intact Psychiatric: appears anxious, states that he has depressed mood, active SI without a plan, no HI, but worried about being home alone, occasionally teary   Lab results: Basic Metabolic Panel:  Recent Labs  0943 03/23/14 0957 03/23/14 1532  NA 139 140  --   K 3.5* 3.3*  --   CL 103 105  --   CO2 21  --   --   GLUCOSE 94 96  --   BUN 8 7  --   CREATININE 0.99 1.10  --   CALCIUM 9.6  --   --   MG  --   --  1.9   Liver Function Tests:  Recent Labs  03/23/14 0943  AST 185*  ALT 373*  ALKPHOS 58  BILITOT 0.6  PROT 7.9  ALBUMIN 4.1    Recent Labs  03/23/14 0943  LIPASE 39   CBC:  Recent Labs  03/23/14 0943 03/23/14 0957  WBC 6.8  --   NEUTROABS 4.7  --   HGB 15.5 16.7  HCT 44.0 49.0  MCV 80.4  --   PLT 233  --     Cardiac Enzymes:  Recent Labs  03/23/14 1335  TROPONINI <0.30   Urine Drug Screen: Drugs of Abuse     Component Value Date/Time   LABOPIA NONE DETECTED 03/23/2014 1245   COCAINSCRNUR POSITIVE* 03/23/2014 1245   LABBENZ NONE DETECTED 03/23/2014 1245   AMPHETMU NONE DETECTED 03/23/2014  1245   THCU POSITIVE* 03/23/2014 1245   LABBARB NONE DETECTED 03/23/2014 1245    Alcohol Level:  Recent Labs  03/23/14 0943  ETH <11   Urinalysis:  Recent Labs  03/23/14 1245  COLORURINE YELLOW  LABSPEC 1.038*  PHURINE 7.0  GLUCOSEU NEGATIVE  HGBUR NEGATIVE  BILIRUBINUR NEGATIVE  KETONESUR NEGATIVE  PROTEINUR NEGATIVE  UROBILINOGEN 1.0  NITRITE NEGATIVE  LEUKOCYTESUR NEGATIVE   Imaging results:  Ct Head Wo Contrast  03/23/2014   CLINICAL DATA:  Initial encounter for 3 a history of nausea with vomiting and diarrhea +chills.  EXAM: CT HEAD WITHOUT CONTRAST  TECHNIQUE: Contiguous axial images were obtained from the base of the skull through the vertex without intravenous contrast.  COMPARISON:  None.  FINDINGS: There is no evidence for acute hemorrhage, hydrocephalus, mass lesion, or abnormal extra-axial fluid collection. No definite CT evidence for acute infarction. Diffuse loss of parenchymal volume is consistent with atrophy. The visualized paranasal sinuses and mastoid air cells are clear.  IMPRESSION: Very mild diffuse atrophy. Otherwise normal exam which is unchanged in the interval.   Electronically Signed   By: Kennith Center M.D.   On: 03/23/2014 12:42   Dg Chest Portable 1 View  03/23/2014   CLINICAL DATA:  Chest pain, cough, shortness of breath  EXAM: PORTABLE CHEST - 1 VIEW  COMPARISON:  03/22/2013  FINDINGS: Low lung volumes with vascular crowding. Mild bibasilar opacities, likely atelectasis. No pleural effusion or pneumothorax.  Cardiomegaly.  IMPRESSION: Low lung volumes with vascular crowding.  Mild bibasilar opacities, likely atelectasis.   Electronically Signed   By:  Charline Bills M.D.   On: 03/23/2014 10:10   Ct Angio Chest Aorta W/cm &/or Wo/cm  03/23/2014   CLINICAL DATA:  Initial encounter for substernal chest pain radiates to the back. Pain began 2 days ago.  EXAM: CT ANGIOGRAPHY CHEST, ABDOMEN AND PELVIS  TECHNIQUE: Multidetector CT imaging through the chest, abdomen and pelvis was performed using the standard protocol during bolus administration of intravenous contrast. Multiplanar reconstructed images and MIPs were obtained and reviewed to evaluate the vascular anatomy.  CONTRAST:  OMNIPAQUE IOHEXOL 350 MG/ML SOLN  COMPARISON:  CT abdomen and pelvis from 07/13/2012. CT chest from 03/22/2013.  FINDINGS: CTA CHEST FINDINGS  Soft tissue / Mediastinum: Pre contrast imaging shows no hyperdense crescent in the wall of the thoracic aorta to suggest acute intramural hematoma. Imaging after IV contrast administration shows no evidence for dissection the thoracic aorta. No evidence for penetrating ulcer or or ulcerated plaque. Bovine arch vessel anatomy is evident with major arch vessels appearing widely patent.  There is no axillary lymphadenopathy. No mediastinal or hilar lymphadenopathy. Heart size is normal. No pericardial effusion. Thoracic esophagus is unremarkable.  Lungs / Pleura: Lung volumes are low and fine architectural detail is obscured by breathing motion during image acquisition. Ground-glass attenuation the dependent lower lobes and lung bases is probably secondary to compressive atelectasis. No dense focal airspace consolidation. No evidence for pulmonary parenchymal nodule or mass.  Bones: Bone windows reveal no worrisome lytic or sclerotic osseous lesions.  Review of the MIP images confirms the above findings.  CTA ABDOMEN AND PELVIS FINDINGS  Hepatobiliary: Insert normal Liver There is no evidence for gallstones, gallbladder wall thickening, or pericholecystic fluid. No intrahepatic or extrahepatic biliary dilation.  Pancreas: No focal mass  lesion. No dilatation of the main duct. No intraparenchymal cyst. No peripancreatic edema.  Spleen: No splenomegaly. No focal mass lesion.  Adrenals/Urinary Tract: The right adrenal  gland is normal. A tiny calcification is seen in the left adrenal gland without a nodule or mass. This could be related to prior inflammation or hemorrhage. Small cortical cysts are noted in both kidneys. The ureters are normal bilaterally. There is some early excreted contrast visible in the bladder lumen.  Stomach/Bowel: Stomach is nondistended. No gastric wall thickening. No evidence of outlet obstruction. Duodenum is normally positioned as is the ligament of Treitz. No small bowel wall thickening. No small bowel dilatation. Terminal ileum is normal. The appendix is normal. Diverticular changes seen in the left colon without diverticulitis.  Vascular/Lymphatic: There is no abdominal aortic aneurysm. There is no dissection of the abdominal aorta. Mild atherosclerotic calcification/ plaque is seen in the infrarenal aorta. The celiac axis is widely patent with normal branching configuration. The superior mesenteric artery is widely patent. Inferior mesenteric artery is widely patent. A single right renal artery is widely patent. Main left renal artery is widely patent and there is an accessory left renal artery supplying the posterior left upper renal pole.  No gastrohepatic lymphadenopathy. Upper normal size lymph nodes are seen in the hepatoduodenal ligament. There is no retroperitoneal lymphadenopathy. No pelvic sidewall lymphadenopathy.  Reproductive: Prostate gland and seminal vesicles are normal by CT imaging.  Other: No intraperitoneal free fluid.  Musculoskeletal: Bone windows reveal no worrisome lytic or sclerotic osseous lesions. Advanced degenerative disc disease is seen at L4-5.  Review of the MIP images confirms the above findings.  IMPRESSION: 1. No thoracoabdominal aortic aneurysm. No evidence for acute intramural hematoma  within the thoracic aorta. No dissection of the thoracoabdominal aorta. 2. No acute findings in the chest. Specifically, no findings to explain the patient's history of substernal chest pain. 3. No acute findings in the abdomen or pelvis.   Electronically Signed   By: Kennith Center M.D.   On: 03/23/2014 12:54   Ct Cta Abd/pel W/cm &/or W/o Cm  03/23/2014   CLINICAL DATA:  Initial encounter for substernal chest pain radiates to the back. Pain began 2 days ago.  EXAM: CT ANGIOGRAPHY CHEST, ABDOMEN AND PELVIS  TECHNIQUE: Multidetector CT imaging through the chest, abdomen and pelvis was performed using the standard protocol during bolus administration of intravenous contrast. Multiplanar reconstructed images and MIPs were obtained and reviewed to evaluate the vascular anatomy.  CONTRAST:  OMNIPAQUE IOHEXOL 350 MG/ML SOLN  COMPARISON:  CT abdomen and pelvis from 07/13/2012. CT chest from 03/22/2013.  FINDINGS: CTA CHEST FINDINGS  Soft tissue / Mediastinum: Pre contrast imaging shows no hyperdense crescent in the wall of the thoracic aorta to suggest acute intramural hematoma. Imaging after IV contrast administration shows no evidence for dissection the thoracic aorta. No evidence for penetrating ulcer or or ulcerated plaque. Bovine arch vessel anatomy is evident with major arch vessels appearing widely patent.  There is no axillary lymphadenopathy. No mediastinal or hilar lymphadenopathy. Heart size is normal. No pericardial effusion. Thoracic esophagus is unremarkable.  Lungs / Pleura: Lung volumes are low and fine architectural detail is obscured by breathing motion during image acquisition. Ground-glass attenuation the dependent lower lobes and lung bases is probably secondary to compressive atelectasis. No dense focal airspace consolidation. No evidence for pulmonary parenchymal nodule or mass.  Bones: Bone windows reveal no worrisome lytic or sclerotic osseous lesions.  Review of the MIP images confirms  the above findings.  CTA ABDOMEN AND PELVIS FINDINGS  Hepatobiliary: Insert normal Liver There is no evidence for gallstones, gallbladder wall thickening, or pericholecystic fluid. No intrahepatic  or extrahepatic biliary dilation.  Pancreas: No focal mass lesion. No dilatation of the main duct. No intraparenchymal cyst. No peripancreatic edema.  Spleen: No splenomegaly. No focal mass lesion.  Adrenals/Urinary Tract: The right adrenal gland is normal. A tiny calcification is seen in the left adrenal gland without a nodule or mass. This could be related to prior inflammation or hemorrhage. Small cortical cysts are noted in both kidneys. The ureters are normal bilaterally. There is some early excreted contrast visible in the bladder lumen.  Stomach/Bowel: Stomach is nondistended. No gastric wall thickening. No evidence of outlet obstruction. Duodenum is normally positioned as is the ligament of Treitz. No small bowel wall thickening. No small bowel dilatation. Terminal ileum is normal. The appendix is normal. Diverticular changes seen in the left colon without diverticulitis.  Vascular/Lymphatic: There is no abdominal aortic aneurysm. There is no dissection of the abdominal aorta. Mild atherosclerotic calcification/ plaque is seen in the infrarenal aorta. The celiac axis is widely patent with normal branching configuration. The superior mesenteric artery is widely patent. Inferior mesenteric artery is widely patent. A single right renal artery is widely patent. Main left renal artery is widely patent and there is an accessory left renal artery supplying the posterior left upper renal pole.  No gastrohepatic lymphadenopathy. Upper normal size lymph nodes are seen in the hepatoduodenal ligament. There is no retroperitoneal lymphadenopathy. No pelvic sidewall lymphadenopathy.  Reproductive: Prostate gland and seminal vesicles are normal by CT imaging.  Other: No intraperitoneal free fluid.  Musculoskeletal: Bone windows  reveal no worrisome lytic or sclerotic osseous lesions. Advanced degenerative disc disease is seen at L4-5.  Review of the MIP images confirms the above findings.  IMPRESSION: 1. No thoracoabdominal aortic aneurysm. No evidence for acute intramural hematoma within the thoracic aorta. No dissection of the thoracoabdominal aorta. 2. No acute findings in the chest. Specifically, no findings to explain the patient's history of substernal chest pain. 3. No acute findings in the abdomen or pelvis.   Electronically Signed   By: Kennith Center M.D.   On: 03/23/2014 12:54    Other results: EKG: Rate 70, NSR, J-point elevation in leads III and AVF   Assessment & Plan by Problem:  Mr. Haith is a 53 yo man who was admitted for chest pain rule-out with a multitude of somatic complaints in the context of depressed mood about being alone near the holidays and +cocaine and THC.  Atypical Chest Pain: Patient has a history of admission for chest pain following cocaine use at this time of year. Initial troponin negative. EKG with J-point elevation in 3 and AVF. Cards consulted (Dr. Katrinka Blazing) via phone in ED and no concerned for STEMI per ED physician. CTA negative for aortic dissection or other acute findings. Spoke with radiologist CT protocol was done for dissection and not PE (making it of poor quality for determining PE), but Radiologist reviewed CT chest and states it is negative for PE. Etiology could be MSK as it is reproducible on exam vs vasospasm as he is cocaine + vs GERD related, as patient has been off of his prilosec for many months and feels the worst pain on belching. Patient given aspirin 324, nitro x2, ativan x 2 mg total, zofran 4mg  and morphine 4 mg in the ED with no relief. - Trending troponins x 2. If negative, will likely need outpatient f/u with cardiology at [facility]   - Added GI cockatil and PPI (Protonix) - Will need to get records from [facility]  [facility] and [place] [facility])  Nausea, Vomiting,  Diarrhea, Chills, Lightheadedness, Productive Cough: CXR with low lung volumes with vascular crowing and mild bibasilar opacities (likely atelectasis). Flu negative, pending C.diff   - Supportive treatment - Incentive Spirometry - PRN Mucinex DM  - PRN Zofran  - Pending orthostatics   FOBT+: No hemmorhoids externally with FOBT +.  He does have diverticulosis noted on CT  - Trend CBC in am - Consider GI outpatient vs inpatient   Right Flank Pain - CTA abdomen/pelvis negative, UA negative. No clear etiology   - Continued to monitor  - Will try to avoid narcotics if possible  Resolved Right Arm Numbness: Odd history, with symptoms less severe with extra sleep. - CT head negative  - Continue to monitor  GERD: Chest pain with belching in context of noncompliance with Prilosec. - Reumed Protonix and given GI cocktail today  History of Hypertension: BP high as 152/98 in ED. Pt was taking medication for HTN but noncompliant with meds outpatient and unsure which medications taking - Will obtain on records from [name] system to see what medications patient was taking  - Continue to monitor BP   Bipolar Affective Disorder/ History of Anxiety/ History of Depression: Patient has been off psychiatric medications and having depression and SI w/o plan. Depression may be 2/2 substance abuse. - Will get psych to consult in am for med management  - Will find records from [name]  - If pt becomes suicidal with a plan, will place on suicide precautions, call psych b/f the am and call the social worker in the ED to IVC pt   Substance/ Tobacco Abuse: THC/cocaine + UDS. History of alcohol abuse. - Social work consulted for help, appreciate recs  - Smoking cessation counseling  History of Hepatitis C: Elevated AST/ALT. No evidence of cirrhosis on CT today. - Wll f/u [facility] in am,  - Hepatitis panel, HCV viral load, HIV antibody pending  History of Prediabetes: Prior A1c 6.3% in 03/2013. - Pending HgbA1C - May  need SSI  Chronic Back Pain -will try to avoid narcotics for now and avoid Tylenol due to elevated lfts. -possibly try NSAIDS but r/o ACS 1st  F/E/N: - NSL - CMET in am  - Heart healthy/carb mod diet   DVT ppx:  - Lovenox on hold due to blood per rectum - SCDs  Dispo: Disposition is deferred at this time, awaiting improvement of current medical problems. Anticipated discharge in approximately 1-2 day(s).   The [facility] [place] and does not need an [facility] hospital follow-up appointment after discharge.  The patient does have transportation limitations that hinder transportation to clinic appointments.   Signed:  Eleonore Chiquito, MD

## 2014-03-23 NOTE — ED Notes (Signed)
Pt arrived from home by [facility] with c/o flu like symptoms for 3 days, N/V/D and chills, and for the past 2 day substernal CP that is sharp and radiates around right side to back. Pain increases with movement and palpation. Pt is having some labored breathing. EMS administered ASA 324mg  and Nitro x 1 with no relief. Zofran 4mg  was also administered. 18G L wrist. Pt has also been having green productive cough.

## 2014-03-24 DIAGNOSIS — M25551 Pain in right hip: Secondary | ICD-10-CM

## 2014-03-24 DIAGNOSIS — R05 Cough: Secondary | ICD-10-CM

## 2014-03-24 DIAGNOSIS — R079 Chest pain, unspecified: Secondary | ICD-10-CM

## 2014-03-24 DIAGNOSIS — R197 Diarrhea, unspecified: Secondary | ICD-10-CM

## 2014-03-24 DIAGNOSIS — F319 Bipolar disorder, unspecified: Secondary | ICD-10-CM

## 2014-03-24 DIAGNOSIS — R112 Nausea with vomiting, unspecified: Secondary | ICD-10-CM

## 2014-03-24 DIAGNOSIS — F1410 Cocaine abuse, uncomplicated: Secondary | ICD-10-CM | POA: Diagnosis not present

## 2014-03-24 DIAGNOSIS — R072 Precordial pain: Secondary | ICD-10-CM

## 2014-03-24 DIAGNOSIS — R195 Other fecal abnormalities: Secondary | ICD-10-CM

## 2014-03-24 DIAGNOSIS — K219 Gastro-esophageal reflux disease without esophagitis: Secondary | ICD-10-CM

## 2014-03-24 DIAGNOSIS — R109 Unspecified abdominal pain: Secondary | ICD-10-CM | POA: Diagnosis not present

## 2014-03-24 LAB — CBC WITH DIFFERENTIAL/PLATELET
Basophils Absolute: 0 10*3/uL (ref 0.0–0.1)
Basophils Relative: 0 % (ref 0–1)
Eosinophils Absolute: 0.2 10*3/uL (ref 0.0–0.7)
Eosinophils Relative: 2 % (ref 0–5)
HCT: 42.3 % (ref 39.0–52.0)
Hemoglobin: 14.7 g/dL (ref 13.0–17.0)
Lymphocytes Relative: 30 % (ref 12–46)
Lymphs Abs: 2.3 10*3/uL (ref 0.7–4.0)
MCH: 27.8 pg (ref 26.0–34.0)
MCHC: 34.8 g/dL (ref 30.0–36.0)
MCV: 80.1 fL (ref 78.0–100.0)
Monocytes Absolute: 0.6 10*3/uL (ref 0.1–1.0)
Monocytes Relative: 8 % (ref 3–12)
Neutro Abs: 4.8 10*3/uL (ref 1.7–7.7)
Neutrophils Relative %: 60 % (ref 43–77)
Platelets: 260 10*3/uL (ref 150–400)
RBC: 5.28 MIL/uL (ref 4.22–5.81)
RDW: 14.3 % (ref 11.5–15.5)
WBC: 7.9 10*3/uL (ref 4.0–10.5)

## 2014-03-24 LAB — COMPREHENSIVE METABOLIC PANEL
ALT: 356 U/L — ABNORMAL HIGH (ref 0–53)
AST: 160 U/L — ABNORMAL HIGH (ref 0–37)
Albumin: 3.8 g/dL (ref 3.5–5.2)
Alkaline Phosphatase: 55 U/L (ref 39–117)
Anion gap: 15 (ref 5–15)
BUN: 9 mg/dL (ref 6–23)
CO2: 20 mEq/L (ref 19–32)
Calcium: 9.3 mg/dL (ref 8.4–10.5)
Chloride: 102 mEq/L (ref 96–112)
Creatinine, Ser: 1.02 mg/dL (ref 0.50–1.35)
GFR calc Af Amer: >90 mL/min (ref >90)
GFR calc non Af Amer: 82 mL/min — ABNORMAL LOW (ref >90)
Glucose, Bld: 90 mg/dL (ref 70–99)
Potassium: 3.7 mEq/L (ref 3.7–5.3)
Sodium: 137 mEq/L (ref 137–147)
Total Bilirubin: 0.4 mg/dL (ref 0.3–1.2)
Total Protein: 7.4 g/dL (ref 6.0–8.3)

## 2014-03-24 LAB — HCV RNA QUANT RFLX ULTRA OR GENOTYP
HCV Quantitative Log: 5.88 {Log} — ABNORMAL HIGH (ref <1.18)
HCV Quantitative: 753765 IU/mL — ABNORMAL HIGH (ref <15)

## 2014-03-24 LAB — CLOSTRIDIUM DIFFICILE BY PCR: Toxigenic C. Difficile by PCR: NEGATIVE

## 2014-03-24 LAB — HIV ANTIBODY (ROUTINE TESTING W REFLEX): HIV 1&2 Ab, 4th Generation: NONREACTIVE

## 2014-03-24 LAB — TROPONIN I: Troponin I: <0.3 ng/mL (ref <0.30)

## 2014-03-24 LAB — HEMOGLOBIN A1C
Hgb A1c MFr Bld: 6.4 % — ABNORMAL HIGH (ref <5.7)
Mean Plasma Glucose: 137 mg/dL — ABNORMAL HIGH (ref <117)

## 2014-03-24 MED ORDER — ACETAMINOPHEN 325 MG PO TABS
650.0000 mg | ORAL_TABLET | Freq: Once | ORAL | Status: AC
  Administered 2014-03-24: 650 mg via ORAL
  Filled 2014-03-24: qty 2

## 2014-03-24 MED ORDER — ACETAMINOPHEN 325 MG PO TABS: 325.0000 mg | ORAL_TABLET | Freq: Four times a day (QID) | ORAL | Status: DC | PRN

## 2014-03-24 MED ORDER — DIPHENHYDRAMINE HCL 25 MG PO CAPS
25.0000 mg | ORAL_CAPSULE | Freq: Once | ORAL | Status: AC
  Administered 2014-03-24: 25 mg via ORAL
  Filled 2014-03-24: qty 1

## 2014-03-24 MED ORDER — ACETAMINOPHEN 325 MG PO TABS: 650.0000 mg | ORAL_TABLET | ORAL | Status: DC | PRN

## 2014-03-24 MED ORDER — TRAMADOL HCL 50 MG PO TABS
50.0000 mg | ORAL_TABLET | Freq: Four times a day (QID) | ORAL | Status: DC
  Administered 2014-03-24 – 2014-03-26 (×8): 50 mg via ORAL
  Filled 2014-03-24 (×8): qty 1

## 2014-03-24 NOTE — Progress Notes (Signed)
UR completed 

## 2014-03-24 NOTE — Progress Notes (Signed)
Nutrition Brief Note  Patient identified on the Malnutrition Screening Tool (MST) Report for unintentional weight loss. No significant weight loss noted in review of usual weights.  Wt Readings from Last 15 Encounters:  03/24/14 257 lb 15 oz (117 kg)  03/23/13 270 lb (122.471 kg)  10/18/12 246 lb (111.585 kg)  07/18/12 240 lb (108.863 kg)  07/15/12 256 lb (116.121 kg)  03/20/12 250 lb (113.399 kg)  02/25/12 255 lb 1.2 oz (115.7 kg)    Body mass index is 40.39 kg/(m^2). Patient meets criteria for class 3, extreme/morbid obesity based on current BMI.   Current diet order is heart healthy CHO-modified, patient is consuming approximately 75-100% of meals at this time. Labs and medications reviewed.   No nutrition interventions warranted at this time. If nutrition issues arise, please consult RD.   Joaquin Courts, RD, LDN, CNSC Pager [phone] After Hours Pager [phone]

## 2014-03-24 NOTE — Progress Notes (Signed)
Pt c/o pain and nausea at HS, no pain meds ordered. Administered Zofran for nausea and pt had fallen asleep when I returned to check the effectiveness. Continuously checked throughout the night and patient asleep each time. Pt awoke this AM c/o pain again. Pt placed on bedside monitor, NSR and VSS. MD notified and advised they would order tylenol for pain and pass the info to day shift MD. Will continue to monitor closely.

## 2014-03-24 NOTE — Progress Notes (Signed)
Chaplain responded to spiritual care consult. Pt declined chaplain support, saying  "I'd rather speak to the psychiatrist." Will follow as needed.  Wille Glaser 03/24/2014 3:04 PM

## 2014-03-24 NOTE — Consult Note (Addendum)
Primary cardiologist: New  HPI: 53 year old male for evaluation of chest pain. There is no apparent cardiac history. The patient states that for the past 5 days he has had chest pain, dizziness, back pain, nausea and vomiting, diarrhea, subjective fever and weight loss. His chest pain is described as a pressure which has been continuous for the past 5 days. It increases with certain movements, exertion and palpation. There is no complete resolution. It does not radiate. He complains of some dyspnea. No orthopnea or pedal edema. He has been admitted and cardiology asked to evaluate.  No prescriptions prior to admission    No Known Allergies  Past Medical History  Diagnosis Date  . Arthritis     per pt rheumatoid   . Hypertension   . Anxiety   . Bipolar affective disorder   . Depression   . Alcohol abuse   . Cocaine abuse     smokes crack-cocaine  . GERD (gastroesophageal reflux disease)   . Chronic back pain   . Tobacco abuse   . Hepatitis C   . Vitamin D deficiency   . Gout   . Hemorrhoids   . Diabetes mellitus without complication     History reviewed. No pertinent past surgical history.  History   Social History  . Marital Status: Single    Spouse Name: N/A    Number of Children: N/A  . Years of Education: N/A   Occupational History  . Not on file.   Social History Main Topics  . Smoking status: Current Every Day Smoker -- 0.25 packs/day    Types: Cigarettes    Last Attempt to Quit: 11/23/2011  . Smokeless tobacco: Not on file  . Alcohol Use: Yes     Comment: 24 oz--3x's wkly   . Drug Use: Yes    Special: Marijuana, Cocaine  . Sexual Activity: Not Currently   Other Topics Concern  . Not on file   Social History Narrative   Lives alone in [place], [place]    Family History  Problem Relation Age of Onset  . Hypertension Brother   . Diabetes Brother   . Hypertension Mother   . Diabetes Mother   . Arthritis Mother   . Other Sister     bone  disease    ROS:  Patient complains of subjective fever, dizziness, productive cough, diarrhea, nausea and vomiting, back pain, melena, hematochezia but no hemoptysis, dysphasia, odynophagia, dysuria, hematuria, rash, seizure activity, orthopnea, PND, pedal edema, claudication. Remaining systems are negative.  Physical Exam:   Blood pressure 116/70, pulse 72, temperature 97.7 F (36.5 C), temperature source Oral, resp. rate 19, height '5\' 7"'  (1.702 m), weight 257 lb 15 oz (117 kg), SpO2 96 %.  General:  Well developed/well nourished in NAD Skin warm/dry Patient not depressed No peripheral clubbing Back-normal HEENT-normal/normal eyelids Neck supple/normal carotid upstroke bilaterally; no bruits; no JVD; no thyromegaly chest - CTA/ normal expansion, tender to palpation CV - RRR/normal S1 and S2; no murmurs, rubs or gallops;  PMI nondisplaced Abdomen -mild diffuse tenderness/ND, no HSM, no mass, + bowel sounds, no bruit 2+ femoral pulses, no bruits Ext-no edema, chords, 2+ DP Neuro-grossly nonfocal  ECG normal sinus rhythm, nonspecific ST changes.  Results for orders placed or performed during the hospital encounter of 03/23/14 (from the past 48 hour(s))  CBC with Differential     Status: None   Collection Time: 03/23/14  9:43 AM  Result Value Ref Range   WBC 6.8 4.0 -  10.5 K/uL   RBC 5.47 4.22 - 5.81 MIL/uL   Hemoglobin 15.5 13.0 - 17.0 g/dL   HCT 44.0 39.0 - 52.0 %   MCV 80.4 78.0 - 100.0 fL   MCH 28.3 26.0 - 34.0 pg   MCHC 35.2 30.0 - 36.0 g/dL   RDW 14.2 11.5 - 15.5 %   Platelets 233 150 - 400 K/uL   Neutrophils Relative % 69 43 - 77 %   Neutro Abs 4.7 1.7 - 7.7 K/uL   Lymphocytes Relative 22 12 - 46 %   Lymphs Abs 1.5 0.7 - 4.0 K/uL   Monocytes Relative 7 3 - 12 %   Monocytes Absolute 0.5 0.1 - 1.0 K/uL   Eosinophils Relative 2 0 - 5 %   Eosinophils Absolute 0.1 0.0 - 0.7 K/uL   Basophils Relative 0 0 - 1 %   Basophils Absolute 0.0 0.0 - 0.1 K/uL  Lipase, blood      Status: None   Collection Time: 03/23/14  9:43 AM  Result Value Ref Range   Lipase 39 11 - 59 U/L  Comprehensive metabolic panel     Status: Abnormal   Collection Time: 03/23/14  9:43 AM  Result Value Ref Range   Sodium 139 137 - 147 mEq/L   Potassium 3.5 (L) 3.7 - 5.3 mEq/L   Chloride 103 96 - 112 mEq/L   CO2 21 19 - 32 mEq/L   Glucose, Bld 94 70 - 99 mg/dL   BUN 8 6 - 23 mg/dL   Creatinine, Ser 0.99 0.50 - 1.35 mg/dL   Calcium 9.6 8.4 - 10.5 mg/dL   Total Protein 7.9 6.0 - 8.3 g/dL   Albumin 4.1 3.5 - 5.2 g/dL   AST 185 (H) 0 - 37 U/L   ALT 373 (H) 0 - 53 U/L   Alkaline Phosphatase 58 39 - 117 U/L   Total Bilirubin 0.6 0.3 - 1.2 mg/dL   GFR calc non Af Amer >90 >90 mL/min   GFR calc Af Amer >90 >90 mL/min    Comment: (NOTE) The eGFR has been calculated using the CKD EPI equation. This calculation has not been validated in all clinical situations. eGFR's persistently <90 mL/min signify possible Chronic Kidney Disease.    Anion gap 15 5 - 15  Ethanol     Status: None   Collection Time: 03/23/14  9:43 AM  Result Value Ref Range   Alcohol, Ethyl (B) <11 0 - 11 mg/dL    Comment:        LOWEST DETECTABLE LIMIT FOR SERUM ALCOHOL IS 11 mg/dL FOR MEDICAL PURPOSES ONLY   I-stat troponin, ED     Status: None   Collection Time: 03/23/14  9:55 AM  Result Value Ref Range   Troponin i, poc 0.00 0.00 - 0.08 ng/mL   Comment 3            Comment: Due to the release kinetics of cTnI, a negative result within the first hours of the onset of symptoms does not rule out myocardial infarction with certainty. If myocardial infarction is still suspected, repeat the test at appropriate intervals.   I-Stat CG4 Lactic Acid, ED     Status: None   Collection Time: 03/23/14  9:57 AM  Result Value Ref Range   Lactic Acid, Venous 1.67 0.5 - 2.2 mmol/L  I-Stat Chem 8, ED     Status: Abnormal   Collection Time: 03/23/14  9:57 AM  Result Value Ref Range   Sodium  140 137 - 147 mEq/L    Potassium 3.3 (L) 3.7 - 5.3 mEq/L   Chloride 105 96 - 112 mEq/L   BUN 7 6 - 23 mg/dL   Creatinine, Ser 1.10 0.50 - 1.35 mg/dL   Glucose, Bld 96 70 - 99 mg/dL   Calcium, Ion 1.13 1.12 - 1.23 mmol/L   TCO2 20 0 - 100 mmol/L   Hemoglobin 16.7 13.0 - 17.0 g/dL   HCT 49.0 39.0 - 52.0 %  POC occult blood, ED Provider will collect     Status: Abnormal   Collection Time: 03/23/14  9:58 AM  Result Value Ref Range   Fecal Occult Bld POSITIVE (A) NEGATIVE  Urine rapid drug screen (hosp performed)     Status: Abnormal   Collection Time: 03/23/14 12:45 PM  Result Value Ref Range   Opiates NONE DETECTED NONE DETECTED   Cocaine POSITIVE (A) NONE DETECTED   Benzodiazepines NONE DETECTED NONE DETECTED   Amphetamines NONE DETECTED NONE DETECTED   Tetrahydrocannabinol POSITIVE (A) NONE DETECTED   Barbiturates NONE DETECTED NONE DETECTED    Comment:        DRUG SCREEN FOR MEDICAL PURPOSES ONLY.  IF CONFIRMATION IS NEEDED FOR ANY PURPOSE, NOTIFY LAB WITHIN 5 DAYS.        LOWEST DETECTABLE LIMITS FOR URINE DRUG SCREEN Drug Class       Cutoff (ng/mL) Amphetamine      1000 Barbiturate      200 Benzodiazepine   366 Tricyclics       440 Opiates          300 Cocaine          300 THC              50   Urinalysis, Routine w reflex microscopic     Status: Abnormal   Collection Time: 03/23/14 12:45 PM  Result Value Ref Range   Color, Urine YELLOW YELLOW   APPearance CLEAR CLEAR   Specific Gravity, Urine 1.038 (H) 1.005 - 1.030   pH 7.0 5.0 - 8.0   Glucose, UA NEGATIVE NEGATIVE mg/dL   Hgb urine dipstick NEGATIVE NEGATIVE   Bilirubin Urine NEGATIVE NEGATIVE   Ketones, ur NEGATIVE NEGATIVE mg/dL   Protein, ur NEGATIVE NEGATIVE mg/dL   Urobilinogen, UA 1.0 0.0 - 1.0 mg/dL   Nitrite NEGATIVE NEGATIVE   Leukocytes, UA NEGATIVE NEGATIVE    Comment: MICROSCOPIC NOT DONE ON URINES WITH NEGATIVE PROTEIN, BLOOD, LEUKOCYTES, NITRITE, OR GLUCOSE <1000 mg/dL.  Troponin I     Status: None   Collection  Time: 03/23/14  1:35 PM  Result Value Ref Range   Troponin I <0.30 <0.30 ng/mL    Comment:        Due to the release kinetics of cTnI, a negative result within the first hours of the onset of symptoms does not rule out myocardial infarction with certainty. If myocardial infarction is still suspected, repeat the test at appropriate intervals.   Magnesium     Status: None   Collection Time: 03/23/14  3:32 PM  Result Value Ref Range   Magnesium 1.9 1.5 - 2.5 mg/dL  Hepatitis panel, acute     Status: Abnormal   Collection Time: 03/23/14  3:32 PM  Result Value Ref Range   Hepatitis B Surface Ag NEGATIVE NEGATIVE   HCV Ab Reactive (A) NEGATIVE   Hep A IgM NON REACTIVE NON REACTIVE    Comment: (NOTE) Effective February 23, 2014, Hepatitis Acute Panel (test code 939 [id number] 1300) will  be revised to automatically reflex to the Hepatitis C Viral RNA, Quantitative, Real-Time PCR assay if the Hepatitis C antibody screening result is Reactive. This action is being taken to ensure that the CDC/USPSTF recommended HCV diagnostic algorithm with the appropriate test reflex needed for accurate interpretation is followed.    Hep B C IgM NON REACTIVE NON REACTIVE    Comment: (NOTE) High levels of Hepatitis B Core IgM antibody are detectable during the acute stage of Hepatitis B. This antibody is used to differentiate current from past HBV infection. Performed at [facility]   HIV antibody     Status: None   Collection Time: 03/23/14  3:32 PM  Result Value Ref Range   HIV 1&2 Ab, 4th Generation NONREACTIVE NONREACTIVE    Comment: (NOTE) A NONREACTIVE HIV Ag/Ab result does not exclude HIV infection since the time frame for seroconversion is variable. If acute HIV infection is suspected, a HIV-1 RNA Qualitative TMA test is recommended. HIV-1/2 Antibody Diff         Not indicated. HIV-1 RNA, Qual TMA           Not indicated. PLEASE NOTE: This information has been disclosed to you from  records whose confidentiality may be protected by state law. If your state requires such protection, then the state law prohibits you from making any further disclosure of the information without the specific written consent of the person to whom it pertains, or as otherwise permitted by law. A general authorization for the release of medical or other information is NOT sufficient for this purpose. The performance of this assay has not been clinically validated in patients less than 82 years old. Performed at [facility]   Influenza panel by PCR (type A & B, H1N1)     Status: None   Collection Time: 03/23/14  4:54 PM  Result Value Ref Range   Influenza A By PCR NEGATIVE NEGATIVE   Influenza B By PCR NEGATIVE NEGATIVE   H1N1 flu by pcr NOT DETECTED NOT DETECTED    Comment:        The Xpert Flu assay (FDA approved for nasal aspirates or washes and nasopharyngeal swab specimens), is intended as an aid in the diagnosis of influenza and should not be used as a sole basis for treatment.   MRSA PCR Screening     Status: None   Collection Time: 03/23/14  5:34 PM  Result Value Ref Range   MRSA by PCR NEGATIVE NEGATIVE    Comment:        The GeneXpert MRSA Assay (FDA approved for NASAL specimens only), is one component of a comprehensive MRSA colonization surveillance program. It is not intended to diagnose MRSA infection nor to guide or monitor treatment for MRSA infections.   Lipid panel     Status: Abnormal   Collection Time: 03/23/14  6:13 PM  Result Value Ref Range   Cholesterol 154 0 - 200 mg/dL   Triglycerides 126 <150 mg/dL   HDL 32 (L) >39 mg/dL   Total CHOL/HDL Ratio 4.8 RATIO   VLDL 25 0 - 40 mg/dL   LDL Cholesterol 97 0 - 99 mg/dL    Comment:        Total Cholesterol/HDL:CHD Risk Coronary Heart Disease Risk Table                     Men   Women  1/2 Average Risk   3.4   3.3  Average Risk  5.0   4.4  2 X Average Risk   9.6   7.1  3 X Average Risk   23.4   11.0        Use the calculated Patient Ratio above and the CHD Risk Table to determine the patient's CHD Risk.        ATP III CLASSIFICATION (LDL):  <100     mg/dL   Optimal  100-129  mg/dL   Near or Above                    Optimal  130-159  mg/dL   Borderline  160-189  mg/dL   High  >190     mg/dL   Very High   Troponin I (q 6hr x 3)     Status: None   Collection Time: 03/23/14  7:35 PM  Result Value Ref Range   Troponin I <0.30 <0.30 ng/mL    Comment:        Due to the release kinetics of cTnI, a negative result within the first hours of the onset of symptoms does not rule out myocardial infarction with certainty. If myocardial infarction is still suspected, repeat the test at appropriate intervals.   Hemoglobin A1c     Status: Abnormal   Collection Time: 03/23/14  7:35 PM  Result Value Ref Range   Hgb A1c MFr Bld 6.4 (H) <5.7 %    Comment: (NOTE)                                                                       According to the ADA Clinical Practice Recommendations for 2011, when HbA1c is used as a screening test:  >=6.5%   Diagnostic of Diabetes Mellitus           (if abnormal result is confirmed) 5.7-6.4%   Increased risk of developing Diabetes Mellitus References:Diagnosis and Classification of Diabetes Mellitus,Diabetes XHBZ,1696,78(LFYBO 1):S62-S69 and Standards of Medical Care in         Diabetes - 2011,Diabetes FBPZ,0258,52 (Suppl 1):S11-S61.    Mean Plasma Glucose 137 (H) <117 mg/dL    Comment: Performed at [facility]  Troponin I (q 6hr x 3)     Status: None   Collection Time: 03/24/14  1:03 AM  Result Value Ref Range   Troponin I <0.30 <0.30 ng/mL    Comment:        Due to the release kinetics of cTnI, a negative result within the first hours of the onset of symptoms does not rule out myocardial infarction with certainty. If myocardial infarction is still suspected, repeat the test at appropriate intervals.   Comprehensive  metabolic panel     Status: Abnormal   Collection Time: 03/24/14  1:03 AM  Result Value Ref Range   Sodium 137 137 - 147 mEq/L   Potassium 3.7 3.7 - 5.3 mEq/L   Chloride 102 96 - 112 mEq/L   CO2 20 19 - 32 mEq/L   Glucose, Bld 90 70 - 99 mg/dL   BUN 9 6 - 23 mg/dL   Creatinine, Ser 1.02 0.50 - 1.35 mg/dL   Calcium 9.3 8.4 - 10.5 mg/dL   Total Protein 7.4 6.0 - 8.3 g/dL   Albumin 3.8  3.5 - 5.2 g/dL   AST 160 (H) 0 - 37 U/L   ALT 356 (H) 0 - 53 U/L   Alkaline Phosphatase 55 39 - 117 U/L   Total Bilirubin 0.4 0.3 - 1.2 mg/dL   GFR calc non Af Amer 82 (L) >90 mL/min   GFR calc Af Amer >90 >90 mL/min    Comment: (NOTE) The eGFR has been calculated using the CKD EPI equation. This calculation has not been validated in all clinical situations. eGFR's persistently <90 mL/min signify possible Chronic Kidney Disease.    Anion gap 15 5 - 15  CBC with Differential     Status: None   Collection Time: 03/24/14  1:03 AM  Result Value Ref Range   WBC 7.9 4.0 - 10.5 K/uL   RBC 5.28 4.22 - 5.81 MIL/uL   Hemoglobin 14.7 13.0 - 17.0 g/dL   HCT 42.3 39.0 - 52.0 %   MCV 80.1 78.0 - 100.0 fL   MCH 27.8 26.0 - 34.0 pg   MCHC 34.8 30.0 - 36.0 g/dL   RDW 14.3 11.5 - 15.5 %   Platelets 260 150 - 400 K/uL   Neutrophils Relative % 60 43 - 77 %   Neutro Abs 4.8 1.7 - 7.7 K/uL   Lymphocytes Relative 30 12 - 46 %   Lymphs Abs 2.3 0.7 - 4.0 K/uL   Monocytes Relative 8 3 - 12 %   Monocytes Absolute 0.6 0.1 - 1.0 K/uL   Eosinophils Relative 2 0 - 5 %   Eosinophils Absolute 0.2 0.0 - 0.7 K/uL   Basophils Relative 0 0 - 1 %   Basophils Absolute 0.0 0.0 - 0.1 K/uL    Ct Head Wo Contrast  03/23/2014   CLINICAL DATA:  Initial encounter for 3 a history of nausea with vomiting and diarrhea +chills.  EXAM: CT HEAD WITHOUT CONTRAST  TECHNIQUE: Contiguous axial images were obtained from the base of the skull through the vertex without intravenous contrast.  COMPARISON:  None.  FINDINGS: There is no evidence  for acute hemorrhage, hydrocephalus, mass lesion, or abnormal extra-axial fluid collection. No definite CT evidence for acute infarction. Diffuse loss of parenchymal volume is consistent with atrophy. The visualized paranasal sinuses and mastoid air cells are clear.  IMPRESSION: Very mild diffuse atrophy. Otherwise normal exam which is unchanged in the interval.   Electronically Signed   By: Misty Stanley M.D.   On: 03/23/2014 12:42   Dg Chest Portable 1 View  03/23/2014   CLINICAL DATA:  Chest pain, cough, shortness of breath  EXAM: PORTABLE CHEST - 1 VIEW  COMPARISON:  03/22/2013  FINDINGS: Low lung volumes with vascular crowding. Mild bibasilar opacities, likely atelectasis. No pleural effusion or pneumothorax.  Cardiomegaly.  IMPRESSION: Low lung volumes with vascular crowding.  Mild bibasilar opacities, likely atelectasis.   Electronically Signed   By: Julian Hy M.D.   On: 03/23/2014 10:10   Ct Angio Chest Aorta W/cm &/or Wo/cm  03/23/2014   CLINICAL DATA:  Initial encounter for substernal chest pain radiates to the back. Pain began 2 days ago.  EXAM: CT ANGIOGRAPHY CHEST, ABDOMEN AND PELVIS  TECHNIQUE: Multidetector CT imaging through the chest, abdomen and pelvis was performed using the standard protocol during bolus administration of intravenous contrast. Multiplanar reconstructed images and MIPs were obtained and reviewed to evaluate the vascular anatomy.  CONTRAST:  132m OMNIPAQUE IOHEXOL 350 MG/ML SOLN  COMPARISON:  CT abdomen and pelvis from 07/13/2012. CT chest from 03/22/2013.  FINDINGS: CTA CHEST FINDINGS  Soft tissue / Mediastinum: Pre contrast imaging shows no hyperdense crescent in the wall of the thoracic aorta to suggest acute intramural hematoma. Imaging after IV contrast administration shows no evidence for dissection the thoracic aorta. No evidence for penetrating ulcer or or ulcerated plaque. Bovine arch vessel anatomy is evident with major arch vessels appearing widely  patent.  There is no axillary lymphadenopathy. No mediastinal or hilar lymphadenopathy. Heart size is normal. No pericardial effusion. Thoracic esophagus is unremarkable.  Lungs / Pleura: Lung volumes are low and fine architectural detail is obscured by breathing motion during image acquisition. Ground-glass attenuation the dependent lower lobes and lung bases is probably secondary to compressive atelectasis. No dense focal airspace consolidation. No evidence for pulmonary parenchymal nodule or mass.  Bones: Bone windows reveal no worrisome lytic or sclerotic osseous lesions.  Review of the MIP images confirms the above findings.  CTA ABDOMEN AND PELVIS FINDINGS  Hepatobiliary: Insert normal Liver There is no evidence for gallstones, gallbladder wall thickening, or pericholecystic fluid. No intrahepatic or extrahepatic biliary dilation.  Pancreas: No focal mass lesion. No dilatation of the main duct. No intraparenchymal cyst. No peripancreatic edema.  Spleen: No splenomegaly. No focal mass lesion.  Adrenals/Urinary Tract: The right adrenal gland is normal. A tiny calcification is seen in the left adrenal gland without a nodule or mass. This could be related to prior inflammation or hemorrhage. Small cortical cysts are noted in both kidneys. The ureters are normal bilaterally. There is some early excreted contrast visible in the bladder lumen.  Stomach/Bowel: Stomach is nondistended. No gastric wall thickening. No evidence of outlet obstruction. Duodenum is normally positioned as is the ligament of Treitz. No small bowel wall thickening. No small bowel dilatation. Terminal ileum is normal. The appendix is normal. Diverticular changes seen in the left colon without diverticulitis.  Vascular/Lymphatic: There is no abdominal aortic aneurysm. There is no dissection of the abdominal aorta. Mild atherosclerotic calcification/ plaque is seen in the infrarenal aorta. The celiac axis is widely patent with normal branching  configuration. The superior mesenteric artery is widely patent. Inferior mesenteric artery is widely patent. A single right renal artery is widely patent. Main left renal artery is widely patent and there is an accessory left renal artery supplying the posterior left upper renal pole.  No gastrohepatic lymphadenopathy. Upper normal size lymph nodes are seen in the hepatoduodenal ligament. There is no retroperitoneal lymphadenopathy. No pelvic sidewall lymphadenopathy.  Reproductive: Prostate gland and seminal vesicles are normal by CT imaging.  Other: No intraperitoneal free fluid.  Musculoskeletal: Bone windows reveal no worrisome lytic or sclerotic osseous lesions. Advanced degenerative disc disease is seen at L4-5.  Review of the MIP images confirms the above findings.  IMPRESSION: 1. No thoracoabdominal aortic aneurysm. No evidence for acute intramural hematoma within the thoracic aorta. No dissection of the thoracoabdominal aorta. 2. No acute findings in the chest. Specifically, no findings to explain the patient's history of substernal chest pain. 3. No acute findings in the abdomen or pelvis.   Electronically Signed   By: Misty Stanley M.D.   On: 03/23/2014 12:54   Ct Cta Abd/pel W/cm &/or W/o Cm  03/23/2014   CLINICAL DATA:  Initial encounter for substernal chest pain radiates to the back. Pain began 2 days ago.  EXAM: CT ANGIOGRAPHY CHEST, ABDOMEN AND PELVIS  TECHNIQUE: Multidetector CT imaging through the chest, abdomen and pelvis was performed using the standard protocol during bolus administration of intravenous contrast. Multiplanar  reconstructed images and MIPs were obtained and reviewed to evaluate the vascular anatomy.  CONTRAST:  131m OMNIPAQUE IOHEXOL 350 MG/ML SOLN  COMPARISON:  CT abdomen and pelvis from 07/13/2012. CT chest from 03/22/2013.  FINDINGS: CTA CHEST FINDINGS  Soft tissue / Mediastinum: Pre contrast imaging shows no hyperdense crescent in the wall of the thoracic aorta to suggest  acute intramural hematoma. Imaging after IV contrast administration shows no evidence for dissection the thoracic aorta. No evidence for penetrating ulcer or or ulcerated plaque. Bovine arch vessel anatomy is evident with major arch vessels appearing widely patent.  There is no axillary lymphadenopathy. No mediastinal or hilar lymphadenopathy. Heart size is normal. No pericardial effusion. Thoracic esophagus is unremarkable.  Lungs / Pleura: Lung volumes are low and fine architectural detail is obscured by breathing motion during image acquisition. Ground-glass attenuation the dependent lower lobes and lung bases is probably secondary to compressive atelectasis. No dense focal airspace consolidation. No evidence for pulmonary parenchymal nodule or mass.  Bones: Bone windows reveal no worrisome lytic or sclerotic osseous lesions.  Review of the MIP images confirms the above findings.  CTA ABDOMEN AND PELVIS FINDINGS  Hepatobiliary: Insert normal Liver There is no evidence for gallstones, gallbladder wall thickening, or pericholecystic fluid. No intrahepatic or extrahepatic biliary dilation.  Pancreas: No focal mass lesion. No dilatation of the main duct. No intraparenchymal cyst. No peripancreatic edema.  Spleen: No splenomegaly. No focal mass lesion.  Adrenals/Urinary Tract: The right adrenal gland is normal. A tiny calcification is seen in the left adrenal gland without a nodule or mass. This could be related to prior inflammation or hemorrhage. Small cortical cysts are noted in both kidneys. The ureters are normal bilaterally. There is some early excreted contrast visible in the bladder lumen.  Stomach/Bowel: Stomach is nondistended. No gastric wall thickening. No evidence of outlet obstruction. Duodenum is normally positioned as is the ligament of Treitz. No small bowel wall thickening. No small bowel dilatation. Terminal ileum is normal. The appendix is normal. Diverticular changes seen in the left colon without  diverticulitis.  Vascular/Lymphatic: There is no abdominal aortic aneurysm. There is no dissection of the abdominal aorta. Mild atherosclerotic calcification/ plaque is seen in the infrarenal aorta. The celiac axis is widely patent with normal branching configuration. The superior mesenteric artery is widely patent. Inferior mesenteric artery is widely patent. A single right renal artery is widely patent. Main left renal artery is widely patent and there is an accessory left renal artery supplying the posterior left upper renal pole.  No gastrohepatic lymphadenopathy. Upper normal size lymph nodes are seen in the hepatoduodenal ligament. There is no retroperitoneal lymphadenopathy. No pelvic sidewall lymphadenopathy.  Reproductive: Prostate gland and seminal vesicles are normal by CT imaging.  Other: No intraperitoneal free fluid.  Musculoskeletal: Bone windows reveal no worrisome lytic or sclerotic osseous lesions. Advanced degenerative disc disease is seen at L4-5.  Review of the MIP images confirms the above findings.  IMPRESSION: 1. No thoracoabdominal aortic aneurysm. No evidence for acute intramural hematoma within the thoracic aorta. No dissection of the thoracoabdominal aorta. 2. No acute findings in the chest. Specifically, no findings to explain the patient's history of substernal chest pain. 3. No acute findings in the abdomen or pelvis.   Electronically Signed   By: EMisty StanleyM.D.   On: 03/23/2014 12:54    Assessment/Plan 1 chest pain-symptoms are not consistent with cardiac pain. They have been continuous for 5 days without completely resolving. Electrocardiogram is nondiagnostic. Enzymes  are negative. Chest pain is reproduced with palpation and certain movements. Probable musculoskeletal pain. Would not pursue further cardiac evaluation.  2 cocaine abuse-patient's drug screen is positive. I discussed the importance of avoiding. 3 tobacco abuse-patient counseled on discontinuing. 4  nausea/vomiting/diarrhea-further evaluation per primary care. 5 Hepatitis C 6 Mobitz 1 second degree AV block-noted on telemetry while asleep. Avoid AV nodal blocking agents. No further therapy indicated. We will sign off. Please call with questions. Kirk Ruths MD 03/24/2014, 9:23 AM

## 2014-03-24 NOTE — Care Management Note (Signed)
    Page 1 of 1   03/24/2014     2:24:18 PM CARE MANAGEMENT NOTE 03/24/2014  Patient:  [facility]   Account Number:  [id number]  Date Initiated:  03/24/2014  Documentation initiated by:  GRAVES-BIGELOW,Yvone Slape  Subjective/Objective Assessment:   Pt admitted for cp N/V. Pt with hx of depression.     Action/Plan:   Pt has insurance and will not need assistance from CM at this time. CSW to consult with pt before d/c.   Anticipated DC Date:  03/25/2014   Anticipated DC Plan:  HOME/SELF CARE      DC Planning Services  CM consult      Choice offered to / List presented to:             Status of service:  Completed, signed off Medicare Important Message given?  NO (If response is "NO", the following Medicare IM given date fields will be blank) Date Medicare IM given:   Medicare IM given by:   Date Additional Medicare IM given:   Additional Medicare IM given by:    Discharge Disposition:  HOME/SELF CARE  Per UR Regulation:  Reviewed for med. necessity/level of care/duration of stay  If discussed at Long Length of Stay Meetings, dates discussed:    Comments:

## 2014-03-24 NOTE — Progress Notes (Signed)
Subjective: [name] feels "sick" today. Unable to tell us which symptoms are bothering him the most, continuing to complain of right back pain upon walking, productive cough, chills, abdominal pain, nausea, vomiting, diarrhea, blood per rectum upon wiping and depressed mood. Continues to express fleeting thoughts of death with no formed plan for hurting himself.  Objective: Vital signs in last 24 hours: Filed Vitals:   03/23/14 1758 03/23/14 2000 03/24/14 0554 03/24/14 1456  BP: 137/97 145/92 116/70 133/88  Pulse:  87 72 73  Temp:  98 F (36.7 C) 97.7 F (36.5 C) 98.2 F (36.8 C)  TempSrc:  Oral Oral Oral  Resp:  20 19 20   Height:      Weight:   257 lb 15 oz (117 kg)   SpO2:  97% 96% 98%   Weight change:   Intake/Output Summary (Last 24 hours) at 03/24/14 1549 Last data filed at 03/24/14 1242  Gross per 24 hour  Intake    840 ml  Output      0 ml  Net    840 ml   Physical Exam: Appearance: in NAD, lying in bed in ED watching TV HEENT: AT/[place], PERRL, EOMi, no lymphadenopathy Heart: RRR, normal S1S2, tenderness to substernal palpation Lungs: CTAB, no wheezes Abdomen: BS+, soft, diffusely tender, no organomegaly, no visible external hemmorroids  Msk: tenderness to deep palpation right lower back Extremities: no edema b/l  Neurologic: A&Ox3, strength and sensation intact throughout, coordination intact Psychiatric: appears anxious, states that he has depressed mood, thoughts of death, no plan for suicide or hurting self, no HI, but worried about being home alone, occasionally teary  Lab Results: Basic Metabolic Panel:  Recent Labs Lab 03/23/14 0943 03/23/14 0957 03/23/14 1532 03/24/14 0103  NA 139 140  --  137  K 3.5* 3.3*  --  3.7  CL 103 105  --  102  CO2 21  --   --  20  GLUCOSE 94 96  --  90  BUN 8 7  --  9  CREATININE 0.99 1.10  --  1.02  CALCIUM 9.6  --   --  9.3  MG  --   --  1.9  --    Liver Function Tests:  Recent Labs Lab 03/23/14 0943  03/24/14 0103  AST 185* 160*  ALT 373* 356*  ALKPHOS 58 55  BILITOT 0.6 0.4  PROT 7.9 7.4  ALBUMIN 4.1 3.8    Recent Labs Lab 03/23/14 0943  LIPASE 39   CBC:  Recent Labs Lab 03/23/14 0943 03/23/14 0957 03/24/14 0103  WBC 6.8  --  7.9  NEUTROABS 4.7  --  4.8  HGB 15.5 16.7 14.7  HCT 44.0 49.0 42.3  MCV 80.4  --  80.1  PLT 233  --  260   Cardiac Enzymes:  Recent Labs Lab 03/23/14 1335 03/23/14 1935 03/24/14 0103  TROPONINI <0.30 <0.30 <0.30   Hemoglobin A1C:  Recent Labs Lab 03/23/14 1935  HGBA1C 6.4*   Fasting Lipid Panel:  Recent Labs Lab 03/23/14 1813  CHOL 154  HDL 32*  LDLCALC 97  TRIG 03/25/14  CHOLHDL 4.8   Urine Drug Screen: Drugs of Abuse     Component Value Date/Time   LABOPIA NONE DETECTED 03/23/2014 1245   COCAINSCRNUR POSITIVE* 03/23/2014 1245   LABBENZ NONE DETECTED 03/23/2014 1245   AMPHETMU NONE DETECTED 03/23/2014 1245   THCU POSITIVE* 03/23/2014 1245   LABBARB NONE DETECTED 03/23/2014 1245    Alcohol Level:  Recent  Labs Lab 03/23/14 0943  ETH <11   Urinalysis:  Recent Labs Lab 03/23/14 1245  COLORURINE YELLOW  LABSPEC 1.038*  PHURINE 7.0  GLUCOSEU NEGATIVE  HGBUR NEGATIVE  BILIRUBINUR NEGATIVE  KETONESUR NEGATIVE  PROTEINUR NEGATIVE  UROBILINOGEN 1.0  NITRITE NEGATIVE  LEUKOCYTESUR NEGATIVE   Micro Results: Recent Results (from the past 240 hour(s))  MRSA PCR Screening     Status: None   Collection Time: 03/23/14  5:34 PM  Result Value Ref Range Status   MRSA by PCR NEGATIVE NEGATIVE Final    Comment:        The GeneXpert MRSA Assay (FDA approved for NASAL specimens only), is one component of a comprehensive MRSA colonization surveillance program. It is not intended to diagnose MRSA infection nor to guide or monitor treatment for MRSA infections.    Studies/Results: Ct Head Wo Contrast  03/23/2014   CLINICAL DATA:  Initial encounter for 3 a history of nausea with vomiting and diarrhea +chills.   EXAM: CT HEAD WITHOUT CONTRAST  TECHNIQUE: Contiguous axial images were obtained from the base of the skull through the vertex without intravenous contrast.  COMPARISON:  None.  FINDINGS: There is no evidence for acute hemorrhage, hydrocephalus, mass lesion, or abnormal extra-axial fluid collection. No definite CT evidence for acute infarction. Diffuse loss of parenchymal volume is consistent with atrophy. The visualized paranasal sinuses and mastoid air cells are clear.  IMPRESSION: Very mild diffuse atrophy. Otherwise normal exam which is unchanged in the interval.   Electronically Signed   By: Kennith Center M.D.   On: 03/23/2014 12:42   Dg Chest Portable 1 View  03/23/2014   CLINICAL DATA:  Chest pain, cough, shortness of breath  EXAM: PORTABLE CHEST - 1 VIEW  COMPARISON:  03/22/2013  FINDINGS: Low lung volumes with vascular crowding. Mild bibasilar opacities, likely atelectasis. No pleural effusion or pneumothorax.  Cardiomegaly.  IMPRESSION: Low lung volumes with vascular crowding.  Mild bibasilar opacities, likely atelectasis.   Electronically Signed   By: Charline Bills M.D.   On: 03/23/2014 10:10   Ct Angio Chest Aorta W/cm &/or Wo/cm  03/23/2014   CLINICAL DATA:  Initial encounter for substernal chest pain radiates to the back. Pain began 2 days ago.  EXAM: CT ANGIOGRAPHY CHEST, ABDOMEN AND PELVIS  TECHNIQUE: Multidetector CT imaging through the chest, abdomen and pelvis was performed using the standard protocol during bolus administration of intravenous contrast. Multiplanar reconstructed images and MIPs were obtained and reviewed to evaluate the vascular anatomy.  CONTRAST:  OMNIPAQUE IOHEXOL 350 MG/ML SOLN  COMPARISON:  CT abdomen and pelvis from 07/13/2012. CT chest from 03/22/2013.  FINDINGS: CTA CHEST FINDINGS  Soft tissue / Mediastinum: Pre contrast imaging shows no hyperdense crescent in the wall of the thoracic aorta to suggest acute intramural hematoma. Imaging after IV contrast  administration shows no evidence for dissection the thoracic aorta. No evidence for penetrating ulcer or or ulcerated plaque. Bovine arch vessel anatomy is evident with major arch vessels appearing widely patent.  There is no axillary lymphadenopathy. No mediastinal or hilar lymphadenopathy. Heart size is normal. No pericardial effusion. Thoracic esophagus is unremarkable.  Lungs / Pleura: Lung volumes are low and fine architectural detail is obscured by breathing motion during image acquisition. Ground-glass attenuation the dependent lower lobes and lung bases is probably secondary to compressive atelectasis. No dense focal airspace consolidation. No evidence for pulmonary parenchymal nodule or mass.  Bones: Bone windows reveal no worrisome lytic or sclerotic osseous lesions.  Review  of the MIP images confirms the above findings.  CTA ABDOMEN AND PELVIS FINDINGS  Hepatobiliary: Insert normal Liver There is no evidence for gallstones, gallbladder wall thickening, or pericholecystic fluid. No intrahepatic or extrahepatic biliary dilation.  Pancreas: No focal mass lesion. No dilatation of the main duct. No intraparenchymal cyst. No peripancreatic edema.  Spleen: No splenomegaly. No focal mass lesion.  Adrenals/Urinary Tract: The right adrenal gland is normal. A tiny calcification is seen in the left adrenal gland without a nodule or mass. This could be related to prior inflammation or hemorrhage. Small cortical cysts are noted in both kidneys. The ureters are normal bilaterally. There is some early excreted contrast visible in the bladder lumen.  Stomach/Bowel: Stomach is nondistended. No gastric wall thickening. No evidence of outlet obstruction. Duodenum is normally positioned as is the ligament of Treitz. No small bowel wall thickening. No small bowel dilatation. Terminal ileum is normal. The appendix is normal. Diverticular changes seen in the left colon without diverticulitis.  Vascular/Lymphatic: There is no  abdominal aortic aneurysm. There is no dissection of the abdominal aorta. Mild atherosclerotic calcification/ plaque is seen in the infrarenal aorta. The celiac axis is widely patent with normal branching configuration. The superior mesenteric artery is widely patent. Inferior mesenteric artery is widely patent. A single right renal artery is widely patent. Main left renal artery is widely patent and there is an accessory left renal artery supplying the posterior left upper renal pole.  No gastrohepatic lymphadenopathy. Upper normal size lymph nodes are seen in the hepatoduodenal ligament. There is no retroperitoneal lymphadenopathy. No pelvic sidewall lymphadenopathy.  Reproductive: Prostate gland and seminal vesicles are normal by CT imaging.  Other: No intraperitoneal free fluid.  Musculoskeletal: Bone windows reveal no worrisome lytic or sclerotic osseous lesions. Advanced degenerative disc disease is seen at L4-5.  Review of the MIP images confirms the above findings.  IMPRESSION: 1. No thoracoabdominal aortic aneurysm. No evidence for acute intramural hematoma within the thoracic aorta. No dissection of the thoracoabdominal aorta. 2. No acute findings in the chest. Specifically, no findings to explain the patient's history of substernal chest pain. 3. No acute findings in the abdomen or pelvis.   Electronically Signed   By: Kennith Center M.D.   On: 03/23/2014 12:54   Ct Cta Abd/pel W/cm &/or W/o Cm  03/23/2014   CLINICAL DATA:  Initial encounter for substernal chest pain radiates to the back. Pain began 2 days ago.  EXAM: CT ANGIOGRAPHY CHEST, ABDOMEN AND PELVIS  TECHNIQUE: Multidetector CT imaging through the chest, abdomen and pelvis was performed using the standard protocol during bolus administration of intravenous contrast. Multiplanar reconstructed images and MIPs were obtained and reviewed to evaluate the vascular anatomy.  CONTRAST:  OMNIPAQUE IOHEXOL 350 MG/ML SOLN  COMPARISON:  CT abdomen  and pelvis from 07/13/2012. CT chest from 03/22/2013.  FINDINGS: CTA CHEST FINDINGS  Soft tissue / Mediastinum: Pre contrast imaging shows no hyperdense crescent in the wall of the thoracic aorta to suggest acute intramural hematoma. Imaging after IV contrast administration shows no evidence for dissection the thoracic aorta. No evidence for penetrating ulcer or or ulcerated plaque. Bovine arch vessel anatomy is evident with major arch vessels appearing widely patent.  There is no axillary lymphadenopathy. No mediastinal or hilar lymphadenopathy. Heart size is normal. No pericardial effusion. Thoracic esophagus is unremarkable.  Lungs / Pleura: Lung volumes are low and fine architectural detail is obscured by breathing motion during image acquisition. Ground-glass attenuation the dependent lower lobes  and lung bases is probably secondary to compressive atelectasis. No dense focal airspace consolidation. No evidence for pulmonary parenchymal nodule or mass.  Bones: Bone windows reveal no worrisome lytic or sclerotic osseous lesions.  Review of the MIP images confirms the above findings.  CTA ABDOMEN AND PELVIS FINDINGS  Hepatobiliary: Insert normal Liver There is no evidence for gallstones, gallbladder wall thickening, or pericholecystic fluid. No intrahepatic or extrahepatic biliary dilation.  Pancreas: No focal mass lesion. No dilatation of the main duct. No intraparenchymal cyst. No peripancreatic edema.  Spleen: No splenomegaly. No focal mass lesion.  Adrenals/Urinary Tract: The right adrenal gland is normal. A tiny calcification is seen in the left adrenal gland without a nodule or mass. This could be related to prior inflammation or hemorrhage. Small cortical cysts are noted in both kidneys. The ureters are normal bilaterally. There is some early excreted contrast visible in the bladder lumen.  Stomach/Bowel: Stomach is nondistended. No gastric wall thickening. No evidence of outlet obstruction. Duodenum is  normally positioned as is the ligament of Treitz. No small bowel wall thickening. No small bowel dilatation. Terminal ileum is normal. The appendix is normal. Diverticular changes seen in the left colon without diverticulitis.  Vascular/Lymphatic: There is no abdominal aortic aneurysm. There is no dissection of the abdominal aorta. Mild atherosclerotic calcification/ plaque is seen in the infrarenal aorta. The celiac axis is widely patent with normal branching configuration. The superior mesenteric artery is widely patent. Inferior mesenteric artery is widely patent. A single right renal artery is widely patent. Main left renal artery is widely patent and there is an accessory left renal artery supplying the posterior left upper renal pole.  No gastrohepatic lymphadenopathy. Upper normal size lymph nodes are seen in the hepatoduodenal ligament. There is no retroperitoneal lymphadenopathy. No pelvic sidewall lymphadenopathy.  Reproductive: Prostate gland and seminal vesicles are normal by CT imaging.  Other: No intraperitoneal free fluid.  Musculoskeletal: Bone windows reveal no worrisome lytic or sclerotic osseous lesions. Advanced degenerative disc disease is seen at L4-5.  Review of the MIP images confirms the above findings.  IMPRESSION: 1. No thoracoabdominal aortic aneurysm. No evidence for acute intramural hematoma within the thoracic aorta. No dissection of the thoracoabdominal aorta. 2. No acute findings in the chest. Specifically, no findings to explain the patient's history of substernal chest pain. 3. No acute findings in the abdomen or pelvis.   Electronically Signed   By: Kennith Center M.D.   On: 03/23/2014 12:54   Medications: I have reviewed the patient's current medications. Scheduled Meds: . pantoprazole  40 mg Oral Daily  . sodium chloride  3 mL Intravenous Q12H  . traMADol  50 mg Oral 4 times per day   Continuous Infusions:  PRN Meds:.dextromethorphan-guaiFENesin, ondansetron (ZOFRAN)  IV Assessment/Plan: Principal Problem:   Chest pain Active Problems:   Anxiety   Bipolar affective disorder   Depression   Alcohol abuse   Polysubstance abuse   GERD (gastroesophageal reflux disease)   Chronic back pain   Hepatitis C   Tobacco abuse   Foot pain, bilateral   Numbness and tingling in right hand   Nausea and vomiting   Diarrhea   Cough   Fecal occult blood test positive   Right flank pain   Medically noncompliant  [name] is a 53 yo man who was admitted for chest pain rule-out with a multitude of somatic complaints in the context of cocaine and THC use and depressed mood and sadness about being alone during  the holidays.  Bipolar Affective Disorder/ History of Anxiety/ History of Depression: Patient has been off psychiatric medications and having depression and thoughts of death w/o plan for suicide. Depression may be 2/2 substance abuse. - Appreciate psychiatry consult; to see patient today - Attempting to find records from [name]  - If pt becomes suicidal with a plan, will place on suicide precautions, call psych b/f the am and call the social worker in the ED to IVC pt   Atypical Chest Pain: Patient has a history of admission for chest pain following cocaine use at this time of year. Troponins negative. EKG with J-point elevation in 3 and AVF. Cards consulted (Dr. Katrinka Blazing) via phone in ED and no concerned for STEMI per ED physician. CTA negative for aortic dissection or other acute findings. Spoke with radiologist CT protocol was done for dissection and not PE (making it of poor quality for determining PE), but Radiologist reviewed CT chest and states it is negative for PE. Etiology could be MSK as it is reproducible on exam vs vasospasm as he is cocaine + vs GERD related, as patient has been off of his prilosec for many months and feels the worst pain on belching. Patient given aspirin 324, nitro x2, ativan x 2 mg total, zofran  and morphine 4 mg in the ED with no  relief. Patient appears to be more comfortable now that GI cocktail and protonix have been initiated (no longer asking for pain medication for his chest pain). - Continue Protonix - Attempting to get records from [facility] and [place])  Nausea, Vomiting, Diarrhea, Chills, Lightheadedness, Productive Cough: CXR with low lung volumes with vascular crowing and mild bibasilar opacities (likely atelectasis). Flu negative, pending C.diff  - Supportive treatment - Incentive Spirometry - PRN Mucinex DM  - PRN Zofran  - Pending orthostatics (reordered today)  FOBT+: No hemmorhoids externally with FOBT +. He does have diverticulosis noted on CT. CBC stable with hemoglobin 14.7. RN who observed BM reports that there was no blood.  Right Flank Pain: CTA abdomen/pelvis negative, UA negative. No clear etiology  - Continued to monitor  - Will try to avoid narcotics if possible  Resolved Right Arm Numbness: Odd history, with symptoms less severe with extra sleep. - CT head negative  - Continue to monitor  GERD: Chest pain with belching in context of noncompliance with Prilosec. - Reumed Protonix   History of Hypertension: BP high as 152/98 in ED. Currently 133/88. Pt was taking medication for HTN but noncompliant with meds outpatient and unsure which medications he was taking - Attempting to obtain records from [name] system to see what medications patient was taking  - Continue to monitor BP   Substance/ Tobacco Abuse: THC/cocaine + UDS. History of alcohol abuse. - Social work consulted for help, appreciate recs  - Smoking cessation counseling  History of Hepatitis C: Elevated AST/ALT. No evidence of cirrhosis on CT today. Hepatitis B surface ag negative, HCV ab reactive, Hep A IgM nonreactive, Hep B C IgM nonreactive.  - HCV RNA quant pending (will determine whether his hepatitis C is resolved or chronic)  History of Prediabetes: Prior A1c 6.3% in 03/2013. 6.4% now.  CBGs have been 84-106. No need for ISS now.  Chronic Back Pain -will try to avoid narcotics for now and avoid Tylenol due to elevated LFTs. -possibly try NSAIDS but r/o ACS 1st  F/E/N: - NSL - CMET in am  - Heart healthy/carb mod diet   DVT ppx:  -  Lovenox on hold due to blood per rectum - SCDs  Dispo: Disposition is deferred at this time, awaiting improvement of current medical problems.  Anticipated discharge in approximately 1-2 day(s).   The patient does not have a current PCP (Provider Default, MD) and does not need an [facility] hospital follow-up appointment after discharge.  The patient does not have transportation limitations that hinder transportation to clinic appointments.  .Services Needed at time of discharge: Y = Yes, Blank = No PT:   OT:   RN:   Equipment:   Other:     LOS: 1 day   Dionne Ano, MD 03/24/2014, 3:49 PM

## 2014-03-25 ENCOUNTER — Encounter (HOSPITAL_COMMUNITY): Payer: Self-pay | Admitting: Psychiatry

## 2014-03-25 DIAGNOSIS — F1910 Other psychoactive substance abuse, uncomplicated: Secondary | ICD-10-CM

## 2014-03-25 DIAGNOSIS — M25551 Pain in right hip: Secondary | ICD-10-CM | POA: Insufficient documentation

## 2014-03-25 DIAGNOSIS — R079 Chest pain, unspecified: Secondary | ICD-10-CM | POA: Diagnosis not present

## 2014-03-25 DIAGNOSIS — F3130 Bipolar disorder, current episode depressed, mild or moderate severity, unspecified: Secondary | ICD-10-CM

## 2014-03-25 DIAGNOSIS — F1410 Cocaine abuse, uncomplicated: Secondary | ICD-10-CM

## 2014-03-25 DIAGNOSIS — R45851 Suicidal ideations: Secondary | ICD-10-CM

## 2014-03-25 MED ORDER — HEPARIN SODIUM (PORCINE) 5000 UNIT/ML IJ SOLN
5000.0000 [IU] | Freq: Three times a day (TID) | INTRAMUSCULAR | Status: DC
  Administered 2014-03-25 – 2014-03-26 (×4): 5000 [IU] via SUBCUTANEOUS
  Filled 2014-03-25 (×4): qty 1

## 2014-03-25 NOTE — Progress Notes (Signed)
Pt c/o pain in abdomen and legs. Contacted Dr about pain medication, Dr stated " that I could give pt tramdol before scheduled time."

## 2014-03-25 NOTE — Progress Notes (Signed)
Internal Medicine Attending  Date: 03/25/2014  Patient name: [name] Medical record number: [id number] Date of birth: 10-22-1960 Age: 53 y.o. Gender: male  I saw and evaluated the patient, and discussed his care with resident on A.M rounds.  I reviewed the resident's note by Dr. Leatha Gilding and I agree with the resident's findings and plans as documented in her note, with the following additional comments.  Patient's chest pain has resolved; his main complaint is pain over his right hip, and exam shows some local tenderness over the right greater trochanter; he has no pain on passive internal/external rotation of the right hip.  Symptoms and exam suggest trochanteric bursitis (greater trochanteric pain syndrome); plan is symptomatic treatment, and consider referral to sports medicine clinic if pain persists.  Given suprapubic discomfort, will repeat urinalysis.  Psychiatry consult has been requested.

## 2014-03-25 NOTE — Consult Note (Signed)
[facility] Face-to-Face Psychiatry Consult   Reason for Consult:  Suicidal ideations Referring Physician:  Dr. Carmelina Peal is an 53 y.o. male. Total Time spent with patient: 45 minutes  Assessment: AXIS I:  Bipolar affective disorder, depression; polysubstance abuse; cocaine abuse AXIS II:  Deferred AXIS III:   Past Medical History  Diagnosis Date  . Arthritis     per pt rheumatoid   . Hypertension   . Anxiety   . Bipolar affective disorder   . Depression   . Alcohol abuse   . Cocaine abuse     smokes crack-cocaine  . GERD (gastroesophageal reflux disease)   . Chronic back pain   . Tobacco abuse   . Hepatitis C   . Vitamin D deficiency   . Gout   . Hemorrhoids   . Diabetes mellitus without complication    AXIS IV:  other psychosocial or environmental problems, problems related to social environment and problems with primary support group AXIS V:  21-30 behavior considerably influenced by delusions or hallucinations OR serious impairment in judgment, communication OR inability to function in almost all areas  Plan:  Recommend psychiatric Inpatient admission when medically cleared. Dr. Parke Poisson reviewed the patient and concurs with the plan.  Subjective:   ANVITH MAURIELLO is a 53 y.o. male patient admitted with suicidal ideations and plan to overdose.  HPI:  The patient stated he is having suicidal ideations and came to the ED for chest pain and see a psychiatrist.  He was also using cocaine and marijuana prior to admission, relapsed two weeks ago.  Karma is using crack/cocaine, alcohol, and marijuana every other day  Multiple rehabs and detox facilities in the past.  Guss endorses depression with crying spells and isolation.  He has had suicide attempts in the past by overdoses after his daughter died and later his fiance.  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  Alexxander reports not taking his medical and psychiatric medications for two months because he had been  feeling better but has not in the past two weeks.  He was on Abilify through the [place].  Denies homicidal ideations and hallucinations. HPI Elements:   Location:  generalized. Quality:  acute. Severity:  severe. Timing:  constant. Duration:  two weeks. Context:  stressors.  Past Psychiatric History: Past Medical History  Diagnosis Date  . Arthritis     per pt rheumatoid   . Hypertension   . Anxiety   . Bipolar affective disorder   . Depression   . Alcohol abuse   . Cocaine abuse     smokes crack-cocaine  . GERD (gastroesophageal reflux disease)   . Chronic back pain   . Tobacco abuse   . Hepatitis C   . Vitamin D deficiency   . Gout   . Hemorrhoids   . Diabetes mellitus without complication     reports that he has been smoking Cigarettes.  He has been smoking about 0.25 packs per day. He does not have any smokeless tobacco history on file. He reports that he drinks alcohol. He reports that he uses illicit drugs (Marijuana and Cocaine). Family History  Problem Relation Age of Onset  . Hypertension Brother   . Diabetes Brother   . Hypertension Mother   . Diabetes Mother   . Arthritis Mother   . Other Sister     bone disease     Living Arrangements: Alone   Abuse/Neglect [facility]) Physical Abuse: Denies  Verbal Abuse: Denies Sexual Abuse: Denies Allergies:  No Known Allergies  ACT Assessment Complete:  No:   Past Psychiatric History: Diagnosis:  Major depression, recurrent, severe; polysubstance dependence, cocaine abuse  Hospitalizations:  Multiple [facility] [place])  Outpatient Care:  [facility] in [facility]  Substance Abuse Care:  Multiple times  Self-Mutilation:  None  Suicidal Attempts:  Yes, overdoses  Homicidal Behaviors:  None   Violent Behaviors:  None   Place of Residence:  [place] Marital Status:  Single Employed/Unemployed:  Unemployed Education:  [employer] Family Supports:  Siblings Objective: Blood pressure 143/91, pulse 73, temperature 97.9 F (36.6 C),  temperature source Oral, resp. rate 19, height 5' 7" (1.702 m), weight 258 lb 3.2 oz (117.119 kg), SpO2 98 %.Body mass index is 40.43 kg/(m^2). Results for orders placed or performed during the hospital encounter of 03/23/14 (from the past 72 hour(s))  CBC with Differential     Status: None   Collection Time: 03/23/14  9:43 AM  Result Value Ref Range   WBC 6.8 4.0 - 10.5 K/uL   RBC 5.47 4.22 - 5.81 MIL/uL   Hemoglobin 15.5 13.0 - 17.0 g/dL   HCT 44.0 39.0 - 52.0 %   MCV 80.4 78.0 - 100.0 fL   MCH 28.3 26.0 - 34.0 pg   MCHC 35.2 30.0 - 36.0 g/dL   RDW 14.2 11.5 - 15.5 %   Platelets 233 150 - 400 K/uL   Neutrophils Relative % 69 43 - 77 %   Neutro Abs 4.7 1.7 - 7.7 K/uL   Lymphocytes Relative 22 12 - 46 %   Lymphs Abs 1.5 0.7 - 4.0 K/uL   Monocytes Relative 7 3 - 12 %   Monocytes Absolute 0.5 0.1 - 1.0 K/uL   Eosinophils Relative 2 0 - 5 %   Eosinophils Absolute 0.1 0.0 - 0.7 K/uL   Basophils Relative 0 0 - 1 %   Basophils Absolute 0.0 0.0 - 0.1 K/uL  Lipase, blood     Status: None   Collection Time: 03/23/14  9:43 AM  Result Value Ref Range   Lipase 39 11 - 59 U/L  Comprehensive metabolic panel     Status: Abnormal   Collection Time: 03/23/14  9:43 AM  Result Value Ref Range   Sodium 139 137 - 147 mEq/L   Potassium 3.5 (L) 3.7 - 5.3 mEq/L   Chloride 103 96 - 112 mEq/L   CO2 21 19 - 32 mEq/L   Glucose, Bld 94 70 - 99 mg/dL   BUN 8 6 - 23 mg/dL   Creatinine, Ser 0.99 0.50 - 1.35 mg/dL   Calcium 9.6 8.4 - 10.5 mg/dL   Total Protein 7.9 6.0 - 8.3 g/dL   Albumin 4.1 3.5 - 5.2 g/dL   AST 185 (H) 0 - 37 U/L   ALT 373 (H) 0 - 53 U/L   Alkaline Phosphatase 58 39 - 117 U/L   Total Bilirubin 0.6 0.3 - 1.2 mg/dL   GFR calc non Af Amer >90 >90 mL/min   GFR calc Af Amer >90 >90 mL/min    Comment: (NOTE) The eGFR has been calculated using the CKD EPI equation. This calculation has not been validated in all clinical situations. eGFR's persistently <90 mL/min signify possible Chronic  Kidney Disease.    Anion gap 15 5 - 15  Ethanol     Status: None   Collection Time: 03/23/14  9:43 AM  Result Value Ref Range   Alcohol, Ethyl (B) <11  0 - 11 mg/dL    Comment:        LOWEST DETECTABLE LIMIT FOR SERUM ALCOHOL IS 11 mg/dL FOR MEDICAL PURPOSES ONLY   I-stat troponin, ED     Status: None   Collection Time: 03/23/14  9:55 AM  Result Value Ref Range   Troponin i, poc 0.00 0.00 - 0.08 ng/mL   Comment 3            Comment: Due to the release kinetics of cTnI, a negative result within the first hours of the onset of symptoms does not rule out myocardial infarction with certainty. If myocardial infarction is still suspected, repeat the test at appropriate intervals.   I-Stat CG4 Lactic Acid, ED     Status: None   Collection Time: 03/23/14  9:57 AM  Result Value Ref Range   Lactic Acid, Venous 1.67 0.5 - 2.2 mmol/L  I-Stat Chem 8, ED     Status: Abnormal   Collection Time: 03/23/14  9:57 AM  Result Value Ref Range   Sodium 140 137 - 147 mEq/L   Potassium 3.3 (L) 3.7 - 5.3 mEq/L   Chloride 105 96 - 112 mEq/L   BUN 7 6 - 23 mg/dL   Creatinine, Ser 1.10 0.50 - 1.35 mg/dL   Glucose, Bld 96 70 - 99 mg/dL   Calcium, Ion 1.13 1.12 - 1.23 mmol/L   TCO2 20 0 - 100 mmol/L   Hemoglobin 16.7 13.0 - 17.0 g/dL   HCT 49.0 39.0 - 52.0 %  POC occult blood, ED Provider will collect     Status: Abnormal   Collection Time: 03/23/14  9:58 AM  Result Value Ref Range   Fecal Occult Bld POSITIVE (A) NEGATIVE  Urine rapid drug screen (hosp performed)     Status: Abnormal   Collection Time: 03/23/14 12:45 PM  Result Value Ref Range   Opiates NONE DETECTED NONE DETECTED   Cocaine POSITIVE (A) NONE DETECTED   Benzodiazepines NONE DETECTED NONE DETECTED   Amphetamines NONE DETECTED NONE DETECTED   Tetrahydrocannabinol POSITIVE (A) NONE DETECTED   Barbiturates NONE DETECTED NONE DETECTED    Comment:        DRUG SCREEN FOR MEDICAL PURPOSES ONLY.  IF CONFIRMATION IS NEEDED FOR ANY  PURPOSE, NOTIFY LAB WITHIN 5 DAYS.        LOWEST DETECTABLE LIMITS FOR URINE DRUG SCREEN Drug Class       Cutoff (ng/mL) Amphetamine      1000 Barbiturate      200 Benzodiazepine   709 Tricyclics       628 Opiates          300 Cocaine          300 THC              50   Urinalysis, Routine w reflex microscopic     Status: Abnormal   Collection Time: 03/23/14 12:45 PM  Result Value Ref Range   Color, Urine YELLOW YELLOW   APPearance CLEAR CLEAR   Specific Gravity, Urine 1.038 (H) 1.005 - 1.030   pH 7.0 5.0 - 8.0   Glucose, UA NEGATIVE NEGATIVE mg/dL   Hgb urine dipstick NEGATIVE NEGATIVE   Bilirubin Urine NEGATIVE NEGATIVE   Ketones, ur NEGATIVE NEGATIVE mg/dL   Protein, ur NEGATIVE NEGATIVE mg/dL   Urobilinogen, UA 1.0 0.0 - 1.0 mg/dL   Nitrite NEGATIVE NEGATIVE   Leukocytes, UA NEGATIVE NEGATIVE    Comment: MICROSCOPIC NOT DONE ON URINES WITH NEGATIVE PROTEIN,  BLOOD, LEUKOCYTES, NITRITE, OR GLUCOSE <1000 mg/dL.  Troponin I     Status: None   Collection Time: 03/23/14  1:35 PM  Result Value Ref Range   Troponin I <0.30 <0.30 ng/mL    Comment:        Due to the release kinetics of cTnI, a negative result within the first hours of the onset of symptoms does not rule out myocardial infarction with certainty. If myocardial infarction is still suspected, repeat the test at appropriate intervals.   Magnesium     Status: None   Collection Time: 03/23/14  3:32 PM  Result Value Ref Range   Magnesium 1.9 1.5 - 2.5 mg/dL  Hepatitis panel, acute     Status: Abnormal   Collection Time: 03/23/14  3:32 PM  Result Value Ref Range   Hepatitis B Surface Ag NEGATIVE NEGATIVE   HCV Ab Reactive (A) NEGATIVE   Hep A IgM NON REACTIVE NON REACTIVE    Comment: (NOTE) Effective February 23, 2014, Hepatitis Acute Panel (test code [phone]) will be revised to automatically reflex to the Hepatitis C Viral RNA, Quantitative, Real-Time PCR assay if the Hepatitis C antibody screening result is  Reactive. This action is being taken to ensure that the CDC/USPSTF recommended HCV diagnostic algorithm with the appropriate test reflex needed for accurate interpretation is followed.    Hep B C IgM NON REACTIVE NON REACTIVE    Comment: (NOTE) High levels of Hepatitis B Core IgM antibody are detectable during the acute stage of Hepatitis B. This antibody is used to differentiate current from past HBV infection. Performed at [place] [facility] rflx ultra or genotyp     Status: Abnormal   Collection Time: 03/23/14  3:32 PM  Result Value Ref Range   HCV Quantitative 753765 (H) <15 IU/mL   HCV Quantitative Log 5.88 (H) <1.18 log 10    Comment: (NOTE) This test utilizes the [place] FDA approved Roche HCV Test Kit by RT-PCR. Performed at [facility]   HIV antibody     Status: None   Collection Time: 03/23/14  3:32 PM  Result Value Ref Range   HIV 1&2 Ab, 4th Generation NONREACTIVE NONREACTIVE    Comment: (NOTE) A NONREACTIVE HIV Ag/Ab result does not exclude HIV infection since the time frame for seroconversion is variable. If acute HIV infection is suspected, a HIV-1 RNA Qualitative TMA test is recommended. HIV-1/2 Antibody Diff         Not indicated. HIV-1 RNA, Qual TMA           Not indicated. PLEASE NOTE: This information has been disclosed to you from records whose confidentiality may be protected by state law. If your state requires such protection, then the state law prohibits you from making any further disclosure of the information without the specific written consent of the person to whom it pertains, or as otherwise permitted by law. A general authorization for the release of medical or other information is NOT sufficient for this purpose. The performance of this assay has not been clinically validated in patients less than 23 years old. Performed at [facility]   Influenza panel by PCR (type A & B, H1N1)     Status: None   Collection  Time: 03/23/14  4:54 PM  Result Value Ref Range   Influenza A By PCR NEGATIVE NEGATIVE   Influenza B By PCR NEGATIVE NEGATIVE   H1N1 flu by pcr NOT DETECTED NOT DETECTED  Comment:        The Xpert Flu assay (FDA approved for nasal aspirates or washes and nasopharyngeal swab specimens), is intended as an aid in the diagnosis of influenza and should not be used as a sole basis for treatment.   MRSA PCR Screening     Status: None   Collection Time: 03/23/14  5:34 PM  Result Value Ref Range   MRSA by PCR NEGATIVE NEGATIVE    Comment:        The GeneXpert MRSA Assay (FDA approved for NASAL specimens only), is one component of a comprehensive MRSA colonization surveillance program. It is not intended to diagnose MRSA infection nor to guide or monitor treatment for MRSA infections.   Lipid panel     Status: Abnormal   Collection Time: 03/23/14  6:13 PM  Result Value Ref Range   Cholesterol 154 0 - 200 mg/dL   Triglycerides 126 <150 mg/dL   HDL 32 (L) >39 mg/dL   Total CHOL/HDL Ratio 4.8 RATIO   VLDL 25 0 - 40 mg/dL   LDL Cholesterol 97 0 - 99 mg/dL    Comment:        Total Cholesterol/HDL:CHD Risk Coronary Heart Disease Risk Table                     Men   Women  1/2 Average Risk   3.4   3.3  Average Risk       5.0   4.4  2 X Average Risk   9.6   7.1  3 X Average Risk  23.4   11.0        Use the calculated Patient Ratio above and the CHD Risk Table to determine the patient's CHD Risk.        ATP III CLASSIFICATION (LDL):  <100     mg/dL   Optimal  100-129  mg/dL   Near or Above                    Optimal  130-159  mg/dL   Borderline  160-189  mg/dL   High  >190     mg/dL   Very High   Troponin I (q 6hr x 3)     Status: None   Collection Time: 03/23/14  7:35 PM  Result Value Ref Range   Troponin I <0.30 <0.30 ng/mL    Comment:        Due to the release kinetics of cTnI, a negative result within the first hours of the onset of symptoms does not rule  out myocardial infarction with certainty. If myocardial infarction is still suspected, repeat the test at appropriate intervals.   Hemoglobin A1c     Status: Abnormal   Collection Time: 03/23/14  7:35 PM  Result Value Ref Range   Hgb A1c MFr Bld 6.4 (H) <5.7 %    Comment: (NOTE)                                                                       According to the ADA Clinical Practice Recommendations for 2011, when HbA1c is used as a screening test:  >=6.5%   Diagnostic of Diabetes Mellitus           (  if abnormal result is confirmed) 5.7-6.4%   Increased risk of developing Diabetes Mellitus References:Diagnosis and Classification of Diabetes Mellitus,Diabetes HTDS,2876,81(LXBWI 1):S62-S69 and Standards of Medical Care in         Diabetes - 2011,Diabetes OMBT,5974,16 (Suppl 1):S11-S61.    Mean Plasma Glucose 137 (H) <117 mg/dL    Comment: Performed at [facility]  Troponin I (q 6hr x 3)     Status: None   Collection Time: 03/24/14  1:03 AM  Result Value Ref Range   Troponin I <0.30 <0.30 ng/mL    Comment:        Due to the release kinetics of cTnI, a negative result within the first hours of the onset of symptoms does not rule out myocardial infarction with certainty. If myocardial infarction is still suspected, repeat the test at appropriate intervals.   Comprehensive metabolic panel     Status: Abnormal   Collection Time: 03/24/14  1:03 AM  Result Value Ref Range   Sodium 137 137 - 147 mEq/L   Potassium 3.7 3.7 - 5.3 mEq/L   Chloride 102 96 - 112 mEq/L   CO2 20 19 - 32 mEq/L   Glucose, Bld 90 70 - 99 mg/dL   BUN 9 6 - 23 mg/dL   Creatinine, Ser 1.02 0.50 - 1.35 mg/dL   Calcium 9.3 8.4 - 10.5 mg/dL   Total Protein 7.4 6.0 - 8.3 g/dL   Albumin 3.8 3.5 - 5.2 g/dL   AST 160 (H) 0 - 37 U/L   ALT 356 (H) 0 - 53 U/L   Alkaline Phosphatase 55 39 - 117 U/L   Total Bilirubin 0.4 0.3 - 1.2 mg/dL   GFR calc non Af Amer 82 (L) >90 mL/min   GFR calc Af Amer >90 >90  mL/min    Comment: (NOTE) The eGFR has been calculated using the CKD EPI equation. This calculation has not been validated in all clinical situations. eGFR's persistently <90 mL/min signify possible Chronic Kidney Disease.    Anion gap 15 5 - 15  CBC with Differential     Status: None   Collection Time: 03/24/14  1:03 AM  Result Value Ref Range   WBC 7.9 4.0 - 10.5 K/uL   RBC 5.28 4.22 - 5.81 MIL/uL   Hemoglobin 14.7 13.0 - 17.0 g/dL   HCT 42.3 39.0 - 52.0 %   MCV 80.1 78.0 - 100.0 fL   MCH 27.8 26.0 - 34.0 pg   MCHC 34.8 30.0 - 36.0 g/dL   RDW 14.3 11.5 - 15.5 %   Platelets 260 150 - 400 K/uL   Neutrophils Relative % 60 43 - 77 %   Neutro Abs 4.8 1.7 - 7.7 K/uL   Lymphocytes Relative 30 12 - 46 %   Lymphs Abs 2.3 0.7 - 4.0 K/uL   Monocytes Relative 8 3 - 12 %   Monocytes Absolute 0.6 0.1 - 1.0 K/uL   Eosinophils Relative 2 0 - 5 %   Eosinophils Absolute 0.2 0.0 - 0.7 K/uL   Basophils Relative 0 0 - 1 %   Basophils Absolute 0.0 0.0 - 0.1 K/uL  Clostridium Difficile by PCR     Status: None   Collection Time: 03/24/14  4:06 PM  Result Value Ref Range   C difficile by pcr NEGATIVE NEGATIVE   Labs are reviewed and are pertinent for medical issues being addressed.  Current Facility-Administered Medications  Medication Dose Route Frequency Provider Last Rate Last Dose  . dextromethorphan-guaiFENesin [facility]  DM) 30-600 MG per 12 hr tablet 1 tablet  1 tablet Oral BID PRN Cresenciano Genre, MD      . heparin injection 5,000 Units  5,000 Units Subcutaneous 3 times per day Drucilla Schmidt, MD   5,000 Units at 03/25/14 1728  . ondansetron (ZOFRAN) injection 4 mg  4 mg Intravenous Q6H PRN Cresenciano Genre, MD   4 mg at 03/23/14 2202  . pantoprazole (PROTONIX) EC tablet 40 mg  40 mg Oral Daily Cresenciano Genre, MD   40 mg at 03/25/14 0941  . sodium chloride 0.9 % injection 3 mL  3 mL Intravenous Q12H Cresenciano Genre, MD   3 mL at 03/25/14 [phone]  . traMADol (ULTRAM) tablet 50 mg  50 mg Oral 4  times per day Jones Bales, MD   50 mg at 03/25/14 1728    Psychiatric Specialty Exam:     Blood pressure 143/91, pulse 73, temperature 97.9 F (36.6 C), temperature source Oral, resp. rate 19, height 5' 7" (1.702 m), weight 258 lb 3.2 oz (117.119 kg), SpO2 98 %.Body mass index is 40.43 kg/(m^2).  General Appearance: Casual  Eye Contact::  Fair  Speech:  Normal Rate  Volume:  Normal  Mood:  Depressed  Affect:  Congruent  Thought Process:  Coherent  Orientation:  Full (Time, Place, and Person)  Thought Content:  Rumination  Suicidal Thoughts:  Yes.  with intent/plan  Homicidal Thoughts:  No  Memory:  Immediate;   Fair Recent;   Fair Remote;   Fair  Judgement:  Fair  Insight:  Fair  Psychomotor Activity:  Decreased  Concentration:  Fair  Recall:  [employer]: Fair  Akathisia:  No  Handed:  Right  AIMS (if indicated):     Assets:  Catering manager Housing Leisure Time Physical Health Resilience Social Support  Sleep:      Musculoskeletal: Strength & Muscle Tone: within normal limits Gait & Station: normal Patient leans: N/A  Treatment Plan Summary: Daily contact with patient to assess and evaluate symptoms and progress in treatment Medication management; admit to inpatient psychiatry for stabilization  Waylan Boga, PMH-NP 03/25/2014 9:04 PM   Case Asencion Noble Discussed with me as above

## 2014-03-25 NOTE — Progress Notes (Signed)
Subjective: Patient would like to speak with a psychiatrist. He continues to complain of pain in his left flank, worse while walking. He also has generalized abdominal pain and "does not feel well".  Objective: Vital signs in last 24 hours: Filed Vitals:   03/24/14 0554 03/24/14 1456 03/24/14 2100 03/25/14 0531  BP: 116/70 133/88 130/99 128/93  Pulse: 72 73 73 67  Temp: 97.7 F (36.5 C) 98.2 F (36.8 C) 98.2 F (36.8 C) 98.1 F (36.7 C)  TempSrc: Oral Oral Oral Oral  Resp: Height:      Weight: 257 lb 15 oz (117 kg)   258 lb 3.2 oz (117.119 kg)  SpO2: 96% 98% 98% 98%   Weight change: 14.8 oz (0.419 kg)  Intake/Output Summary (Last 24 hours) at 03/25/14 1610 Last data filed at 03/25/14 0500  Gross per 24 hour  Intake   1080 ml  Output    700 ml  Net    380 ml   Physical Exam: Appearance: in NAD, lying in bed; has eaten about half of breakfast. Sitter at bedside. HEENT: AT/[place], PERRL, EOMi, no lymphadenopathy Heart: RRR, normal S1S2, tenderness to substernal palpation Lungs: CTAB, no wheezes Abdomen: BS+, soft, diffusely tender, no organomegaly, no visible external hemorrhoids on admission exam Msk: tenderness to deep palpation superior right hip Extremities: no edema b/l  Neurologic: A&Ox3, strength and sensation intact throughout, coordination intact Psychiatric: appears anxious, states that he has depressed mood, thoughts of death, no plan for suicide or hurting self, no HI, but worried about being home alone, occasionally teary  Lab Results: Basic Metabolic Panel:  Recent Labs Lab 03/23/14 0943 03/23/14 0957 03/23/14 1532 03/24/14 0103  NA 139 140  --  137  K 3.5* 3.3*  --  3.7  CL 103 105  --  102  CO2 21  --   --  20  GLUCOSE 94 96  --  90  BUN 8 7  --  9  CREATININE 0.99 1.10  --  1.02  CALCIUM 9.6  --   --  9.3  MG  --   --  1.9  --    Liver Function Tests:  Recent Labs Lab 03/23/14 0943 03/24/14 0103  AST 185* 160*  ALT 373*  356*  ALKPHOS 58 55  BILITOT 0.6 0.4  PROT 7.9 7.4  ALBUMIN 4.1 3.8    Recent Labs Lab 03/23/14 0943  LIPASE 39   CBC:  Recent Labs Lab 03/23/14 0943 03/23/14 0957 03/24/14 0103  WBC 6.8  --  7.9  NEUTROABS 4.7  --  4.8  HGB 15.5 16.7 14.7  HCT 44.0 49.0 42.3  MCV 80.4  --  80.1  PLT 233  --  260   Cardiac Enzymes:  Recent Labs Lab 03/23/14 1335 03/23/14 1935 03/24/14 0103  TROPONINI <0.30 <0.30 <0.30   Hemoglobin A1C:  Recent Labs Lab 03/23/14 1935  HGBA1C 6.4*   Fasting Lipid Panel:  Recent Labs Lab 03/23/14 1813  CHOL 154  HDL 32*  LDLCALC 97  TRIG 960  CHOLHDL 4.8   Urine Drug Screen: Drugs of Abuse     Component Value Date/Time   LABOPIA NONE DETECTED 03/23/2014 1245   COCAINSCRNUR POSITIVE* 03/23/2014 1245   LABBENZ NONE DETECTED 03/23/2014 1245   AMPHETMU NONE DETECTED 03/23/2014 1245   THCU POSITIVE* 03/23/2014 1245   LABBARB NONE DETECTED 03/23/2014 1245    Alcohol Level:  Recent Labs Lab 03/23/14 0943  ETH <11  Urinalysis:  Recent Labs Lab 03/23/14 1245  COLORURINE YELLOW  LABSPEC 1.038*  PHURINE 7.0  GLUCOSEU NEGATIVE  HGBUR NEGATIVE  BILIRUBINUR NEGATIVE  KETONESUR NEGATIVE  PROTEINUR NEGATIVE  UROBILINOGEN 1.0  NITRITE NEGATIVE  LEUKOCYTESUR NEGATIVE   Micro Results: Recent Results (from the past 240 hour(s))  MRSA PCR Screening     Status: None   Collection Time: 03/23/14  5:34 PM  Result Value Ref Range Status   MRSA by PCR NEGATIVE NEGATIVE Final    Comment:        The GeneXpert MRSA Assay (FDA approved for NASAL specimens only), is one component of a comprehensive MRSA colonization surveillance program. It is not intended to diagnose MRSA infection nor to guide or monitor treatment for MRSA infections.   Clostridium Difficile by PCR     Status: None   Collection Time: 03/24/14  4:06 PM  Result Value Ref Range Status   C difficile by pcr NEGATIVE NEGATIVE Final   Studies/Results: Ct Head Wo  Contrast  03/23/2014   CLINICAL DATA:  Initial encounter for 3 a history of nausea with vomiting and diarrhea +chills.  EXAM: CT HEAD WITHOUT CONTRAST  TECHNIQUE: Contiguous axial images were obtained from the base of the skull through the vertex without intravenous contrast.  COMPARISON:  None.  FINDINGS: There is no evidence for acute hemorrhage, hydrocephalus, mass lesion, or abnormal extra-axial fluid collection. No definite CT evidence for acute infarction. Diffuse loss of parenchymal volume is consistent with atrophy. The visualized paranasal sinuses and mastoid air cells are clear.  IMPRESSION: Very mild diffuse atrophy. Otherwise normal exam which is unchanged in the interval.   Electronically Signed   By: Kennith Center M.D.   On: 03/23/2014 12:42   Dg Chest Portable 1 View  03/23/2014   CLINICAL DATA:  Chest pain, cough, shortness of breath  EXAM: PORTABLE CHEST - 1 VIEW  COMPARISON:  03/22/2013  FINDINGS: Low lung volumes with vascular crowding. Mild bibasilar opacities, likely atelectasis. No pleural effusion or pneumothorax.  Cardiomegaly.  IMPRESSION: Low lung volumes with vascular crowding.  Mild bibasilar opacities, likely atelectasis.   Electronically Signed   By: Charline Bills M.D.   On: 03/23/2014 10:10   Ct Angio Chest Aorta W/cm &/or Wo/cm  03/23/2014   CLINICAL DATA:  Initial encounter for substernal chest pain radiates to the back. Pain began 2 days ago.  EXAM: CT ANGIOGRAPHY CHEST, ABDOMEN AND PELVIS  TECHNIQUE: Multidetector CT imaging through the chest, abdomen and pelvis was performed using the standard protocol during bolus administration of intravenous contrast. Multiplanar reconstructed images and MIPs were obtained and reviewed to evaluate the vascular anatomy.  CONTRAST:  OMNIPAQUE IOHEXOL 350 MG/ML SOLN  COMPARISON:  CT abdomen and pelvis from 07/13/2012. CT chest from 03/22/2013.  FINDINGS: CTA CHEST FINDINGS  Soft tissue / Mediastinum: Pre contrast imaging shows  no hyperdense crescent in the wall of the thoracic aorta to suggest acute intramural hematoma. Imaging after IV contrast administration shows no evidence for dissection the thoracic aorta. No evidence for penetrating ulcer or or ulcerated plaque. Bovine arch vessel anatomy is evident with major arch vessels appearing widely patent.  There is no axillary lymphadenopathy. No mediastinal or hilar lymphadenopathy. Heart size is normal. No pericardial effusion. Thoracic esophagus is unremarkable.  Lungs / Pleura: Lung volumes are low and fine architectural detail is obscured by breathing motion during image acquisition. Ground-glass attenuation the dependent lower lobes and lung bases is probably secondary to compressive atelectasis. No dense  focal airspace consolidation. No evidence for pulmonary parenchymal nodule or mass.  Bones: Bone windows reveal no worrisome lytic or sclerotic osseous lesions.  Review of the MIP images confirms the above findings.  CTA ABDOMEN AND PELVIS FINDINGS  Hepatobiliary: Insert normal Liver There is no evidence for gallstones, gallbladder wall thickening, or pericholecystic fluid. No intrahepatic or extrahepatic biliary dilation.  Pancreas: No focal mass lesion. No dilatation of the main duct. No intraparenchymal cyst. No peripancreatic edema.  Spleen: No splenomegaly. No focal mass lesion.  Adrenals/Urinary Tract: The right adrenal gland is normal. A tiny calcification is seen in the left adrenal gland without a nodule or mass. This could be related to prior inflammation or hemorrhage. Small cortical cysts are noted in both kidneys. The ureters are normal bilaterally. There is some early excreted contrast visible in the bladder lumen.  Stomach/Bowel: Stomach is nondistended. No gastric wall thickening. No evidence of outlet obstruction. Duodenum is normally positioned as is the ligament of Treitz. No small bowel wall thickening. No small bowel dilatation. Terminal ileum is normal. The  appendix is normal. Diverticular changes seen in the left colon without diverticulitis.  Vascular/Lymphatic: There is no abdominal aortic aneurysm. There is no dissection of the abdominal aorta. Mild atherosclerotic calcification/ plaque is seen in the infrarenal aorta. The celiac axis is widely patent with normal branching configuration. The superior mesenteric artery is widely patent. Inferior mesenteric artery is widely patent. A single right renal artery is widely patent. Main left renal artery is widely patent and there is an accessory left renal artery supplying the posterior left upper renal pole.  No gastrohepatic lymphadenopathy. Upper normal size lymph nodes are seen in the hepatoduodenal ligament. There is no retroperitoneal lymphadenopathy. No pelvic sidewall lymphadenopathy.  Reproductive: Prostate gland and seminal vesicles are normal by CT imaging.  Other: No intraperitoneal free fluid.  Musculoskeletal: Bone windows reveal no worrisome lytic or sclerotic osseous lesions. Advanced degenerative disc disease is seen at L4-5.  Review of the MIP images confirms the above findings.  IMPRESSION: 1. No thoracoabdominal aortic aneurysm. No evidence for acute intramural hematoma within the thoracic aorta. No dissection of the thoracoabdominal aorta. 2. No acute findings in the chest. Specifically, no findings to explain the patient's history of substernal chest pain. 3. No acute findings in the abdomen or pelvis.   Electronically Signed   By: Kennith Center M.D.   On: 03/23/2014 12:54   Ct Cta Abd/pel W/cm &/or W/o Cm  03/23/2014   CLINICAL DATA:  Initial encounter for substernal chest pain radiates to the back. Pain began 2 days ago.  EXAM: CT ANGIOGRAPHY CHEST, ABDOMEN AND PELVIS  TECHNIQUE: Multidetector CT imaging through the chest, abdomen and pelvis was performed using the standard protocol during bolus administration of intravenous contrast. Multiplanar reconstructed images and MIPs were obtained and  reviewed to evaluate the vascular anatomy.  CONTRAST:  OMNIPAQUE IOHEXOL 350 MG/ML SOLN  COMPARISON:  CT abdomen and pelvis from 07/13/2012. CT chest from 03/22/2013.  FINDINGS: CTA CHEST FINDINGS  Soft tissue / Mediastinum: Pre contrast imaging shows no hyperdense crescent in the wall of the thoracic aorta to suggest acute intramural hematoma. Imaging after IV contrast administration shows no evidence for dissection the thoracic aorta. No evidence for penetrating ulcer or or ulcerated plaque. Bovine arch vessel anatomy is evident with major arch vessels appearing widely patent.  There is no axillary lymphadenopathy. No mediastinal or hilar lymphadenopathy. Heart size is normal. No pericardial effusion. Thoracic esophagus is unremarkable.  Lungs / Pleura: Lung volumes are low and fine architectural detail is obscured by breathing motion during image acquisition. Ground-glass attenuation the dependent lower lobes and lung bases is probably secondary to compressive atelectasis. No dense focal airspace consolidation. No evidence for pulmonary parenchymal nodule or mass.  Bones: Bone windows reveal no worrisome lytic or sclerotic osseous lesions.  Review of the MIP images confirms the above findings.  CTA ABDOMEN AND PELVIS FINDINGS  Hepatobiliary: Insert normal Liver There is no evidence for gallstones, gallbladder wall thickening, or pericholecystic fluid. No intrahepatic or extrahepatic biliary dilation.  Pancreas: No focal mass lesion. No dilatation of the main duct. No intraparenchymal cyst. No peripancreatic edema.  Spleen: No splenomegaly. No focal mass lesion.  Adrenals/Urinary Tract: The right adrenal gland is normal. A tiny calcification is seen in the left adrenal gland without a nodule or mass. This could be related to prior inflammation or hemorrhage. Small cortical cysts are noted in both kidneys. The ureters are normal bilaterally. There is some early excreted contrast visible in the bladder lumen.   Stomach/Bowel: Stomach is nondistended. No gastric wall thickening. No evidence of outlet obstruction. Duodenum is normally positioned as is the ligament of Treitz. No small bowel wall thickening. No small bowel dilatation. Terminal ileum is normal. The appendix is normal. Diverticular changes seen in the left colon without diverticulitis.  Vascular/Lymphatic: There is no abdominal aortic aneurysm. There is no dissection of the abdominal aorta. Mild atherosclerotic calcification/ plaque is seen in the infrarenal aorta. The celiac axis is widely patent with normal branching configuration. The superior mesenteric artery is widely patent. Inferior mesenteric artery is widely patent. A single right renal artery is widely patent. Main left renal artery is widely patent and there is an accessory left renal artery supplying the posterior left upper renal pole.  No gastrohepatic lymphadenopathy. Upper normal size lymph nodes are seen in the hepatoduodenal ligament. There is no retroperitoneal lymphadenopathy. No pelvic sidewall lymphadenopathy.  Reproductive: Prostate gland and seminal vesicles are normal by CT imaging.  Other: No intraperitoneal free fluid.  Musculoskeletal: Bone windows reveal no worrisome lytic or sclerotic osseous lesions. Advanced degenerative disc disease is seen at L4-5.  Review of the MIP images confirms the above findings.  IMPRESSION: 1. No thoracoabdominal aortic aneurysm. No evidence for acute intramural hematoma within the thoracic aorta. No dissection of the thoracoabdominal aorta. 2. No acute findings in the chest. Specifically, no findings to explain the patient's history of substernal chest pain. 3. No acute findings in the abdomen or pelvis.   Electronically Signed   By: Kennith Center M.D.   On: 03/23/2014 12:54   Medications: I have reviewed the patient's current medications. Scheduled Meds: . pantoprazole  40 mg Oral Daily  . sodium chloride  3 mL Intravenous Q12H  . traMADol  50  mg Oral 4 times per day   Continuous Infusions:  PRN Meds:.dextromethorphan-guaiFENesin, ondansetron (ZOFRAN) IV Assessment/Plan: Principal Problem:   Chest pain Active Problems:   Anxiety   Bipolar affective disorder   Depression   Alcohol abuse   Polysubstance abuse   GERD (gastroesophageal reflux disease)   Chronic back pain   Hepatitis C   Tobacco abuse   Foot pain, bilateral   Numbness and tingling in right hand   Nausea and vomiting   Diarrhea   Cough   Fecal occult blood test positive   Right flank pain   Medically noncompliant  Mr. Siegman is a 53 yo man who was admitted for chest  pain rule-out with a multitude of somatic complaints in the context of cocaine and THC use, depressed mood and sadness about being alone during the holidays.  Bipolar Affective Disorder/ History of Anxiety/ History of Depression: Patient has been off psychiatric medications and having depression and thoughts of death without a plan for suicide. Depression may be 2/2 substance abuse. - Appreciate psychiatry consult; unfortunately, no note left by psychiatry yesterday. Requesting again today - Will need to resume anti-depressive medication; appreciate psychiatry recommendations, patient previously on Abilify  - Suicide precautions and sitter now in place  Resolved Atypical Chest Pain: Patient has a history of admission for chest pain following cocaine use at this time of year. Troponins negative. EKG with J-point elevation in 3 and AVF. Cards consulted (Dr. Katrinka Blazing) via phone in ED and no concerned for STEMI per ED physician. CTA negative for aortic dissection or other acute findings. Spoke with radiologist CT protocol was done for dissection and not PE (making it of poor quality for determining PE), but Radiologist reviewed CT chest and states it is negative for PE. Etiology could be MSK as it is reproducible on exam vs vasospasm as he is cocaine + vs GERD related, as patient has been off of his  prilosec for many months and feels the worst pain on belching. Patient given aspirin 324, nitro x2, ativan x 2 mg total, zofran 4mg  and morphine 4 mg in the ED with no relief. Patient appears to be more comfortable now that GI cocktail and protonix have been initiated. No further chest pain. - Continue Protonix  Right Hip Pain: CTA abdomen/pelvis negative, UA negative x2. Appears to be musculoskeletal given worsening with walking. May be greater trochanter bursitis?  - Continue to monitor  - Avoiding narcotics, managing pain with tramadol while inpatient - U/A pending  Resolved Nausea, Vomiting, Diarrhea, Chills, Lightheadedness, Productive Cough: CXR with low lung volumes with vascular crowing and mild bibasilar opacities (likely atelectasis). Flu negative, pending C.diff.  - Supportive treatment - Incentive Spirometry - PRN Mucinex DM  - PRN Zofran   FOBT+: No hemmorhoids externally with FOBT +. He does have diverticulosis noted on CT. CBC stable with hemoglobin 14.7. RN who observed BM reports that there was no blood.  Resolved Right Arm Numbness: Odd history, with symptoms less severe with extra sleep. - CT head negative  - Continue to monitor  History of GERD: Chest pain (see above) with belching in context of noncompliance with Prilosec, may have been due to GERD. - Reumed Protonix  - No further complaints of pain to RN  History of Hypertension: BP high as 152/98 in ED. Currently 150/86. Pt was taking medication for HTN but noncompliant with meds outpatient and unsure which medications he was taking - Continue to monitor BP - May need to be sent out on anti-hypertensive medication   Substance/ Tobacco Abuse: THC/cocaine + UDS. History of alcohol abuse. - Social work consulted for help, appreciate recs  - Smoking cessation counseling  History of Hepatitis C: Elevated AST/ALT. No evidence of cirrhosis on CT today. Hepatitis B surface ag negative, HCV ab reactive, Hep A IgM  nonreactive, Hep B C IgM nonreactive. HCV RNA quant with a quantitative log 5.88, indicative of chronic(not resolved) hepatitis C. Patient does not have cirrhosis on CT/CTA abdomen/pelvis. - Alcohol cessation counseling - Consider referral to ID clinic for discussion on initiating antiviral therapy  History of Prediabetes: Prior A1c 6.3% in 03/2013. 6.4% now. CBGs have been 84-106. No need for  ISS now.  F/E/N: - NSL - Heart healthy/carb mod diet   DVT ppx:  - [place] heparin  - SCDs  Dispo: Disposition is deferred at this time, awaiting improvement of current medical problems.  Anticipated discharge in approximately 0 day(s).  Have received multiple calls about patient not meeting qualifications for hospitalization, but awaiting psychiatry consult.  The patient does not have a current PCP (Provider Default, MD) and does not need an [facility] hospital follow-up appointment after discharge.  The patient does not have transportation limitations that hinder transportation to clinic appointments.  .Services Needed at time of discharge: Y = Yes, Blank = No PT:   OT:   RN:   Equipment:   Other:     LOS: 2 days   Dionne Ano, MD 03/25/2014, 7:27 AM

## 2014-03-25 NOTE — Care Management Note (Signed)
03-25-14 1120 CM spoke to CSW Almira Coaster in ref to psych consult- CM did call [facility] and spoke to [place] in ref to pt being seen today by psych MD. Per Inetta Fermo pt will be seen today by Nanine Means. No further needs at this time from CM. Thanks Gala Lewandowsky , RN, BSN [phone]

## 2014-03-25 NOTE — Progress Notes (Signed)
UR completed 

## 2014-03-26 ENCOUNTER — Encounter (HOSPITAL_COMMUNITY): Payer: Self-pay | Admitting: *Deleted

## 2014-03-26 ENCOUNTER — Inpatient Hospital Stay (HOSPITAL_COMMUNITY)
Admission: AD | Admit: 2014-03-26 | Discharge: 2014-04-06 | DRG: 885 | Disposition: A | Discharge status: Home / Self Care | Payer: 59 | Source: Intra-hospital | Attending: Psychiatry | Admitting: Psychiatry

## 2014-03-26 DIAGNOSIS — R109 Unspecified abdominal pain: Secondary | ICD-10-CM | POA: Insufficient documentation

## 2014-03-26 DIAGNOSIS — I10 Essential (primary) hypertension: Secondary | ICD-10-CM | POA: Diagnosis present

## 2014-03-26 DIAGNOSIS — F419 Anxiety disorder, unspecified: Secondary | ICD-10-CM | POA: Diagnosis present

## 2014-03-26 DIAGNOSIS — R1084 Generalized abdominal pain: Secondary | ICD-10-CM | POA: Insufficient documentation

## 2014-03-26 DIAGNOSIS — R112 Nausea with vomiting, unspecified: Secondary | ICD-10-CM | POA: Diagnosis present

## 2014-03-26 DIAGNOSIS — F314 Bipolar disorder, current episode depressed, severe, without psychotic features: Principal | ICD-10-CM | POA: Diagnosis present

## 2014-03-26 DIAGNOSIS — F1420 Cocaine dependence, uncomplicated: Secondary | ICD-10-CM | POA: Diagnosis present

## 2014-03-26 DIAGNOSIS — F1910 Other psychoactive substance abuse, uncomplicated: Secondary | ICD-10-CM | POA: Diagnosis present

## 2014-03-26 DIAGNOSIS — R079 Chest pain, unspecified: Secondary | ICD-10-CM | POA: Diagnosis not present

## 2014-03-26 DIAGNOSIS — B182 Chronic viral hepatitis C: Secondary | ICD-10-CM | POA: Diagnosis present

## 2014-03-26 DIAGNOSIS — F17210 Nicotine dependence, cigarettes, uncomplicated: Secondary | ICD-10-CM | POA: Diagnosis present

## 2014-03-26 DIAGNOSIS — F3130 Bipolar disorder, current episode depressed, mild or moderate severity, unspecified: Secondary | ICD-10-CM | POA: Insufficient documentation

## 2014-03-26 DIAGNOSIS — F319 Bipolar disorder, unspecified: Secondary | ICD-10-CM | POA: Diagnosis present

## 2014-03-26 DIAGNOSIS — F1020 Alcohol dependence, uncomplicated: Secondary | ICD-10-CM | POA: Insufficient documentation

## 2014-03-26 DIAGNOSIS — R45851 Suicidal ideations: Secondary | ICD-10-CM | POA: Diagnosis present

## 2014-03-26 DIAGNOSIS — F1920 Other psychoactive substance dependence, uncomplicated: Secondary | ICD-10-CM | POA: Diagnosis present

## 2014-03-26 LAB — URINALYSIS, ROUTINE W REFLEX MICROSCOPIC
Bilirubin Urine: NEGATIVE
Glucose, UA: NEGATIVE mg/dL
Hgb urine dipstick: NEGATIVE
Ketones, ur: NEGATIVE mg/dL
Leukocytes, UA: NEGATIVE
Nitrite: NEGATIVE
Protein, ur: NEGATIVE mg/dL
Specific Gravity, Urine: 1.015 (ref 1.005–1.030)
Urobilinogen, UA: 0.2 mg/dL (ref 0.0–1.0)
pH: 6 (ref 5.0–8.0)

## 2014-03-26 LAB — HEPATITIS C GENOTYPE

## 2014-03-26 MED ORDER — ACETAMINOPHEN 325 MG PO TABS: 650.0000 mg | ORAL_TABLET | Freq: Four times a day (QID) | ORAL | Status: DC | PRN

## 2014-03-26 MED ORDER — ALUM & MAG HYDROXIDE-SIMETH 200-200-20 MG/5ML PO SUSP
30.0000 mL | ORAL | Status: DC | PRN
  Administered 2014-03-29: 30 mL via ORAL
  Filled 2014-03-26: qty 30

## 2014-03-26 MED ORDER — PANTOPRAZOLE SODIUM 40 MG PO TBEC: 40.0000 mg | DELAYED_RELEASE_TABLET | Freq: Every day | ORAL | Status: DC

## 2014-03-26 MED ORDER — LISINOPRIL 20 MG PO TABS
20.0000 mg | ORAL_TABLET | Freq: Every day | ORAL | Status: DC
  Administered 2014-03-27 – 2014-04-01 (×6): 20 mg via ORAL
  Filled 2014-03-26 (×11): qty 1

## 2014-03-26 MED ORDER — ACETAMINOPHEN 500 MG PO TABS
1000.0000 mg | ORAL_TABLET | Freq: Three times a day (TID) | ORAL | Status: DC
  Filled 2014-03-26 (×4): qty 2

## 2014-03-26 MED ORDER — GABAPENTIN 300 MG PO CAPS
300.0000 mg | ORAL_CAPSULE | Freq: Three times a day (TID) | ORAL | Status: DC
  Administered 2014-03-27 – 2014-04-06 (×31): 300 mg via ORAL
  Filled 2014-03-26 (×6): qty 1
  Filled 2014-03-26: qty 9
  Filled 2014-03-26 (×4): qty 1
  Filled 2014-03-26: qty 9
  Filled 2014-03-26 (×10): qty 1
  Filled 2014-03-26 (×3): qty 9
  Filled 2014-03-26 (×13): qty 1
  Filled 2014-03-26: qty 9
  Filled 2014-03-26 (×4): qty 1

## 2014-03-26 MED ORDER — GI COCKTAIL ~~LOC~~
30.0000 mL | Freq: Once | ORAL | Status: AC
  Administered 2014-03-26: 30 mL via ORAL
  Filled 2014-03-26: qty 30

## 2014-03-26 MED ORDER — MAGNESIUM HYDROXIDE 400 MG/5ML PO SUSP: 30.0000 mL | Freq: Every day | ORAL | Status: DC | PRN

## 2014-03-26 MED ORDER — PANTOPRAZOLE SODIUM 40 MG PO TBEC
40.0000 mg | DELAYED_RELEASE_TABLET | Freq: Every day | ORAL | Status: DC
  Administered 2014-03-27 – 2014-04-06 (×11): 40 mg via ORAL
  Filled 2014-03-26 (×14): qty 1

## 2014-03-26 MED ORDER — LISINOPRIL 10 MG PO TABS: 20.0000 mg | ORAL_TABLET | Freq: Every day | ORAL | Status: DC

## 2014-03-26 NOTE — Clinical Social Work Psych Note (Signed)
Patient has been accepted to [facility].  (Please see Arbutus Ped RN from [facility] for bed assignment).  Patient to discharge to [place] RN to cal report prior to transportation to: [phone] Transportation:[place] [phone] (Psych CSW arranged)  Packet: no packet to transfer with patient- only Voluntary Form (originial on chart)  RN and charge updated.  Psych CSW spoke with MD to also provide update.  Psych CSW met with patient at bedside to review voluntary form.  Patient is agreeable to be admitted voluntarily and is understands the 72 hour policy which was exhibited through teach back.  Patient reports being "ready for change".  Voluntary form faxed to [facility] and original placed on chart which should accompany the patient to [facility].  Nonnie Done, [place] [phone]  Psychiatric & Orthopedics (5N 1-16) Clinical Social Worker

## 2014-03-26 NOTE — Progress Notes (Signed)
Patient admitted voluntary to [facility].  Stated he was patient at [facility] approximately one year ago.  Patient stated he has been drinking one pint to half gallon alcohol daily.  Has only been one yr without alcohol.  Started drinking alcohol and using THC since 53 yrs old.  Denied heroin and cocaine.  Then stated he has been using cocaine since his 20's, last used cocaine cocaine couple nights ago.  Stated he has never married, no children.  Has been using percocets and stuff, smoking more than 20 cent bag daily.  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  Does receive disability check from government because of his bipolar.  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  R knee scar, surgery 5 years ago.  Oral surgery 3 months ago, abcess tooth.   Hx of HTN, bipolar, RA, Hep C.  Has not taken any medications in 3-4 months.  Goes to [name], [place].  Was in [employer] 1980 - 1986.  Stationed in [place].   Has brought cane with him, using cane approximately 1 yr, stated he has arthritis.  Stated he checked himself in hospital because of his pain.  Rated anxiety and depression 10, hopeless 8.  SI thoughts, contracts for safety.  Denied HI.   Denied A/V hallucinations.    Patient was breathing deeply during admission.  After taken to his room, breathing was normal.   High fall risk, fall recently before admission.  Need order for cane which he brought from home.  Fall risk information review and given to patient. Patient given food/drink. [place] 2 has blue slippers, candy, wallet with VA card, healthcare card, bus passes, [employer],  ID, [employer] card, Child psychotherapist card, food stamp card, mastercard, MCP card, SS card, medicare  Card, [employer] coupons, towel, black cell phone.  Patient has been cooperative.

## 2014-03-26 NOTE — Discharge Instructions (Signed)
Please follow up with your primary doctor at either the [facility] or [name] after your discharge from [facility].  If your right hip is still bothering you, they can help set you up with an outpatient sports medicine appointment.  You also need to follow up at the [name] with infectious disease for treatment of your chronic hepatitis C.  Major Depressive Disorder Major depressive disorder is a mental illness. It also may be called clinical depression or unipolar depression. Major depressive disorder usually causes feelings of sadness, hopelessness, or helplessness. Some people with this disorder do not feel particularly sad but lose interest in doing things they used to enjoy (anhedonia). Major depressive disorder also can cause physical symptoms. It can interfere with work, school, relationships, and other normal everyday activities. The disorder varies in severity but is longer lasting and more serious than the sadness we all feel from time to time in our lives. Major depressive disorder often is triggered by stressful life events or major life changes. Examples of these triggers include divorce, loss of your job or home, a move, and the death of a family member or close friend. Sometimes this disorder occurs for no obvious reason at all. People who have family members with major depressive disorder or bipolar disorder are at higher risk for developing this disorder, with or without life stressors. Major depressive disorder can occur at any age. It may occur just once in your life (single episode major depressive disorder). It may occur multiple times (recurrent major depressive disorder). SYMPTOMS People with major depressive disorder have either anhedonia or depressed mood on nearly a daily basis for at least 2 weeks or longer. Symptoms of depressed mood include:  Feelings of sadness (blue or down in the dumps) or emptiness.  Feelings of hopelessness or helplessness.  Tearfulness or  episodes of crying (may be observed by others).  Irritability (children and adolescents). In addition to depressed mood or anhedonia or both, people with this disorder have at least four of the following symptoms:  Difficulty sleeping or sleeping too much.   Significant change (increase or decrease) in appetite or weight.   Lack of energy or motivation.  Feelings of guilt and worthlessness.   Difficulty concentrating, remembering, or making decisions.  Unusually slow movement (psychomotor retardation) or restlessness (as observed by others).   Recurrent wishes for death, recurrent thoughts of self-harm (suicide), or a suicide attempt. People with major depressive disorder commonly have persistent negative thoughts about themselves, other people, and the world. People with severe major depressive disorder may experiencedistorted beliefs or perceptions about the world (psychotic delusions). They also may see or hear things that are not real (psychotic hallucinations). DIAGNOSIS Major depressive disorder is diagnosed through an assessment by your health care provider. Your health care provider will ask aboutaspects of your daily life, such as mood,sleep, and appetite, to see if you have the diagnostic symptoms of major depressive disorder. Your health care provider may ask about your medical history and use of alcohol or drugs, including prescription medicines. Your health care provider also may do a physical exam and blood work. This is because certain medical conditions and the use of certain substances can cause major depressive disorder-like symptoms (secondary depression). Your health care provider also may refer you to a mental health specialist for further evaluation and treatment. TREATMENT It is important to recognize the symptoms of major depressive disorder and seek treatment. The following treatments can be prescribed for this disorder:   Medicine. Antidepressant medicines  usually are prescribed. Antidepressant medicines are thought to correct chemical imbalances in the brain that are commonly associated with major depressive disorder. Other types of medicine may be added if the symptoms do not respond to antidepressant medicines alone or if psychotic delusions or hallucinations occur.  Talk therapy. Talk therapy can be helpful in treating major depressive disorder by providing support, education, and guidance. Certain types of talk therapy also can help with negative thinking (cognitive behavioral therapy) and with relationship issues that trigger this disorder (interpersonal therapy). A mental health specialist can help determine which treatment is best for you. Most people with major depressive disorder do well with a combination of medicine and talk therapy. Treatments involving electrical stimulation of the brain can be used in situations with extremely severe symptoms or when medicine and talk therapy do not work over time. These treatments include electroconvulsive therapy, transcranial magnetic stimulation, and vagal nerve stimulation. Document Released: 07/22/2012 Document Revised: 08/11/2013 Document Reviewed: 07/22/2012 [facility] Patient Information 2015 [place], [place]. This information is not intended to replace advice given to you by your health care provider. Make sure you discuss any questions you have with your health care provider.

## 2014-03-26 NOTE — Plan of Care (Signed)
Problem: Consults Goal: Substance Abuse Patient Education See Patient Education Module for education specifics.  Outcome: Completed/Met Date Met:  03/26/14 Discussed substance abuse with pt.

## 2014-03-26 NOTE — Treatment Plan (Signed)
Pt accepted by Nanine Means, NP to [facility] to room 501-2.  Please call report to [phone] and arrange transportation with [facility] if voluntary and [facility] if under IVC.

## 2014-03-26 NOTE — Progress Notes (Signed)
Pt attended karaoke group this evening.  

## 2014-03-26 NOTE — Tx Team (Addendum)
Initial Interdisciplinary Treatment Plan   PATIENT STRESSORS: Financial difficulties Health problems Medication change or noncompliance Substance abuse   PATIENT STRENGTHS: Motivation for treatment/growth  [employer] of knowledge   PROBLEM LIST: Problem List/Patient Goals Date to be addressed Date deferred Reason deferred Estimated date of resolution  "suicide ideation" 03/26/2014   D/c        "substance abuse" 03/26/2014   D/c        "depression"03/26/2014    D/c                           DISCHARGE CRITERIA:  Ability to meet basic life and health needs Adequate post-discharge living arrangements Improved stabilization in mood, thinking, and/or behavior Medical problems require only outpatient monitoring Motivation to continue treatment in a less acute level of care Need for constant or close observation no longer present Reduction of life-threatening or endangering symptoms to within safe limits Safe-care adequate arrangements made Verbal commitment to aftercare and medication compliance Withdrawal symptoms are absent or subacute and managed without 24-hour nursing intervention  PRELIMINARY DISCHARGE PLAN: Attend aftercare/continuing care group Attend PHP/IOP Attend 12-step recovery group Outpatient therapy Return to previous living arrangement  PATIENT/FAMIILY INVOLVEMENT: This treatment plan has been presented to and reviewed with the patient, [name], would like treatment for withdrawals.  The patient and family have been given the opportunity to ask questions and make suggestions.  Earline Mayotte 03/26/2014, 7:36 PM

## 2014-03-26 NOTE — Discharge Summary (Deleted)
Name: [name] MRN: [id number] DOB: 1960/04/16 53 y.o. PCP: Provider Default, MD  Date of Admission: 03/23/2014  9:28 AM Date of Discharge: 03/26/2014 Attending Physician: Farley Ly, MD  Discharge Diagnosis: Principal Problem:   Bipolar affective disorder Active Problems:   Anxiety   Alcohol abuse   Polysubstance abuse   GERD (gastroesophageal reflux disease)   Chronic back pain   Hepatitis C   Tobacco abuse   Polysubstance dependence   Foot pain, bilateral   Cocaine abuse   Chest pain   Numbness and tingling in right hand   Nausea and vomiting   Diarrhea   Cough   Fecal occult blood test positive   Right flank pain   Medically noncompliant   Right hip pain  Discharge Medications:   Medication List    STOP taking these medications        ARIPiprazole 20 MG tablet  Commonly known as:  ABILIFY      TAKE these medications        acetaminophen 500 MG tablet  Commonly known as:  TYLENOL  Take 1,000 mg by mouth 3 (three) times daily.     amLODipine 10 MG tablet  Commonly known as:  NORVASC  Take 10 mg by mouth daily.     gabapentin 300 MG capsule  Commonly known as:  NEURONTIN  Take 300 mg by mouth See admin instructions. Take 300 mg at bedtime for 14 days, then take 300 mg twice a day for 14 days, then take 300 mg three times a day     HEMORRHOIDAL-HC 25 MG suppository  Generic drug:  hydrocortisone  Place 25 mg rectally 2 (two) times daily.     hydrocortisone 2.5 % rectal cream  Commonly known as:  ANUSOL-HC  Place 1 application rectally 2 (two) times daily.     ibuprofen 800 MG tablet  Commonly known as:  ADVIL,MOTRIN  Take 800 mg by mouth every 8 (eight) hours as needed (for pain).     lisinopril 40 MG tablet  Commonly known as:  PRINIVIL,ZESTRIL  Take 40 mg by mouth daily.     pantoprazole 40 MG tablet  Commonly known as:  PROTONIX  Take 1 tablet (40 mg total) by mouth daily.        Disposition and follow-up:   [name] was discharged from [facility] in Stable condition.  At the hospital follow up visit please address:  1.  Bipolar Affective Disorder, Depression: Patient expressed thoughts of death and possible suicidal ideation in the context of polysubstance abuse and stopping his psychiatric medications. He appeared depressed on exam. Has had suicide attempts in the past. He is a voluntary admission to inpatient psychiatry.  Polysubstance Abuse: Patient was cocaine and THC + on admission. Cessation counseling initiated.  ?Right Greater Trochanter Bursitis: [facility]; could benefit from workup and possible injection if this pain has continued.  Chronic Hepatitis C: Patient has a history of hepatitis C and greatly elevated RNA VL. Needs follow up at [facility].  2.  Labs / imaging needed at time of follow-up: none  3.  Pending labs/ test needing follow-up: none  Follow-up Appointments: Follow-up With Details Why Contact Info  [facility]  Please follow up in [place] or [place] after discharge from [facility]  [address] [place] [address] [phone]   Discharge Instructions:  Please follow up with your primary doctor at either  the [facility] or [facility] [name] after your discharge from [facility].  If your right hip is still bothering you, they can help set you up with an outpatient sports medicine appointment.  You also need to follow up with infectious disease at the [name] for your hepatitis C.  Major Depressive Disorder Major depressive disorder is a mental illness. It also may be called clinical depression or unipolar depression. Major depressive disorder usually causes feelings of sadness, hopelessness, or helplessness. Some people with this disorder do not feel particularly sad but lose interest in doing things they used to enjoy (anhedonia). Major depressive disorder also can  cause physical symptoms. It can interfere with work, school, relationships, and other normal everyday activities. The disorder varies in severity but is longer lasting and more serious than the sadness we all feel from time to time in our lives. Major depressive disorder often is triggered by stressful life events or major life changes. Examples of these triggers include divorce, loss of your job or home, a move, and the death of a family member or close friend. Sometimes this disorder occurs for no obvious reason at all. People who have family members with major depressive disorder or bipolar disorder are at higher risk for developing this disorder, with or without life stressors. Major depressive disorder can occur at any age. It may occur just once in your life (single episode major depressive disorder). It may occur multiple times (recurrent major depressive disorder). SYMPTOMS People with major depressive disorder have either anhedonia or depressed mood on nearly a daily basis for at least 2 weeks or longer. Symptoms of depressed mood include:  Feelings of sadness (blue or down in the dumps) or emptiness.  Feelings of hopelessness or helplessness.  Tearfulness or episodes of crying (may be observed by others).  Irritability (children and adolescents). In addition to depressed mood or anhedonia or both, people with this disorder have at least four of the following symptoms:  Difficulty sleeping or sleeping too much.   Significant change (increase or decrease) in appetite or weight.   Lack of energy or motivation.  Feelings of guilt and worthlessness.   Difficulty concentrating, remembering, or making decisions.  Unusually slow movement (psychomotor retardation) or restlessness (as observed by others).   Recurrent wishes for death, recurrent thoughts of self-harm (suicide), or a suicide attempt. People with major depressive disorder commonly have persistent negative thoughts about  themselves, other people, and the world. People with severe major depressive disorder may experiencedistorted beliefs or perceptions about the world (psychotic delusions). They also may see or hear things that are not real (psychotic hallucinations). DIAGNOSIS Major depressive disorder is diagnosed through an assessment by your health care provider. Your health care provider will ask aboutaspects of your daily life, such as mood,sleep, and appetite, to see if you have the diagnostic symptoms of major depressive disorder. Your health care provider may ask about your medical history and use of alcohol or drugs, including prescription medicines. Your health care provider also may do a physical exam and blood work. This is because certain medical conditions and the use of certain substances can cause major depressive disorder-like symptoms (secondary depression). Your health care provider also may refer you to a mental health specialist for further evaluation and treatment. TREATMENT It is important to recognize the symptoms of major depressive disorder and seek treatment. The following treatments can be prescribed for this disorder:   Medicine. Antidepressant medicines usually are prescribed. Antidepressant medicines are thought to correct chemical imbalances  in the brain that are commonly associated with major depressive disorder. Other types of medicine may be added if the symptoms do not respond to antidepressant medicines alone or if psychotic delusions or hallucinations occur.  Talk therapy. Talk therapy can be helpful in treating major depressive disorder by providing support, education, and guidance. Certain types of talk therapy also can help with negative thinking (cognitive behavioral therapy) and with relationship issues that trigger this disorder (interpersonal therapy). A mental health specialist can help determine which treatment is best for you. Most people with major depressive disorder do  well with a combination of medicine and talk therapy. Treatments involving electrical stimulation of the brain can be used in situations with extremely severe symptoms or when medicine and talk therapy do not work over time. These treatments include electroconvulsive therapy, transcranial magnetic stimulation, and vagal nerve stimulation. Document Released: 07/22/2012 Document Revised: 08/11/2013 Document Reviewed: 07/22/2012  Consultations:    Procedures Performed:  Ct Head Wo Contrast  03/23/2014   CLINICAL DATA:  Initial encounter for 3 a history of nausea with vomiting and diarrhea +chills.  EXAM: CT HEAD WITHOUT CONTRAST  TECHNIQUE: Contiguous axial images were obtained from the base of the skull through the vertex without intravenous contrast.  COMPARISON:  None.  FINDINGS: There is no evidence for acute hemorrhage, hydrocephalus, mass lesion, or abnormal extra-axial fluid collection. No definite CT evidence for acute infarction. Diffuse loss of parenchymal volume is consistent with atrophy. The visualized paranasal sinuses and mastoid air cells are clear.  IMPRESSION: Very mild diffuse atrophy. Otherwise normal exam which is unchanged in the interval.   Electronically Signed   By: Kennith Center M.D.   On: 03/23/2014 12:42   Dg Chest Portable 1 View  03/23/2014   CLINICAL DATA:  Chest pain, cough, shortness of breath  EXAM: PORTABLE CHEST - 1 VIEW  COMPARISON:  03/22/2013  FINDINGS: Low lung volumes with vascular crowding. Mild bibasilar opacities, likely atelectasis. No pleural effusion or pneumothorax.  Cardiomegaly.  IMPRESSION: Low lung volumes with vascular crowding.  Mild bibasilar opacities, likely atelectasis.   Electronically Signed   By: Charline Bills M.D.   On: 03/23/2014 10:10   Ct Angio Chest Aorta W/cm &/or Wo/cm  03/23/2014   CLINICAL DATA:  Initial encounter for substernal chest pain radiates to the back. Pain began 2 days ago.  EXAM: CT ANGIOGRAPHY CHEST, ABDOMEN AND PELVIS   TECHNIQUE: Multidetector CT imaging through the chest, abdomen and pelvis was performed using the standard protocol during bolus administration of intravenous contrast. Multiplanar reconstructed images and MIPs were obtained and reviewed to evaluate the vascular anatomy.  CONTRAST:  OMNIPAQUE IOHEXOL 350 MG/ML SOLN  COMPARISON:  CT abdomen and pelvis from 07/13/2012. CT chest from 03/22/2013.  FINDINGS: CTA CHEST FINDINGS  Soft tissue / Mediastinum: Pre contrast imaging shows no hyperdense crescent in the wall of the thoracic aorta to suggest acute intramural hematoma. Imaging after IV contrast administration shows no evidence for dissection the thoracic aorta. No evidence for penetrating ulcer or or ulcerated plaque. Bovine arch vessel anatomy is evident with major arch vessels appearing widely patent.  There is no axillary lymphadenopathy. No mediastinal or hilar lymphadenopathy. Heart size is normal. No pericardial effusion. Thoracic esophagus is unremarkable.  Lungs / Pleura: Lung volumes are low and fine architectural detail is obscured by breathing motion during image acquisition. Ground-glass attenuation the dependent lower lobes and lung bases is probably secondary to compressive atelectasis. No dense focal airspace consolidation. No evidence for  pulmonary parenchymal nodule or mass.  Bones: Bone windows reveal no worrisome lytic or sclerotic osseous lesions.  Review of the MIP images confirms the above findings.  CTA ABDOMEN AND PELVIS FINDINGS  Hepatobiliary: Insert normal Liver There is no evidence for gallstones, gallbladder wall thickening, or pericholecystic fluid. No intrahepatic or extrahepatic biliary dilation.  Pancreas: No focal mass lesion. No dilatation of the main duct. No intraparenchymal cyst. No peripancreatic edema.  Spleen: No splenomegaly. No focal mass lesion.  Adrenals/Urinary Tract: The right adrenal gland is normal. A tiny calcification is seen in the left adrenal gland without  a nodule or mass. This could be related to prior inflammation or hemorrhage. Small cortical cysts are noted in both kidneys. The ureters are normal bilaterally. There is some early excreted contrast visible in the bladder lumen.  Stomach/Bowel: Stomach is nondistended. No gastric wall thickening. No evidence of outlet obstruction. Duodenum is normally positioned as is the ligament of Treitz. No small bowel wall thickening. No small bowel dilatation. Terminal ileum is normal. The appendix is normal. Diverticular changes seen in the left colon without diverticulitis.  Vascular/Lymphatic: There is no abdominal aortic aneurysm. There is no dissection of the abdominal aorta. Mild atherosclerotic calcification/ plaque is seen in the infrarenal aorta. The celiac axis is widely patent with normal branching configuration. The superior mesenteric artery is widely patent. Inferior mesenteric artery is widely patent. A single right renal artery is widely patent. Main left renal artery is widely patent and there is an accessory left renal artery supplying the posterior left upper renal pole.  No gastrohepatic lymphadenopathy. Upper normal size lymph nodes are seen in the hepatoduodenal ligament. There is no retroperitoneal lymphadenopathy. No pelvic sidewall lymphadenopathy.  Reproductive: Prostate gland and seminal vesicles are normal by CT imaging.  Other: No intraperitoneal free fluid.  Musculoskeletal: Bone windows reveal no worrisome lytic or sclerotic osseous lesions. Advanced degenerative disc disease is seen at L4-5.  Review of the MIP images confirms the above findings.  IMPRESSION: 1. No thoracoabdominal aortic aneurysm. No evidence for acute intramural hematoma within the thoracic aorta. No dissection of the thoracoabdominal aorta. 2. No acute findings in the chest. Specifically, no findings to explain the patient's history of substernal chest pain. 3. No acute findings in the abdomen or pelvis.   Electronically  Signed   By: Kennith Center M.D.   On: 03/23/2014 12:54   Ct Cta Abd/pel W/cm &/or W/o Cm  03/23/2014   CLINICAL DATA:  Initial encounter for substernal chest pain radiates to the back. Pain began 2 days ago.  EXAM: CT ANGIOGRAPHY CHEST, ABDOMEN AND PELVIS  TECHNIQUE: Multidetector CT imaging through the chest, abdomen and pelvis was performed using the standard protocol during bolus administration of intravenous contrast. Multiplanar reconstructed images and MIPs were obtained and reviewed to evaluate the vascular anatomy.  CONTRAST:  OMNIPAQUE IOHEXOL 350 MG/ML SOLN  COMPARISON:  CT abdomen and pelvis from 07/13/2012. CT chest from 03/22/2013.  FINDINGS: CTA CHEST FINDINGS  Soft tissue / Mediastinum: Pre contrast imaging shows no hyperdense crescent in the wall of the thoracic aorta to suggest acute intramural hematoma. Imaging after IV contrast administration shows no evidence for dissection the thoracic aorta. No evidence for penetrating ulcer or or ulcerated plaque. Bovine arch vessel anatomy is evident with major arch vessels appearing widely patent.  There is no axillary lymphadenopathy. No mediastinal or hilar lymphadenopathy. Heart size is normal. No pericardial effusion. Thoracic esophagus is unremarkable.  Lungs / Pleura: Lung volumes are  low and fine architectural detail is obscured by breathing motion during image acquisition. Ground-glass attenuation the dependent lower lobes and lung bases is probably secondary to compressive atelectasis. No dense focal airspace consolidation. No evidence for pulmonary parenchymal nodule or mass.  Bones: Bone windows reveal no worrisome lytic or sclerotic osseous lesions.  Review of the MIP images confirms the above findings.  CTA ABDOMEN AND PELVIS FINDINGS  Hepatobiliary: Insert normal Liver There is no evidence for gallstones, gallbladder wall thickening, or pericholecystic fluid. No intrahepatic or extrahepatic biliary dilation.  Pancreas: No focal mass  lesion. No dilatation of the main duct. No intraparenchymal cyst. No peripancreatic edema.  Spleen: No splenomegaly. No focal mass lesion.  Adrenals/Urinary Tract: The right adrenal gland is normal. A tiny calcification is seen in the left adrenal gland without a nodule or mass. This could be related to prior inflammation or hemorrhage. Small cortical cysts are noted in both kidneys. The ureters are normal bilaterally. There is some early excreted contrast visible in the bladder lumen.  Stomach/Bowel: Stomach is nondistended. No gastric wall thickening. No evidence of outlet obstruction. Duodenum is normally positioned as is the ligament of Treitz. No small bowel wall thickening. No small bowel dilatation. Terminal ileum is normal. The appendix is normal. Diverticular changes seen in the left colon without diverticulitis.  Vascular/Lymphatic: There is no abdominal aortic aneurysm. There is no dissection of the abdominal aorta. Mild atherosclerotic calcification/ plaque is seen in the infrarenal aorta. The celiac axis is widely patent with normal branching configuration. The superior mesenteric artery is widely patent. Inferior mesenteric artery is widely patent. A single right renal artery is widely patent. Main left renal artery is widely patent and there is an accessory left renal artery supplying the posterior left upper renal pole.  No gastrohepatic lymphadenopathy. Upper normal size lymph nodes are seen in the hepatoduodenal ligament. There is no retroperitoneal lymphadenopathy. No pelvic sidewall lymphadenopathy.  Reproductive: Prostate gland and seminal vesicles are normal by CT imaging.  Other: No intraperitoneal free fluid.  Musculoskeletal: Bone windows reveal no worrisome lytic or sclerotic osseous lesions. Advanced degenerative disc disease is seen at L4-5.  Review of the MIP images confirms the above findings.  IMPRESSION: 1. No thoracoabdominal aortic aneurysm. No evidence for acute intramural hematoma  within the thoracic aorta. No dissection of the thoracoabdominal aorta. 2. No acute findings in the chest. Specifically, no findings to explain the patient's history of substernal chest pain. 3. No acute findings in the abdomen or pelvis.   Electronically Signed   By: Kennith Center M.D.   On: 03/23/2014 12:54     Admission HPI:  Mr. Kiehn is a 53 yo man with a history of hypertension, generalized anxiety disorder, bipolar disorder, polysubstance abuse, rheumatoid arthritis and hepatitis C who presented with a variety of URI, GI and neurologic symptoms along with significant depression. He states that he has been experiencing chills, productive cough, nausea, vomiting, diarrhea, blood on the toilet paper after BMs and 8/10, right lower back pain and sharp chest pain when belching for the past 3 days. He had an episode of tingling in the fingers of his right hand over this same time period that resolved with better sleep. This was not associated with any weakness or changes in sensation. He has a sick contact in a neighbor who he spent some time with last week. He also admits to depressed mood, isolation near the holidays, feelings of guilt, difficulty sleeping, decreased appetite and general thoughts that he  may want to hurt himself (thoug he has no plan). He would like to speak with a psychiatrist during this admission.  He receives his medical care at the [facility] [name] but has not been taking his medications for "many months". These medications were abilify, some blood pressure medications, prilosec and a medication for rheumatoid arthritis.   Of note, he was admitted at the same time (just before the holidays) last year with chest pain; he left AMA the day after admission.  Patient given aspirin 324, nitro x2, ativan x 2 mg total, zofran 4mg  and morphine 4 mg in the ED with no relief and was admitted to the IMTS for chest pain rule-out.  Hospital Course by problem list: Principal  Problem:   Bipolar affective disorder Active Problems:   Anxiety   Polysubstance abuse   Hepatitis C   Polysubstance dependence   Medically noncompliant   Right hip pain   Mr. Hagenow is a 53 yo man who was admitted for chest pain rule-out with a multitude of somatic complaints in the context of cocaine and THC use, depressed mood and sadness about being alone during the holidays. After his psychiatry consult, he was admitted to inpatient psychiatry.  Bipolar Affective Disorder/ History of Anxiety/ History of Depression: Patient was off psychiatric medications and having depression and thoughts of death with thoughts of suicide. He has a history of suicide attempts. Substance abuse (+cocaine and THC) may have contributed as outpatient. Patient appeared depressed on exam and was transferred to inpatient psychiatry.  Resolved Atypical Chest Pain: Patient has a history of admission for chest pain following cocaine use at this time of year. Troponins negative. EKG with J-point elevation in 3 and AVF. Cards consulted (Dr. Katrinka Blazing) via phone in ED and no concerned for STEMI per ED physician. CTA negative for aortic dissection or other acute findings. Spoke with radiologist CT protocol was done for dissection and not PE (making it of poor quality for determining PE), but Radiologist reviewed CT chest and states it is negative for PE. Etiology could be MSK as it is reproducible on exam vs vasospasm as he is cocaine + vs GERD related, as patient has been off of his prilosec for many months and feels the worst pain on belching. Patient given aspirin 324, nitro x2, ativan x 2 mg total, zofran 4mg  and morphine 4 mg in the ED with no relief. Patient became more comfortable with no further chest pain on protonix.   Right Hip Pain: CTA abdomen/pelvis negative, UA negative x2. Appears to be musculoskeletal given worsening with walking. May be greater trochanter bursitis? Could consider outpatient sports medicine for  evaluation and possible injection if pain continues.  History of Hepatitis C: Elevated AST/ALT. No evidence of cirrhosis on CT today. Hepatitis B surface ag negative, HCV ab reactive, Hep A IgM nonreactive, Hep B C IgM nonreactive. HCV RNA quant [id number] with a quantitative log 5.88, indicative of chronic(not resolved) hepatitis C. Patient does not have cirrhosis on CT/CTA abdomen/pelvis. Alcohol cessation counseling was initiated. Consider referral to ID clinic for discussion on initiating antiviral therapy  Resolved Nausea, Vomiting, Diarrhea, Chills, Lightheadedness, Productive Cough: CXR with low lung volumes with vascular crowing and mild bibasilar opacities (likely atelectasis). Flu and c diff negative.   FOBT+: No hemmorhoids externally with FOBT +. He does have diverticulosis noted on CT. CBC stable with hemoglobin 14.7. No bloody BMs while here.  Resolved Right Arm Numbness: Odd history, with symptoms less severe with extra sleep.  Polysubstance Abuse: THC/cocaine + UDS with history of alcohol and tobacco abuse. Cessation counseling was initiated.   Discharge Vitals:   BP 144/101 mmHg  Pulse 71  Temp(Src) 97.8 F (36.6 C) (Oral)  Resp 20  Ht 5\' 7"  (1.702 m)  Wt 259 lb 0.7 oz (117.5 kg)  BMI 40.56 kg/m2  SpO2 99%  Discharge Labs:  Results for orders placed or performed during the hospital encounter of 03/23/14 (from the past 24 hour(s))  Urinalysis, Routine w reflex microscopic     Status: None   Collection Time: 03/26/14  4:30 AM  Result Value Ref Range   Color, Urine YELLOW YELLOW   APPearance CLEAR CLEAR   Specific Gravity, Urine 1.015 1.005 - 1.030   pH 6.0 5.0 - 8.0   Glucose, UA NEGATIVE NEGATIVE mg/dL   Hgb urine dipstick NEGATIVE NEGATIVE   Bilirubin Urine NEGATIVE NEGATIVE   Ketones, ur NEGATIVE NEGATIVE mg/dL   Protein, ur NEGATIVE NEGATIVE mg/dL   Urobilinogen, UA 0.2 0.0 - 1.0 mg/dL   Nitrite NEGATIVE NEGATIVE   Leukocytes, UA NEGATIVE NEGATIVE     Signed: 03/28/14, MD 03/26/2014, 8:29 AM    Services Ordered on Discharge: inpatient psychiatry Equipment Ordered on Discharge: none

## 2014-03-26 NOTE — Progress Notes (Signed)
[name] [name] [facility] [phone] they will call patient w/in the next 3 days for appt in 1-2 weeks   Shirlee Latch MD

## 2014-03-26 NOTE — Progress Notes (Signed)
Subjective: Patient was very pleased to talk to someone from behavioral health yesterday and found it helpful. He believes the inpatient admission will help him. He continues to feel "terrible", most bothered by his right hip pain and "feeling sick".   Objective: Vital signs in last 24 hours: Filed Vitals:   03/25/14 1343 03/25/14 1946 03/25/14 2139 03/26/14 0646  BP: 150/86 143/91 146/79 144/101  Pulse: 64 73 67 71  Temp: 98.4 F (36.9 C) 97.9 F (36.6 C) 98.2 F (36.8 C) 97.8 F (36.6 C)  TempSrc: Oral Oral Oral Oral  Resp: 19  20 20   Height:      Weight:    259 lb 0.7 oz (117.5 kg)  SpO2: 99% 98% 99% 99%   Weight change: 13.5 oz (0.381 kg)  Intake/Output Summary (Last 24 hours) at 03/26/14 0928 Last data filed at 03/26/14 0850  Gross per 24 hour  Intake   1200 ml  Output      0 ml  Net   1200 ml   Physical Exam: Appearance: in NAD, brushing his teeth, then back in bed. Sitter at bedside.  HEENT: AT/[place], PERRL, EOMi, no lymphadenopathy Heart: RRR, normal S1S2, no tenderness  Lungs: CTAB, no wheezes Abdomen: BS+, soft, diffusely tender, no organomegaly, no visible external hemorrhoids on admission exam Msk: tenderness to deep palpation superior right hip Extremities: no edema b/l  Neurologic: A&Ox3, strength and sensation intact throughout, coordination intact Psychiatric: appears anxious, states that he has depressed mood, thoughts of death, no plan for suicide or hurting self (though expressed SI to psychiatry yesterday), no HI, worried about being home alone, not teary on today's exam  Lab Results: Basic Metabolic Panel:  Recent Labs Lab 03/23/14 0943 03/23/14 0957 03/23/14 1532 03/24/14 0103  NA 139 140  --  137  K 3.5* 3.3*  --  3.7  CL 103 105  --  102  CO2 21  --   --  20  GLUCOSE 94 96  --  90  BUN 8 7  --  9  CREATININE 0.99 1.10  --  1.02  CALCIUM 9.6  --   --  9.3  MG  --   --  1.9  --    Liver Function Tests:  Recent Labs Lab  03/23/14 0943 03/24/14 0103  AST 185* 160*  ALT 373* 356*  ALKPHOS 58 55  BILITOT 0.6 0.4  PROT 7.9 7.4  ALBUMIN 4.1 3.8    Recent Labs Lab 03/23/14 0943  LIPASE 39   CBC:  Recent Labs Lab 03/23/14 0943 03/23/14 0957 03/24/14 0103  WBC 6.8  --  7.9  NEUTROABS 4.7  --  4.8  HGB 15.5 16.7 14.7  HCT 44.0 49.0 42.3  MCV 80.4  --  80.1  PLT 233  --  260   Cardiac Enzymes:  Recent Labs Lab 03/23/14 1335 03/23/14 1935 03/24/14 0103  TROPONINI <0.30 <0.30 <0.30   Hemoglobin A1C:  Recent Labs Lab 03/23/14 1935  HGBA1C 6.4*   Fasting Lipid Panel:  Recent Labs Lab 03/23/14 1813  CHOL 154  HDL 32*  LDLCALC 97  TRIG 03/25/14  CHOLHDL 4.8   Urine Drug Screen: Drugs of Abuse     Component Value Date/Time   LABOPIA NONE DETECTED 03/23/2014 1245   COCAINSCRNUR POSITIVE* 03/23/2014 1245   LABBENZ NONE DETECTED 03/23/2014 1245   AMPHETMU NONE DETECTED 03/23/2014 1245   THCU POSITIVE* 03/23/2014 1245   LABBARB NONE DETECTED 03/23/2014 1245    Alcohol Level:  Recent Labs Lab 03/23/14 0943  ETH <11   Urinalysis:  Recent Labs Lab 03/23/14 1245 03/26/14 0430  COLORURINE YELLOW YELLOW  LABSPEC 1.038* 1.015  PHURINE 7.0 6.0  GLUCOSEU NEGATIVE NEGATIVE  HGBUR NEGATIVE NEGATIVE  BILIRUBINUR NEGATIVE NEGATIVE  KETONESUR NEGATIVE NEGATIVE  PROTEINUR NEGATIVE NEGATIVE  UROBILINOGEN 1.0 0.2  NITRITE NEGATIVE NEGATIVE  LEUKOCYTESUR NEGATIVE NEGATIVE   Micro Results: Recent Results (from the past 240 hour(s))  MRSA PCR Screening     Status: None   Collection Time: 03/23/14  5:34 PM  Result Value Ref Range Status   MRSA by PCR NEGATIVE NEGATIVE Final    Comment:        The GeneXpert MRSA Assay (FDA approved for NASAL specimens only), is one component of a comprehensive MRSA colonization surveillance program. It is not intended to diagnose MRSA infection nor to guide or monitor treatment for MRSA infections.   Clostridium Difficile by PCR      Status: None   Collection Time: 03/24/14  4:06 PM  Result Value Ref Range Status   C difficile by pcr NEGATIVE NEGATIVE Final   Studies/Results: No results found. Medications: I have reviewed the patient's current medications. Scheduled Meds: . gi cocktail  30 mL Oral Once  . heparin subcutaneous  5,000 Units Subcutaneous 3 times per day  . pantoprazole  40 mg Oral Daily  . sodium chloride  3 mL Intravenous Q12H  . traMADol  50 mg Oral 4 times per day   Continuous Infusions:  PRN Meds:.dextromethorphan-guaiFENesin, ondansetron (ZOFRAN) IV Assessment/Plan: Principal Problem:   Bipolar affective disorder Active Problems:   Anxiety   Polysubstance abuse   Hepatitis C   Polysubstance dependence   Medically noncompliant   Right hip pain  [name] is a 53 yo man who was admitted for chest pain rule-out with a multitude of somatic complaints in the context of cocaine and THC use, depressed mood and sadness about being alone during the holidays. Psychiatry evaluated him and will admit him to inpatient psychiatry. He is ready medically for discharge and admission to inpatient psychiatry.  Bipolar Affective Disorder/ History of Anxiety/ History of Depression: Patient was off psychiatric medications and having depression and thoughts of death with thoughts of suicide. He has a history of suicide attempts. Substance abuse (+cocaine and THC) may have contributed as outpatient. Patient appeared depressed on exam and expressed SI to psychiatry.  - Transfer to inpatient psychiatry for management and medication initiation (previously on Abilify) - Sitting in place prior to voluntary transfer  Resolved Atypical Chest Pain:  - Continue protonix - GI cocktails as needed  Right Hip Pain: CTA abdomen/pelvis negative, UA negative x2. Appears to be musculoskeletal given worsening with walking. May be greater trochanter bursitis? Could consider outpatient sports medicine for evaluation and possible  injection if pain continues. Repeat UA negative. - Tramadol for pain q6hours  History of Hepatitis C: Elevated AST/ALT. No evidence of cirrhosis on CT today. Hepatitis B surface ag negative, HCV ab reactive, Hep A IgM nonreactive, Hep B C IgM nonreactive. HCV RNA quant [id number] with a quantitative log 5.88, indicative of chronic(not resolved) hepatitis C. Patient does not have cirrhosis on CT/CTA abdomen/pelvis. Alcohol cessation counseling was initiated. Consider referral to ID clinic for discussion on initiating antiviral therapy  Resolved Nausea, Vomiting, Diarrhea, Chills, Lightheadedness, Productive Cough: CXR with low lung volumes with vascular crowing and mild bibasilar opacities (likely atelectasis). Flu and c diff negative.  - Incentive spirometry - PRN Mucinex DM  -  PRN Zofran   FOBT+: No hemmorhoids externally with FOBT +. He does have diverticulosis noted on CT. CBC stable with hemoglobin 14.7. No bloody BMs while here.  Resolved Right Arm Numbness: Odd history, with symptoms less severe with extra sleep. Has not recurred. CT head negative.  Polysubstance Abuse: THC/cocaine + UDS with history of alcohol and tobacco abuse.  - Cessation counseling   History of GERD: Chest pain (see above) with belching in context of noncompliance with Prilosec, may have been due to GERD. - Reumed Protonix  - No further complaints of pain to RN  History of Hypertension: BP high as 152/98 in ED. Currently 140s/80s. Pt was taking medication for HTN but noncompliant with meds outpatient and unsure which medications he was taking - Continue to monitor BP - Will send out on lisinopril in context of pre-diabetes  History of Prediabetes: Prior A1c 6.3% in 03/2013. 6.4% now. CBGs have been 84-106. No need for ISS now.  F/E/N: - NSL - Heart healthy/carb mod diet   DVT ppx:  - [place] heparin  - SCDs  Dispo: Disposition is deferred at this time, awaiting improvement of current medical problems.   Anticipated discharge in approximately 0 day(s).  Have received multiple calls about patient not meeting qualifications for hospitalization, but awaiting psychiatry consult.  The patient does have a current PCP ([place] [facility]) and does not need an [facility] hospital follow-up appointment after discharge.  The patient does not have transportation limitations that hinder transportation to clinic appointments.  .Services Needed at time of discharge: Y = Yes, Blank = No PT:   OT:   RN:   Equipment:   Other:     LOS: 3 days   [name] Ano, MD 03/26/2014, 9:28 AM

## 2014-03-26 NOTE — Discharge Summary (Signed)
Name: [name] MRN: [id number] DOB: [birth year] 53 y.o. PCP: Provider Default, MD  Date of Admission: 03/23/2014  9:28 AM Date of Discharge: 03/26/2014 Attending Physician: Farley Ly, MD  Discharge Diagnosis: Principal Problem:   Bipolar affective disorder Active Problems:   Anxiety   Polysubstance abuse   Hepatitis C   Polysubstance dependence   Medically noncompliant   Right hip pain  Discharge Medications:   Medication List    STOP taking these medications        amLODipine 10 MG tablet  Commonly known as:  NORVASC     ARIPiprazole 20 MG tablet  Commonly known as:  ABILIFY      TAKE these medications        acetaminophen 500 MG tablet  Commonly known as:  TYLENOL  Take 1,000 mg by mouth 3 (three) times daily.     gabapentin 300 MG capsule  Commonly known as:  NEURONTIN  Take 300 mg by mouth See admin instructions. Take 300 mg at bedtime for 14 days, then take 300 mg twice a day for 14 days, then take 300 mg three times a day     HEMORRHOIDAL-HC 25 MG suppository  Generic drug:  hydrocortisone  Place 25 mg rectally 2 (two) times daily.     hydrocortisone 2.5 % rectal cream  Commonly known as:  ANUSOL-HC  Place 1 application rectally 2 (two) times daily.     ibuprofen 800 MG tablet  Commonly known as:  ADVIL,MOTRIN  Take 800 mg by mouth every 8 (eight) hours as needed (for pain).     lisinopril 10 MG tablet  Commonly known as:  PRINIVIL  Take 2 tablets (20 mg total) by mouth daily.     pantoprazole 40 MG tablet  Commonly known as:  PROTONIX  Take 1 tablet (40 mg total) by mouth daily.        Disposition and follow-up:   [name] was discharged from [facility] in Stable condition.  At the hospital follow up visit please address:  1.  Bipolar Affective Disorder, Depression: Patient expressed thoughts of death and possible suicidal ideation in the context of polysubstance abuse and stopping his psychiatric  medications. He appeared depressed on exam. Has had suicide attempts in the past. He is a voluntary admission to inpatient psychiatry.  Polysubstance Abuse: Patient was cocaine and THC + on admission. Cessation counseling initiated.  ?Right Greater Trochanter Bursitis: [facility]; could benefit from workup and possible injection if this pain has continued.  Chronic Hepatitis C: Patient has a history of hepatitis C and greatly elevated RNA VL. Needs follow up at [facility].  Heme + Stool: Diverticulosis noted on CT, but given his age, needs screening colonoscopy as outpatient. Hgb was stable.  Hypertension: Patient has not been taking anti-hypertensives (states that he was on lisinopril and maybe norvasc in the past). Will restart lisinopril at lower dose 20 mg given that he stayed in the 140s-150s / 80s while here. Please manipulate as needed.  2.  Labs / imaging needed at time of follow-up: none  3.  Pending labs/ test needing follow-up: none  Follow-up Appointments: Follow-up Information    Follow up with [facility].   Specialty:  General Practice   Why:  Please follow up in [place] or [place] after discharge from [facility] Health    Contact information:   [address] [place] [address] [phone]  Follow-up With Details Why Contact Info  [facility]  Please follow up in [place] or [place] after discharge from Behavior Health  [address] [place] [address] [phone]   Discharge Instructions:  Please follow up with your primary doctor at either the [facility] or [facility] [name] after your discharge from [facility].  If your right hip is still bothering you, they can help set you up with an outpatient sports medicine appointment.  You also need to follow up with infectious disease at the [facility] for your hepatitis C.  Major Depressive Disorder Major depressive  disorder is a mental illness. It also may be called clinical depression or unipolar depression. Major depressive disorder usually causes feelings of sadness, hopelessness, or helplessness. Some people with this disorder do not feel particularly sad but lose interest in doing things they used to enjoy (anhedonia). Major depressive disorder also can cause physical symptoms. It can interfere with work, school, relationships, and other normal everyday activities. The disorder varies in severity but is longer lasting and more serious than the sadness we all feel from time to time in our lives. Major depressive disorder often is triggered by stressful life events or major life changes. Examples of these triggers include divorce, loss of your job or home, a move, and the death of a family member or close friend. Sometimes this disorder occurs for no obvious reason at all. People who have family members with major depressive disorder or bipolar disorder are at higher risk for developing this disorder, with or without life stressors. Major depressive disorder can occur at any age. It may occur just once in your life (single episode major depressive disorder). It may occur multiple times (recurrent major depressive disorder). SYMPTOMS People with major depressive disorder have either anhedonia or depressed mood on nearly a daily basis for at least 2 weeks or longer. Symptoms of depressed mood include:  Feelings of sadness (blue or down in the dumps) or emptiness.  Feelings of hopelessness or helplessness.  Tearfulness or episodes of crying (may be observed by others).  Irritability (children and adolescents). In addition to depressed mood or anhedonia or both, people with this disorder have at least four of the following symptoms:  Difficulty sleeping or sleeping too much.   Significant change (increase or decrease) in appetite or weight.   Lack of energy or motivation.  Feelings of guilt and  worthlessness.   Difficulty concentrating, remembering, or making decisions.  Unusually slow movement (psychomotor retardation) or restlessness (as observed by others).   Recurrent wishes for death, recurrent thoughts of self-harm (suicide), or a suicide attempt. People with major depressive disorder commonly have persistent negative thoughts about themselves, other people, and the world. People with severe major depressive disorder may experiencedistorted beliefs or perceptions about the world (psychotic delusions). They also may see or hear things that are not real (psychotic hallucinations). DIAGNOSIS Major depressive disorder is diagnosed through an assessment by your health care provider. Your health care provider will ask aboutaspects of your daily life, such as mood,sleep, and appetite, to see if you have the diagnostic symptoms of major depressive disorder. Your health care provider may ask about your medical history and use of alcohol or drugs, including prescription medicines. Your health care provider also may do a physical exam and blood work. This is because certain medical conditions and the use of certain substances can cause major depressive disorder-like symptoms (secondary depression). Your health care provider also may refer you to a mental health specialist  for further evaluation and treatment. TREATMENT It is important to recognize the symptoms of major depressive disorder and seek treatment. The following treatments can be prescribed for this disorder:   Medicine. Antidepressant medicines usually are prescribed. Antidepressant medicines are thought to correct chemical imbalances in the brain that are commonly associated with major depressive disorder. Other types of medicine may be added if the symptoms do not respond to antidepressant medicines alone or if psychotic delusions or hallucinations occur.  Talk therapy. Talk therapy can be helpful in treating major depressive  disorder by providing support, education, and guidance. Certain types of talk therapy also can help with negative thinking (cognitive behavioral therapy) and with relationship issues that trigger this disorder (interpersonal therapy). A mental health specialist can help determine which treatment is best for you. Most people with major depressive disorder do well with a combination of medicine and talk therapy. Treatments involving electrical stimulation of the brain can be used in situations with extremely severe symptoms or when medicine and talk therapy do not work over time. These treatments include electroconvulsive therapy, transcranial magnetic stimulation, and vagal nerve stimulation. Document Released: 07/22/2012 Document Revised: 08/11/2013 Document Reviewed: 07/22/2012  Consultations:    Procedures Performed:  Ct Head Wo Contrast  03/23/2014   CLINICAL DATA:  Initial encounter for 3 a history of nausea with vomiting and diarrhea +chills.  EXAM: CT HEAD WITHOUT CONTRAST  TECHNIQUE: Contiguous axial images were obtained from the base of the skull through the vertex without intravenous contrast.  COMPARISON:  None.  FINDINGS: There is no evidence for acute hemorrhage, hydrocephalus, mass lesion, or abnormal extra-axial fluid collection. No definite CT evidence for acute infarction. Diffuse loss of parenchymal volume is consistent with atrophy. The visualized paranasal sinuses and mastoid air cells are clear.  IMPRESSION: Very mild diffuse atrophy. Otherwise normal exam which is unchanged in the interval.   Electronically Signed   By: Kennith Center M.D.   On: 03/23/2014 12:42   Dg Chest Portable 1 View  03/23/2014   CLINICAL DATA:  Chest pain, cough, shortness of breath  EXAM: PORTABLE CHEST - 1 VIEW  COMPARISON:  03/22/2013  FINDINGS: Low lung volumes with vascular crowding. Mild bibasilar opacities, likely atelectasis. No pleural effusion or pneumothorax.  Cardiomegaly.  IMPRESSION: Low lung  volumes with vascular crowding.  Mild bibasilar opacities, likely atelectasis.   Electronically Signed   By: Charline Bills M.D.   On: 03/23/2014 10:10   Ct Angio Chest Aorta W/cm &/or Wo/cm  03/23/2014   CLINICAL DATA:  Initial encounter for substernal chest pain radiates to the back. Pain began 2 days ago.  EXAM: CT ANGIOGRAPHY CHEST, ABDOMEN AND PELVIS  TECHNIQUE: Multidetector CT imaging through the chest, abdomen and pelvis was performed using the standard protocol during bolus administration of intravenous contrast. Multiplanar reconstructed images and MIPs were obtained and reviewed to evaluate the vascular anatomy.  CONTRAST:  OMNIPAQUE IOHEXOL 350 MG/ML SOLN  COMPARISON:  CT abdomen and pelvis from 07/13/2012. CT chest from 03/22/2013.  FINDINGS: CTA CHEST FINDINGS  Soft tissue / Mediastinum: Pre contrast imaging shows no hyperdense crescent in the wall of the thoracic aorta to suggest acute intramural hematoma. Imaging after IV contrast administration shows no evidence for dissection the thoracic aorta. No evidence for penetrating ulcer or or ulcerated plaque. Bovine arch vessel anatomy is evident with major arch vessels appearing widely patent.  There is no axillary lymphadenopathy. No mediastinal or hilar lymphadenopathy. Heart size is normal. No pericardial effusion. Thoracic esophagus  is unremarkable.  Lungs / Pleura: Lung volumes are low and fine architectural detail is obscured by breathing motion during image acquisition. Ground-glass attenuation the dependent lower lobes and lung bases is probably secondary to compressive atelectasis. No dense focal airspace consolidation. No evidence for pulmonary parenchymal nodule or mass.  Bones: Bone windows reveal no worrisome lytic or sclerotic osseous lesions.  Review of the MIP images confirms the above findings.  CTA ABDOMEN AND PELVIS FINDINGS  Hepatobiliary: Insert normal Liver There is no evidence for gallstones, gallbladder wall  thickening, or pericholecystic fluid. No intrahepatic or extrahepatic biliary dilation.  Pancreas: No focal mass lesion. No dilatation of the main duct. No intraparenchymal cyst. No peripancreatic edema.  Spleen: No splenomegaly. No focal mass lesion.  Adrenals/Urinary Tract: The right adrenal gland is normal. A tiny calcification is seen in the left adrenal gland without a nodule or mass. This could be related to prior inflammation or hemorrhage. Small cortical cysts are noted in both kidneys. The ureters are normal bilaterally. There is some early excreted contrast visible in the bladder lumen.  Stomach/Bowel: Stomach is nondistended. No gastric wall thickening. No evidence of outlet obstruction. Duodenum is normally positioned as is the ligament of Treitz. No small bowel wall thickening. No small bowel dilatation. Terminal ileum is normal. The appendix is normal. Diverticular changes seen in the left colon without diverticulitis.  Vascular/Lymphatic: There is no abdominal aortic aneurysm. There is no dissection of the abdominal aorta. Mild atherosclerotic calcification/ plaque is seen in the infrarenal aorta. The celiac axis is widely patent with normal branching configuration. The superior mesenteric artery is widely patent. Inferior mesenteric artery is widely patent. A single right renal artery is widely patent. Main left renal artery is widely patent and there is an accessory left renal artery supplying the posterior left upper renal pole.  No gastrohepatic lymphadenopathy. Upper normal size lymph nodes are seen in the hepatoduodenal ligament. There is no retroperitoneal lymphadenopathy. No pelvic sidewall lymphadenopathy.  Reproductive: Prostate gland and seminal vesicles are normal by CT imaging.  Other: No intraperitoneal free fluid.  Musculoskeletal: Bone windows reveal no worrisome lytic or sclerotic osseous lesions. Advanced degenerative disc disease is seen at L4-5.  Review of the MIP images confirms  the above findings.  IMPRESSION: 1. No thoracoabdominal aortic aneurysm. No evidence for acute intramural hematoma within the thoracic aorta. No dissection of the thoracoabdominal aorta. 2. No acute findings in the chest. Specifically, no findings to explain the patient's history of substernal chest pain. 3. No acute findings in the abdomen or pelvis.   Electronically Signed   By: Kennith Center M.D.   On: 03/23/2014 12:54   Ct Cta Abd/pel W/cm &/or W/o Cm  03/23/2014   CLINICAL DATA:  Initial encounter for substernal chest pain radiates to the back. Pain began 2 days ago.  EXAM: CT ANGIOGRAPHY CHEST, ABDOMEN AND PELVIS  TECHNIQUE: Multidetector CT imaging through the chest, abdomen and pelvis was performed using the standard protocol during bolus administration of intravenous contrast. Multiplanar reconstructed images and MIPs were obtained and reviewed to evaluate the vascular anatomy.  CONTRAST:  OMNIPAQUE IOHEXOL 350 MG/ML SOLN  COMPARISON:  CT abdomen and pelvis from 07/13/2012. CT chest from 03/22/2013.  FINDINGS: CTA CHEST FINDINGS  Soft tissue / Mediastinum: Pre contrast imaging shows no hyperdense crescent in the wall of the thoracic aorta to suggest acute intramural hematoma. Imaging after IV contrast administration shows no evidence for dissection the thoracic aorta. No evidence for penetrating ulcer or  or ulcerated plaque. Bovine arch vessel anatomy is evident with major arch vessels appearing widely patent.  There is no axillary lymphadenopathy. No mediastinal or hilar lymphadenopathy. Heart size is normal. No pericardial effusion. Thoracic esophagus is unremarkable.  Lungs / Pleura: Lung volumes are low and fine architectural detail is obscured by breathing motion during image acquisition. Ground-glass attenuation the dependent lower lobes and lung bases is probably secondary to compressive atelectasis. No dense focal airspace consolidation. No evidence for pulmonary parenchymal nodule or  mass.  Bones: Bone windows reveal no worrisome lytic or sclerotic osseous lesions.  Review of the MIP images confirms the above findings.  CTA ABDOMEN AND PELVIS FINDINGS  Hepatobiliary: Insert normal Liver There is no evidence for gallstones, gallbladder wall thickening, or pericholecystic fluid. No intrahepatic or extrahepatic biliary dilation.  Pancreas: No focal mass lesion. No dilatation of the main duct. No intraparenchymal cyst. No peripancreatic edema.  Spleen: No splenomegaly. No focal mass lesion.  Adrenals/Urinary Tract: The right adrenal gland is normal. A tiny calcification is seen in the left adrenal gland without a nodule or mass. This could be related to prior inflammation or hemorrhage. Small cortical cysts are noted in both kidneys. The ureters are normal bilaterally. There is some early excreted contrast visible in the bladder lumen.  Stomach/Bowel: Stomach is nondistended. No gastric wall thickening. No evidence of outlet obstruction. Duodenum is normally positioned as is the ligament of Treitz. No small bowel wall thickening. No small bowel dilatation. Terminal ileum is normal. The appendix is normal. Diverticular changes seen in the left colon without diverticulitis.  Vascular/Lymphatic: There is no abdominal aortic aneurysm. There is no dissection of the abdominal aorta. Mild atherosclerotic calcification/ plaque is seen in the infrarenal aorta. The celiac axis is widely patent with normal branching configuration. The superior mesenteric artery is widely patent. Inferior mesenteric artery is widely patent. A single right renal artery is widely patent. Main left renal artery is widely patent and there is an accessory left renal artery supplying the posterior left upper renal pole.  No gastrohepatic lymphadenopathy. Upper normal size lymph nodes are seen in the hepatoduodenal ligament. There is no retroperitoneal lymphadenopathy. No pelvic sidewall lymphadenopathy.  Reproductive: Prostate gland  and seminal vesicles are normal by CT imaging.  Other: No intraperitoneal free fluid.  Musculoskeletal: Bone windows reveal no worrisome lytic or sclerotic osseous lesions. Advanced degenerative disc disease is seen at L4-5.  Review of the MIP images confirms the above findings.  IMPRESSION: 1. No thoracoabdominal aortic aneurysm. No evidence for acute intramural hematoma within the thoracic aorta. No dissection of the thoracoabdominal aorta. 2. No acute findings in the chest. Specifically, no findings to explain the patient's history of substernal chest pain. 3. No acute findings in the abdomen or pelvis.   Electronically Signed   By: Kennith Center M.D.   On: 03/23/2014 12:54     Admission HPI:  [name] is a 54 yo man with a history of hypertension, generalized anxiety disorder, bipolar disorder, polysubstance abuse, rheumatoid arthritis and hepatitis C who presented with a variety of URI, GI and neurologic symptoms along with significant depression. He states that he has been experiencing chills, productive cough, nausea, vomiting, diarrhea, blood on the toilet paper after BMs and 8/10, right lower back pain and sharp chest pain when belching for the past 3 days. He had an episode of tingling in the fingers of his right hand over this same time period that resolved with better sleep. This was not associated  with any weakness or changes in sensation. He has a sick contact in a neighbor who he spent some time with last week. He also admits to depressed mood, isolation near the holidays, feelings of guilt, difficulty sleeping, decreased appetite and general thoughts that he may want to hurt himself (thoug he has no plan). He would like to speak with a psychiatrist during this admission.  He receives his medical care at the [place] and [name] but has not been taking his medications for "many months". These medications were abilify, some blood pressure medications, prilosec and a medication for  rheumatoid arthritis.   Of note, he was admitted at the same time (just before the holidays) last year with chest pain; he left AMA the day after admission.  Patient given aspirin 324, nitro x2, ativan x 2 mg total, zofran  and morphine 4 mg in the ED with no relief and was admitted to the [facility] for chest pain rule-out.  Hospital Course by problem list: Principal Problem:   Bipolar affective disorder Active Problems:   Anxiety   Polysubstance abuse   Hepatitis C   Polysubstance dependence   Medically noncompliant   Right hip pain   [name] is a 52 yo man who was admitted for chest pain rule-out with a multitude of somatic complaints in the context of cocaine and THC use, depressed mood and sadness about being alone during the holidays. After his psychiatry consult, he was admitted to inpatient psychiatry.  Bipolar Affective Disorder/ History of Anxiety/ History of Depression: Patient was off psychiatric medications and having depression and thoughts of death with thoughts of suicide. He has a history of suicide attempts. Substance abuse (+cocaine and THC) may have contributed as outpatient. Patient appeared depressed on exam and was transferred to inpatient psychiatry.  Resolved Atypical Chest Pain: Patient has a history of admission for chest pain following cocaine use at this time of year. Troponins negative. EKG with J-point elevation in 3 and AVF. Cards consulted (Dr. Katrinka Blazing) via phone in ED and no concerned for STEMI per ED physician. CTA negative for aortic dissection or other acute findings. Spoke with radiologist CT protocol was done for dissection and not PE (making it of poor quality for determining PE), but Radiologist reviewed CT chest and states it is negative for PE. Etiology could be MSK as it is reproducible on exam vs vasospasm as he is cocaine + vs GERD related, as patient has been off of his prilosec for many months and feels the worst pain on belching. Patient given  aspirin 324, nitro x2, ativan x 2 mg total, zofran  and morphine 4 mg in the ED with no relief. Patient became more comfortable with no further chest pain on protonix.   Right Hip Pain: CTA abdomen/pelvis negative, UA negative x2. Appears to be musculoskeletal given worsening with walking. May be greater trochanter bursitis? Could consider outpatient sports medicine for evaluation and possible injection if pain continues.  History of Hepatitis C: Elevated AST/ALT. No evidence of cirrhosis on CT today. Hepatitis B surface ag negative, HCV ab reactive, Hep A IgM nonreactive, Hep B C IgM nonreactive. HCV RNA quant [id number] with a quantitative log 5.88, indicative of chronic(not resolved) hepatitis C. Patient does not have cirrhosis on CT/CTA abdomen/pelvis. Alcohol cessation counseling was initiated. Consider referral to ID clinic for discussion on initiating antiviral therapy  Resolved Nausea, Vomiting, Diarrhea, Chills, Lightheadedness, Productive Cough: CXR with low lung volumes with vascular crowing and mild bibasilar opacities (likely atelectasis).  Flu and c diff negative.   History of Hypertension: BP high as 152/98 in ED. Currently 140s/80s. Pt was taking medication for HTN but noncompliant with meds outpatient. Restarted lisinopril at lower dose (20 mg daily) at discharge.  FOBT+: No hemmorhoids externally with FOBT +. He does have diverticulosis noted on CT. CBC stable with hemoglobin 14.7. No bloody BMs while here. Needs outpatient colonoscopy.   Resolved Right Arm Numbness: Odd history, with symptoms less severe with extra sleep.  Polysubstance Abuse: THC/cocaine + UDS with history of alcohol and tobacco abuse. Cessation counseling was initiated.   Discharge Vitals:   BP 144/101 mmHg  Pulse 71  Temp(Src) 97.8 F (36.6 C) (Oral)  Resp 20  Ht 5\' 7"  (1.702 m)  Wt 259 lb 0.7 oz (117.5 kg)  BMI 40.56 kg/m2  SpO2 99%  Discharge Labs:  Results for orders placed or performed during  the hospital encounter of 03/23/14 (from the past 24 hour(s))  Urinalysis, Routine w reflex microscopic     Status: None   Collection Time: 03/26/14  4:30 AM  Result Value Ref Range   Color, Urine YELLOW YELLOW   APPearance CLEAR CLEAR   Specific Gravity, Urine 1.015 1.005 - 1.030   pH 6.0 5.0 - 8.0   Glucose, UA NEGATIVE NEGATIVE mg/dL   Hgb urine dipstick NEGATIVE NEGATIVE   Bilirubin Urine NEGATIVE NEGATIVE   Ketones, ur NEGATIVE NEGATIVE mg/dL   Protein, ur NEGATIVE NEGATIVE mg/dL   Urobilinogen, UA 0.2 0.0 - 1.0 mg/dL   Nitrite NEGATIVE NEGATIVE   Leukocytes, UA NEGATIVE NEGATIVE    Signed: 03/28/14, MD 03/26/2014, 9:25 AM    Services Ordered on Discharge: inpatient psychiatry Equipment Ordered on Discharge: none

## 2014-03-27 ENCOUNTER — Ambulatory Visit (HOSPITAL_COMMUNITY)
Admission: AD | Admit: 2014-03-27 | Discharge: 2014-03-27 | Disposition: A | Discharge status: Home / Self Care | Payer: 59 | Source: Intra-hospital | Attending: Internal Medicine | Admitting: Internal Medicine

## 2014-03-27 ENCOUNTER — Encounter (HOSPITAL_COMMUNITY): Payer: Self-pay | Admitting: Psychiatry

## 2014-03-27 DIAGNOSIS — F1910 Other psychoactive substance abuse, uncomplicated: Secondary | ICD-10-CM

## 2014-03-27 DIAGNOSIS — F1490 Cocaine use, unspecified, uncomplicated: Secondary | ICD-10-CM

## 2014-03-27 DIAGNOSIS — F1099 Alcohol use, unspecified with unspecified alcohol-induced disorder: Secondary | ICD-10-CM

## 2014-03-27 DIAGNOSIS — F1290 Cannabis use, unspecified, uncomplicated: Secondary | ICD-10-CM

## 2014-03-27 DIAGNOSIS — F1920 Other psychoactive substance dependence, uncomplicated: Secondary | ICD-10-CM

## 2014-03-27 DIAGNOSIS — R45851 Suicidal ideations: Secondary | ICD-10-CM

## 2014-03-27 DIAGNOSIS — F1590 Other stimulant use, unspecified, uncomplicated: Secondary | ICD-10-CM

## 2014-03-27 DIAGNOSIS — F314 Bipolar disorder, current episode depressed, severe, without psychotic features: Principal | ICD-10-CM

## 2014-03-27 DIAGNOSIS — R112 Nausea with vomiting, unspecified: Secondary | ICD-10-CM

## 2014-03-27 LAB — BASIC METABOLIC PANEL
Anion gap: 15 (ref 5–15)
BUN: 8 mg/dL (ref 6–23)
CO2: 25 mEq/L (ref 19–32)
Calcium: 10 mg/dL (ref 8.4–10.5)
Chloride: 101 mEq/L (ref 96–112)
Creatinine, Ser: 1.03 mg/dL (ref 0.50–1.35)
GFR calc Af Amer: >90 mL/min (ref >90)
GFR calc non Af Amer: 81 mL/min — ABNORMAL LOW (ref >90)
Glucose, Bld: 118 mg/dL — ABNORMAL HIGH (ref 70–99)
Potassium: 3.9 mEq/L (ref 3.7–5.3)
Sodium: 141 mEq/L (ref 137–147)

## 2014-03-27 LAB — GLUCOSE, CAPILLARY: Glucose-Capillary: 94 mg/dL (ref 70–99)

## 2014-03-27 MED ORDER — ONDANSETRON HCL 4 MG PO TABS
4.0000 mg | ORAL_TABLET | Freq: Three times a day (TID) | ORAL | Status: DC | PRN
  Administered 2014-03-27: 4 mg via ORAL
  Filled 2014-03-27: qty 1

## 2014-03-27 MED ORDER — IBUPROFEN 200 MG PO TABS
400.0000 mg | ORAL_TABLET | ORAL | Status: DC | PRN
  Administered 2014-03-27 – 2014-03-28 (×2): 400 mg via ORAL
  Filled 2014-03-27 (×3): qty 2

## 2014-03-27 MED ORDER — ONDANSETRON 4 MG PO TBDP
4.0000 mg | ORAL_TABLET | Freq: Four times a day (QID) | ORAL | Status: DC | PRN
  Administered 2014-03-28 – 2014-04-01 (×2): 4 mg via ORAL
  Filled 2014-03-27 (×3): qty 1

## 2014-03-27 NOTE — Progress Notes (Addendum)
Pt states he does not feel good this am. He did not go to breakfast and requested a gingerale to settle his stomach.MD made aware that pt does not feel good and has multiple medical complaints. He is requesting tramadol for pain and zofran for nausea. Pt contracts for safety and denies SI and HI.Pt denies hearing any voices. He does use a cane to ambulate. Pt vomitted and complains that he does not feel good. Abd is distended with rebound tenderness in all quadrants per pt. Pt also c/o pain in both of his legs. He does have pedal pulses. Positive BS in all quadrants. MD will contact internal medicine to see the pt. Pt states he has not had much intake in two days,.pt given gatorade to drink. Pt has been encouraged to drink. He states he still does not feel good. Vital signs: sitting 137/83, 98  / lying 142/88, 72  Standing : 134/77, 118. Pt does have a positive tilt .1pm-Pt was seen by internal medicine and s/p an abd x-ray. Pt tolerated well. He has not been able to give a stool specimen.

## 2014-03-27 NOTE — H&P (Addendum)
Psychiatric Admission Assessment Adult  Patient Identification:  [name] Date of Evaluation:  03/27/2014 Chief Complaint:  "I am in pain ,I was kicked out of the hospital ,they did not do anything to treat my pain.'   History of Present Illness::[name] is a 53 year old AAM with past hx of cocaine abuse ,alcohol abuse and bipolar disorder (?substance induced), presented to the ED at [facility] with SI as well as worsening depression. Patient also had chest pain ,nausea and vomitting and was admitted to the internal medicine unit for stabilizations.  Per DC summary by Dr.Jerry Lamount Cranker -03/26/14 Patient is medically stable for discharge to behavioral health today. His chest pain resolved during this hospitalization, and he was evaluated as an inpatient by cardiology consultant Dr. Jens Som who felt that his chest pain symptoms were not consistent with cardiac pain and recommended no further cardiac evaluation. His blood pressure will need to be followed and his regimen adjusted as indicated. He should have an elective colonoscopy as outpatient to workup occult blood positive stool; he had no signs of GI bleeding and his hemoglobin is normal. He should follow-up at the [facility] for treatment of his chronic hepatitis C. The pain and tenderness over his right hip is suggestive of trochanteric bursitis, and outpatient follow-up with orthopedics or sports medicine is advisable if his symptoms do not improve with symptomatic treatment. We discussed the recommended discharge plans and follow-up with patient.    Patient seen this AM. Patient appears to be in distress. Reports abdominal pain as well as nausea and vomitting. Patient seen ,stooped over with hands to his abdomen. Patient reports pain as 10 /10 ,reports nausea ,vomitting ,loss of appetite ,unable to eat due to his nausea,reports abdominal distension as well as back pain. Patient is very irritable ,due to pain and is unable to cooperate for a  psychiatric evaluation at this time.  Will consult hospitalist and discuss patient for further management.   HPI Elements:  Elements:  Location:  depression,irritability,SI. Quality:  sadness,mood swings ,cocaine and alcohol abuse,noncompliance with medications,SI . Severity:  severe. Timing:  constant. Duration:  past 1 week. Context:  hx of bipolar disorder ? 2/2 susbtance abuse (cocaine ,etoh). Associated Signs/Synptoms: Depression Symptoms:  depressed mood, anhedonia, insomnia, psychomotor agitation, psychomotor retardation, fatigue, feelings of worthlessness/guilt, difficulty concentrating, hopelessness, suicidal thoughts without plan, suicidal attempt, anxiety, decreased appetite, (Hypo) Manic Symptoms:  Impulsivity, Irritable Mood, Labiality of Mood, Anxiety Symptoms:  Excessive Worry, Psychotic Symptoms: Unknown ,patient did not comment any - patient in pain and is unable to participate PTSD Symptoms: unknown Total Time spent with patient: 1 hour  Psychiatric Specialty Exam: Physical Exam  Constitutional: He is oriented to person, place, and time. He appears well-developed and well-nourished.  HENT:  Head: Normocephalic and atraumatic.  Eyes: Conjunctivae and EOM are normal. Pupils are equal, round, and reactive to light.  Neck: Normal range of motion. Neck supple.  Cardiovascular: Normal rate and regular rhythm.   Respiratory: Effort normal and breath sounds normal.  GI: Soft.  Musculoskeletal: Normal range of motion.  Neurological: He is alert and oriented to person, place, and time.  Skin: Skin is warm.  Psychiatric: His speech is normal. His mood appears anxious. His affect is labile. He is agitated and combative. Cognition and memory are impaired. He expresses impulsivity. He exhibits a depressed mood. He expresses suicidal ideation.    Review of Systems  Constitutional: Positive for malaise/fatigue.  HENT: Negative.   Eyes: Negative.    Respiratory:  Negative.   Cardiovascular: Negative.   Gastrointestinal: Positive for nausea, vomiting and abdominal pain.  Genitourinary: Negative.   Musculoskeletal: Positive for back pain and joint pain.  Neurological: Negative.   Psychiatric/Behavioral: Positive for depression and suicidal ideas.    Blood pressure 152/112, pulse 70, temperature 98.2 F (36.8 C), temperature source Oral, resp. rate 18, height 5\' 8"  (1.727 m), weight 118.389 kg (261 lb), SpO2 99 %.Body mass index is 39.69 kg/(m^2).  General Appearance: Disheveled  Eye Contact::  Minimal  Speech:  Slow  Volume:  Increased  Mood:  Angry, Anxious, Depressed, Hopeless and Irritable  Affect:  Labile  Thought Process:  Linear  Orientation:  Other:  to person ,place  Thought Content:  Rumination  Suicidal Thoughts:  Yes.  without intent/plan  Homicidal Thoughts:  No  Memory:  Immediate;   Fair Recent;   Fair Remote;   Fair  Judgement:  Impaired  Insight:  Lacking  Psychomotor Activity:  Decreased  Concentration:  Poor  Recall:  Poor  Fund of Knowledge:Fair  Language: Good  Akathisia:  No  Handed:  Right  AIMS (if indicated):     Assets:  Communication Skills Desire for Improvement  Sleep:  Number of Hours: 6    Musculoskeletal: Strength & Muscle Tone: within normal limits Gait & Station: normal Patient leans: N/A  Past Psychiatric History: Diagnosis:Bipolardisorder likely 2/2 substance abuse  Hospitalizations:Multiple at [facility]  Outpatient Care: Denies  Substance Abuse Care:[facility], rehab at the [name] in the past  Self-Mutilation:Denies  Suicidal Attempts: Has jumped in front of a care before  Violent Behaviors:Denies     Past Medical History:   Past Medical History  Diagnosis Date  . Arthritis     per pt rheumatoid   . Hypertension   . Anxiety   . Bipolar affective disorder   . Depression   . Alcohol abuse   . Cocaine abuse     smokes crack-cocaine  . GERD  (gastroesophageal reflux disease)   . Chronic back pain   . Tobacco abuse   . Hepatitis C   . Vitamin D deficiency   . Gout   . Hemorrhoids   . Diabetes mellitus without complication    None. Allergies:  No Known Allergies PTA Medications: Prescriptions prior to admission  Medication Sig Dispense Refill Last Dose  . acetaminophen (TYLENOL) 500 MG tablet Take 1,000 mg by mouth 3 (three) times daily.   More than a month at Unknown time  . gabapentin (NEURONTIN) 300 MG capsule Take 300 mg by mouth See admin instructions. Take 300 mg at bedtime for 14 days, then take 300 mg twice a day for 14 days, then take 300 mg three times a day   More than a month at Unknown time  . hydrocortisone (ANUSOL-HC) 2.5 % rectal cream Place 1 application rectally 2 (two) times daily.   More than a month at Unknown time  . hydrocortisone (HEMORRHOIDAL-HC) 25 MG suppository Place 25 mg rectally 2 (two) times daily.   More than a month at Unknown time  . ibuprofen (ADVIL,MOTRIN) 800 MG tablet Take 800 mg by mouth every 8 (eight) hours as needed (for pain).   More than a month at Unknown time  . lisinopril (PRINIVIL) 10 MG tablet Take 2 tablets (20 mg total) by mouth daily.   More than a month at Unknown time  . pantoprazole (PROTONIX) 40 MG tablet Take 1 tablet (40 mg total) by mouth daily.   More than a  month at Unknown time    Previous Psychotropic Medications:  Medication/Dose  abilify ,celexa,gabapentin               Substance Abuse History in the last 12 months:  Yes.    Consequences of Substance Abuse: Medical Consequences:  recent admissions ,multiple medical problems Family Consequences:  relational struggles  Social History:  reports that he has been smoking Cigarettes.  He has been smoking about 0.25 packs per day. He does not have any smokeless tobacco history on file. He reports that he drinks alcohol. He reports that he uses illicit drugs (Marijuana and Cocaine). Additional Social  History: Pain Medications: no medications taken in months Prescriptions: none Over the Counter: none History of alcohol / drug use?: Yes Longest period of sobriety (when/how long): couple years for [facility];  one year sober for alcohol Negative Consequences of Use: Surveyor, quantity, Work / School Withdrawal Symptoms: Irritability                    Current Place of Residence: [place], [place]   Place of Birth: [place], [place]  Family Members- mother  Marital Status: Single  Children:1 Sons: 0 Daughters: 1  Relationships: Single  Education: No high school diploma  Educational Problems/Performance: Did not complete high school  Religious Beliefs/Practices: NA  History of Abuse (Emotional/Phsycial/Sexual): None reported  Occupational Experiences: English as a second language teacher History: None.  Legal History:unknown  Hobbies/Interests:unknown   Family History:   Family History  Problem Relation Age of Onset  . Hypertension Brother   . Diabetes Brother   . Hypertension Mother   . Diabetes Mother   . Arthritis Mother   . Other Sister     bone disease    Results for orders placed or performed during the hospital encounter of 03/26/14 (from the past 72 hour(s))  Glucose, capillary     Status: None   Collection Time: 03/27/14  9:55 AM  Result Value Ref Range   Glucose-Capillary 94 70 - 99 mg/dL   Psychological Evaluations:denies  Assessment:  Patient is a 52 year old AAM ,with past hx of bipolar disorder likely secondary to cocaine as well as ETOH abuse. Patient today appears to be in severe abdominal pain,seen stooped over ,rates pain as 10/10 ,has nausea and vomiting. Patient was recently discharged from IM service ,but patient continues to be symptomatic. Willl consult hospitalist to discuss.   DSM5: Primary Psychiatric Diagnosis: Substance induced Bipolar and related disorder (cocaine ,ETOH) versus Bipolar disorder type I,depressed   Secondary  Psychiatric Diagnosis: Stimulant use disorder ,severe ,cocaine type Alcohol use disorder ,severe Cannabis use disorder,severe   Non Psychiatric Diagnosis: See PMH   Past Medical History  Diagnosis Date  . Arthritis     per pt rheumatoid   . Hypertension   . Anxiety   . Bipolar affective disorder   . Depression   . Alcohol abuse   . Cocaine abuse     smokes crack-cocaine  . GERD (gastroesophageal reflux disease)   . Chronic back pain   . Tobacco abuse   . Hepatitis C   . Vitamin D deficiency   . Gout   . Hemorrhoids   . Diabetes mellitus without complication      Treatment Plan/Recommendations:  Patient will benefit from inpatient treatment and stabilization.  Estimated length of stay is 5-7 days.  Reviewed past medical records,treatment plan.  Will continue to monitor vitals ,medication compliance and treatment side effects while patient is here.  Will continue his medications  for now,will not start any additional medications 2/2 his vomiting and abdominal pain.   Patient is in pain ,seen stooped over ,with hands on his abdomen ,has vomiting as well as nausea. Patient is unable to cooperate with a complete psychiatric evaluation.Hospitalist consult placed for evaluation.    Reviewed labs ,will order as needed.  CSW will start working on disposition.  Patient to participate in therapeutic milieu .       Treatment Plan Summary: Daily contact with patient to assess and evaluate symptoms and progress in treatment Medication management Current Medications:  Current Facility-Administered Medications  Medication Dose Route Frequency Provider Last Rate Last Dose  . alum & mag hydroxide-simeth (MAALOX/MYLANTA) 200-200-20 MG/5ML suspension 30 mL  30 mL Oral Q4H PRN Nanine Means, NP      . gabapentin (NEURONTIN) capsule 300 mg  300 mg Oral TID Nanine Means, NP   300 mg at 03/27/14 0800  . ibuprofen (ADVIL,MOTRIN) tablet 400 mg  400 mg Oral Q4H PRN Jomarie Longs, MD       . lisinopril (PRINIVIL,ZESTRIL) tablet 20 mg  20 mg Oral Daily Nanine Means, NP   20 mg at 03/27/14 0800  . magnesium hydroxide (MILK OF MAGNESIA) suspension 30 mL  30 mL Oral Daily PRN Nanine Means, NP      . ondansetron [facility]) tablet 4 mg  4 mg Oral Q8H PRN Lindwood Qua, NP   4 mg at 03/27/14 1914  . pantoprazole (PROTONIX) EC tablet 40 mg  40 mg Oral Daily Nanine Means, NP   40 mg at 03/27/14 0800    Observation Level/Precautions:  Fall 15 minute checks  Laboratory:  AS NEEDED  Psychotherapy:  INDIVIDUAL AND GROUP  Medications:  AS ABOVE  Consultations:  WILL CONSULT HOSPITALIST  Discharge Concerns:  STABILITY AND SAFETY  I certify that inpatient services furnished can reasonably be expected to improve the patient's condition.   Wylee Ogden md 12/18/201510:28 AM

## 2014-03-27 NOTE — Progress Notes (Signed)
D: Pt has anxious affect and mood.  Pt reports passive SI, stating "that's why I'm here."  Pt verbally contracted for safety.  Pt denies HI.  Pt forwards little information and tried to walk away from Probation officer when Probation officer was talking with pt.  Pt denies hallucinations.  Pt walks with a cane and has an unsteady gait.   A: Safety maintained.  Met with pt 1:1 and offered support and encouragement. R: Pt reported that he will notify writer of needs and concerns.  He verbally contracted for safety.  Will continue to monitor and assess for safety.

## 2014-03-27 NOTE — BHH Group Notes (Signed)
[facility] LCSW Aftercare Discharge Planning Group Note   03/27/2014 9:20 AM  Participation Quality:  Invited Declined to attend    Ida Rogue

## 2014-03-27 NOTE — Clinical Social Work Note (Signed)
Pt unwilling to cooperate with full PSA in AM.  Went to medical facility in PM.

## 2014-03-27 NOTE — BHH Counselor (Signed)
Adult Comprehensive Assessment  Patient ID: KAYIN OSMENT, male   DOB: June 17, 1960, 53 y.o.   MRN: [id number]  Information Source: Information source: Patient  Current Stressors:  Employment / Job issues: Disability Family Relationships: "They don't support my menatl health needs.  They tell me to just get over it." Financial / Lack of resources (include bankruptcy): Fixed income. Physical health (include injuries & life threatening diseases): Multiple medical issues Substance abuse: Drinking and drugging for the past 2 months Bereavement / Loss: "the holidays are hard."  Lost a daughter 2 years ago to SIDS, fiance 2-3 years ago.  Living/Environment/Situation:  Living Arrangements: Alone Living conditions (as described by patient or guardian): good  "It's good to be off the streets." How long has patient lived in current situation?: 9 mos What is atmosphere in current home: Comfortable  Family History:  Marital status: Single Does patient have children?: Yes How many children?: 1 How is patient's relationship with their children?: infant daughter died 5 years ago  Childhood History:  By whom was/is the patient raised?: Mother Additional childhood history information: No relationship with father Description of patient's relationship with caregiver when they were a child: good Patient's description of current relationship with people who raised him/her: good-it could be better Does patient have siblings?: Yes Number of Siblings: 3 Description of patient's current relationship with siblings: no relationship Did patient suffer any verbal/emotional/physical/sexual abuse as a child?: No Did patient suffer from severe childhood neglect?: No Has patient ever been sexually abused/assaulted/raped as an adolescent or adult?: No Was the patient ever a victim of a crime or a disaster?: No Witnessed domestic violence?: No Has patient been effected by domestic violence as an adult?:  No  Education:  Highest grade of school patient has completed: 2 years college Currently a Consulting civil engineer?: No Learning disability?: No  Employment/Work Situation:   Employment situation: On disability Why is patient on disability: mental health How long has patient been on disability: "couple of years" What is the longest time patient has a held a job?: on and off for a couple of years 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 Has patient ever been in the [employer]?: 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) Has patient ever served in combat?: No  Financial Resources:   Financial resources: Insurance claims handler Does patient have a Lawyer or guardian?: No  Alcohol/Substance Abuse:   What has been your use of drugs/alcohol within the last 12 months?: Alcohol and cocaine Alcohol/Substance Abuse Treatment Hx: Past Tx, Inpatient If yes, describe treatment: [facility], [facility] Has alcohol/substance abuse ever caused legal problems?: No  Social Support System:   Conservation officer, nature Support System: Fair Museum/gallery exhibitions officer System: family when they want to Type of faith/religion: NA How does patient's faith help to cope with current illness?: NA  Leisure/Recreation:   Leisure and Hobbies: watching movies  Strengths/Needs:   What things does the patient do well?: people person In what areas does patient struggle / problems for patient: depression, health issues  Discharge Plan:   Does patient have access to transportation?: Yes Will patient be returning to same living situation after discharge?: Yes Currently receiving community mental health services: No If no, would patient like referral for services when discharged?: Yes (What county?) [name]) Does patient have financial barriers related to discharge medications?: No  Summary/Recommendations:   Summary and Recommendations (to be completed by the evaluator): Keats is a 53 YO AA male  who is  here as a result of stopping his meds about 3 months ago, and then relapsing 2 months ago.  He finds the holiday season difficult in general because of significant losses of loved ones.  His relationship with his mother and brother is better than it used to be, but still not where he would like it to be.  He plans to return home at d/c, and follow up at the [facility].  He can benefit from crises stabilization, medication managment, therapeutic milieu and referral for services.  Daryel Gerald B. 03/27/2014

## 2014-03-27 NOTE — BHH Suicide Risk Assessment (Signed)
   Nursing information obtained from:  Patient Demographic factors:  Male, Low socioeconomic status Current Mental Status:  Suicidal ideation indicated by patient Loss Factors:  Financial problems / change in socioeconomic status Historical Factors:  Impulsivity Risk Reduction Factors:    Total Time spent with patient: 30 minutes  CLINICAL FACTORS:   Alcohol/Substance Abuse/Dependencies More than one psychiatric diagnosis Unstable or Poor Therapeutic Relationship Previous Psychiatric Diagnoses and Treatments Medical Diagnoses and Treatments/Surgeries  Psychiatric Specialty Exam: Physical Exam Please see H&P.   ROS  Blood pressure 152/112, pulse 70, temperature 98.2 F (36.8 C), temperature source Oral, resp. rate 18, height 5\' 8"  (1.727 m), weight 118.389 kg (261 lb), SpO2 99 %.Body mass index is 39.69 kg/(m^2).  Please see H&P for MSE.    SUICIDE RISK:   Moderate:  Frequent suicidal ideation with limited intensity, and duration, some specificity in terms of plans, no associated intent, good self-control, limited dysphoria/symptomatology, some risk factors present, and identifiable protective factors, including available and accessible social support.  PLAN OF CARE:Please see H&P.   I certify that inpatient services furnished can reasonably be expected to improve the patient's condition.  Little Bashore md 03/27/2014, 9:51 AM

## 2014-03-27 NOTE — Consult Note (Signed)
[facility] Medical Consultation  [name] DTO:[id number] DOB: June 01, 1960 DOA: 03/26/2014 PCP: Default, Provider, MD   Requesting physician: Dr. Elna Breslow (psychiatry)  Date of consultation: 03/27/2014  Reason for consultation: abdominal pain, nausea, vomiting, diarrhea   Impression/Recommendations Active Problems:   Anxiety   Bipolar affective disorder   Polysubstance abuse   Polysubstance dependence   Bipolar affective disorder, depressed, severe   HPI:  53 year old male with history of cocaine abuse, alcohol abuse and bipolar disorder who was admitted to [facility] 03/26/2014 with depression and suicidal ideations. [facility] consulted because of complaints of nausea, vomiting and diarrhea. Patient reports ongoing nausea, vomiting and diarrhea for past 1-2 weeks prior to this admission. He reported mid abdominal discomfort, cramp like pain intermittent and not relieved with rest. No fevers or chills. No reports of bleeding. No GU complaints.   Assessment and Plan:  Abdominal pain, nausea, vomiting, diarrhea - i think this may be withdrawal from substance abuse rather than an infectious process. Will obtain stool for C.diff and GI pathogen panel to rule out potential infectious source. - abd x ray did not show acute findings. - may use zofran as needed to help relieve N/V  I will followup again tomorrow. Please contact me if I can be of assistance in the meanwhile. Thank you for this consultation.  Manson Passey [facility] [phone]  Review of Systems:  Constitutional: Negative for fever, chills, diaphoresis, activity change, appetite change and fatigue.  HENT: Negative for ear pain, nosebleeds, congestion, facial swelling, rhinorrhea, neck pain, neck stiffness and ear discharge.   Eyes: Negative for pain, discharge, redness, itching and visual disturbance.  Respiratory: Negative for cough, choking, chest tightness, shortness of breath, wheezing and stridor.   Cardiovascular: Negative for  chest pain, palpitations and leg swelling.  Gastrointestinal: Negative for abdominal distention.  Genitourinary: Negative for dysuria, urgency, frequency, hematuria, flank pain, decreased urine volume, difficulty urinating and dyspareunia.  Musculoskeletal: Negative for back pain, joint swelling, arthralgias and gait problem.  Neurological: Negative for dizziness, tremors, seizures, syncope, facial asymmetry, speech difficulty, weakness, light-headedness, numbness and headaches.  Hematological: Negative for adenopathy. Does not bruise/bleed easily.  Psychiatric/Behavioral: Negative for hallucinations, behavioral problems, confusion, dysphoric mood, decreased concentration and agitation.     Past Medical History  Diagnosis Date  . Arthritis     per pt rheumatoid   . Hypertension   . Anxiety   . Bipolar affective disorder   . Depression   . Alcohol abuse   . Cocaine abuse     smokes crack-cocaine  . GERD (gastroesophageal reflux disease)   . Chronic back pain   . Tobacco abuse   . Hepatitis C   . Vitamin D deficiency   . Gout   . Hemorrhoids   . Diabetes mellitus without complication    History reviewed. No pertinent past surgical history. Social History:  reports that he has been smoking Cigarettes.  He has been smoking about 0.25 packs per day. He does not have any smokeless tobacco history on file. He reports that he drinks alcohol. He reports that he uses illicit drugs (Marijuana and Cocaine).  No Known Allergies Family History  Problem Relation Age of Onset  . Hypertension Brother   . Diabetes Brother   . Hypertension Mother   . Diabetes Mother   . Arthritis Mother   . Other Sister     bone disease    Prior to Admission medications   Medication Sig Start Date End Date Taking? Authorizing Provider  acetaminophen (TYLENOL) 500 MG tablet Take 1,000 mg by mouth 3 (three) times daily.    Historical Provider, MD  gabapentin (NEURONTIN) 300 MG capsule Take 300 mg by mouth  See admin instructions. Take 300 mg at bedtime for 14 days, then take 300 mg twice a day for 14 days, then take 300 mg three times a day    Historical Provider, MD  hydrocortisone (ANUSOL-HC) 2.5 % rectal cream Place 1 application rectally 2 (two) times daily.    Historical Provider, MD  hydrocortisone (HEMORRHOIDAL-HC) 25 MG suppository Place 25 mg rectally 2 (two) times daily.    Historical Provider, MD  ibuprofen (ADVIL,MOTRIN) 800 MG tablet Take 800 mg by mouth every 8 (eight) hours as needed (for pain).    Historical Provider, MD  lisinopril (PRINIVIL) 10 MG tablet Take 2 tablets (20 mg total) by mouth daily. 03/26/14   Dionne Ano, MD  pantoprazole (PROTONIX) 40 MG tablet Take 1 tablet (40 mg total) by mouth daily. 03/26/14   Dionne Ano, MD   Physical Exam: Blood pressure 152/97, pulse 85, temperature 98.1 F (36.7 C), temperature source Oral, resp. rate 18, height 5\' 8"  (1.727 m), weight 118.389 kg (261 lb), SpO2 99 %. Filed Vitals:   03/27/14 1701  BP: 152/97  Pulse: 85  Temp:   Resp:    Physical Exam  Constitutional: Appears well-developed and well-nourished. No distress.  HENT: Normocephalic. External right and left ear normal. Oropharynx is clear and moist.  Eyes: Conjunctivae and EOM are normal. PERRLA, no scleral icterus.  Neck: Normal ROM. Neck supple. No JVD. No tracheal deviation. No thyromegaly.  CVS: RRR, S1/S2 +, no murmurs, no gallops, no carotid bruit.  Pulmonary: Effort and breath sounds normal, no stridor, rhonchi, wheezes, rales.  Abdominal: Soft. BS +,  no distension, tenderness, rebound or guarding.  Musculoskeletal: Normal range of motion. No edema and no tenderness.  Lymphadenopathy: No lymphadenopathy noted, cervical, inguinal. Neuro: Alert. Normal reflexes, muscle tone coordination. No cranial nerve deficit. Skin: Skin is warm and dry. No rash noted. Not diaphoretic. No erythema. No pallor.  Psychiatric: Normal mood and affect. Behavior, judgment,  thought content normal.     Labs on Admission:  Basic Metabolic Panel:  Recent Labs Lab 03/23/14 0943 03/23/14 0957 03/23/14 1532 03/24/14 0103  NA 139 140  --  137  K 3.5* 3.3*  --  3.7  CL 103 105  --  102  CO2 21  --   --  20  GLUCOSE 94 96  --  90  BUN 8 7  --  9  CREATININE 0.99 1.10  --  1.02  CALCIUM 9.6  --   --  9.3  MG  --   --  1.9  --    Liver Function Tests:  Recent Labs Lab 03/23/14 0943 03/24/14 0103  AST 185* 160*  ALT 373* 356*  ALKPHOS 58 55  BILITOT 0.6 0.4  PROT 7.9 7.4  ALBUMIN 4.1 3.8    Recent Labs Lab 03/23/14 0943  LIPASE 39   No results for input(s): AMMONIA in the last 168 hours. CBC:  Recent Labs Lab 03/23/14 0943 03/23/14 0957 03/24/14 0103  WBC 6.8  --  7.9  NEUTROABS 4.7  --  4.8  HGB 15.5 16.7 14.7  HCT 44.0 49.0 42.3  MCV 80.4  --  80.1  PLT 233  --  260   Cardiac Enzymes:  Recent Labs Lab 03/23/14 1335 03/23/14 1935 03/24/14 0103  TROPONINI <0.30 <0.30 <0.30  BNP: Invalid input(s): POCBNP CBG:  Recent Labs Lab 03/27/14 0955  GLUCAP 94    Radiological Exams on Admission: Dg Abd 1 View 03/27/2014   Normal bowel gas pattern.   Electronically Signed   By: Ulyses Southward M.D.   On: 03/27/2014 16:14     Time spent: 35 minutes   Manson Passey [facility] Pager [phone]  If 7PM-7AM, please contact night-coverage [email or web address] Password [email or web address] 03/27/2014, 5:55 PM

## 2014-03-27 NOTE — Tx Team (Signed)
  Interdisciplinary Treatment Plan Update   Date Reviewed:  03/27/2014  Time Reviewed:  8:37 AM  Progress in Treatment:   Attending groups: No Participating in groups: No Taking medication as prescribed: Yes  Tolerating medication: Yes Family/Significant other contact made: no  Patient understands diagnosis: Yes AEB asking for help with primarily pain, but also with depression secondary to substance abuse Discussing patient identified problems/goals with staff: Yes  See initial care plan Medical problems stabilized or resolved: Yes Denies suicidal/homicidal ideation: Yes  In tx team Patient has not harmed self or others: Yes  For review of initial/current patient goals, please see plan of care.  Estimated Length of Stay:  4-5 days  Reason for Continuation of Hospitalization: Depression Medical Issues Medication stabilization Suicidal ideation  New Problems/Goals identified:  N/A  Discharge Plan or Barriers:   unknown-will not engage in discussion  Additional Comments:  The patient stated he is having suicidal ideations and came to the ED for chest pain and to see a psychiatrist. He was also using cocaine and marijuana prior to admission, relapsed two weeks ago. Kaitlin is using crack/cocaine, alcohol, and marijuana every other day Multiple rehabs and detox facilities in the past. Herve endorses depression with crying spells and isolation. He has had suicide attempts in the past by overdoses after his daughter died and later his fiance. 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 Dasean reports not taking his medical and psychiatric medications for two months because he had been feeling better but has not in the past two weeks. He was on Abilify through the [name].  Attendees:  Signature: Ivin Booty, MD 03/27/2014 8:37 AM   Signature: Richelle Ito, LCSW 03/27/2014 8:37 AM  Signature:  03/27/2014 8:37 AM  Signature: Rodman Key, RN 03/27/2014 8:37 AM  Signature:  03/27/2014  8:37 AM  Signature:  03/27/2014 8:37 AM  Signature:   03/27/2014 8:37 AM  Signature:    Signature:    Signature:    Signature:    Signature:    Signature:      Scribe for Treatment Team:   Richelle Ito, LCSW  03/27/2014 8:37 AM

## 2014-03-27 NOTE — [facility] Group Notes (Signed)
Adult Psychoeducational Group Note  Date:  03/27/2014 Time:  11:21 PM  Group Topic/Focus:  Wrap-Up Group:   The focus of this group is to help patients review their daily goal of treatment and discuss progress on daily workbooks.  Participation Level:  Minimal  Participation Quality:  Appropriate  Affect:  Appropriate  Cognitive:  Appropriate  Insight: Good  Engagement in Group:  Limited  Modes of Intervention:  Discussion  Additional Comments:  [name] stated that his day sucked.  He was in pain and has some x rays done.  He stated that his pain has gone down.  His goal was to interact more with people.  He also expressed that it feels like home here because there is no judging and he can get himself together here.  [name] A 03/27/2014, 11:21 PM

## 2014-03-27 NOTE — Plan of Care (Signed)
Problem: Diagnosis: Increased Risk For Suicide Attempt Goal: STG-Patient Will Attend All Groups On The Unit Outcome: Progressing Pt attended evening group (karaoke) on 03/26/14.

## 2014-03-27 NOTE — [facility] Group Notes (Signed)
Psychoeducational Group Note  Date:  03/27/2014 Time:  1005  Group Topic/Focus:  Relapse Prevention Planning:   The focus of this group is to define relapse and discuss the need for planning to combat relapse.  Participation Level: Did Not Attend  Participation Quality:  Not Applicable  Affect:  Not Applicable  Cognitive:  Not Applicable  Insight:  Not Applicable  Engagement in Group: Not Applicable  Additional Comments:  Pt didn't attend group  [name] 03/27/2014, 3:57 PM

## 2014-03-28 DIAGNOSIS — R45850 Homicidal ideations: Secondary | ICD-10-CM

## 2014-03-28 DIAGNOSIS — Y909 Presence of alcohol in blood, level not specified: Secondary | ICD-10-CM

## 2014-03-28 DIAGNOSIS — F1994 Other psychoactive substance use, unspecified with psychoactive substance-induced mood disorder: Secondary | ICD-10-CM

## 2014-03-28 MED ORDER — TRAMADOL HCL 50 MG PO TABS
50.0000 mg | ORAL_TABLET | Freq: Four times a day (QID) | ORAL | Status: DC | PRN
  Administered 2014-03-28 – 2014-04-03 (×13): 50 mg via ORAL
  Filled 2014-03-28 (×13): qty 1

## 2014-03-28 MED ORDER — TRAMADOL HCL 50 MG PO TABS
100.0000 mg | ORAL_TABLET | Freq: Four times a day (QID) | ORAL | Status: DC | PRN
  Administered 2014-03-28: 100 mg via ORAL

## 2014-03-28 MED ORDER — TRAMADOL HCL 50 MG PO TABS
ORAL_TABLET | ORAL | Status: AC
  Filled 2014-03-28: qty 2

## 2014-03-28 MED ORDER — ARIPIPRAZOLE 5 MG PO TABS
ORAL_TABLET | ORAL | Status: AC
  Filled 2014-03-28: qty 1

## 2014-03-28 MED ORDER — METHOCARBAMOL 750 MG PO TABS
750.0000 mg | ORAL_TABLET | Freq: Three times a day (TID) | ORAL | Status: DC | PRN
  Administered 2014-03-28 – 2014-04-06 (×19): 750 mg via ORAL
  Filled 2014-03-28 (×6): qty 1
  Filled 2014-03-28: qty 6
  Filled 2014-03-28 (×12): qty 1

## 2014-03-28 MED ORDER — ZOLPIDEM TARTRATE 10 MG PO TABS
10.0000 mg | ORAL_TABLET | Freq: Every evening | ORAL | Status: DC | PRN
  Administered 2014-03-28 – 2014-04-05 (×9): 10 mg via ORAL
  Filled 2014-03-28 (×9): qty 1

## 2014-03-28 MED ORDER — METHOCARBAMOL 750 MG PO TABS
ORAL_TABLET | ORAL | Status: AC
  Filled 2014-03-28: qty 1

## 2014-03-28 MED ORDER — ARIPIPRAZOLE 5 MG PO TABS
5.0000 mg | ORAL_TABLET | Freq: Every day | ORAL | Status: DC
  Administered 2014-03-28 – 2014-03-29 (×2): 5 mg via ORAL
  Filled 2014-03-28 (×4): qty 1

## 2014-03-28 NOTE — Progress Notes (Signed)
Patient became quite agitated prior to lunch stating, "I want to leave! I'm not getting the care I need. Hasn't it been 72 hours? I need tramadol and I need [facility] med[facility]." Conferred with Laban Emperor NP who had just interviewed patient. Orders received and patient was medicated for pain. Explained to patient he would need to ask for prn's and the times when he could. Reminded patient about need for stool samples and hat provided for toilet with instructions. Patient has been much less irritable since medicating for pain. Has not produced any results for samples. Lawrence Marseilles

## 2014-03-28 NOTE — Progress Notes (Signed)
Patient sitting in hallway, affect blank, flat. He continues to report R flank pain as well as nausea and was medicated prior to the start of this writer's shift. States he is feeling "a little bit better" though pain score remains a 7/10. Mood is irritable and he forwards minimal information. Ambulating with cane. Medicated per orders. Reassurance and support offered. Fall precautions reviewed and pt reminded stool sample is still needed. Pt verbalized understanding. He endorses passive SI but verbally contracts for safety. No HI/AVH. Will to continue to monitor closely. Lawrence Marseilles

## 2014-03-28 NOTE — [facility] Group Notes (Signed)
[facility] Group Notes:  (Nursing/MHT/Case Management/Adjunct)  Date:  03/28/2014  Time:  9:30am  Type of Therapy:  Nurse Education  Participation Level:  Minimal  Participation Quality:  Resistant  Affect:  Irritable  Cognitive:  Oriented  Insight:  Lacking  Engagement in Group:  Limited  Modes of Intervention:  Discussion, Education and Support  Summary of Progress/Problems: Patient resistant to group on healthy coping skills stating he needs better pain management not coping skills.  Lawrence Marseilles 03/28/2014, 1:08 PM

## 2014-03-28 NOTE — Progress Notes (Signed)
[facility] Group Notes:  (Nursing/MHT/Case Management/Adjunct)  Date:  03/28/2014  Time:  11:32 PM  Type of Therapy:  Group Therapy  Participation Level:  Active  Participation Quality:  Appropriate  Affect:  Appropriate  Cognitive:  Appropriate  Insight:  Appropriate  Engagement in Group:  Engaged  Modes of Intervention:  Socialization and Support  Summary of Progress/Problems: Pt. Was engaged in group activity and discussion. Pt. Was able to identify a healthy coping skill. Pt. Stated watching sports was a healthy coping skill.  Sondra Come 03/28/2014, 11:32 PM

## 2014-03-28 NOTE — Progress Notes (Signed)
Patient ID: [name], male   DOB: [birth year], 53 y.o.   MRN: [id number] D)  Has been in the dayroom this evening, attended group and came to the med window shortly after , asking about hs meds and when he could take them. Affect is flat, depressed.  Stated was tired and has had a long day, still having discomfort in rt flank, requested ginger ale and motrin, went to bed shortly after.   A)  Will continue to monitor for safety, continue POC R)  Safety maintained.

## 2014-03-28 NOTE — Progress Notes (Signed)
Patient ID: [name], male   DOB: 07-22-1960, 53 y.o.   MRN: [id number] D)  Has been visible in the milieu this evening, attended group and has been interacting appropriately with staff and peers.  States is feeling better this evening, the meds ordered today, ultram and robaxin have helped his pain and has been able to be more mobile and participate more in the program.  Stated he was rude and awful with his NP today, remorseful, wants to apologize.  Has been pleasant and cooperative this evening. A)  Will continue to monitor for safety, continue POC R)  Safety maintained.

## 2014-03-28 NOTE — Progress Notes (Signed)
[facility] MD Progress Note  03/28/2014 1:05 PM [name]  MRN:  [id number] Subjective:   Patient states "I am not doing well at all. I am all screwed up in the head with suicidal and homicidal thoughts. I have a gun to shoot myself outside of here. Or I could jump in front of a car. I am very depressed. I do not have my pain medications from the [place]. I am not on any medications to help me. I was taking all my medications from the [place]. I would like to be started on Ultram and Ambien."  Objective:  Patient is seen and chart is reviewed. The patient reports increased symptoms of depression including suicidal thoughts. He is very frustrated over his current medication regimen. Patient was last inpatient at [facility] in 2014. Reports that he receives all of his medications currently through the [place]. Ronan became very agitated and verbally aggressive when he was requesting pain medications. His urine drug screen on admission was only positive for cocaine and marijuana. Patient reported to Probation officer that he was taking all his medications prescribed at the [place] including oxycodone. This contradicts information documented in epic that he has not been taking his medications regularly. Consulted with Dr. Sabra Heck who is familiar with patient from past admissions to [facility] regarding appropriate medication regimen for patient. The patient will be started on Abilify, Robaxin, Ultram, and Ambien.   Diagnosis:   DSM5:  Total Time spent with patient: 20 minutes Primary Psychiatric Diagnosis: Substance induced Bipolar and related disorder (cocaine ,ETOH) versus Bipolar disorder type I,depressed  Secondary Psychiatric Diagnosis: Stimulant use disorder ,severe ,cocaine type Alcohol use disorder ,severe Cannabis use disorder,severe  Non Psychiatric Diagnosis: See PMH  Past Medical History  Diagnosis Date  . Arthritis     per pt rheumatoid   . Hypertension   . Anxiety   . Bipolar affective disorder   .  Depression   . Alcohol abuse   . Cocaine abuse     smokes crack-cocaine  . GERD (gastroesophageal reflux disease)   . Chronic back pain   . Tobacco abuse   . Hepatitis C   . Vitamin D deficiency   . Gout   . Hemorrhoids   . Diabetes mellitus without complication    ADL's:  Intact  Sleep: Fair  Appetite:  Good  Suicidal Ideation:  Plan:  "Put gun in my mouth."  Intent:  Present Means:  Currently safety maintained in the hospital  Homicidal Ideation:  Plan:  "Strangle someone."  Intent:  Currently contracts  Means:  Safety maintained in the hospital AEB (as evidenced by):  Psychiatric Specialty Exam: Physical Exam  Review of Systems  Constitutional: Negative for fever, chills, weight loss, malaise/fatigue and diaphoresis.  HENT: Negative for congestion, ear discharge, ear pain, hearing loss, nosebleeds, sore throat and tinnitus.   Eyes: Negative for blurred vision, double vision, photophobia, pain, discharge and redness.  Respiratory: Negative for cough, hemoptysis, sputum production, shortness of breath, wheezing and stridor.   Cardiovascular: Negative for chest pain, palpitations, orthopnea, claudication, leg swelling and PND.  Gastrointestinal: Negative for heartburn, nausea, vomiting, abdominal pain, diarrhea, constipation, blood in stool and melena.  Genitourinary: Negative for dysuria, urgency, frequency, hematuria and flank pain.  Musculoskeletal: Positive for myalgias and joint pain.  Skin: Negative for itching and rash.  Neurological: Negative for dizziness, tingling, tremors, sensory change, speech change, focal weakness, seizures, loss of consciousness, weakness and headaches.  Endo/Heme/Allergies: Negative for environmental allergies and polydipsia. Does not bruise/bleed  easily.  Psychiatric/Behavioral: Positive for depression, suicidal ideas and substance abuse. The patient is nervous/anxious and has insomnia.     Blood  pressure 150/104, pulse 87, temperature 97.6 F (36.4 C), temperature source Oral, resp. rate 18, height _0  (1.727 m), weight 118.389 kg (261 lb), SpO2 99 %.Body mass index is 39.69 kg/(m^2).  General Appearance: Disheveled  Eye Contact::  Good  Speech:  Clear and Coherent and Pressured  Volume:  Increased  Mood:  Angry  Affect:  Labile  Thought Process:  Goal Directed and Intact  Orientation:  Full (Time, Place, and Person)  Thought Content:  Rumination  Suicidal Thoughts:  Yes.  with intent/plan  Homicidal Thoughts:  Yes.  without intent/plan  Memory:  Immediate;   Good Recent;   Good Remote;   Good  Judgement:  Poor  Insight:  Shallow  Psychomotor Activity:  Increased and Restlessness  Concentration:  Fair  Recall:  Good  Fund of Knowledge:Good  Language: Good  Akathisia:  No  Handed:  Right  AIMS (if indicated):     Assets:  Communication Skills Desire for Improvement Leisure Time Resilience  Sleep:  Number of Hours: 6   Musculoskeletal: Strength & Muscle Tone: within normal limits Gait & Station: normal Patient uses a cane to ambulate due to arthritis pain in his hips.  Patient leans: N/A  Current Medications: Current Facility-Administered Medications  Medication Dose Route Frequency Provider Last Rate Last Dose  . alum & mag hydroxide-simeth (MAALOX/MYLANTA) 200-200-20 MG/5ML suspension 30 mL  30 mL Oral Q4H PRN Waylan Boga, NP      . ARIPiprazole (ABILIFY) tablet 5 mg  5 mg Oral Daily Elmarie Shiley, NP   5 mg at 03/28/14 1258  . gabapentin (NEURONTIN) capsule 300 mg  300 mg Oral TID Waylan Boga, NP   300 mg at 03/28/14 1212  . ibuprofen (ADVIL,MOTRIN) tablet 400 mg  400 mg Oral Q4H PRN Ursula Alert, MD   400 mg at 03/28/14 0650  . lisinopril (PRINIVIL,ZESTRIL) tablet 20 mg  20 mg Oral Daily Waylan Boga, NP   20 mg at 03/28/14 0803  . magnesium hydroxide (MILK OF MAGNESIA) suspension 30 mL  30 mL Oral Daily PRN Waylan Boga, NP      . methocarbamol (ROBAXIN)  tablet 750 mg  750 mg Oral Q8H PRN Elmarie Shiley, NP   750 mg at 03/28/14 1255  . ondansetron (ZOFRAN-ODT) disintegrating tablet 4 mg  4 mg Oral Q6H PRN Robbie Lis, MD   4 mg at 03/28/14 [phone]  . pantoprazole (PROTONIX) EC tablet 40 mg  40 mg Oral Daily Waylan Boga, NP   40 mg at 03/28/14 0804  . traMADol (ULTRAM) tablet 100 mg  100 mg Oral Q6H PRN Elmarie Shiley, NP   100 mg at 03/28/14 1256  . zolpidem (AMBIEN) tablet 10 mg  10 mg Oral QHS PRN Elmarie Shiley, NP        Lab Results:  Results for orders placed or performed during the hospital encounter of 03/26/14 (from the past 48 hour(s))  Glucose, capillary     Status: None   Collection Time: 03/27/14  9:55 AM  Result Value Ref Range   Glucose-Capillary 94 70 - 99 mg/dL  Basic metabolic panel     Status: Abnormal   Collection Time: 03/27/14  7:25 PM  Result Value Ref Range   Sodium 141 137 - 147 mEq/L   Potassium 3.9 3.7 - 5.3 mEq/L   Chloride 101 96 - 112 mEq/L  CO2 25 19 - 32 mEq/L   Glucose, Bld 118 (H) 70 - 99 mg/dL   BUN 8 6 - 23 mg/dL   Creatinine, Ser 1.03 0.50 - 1.35 mg/dL   Calcium 10.0 8.4 - 10.5 mg/dL   GFR calc non Af Amer 81 (L) >90 mL/min   GFR calc Af Amer >90 >90 mL/min    Comment: (NOTE) The eGFR has been calculated using the CKD EPI equation. This calculation has not been validated in all clinical situations. eGFR's persistently <90 mL/min signify possible Chronic Kidney Disease.    Anion gap 15 5 - 15    Comment: Performed at [facility]    Physical Findings: AIMS: Facial and Oral Movements Muscles of Facial Expression: None, normal Lips and Perioral Area: None, normal Jaw: None, normal Tongue: None, normal,Extremity Movements Upper (arms, wrists, hands, fingers): None, normal Lower (legs, knees, ankles, toes): None, normal, Trunk Movements Neck, shoulders, hips: None, normal, Overall Severity Severity of abnormal movements (highest score from questions above): None,  normal Incapacitation due to abnormal movements: None, normal Patient's awareness of abnormal movements (rate only patient's report): No Awareness, Dental Status Current problems with teeth and/or dentures?: Yes Does patient usually wear dentures?: No  CIWA:  CIWA-Ar Total: 3 COWS:  COWS Total Score: 3  Treatment Plan Summary: Daily contact with patient to assess and evaluate symptoms and progress in treatment Medication management  Plan: 1. Continue crisis management and stabilization.  2. Medication management:  -Continue Neurontin 300 mg TID for chronic pain/mood control -Start Abilify 5 mg daily for depression. -Start Ultram 50 mg every six hours prn pain. -Start Robaxin 750 mg every eight hours prn muscle pain. -Start Ambien 10 mg hs prn insomnia.  3. Encouraged patient to attend groups and participate in group counseling sessions and activities.  4. Discharge plan in progress.  5. Continue current treatment plan.  Address health issues: Is being followed by Internal medicine for abdominal pain. Stool sample pending for to rule out potential infectious source. Continue Lisinopril 20 mg daily for Hypertension.   Medical Decision Making Problem Points:  Established problem, worsening (2), Review of last therapy session (1) and Review of psycho-social stressors (1) Data Points:  Review or order clinical lab tests (1) Review and summation of old records (2) Review of medication regiment & side effects (2)  I certify that inpatient services furnished can reasonably be expected to improve the patient's condition.   DAVIS, LAURA NP-C 03/28/2014, 1:05 PM I agree with assessment and plan Geralyn Flash A. Sabra Heck, M.D.

## 2014-03-28 NOTE — Plan of Care (Signed)
Problem: Ineffective individual coping Goal: STG: Patient will remain free from self harm Outcome: Progressing Patient has not engaged in any self harm. Endorses passive SI but contracts verbally for safety.   Problem: Alteration in mood & ability to function due to Goal: STG-Patient will comply with prescribed medication regimen (Patient will comply with prescribed medication regimen)  Outcome: Progressing Patient is med compliant.

## 2014-03-28 NOTE — Progress Notes (Signed)
Patient ID: [name], male   DOB: 1960/12/22, 53 y.o.   MRN: [id number]   [facility] PROGRESS NOTE  [name] DOB: 07/24/1960 DOA: 03/26/2014 PCP: Default, Provider, MD  Brief narrative: 53 year old male with history of cocaine abuse, alcohol abuse and bipolar disorder who was admitted to [facility] 03/26/2014 with depression and suicidal ideations. [facility] consulted because of complaints of nausea, vomiting and diarrhea. Patient reports ongoing nausea, vomiting and diarrhea for past 1-2 weeks prior to this admission. He reported mid abdominal discomfort, cramp like pain intermittent and not relieved with rest. No fevers or chills. No reports of bleeding. No GU complaints.   Assessment and Plan:  Abdominal pain, nausea, vomiting, diarrhea - likely withdrawal from substance abuse rather than an infectious process. - C. Diff by PCR negative, UA negative - recent CT abd unrmarkable - abd x ray did not show acute findings. - may use zofran as needed to help relieve N/V - if further pain persists would consult GI as from IM standpoint no further recommendations  Hep C - reactive on hep panel - pt will need outpatient follow up with GI for further recommendations  Transaminitis  - from Hep C, outpatient follow up  PSA - cocaine, THC HTN - continue Lisinopril  - may add Hydralazine 10 mg TID PO if SBP persistently > 150   Will sign off for now as no further recommendations. Treat symptoms of nausea with Zofran as needed and may use antidiarrheals if needed as C. Diff is negative. Call us with any other questions or concerns.    Debbora Presto, MD  [facility] Pager [phone] Cell [phone]   If 7PM-7AM, please contact night-coverage [email or web address] Password [email or web address] 03/28/2014, 1:18 PM   LOS: 2 days   Data Reviewed: Basic Metabolic Panel:  Recent Labs Lab 03/23/14 0943 03/23/14 0957 03/23/14 1532 03/24/14 0103 03/27/14 1925  NA 139 140  --  137 141  K 3.5*  3.3*  --  3.7 3.9  CL 103 105  --  102 101  CO2 21  --   --  20 25  GLUCOSE 94 96  --  90 118*  BUN 8 7  --  9 8  CREATININE 0.99 1.10  --  1.02 1.03  CALCIUM 9.6  --   --  9.3 10.0  MG  --   --  1.9  --   --    Liver Function Tests:  Recent Labs Lab 03/23/14 0943 03/24/14 0103  AST 185* 160*  ALT 373* 356*  ALKPHOS 58 55  BILITOT 0.6 0.4  PROT 7.9 7.4  ALBUMIN 4.1 3.8    Recent Results (from the past 240 hour(s))  MRSA PCR Screening     Status: None   Collection Time: 03/23/14  5:34 PM  Result Value Ref Range Status   MRSA by PCR NEGATIVE NEGATIVE Final   Collection Time: 03/24/14  4:06 PM  Result Value Ref Range Status   C difficile by pcr NEGATIVE NEGATIVE Final    Scheduled Meds . ARIPiprazole  5 mg Oral Daily  . gabapentin  300 mg Oral TID  . lisinopril  20 mg Oral Daily  . pantoprazole  40 mg Oral Daily

## 2014-03-28 NOTE — [facility] Group Notes (Signed)
[facility] Group Notes:  (Nursing/MHT/Case Management/Adjunct)  Date:  03/28/2014  Time:  9:00am  Type of Therapy:  Nurse Education  Participation Level:  Minimal  Participation Quality:  Resistant  Affect:  Irritable  Cognitive:  Oriented  Insight:  Lacking  Engagement in Group:  Limited  Modes of Intervention:  Discussion, Education and Support  Summary of Progress/Problems: Patient's participation in the self inventory morning group was limited in that his mood is quite irritable and he is fixated on seeing the doctor for pain medicine. Patient feels he is not getting the help he needs.  Lawrence Marseilles 03/28/2014, 1:07 PM

## 2014-03-28 NOTE — [facility] Group Notes (Signed)
[facility] LCSW Group Therapy Note  03/28/2014 11:15 AM   Type of Therapy and Topic:  Group Therapy: Avoiding Self-Sabotaging and Enabling Behaviors  Participation Level:  Active  Mood: Depressed  Description of Group:     Learn how to identify obstacles, self-sabotaging and enabling behaviors, what are they, why do we do them and what needs do these behaviors meet? Discuss unhealthy relationships and how to have positive healthy boundaries with those that sabotage and enable. Explore aspects of self-sabotage and enabling in yourself and how to limit these self-destructive behaviors in everyday life. A scaling question is used to help patient look at where they are now in their motivation to change, from 1 to 10 (lowest to highest motivation).  Therapeutic Goals: 1. Patient will identify one obstacle that relates to self-sabotage and enabling behaviors 2. Patient will identify one personal self-sabotaging or enabling behavior they did prior to admission 3. Patient able to establish a plan to change the above identified behavior they did prior to admission:  4. Patient will demonstrate ability to communicate their needs through discussion and/or role plays.   Summary of Patient Progress: Summary of Progress/Problems: The main focus of today's process group was for the patient to identify ways in which they have in the past sabotaged their own recovery. Motivational Interviewing was utilized to ask the group members what they get out of their substance use, isolation, noncompliance, self harm, negative self talk, etc and what reasons they may have for wanting to change. The Stages of Change were explained using a handout, and patients identified where they currently are with regard to stages of change. The patient expressed that he self sabotages by procrastination due to lack of motivation and feels it will be important to establish a more supportive network upon discharge.  He reports he is in the  Maintenance stage of change and due to past experience believes it will be of importance to keep his motivation high; again a support system was noted as playing a major role for him.  Therapeutic Modalities:   Cognitive Behavioral Therapy Person-Centered Therapy Motivational Interviewing   Carney Bern, LCSW

## 2014-03-29 MED ORDER — LITHIUM CARBONATE 300 MG PO CAPS
300.0000 mg | ORAL_CAPSULE | Freq: Two times a day (BID) | ORAL | Status: DC
Start: 2014-03-29 — End: 2014-04-03
  Administered 2014-03-29 – 2014-04-03 (×10): 300 mg via ORAL
  Filled 2014-03-29 (×12): qty 1

## 2014-03-29 MED ORDER — ARIPIPRAZOLE 5 MG PO TABS
7.0000 mg | ORAL_TABLET | Freq: Every day | ORAL | Status: DC
  Administered 2014-03-30 – 2014-04-01 (×3): 7 mg via ORAL
  Filled 2014-03-29 (×4): qty 1

## 2014-03-29 NOTE — Plan of Care (Signed)
Problem: Diagnosis: Increased Risk For Suicide Attempt Goal: STG-Patient Will Comply With Medication Regime Outcome: Completed/Met Date Met:  03/29/14 Patient compliant with medications and does not report adverse effects to this Probation officer.

## 2014-03-29 NOTE — Progress Notes (Signed)
Adult Psychoeducational Group Note  Date:  03/29/2014 Time:  11:22 PM  Group Topic/Focus:  Wrap-Up Group:   The focus of this group is to help patients review their daily goal of treatment and discuss progress on daily workbooks.  Participation Level:  Active  Participation Quality:  Appropriate  Affect:  Appropriate  Cognitive:  Appropriate  Insight: Appropriate  Engagement in Group:  Engaged  Modes of Intervention:  Activity and Socialization  Additional Comments:  Pt stated his goal for today was to be more sociable no matter what he was going through and to to be positive and nice to everyone despite how he was feeling. 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 Caswell Corwin 03/29/2014, 11:22 PM

## 2014-03-29 NOTE — Progress Notes (Signed)
Patient ID: [name], male   DOB: 11/04/1960, 53 y.o.   MRN: [id number]  D: Pt. Denies SI/HI and A/V Hallucinations. Patient reports pain in his back that is constant and chronic. Patient does report that the pain medication prescribed is helping. Spoke with patient about a need for getting a stool sample. A hat for patient to use is in patient's room and patient reports, "I know I need to give you a sample."   A: Support and encouragement provided to the patient. PRN Tramadol given to patient for back pain. Scheduled medications administered to patient per physician's orders.  R: Patient is receptive and cooperative with Press photographer. Patient is pleasant and smiling intermittently. Patient is seen in the milieu interacting with other peers. Q15 minute checks are maintained for safety.

## 2014-03-29 NOTE — [facility] Group Notes (Signed)
[facility] Group Notes:  (Nursing/MHT/Case Management/Adjunct)  Date:  03/29/2014  Time:  11:19 AM  Type of Therapy:  Psychoeducational Skills  Participation Level:  Did Not Attend  Participation Quality:  Did not attend  Affect:  Did not attend  Cognitive:  Did not attend  Insight:  None  Engagement in Group:  None  Modes of Intervention:  Did not attend  Summary of Progress/Problems: did not attend  Marzetta Board E 03/29/2014, 11:19 AM

## 2014-03-29 NOTE — Progress Notes (Signed)
Patient ID: [name], male   DOB: [birth year], 53 y.o.   MRN: [id number] [facility] MD Progress Note  03/29/2014 1:19 PM MARSH HECKLER  MRN:  [id number] Subjective:  Patient states "I am not doing well at all. I feel very depressed, racing thoughts in my head that does not want to quit. Christmas time has always been hard for me. I lost my brother/sister in-law around this time of the year. I believe that my medicines are not working. I have taken Lithium in the past, it made a big difference when I was on it. I need to be connected with my psychiatrist at the [facility] in [place] upon my discharge from here"  Objective:  Patient is seen and chart is reviewed. The patient reports increased symptoms of depression including suicidal thoughts. He is very frustrated over his current medication regimen. Reports that he receives all of his medications currently through the [facility]. Would like to re-connected with [facility] upon discharge.  Diagnosis:   DSM5:  Total Time spent with patient: 20 minutes Primary Psychiatric Diagnosis: Substance induced Bipolar and related disorder (cocaine ,ETOH) versus Bipolar disorder type I,depressed  Secondary Psychiatric Diagnosis: Stimulant use disorder ,severe ,cocaine type Alcohol use disorder ,severe Cannabis use disorder,severe  Non Psychiatric Diagnosis: See PMH  Past Medical History  Diagnosis Date  . Arthritis     per pt rheumatoid   . Hypertension   . Anxiety   . Bipolar affective disorder   . Depression   . Alcohol abuse   . Cocaine abuse     smokes crack-cocaine  . GERD (gastroesophageal reflux disease)   . Chronic back pain   . Tobacco abuse   . Hepatitis C   . Vitamin D deficiency   . Gout   . Hemorrhoids   . Diabetes mellitus without complication    ADL's:  Intact  Sleep: Fair  Appetite:  Good  Suicidal Ideation:  Plan:  "Put gun in my mouth."  Intent:  Present Means:  Currently safety  maintained in the hospital  Homicidal Ideation:  Plan:  "Strangle someone."  Intent:  Currently contracts  Means:  Safety maintained in the hospital  AEB (as evidenced by):  Psychiatric Specialty Exam: Physical Exam  Review of Systems  Constitutional: Negative for fever, chills, weight loss, malaise/fatigue and diaphoresis.  HENT: Negative for congestion, ear discharge, ear pain, hearing loss, nosebleeds, sore throat and tinnitus.   Eyes: Negative for blurred vision, double vision, photophobia, pain, discharge and redness.  Respiratory: Negative for cough, hemoptysis, sputum production, shortness of breath, wheezing and stridor.   Cardiovascular: Negative for chest pain, palpitations, orthopnea, claudication, leg swelling and PND.  Gastrointestinal: Negative for heartburn, nausea, vomiting, abdominal pain, diarrhea, constipation, blood in stool and melena.  Genitourinary: Negative for dysuria, urgency, frequency, hematuria and flank pain.  Musculoskeletal: Positive for myalgias and joint pain.  Skin: Negative for itching and rash.  Neurological: Negative for dizziness, tingling, tremors, sensory change, speech change, focal weakness, seizures, loss of consciousness, weakness and headaches.  Endo/Heme/Allergies: Negative for environmental allergies and polydipsia. Does not bruise/bleed easily.  Psychiatric/Behavioral: Positive for depression, suicidal ideas and substance abuse. The patient is nervous/anxious and has insomnia.     Blood pressure 150/104, pulse 87, temperature 97.6 F (36.4 C), temperature source Oral, resp. rate 18, height '5\' 8"'  (1.727 m), weight 118.389 kg (261 lb), SpO2 99 %.Body mass index is 39.69 kg/(m^2).  General Appearance: Disheveled  Eye Contact::  Good  Speech:  Clear and Coherent  and Pressured  Volume:  Increased  Mood:  Angry  Affect:  Labile  Thought Process:  Goal Directed and Intact  Orientation:  Full (Time, Place, and Person)  Thought Content:   Rumination  Suicidal Thoughts:  Yes.  with intent/plan  Homicidal Thoughts:  Yes.  without intent/plan  Memory:  Immediate;   Good Recent;   Good Remote;   Good  Judgement:  Poor  Insight:  Shallow  Psychomotor Activity:  Increased and Restlessness  Concentration:  Fair  Recall:  Good  Fund of Knowledge:Good  Language: Good  Akathisia:  No  Handed:  Right  AIMS (if indicated):     Assets:  Communication Skills Desire for Improvement Leisure Time Resilience  Sleep:  Number of Hours: 6.25   Musculoskeletal: Strength & Muscle Tone: within normal limits Gait & Station: normal Patient uses a cane to ambulate due to arthritis pain in his hips.  Patient leans: N/A  Current Medications: Current Facility-Administered Medications  Medication Dose Route Frequency Provider Last Rate Last Dose  . alum & mag hydroxide-simeth (MAALOX/MYLANTA) 200-200-20 MG/5ML suspension 30 mL  30 mL Oral Q4H PRN Waylan Boga, NP   30 mL at 03/29/14 1256  . ARIPiprazole (ABILIFY) tablet 5 mg  5 mg Oral Daily Elmarie Shiley, NP   5 mg at 03/29/14 6063  . gabapentin (NEURONTIN) capsule 300 mg  300 mg Oral TID Waylan Boga, NP   300 mg at 03/29/14 1254  . ibuprofen (ADVIL,MOTRIN) tablet 400 mg  400 mg Oral Q4H PRN Ursula Alert, MD   400 mg at 03/28/14 0650  . lisinopril (PRINIVIL,ZESTRIL) tablet 20 mg  20 mg Oral Daily Waylan Boga, NP   20 mg at 03/29/14 0160  . magnesium hydroxide (MILK OF MAGNESIA) suspension 30 mL  30 mL Oral Daily PRN Waylan Boga, NP      . methocarbamol (ROBAXIN) tablet 750 mg  750 mg Oral Q8H PRN Elmarie Shiley, NP   750 mg at 03/29/14 1093  . ondansetron (ZOFRAN-ODT) disintegrating tablet 4 mg  4 mg Oral Q6H PRN Robbie Lis, MD   4 mg at 03/28/14 [phone]  . pantoprazole (PROTONIX) EC tablet 40 mg  40 mg Oral Daily Waylan Boga, NP   40 mg at 03/29/14 7322  . traMADol (ULTRAM) tablet 50 mg  50 mg Oral Q6H PRN Elmarie Shiley, NP   50 mg at 03/29/14 1254  . zolpidem (AMBIEN) tablet 10 mg  10 mg  Oral QHS PRN Elmarie Shiley, NP   10 mg at 03/28/14 2216    Lab Results:  Results for orders placed or performed during the hospital encounter of 03/26/14 (from the past 48 hour(s))  Basic metabolic panel     Status: Abnormal   Collection Time: 03/27/14  7:25 PM  Result Value Ref Range   Sodium 141 137 - 147 mEq/L   Potassium 3.9 3.7 - 5.3 mEq/L   Chloride 101 96 - 112 mEq/L   CO2 25 19 - 32 mEq/L   Glucose, Bld 118 (H) 70 - 99 mg/dL   BUN 8 6 - 23 mg/dL   Creatinine, Ser 1.03 0.50 - 1.35 mg/dL   Calcium 10.0 8.4 - 10.5 mg/dL   GFR calc non Af Amer 81 (L) >90 mL/min   GFR calc Af Amer >90 >90 mL/min    Comment: (NOTE) The eGFR has been calculated using the CKD EPI equation. This calculation has not been validated in all clinical situations. eGFR's persistently <90 mL/min signify possible Chronic  Kidney Disease.    Anion gap 15 5 - 15    Comment: Performed at [facility]    Physical Findings: AIMS: Facial and Oral Movements Muscles of Facial Expression: None, normal Lips and Perioral Area: None, normal Jaw: None, normal Tongue: None, normal,Extremity Movements Upper (arms, wrists, hands, fingers): None, normal Lower (legs, knees, ankles, toes): None, normal, Trunk Movements Neck, shoulders, hips: None, normal, Overall Severity Severity of abnormal movements (highest score from questions above): None, normal Incapacitation due to abnormal movements: None, normal Patient's awareness of abnormal movements (rate only patient's report): No Awareness, Dental Status Current problems with teeth and/or dentures?: Yes Does patient usually wear dentures?: No  CIWA:  CIWA-Ar Total: 3 COWS:  COWS Total Score: 3  Treatment Plan Summary: Daily contact with patient to assess and evaluate symptoms and progress in treatment Medication management  Plan: 1. Continue crisis management and mood stabilization.  2. Medication management to stabilize current symptoms;  Continue Neurontin 300 mg TID for chronic pain/mood control, Increased Abilify to 7 mg daily for mood control, Ultram 50 mg every six hours prn pain,  Robaxin 750 mg every eight hours prn muscle pain, Ambien 10 mg hs prn insomnia, initiate Lithium Carbonate 300 mg bid for mood stabilization, obtain lithium levels on 04/01/14  3. Encouraged patient to attend groups and participate in group counseling sessions and activities. 4. Address health issues: Is being followed by Internal medicine for abdominal pain. Stool sample pending for to rule out potential infectious source. Continue Lisinopril 20 mg daily for Hypertension.   Medical Decision Making Problem Points:  Established problem, worsening (2), Review of last therapy session (1) and Review of psycho-social stressors (1) Data Points:  Review or order clinical lab tests (1) Review and summation of old records (2) Review of medication regiment & side effects (2)  I certify that inpatient services furnished can reasonably be expected to improve the patient's condition.   Encarnacion Slates [id number]-C 03/29/2014, 1:19 PM I agree with assessment and plan Geralyn Flash A. Sabra Heck, M.D.

## 2014-03-29 NOTE — [facility] Group Notes (Signed)
[facility] LCSW Group Therapy  03/29/2014 12:22 PM  Type of Therapy:  Group Therapy  Participation Level:  Active  Participation Quality:  Appropriate, Sharing and Supportive  Affect:  Appropriate  Cognitive:  Oriented  Insight:  Developing/Improving, Engaged and Supportive  Engagement in Therapy:  Developing/Improving, Engaged and Supportive  Modes of Intervention:  Discussion, Education, Exploration, Rapport Building and Support  Summary of Progress/Problems: Pt was able to engage and give personal examples of self sabotaging behaviors.  Pt was supportive of other people sharing and was able to identify some positive coping skills.   Seabron Spates 03/29/2014, 12:22 PM

## 2014-03-30 NOTE — [facility] Group Notes (Signed)
[facility] LCSW Aftercare Discharge Planning Group Note   03/30/2014 10:54 AM  Participation Quality:  Engaged  Mood/Affect:  Blunted  Depression Rating:  8-9  Anxiety Rating:  8-9  Thoughts of Suicide:  Yes Will you contract for safety?   Yes  Current AVH:  Yes  Plan for Discharge/Comments:  Feels optimistic that things will get better despite experiencing almost every symptoms possible now "because we are trying a new medication combination-it is Abilify and Lithium."  States his sleep and appetite are returning.  Got momentarily upset with another patient who wandered into the room and interrupted him, but overall handled it appropriately.  Transportation Means:   Supports:  Daryel Gerald B

## 2014-03-30 NOTE — Progress Notes (Signed)
Patient ID: [name], male   DOB: [birth year], 53 y.o.   MRN: [id number]   D: Pt informed the writer that his meds were changed. Stated that he's been on abilify however, this time "they've added lithium". Pt stated he was informed that it may take several weeks before he can tell a difference in his mood.   A:  Support and encouragement was offered. 15 min checks continued for safety.  R: Pt remains safe.

## 2014-03-30 NOTE — [facility] Group Notes (Signed)
[facility] LCSW Group Therapy  03/30/2014 1:32 PM  Type of Therapy:  Group Therapy  Participation Level:  Active  Participation Quality:  Attentive  Affect:  Appropriate  Cognitive:  Alert and oriented  Insight:  Engaged  Engagement in Therapy:  Engaged  Modes of Intervention:  Confrontation, Discussion, Education, Exploration, Problem-solving, Rapport Building, Socialization and Support  Summary of Progress/Problems: Today's Topic: Overcoming Obstacles. Pt identified obstacles faced currently and processed barriers involved in overcoming these obstacles. Pt identified steps necessary for overcoming these obstacles and explored motivation (internal and external) for facing these difficulties head on. Pt further identified one area of concern in their lives and chose a skill of focus pulled from their "toolbox." [name] was attentive and engaged during today's processing group. He shared that his biggest obstacle involves "self sabotage and letting my ego get in the way of recovery." [name] shared that he lost his daughter and fiance within a short time of each other, making him feel anger toward God and stated that he finds it difficult to cope with loss without turning to self medicating. He talked aobut having a brother that does not believe in mental illness, making it difficult for him to get the emotional support he needs.   Smart, [name] LCSWA 03/30/2014, 1:32 PM

## 2014-03-30 NOTE — Progress Notes (Signed)
[facility] MD Progress Note  03/30/2014 2:51 PM [name]  MRN:  [id number] Subjective:  [name] states that after doing well for several years he got into a situation with pain that triggered  the relapse on alcohol and the crack cocaine. Claims that contrary to other times he has a place to live. He also states that this is the anniversary of the death of his wife and daughter. States he is not being able to handle it. He has been off medications for all this time. So understands that it is going to be a while before the medications work for him. He still endorses SI. States he has a gun at home. 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   HENT: Negative.   Eyes: Negative.   Respiratory: Negative.   Cardiovascular: Negative.   Gastrointestinal: Negative.   Genitourinary: Negative.   Musculoskeletal: Positive for back pain and joint pain.  Skin: Negative.   Neurological: Negative.   Endo/Heme/Allergies: Negative.   Psychiatric/Behavioral: Positive for depression, suicidal ideas and substance abuse. The patient is nervous/anxious.     Blood pressure 160/100, pulse 80, temperature 97.5 F (36.4 C), temperature source Oral, resp. rate 18, height 5\' 8"  (1.727 m), weight 118.389 kg (261 lb), SpO2 99 %.Body mass index is 39.69 kg/(m^2).  General Appearance: Fairly Groomed  ::  Minimal  Speech:  Clear and Coherent, Slow and not spontaneous  Volume:  Decreased  Mood:  Angry, Depressed and Dysphoric  Affect:  Restricted  Thought Process:  Coherent and Goal  Directed  Orientation:  Full (Time, Place, and Person)  Thought Content:  symptoms events worries concerns  Suicidal Thoughts:  Yes with plan no intent while he is here  Homicidal Thoughts:  No  Memory:  Immediate;   Fair Recent;   Fair Remote;   Fair  Judgement:  Fair  Insight:  Present  Psychomotor Activity:  Restlessness  Concentration:  Fair  Recall:  [phone] of Knowledge:Fair  Language: Fair  Akathisia:  No  Handed:  Denies  AIMS (if indicated):     Assets:  Desire for Improvement Housing  Sleep:  Number of Hours: 5.25   Musculoskeletal: Strength & Muscle Tone: decreased Gait & Station: uses cane, affected by back pain Patient leans: N/A  Current Medications: Current Facility-Administered Medications  Medication Dose Route Frequency Provider Last Rate Last Dose  . alum & mag hydroxide-simeth (MAALOX/MYLANTA) 200-200-20 MG/5ML suspension 30 mL  30 mL Oral Q4H PRN [employer], NP   30 mL at 03/29/14 1256  . ARIPiprazole (ABILIFY) tablet 7 mg  7 mg Oral Daily 03/31/14, NP   7 mg at 03/30/14 0756  . gabapentin (NEURONTIN) capsule 300 mg  300 mg Oral TID 04/01/14, NP   300 mg at 03/30/14 1132  . ibuprofen (ADVIL,MOTRIN) tablet 400 mg  400 mg Oral Q4H PRN 04/01/14, MD   400 mg at 03/28/14 0650  . lisinopril (PRINIVIL,ZESTRIL) tablet 20 mg  20 mg Oral Daily 03/30/14, NP   20 mg at 03/30/14 04/01/14  .  lithium carbonate capsule 300 mg  300 mg Oral BID WC Sanjuana Kava, NP   300 mg at 03/30/14 0757  . magnesium hydroxide (MILK OF MAGNESIA) suspension 30 mL  30 mL Oral Daily PRN Nanine Means, NP      . methocarbamol (ROBAXIN) tablet 750 mg  750 mg Oral Q8H PRN Fransisca Kaufmann, NP   750 mg at 03/30/14 1134  . ondansetron (ZOFRAN-ODT) disintegrating tablet 4 mg  4 mg Oral Q6H PRN Alison Murray, MD   4 mg at 03/28/14 [phone]  . pantoprazole (PROTONIX) EC tablet 40 mg  40 mg Oral Daily Nanine Means, NP   40 mg at 03/30/14 0757  . traMADol (ULTRAM) tablet 50 mg  50 mg Oral  Q6H PRN Fransisca Kaufmann, NP   50 mg at 03/30/14 1133  . zolpidem (AMBIEN) tablet 10 mg  10 mg Oral QHS PRN Fransisca Kaufmann, NP   10 mg at 03/29/14 2322    Lab Results: No results found for this or any previous visit (from the past 48 hour(s)).  Physical Findings: AIMS: Facial and Oral Movements Muscles of Facial Expression: None, normal Lips and Perioral Area: None, normal Jaw: None, normal Tongue: None, normal,Extremity Movements Upper (arms, wrists, hands, fingers): None, normal Lower (legs, knees, ankles, toes): None, normal, Trunk Movements Neck, shoulders, hips: None, normal, Overall Severity Severity of abnormal movements (highest score from questions above): None, normal Incapacitation due to abnormal movements: None, normal Patient's awareness of abnormal movements (rate only patient's report): No Awareness, Dental Status Current problems with teeth and/or dentures?: Yes Does patient usually wear dentures?: No  CIWA:  CIWA-Ar Total: 1 COWS:  COWS Total Score: 1  Treatment Plan Summary: Daily contact with patient to assess and evaluate symptoms and progress in treatment Medication management  Plan: Supportive approach/coping skills/relapse prevention           Mood Instability (Bipolar Disorder): placed on Lithium, Abilify           Pain: Neurontin/Tramadol           Insomnia;Ambien            Will get CW to be sure gun is secured before D/C Medical Decision Making Problem Points:  Established problem, worsening (2) and Review of psycho-social stressors (1) Data Points:  Review of medication regiment & side effects (2)  I certify that inpatient services furnished can reasonably be expected to improve the patient's condition.   Andraya Frigon A 03/30/2014, 2:51 PM

## 2014-03-30 NOTE — Plan of Care (Signed)
Problem: Consults Goal: Suicide Risk Patient Education (See Patient Education module for education specifics)  Outcome: Completed/Met Date Met:  03/30/14 Nurse discussed suicide information with patient.

## 2014-03-30 NOTE — Progress Notes (Signed)
The focus of this group is to help patients review their daily goal of treatment and discuss progress on daily workbooks. Pt attended the evening group session and responded to all discussion prompts from the Writer. Pt shared that today was a good day on the unit, the highlight of which was his continued socialization with his peers. "When I'm here, I talk to people and I laugh. I don't isolate. I just know when I leave here I'm going to isolate again." Pt encouraged by the Writer and his peers to break that expectation upon leaving, either by going to meetings or pushing himself to be among friends or family. Pt's affect was appropriate.

## 2014-03-30 NOTE — Progress Notes (Signed)
D:  Patient's self inventory sheet, patient has fair sleep, sleep medication is helpful.  Fair appetite, low energy level, poor concentration.  Rated depression and hopeless 8, anxiety 9.  Denied withdrawals.  Has experienced chilling, cramping, nausea.  SI, contracts for safety.  Has felt lightheaded, pain, headaches.  Worst pain #8, pain medication is helpful.   A:  Medications administered per MD orders.  Emotional support and encouragement given patient. R:  Safety maintained with 15 minute checks.

## 2014-03-30 NOTE — Progress Notes (Signed)
Patient ID: [name], male   DOB: Mar 25, 1961, 53 y.o.   MRN: [id number]   D: When asked about his day pt stated, "ok". Pt appeared to be reluctant to discuss or have a conversation with the Clinical research associate as Clinical research associate overheard him mumble something to his peers before coming to speak with the Clinical research associate.  Writer decided to read report given by previous RN to the pt stating that he was suicidal. Pt stated, "Mainly I was concerned because of all the deaths in my family, and I live alone in my apt".  Pt stated that he "was afraid of what he might do".  Pt admitted to not taking his meds.   A:  Support and encouragement was offered. 15 min checks continued for safety.  R: Pt remains safe.

## 2014-03-30 NOTE — Discharge Summary (Signed)
Physician Discharge Summary Note  Patient:  [name] is an 53 y.o., male MRN:  [id number] DOB:  18-Dec-1960 Patient phone:  [phone] (home)  Patient address:   [address] [name] [address],  Total Time spent with patient: 30 minutes  Date of Admission:  03/26/2014 Date of Discharge: 04/06/2014   Reason for Admission:   Mood stabilization treatments  Discharge Diagnoses:   Active Problems:   Anxiety   Bipolar affective disorder   Polysubstance abuse   Polysubstance dependence   Bipolar affective disorder, depressed, severe   Bipolar disorder, current episode depressed, severe, without psychotic features   Alcohol use disorder, severe, dependence   Cocaine use disorder, severe, dependence   Generalized abdominal pain   Bipolar I disorder, most recent episode depressed  Psychiatric Specialty Exam: Physical Exam  Vitals reviewed. Psychiatric: He has a normal mood and affect. His behavior is normal. Judgment and thought content normal.    Review of Systems  Constitutional: Negative.   HENT: Negative.   Eyes: Negative.   Respiratory: Negative.   Cardiovascular: Negative.   Gastrointestinal: Negative.   Genitourinary: Negative.   Musculoskeletal: Positive for joint pain.  Skin: Negative.   Neurological: Negative.   Endo/Heme/Allergies: Negative.   Psychiatric/Behavioral: Positive for depression (HX OF, CHRONIC ). Negative for suicidal ideas, memory loss and substance abuse (HX OF, CHRONIC ). The patient is not nervous/anxious.     Blood pressure 139/103, pulse 123, temperature 97.9 F (36.6 C), temperature source Oral, resp. rate 20, height 5\' 8"  (1.727 m), weight 118.389 kg (261 lb), SpO2 100 %.Body mass index is 39.69 kg/(m^2).   Level of Care:  OP  Hospital Course:  [name] is a 53 year old AAM with past hx of cocaine abuse ,alcohol abuse and bipolar disorder (?substance induced), presented to the ED at [facility] with SI as well as  worsening depression. Patient also had chest pain ,nausea and vomitting and was admitted to the internal medicine unit for stabilizations.         [name] was admitted to the adult unit. He was evaluated and his symptoms were identified. Medication management was discussed and initiated. Patient was started on Abilify for mood stabilization, which was eventually titrated to 10 mg daily. He was also started on Lithium due to observations of very unstable mood on the unit. The patient became very upset at first complaining that his pain was not being adequately controlled. He became much more pleasant after robaxin and ultram were ordered on a prn basis. He was oriented to the unit and encouraged to participate in unit programming. Medical problems were identified and treated appropriately. His Lisinopril dosage was increased to 30 mg daily due to consistently elevated blood pressure. However, the patient had also not been compliant with his blood pressure medication for more than a month prior to his admission. Home medication was restarted as needed.        The patient was evaluated each day by a clinical provider to ascertain the patient's response to treatment.  Improvement was noted by the patient's report of decreasing symptoms, improved sleep and appetite, affect, medication tolerance, behavior, and participation in unit programming.  He was asked each day to complete a self inventory noting mood, mental status, pain, new symptoms, anxiety and concerns.         He responded well to medication and being in a therapeutic and supportive environment. Nursing staff noted the patient's mood became much less  irritable. He reported depression due to recent death of a cousin. Positive and appropriate behavior was noted and the patient was motivated for recovery.  The patient worked closely with the treatment team and case manager to develop a discharge plan with appropriate goals. Coping skills, problem solving  as well as relaxation therapies were also part of the unit programming.         By the day of discharge he was in much improved condition than upon admission.  Symptoms were reported as significantly decreased or resolved completely.  The patient denied SI/HI and voiced no AVH. He was motivated to continue taking medication with a goal of continued improvement in mental health.  [name] was discharged home with a plan to follow up as noted below. Patient will need to have a Lithium level drawn within five days to monitor to determine therapeutic level.   Consults:  psychiatry and Internal Medicine for abdominal pain  Significant Diagnostic Studies:  labs: Routine admission labwork completed.   Discharge Vitals:   Blood pressure 139/103, pulse 123, temperature 97.9 F (36.6 C), temperature source Oral, resp. rate 20, height 5\' 8"  (1.727 m), weight 118.389 kg (261 lb), SpO2 100 %. Body mass index is 39.69 kg/(m^2). Lab Results:   No results found for this or any previous visit (from the past 72 hour(s)).  Physical Findings: AIMS: Facial and Oral Movements Muscles of Facial Expression: None, normal Lips and Perioral Area: None, normal Jaw: None, normal Tongue: None, normal,Extremity Movements Upper (arms, wrists, hands, fingers): None, normal Lower (legs, knees, ankles, toes): None, normal, Trunk Movements Neck, shoulders, hips: None, normal, Overall Severity Severity of abnormal movements (highest score from questions above): None, normal Incapacitation due to abnormal movements: None, normal Patient's awareness of abnormal movements (rate only patient's report): No Awareness, Dental Status Current problems with teeth and/or dentures?: Yes Does patient usually wear dentures?: No  CIWA:  CIWA-Ar Total: 1 COWS:  COWS Total Score: 1  Psychiatric Specialty Exam: See Psychiatric Specialty Exam and Suicide Risk Assessment completed by Attending Physician prior to  discharge.  Discharge destination:  Home  Is patient on multiple antipsychotic therapies at discharge:  No   Has Patient had three or more failed trials of antipsychotic monotherapy by history:  No  Recommended Plan for Multiple Antipsychotic Therapies: NA      Discharge Instructions    Discharge instructions    Complete by:  As directed   Please follow up with your Primary Care Provider for further management of your chronic medical problems such as Hypertension.     Discharge instructions    Complete by:  As directed   Please ensure that you have a Lithium level drawn within the next five days to make sure the medication is at the therapeutic dosage. Your follow up Provider will be monitoring your Lithium levels.            Medication List    STOP taking these medications        acetaminophen 500 MG tablet  Commonly known as:  TYLENOL      TAKE these medications      Indication   ARIPiprazole 10 MG tablet  Commonly known as:  ABILIFY  Take 1 tablet (10 mg total) by mouth daily.   Indication:  Cocaine Dependence, Mood control     gabapentin 300 MG capsule  Commonly known as:  NEURONTIN  Take 1 capsule (300 mg total) by mouth 3 (three) times daily.  Indication:  Aggressive Behavior, Agitation, Cocaine Dependence     hydrocortisone 2.5 % rectal cream  Commonly known as:  ANUSOL-HC  Place 1 application rectally 2 (two) times daily.   Indication:  Inflamed Hemorrhoids     hydrocortisone 25 MG suppository  Commonly known as:  HEMORRHOIDAL-HC  Place 1 suppository (25 mg total) rectally 2 (two) times daily.   Indication:  Inflamed Hemorrhoids     ibuprofen 800 MG tablet  Commonly known as:  ADVIL,MOTRIN  Take 800 mg by mouth every 8 (eight) hours as needed (for pain).      lisinopril 30 MG tablet  Commonly known as:  PRINIVIL  Take 1 tablet (30 mg total) by mouth daily.   Indication:  High Blood Pressure     lithium carbonate 150 MG capsule  Take 3 capsules  (450 mg total) by mouth 2 (two) times daily with a meal.   Indication:  Manic-Depression, Mood stabilization     methocarbamol 750 MG tablet  Commonly known as:  ROBAXIN  Take 1 tablet (750 mg total) by mouth every 8 (eight) hours as needed for muscle spasms.   Indication:  Musculoskeletal Pain     pantoprazole 40 MG tablet  Commonly known as:  PROTONIX  Take 1 tablet (40 mg total) by mouth daily.   Indication:  Gastroesophageal Reflux Disease     traMADol 50 MG tablet  Commonly known as:  ULTRAM  Take 2 tablets (100 mg total) by mouth every 6 (six) hours as needed for severe pain.   Indication:  Moderate to Moderately Severe Pain     zolpidem 10 MG tablet  Commonly known as:  AMBIEN  Take 1 tablet (10 mg total) by mouth at bedtime as needed for sleep.   Indication:  Trouble Sleeping       Follow-up Information    Follow up with [facility] On 04/13/2014.   Why:  Appt for hospital follow-up on this date at 2pm.    Contact information:   [address]  [place], [place]  Phone: [phone] or [phone]      Follow-up recommendations:   Activity: no restrictions Diet: Heart  Comments:  1.  Take all your medications as prescribed.              2.  Report any adverse side effects to outpatient provider.                       3.  Patient instructed to not use alcohol or illegal drugs while on prescription medicines.            4.  In the event of worsening symptoms, instructed patient to call 911, the crisis hotline or go to nearest emergency room for evaluation of symptoms.  Total Discharge Time:  Greater than 30 minutes.  SignedFransisca Kaufmann NP-C 04/06/2014, 11:55 AM  I personally assessed the patient and formulated the plan Madie Reno A. Dub Mikes, M.D.

## 2014-03-31 LAB — GI PATHOGEN PANEL BY PCR, STOOL
C difficile toxin A/B: NEGATIVE
Campylobacter by PCR: NEGATIVE
Cryptosporidium by PCR: NEGATIVE
E coli (ETEC) LT/ST: NEGATIVE
E coli (STEC): NEGATIVE
E coli 0157 by PCR: NEGATIVE
G lamblia by PCR: NEGATIVE
Norovirus GI/GII: NEGATIVE
Rotavirus A by PCR: NEGATIVE
Salmonella by PCR: NEGATIVE
Shigella by PCR: NEGATIVE

## 2014-03-31 LAB — CLOSTRIDIUM DIFFICILE BY PCR: Toxigenic C. Difficile by PCR: NEGATIVE

## 2014-03-31 MED ORDER — HYDROXYZINE HCL 25 MG PO TABS
25.0000 mg | ORAL_TABLET | Freq: Four times a day (QID) | ORAL | Status: DC | PRN
  Administered 2014-03-31 – 2014-04-02 (×4): 25 mg via ORAL
  Filled 2014-03-31 (×3): qty 1

## 2014-03-31 NOTE — Progress Notes (Signed)
D: Pt alert / cooperative with care at present. Denied  A / V hallucinations when assessed. Contracted for safety while hospitalized. Affect flat on initial contact but brightens up as shift progressed. Pt requested and received PRN Tramadol for c/o leg pain and Robaxin for spasm. A. All medications given as per order. Support and availability offered. Safety maintained on Q 15 minutes checks as per order. R: Pt visible in milieu at intervals during shift. Attended groups as scheduled. Verbalized his needs safely. No behavioral outburst to note at present.

## 2014-03-31 NOTE — [facility] Group Notes (Signed)
[facility] Group Notes:  (Counselor/Nursing/MHT/Case Management/Adjunct)  03/31/2014 1:15PM  Type of Therapy:  Group Therapy  Participation Level:  Active  Participation Quality:  Appropriate  Affect:  Flat  Cognitive:  Oriented  Insight:  Improving  Engagement in Group:  Limited  Engagement in Therapy:  Limited  Modes of Intervention:  Discussion, Exploration and Socialization  Summary of Progress/Problems: The topic for group was balance in life.  Pt participated in the discussion about when their life was in balance and out of balance and how this feels.  Pt discussed ways to get back in balance and short term goals they can work on to get where they want to be. [name] stayed briefly. Got up and left.  Did not return.  Daryel Gerald B 03/31/2014 11:26 AM

## 2014-03-31 NOTE — Plan of Care (Signed)
Problem: Alteration in mood & ability to function due to Goal: STG-Patient will attend groups Outcome: Progressing Pt attended group this AM, was verbally interactive and engaged in group session.

## 2014-03-31 NOTE — Progress Notes (Signed)
The focus of this group is to help patients review their daily goal of treatment and discuss progress on daily workbooks. Pt attended the evening group session and responded to all discussion prompts from the Writer. Pt shared that today was a good day, the highlight of which was catching up on his sleep after a difficult night. Kharter told the group that how he looked now - sociable and friendly - was how he looked when he was doing well. Pt's affect was appropriate.

## 2014-03-31 NOTE — Progress Notes (Signed)
Patient ID: [name], male   DOB: 10-11-1960, 53 y.o.   MRN: [id number] [facility] MD Progress Note  03/31/2014 11:33 AM [name]  MRN:  [id number] Subjective:  [name] states that after doing well for several years he got into a situation with pain that triggered  the relapse on alcohol and the crack cocaine.  Says [name] was hard because of daughters and wife death 2 years ago. Claims that contrary to other times he has a place to live. He understands things piled up and his poor judjement of drinking made him worse.  States he is not being able to handle it. He has been off medications for all this time. So understands that it is going to be a while before the medications work for him. He still endorses SI.  States not want to die and continue help. 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   HENT: Negative.   Eyes: Negative.   Respiratory: Negative.   Cardiovascular: Negative.   Gastrointestinal: Negative.   Genitourinary: Negative.   Musculoskeletal: Positive for back pain and joint pain.  Skin: Negative.   Neurological: Negative.   Endo/Heme/Allergies: Negative.   Psychiatric/Behavioral: Positive for depression, suicidal ideas and substance abuse. The patient is nervous/anxious.     Blood pressure 142/90, pulse 88, temperature 97.5 F (36.4 C), temperature source Oral, resp. rate 18, height 5\' 8"  (1.727 m), weight 118.389 kg (261 lb), SpO2 99 %.Body mass index is 39.69 kg/(m^2).  General Appearance: Fairly Groomed  ::  Minimal  Speech:  Clear and Coherent, Slow and not spontaneous  Volume:  Decreased  Mood:  Angry, Depressed and Dysphoric. Not  hopeles   Affect:  Restricted  Thought Process:  Coherent and Goal Directed  Orientation:  Full (Time, Place, and Person)  Thought Content:  symptoms events worries concerns  Suicidal Thoughts:  Yes with plan no intent while he is here  Homicidal Thoughts:  No  Memory:  Immediate;   Fair Recent;   Fair Remote;   Fair  Judgement:  Fair  Insight:  Present  Psychomotor Activity:  Restlessness  Concentration:  Fair  Recall:  [phone] of Knowledge:Fair  Language: Fair  Akathisia:  No  Handed:  Denies  AIMS (if indicated):     Assets:  Desire for Improvement Housing  Sleep:  Number of Hours: 4.75   Musculoskeletal: Strength & Muscle Tone: decreased Gait & Station: uses cane, affected by back pain Patient leans: N/A  Current Medications: Current Facility-Administered Medications  Medication Dose Route Frequency Provider Last Rate Last Dose  . alum & mag hydroxide-simeth (MAALOX/MYLANTA) 200-200-20 MG/5ML suspension 30 mL  30 mL Oral Q4H PRN [employer], NP   30 mL at 03/29/14 1256  . ARIPiprazole (ABILIFY) tablet 7 mg  7 mg Oral Daily 03/31/14, NP   7 mg at 03/31/14 04/02/14  . gabapentin (NEURONTIN) capsule 300 mg  300 mg Oral TID 6644, NP   300 mg at 03/31/14 04/02/14  . hydrOXYzine (ATARAX/VISTARIL) tablet 25 mg  25 mg Oral Q6H PRN 0347, PA-C   25 mg at 03/31/14 0406  . ibuprofen (ADVIL,MOTRIN) tablet 400 mg  400 mg Oral Q4H  PRN Jomarie Longs, MD   400 mg at 03/28/14 0650  . lisinopril (PRINIVIL,ZESTRIL) tablet 20 mg  20 mg Oral Daily Nanine Means, NP   20 mg at 03/31/14 5686  . lithium carbonate capsule 300 mg  300 mg Oral BID WC Sanjuana Kava, NP   300 mg at 03/31/14 1683  . magnesium hydroxide (MILK OF MAGNESIA) suspension 30 mL  30 mL Oral Daily PRN Nanine Means, NP      . methocarbamol (ROBAXIN) tablet 750 mg  750 mg Oral Q8H PRN Fransisca Kaufmann, NP   750 mg at 03/31/14 0941  . ondansetron (ZOFRAN-ODT) disintegrating tablet 4 mg  4 mg Oral Q6H PRN Alison Murray, MD   4 mg at 03/28/14 [phone]  . pantoprazole (PROTONIX) EC tablet 40 mg  40 mg Oral Daily Nanine Means, NP   40 mg at 03/31/14 2111  . traMADol (ULTRAM) tablet 50 mg  50 mg Oral Q6H PRN Fransisca Kaufmann, NP   50 mg at 03/31/14 0941  . zolpidem (AMBIEN) tablet 10 mg  10 mg Oral QHS PRN Fransisca Kaufmann, NP   10 mg at 03/30/14 2105    Lab Results: No results found for this or any previous visit (from the past 48 hour(s)).  Physical Findings: AIMS: Facial and Oral Movements Muscles of Facial Expression: None, normal Lips and Perioral Area: None, normal Jaw: None, normal Tongue: None, normal,Extremity Movements Upper (arms, wrists, hands, fingers): None, normal Lower (legs, knees, ankles, toes): None, normal, Trunk Movements Neck, shoulders, hips: None, normal, Overall Severity Severity of abnormal movements (highest score from questions above): None, normal Incapacitation due to abnormal movements: None, normal Patient's awareness of abnormal movements (rate only patient's report): No Awareness, Dental Status Current problems with teeth and/or dentures?: Yes Does patient usually wear dentures?: No  CIWA:  CIWA-Ar Total: 1 COWS:  COWS Total Score: 1  Treatment Plan Summary: Daily contact with patient to assess and evaluate symptoms and progress in treatment Medication management  Plan: Supportive approach/coping skills/relapse prevention. Showing understanding of compliance            Mood Instability (Bipolar Disorder): placed on Lithium, Abilify           Pain: Neurontin/Tramadol           Insomnia;Ambien            Will get CW to be sure gun is secured before D/C  Medical Decision Making Problem Points:  Established problem, worsening (2) and Review of psycho-social stressors (1) Data Points:  Review of medication regiment & side effects (2)  I certify that inpatient services furnished can reasonably be expected to improve the patient's condition.   Tamiko Leopard 03/31/2014,  11:33 AM

## 2014-03-31 NOTE — Plan of Care (Signed)
Problem: Alteration in mood & ability to function due to Goal: STG-Patient will comply with prescribed medication regimen (Patient will comply with prescribed medication regimen)  Outcome: Progressing Pt compliant with ordered medications when offered this shift. Voiced no concerns, denied side effects when assessed assessed.

## 2014-03-31 NOTE — Progress Notes (Signed)
Patient ID: [name], male   DOB: 10-15-1960, 53 y.o.   MRN: [id number] D: Patient alert and cooperative. Pt reports his day was up and down. Pt denies SI/HI/AVH. Pt thought process is organized and behavior is appropriate. Pt mood/affect is depressed and flat.  A: Medications administered as prescribed. Emotional support given and will continue to monitor pt's progress for stabilization.  R: Patient is safe and complaint with medications.  Pt attended evening wrap up group and engaged in discussion.

## 2014-03-31 NOTE — [facility] Group Notes (Signed)
0900 nursing orientation group   The focus of this group is to educate the patient on the purpose and policies of crisis stabilization and provide a format to answer questions about their admission.  The group details unit policies and expectations of patients while admitted.   Pt was an active participant in group and was appropriate in sharing.  

## 2014-04-01 DIAGNOSIS — F314 Bipolar disorder, current episode depressed, severe, without psychotic features: Secondary | ICD-10-CM | POA: Insufficient documentation

## 2014-04-01 LAB — LITHIUM LEVEL: Lithium Lvl: 0.15 mmol/L — ABNORMAL LOW (ref 0.80–1.40)

## 2014-04-01 MED ORDER — LISINOPRIL 10 MG PO TABS
10.0000 mg | ORAL_TABLET | Freq: Once | ORAL | Status: AC
  Administered 2014-04-01: 10 mg via ORAL
  Filled 2014-04-01: qty 1
  Filled 2014-04-01: qty 2

## 2014-04-01 MED ORDER — LISINOPRIL 10 MG PO TABS
30.0000 mg | ORAL_TABLET | Freq: Every day | ORAL | Status: DC
  Administered 2014-04-02 – 2014-04-06 (×5): 30 mg via ORAL
  Filled 2014-04-01 (×7): qty 3

## 2014-04-01 MED ORDER — ARIPIPRAZOLE 10 MG PO TABS
10.0000 mg | ORAL_TABLET | Freq: Every day | ORAL | Status: DC
  Administered 2014-04-02 – 2014-04-06 (×5): 10 mg via ORAL
  Filled 2014-04-01: qty 3
  Filled 2014-04-01 (×4): qty 1
  Filled 2014-04-01: qty 3
  Filled 2014-04-01 (×2): qty 1

## 2014-04-01 NOTE — Plan of Care (Signed)
Problem: Ineffective individual coping Goal: STG: Patient will remain free from self harm Outcome: Progressing Patient remains free from self harm. Pt denies SI today. 15 minute checks completed per protocol for patient safety.   Problem: Alteration in mood & ability to function due to Goal: STG-Patient will attend groups Outcome: Progressing Patient has attended all unit groups today.  Goal: STG-Patient will comply with prescribed medication regimen (Patient will comply with prescribed medication regimen)  Outcome: Progressing Patient has complied with medication regimen today.  Comments:  Alena Bills, RN

## 2014-04-01 NOTE — Progress Notes (Signed)
D: Patient is alert and oriented. Pt's mood and affect is anxious, irritable, and flat. Pt denies SI and AVH. Pt reports feelings of HI towards another pt and reports "she won't shutup, she kept me up all night." Pt had an aggressive verbal interaction with another pt. Pt experiencing HTN today (See Docflowsheet-vitals). Pt denies HI at 1640 and reports "I have calmed down." Pt did not turn in self inventory sheet today. A: Pt easily calmed and redirectable during interaction with another pt. Pt encouraged to remove self from situation that increases his anxiety and agitation. Active listening. Dr. Gilmore Laroche notified of pt's increased BP, verbal ordered to reassess after lunch. After lunch, Vernona Rieger NP notified of pts HTN and new orders acknowledged. PRN medications administered for anxiety per providers orders (See MAR). Scheduled medications administered per providers orders (See MAR). 15 minute checks completed per protocol for patient safety.  R: Pt cooperative and receptive to nursing interventions.

## 2014-04-01 NOTE — Progress Notes (Signed)
Adult Psychoeducational Group Note  Date:  04/01/2014 Time:  9:29 PM  Group Topic/Focus:  Wrap-Up Group:   The focus of this group is to help patients review their daily goal of treatment and discuss progress on daily workbooks.  Participation Level:  Minimal  Participation Quality:  Drowsy  Affect:  Flat  Cognitive:  Appropriate  Insight: Limited  Engagement in Group:  Limited  Modes of Intervention:  Socialization and Support  Additional Comments:  Patient attended and participated in group tonight. He reports having a good day. He went for his meals and attended his groups.  The patient advised that he consider himself a good friend because he is a good listener and gives eye contact.  Lita Mains [facility] 04/01/2014, 9:29 PM

## 2014-04-01 NOTE — Progress Notes (Signed)
Patient ID: [name], male   DOB: 01/24/1961, 53 y.o.   MRN: [id number] [facility] MD Progress Note  04/01/2014 10:48 AM KIRKE BREACH  MRN:  [id number] Subjective:  [name] states that after doing well for several years but then  he got into a situation with pain that triggered  the relapse on alcohol and the crack cocaine.  Says Tennis Must was hard because of daughters and wife death 2 years ago. Claims that contrary to other times he has a place to live. He understands things piled up and his poor judjement of drinking made him worse.  States he is not being able to handle it. He has been off medications for all this time. So understands that it is going to be a while before the medications work for him. He still endorses SI.  States not want to die and continue help. Continues to struggle with depression today and had poor sleep. Another paitient was walking in the hallway that kept him up. Attending groups and remain mobile. Some concern of anxiety. No panic attacks.  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   Eyes: Negative.   Cardiovascular: Negative.   Gastrointestinal: Negative.   Genitourinary: Negative.   Musculoskeletal: Positive for back pain and joint pain.  Skin: Negative for rash.  Neurological: Negative.  Negative for headaches.  Endo/Heme/Allergies: Negative.   Psychiatric/Behavioral: Positive for depression, suicidal ideas and substance abuse. The patient is nervous/anxious.     Blood pressure 133/103, pulse 115, temperature 98.3 F (36.8 C), temperature source Oral, resp. rate 20, height 5\' 8"  (1.727 m), weight 118.389 kg (261 lb), SpO2 99 %.Body mass index  is 39.69 kg/(m^2).  General Appearance: Fairly Groomed  ::  Minimal  Speech:  Clear and Coherent, Slow and not spontaneous  Volume:  Decreased  Mood:  Dysphoric but  Not hopeles   Affect:  Restricted  Thought Process:  Coherent and Goal Directed  Orientation:  Full (Time, Place, and Person)  Thought Content:  symptoms events worries concerns  Suicidal Thoughts:  Yes with plan no intent while he is here  Homicidal Thoughts:  No  Memory:  Immediate;   Fair Recent;   Fair Remote;   Fair  Judgement:  Fair  Insight:  Present  Psychomotor Activity:  Restlessness  Concentration:  Fair  Recall:  [id number] of Knowledge:Fair  Language: Fair  Akathisia:  No  Handed:  Denies  AIMS (if indicated):     Assets:  Desire for Improvement Housing  Sleep:  Number of Hours: 4.5   Musculoskeletal: Strength & Muscle Tone: decreased Gait & Station: uses cane, affected by back pain Patient leans: N/A  Current Medications: Current Facility-Administered Medications  Medication Dose Route Frequency Provider Last Rate Last Dose  . alum & mag hydroxide-simeth (MAALOX/MYLANTA) 200-200-20 MG/5ML suspension 30 mL  30 mL Oral Q4H PRN [employer], NP   30 mL at 03/29/14 1256  . [START ON 04/02/2014] ARIPiprazole (ABILIFY) tablet 10 mg  10 mg Oral Daily 04/04/2014, MD      . gabapentin (NEURONTIN) capsule 300 mg  300 mg Oral TID Thresa Ross, NP   300 mg at 04/01/14 0836  . hydrOXYzine (ATARAX/VISTARIL) tablet  25 mg  25 mg Oral Q6H PRN Kerry Hough, PA-C   25 mg at 04/01/14 1022  . ibuprofen (ADVIL,MOTRIN) tablet 400 mg  400 mg Oral Q4H PRN Jomarie Longs, MD   400 mg at 03/28/14 0650  . lisinopril (PRINIVIL,ZESTRIL) tablet 20 mg  20 mg Oral Daily Nanine Means, NP   20 mg at 04/01/14 0836  . lithium carbonate capsule 300 mg  300 mg Oral BID WC Sanjuana Kava, NP   300 mg at 04/01/14 0836  . magnesium hydroxide (MILK OF MAGNESIA) suspension 30 mL  30 mL Oral Daily PRN Nanine Means, NP       . methocarbamol (ROBAXIN) tablet 750 mg  750 mg Oral Q8H PRN Fransisca Kaufmann, NP   750 mg at 04/01/14 0640  . ondansetron (ZOFRAN-ODT) disintegrating tablet 4 mg  4 mg Oral Q6H PRN Alison Murray, MD   4 mg at 04/01/14 [phone]  . pantoprazole (PROTONIX) EC tablet 40 mg  40 mg Oral Daily Nanine Means, NP   40 mg at 04/01/14 0837  . traMADol (ULTRAM) tablet 50 mg  50 mg Oral Q6H PRN Fransisca Kaufmann, NP   50 mg at 04/01/14 3149  . zolpidem (AMBIEN) tablet 10 mg  10 mg Oral QHS PRN Fransisca Kaufmann, NP   10 mg at 03/31/14 2125    Lab Results:  Results for orders placed or performed during the hospital encounter of 03/26/14 (from the past 48 hour(s))  Lithium level     Status: Abnormal   Collection Time: 04/01/14  6:25 AM  Result Value Ref Range   Lithium Lvl 0.15 (L) 0.80 - 1.40 mmol/L    Comment: Performed at [facility]    Physical Findings: AIMS: Facial and Oral Movements Muscles of Facial Expression: None, normal Lips and Perioral Area: None, normal Jaw: None, normal Tongue: None, normal,Extremity Movements Upper (arms, wrists, hands, fingers): None, normal Lower (legs, knees, ankles, toes): None, normal, Trunk Movements Neck, shoulders, hips: None, normal, Overall Severity Severity of abnormal movements (highest score from questions above): None, normal Incapacitation due to abnormal movements: None, normal Patient's awareness of abnormal movements (rate only patient's report): No Awareness, Dental Status Current problems with teeth and/or dentures?: Yes Does patient usually wear dentures?: No  CIWA:  CIWA-Ar Total: 1 COWS:  COWS Total Score: 1  Treatment Plan Summary: Daily contact with patient to assess and evaluate symptoms and progress in treatment Medication management  Plan: Supportive approach/coping skills/relapse prevention. Showing understanding of compliance            Mood Instability (Bipolar Disorder): placed on Lithium, Abilify           Pain:  Neurontin/Tramadol           Insomnia;Ambien            Will get CW to be sure gun is secured before D/C  Increase abilify to 10mg  for depression.   Medical Decision Making Problem Points:  Established problem, worsening (2) and Review of psycho-social stressors (1) Data Points:  Review of medication regiment & side effects (2)  I certify that inpatient services furnished can reasonably be expected to improve the patient's condition.   Saniah Schroeter 04/01/2014, 10:48 AM

## 2014-04-01 NOTE — Plan of Care (Signed)
Problem: Diagnosis: Increased Risk For Suicide Attempt Goal: STG-Patient Will Attend All Groups On The Unit Outcome: Progressing Pt attended evening wrap up group this evening

## 2014-04-01 NOTE — [facility] Group Notes (Signed)
[facility] LCSW Group Therapy  04/01/2014 3:23 PM  Type of Therapy:  Group Therapy  Participation Level:  Active  Participation Quality:  Attentive  Affect:  Appropriate  Cognitive:  Alert and Oriented  Insight:  Engaged  Engagement in Therapy:  Engaged  Modes of Intervention:  Confrontation, Discussion, Education, Exploration, Problem-solving, Rapport Building, Socialization and Support  Summary of Progress/Problems:  CSW provided information about the Mental Health Association support groups and peer support resources available. AA/NA meeting list and IRC (interactive resource center) handout also provided and patients were invited to share their experiences with these community resources. Group members were then invited to participate in a group activity where they picked questions randomly out of a cup and shared their response for the group for processing. [name] was attentive and engaged during today's group. He shared his positive experinces in going to the [facility] with other group members. [name] stated that he would be a good friend because he is loyal, "even though I can be an asshole." [name] demonstrates progress in the group setting and improving insight. He uses humor to connect with others and provided positive emotional support to other group members.   Smart, [name] LCSWA  04/01/2014, 3:23 PM

## 2014-04-01 NOTE — Tx Team (Signed)
  Interdisciplinary Treatment Plan Update   Date Reviewed:  04/01/2014  Time Reviewed:  8:06 AM  Progress in Treatment:   Attending groups: Yes Participating in groups: Yes Taking medication as prescribed: Yes  Tolerating medication: Yes Family/Significant other contact made: Yes  Patient understands diagnosis: Yes  Discussing patient identified problems/goals with staff: Yes  See initial care plan Medical problems stabilized or resolved: Yes Denies suicidal/homicidal ideation: Yes  In tx team Patient has not harmed self or others: Yes  For review of initial/current patient goals, please see plan of care.  Estimated Length of Stay:  4-5 days Reason for Continuation of Hospitalization: Depression Hallucinations Medication stabilization  New Problems/Goals identified:  N/A  Discharge Plan or Barriers:   return home, follow up [facility]  Additional Comments:  Says Tennis Must was hard because of daughters and wife death 2 years ago. Claims that contrary to other times he has a place to live. He understands things piled up and his poor judjement of drinking made him worse. States he is not being able to handle it. He has been off medications for all this time. So understands that it is going to be a while before the medications work for him. He still endorses SI. States not want to die and continue help.  Med changes today include: Double Abilify to 10, Increase Lisinopril to 30.  Attendees:  Signature:  04/01/2014 8:06 AM   Signature: Richelle Ito, LCSW 04/01/2014 8:06 AM  Signature: Fransisca Kaufmann, NP 04/01/2014 8:06 AM  Signature:  04/01/2014 8:06 AM  Signature: Liborio Nixon, RN 04/01/2014 8:06 AM  Signature:  04/01/2014 8:06 AM  Signature:   04/01/2014 8:06 AM  Signature:    Signature:    Signature:    Signature:    Signature:    Signature:      Scribe for Treatment Team:   Richelle Ito, LCSW  04/01/2014 8:06 AM

## 2014-04-01 NOTE — Clinical Social Work Note (Signed)
CSW met w patient at bedside, discussed discharge planning options w patient.  Patient states that he is being pursued by devil worshippers and organized crime.  When "they" find him, he fears being tortured to death, a process he describes in detail.  Acknowledges that this is why he keeps on the move, states he came to [facility] from [place] because he is searching for a home that he cannot identify or locate.  Says he is "very sensitive" and will know when he finds this place.  Prior to admission he was sleeping in truckers lounge at truck stop, his eventual goal is to get to [place].  Is awaiting his SSI payment on his card (04/10/14) at which time he will make his way to an unknown destination in [place].  Was willing to consider a short term plan of bed at [facility] and support from [place]; however, he is concerned about being around "too many people" in a shelter and is reluctant.  Says he had Medicaid in [place] but has not switched it to [place] because "I wont be here very long, I am moving on."  Signed consents for [facility] and [employer].  MD encouraged patient to get out of bed and attend group.  Patient observed making his way out of bed towards door.  Edwyna Shell, LCSW Clinical Social Worker

## 2014-04-01 NOTE — [facility] Group Notes (Signed)
[facility] LCSW Aftercare Discharge Planning Group Note   04/01/2014 12:00 PM  Participation Quality:  Minimal  Mood/Affect:  Flat  Depression Rating:  "high"  Anxiety Rating:  "high"  Thoughts of Suicide:  Yes Will you contract for safety?   Yes  Current AVH:  No  Plan for Discharge/Comments:  Left to see Dr.  Len Blalock back and reported that he is happy that meds will be increased and there is no plan to d/c until after Christmas.  Cites no support.  When asked about sobriety plan, states that he will not attend AA nor NA as neither is good fit. Has attended groups at [facility] in past.  Bipolar and anger management.  But of course has not been attending those recently.  Will go by bus or [facility] transportation if he decides to attend again.  Transportation Means: bus  Supports: states he has none, though mother and brother are in area and he stays in contact with them  [place], [place] B

## 2014-04-02 DIAGNOSIS — F411 Generalized anxiety disorder: Secondary | ICD-10-CM

## 2014-04-02 DIAGNOSIS — F3130 Bipolar disorder, current episode depressed, mild or moderate severity, unspecified: Secondary | ICD-10-CM

## 2014-04-02 NOTE — [facility] Suicide Risk Assessment (Signed)
[facility] INPATIENT:  Family/Significant Other Suicide Prevention Education  Suicide Prevention Education:  Patient Refusal for Family/Significant Other Suicide Prevention Education: The patient [name] has refused to provide written consent for family/significant other to be provided Family/Significant Other Suicide Prevention Education during admission and/or prior to discharge.  Physician notified.  Daryel Gerald B 04/02/2014, 12:09 PM

## 2014-04-02 NOTE — Progress Notes (Signed)
Pt attended karaoke this evening.  

## 2014-04-02 NOTE — Progress Notes (Signed)
D: Pt passive SI/HI- towards [place] on the unit , but verbally contracts for safety.  Pt denies AVH. Pt is pleasant and cooperative. Pt interacting on unit , appropriate.   A: Pt was offered support and encouragement. Pt was given scheduled medications. Pt was encourage to attend groups. Q 15 minute checks were done for safety.   R:Pt attends groups and interacts well with peers and staff. Pt is taking medication. Pt has no complaints at this time .Pt receptive to treatment and safety maintained on unit.

## 2014-04-02 NOTE — Plan of Care (Signed)
Problem: Alteration in mood & ability to function due to Goal: LTG-Pt reports reduction in suicidal thoughts (Patient reports reduction in suicidal thoughts and is able to verbalize a safety plan for whenever patient is feeling suicidal)  Outcome: Progressing Pt denies SI at this time Goal: STG: Patient verbalizes decreases in signs of withdrawal Outcome: Completed/Met Date Met:  04/02/14 Pt has no withdrawal sx AEB pt statement.

## 2014-04-02 NOTE — Progress Notes (Signed)
Patient ID: [name], male   DOB: 02-23-1961, 53 y.o.   MRN: [id number]  D: Abyan has denied SI, HI, and AVH today. He has cx of pain and reported minimal relief from tramadol. He has been calm and appropriate on unit.   A: Medications given as ordered. q15 minute checks in place.   R: Pt remains free from harm and continues with treatment.

## 2014-04-02 NOTE — Progress Notes (Signed)
[facility] MD Progress Note  04/02/2014 12:02 PM [name]  MRN:  [id number] Subjective:  Reports he is not sleeping well due to disruptive behavior of others on teh unit. It is making him tired and irritable. Meds are not working so he is having SI/HI thoughts. Denies plan or intent. Depression is unchanged.  Anxiety is high. Denies AVH. Denies SE from meds.  Objective: Pt seen and chart reviewed. He is social and attending groups. No disruptive behavior noted.     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   Eyes: Negative.   Cardiovascular: Negative.   Gastrointestinal: Negative.   Genitourinary: Negative.   Musculoskeletal: Positive for back pain and joint pain.  Skin: Negative for rash.  Neurological: Negative.  Negative for headaches.  Endo/Heme/Allergies: Negative.   Psychiatric/Behavioral: Positive for depression and suicidal ideas. Negative for substance abuse. The patient is nervous/anxious and has insomnia.     Blood pressure 117/86, pulse 86, temperature 97.7 F (36.5 C), temperature source Oral, resp. rate 20, height 5\' 8"  (1.727 m), weight 118.389 kg (261 lb), SpO2 99 %.Body mass index is 39.69 kg/(m^2).  General Appearance: Fairly Groomed  ::  Fair  Speech:  Clear and Coherent, Slow and not spontaneous  Volume:  Normal  Mood:  Dysphoric but  Not hopeles   Affect:  Restricted  Thought Process:  Coherent and Goal Directed  Orientation:  Full (Time, Place, and Person)  Thought Content:  symptoms events worries concerns  Suicidal Thoughts:  Yes without plan no intent   Homicidal Thoughts:  Yes.  without intent/plan  Memory:  Immediate;    Fair Recent;   Fair Remote;   Fair  Judgement:  Fair  Insight:  Present  Psychomotor Activity:  Normal and Restlessness  Concentration:  Fair  Recall:  002.002.002.002 of Knowledge:Fair  Language: Fair  Akathisia:  No  Handed:  Denies  AIMS (if indicated):     Assets:  Desire for Improvement Housing  Sleep:  Number of Hours: 4.25   Musculoskeletal: Strength & Muscle Tone: decreased Gait & Station: uses cane, affected by back pain Patient leans: N/A  Current Medications: Current Facility-Administered Medications  Medication Dose Route Frequency Provider Last Rate Last Dose  . alum & mag hydroxide-simeth (MAALOX/MYLANTA) 200-200-20 MG/5ML suspension 30 mL  30 mL Oral Q4H PRN [employer], NP   30 mL at 03/29/14 1256  . ARIPiprazole (ABILIFY) tablet 10 mg  10 mg Oral Daily 03/31/14, MD   10 mg at 04/02/14 0836  . gabapentin (NEURONTIN) capsule 300 mg  300 mg Oral TID 04/04/14, NP   300 mg at 04/02/14 0836  . hydrOXYzine (ATARAX/VISTARIL) tablet 25 mg  25 mg Oral Q6H PRN 04/04/14, PA-C   25 mg at 04/02/14 0836  . ibuprofen (ADVIL,MOTRIN) tablet 400 mg  400 mg Oral Q4H PRN 04/04/14, MD   400 mg at 03/28/14 0650  . lisinopril (PRINIVIL,ZESTRIL) tablet 30 mg  30 mg Oral Daily 03/30/14, NP   30 mg at 04/02/14 0836  . lithium carbonate capsule 300 mg  300 mg Oral BID WC 04/04/14, NP   300 mg at 04/02/14 0836  . magnesium  hydroxide (MILK OF MAGNESIA) suspension 30 mL  30 mL Oral Daily PRN Nanine Means, NP      . methocarbamol (ROBAXIN) tablet 750 mg  750 mg Oral Q8H PRN Fransisca Kaufmann, NP   750 mg at 04/02/14 0836  . ondansetron (ZOFRAN-ODT) disintegrating tablet 4 mg  4 mg Oral Q6H PRN Alison Murray, MD   4 mg at 04/01/14 [phone]  . pantoprazole (PROTONIX) EC tablet 40 mg  40 mg Oral Daily Nanine Means, NP   40 mg at 04/02/14 0836  . traMADol (ULTRAM) tablet 50 mg  50 mg Oral Q6H PRN Fransisca Kaufmann, NP   50 mg at 04/02/14 5638  . zolpidem (AMBIEN) tablet 10 mg  10 mg Oral  QHS PRN Fransisca Kaufmann, NP   10 mg at 04/01/14 2153    Lab Results:  Results for orders placed or performed during the hospital encounter of 03/26/14 (from the past 48 hour(s))  Lithium level     Status: Abnormal   Collection Time: 04/01/14  6:25 AM  Result Value Ref Range   Lithium Lvl 0.15 (L) 0.80 - 1.40 mmol/L    Comment: Performed at [facility]    Physical Findings: AIMS: Facial and Oral Movements Muscles of Facial Expression: None, normal Lips and Perioral Area: None, normal Jaw: None, normal Tongue: None, normal,Extremity Movements Upper (arms, wrists, hands, fingers): None, normal Lower (legs, knees, ankles, toes): None, normal, Trunk Movements Neck, shoulders, hips: None, normal, Overall Severity Severity of abnormal movements (highest score from questions above): None, normal Incapacitation due to abnormal movements: None, normal Patient's awareness of abnormal movements (rate only patient's report): No Awareness, Dental Status Current problems with teeth and/or dentures?: Yes Does patient usually wear dentures?: No  CIWA:  CIWA-Ar Total: 1 COWS:  COWS Total Score: 1  Treatment Plan Summary: Daily contact with patient to assess and evaluate symptoms and progress in treatment Medication management  Plan: Supportive approach/coping skills/relapse prevention. Showing understanding of compliance            Mood Instability (Bipolar Disorder): placed on Lithium 300mg  BID, Abilify 10mg            Pain: Neurontin/Tramadol           Insomnia; Ambien 10mg  qHS, if wakes up then may take Vistaril 25mg  for midnight awakenings.  Pt may sleep in quiet room tonight if disruptive behavior from other pts continues on the unit.            Will get CW to be sure gun is secured before D/C     Medical Decision Making Problem Points:  Established problem, worsening (2) and Review of psycho-social stressors (1) Data Points:  Review of medication regiment & side effects  (2)  I certify that inpatient services furnished can reasonably be expected to improve the patient's condition.   04/02/2014, 12:02 PM

## 2014-04-02 NOTE — [facility] Group Notes (Signed)
The focus of this group is to educate the patient on the purpose and policies of crisis stabilization and provide a format to answer questions about their admission.  The group details unit policies and expectations of patients while admitted.  Patient did not attend 0900 nurse education orientation group this morning.  Patient stayed in his room. 

## 2014-04-02 NOTE — Plan of Care (Signed)
Problem: Alteration in mood & ability to function due to Goal: LTG-Pt reports reduction in suicidal thoughts (Patient reports reduction in suicidal thoughts and is able to verbalize a safety plan for whenever patient is feeling suicidal)  Outcome: Not Progressing Pt endorses SI-but contracts for safety Goal: LTG-Pt verbalizes understanding of importance of med regimen (Patient verbalizes understanding of importance of medication regimen and need to continue outpatient care and support groups)  Outcome: Progressing Pt lets writer know he will take medications as prescribed.  Problem: Diagnosis: Increased Risk For Suicide Attempt Goal: LTG-Patient Will Report Improved Mood and Deny Suicidal LTG (by discharge) Patient will report improved mood and deny suicidal ideation.  Outcome: Not Progressing Pt +ve SI at this time Goal: STG-Patient Will Attend All Groups On The Unit Outcome: Progressing Pt attends groups AEB documentation

## 2014-04-02 NOTE — [facility] Group Notes (Signed)
[facility] LCSW Aftercare Discharge Planning Group Note   04/02/2014 9:11 AM  Participation Quality:  Engaged  Mood/Affect:  Appropriate  Depression Rating:  6  Anxiety Rating:  6  Thoughts of Suicide:  No Will you contract for safety?   NA  Current AVH:  No  Plan for Discharge/Comments:  Mood is good, but when asked about symptoms, he expresses that he is still symptomatic, and, despite med changes yesterday, it is still too early too tell if it is helping.  Transportation Means: family  Supports: family  [place], Baldo Daub

## 2014-04-02 NOTE — Progress Notes (Signed)
D: Pt denies SI/HI/AVH. Pt is pleasant and cooperative. Pt stated he felt a little better today. Pt was not as irritable with other pt on unit.  A: Pt was offered support and encouragement. Pt was given scheduled medications. Pt was encourage to attend groups. Q 15 minute checks were done for safety.   R:Pt attends groups and interacts well with peers and staff. Pt is taking medication. Pt receptive to treatment and safety maintained on unit.

## 2014-04-02 NOTE — Progress Notes (Signed)
Pt  Was awakened by pt in 505. Pt was irritable stating he was tired of her doing this , he's been kept up "3 nights in a row" by this other pt .

## 2014-04-03 MED ORDER — HYDROXYZINE HCL 50 MG PO TABS
50.0000 mg | ORAL_TABLET | Freq: Four times a day (QID) | ORAL | Status: DC | PRN
  Administered 2014-04-04 – 2014-04-05 (×3): 50 mg via ORAL
  Filled 2014-04-03 (×3): qty 1

## 2014-04-03 MED ORDER — TRAMADOL HCL 50 MG PO TABS
100.0000 mg | ORAL_TABLET | Freq: Four times a day (QID) | ORAL | Status: DC | PRN
  Administered 2014-04-03 – 2014-04-06 (×7): 100 mg via ORAL
  Filled 2014-04-03 (×7): qty 2

## 2014-04-03 MED ORDER — LITHIUM CARBONATE 300 MG PO CAPS
450.0000 mg | ORAL_CAPSULE | Freq: Two times a day (BID) | ORAL | Status: DC
  Administered 2014-04-03 – 2014-04-06 (×6): 450 mg via ORAL
  Filled 2014-04-03 (×14): qty 1

## 2014-04-03 NOTE — Progress Notes (Signed)
[facility] MD Progress Note  04/03/2014 11:44 AM [name]  MRN:  [id number] Subjective:  Reports he is not sleeping well due to disruptive behavior of others on teh unit. It is making him tired and irritable. He wants to punch the disruptive pt in the face but states he will not b/c she is sick and he doesn't hurt women. Anxiety is high and he feels like he might be becoming manic.  Meds are not working yet. Denies SI.  He is somewhat depressed.  Anxiety is high. Denies AVH. Denies SE from meds.  States pain is high and he would like Tramadol dose increased.   Objective: Pt seen and chart reviewed. He is social and attending groups. No disruptive behavior noted. Having trouble sleeping due to other disruptive pts.    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   Eyes: Negative.   Cardiovascular: Negative.   Gastrointestinal: Negative.   Genitourinary: Negative.   Musculoskeletal: Positive for back pain and joint pain.  Skin: Negative for rash.  Neurological: Negative.  Negative for headaches.  Endo/Heme/Allergies: Negative.   Psychiatric/Behavioral: Positive for depression and suicidal ideas. Negative for substance abuse. The patient is nervous/anxious and has insomnia.     Blood pressure 155/101, pulse 108, temperature 97.8 F (36.6 C), temperature source Oral, resp. rate 17, height 5\' 8"  (1.727 m), weight 118.389 kg (261 lb), SpO2 100 %.Body mass index is 39.69 kg/(m^2).  General Appearance: Fairly Groomed  ::  Fair  Speech:  Clear and Coherent and Normal Rate  Volume:  Normal  Mood:  Dysphoric but  Not hopeles   Affect:  irritable  Thought Process:   Coherent and Goal Directed  Orientation:  Full (Time, Place, and Person)  Thought Content:  symptoms events worries concerns  Suicidal Thoughts:  no  Homicidal Thoughts:  Yes.  without intent/plan  Memory:  Immediate;   Fair Recent;   Fair Remote;   Fair  Judgement:  Fair  Insight:  Present  Psychomotor Activity:  Normal and Restlessness  Concentration:  Fair  Recall:  [id number] of Knowledge:Fair  Language: Fair  Akathisia:  No  Handed:  Denies  AIMS (if indicated):     Assets:  Desire for Improvement Housing  Sleep:  Number of Hours: 5.5   Musculoskeletal: Strength & Muscle Tone: decreased Gait & Station: uses cane, affected by back pain Patient leans: N/A  Current Medications: Current Facility-Administered Medications  Medication Dose Route Frequency Provider Last Rate Last Dose  . alum & mag hydroxide-simeth (MAALOX/MYLANTA) 200-200-20 MG/5ML suspension 30 mL  30 mL Oral Q4H PRN [employer], NP   30 mL at 03/29/14 1256  . ARIPiprazole (ABILIFY) tablet 10 mg  10 mg Oral Daily 03/31/14, MD   10 mg at 04/03/14 0804  . gabapentin (NEURONTIN) capsule 300 mg  300 mg Oral TID 04/05/14, NP   300 mg at 04/03/14 1142  . hydrOXYzine (ATARAX/VISTARIL) tablet 25 mg  25 mg Oral Q6H PRN 04/05/14, PA-C   25 mg at 04/02/14 1618  . ibuprofen (ADVIL,MOTRIN) tablet 400 mg  400 mg Oral Q4H PRN 04/04/14, MD   400 mg at 03/28/14 0650  . lisinopril (  PRINIVIL,ZESTRIL) tablet 30 mg  30 mg Oral Daily Fransisca Kaufmann, NP   30 mg at 04/03/14 0804  . lithium carbonate capsule 300 mg  300 mg Oral BID WC Sanjuana Kava, NP   300 mg at 04/03/14 0175  . magnesium hydroxide (MILK OF MAGNESIA) suspension 30 mL  30 mL Oral Daily PRN Nanine Means, NP      . methocarbamol (ROBAXIN) tablet 750 mg  750 mg Oral Q8H PRN Fransisca Kaufmann, NP   750 mg at 04/03/14 1025  . ondansetron (ZOFRAN-ODT) disintegrating tablet 4 mg  4 mg Oral Q6H PRN Alison Murray, MD   4 mg at 04/01/14 [id number]  . pantoprazole  (PROTONIX) EC tablet 40 mg  40 mg Oral Daily Nanine Means, NP   40 mg at 04/03/14 7824  . traMADol (ULTRAM) tablet 50 mg  50 mg Oral Q6H PRN Fransisca Kaufmann, NP   50 mg at 04/03/14 2353  . zolpidem (AMBIEN) tablet 10 mg  10 mg Oral QHS PRN Fransisca Kaufmann, NP   10 mg at 04/02/14 2202    Lab Results:  No results found for this or any previous visit (from the past 48 hour(s)).  Physical Findings: AIMS: Facial and Oral Movements Muscles of Facial Expression: None, normal Lips and Perioral Area: None, normal Jaw: None, normal Tongue: None, normal,Extremity Movements Upper (arms, wrists, hands, fingers): None, normal Lower (legs, knees, ankles, toes): None, normal, Trunk Movements Neck, shoulders, hips: None, normal, Overall Severity Severity of abnormal movements (highest score from questions above): None, normal Incapacitation due to abnormal movements: None, normal Patient's awareness of abnormal movements (rate only patient's report): No Awareness, Dental Status Current problems with teeth and/or dentures?: Yes Does patient usually wear dentures?: No  CIWA:  CIWA-Ar Total: 1 COWS:  COWS Total Score: 1  Treatment Plan Summary: Daily contact with patient to assess and evaluate symptoms and progress in treatment Medication management  Plan: Supportive approach/coping skills/relapse prevention. Showing understanding of compliance            Mood Instability (Bipolar Disorder): increase Lithium to 450mg  BID (level 0.15 on 04/01/14), continue Abilify 10mg            Pain: Neurontin 300mg  TID/Tramadol increase to 100mg  q6 hrs           Insomnia; Ambien 10mg  qHS, if wakes up then may take Vistaril 50mg  for midnight awakenings.  Pt may sleep in quiet room tonight if disruptive behavior from other pts continues on the unit.            Will get CW to be sure gun is secured before D/C     Medical Decision Making Problem Points:  Established problem, worsening (2) and Review of psycho-social stressors  (1) Data Points:  Review or order clinical lab tests (1) Review of medication regiment & side effects (2) Review of new medications or change in dosage (2)    I certify that inpatient services furnished can reasonably be expected to improve the patient's condition.   04/03/14 04/03/2014, 11:44 AM

## 2014-04-03 NOTE — Progress Notes (Signed)
D: Patient denies SI/HI and A/V hallucinations; patient reports sleep is poor ;reports appetite is good ; reports energy level is low ; reports ability to concentrate is poor; rates depression as 7/10; rates hopelessness 7/10; rates anxiety as 8/10; patient reports pain in his abdomen on the right side  A: Monitored q 15 minutes; patient encouraged to attend groups; patient educated about medications; patient given medications per physician orders; patient encouraged to express feelings and/or concerns  R: Patient is flat and depressed; patient is cooperative; patient is assertive and trying to control temperament in the acute milieu we have but does well with redirection;patient's interaction with staff and peers......; patient was able to set goal to talk with staff 1:1 when having feelings of SI; patient is taking medications as prescribed and tolerating medications; patient is attending all groups

## 2014-04-03 NOTE — Progress Notes (Signed)
D: Pt denies SI/AVH. Pt is pleasant and cooperative. Pt went to quiet room to sleep due to another pt waking him up 3 nights in a row. Pt continues to have HI towards another pt on the unit. Pt has shown great restraint not to act on feelings he has for the other pt.    A: Pt was offered support and encouragement. Pt was given scheduled medications. Pt was encourage to attend groups. Q 15 minute checks were done for safety.   R:Pt attends groups and interacts well with peers and staff. Pt is taking medication. Pt has no complaints at this time .Pt receptive to treatment and safety maintained on unit.

## 2014-04-04 NOTE — [facility] Group Notes (Signed)
[facility] LCSW Group Therapy  04/04/2014 12:47 PM  Type of Therapy and Topic:  Group Therapy: Avoiding Self-Sabotaging and Enabling Behaviors  Participation Level:  engaged, articulate  Mood: irritable  Description of Group:     Learn how to identify obstacles, self-sabotaging and enabling behaviors, what are they, why do we do them and what needs do these behaviors meet? Discuss unhealthy relationships and how to have positive healthy boundaries with those that sabotage and enable. Explore aspects of self-sabotage and enabling in yourself and how to limit these self-destructive behaviors in everyday life. A scaling question is used to help patient look at where they are now in their motivation to change, from 1 to 10 (lowest to highest motivation).  Therapeutic Goals: 1. Patient will identify one obstacle that relates to self-sabotage and enabling behaviors 2. Patient will identify one personal self-sabotaging or enabling behavior they did prior to admission 3. Patient able to establish a plan to change the above identified behavior they did prior to admission:   4. Patient will demonstrate ability to communicate their needs through discussion and/or role plays.   Summary of Patient Progress: Patient impatient w peer's statements re self responsibility for self sabotage, says "I shouldn't have come here, I should have gone to the [name] where people are more like me."  States he takes responsibility for changing his dysfunctional behaviors, was able to draw parallel for group of warrior preparing for battle, says "it takes more time to prepare", encouraged peers to take time to prepare for life change and practice using coping skills in advance.  Therapeutic Modalities:    Cognitive Behavioral Therapy Person-Centered Therapy Motivational Interviewing  [place], [place] Clinical Social Worker 04/04/2014    Sallee Lange 04/04/2014, 12:47 PM

## 2014-04-04 NOTE — Plan of Care (Signed)
Problem: Ineffective individual coping Goal: LTG: Patient will report a decrease in negative feelings Outcome: Progressing Pt denies SI/AVH at this time Goal: STG:Pt. will utilize relaxation techniques to reduce stress STG: Patient will utilize relaxation techniques to reduce stress levels  Outcome: Progressing Pt has remained calm on the unit when his anxiety increased due to another pt on the unit

## 2014-04-04 NOTE — [facility] Group Notes (Signed)
Adult Psychoeducational Group Note  Date:  04/04/2014 Time:  11:55 AM  Group Topic/Focus:  Recovery Goals: Establishing healthy coping skills  Participation Level:  Appropriate  Participation Quality:  Attentive  Affect:  Blunted  Cognitive:  Alert and Appropriate  Insight: Good  Engagement in Group:  Improving  Modes of Intervention:  Clarification and Discussion  Additional Comments:    Barton Fanny 04/04/2014, 11:47 AM

## 2014-04-04 NOTE — Progress Notes (Signed)
D: Pt denies SI/HI/AVH. Pt is irritated and agitated, due to conflict between another pt that has been going on for a few days.   A: Pt was offered support and encouragement. Pt was given scheduled medications. Pt was encourage to attend groups. Q 15 minute checks were done for safety.   R:Pt attends groups and interacts well with peers and staff. Pt is taking medication.Pt receptive to treatment and safety maintained on unit. Pt took night meds and went to bed .

## 2014-04-04 NOTE — Progress Notes (Signed)
[facility] MD Progress Note  04/04/2014 10:40 AM [name]  MRN:  [id number] Subjective:  Reports he is slept better last night in the quiet room. As a result he is feeling a little better today. Yesterday he found out his cousin died and it is contributing to his depression. States he is ready to go home and wonders if he can be d/c on Monday.  He wants to punch the disruptive pt in the face but states he will not b/c she is sick and he doesn't hurt women. Anxiety is high but he is not taking Vistaril.  Meds are not working yet. Denies SI. Denies AVH. Denies SE from meds.  Objective: Pt seen and chart reviewed. He is social and attending groups. No disruptive behavior noted. Having trouble sleeping due to other disruptive pts and slept in the quiet room last night.  He continues to endorse depression and HI toward disruptive pt.    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   Eyes: Negative.   Cardiovascular: Negative.   Gastrointestinal: Negative.   Genitourinary: Negative.   Musculoskeletal: Positive for back pain and joint pain.  Skin: Negative for rash.  Neurological: Negative.  Negative for headaches.  Endo/Heme/Allergies: Negative.   Psychiatric/Behavioral: Positive for depression. Negative for suicidal ideas and substance abuse. The patient is nervous/anxious. The patient does not have insomnia.     Blood pressure 126/102, pulse 133, temperature 98.2 F (36.8 C), temperature source Oral, resp. rate 18, height 5\' 8"  (1.727 m), weight 118.389 kg (261 lb), SpO2 100 %.Body mass index is 39.69 kg/(m^2).  General Appearance: Fairly Groomed  ::  Fair  Speech:   Clear and Coherent and Normal Rate  Volume:  Normal  Mood:  Dysphoric but  Not hopeles   Affect:  Congruent appears a little brighter today.   Thought Process:  Coherent and Goal Directed  Orientation:  Full (Time, Place, and Person)  Thought Content:  Negative  Suicidal Thoughts:  no  Homicidal Thoughts:  Yes.  without intent/plan  Memory:  Immediate;   Fair Recent;   Fair Remote;   Fair  Judgement:  Fair  Insight:  Present  Psychomotor Activity:  Normal  Concentration:  Fair  Recall:  002.002.002.002 of Knowledge:Fair  Language: Fair  Akathisia:  No  Handed:  Denies  AIMS (if indicated):     Assets:  Desire for Improvement Housing  Sleep:  Number of Hours: 4.75   Musculoskeletal: Strength & Muscle Tone: decreased Gait & Station: uses cane, affected by back pain Patient leans: N/A  Current Medications: Current Facility-Administered Medications  Medication Dose Route Frequency Provider Last Rate Last Dose  . alum & mag hydroxide-simeth (MAALOX/MYLANTA) 200-200-20 MG/5ML suspension 30 mL  30 mL Oral Q4H PRN [employer], NP   30 mL at 03/29/14 1256  . ARIPiprazole (ABILIFY) tablet 10 mg  10 mg Oral Daily 03/31/14, MD   10 mg at 04/04/14 0805  . gabapentin (NEURONTIN) capsule 300 mg  300 mg Oral TID 04/06/14, NP   300 mg at 04/04/14 0805  . hydrOXYzine (ATARAX/VISTARIL) tablet 50 mg  50 mg Oral Q6H PRN 04/06/14, MD   50 mg at 04/04/14  2542  . ibuprofen (ADVIL,MOTRIN) tablet 400 mg  400 mg Oral Q4H PRN Jomarie Longs, MD   400 mg at 03/28/14 0650  . lisinopril (PRINIVIL,ZESTRIL) tablet 30 mg  30 mg Oral Daily Fransisca Kaufmann, NP   30 mg at 04/04/14 0818  . lithium carbonate capsule 450 mg  450 mg Oral BID WC Oletta Darter, MD   450 mg at 04/04/14 0805  . magnesium hydroxide (MILK OF MAGNESIA) suspension 30 mL  30 mL Oral Daily PRN Nanine Means, NP      . methocarbamol (ROBAXIN) tablet 750 mg  750 mg Oral Q8H PRN Fransisca Kaufmann, NP   750 mg at 04/04/14 0805  . ondansetron  (ZOFRAN-ODT) disintegrating tablet 4 mg  4 mg Oral Q6H PRN Alison Murray, MD   4 mg at 04/01/14 [phone]  . pantoprazole (PROTONIX) EC tablet 40 mg  40 mg Oral Daily Nanine Means, NP   40 mg at 04/04/14 0820  . traMADol (ULTRAM) tablet 100 mg  100 mg Oral Q6H PRN Oletta Darter, MD   100 mg at 04/04/14 0805  . zolpidem (AMBIEN) tablet 10 mg  10 mg Oral QHS PRN Fransisca Kaufmann, NP   10 mg at 04/03/14 2250    Lab Results:  No results found for this or any previous visit (from the past 48 hour(s)).  Physical Findings: AIMS: Facial and Oral Movements Muscles of Facial Expression: None, normal Lips and Perioral Area: None, normal Jaw: None, normal Tongue: None, normal,Extremity Movements Upper (arms, wrists, hands, fingers): None, normal Lower (legs, knees, ankles, toes): None, normal, Trunk Movements Neck, shoulders, hips: None, normal, Overall Severity Severity of abnormal movements (highest score from questions above): None, normal Incapacitation due to abnormal movements: None, normal Patient's awareness of abnormal movements (rate only patient's report): No Awareness, Dental Status Current problems with teeth and/or dentures?: Yes Does patient usually wear dentures?: No  CIWA:  CIWA-Ar Total: 1 COWS:  COWS Total Score: 1  Treatment Plan Summary: Daily contact with patient to assess and evaluate symptoms and progress in treatment Medication management  Plan: Supportive approach/coping skills/relapse prevention. Showing understanding of compliance            Mood Instability (Bipolar Disorder): Lithium to 450mg  BID (level 0.15 on 04/01/14), continue Abilify 10mg            Pain: Neurontin 300mg  TID/Tramadol 100mg  q6 hrs           Insomnia; Ambien 10mg  qHS, if wakes up then may take Vistaril 50mg  for midnight awakenings.  Pt may sleep in quiet room tonight if disruptive behavior from other pts continues on the unit.            Will get CW to be sure gun is secured before D/C     Medical  Decision Making Problem Points:  Established problem, worsening (2) and Review of psycho-social stressors (1) Data Points:  Review of new medications or change in dosage (2)    I certify that inpatient services furnished can reasonably be expected to improve the patient's condition.   Lakechia Nay 04/04/2014, 10:40 AM

## 2014-04-04 NOTE — Progress Notes (Signed)
Pt did not attend wrap up group this evening.  

## 2014-04-04 NOTE — Progress Notes (Signed)
D: Patient denies SI/HI and A/V hallucinations;patient reports sleep is good; reports appetite is good; reports energy level is low ; reports ability to concentrate is poor; rates depression as 7/10; rates hopelessness 7/10; rates anxiety as 7/10;  A: Monitored q 15 minutes; patient encouraged to attend groups; patient educated about medications; patient given medications per physician orders; patient encouraged to express feelings and/or concerns  R: Patient is cooperative; patient is flat and blunted; patient is cooperative and engages during group; patient's interaction with staff and peers is appropriate; patient was able to set goal to talk with staff 1:1 when having feelings of SI; patient is taking medications as prescribed and tolerating medications

## 2014-04-04 NOTE — Plan of Care (Signed)
Problem: Ineffective individual coping Goal: STG:Pt. will utilize relaxation techniques to reduce stress STG: Patient will utilize relaxation techniques to reduce stress levels  Outcome: Progressing Pt used quiet room to help sleep

## 2014-04-05 NOTE — Progress Notes (Signed)
[name] has spent the majority of his day sleeping in his bed. He maintains poor eye contact. He answers in one-word answers and he is flat, blunted and shows no emotion.   A He  Did complete his morning assessment and on it he  Wrote he denied SI within the past 24 hrs, and he rated his depression, hopelessness and anxiety "3/1/0", respectively.   R Safety maintained.

## 2014-04-05 NOTE — [facility] Group Notes (Signed)
LCSW Group Therapy Note  Date:  04/05/2014 Time:  1:15 PM  Group Topic/Focus: Feelings about discharge and creating support network  Participation Level:  Active  Participation Quality:  Appropriate and Attentive  Affect:  Blunted  Cognitive:  Appropriate  Insight: Improving  Engagement in Group:  Engaged  Modes of Intervention:  Clarification, Discussion, Education, Socialization and Support  Additional Comments:  Pt was observant as others processed their anxiety about discharge and described healthy supports. The main focus of today's process group was to identify the patient's current support system and decide on other supports that can be put in place. An emphasis was placed on using counselor, doctor, therapy groups, 12-step groups, and problem-specific support groups to expand supports. There was also an extensive discussion about what constitutes a healthy support versus an unhealthy support. The patient expressed full comprehension of the concepts presented, and agreed that there is a need to add more supports. The patient stated he intends to add a therapist/counselor in this area as it is difficult for him to get to [facility] on regular basis. Kajuan shared his disappointment that family whom he contacted today requesting help w transportation at discharge were not open to helping. Armel appeared to process the fact that family will never take mental health issues seriously and stated he is trying to accept that. He also shared how he and another patient were able to resolve a recent conflict and he wished it were that easy with family.    Clide Dales 04/05/2014

## 2014-04-05 NOTE — Plan of Care (Signed)
Problem: Diagnosis: Increased Risk For Suicide Attempt Goal: LTG-Patient Will Report Improved Mood and Deny Suicidal LTG (by discharge) Patient will report improved mood and deny suicidal ideation.  Outcome: Progressing Pt denies SI at this time, mood has been labile at this time.

## 2014-04-05 NOTE — Progress Notes (Signed)
Patient ID: [name], male   DOB: May 10, 1960, 53 y.o.   MRN: [id number] [facility] MD Progress Note  04/05/2014 11:46 AM [name]  MRN:  [id number] Subjective:  Reports he is slept better last night but energy is low today. He is feeling a little lightheaded from his meds. States meds are starting to work. He is no longer irritable but is depressed due to his cousins death. Pt States he is ready for d/c.  Denies SI/HI. Denies AVH. Denies SE from meds.  Objective: Pt seen and chart reviewed. He is social and attending groups. No disruptive behavior noted. He continues to endorse depression. No longer having HI toward disruptive pt.    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   Eyes: Negative.   Cardiovascular: Negative.   Gastrointestinal: Negative.   Genitourinary: Negative.   Musculoskeletal: Positive for back pain and joint pain.  Skin: Negative for rash.  Neurological: Negative.  Negative for headaches.  Endo/Heme/Allergies: Negative.   Psychiatric/Behavioral: Positive for depression. Negative for suicidal ideas and substance abuse. The patient is not nervous/anxious and does not have insomnia.     Blood pressure 141/11, pulse 120, temperature 97.1 F (36.2 C), temperature source Oral, resp. rate 18, height 5\' 8"  (1.727 m), weight 118.389 kg (261 lb), SpO2 100 %.Body mass index is 39.69 kg/(m^2).  General Appearance: Fairly Groomed  ::  Fair  Speech:  Clear and Coherent and Normal Rate  Volume:  Normal  Mood:  Dysphoric but  Not hopeles   Affect:  Congruent appears a little brighter today.   Thought Process:  Coherent and Goal Directed   Orientation:  Full (Time, Place, and Person)  Thought Content:  Negative  Suicidal Thoughts:  no  Homicidal Thoughts:  Yes.  without intent/plan  Memory:  Immediate;   Fair Recent;   Fair Remote;   Fair  Judgement:  Fair  Insight:  Present  Psychomotor Activity:  Normal  Concentration:  Fair  Recall:  [id number] of Knowledge:Fair  Language: Fair  Akathisia:  No  Handed:  Denies  AIMS (if indicated):     Assets:  Desire for Improvement Housing  Sleep:  Number of Hours: 4.75   Musculoskeletal: Strength & Muscle Tone: decreased Gait & Station: uses cane, affected by back pain Patient leans: N/A  Current Medications: Current Facility-Administered Medications  Medication Dose Route Frequency Provider Last Rate Last Dose  . alum & mag hydroxide-simeth (MAALOX/MYLANTA) 200-200-20 MG/5ML suspension 30 mL  30 mL Oral Q4H PRN [employer], NP   30 mL at 03/29/14 1256  . ARIPiprazole (ABILIFY) tablet 10 mg  10 mg Oral Daily 03/31/14, MD   10 mg at 04/05/14 0953  . gabapentin (NEURONTIN) capsule 300 mg  300 mg Oral TID 04/07/14, NP   300 mg at 04/05/14 0953  . hydrOXYzine (ATARAX/VISTARIL) tablet 50 mg  50 mg Oral Q6H PRN 04/07/14, MD   50 mg at 04/04/14 2024  . ibuprofen (ADVIL,MOTRIN) tablet 400 mg  400 mg Oral Q4H PRN 2025, MD   400 mg at 03/28/14 0650  . lisinopril (PRINIVIL,ZESTRIL) tablet 30 mg  30 mg Oral Daily 03/30/14, NP   30  mg at 04/05/14 0953  . lithium carbonate capsule 450 mg  450 mg Oral BID WC Oletta Darter, MD   450 mg at 04/05/14 0953  . magnesium hydroxide (MILK OF MAGNESIA) suspension 30 mL  30 mL Oral Daily PRN Nanine Means, NP      . methocarbamol (ROBAXIN) tablet 750 mg  750 mg Oral Q8H PRN Fransisca Kaufmann, NP   750 mg at 04/05/14 0958  . ondansetron (ZOFRAN-ODT) disintegrating tablet 4 mg  4 mg Oral Q6H PRN Alison Murray, MD   4 mg at 04/01/14 [phone]  . pantoprazole (PROTONIX) EC tablet 40 mg  40 mg Oral Daily Nanine Means, NP   40 mg at  04/05/14 6789  . traMADol (ULTRAM) tablet 100 mg  100 mg Oral Q6H PRN Oletta Darter, MD   100 mg at 04/05/14 3810  . zolpidem (AMBIEN) tablet 10 mg  10 mg Oral QHS PRN Fransisca Kaufmann, NP   10 mg at 04/04/14 2100    Lab Results:  No results found for this or any previous visit (from the past 48 hour(s)).  Physical Findings: AIMS: Facial and Oral Movements Muscles of Facial Expression: None, normal Lips and Perioral Area: None, normal Jaw: None, normal Tongue: None, normal,Extremity Movements Upper (arms, wrists, hands, fingers): None, normal Lower (legs, knees, ankles, toes): None, normal, Trunk Movements Neck, shoulders, hips: None, normal, Overall Severity Severity of abnormal movements (highest score from questions above): None, normal Incapacitation due to abnormal movements: None, normal Patient's awareness of abnormal movements (rate only patient's report): No Awareness, Dental Status Current problems with teeth and/or dentures?: Yes Does patient usually wear dentures?: No  CIWA:  CIWA-Ar Total: 1 COWS:  COWS Total Score: 1  Treatment Plan Summary: Daily contact with patient to assess and evaluate symptoms and progress in treatment Medication management  Plan: Supportive approach/coping skills/relapse prevention. Showing understanding of compliance            Mood Instability (Bipolar Disorder): Lithium to 450mg  BID (level 0.15 on 04/01/14), continue Abilify 10mg            Pain: Neurontin 300mg  TID/Tramadol 100mg  q6 hrs           Insomnia; Ambien 10mg  qHS, if wakes up then may take Vistaril 50mg  for midnight awakenings.  Pt may sleep in quiet room tonight if disruptive behavior from other pts continues on the unit.            Will get CW to be sure gun is secured before D/C   If symptoms continue to improve will d/c Monday  Medical Decision Making Problem Points:  Established problem, stable/improving (1) and Review of psycho-social stressors (1) Data Points:  Review of  medication regiment & side effects (2)    I certify that inpatient services furnished can reasonably be expected to improve the patient's condition.   04/05/2014, 11:46 AM

## 2014-04-05 NOTE — Progress Notes (Signed)
Adult Psychoeducational Group Note  Date:  04/05/2014 Time:  9:55 PM  Group Topic/Focus:  Wrap-Up Group:   The focus of this group is to help patients review their daily goal of treatment and discuss progress on daily workbooks.  Participation Level:  Active  Participation Quality:  Appropriate  Affect:  Appropriate  Cognitive:  Appropriate  Insight: Appropriate  Engagement in Group:  Engaged  Modes of Intervention:  Discussion  Additional Comments:The patient expressed that he feels a support system is when friends and family listen to you.  Octavio Manns 04/05/2014, 9:55 PM

## 2014-04-06 ENCOUNTER — Encounter (HOSPITAL_COMMUNITY): Payer: Self-pay | Admitting: Psychiatry

## 2014-04-06 DIAGNOSIS — F1420 Cocaine dependence, uncomplicated: Secondary | ICD-10-CM | POA: Insufficient documentation

## 2014-04-06 DIAGNOSIS — F1020 Alcohol dependence, uncomplicated: Secondary | ICD-10-CM | POA: Insufficient documentation

## 2014-04-06 DIAGNOSIS — F3130 Bipolar disorder, current episode depressed, mild or moderate severity, unspecified: Secondary | ICD-10-CM | POA: Insufficient documentation

## 2014-04-06 DIAGNOSIS — R1084 Generalized abdominal pain: Secondary | ICD-10-CM | POA: Insufficient documentation

## 2014-04-06 DIAGNOSIS — R109 Unspecified abdominal pain: Secondary | ICD-10-CM | POA: Insufficient documentation

## 2014-04-06 MED ORDER — HYDROCORTISONE 2.5 % RE CREA: 1.0000 "application " | TOPICAL_CREAM | Freq: Two times a day (BID) | RECTAL | Status: DC

## 2014-04-06 MED ORDER — GABAPENTIN 300 MG PO CAPS: 300.0000 mg | ORAL_CAPSULE | Freq: Three times a day (TID) | ORAL | Status: DC

## 2014-04-06 MED ORDER — PANTOPRAZOLE SODIUM 40 MG PO TBEC: 40.0000 mg | DELAYED_RELEASE_TABLET | Freq: Every day | ORAL | Status: DC

## 2014-04-06 MED ORDER — TRAMADOL HCL 50 MG PO TABS: 100.0000 mg | ORAL_TABLET | Freq: Four times a day (QID) | ORAL | Status: DC | PRN

## 2014-04-06 MED ORDER — METHOCARBAMOL 750 MG PO TABS: 750.0000 mg | ORAL_TABLET | Freq: Three times a day (TID) | ORAL | Status: DC | PRN

## 2014-04-06 MED ORDER — LITHIUM CARBONATE 150 MG PO CAPS
450.0000 mg | ORAL_CAPSULE | Freq: Two times a day (BID) | ORAL | Status: DC
  Filled 2014-04-06: qty 18

## 2014-04-06 MED ORDER — ARIPIPRAZOLE 10 MG PO TABS: 10.0000 mg | ORAL_TABLET | Freq: Every day | ORAL | Status: DC

## 2014-04-06 MED ORDER — HYDROCORTISONE ACETATE 25 MG RE SUPP: 25.0000 mg | Freq: Two times a day (BID) | RECTAL | Status: DC

## 2014-04-06 MED ORDER — ZOLPIDEM TARTRATE 10 MG PO TABS: 10.0000 mg | ORAL_TABLET | Freq: Every evening | ORAL | Status: AC | PRN

## 2014-04-06 MED ORDER — LITHIUM CARBONATE 150 MG PO CAPS: 450.0000 mg | ORAL_CAPSULE | Freq: Two times a day (BID) | ORAL | Status: DC

## 2014-04-06 MED ORDER — LISINOPRIL 30 MG PO TABS: 30.0000 mg | ORAL_TABLET | Freq: Every day | ORAL | Status: DC

## 2014-04-06 NOTE — Progress Notes (Signed)
[facility] Adult Case Management Discharge Plan :  Will you be returning to the same living situation after discharge: Yes,  home At discharge, do you have transportation home?:Yes,  neighbor coming to pick him up today Do you have the ability to pay for your medications:Yes,  [employer] medicare/medicaid. Also [facility] connected. (see follow-up below)   Release of information consent forms completed and submitted to medical records by CSW.  Patient to Follow up at: Follow-up Information    Follow up with [facility] On 04/13/2014.   Why:  Appt for hospital follow-up on this date at 2pm.    Contact information:   [address]  [place], [place]  Phone: [phone] or [phone]       Patient denies SI/HI:   Yes,  during group/self report.     Safety Planning and Suicide Prevention discussed:  Yes,  SPE completed with pt, as he refused to consent to family contact. SPI pamphlet provided to pt and he was encouraged to share information with support network, ask questions, and talk about any concerns relating to SPE.  Smart, Zareena Willis LCSWA  04/06/2014, 10:33 AM

## 2014-04-06 NOTE — BHH Suicide Risk Assessment (Signed)
   Demographic Factors:  Living alone  Total Time spent with patient: 45 minutes  Psychiatric Specialty Exam: Physical Exam  ROS  Blood pressure 139/103, pulse 123, temperature 97.9 F (36.6 C), temperature source Oral, resp. rate 20, height 5\' 8"  (1.727 m), weight 118.389 kg (261 lb), SpO2 100 %.Body mass index is 39.69 kg/(m^2).  General Appearance: Casual  Eye Contact::  Good  Speech:  Clear and Coherent  Volume:  Normal  Mood:  Euthymic  Affect:  Congruent  Thought Process:  Goal Directed  Orientation:  Full (Time, Place, and Person)  Thought Content:  WDL  Suicidal Thoughts:  No  Homicidal Thoughts:  No  Memory:  Immediate;   Fair Recent;   Fair Remote;   Fair  Judgement:  Fair  Insight:  Fair  Psychomotor Activity:  Normal  Concentration:  Good  Recall:  002.002.002.002 of Knowledge:Fair  Language: Good  Akathisia:  No  Handed:  Right  AIMS (if indicated):     Assets:  Communication Skills  Sleep:  Number of Hours: 4    Musculoskeletal: Strength & Muscle Tone: within normal limits Gait & Station: normal Patient leans: N/A   Mental Status Per Nursing Assessment::   On Admission:  Suicidal ideation indicated by patient  Current Mental Status by Physician: Patient denies SI/HI/AH/VH ,is pleasant ,reports medications as working for him.  Loss Factors: Decline in physical health  Historical Factors: Impulsivity  Risk Reduction Factors:   Positive therapeutic relationship  Continued Clinical Symptoms:  Previous Psychiatric Diagnoses and Treatments Medical Diagnoses and Treatments/Surgeries  Cognitive Features That Contribute To Risk:  Polarized thinking    Suicide Risk:  Minimal: No identifiable suicidal ideation.    Discharge Diagnoses:  Primary Psychiatric Diagnosis:  Bipolar disorder type I,most recent episode ,depressed,severe without psychosis (improved)   Secondary Psychiatric Diagnosis: Stimulant use disorder ,severe ,cocaine  type Alcohol use disorder ,severe Cannabis use disorder,severe   Non Psychiatric Diagnosis: See PMH     Past Medical History  Diagnosis Date  . Arthritis     per pt rheumatoid   . Hypertension   . Anxiety   . Bipolar affective disorder   . Depression   . Alcohol abuse   . Cocaine abuse     smokes crack-cocaine  . GERD (gastroesophageal reflux disease)   . Chronic back pain   . Tobacco abuse   . Hepatitis C   . Vitamin D deficiency   . Gout   . Hemorrhoids   . Diabetes mellitus without complication     Plan Of Care/Follow-up recommendations:  Activity:  no restrictions Diet:  Heart  Is patient on multiple antipsychotic therapies at discharge:  No   Has Patient had three or more failed trials of antipsychotic monotherapy by history:  No  Recommended Plan for Multiple Antipsychotic Therapies: NA    Oree Mirelez MD 04/06/2014, 9:33 AM

## 2014-04-06 NOTE — Tx Team (Signed)
  Interdisciplinary Treatment Plan Update   Date Reviewed:  04/06/2014  Time Reviewed:  10:34 AM  Progress in Treatment:   Attending groups: Yes Participating in groups: Yes Taking medication as prescribed: Yes  Tolerating medication: Yes Family/Significant other contact made: Pt refused family contact. SPE completed with pt.  Patient understands diagnosis: Yes  Discussing patient identified problems/goals with staff: Yes  See initial care plan Medical problems stabilized or resolved: Yes Denies suicidal/homicidal ideation: Yes  In tx team Patient has not harmed self or others: Yes  For review of initial/current patient goals, please see plan of care.  Estimated Length of Stay: d/c today  Reason for Continuation of Hospitalization: none   New Problems/Goals identified:  N/A  Discharge Plan or Barriers:   return home, follow up [facility]. See AVS  Additional Comments:  Says Tennis Must was hard because of daughters and wife death 2 years ago. Claims that contrary to other times he has a place to live. He understands things piled up and his poor judjement of drinking made him worse. States he is not being able to handle it. He has been off medications for all this time. So understands that it is going to be a while before the medications work for him. He still endorses SI. States not want to die and continue help.  Med changes today include: Double Abilify to 10, Increase Lisinopril to 30.  12/28: Pt reports that he is happy to be d/cing today and returning to "normal life." Pt's neighbor will pick him up and he plans to follow[facility]. Pt pleasant with calm affect during group. Reports low depression with "moderate" anxiety-"it's middle of the road--that's normal for me." Pt reports that anxiety is manageable. He denies SI/HI/AVH.   Attendees:  Signature: Dr. Elna Breslow, MD 04/06/2014 10:34 AM   Signature: Trula Slade, LCSWA  04/06/2014 10:34 AM  Signature:  Santa Genera, LCSW  04/06/2014 10:34 AM  Signature: Dorathy Daft, Foy Gu[place] RN 04/06/2014 10:34 AM  Signature: [facility] Care Coordinator 04/06/2014 10:34 AM  Signature:  04/06/2014 10:34 AM  Signature:   04/06/2014 10:34 AM  Signature:    Signature:    Signature:    Signature:    Signature:    Signature:      Scribe for Treatment Team:   Trula Slade, LCWA   04/06/2014 10:34 AM

## 2014-04-06 NOTE — Progress Notes (Signed)
Pt was discharged home today.  He denied any S/I H/I or A/V hallucinations.  He was given f/u appointment, rx, sample medications, and a hotline info booklet.  He voiced understanding to all instructions provided.  He declined the need for smoking cessation materials.

## 2014-04-06 NOTE — B[facility] Group Notes (Signed)
[facility] LCSW Aftercare Discharge Planning Group Note   04/06/2014 11:08 AM  Participation Quality:  Appropriate  Mood/Affect:  Appropriate  Depression Rating:  0  Anxiety Rating:  5-6  Thoughts of Suicide:  No Will you contract for safety?   NA  Current AVH:  No  Plan for Discharge/Comments:  Pt reports that he is ready to d/c this morning. "I'm going back home." [facility] that was verified by CSW this morning. Pt provided with AA/NA list for [facility].   Transportation Means: neighbor   Supports: family and friend supports identified.   Smart, [employer]

## 2014-04-06 NOTE — Progress Notes (Signed)
D: Pt approached the writer and stated, "I'm leaving in the morning".  When asked about his plans pt stated he plans to return to his apt. Stated, "I just wanted to come and get my medicines together". Pt also asked if he would be getting a 2 wk supply of his meds, stating that it will take time to get them from the [name].  A: Informed pt that his concerns would be passed on to his Dr.  Joanna Puff and encouragement was offered. 15 min checks continued for safety.  R: Pt remains safe.

## 2014-04-08 NOTE — Progress Notes (Signed)
Patient Discharge Instructions:  After Visit Summary (AVS):   Faxed to:  04/08/14 Discharge Summary Note:   Faxed to:  04/08/14 Psychiatric Admission Assessment Note:   Faxed to:  04/08/14 Suicide Risk Assessment - Discharge Assessment:   Faxed to:  04/08/14 Faxed/Sent to the Next Level Care provider:  04/08/14 Faxed to [facility] @ [phone] Jerelene Redden, 04/08/2014, 3:41 PM

## 2014-08-19 DIAGNOSIS — E669 Obesity, unspecified: Secondary | ICD-10-CM | POA: Diagnosis not present

## 2014-08-19 DIAGNOSIS — Z7689 Persons encountering health services in other specified circumstances: Secondary | ICD-10-CM | POA: Diagnosis not present

## 2014-08-19 DIAGNOSIS — G8929 Other chronic pain: Secondary | ICD-10-CM | POA: Diagnosis not present

## 2014-08-19 DIAGNOSIS — Z131 Encounter for screening for diabetes mellitus: Secondary | ICD-10-CM | POA: Diagnosis not present

## 2014-08-19 DIAGNOSIS — Z720 Tobacco use: Secondary | ICD-10-CM | POA: Diagnosis not present

## 2014-09-25 DIAGNOSIS — I10 Essential (primary) hypertension: Secondary | ICD-10-CM | POA: Diagnosis not present

## 2014-09-25 DIAGNOSIS — Z7689 Persons encountering health services in other specified circumstances: Secondary | ICD-10-CM | POA: Diagnosis not present

## 2014-09-25 DIAGNOSIS — G8929 Other chronic pain: Secondary | ICD-10-CM | POA: Diagnosis not present

## 2014-09-25 DIAGNOSIS — E669 Obesity, unspecified: Secondary | ICD-10-CM | POA: Diagnosis not present

## 2014-09-25 DIAGNOSIS — K921 Melena: Secondary | ICD-10-CM | POA: Diagnosis not present

## 2014-09-25 DIAGNOSIS — R000 Tachycardia, unspecified: Secondary | ICD-10-CM | POA: Diagnosis not present

## 2014-10-02 DIAGNOSIS — I10 Essential (primary) hypertension: Secondary | ICD-10-CM | POA: Diagnosis not present

## 2014-10-02 DIAGNOSIS — R000 Tachycardia, unspecified: Secondary | ICD-10-CM | POA: Diagnosis not present

## 2014-10-30 DIAGNOSIS — G8929 Other chronic pain: Secondary | ICD-10-CM | POA: Diagnosis not present

## 2014-10-30 DIAGNOSIS — R000 Tachycardia, unspecified: Secondary | ICD-10-CM | POA: Diagnosis not present

## 2014-10-30 DIAGNOSIS — I10 Essential (primary) hypertension: Secondary | ICD-10-CM | POA: Diagnosis not present

## 2014-10-30 DIAGNOSIS — E669 Obesity, unspecified: Secondary | ICD-10-CM | POA: Diagnosis not present

## 2014-10-30 DIAGNOSIS — K921 Melena: Secondary | ICD-10-CM | POA: Diagnosis not present

## 2014-12-16 ENCOUNTER — Encounter (HOSPITAL_COMMUNITY): Payer: Self-pay | Admitting: Family Medicine

## 2014-12-16 ENCOUNTER — Emergency Department (HOSPITAL_COMMUNITY): Payer: Medicare Other

## 2014-12-16 ENCOUNTER — Emergency Department (HOSPITAL_COMMUNITY)
Admission: EM | Admit: 2014-12-16 | Discharge: 2014-12-17 | Disposition: A | Discharge status: Home / Self Care | Payer: Medicare Other | Attending: Emergency Medicine | Admitting: Emergency Medicine

## 2014-12-16 DIAGNOSIS — Z720 Tobacco use: Secondary | ICD-10-CM | POA: Diagnosis not present

## 2014-12-16 DIAGNOSIS — R079 Chest pain, unspecified: Secondary | ICD-10-CM

## 2014-12-16 DIAGNOSIS — N201 Calculus of ureter: Secondary | ICD-10-CM | POA: Insufficient documentation

## 2014-12-16 DIAGNOSIS — M109 Gout, unspecified: Secondary | ICD-10-CM | POA: Insufficient documentation

## 2014-12-16 DIAGNOSIS — R945 Abnormal results of liver function studies: Secondary | ICD-10-CM | POA: Diagnosis not present

## 2014-12-16 DIAGNOSIS — I10 Essential (primary) hypertension: Secondary | ICD-10-CM | POA: Insufficient documentation

## 2014-12-16 DIAGNOSIS — Z8619 Personal history of other infectious and parasitic diseases: Secondary | ICD-10-CM | POA: Diagnosis not present

## 2014-12-16 DIAGNOSIS — R1084 Generalized abdominal pain: Secondary | ICD-10-CM | POA: Diagnosis present

## 2014-12-16 DIAGNOSIS — R000 Tachycardia, unspecified: Secondary | ICD-10-CM | POA: Diagnosis not present

## 2014-12-16 DIAGNOSIS — F1210 Cannabis abuse, uncomplicated: Secondary | ICD-10-CM | POA: Insufficient documentation

## 2014-12-16 DIAGNOSIS — Z79899 Other long term (current) drug therapy: Secondary | ICD-10-CM | POA: Insufficient documentation

## 2014-12-16 DIAGNOSIS — I471 Supraventricular tachycardia: Secondary | ICD-10-CM | POA: Diagnosis not present

## 2014-12-16 DIAGNOSIS — F419 Anxiety disorder, unspecified: Secondary | ICD-10-CM | POA: Diagnosis not present

## 2014-12-16 DIAGNOSIS — K219 Gastro-esophageal reflux disease without esophagitis: Secondary | ICD-10-CM | POA: Insufficient documentation

## 2014-12-16 DIAGNOSIS — R7989 Other specified abnormal findings of blood chemistry: Secondary | ICD-10-CM | POA: Insufficient documentation

## 2014-12-16 DIAGNOSIS — F1110 Opioid abuse, uncomplicated: Secondary | ICD-10-CM | POA: Insufficient documentation

## 2014-12-16 DIAGNOSIS — F319 Bipolar disorder, unspecified: Secondary | ICD-10-CM | POA: Insufficient documentation

## 2014-12-16 LAB — COMPREHENSIVE METABOLIC PANEL
ALT: 142 U/L — ABNORMAL HIGH (ref 17–63)
AST: 92 U/L — ABNORMAL HIGH (ref 15–41)
Albumin: 4.5 g/dL (ref 3.5–5.0)
Alkaline Phosphatase: 65 U/L (ref 38–126)
Anion gap: 12 (ref 5–15)
BUN: 10 mg/dL (ref 6–20)
CO2: 16 mmol/L — ABNORMAL LOW (ref 22–32)
Calcium: 9.7 mg/dL (ref 8.9–10.3)
Chloride: 111 mmol/L (ref 101–111)
Creatinine, Ser: 0.97 mg/dL (ref 0.61–1.24)
GFR calc Af Amer: >60 mL/min (ref >60)
GFR calc non Af Amer: >60 mL/min (ref >60)
Glucose, Bld: 103 mg/dL — ABNORMAL HIGH (ref 65–99)
Potassium: 3.5 mmol/L (ref 3.5–5.1)
Sodium: 139 mmol/L (ref 135–145)
Total Bilirubin: 1 mg/dL (ref 0.3–1.2)
Total Protein: 7.8 g/dL (ref 6.5–8.1)

## 2014-12-16 LAB — I-STAT TROPONIN, ED: Troponin i, poc: 0 ng/mL (ref 0.00–0.08)

## 2014-12-16 LAB — URINE MICROSCOPIC-ADD ON

## 2014-12-16 LAB — URINALYSIS, ROUTINE W REFLEX MICROSCOPIC
Bilirubin Urine: NEGATIVE
Glucose, UA: NEGATIVE mg/dL
Ketones, ur: 15 mg/dL — AB
Leukocytes, UA: NEGATIVE
Nitrite: NEGATIVE
Protein, ur: NEGATIVE mg/dL
Specific Gravity, Urine: 1.025 (ref 1.005–1.030)
Urobilinogen, UA: 1 mg/dL (ref 0.0–1.0)
pH: 6 (ref 5.0–8.0)

## 2014-12-16 LAB — CBC
HCT: 46.1 % (ref 39.0–52.0)
Hemoglobin: 16.4 g/dL (ref 13.0–17.0)
MCH: 29.5 pg (ref 26.0–34.0)
MCHC: 35.6 g/dL (ref 30.0–36.0)
MCV: 82.9 fL (ref 78.0–100.0)
Platelets: 246 10*3/uL (ref 150–400)
RBC: 5.56 MIL/uL (ref 4.22–5.81)
RDW: 15.3 % (ref 11.5–15.5)
WBC: 9.9 10*3/uL (ref 4.0–10.5)

## 2014-12-16 LAB — TYPE AND SCREEN
ABO/RH(D): A POS
Antibody Screen: NEGATIVE

## 2014-12-16 LAB — LIPASE, BLOOD: Lipase: 25 U/L (ref 22–51)

## 2014-12-16 LAB — POC OCCULT BLOOD, ED: Fecal Occult Bld: POSITIVE — AB

## 2014-12-16 LAB — TROPONIN I: Troponin I: <0.03 ng/mL (ref <0.031)

## 2014-12-16 MED ORDER — SODIUM CHLORIDE 0.9 % IV BOLUS (SEPSIS)
1000.0000 mL | Freq: Once | INTRAVENOUS | Status: AC
  Administered 2014-12-16: 1000 mL via INTRAVENOUS

## 2014-12-16 MED ORDER — MORPHINE SULFATE (PF) 4 MG/ML IV SOLN
4.0000 mg | Freq: Once | INTRAVENOUS | Status: AC
  Administered 2014-12-16: 4 mg via INTRAVENOUS
  Filled 2014-12-16: qty 1

## 2014-12-16 MED ORDER — IOHEXOL 350 MG/ML SOLN
100.0000 mL | Freq: Once | INTRAVENOUS | Status: AC | PRN
  Administered 2014-12-16: 100 mL via INTRAVENOUS

## 2014-12-16 MED ORDER — MORPHINE SULFATE (PF) 4 MG/ML IV SOLN
4.0000 mg | Freq: Once | INTRAVENOUS | Status: AC
  Administered 2014-12-17: 4 mg via INTRAVENOUS
  Filled 2014-12-16: qty 1

## 2014-12-16 MED ORDER — ONDANSETRON HCL 4 MG/2ML IJ SOLN
4.0000 mg | Freq: Once | INTRAMUSCULAR | Status: AC
  Administered 2014-12-16: 4 mg via INTRAVENOUS
  Filled 2014-12-16: qty 2

## 2014-12-16 NOTE — ED Notes (Signed)
Pt sts generalized abd pain and chest pain x a few days. sts N,V,D. sts rectal bleeding. sts cough, fever.

## 2014-12-16 NOTE — ED Provider Notes (Signed)
CSN: [id number]     Arrival date & time 12/16/14  1645 History   First MD Initiated Contact with Patient 12/16/14 1940     Chief Complaint  Patient presents with  . Chest Pain  . Abdominal Pain     (Consider location/radiation/quality/duration/timing/severity/associated sxs/prior Treatment) HPI Comments: Pt comes in with c/o generalized abdominal pain and chest pain that started 3 days ago. He has also have fever, vomiting and diarrhea. He states that he has also had frank blood from his rectum. He sees the [name] for his care. He states that his only medical problem is htn. He has a history of cocaine abuse.  The history is provided by the patient. No language interpreter was used.    Past Medical History  Diagnosis Date  . Arthritis     per pt rheumatoid   . Hypertension   . Anxiety   . Bipolar affective disorder   . Depression   . Alcohol abuse   . Cocaine abuse     smokes crack-cocaine  . GERD (gastroesophageal reflux disease)   . Chronic back pain   . Tobacco abuse   . Hepatitis C   . Vitamin D deficiency   . Gout   . Hemorrhoids    History reviewed. No pertinent past surgical history. Family History  Problem Relation Age of Onset  . Hypertension Brother   . Diabetes Brother   . Hypertension Mother   . Diabetes Mother   . Arthritis Mother   . Other Sister     bone disease   Social History  Substance Use Topics  . Smoking status: Current Every Day Smoker -- 0.25 packs/day    Types: Cigarettes    Last Attempt to Quit: 11/23/2011  . Smokeless tobacco: None  . Alcohol Use: Yes     Comment: 24 oz--3x's wkly     Review of Systems  All other systems reviewed and are negative.     Allergies  Review of patient's allergies indicates no known allergies.  Home Medications   Prior to Admission medications   Medication Sig Start Date End Date Taking? Authorizing Provider  ARIPiprazole (ABILIFY) 10 MG tablet Take 1 tablet (10 mg total) by mouth daily. 04/06/14    Thermon Leyland, NP  gabapentin (NEURONTIN) 300 MG capsule Take 1 capsule (300 mg total) by mouth 3 (three) times daily. 04/06/14   Thermon Leyland, NP  hydrocortisone (ANUSOL-HC) 2.5 % rectal cream Place 1 application rectally 2 (two) times daily. 04/06/14   Thermon Leyland, NP  hydrocortisone (HEMORRHOIDAL-HC) 25 MG suppository Place 1 suppository (25 mg total) rectally 2 (two) times daily. 04/06/14   Thermon Leyland, NP  ibuprofen (ADVIL,MOTRIN) 800 MG tablet Take 800 mg by mouth every 8 (eight) hours as needed (for pain).    Historical Provider, MD  lisinopril (PRINIVIL,ZESTRIL) 30 MG tablet Take 1 tablet (30 mg total) by mouth daily. 04/06/14   Thermon Leyland, NP  lithium carbonate 150 MG capsule Take 3 capsules (450 mg total) by mouth 2 (two) times daily with a meal. 04/06/14   Thermon Leyland, NP  methocarbamol (ROBAXIN) 750 MG tablet Take 1 tablet (750 mg total) by mouth every 8 (eight) hours as needed for muscle spasms. 04/06/14   Thermon Leyland, NP  pantoprazole (PROTONIX) 40 MG tablet Take 1 tablet (40 mg total) by mouth daily. 04/06/14   Thermon Leyland, NP  traMADol (ULTRAM) 50 MG tablet Take 2 tablets (100 mg total) by mouth  every 6 (six) hours as needed for severe pain. 04/06/14   Laura A Davis, NP   BP 158/89 mmHg  Pulse 109  Temp(Src) 98.3 F (36.8 C) (Oral)  Resp 34  SpO2 100% Physical Exam  Constitutional: He appears well-developed and well-nourished.  HENT:  Head: Normocephalic and atraumatic.  Cardiovascular: Normal rate.   Pulmonary/Chest: Effort normal and breath sounds normal.  Abdominal: Soft. Bowel sounds are normal. There is tenderness.  Genitourinary:  Dark stool  Musculoskeletal: Normal range of motion.  Neurological: He is alert. Coordination normal.  Skin: Skin is warm. He is diaphoretic.  Psychiatric: He has a normal mood and affect.  Nursing note and vitals reviewed.   ED Course  Procedures (including critical care time) Labs Review Labs Reviewed  COMPREHENSIVE  METABOLIC PANEL - Abnormal; Notable for the following:    CO2 16 (*)    Glucose, Bld 103 (*)    AST 92 (*)    ALT 142 (*)    All other components within normal limits  URINALYSIS, ROUTINE W REFLEX MICROSCOPIC (NOT AT [facility]) - Abnormal; Notable for the following:    APPearance CLOUDY (*)    Hgb urine dipstick LARGE (*)    Ketones, ur 15 (*)    All other components within normal limits  URINE MICROSCOPIC-ADD ON - Abnormal; Notable for the following:    Casts HYALINE CASTS (*)    All other components within normal limits  POC OCCULT BLOOD, ED - Abnormal; Notable for the following:    Fecal Occult Bld POSITIVE (*)    All other components within normal limits  LIPASE, BLOOD  CBC  TROPONIN I  URINE RAPID DRUG SCREEN, HOSP PERFORMED  I-STAT TROPOININ, ED  TYPE AND SCREEN  ABO/RH    Imaging Review Dg Chest 2 View  12/16/2014   CLINICAL DATA:  Chest pain and abdomen pain for 3 days  EXAM: CHEST  2 VIEW  COMPARISON:  March 23, 2014  FINDINGS: The heart size and mediastinal contours are within normal limits. There is mild increased pulmonary interstitium bilaterally. There is no focal pneumonia or pleural effusion. The visualized skeletal structures are stable.  IMPRESSION: Mild increased pulmonary interstitium bilaterally with bilateral peribronchial cuffing. This can be seen in bronchitis.   Electronically Signed   By: Wei-Chen  Lin M.D.   On: 12/16/2014 17:54   Ct Angio Abdomen W/cm &/or Wo Contrast  12/16/2014   CLINICAL DATA:  54 year old male with left-sided chest pain radiating to the back.  EXAM: CT ANGIOGRAPHY CHEST AND ABDOMEN  TECHNIQUE: Multidetector CT imaging ofDoChaskae Nestn23d [id number] Kitchen956BjoMarland Kitche S   The lungs are clear. No pleural effusion or pneumothorax. The central airways are patent. The thoracic aorta is unremarkable. There is no aneurysm or dissection of the aorta. The pulmonary arteries appear unremarkable. Evaluation for pulmonary embolism is limited due to timing of the contrast and suboptimal opacification of the pulmonary arteries. There is no cardiomegaly or pericardial effusion. There is coronary vascular calcification. No hilar or mediastinal adenopathy. The thyroid gland appears unremarkable. The esophagus is collapsed.  There is no axillary adenopathy. The chest wall soft tissues appear unremarkable. The osseous structures are intact.  Review of the MIP images confirms the above findings.  CTA  ABDOMEN FINDINGS  No intra-abdominal free air or free fluid.  The liver, gallbladder, pancreas, spleen, adrenal glands, kidneys appear unremarkable. There is a 5 mm stone in the proximal right ureter. The visualized left ureter appear unremarkable.  There is no evidence of bowel obstruction or inflammation. The visualized appendix is unremarkable.  Minimal atherosclerotic calcification of the abdominal aorta. The origins of the celiac axis, SMA, IMA as well as the origins of the renal arteries are patent. All no portal venous gas identified. There is no lymphadenopathy.  The abdominal wall soft tissues appear unremarkable. There is degenerative changes of the lower lumbar spine. No acute fracture.  Review of the MIP images confirms the above findings.  IMPRESSION: No evidence of aortic dissection or aneurysm.  A 5 mm stone in the proximal right ureter.  No hydronephrosis.   Electronically Signed   By: Elgie Collard M.D.   On: 12/16/2014 21:42   Ct Angio Chest Aorta W/cm &/or Wo/cm  12/16/2014   CLINICAL DATA:  54 year old male with left-sided chest pain radiating to the back.  EXAM: CT ANGIOGRAPHY CHEST AND ABDOMEN  TECHNIQUE: Multidetector CT imaging of the chest and abdomen was performed using the  standard protocol during bolus administration of intravenous contrast. Multiplanar CT image reconstructions and MIPs were obtained to evaluate the vascular anatomy.  CONTRAST:  OMNIPAQUE IOHEXOL 350 MG/ML SOLN  COMPARISON:  CT dated 03/23/2014  FINDINGS: CTA CHEST FINDINGS  The lungs are clear. No pleural effusion or pneumothorax. The central airways are patent. The thoracic aorta is unremarkable. There is no aneurysm or dissection of the aorta. The pulmonary arteries appear unremarkable. Evaluation for pulmonary embolism is limited due to timing of the contrast and suboptimal opacification of the pulmonary arteries. There is no cardiomegaly or pericardial effusion. There is coronary vascular calcification. No hilar or mediastinal adenopathy. The thyroid gland appears unremarkable. The esophagus is collapsed.  There is no axillary adenopathy. The chest wall soft tissues appear unremarkable. The osseous structures are intact.  Review of the MIP images confirms the above findings.  CTA ABDOMEN FINDINGS  No intra-abdominal free air or free fluid.  The liver, gallbladder, pancreas, spleen, adrenal glands, kidneys appear unremarkable. There is a 5 mm stone in the proximal right ureter. The visualized left ureter appear unremarkable.  There is no evidence of bowel obstruction or inflammation. The visualized appendix is unremarkable.  Minimal atherosclerotic calcification of the abdominal aorta. The origins of the celiac axis, SMA, IMA as well as the origins of the renal arteries are patent. All no portal venous gas identified. There is no lymphadenopathy.  The abdominal wall soft tissues appear unremarkable. There is degenerative changes of the lower lumbar spine. No acute fracture.  Review of the MIP images confirms the above findings.  IMPRESSION: No evidence of aortic dissection or aneurysm.  A 5 mm stone in the proximal right ureter.  No hydronephrosis.   Electronically Signed   By: Elgie Collard M.D.   On:  12/16/2014 21:42   I have personally reviewed and evaluated these images and lab results as part of my medical decision-making.   EKG Interpretation   Date/Time:  Wednesday December 16 2014 19:55:57 EDT Ventricular Rate:  104 PR Interval:  170 QRS Duration: 105 QT Interval:  362 QTC Calculation: 476 R Axis:   25 Text Interpretation:  Sinus tachycardia Atrial premature complex RSR' in  V1 or V2, right VCD or RVH Nonspecific T abnormalities, lateral leads  Borderline prolonged QT interval Confirmed  by [facility] MD, Barbara Cower [phone]) on  12/16/2014 11:02:29 PM      MDM   Final diagnoses:  Right ureteral stone  SVT (supraventricular tachycardia)  Elevated LFTs    Pt brought back to the room and got diaphoretic and was in svt and c/o chest pain. Heart rate slowed after a couple of minutes and the diaphoresis resolved. Pt was having cp during this episode 11:52 PM Pt is no longer having cp. States that he is having a little bit of abdominal pain again. Think that svt was likely caused by pain. No infection noted in urine. Will send home with oxycodone, zofran and flomax     Teressa Lower, NP 12/17/14 0003  Marily Memos, MD 12/17/14 [id number]

## 2014-12-16 NOTE — ED Notes (Signed)
Pt diaphoretic, c/o Cp, pt placed on cardiac monitor, pt in SVT, HR 170, MD notified, repeat EKG

## 2014-12-17 DIAGNOSIS — N201 Calculus of ureter: Secondary | ICD-10-CM | POA: Diagnosis not present

## 2014-12-17 LAB — RAPID URINE DRUG SCREEN, HOSP PERFORMED
Amphetamines: NOT DETECTED
Barbiturates: NOT DETECTED
Benzodiazepines: NOT DETECTED
Cocaine: NOT DETECTED
Opiates: POSITIVE — AB
Tetrahydrocannabinol: POSITIVE — AB

## 2014-12-17 LAB — ABO/RH: ABO/RH(D): A POS

## 2014-12-17 MED ORDER — TAMSULOSIN HCL 0.4 MG PO CAPS: 0.4000 mg | ORAL_CAPSULE | Freq: Every day | ORAL | Status: DC

## 2014-12-17 MED ORDER — ONDANSETRON 4 MG PO TBDP: 4.0000 mg | ORAL_TABLET | Freq: Three times a day (TID) | ORAL | Status: DC | PRN

## 2014-12-17 MED ORDER — OXYCODONE-ACETAMINOPHEN 5-325 MG PO TABS: 1.0000 | ORAL_TABLET | ORAL | Status: DC | PRN

## 2014-12-17 NOTE — Discharge Instructions (Signed)
Kidney Stones °Kidney stones (urolithiasis) are deposits that form inside your kidneys. The intense pain is caused by the stone moving through the urinary tract. When the stone moves, the ureter goes into spasm around the stone. The stone is usually passed in the urine.  °CAUSES  °· A disorder that makes certain neck glands produce too much parathyroid hormone (primary hyperparathyroidism). °· A buildup of uric acid crystals, similar to gout in your joints. °· Narrowing (stricture) of the ureter. °· A kidney obstruction present at birth (congenital obstruction). °· Previous surgery on the kidney or ureters. °· Numerous kidney infections. °SYMPTOMS  °· Feeling sick to your stomach (nauseous). °· Throwing up (vomiting). °· Blood in the urine (hematuria). °· Pain that usually spreads (radiates) to the groin. °· Frequency or urgency of urination. °DIAGNOSIS  °· Taking a history and physical exam. °· Blood or urine tests. °· CT scan. °· Occasionally, an examination of the inside of the urinary bladder (cystoscopy) is performed. °TREATMENT  °· Observation. °· Increasing your fluid intake. °· Extracorporeal shock wave lithotripsy--This is a noninvasive procedure that uses shock waves to break up kidney stones. °· Surgery may be needed if you have severe pain or persistent obstruction. There are various surgical procedures. Most of the procedures are performed with the use of small instruments. Only small incisions are needed to accommodate these instruments, so recovery time is minimized. °The size, location, and chemical composition are all important variables that will determine the proper choice of action for you. Talk to your health care provider to better understand your situation so that you will minimize the risk of injury to yourself and your kidney.  °HOME CARE INSTRUCTIONS  °· Drink enough water and fluids to keep your urine clear or pale yellow. This will help you to pass the stone or stone fragments. °· Strain  all urine through the provided strainer. Keep all particulate matter and stones for your health care provider to see. The stone causing the pain may be as small as a grain of salt. It is very important to use the strainer each and every time you pass your urine. The collection of your stone will allow your health care provider to analyze it and verify that a stone has actually passed. The stone analysis will often identify what you can do to reduce the incidence of recurrences. °· Only take over-the-counter or prescription medicines for pain, discomfort, or fever as directed by your health care provider. °· Make a follow-up appointment with your health care provider as directed. °· Get follow-up X-rays if required. The absence of pain does not always mean that the stone has passed. It may have only stopped moving. If the urine remains completely obstructed, it can cause loss of kidney function or even complete destruction of the kidney. It is your responsibility to make sure X-rays and follow-ups are completed. Ultrasounds of the kidney can show blockages and the status of the kidney. Ultrasounds are not associated with any radiation and can be performed easily in a matter of minutes. °SEEK MEDICAL CARE IF: °· You experience pain that is progressive and unresponsive to any pain medicine you have been prescribed. °SEEK IMMEDIATE MEDICAL CARE IF:  °· Pain cannot be controlled with the prescribed medicine. °· You have a fever or shaking chills. °· The severity or intensity of pain increases over 18 hours and is not relieved by pain medicine. °· You develop a new onset of abdominal pain. °· You feel faint or pass out. °·   You are unable to urinate. MAKE SURE YOU:   Understand these instructions.  Will watch your condition.  Will get help right away if you are not doing well or get worse. Document Released: 03/27/2005 Document Revised: 11/27/2012 Document Reviewed: 08/28/2012 [facility] Patient Information 2015  [place], [place]. This information is not intended to replace advice given to you by your health care provider. Make sure you discuss any questions you have with your health care provider.  Supraventricular Tachycardia Supraventricular tachycardia (SVT) is an abnormal heart rhythm (arrhythmia) that causes the heart to beat very fast (tachycardia). This kind of fast heartbeat originates in the upper chambers of the heart (atria). SVT can cause the heart to beat greater than 100 beats per minute. SVT can have a rapid burst of heartbeats. This can start and stop suddenly without warning and is called nonsustained. SVT can also be sustained, in which the heart beats at a continuous fast rate.  CAUSES  There can be different causes of SVT. Some of these include:  Heart valve problems such as mitral valve prolapse.  An enlarged heart (hypertrophic cardiomyopathy).  Congenital heart problems.  Heart inflammation (pericarditis).  Hyperthyroidism.  Low potassium or magnesium levels.  Caffeine.  Drug use such as cocaine, methamphetamines, or stimulants.  Some over-the-counter medicines such as:  Decongestants.  Diet medicines.  Herbal medicines. SYMPTOMS  Symptoms of SVT can vary. Symptoms depend on whether the SVT is sustained or nonsustained. You may experience:  No symptoms (asymptomatic).  An awareness of your heart beating rapidly (palpitations).  Shortness of breath.  Chest pain or pressure. If your blood pressure drops because of the SVT, you may experience:  Fainting or near fainting.  Weakness.  Dizziness. DIAGNOSIS  Different tests can be performed to diagnose SVT, such as:  An electrocardiogram (EKG). This is a painless test that records the electrical activity of your heart.  Holter monitor. This is a 24 hour recording of your heart rhythm. You will be given a diary. Write down all symptoms that you have and what you were doing at the time you experienced  symptoms.  Arrhythmia monitor. This is a small device that your wear for several weeks. It records the heart rhythm when you have symptoms.  Echocardiogram. This is an imaging test to help detect abnormal heart structure such as congenital abnormalities, heart valve problems, or heart enlargement.  Stress test. This test can help determine if the SVT is related to exercise.  Electrophysiology study (EPS). This is a procedure that evaluates your heart's electrical system and can help your caregiver find the cause of your SVT. TREATMENT  Treatment of SVT depends on the symptoms, how often it recurs, and whether there are any underlying heart problems.   If symptoms are rare and no other cardiac disease is present, no treatment may be needed.  Blood work may be done to check potassium, magnesium, and thyroid hormone levels to see if they are abnormal. If these levels are abnormal, treatment to correct the problems will occur. Medicines Your caregiver may use oral medicines to treat SVT. These medicines are given for long-term control of SVT. Medicines may be used alone or in combination with other treatments. These medicines work to slow nerve impulses in the heart muscle. These medicines can also be used to treat high blood pressure. Some of these medicines may include:  Calcium channel blockers.  Beta blockers.  Digoxin. Nonsurgical procedures Nonsurgical techniques may be used if oral medicines do not work. Some examples  include:  Cardioversion. This technique uses either drugs or an electrical shock to restore a normal heart rhythm.  Cardioversion drugs may be given through an intravenous (IV) line to help "reset" the heart rhythm.  In electrical cardioversion, the caregiver shocks your heart to stop its beat for a split second. This helps to reset the heart to a normal rhythm.  Ablation. This procedure is done under mild sedation. High frequency radio wave energy is used to destroy  the area of heart tissue responsible for the SVT. HOME CARE INSTRUCTIONS   Do not smoke.  Only take medicines prescribed by your caregiver. Check with your caregiver before using over-the-counter medicines.  Check with your caregiver about how much alcohol and caffeine (coffee, tea, colas, or chocolate) you may have.  It is very important to keep all follow-up referrals and appointments in order to properly manage this problem. SEEK IMMEDIATE MEDICAL CARE IF:  You have dizziness.  You faint or nearly faint.  You have shortness of breath.  You have chest pain or pressure.  You have sudden nausea or vomiting.  You have profuse sweating.  You are concerned about how long your symptoms last.  You are concerned about the frequency of your SVT episodes. If you have the above symptoms, call your local emergency services (911 in [place] immediately. Do not drive yourself to the hospital. MAKE SURE YOU:   Understand these instructions.  Will watch your condition.  Will get help right away if you are not doing well or get worse. Document Released: 03/27/2005 Document Revised: 06/19/2011 Document Reviewed: 07/09/2008 [facility] Patient Information 2015 [place], [place]. This information is not intended to replace advice given to you by your health care provider. Make sure you discuss any questions you have with your health care provider.

## 2015-01-27 ENCOUNTER — Emergency Department (HOSPITAL_COMMUNITY): Payer: Medicare Other

## 2015-01-27 ENCOUNTER — Emergency Department (HOSPITAL_COMMUNITY)
Admission: EM | Admit: 2015-01-27 | Discharge: 2015-01-27 | Disposition: A | Discharge status: Home / Self Care | Payer: Medicare Other | Attending: Emergency Medicine | Admitting: Emergency Medicine

## 2015-01-27 ENCOUNTER — Encounter (HOSPITAL_COMMUNITY): Payer: Self-pay | Admitting: Emergency Medicine

## 2015-01-27 ENCOUNTER — Encounter (HOSPITAL_COMMUNITY): Payer: Self-pay

## 2015-01-27 ENCOUNTER — Emergency Department (EMERGENCY_DEPARTMENT_HOSPITAL)
Admission: EM | Admit: 2015-01-27 | Discharge: 2015-01-28 | Disposition: A | Discharge status: Other Acute Inpatient Hospital | Payer: Medicare Other | Source: Home / Self Care | Attending: Emergency Medicine | Admitting: Emergency Medicine

## 2015-01-27 DIAGNOSIS — R45851 Suicidal ideations: Secondary | ICD-10-CM | POA: Insufficient documentation

## 2015-01-27 DIAGNOSIS — F3130 Bipolar disorder, current episode depressed, mild or moderate severity, unspecified: Secondary | ICD-10-CM | POA: Diagnosis not present

## 2015-01-27 DIAGNOSIS — Z79899 Other long term (current) drug therapy: Secondary | ICD-10-CM | POA: Insufficient documentation

## 2015-01-27 DIAGNOSIS — R404 Transient alteration of awareness: Secondary | ICD-10-CM

## 2015-01-27 DIAGNOSIS — R7401 Elevation of levels of liver transaminase levels: Secondary | ICD-10-CM

## 2015-01-27 DIAGNOSIS — F1920 Other psychoactive substance dependence, uncomplicated: Secondary | ICD-10-CM

## 2015-01-27 DIAGNOSIS — W19XXXA Unspecified fall, initial encounter: Secondary | ICD-10-CM

## 2015-01-27 DIAGNOSIS — R220 Localized swelling, mass and lump, head: Secondary | ICD-10-CM | POA: Diagnosis not present

## 2015-01-27 DIAGNOSIS — R569 Unspecified convulsions: Secondary | ICD-10-CM | POA: Diagnosis not present

## 2015-01-27 DIAGNOSIS — K219 Gastro-esophageal reflux disease without esophagitis: Secondary | ICD-10-CM | POA: Insufficient documentation

## 2015-01-27 DIAGNOSIS — F329 Major depressive disorder, single episode, unspecified: Secondary | ICD-10-CM | POA: Insufficient documentation

## 2015-01-27 DIAGNOSIS — S0083XA Contusion of other part of head, initial encounter: Secondary | ICD-10-CM | POA: Diagnosis not present

## 2015-01-27 DIAGNOSIS — R4182 Altered mental status, unspecified: Secondary | ICD-10-CM | POA: Diagnosis not present

## 2015-01-27 DIAGNOSIS — R740 Nonspecific elevation of levels of transaminase and lactic acid dehydrogenase [LDH]: Secondary | ICD-10-CM

## 2015-01-27 LAB — COMPREHENSIVE METABOLIC PANEL
ALT: 706 U/L — ABNORMAL HIGH (ref 17–63)
AST: 359 U/L — ABNORMAL HIGH (ref 15–41)
Albumin: 4.2 g/dL (ref 3.5–5.0)
Alkaline Phosphatase: 52 U/L (ref 38–126)
Anion gap: 13 (ref 5–15)
BUN: 11 mg/dL (ref 6–20)
CO2: 28 mmol/L (ref 22–32)
Calcium: 10.2 mg/dL (ref 8.9–10.3)
Chloride: 98 mmol/L — ABNORMAL LOW (ref 101–111)
Creatinine, Ser: 1.22 mg/dL (ref 0.61–1.24)
GFR calc Af Amer: >60 mL/min (ref >60)
GFR calc non Af Amer: >60 mL/min (ref >60)
Glucose, Bld: 111 mg/dL — ABNORMAL HIGH (ref 65–99)
Potassium: 2.8 mmol/L — ABNORMAL LOW (ref 3.5–5.1)
Sodium: 139 mmol/L (ref 135–145)
Total Bilirubin: 0.9 mg/dL (ref 0.3–1.2)
Total Protein: 7.1 g/dL (ref 6.5–8.1)

## 2015-01-27 LAB — CBC WITH DIFFERENTIAL/PLATELET
Basophils Absolute: 0 10*3/uL (ref 0.0–0.1)
Basophils Relative: 0 %
Eosinophils Absolute: 0.1 10*3/uL (ref 0.0–0.7)
Eosinophils Relative: 2 %
HCT: 37.9 % — ABNORMAL LOW (ref 39.0–52.0)
Hemoglobin: 13.4 g/dL (ref 13.0–17.0)
Lymphocytes Relative: 19 %
Lymphs Abs: 1.5 10*3/uL (ref 0.7–4.0)
MCH: 29.6 pg (ref 26.0–34.0)
MCHC: 35.4 g/dL (ref 30.0–36.0)
MCV: 83.7 fL (ref 78.0–100.0)
Monocytes Absolute: 0.9 10*3/uL (ref 0.1–1.0)
Monocytes Relative: 11 %
Neutro Abs: 5.5 10*3/uL (ref 1.7–7.7)
Neutrophils Relative %: 68 %
Platelets: 241 10*3/uL (ref 150–400)
RBC: 4.53 MIL/uL (ref 4.22–5.81)
RDW: 14.3 % (ref 11.5–15.5)
WBC: 8.1 10*3/uL (ref 4.0–10.5)

## 2015-01-27 LAB — RAPID URINE DRUG SCREEN, HOSP PERFORMED
Amphetamines: NOT DETECTED
Barbiturates: NOT DETECTED
Benzodiazepines: NOT DETECTED
Cocaine: POSITIVE — AB
Opiates: NOT DETECTED
Tetrahydrocannabinol: POSITIVE — AB

## 2015-01-27 LAB — SALICYLATE LEVEL: Salicylate Lvl: <4 mg/dL (ref 2.8–30.0)

## 2015-01-27 LAB — TROPONIN I: Troponin I: <0.03 ng/mL (ref <0.031)

## 2015-01-27 LAB — ACETAMINOPHEN LEVEL: Acetaminophen (Tylenol), Serum: <10 ug/mL — ABNORMAL LOW (ref 10–30)

## 2015-01-27 LAB — ETHANOL: Alcohol, Ethyl (B): <5 mg/dL (ref <5)

## 2015-01-27 MED ORDER — HYDROXYZINE HCL 25 MG PO TABS: 25.0000 mg | ORAL_TABLET | Freq: Three times a day (TID) | ORAL | Status: DC | PRN

## 2015-01-27 MED ORDER — ONDANSETRON 4 MG PO TBDP: 4.0000 mg | ORAL_TABLET | Freq: Three times a day (TID) | ORAL | Status: DC | PRN

## 2015-01-27 MED ORDER — TRAZODONE HCL 100 MG PO TABS: 100.0000 mg | ORAL_TABLET | Freq: Every evening | ORAL | Status: DC | PRN

## 2015-01-27 MED ORDER — POTASSIUM CHLORIDE CRYS ER 20 MEQ PO TBCR
40.0000 meq | EXTENDED_RELEASE_TABLET | Freq: Once | ORAL | Status: AC
  Administered 2015-01-27: 40 meq via ORAL
  Filled 2015-01-27: qty 2

## 2015-01-27 MED ORDER — GABAPENTIN 300 MG PO CAPS
300.0000 mg | ORAL_CAPSULE | Freq: Three times a day (TID) | ORAL | Status: DC
  Administered 2015-01-27 – 2015-01-28 (×3): 300 mg via ORAL
  Filled 2015-01-27 (×3): qty 1

## 2015-01-27 MED ORDER — ONDANSETRON HCL 4 MG/2ML IJ SOLN: 4.0000 mg | Freq: Once | INTRAMUSCULAR | Status: DC

## 2015-01-27 MED ORDER — PANTOPRAZOLE SODIUM 40 MG PO TBEC
40.0000 mg | DELAYED_RELEASE_TABLET | Freq: Every day | ORAL | Status: DC
  Administered 2015-01-27 – 2015-01-28 (×2): 40 mg via ORAL
  Filled 2015-01-27 (×2): qty 1

## 2015-01-27 MED ORDER — SODIUM CHLORIDE 0.9 % IV BOLUS (SEPSIS): 1000.0000 mL | Freq: Once | INTRAVENOUS | Status: DC

## 2015-01-27 MED ORDER — ARIPIPRAZOLE 10 MG PO TABS
10.0000 mg | ORAL_TABLET | Freq: Every day | ORAL | Status: DC
  Administered 2015-01-27 – 2015-01-28 (×2): 10 mg via ORAL
  Filled 2015-01-27 (×2): qty 1

## 2015-01-27 MED ORDER — LITHIUM CARBONATE 300 MG PO CAPS
450.0000 mg | ORAL_CAPSULE | Freq: Two times a day (BID) | ORAL | Status: DC
  Administered 2015-01-27 – 2015-01-28 (×2): 450 mg via ORAL
  Filled 2015-01-27 (×4): qty 1

## 2015-01-27 MED ORDER — MECLIZINE HCL 25 MG PO TABS: 25.0000 mg | ORAL_TABLET | Freq: Once | ORAL | Status: DC

## 2015-01-27 MED ORDER — TAMSULOSIN HCL 0.4 MG PO CAPS
0.4000 mg | ORAL_CAPSULE | Freq: Every day | ORAL | Status: DC
  Administered 2015-01-27 – 2015-01-28 (×2): 0.4 mg via ORAL
  Filled 2015-01-27 (×2): qty 1

## 2015-01-27 MED ORDER — METHOCARBAMOL 500 MG PO TABS
750.0000 mg | ORAL_TABLET | Freq: Three times a day (TID) | ORAL | Status: DC | PRN
  Administered 2015-01-28: 750 mg via ORAL
  Filled 2015-01-27: qty 2

## 2015-01-27 MED ORDER — POTASSIUM CHLORIDE 10 MEQ/100ML IV SOLN
10.0000 meq | Freq: Once | INTRAVENOUS | Status: AC
Start: 2015-01-27 — End: 2015-01-27
  Administered 2015-01-27: 10 meq via INTRAVENOUS
  Filled 2015-01-27: qty 100

## 2015-01-27 NOTE — Progress Notes (Signed)
Patient under review at [facility].  Melbourne Abts, LCSWA Disposition staff 01/27/2015 6:19 PM

## 2015-01-27 NOTE — ED Notes (Signed)
Patient changed into scrubs. MD at bedside at this time.

## 2015-01-27 NOTE — ED Notes (Signed)
Patient seen at [facility] last night. Patient states he is now suicidal. Patient states he is bipolar, states he is taking medications for his bipolar. Patient states he did not tell staff at [facility] that he was suicidal. Patient states he told them he wanted to go to the [name] or come to [facility]. Patient states he was then kicked out of [facility]. Patient with bruising to left eye. Patient questioned if he has a plan for self harm. Patient states "all kinds of stuff", when asked to expand patient states "take pills or i don't know". Patient rambling that he does not know what happened last night or why he was roaming around. Patient again stating he wants to go to the [name]. Patient was asked what he needs mental health help for, he is unable to articulate. Patient repeatedly complaining about the triage chair being uncomfortable.

## 2015-01-27 NOTE — ED Notes (Addendum)
Pt AAO x 3, agitated about being transferred from TCU to psych ed, pt reports he is SI all the time.  Pt stating he does not want to talk at this time.  Refusing to answer questions.  Monitoring for safety, Q 15 min checks in effect.  No distress noted. Pt reports he wants to be treated at [facility].

## 2015-01-27 NOTE — BH Assessment (Addendum)
Assessment Note  [name] is an 54 y.o. male who presents to [facility] voluntarily stating that he called the [name] crisis hotline stating that he was suicidal and he uses drugs. Patient states that he uses "pills, crack/cocaine and some weed" and declined to answer further questions. Patient states that "I use some drugs but I'm here because I'm suicidal." When asked questions about drug use patient states "that's not important, I got suicidal ideations and I want to get some help." Patient refused to answer further questions in the assessment and remained in the bed with his head under the blanket.   Patient was able to identify his name and date of birth and the city and state. Patient states that he does not have a guardian and was asked the assessment questions several times. Patient appeared to be sleeping and would wake up and say "I'm trying to answer you ma'am" and then would sit silently under the blanket.   Counselor spoke with EDP Dr. Adela Lank who states that the [facility] stating that he was having a seizure and was discharged and called 911 stating that he was suicidal and would like to go to the [name] for treatment. Patient informed EDP that he was suicidal with a plan to jump in front of a car. Patient was not responsive when asked questions about what he told the EDP. Patient BAL <5 and UDS +cocaine and THC.   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 Arthritis     per pt rheumatoid   . Hypertension   . Anxiety   . Bipolar affective disorder (HCC)   . Depression   . Alcohol abuse   . Cocaine abuse     smokes crack-cocaine  . GERD (gastroesophageal reflux disease)   . Chronic back pain   . Tobacco abuse   . Hepatitis C   . Vitamin D deficiency   . Gout   . Hemorrhoids     History reviewed. No pertinent past surgical history.  Family History:   Family History  Problem Relation Age of Onset  . Hypertension Brother   . Diabetes Brother   . Hypertension Mother   . Diabetes Mother   . Arthritis Mother   . Other Sister     bone disease    Social History:  reports that he has been smoking Cigarettes.  He has been smoking about 0.25 packs per day. He does not have any smokeless tobacco history on file. He reports that he drinks alcohol. He reports that he uses illicit drugs (Marijuana and Cocaine).  Additional Social History:  Alcohol / Drug Use Pain Medications: See PTA Prescriptions: See PTA Over the Counter: See PTA History of alcohol / drug use?: Yes Substance #1 Name of Substance 1: Crack 1 - Age of First Use: 45 Substance #2 Name of Substance 2: Pills Substance #3 Name of Substance 3: THC  CIWA: CIWA-Ar BP: 135/88 mmHg Pulse Rate: 67 COWS:    Allergies: No Known Allergies  Home Medications:  (Not in a hospital admission)  OB/GYN Status:  No LMP for male patient.  General Assessment Data Location of Assessment: [facility] T[facility] Assessment: In system Is this a Tele or Face-to-Face Assessment?: Face-to-Face Is this an Initial Assessment or a Re-assessment for this encounter?: Initial Assessment Marital status: Single Living Arrangements: Alone Can pt return to current living arrangement?:  Yes Admission Status: Voluntary     Crisis Care Plan Living Arrangements: Alone Name of Psychiatrist: Dr. Hyacinth Me[facility] ([facility]) Name of Therapist: Smitty Cords [facility] [place])  Education Status Is patient currently in school?: No Highest grade of school patient has completed: College  Risk to self with the past 6 months Suicidal Ideation: Yes-Currently Present Is patient at risk for suicide?: Yes Substance abuse history and/or treatment for substance abuse?: Yes              ADLScreening [facility] Assessment Services) Patient's cognitive ability adequate to safely complete daily activities?: Yes Patient able to express need for assistance  with ADLs?: Yes Independently performs ADLs?: Yes (appropriate for developmental age)        ADL Screening (condition at time of admission) Patient's cognitive ability adequate to safely complete daily activities?: Yes Is the patient deaf or have difficulty hearing?: No Does the patient have difficulty seeing, even when wearing glasses/contacts?: No Does the patient have difficulty concentrating, remembering, or making decisions?: No Patient able to express need for assistance with ADLs?: Yes Does the patient have difficulty dressing or bathing?: No Independently performs ADLs?: Yes (appropriate for developmental age)       Abuse/Neglect Assessment (Assessment to be complete while patient is alone) Physical Abuse: Denies Verbal Abuse: Denies Sexual Abuse: Denies Exploitation of patient/patient's resources: Denies Self-Neglect: Denies Values / Beliefs Cultural Requests During Hospitalization: None Spiritual Requests During Hospitalization: None Consults Spiritual Care Consult Needed: No Social Work Consult Needed: No Merchant [employer] officer (For Healthcare) Does patient have an advance directive?: No Would patient like information on creating an advanced directive?: No - patient declined information          Disposition:  Disposition Initial Assessment Completed for this Encounter: Yes  On Site Evaluation by:   Reviewed with Physician:    Adit Riddles 01/27/2015 8:46 AM
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
## 2015-01-27 NOTE — Consult Note (Signed)
[facility] Face-to-Face Psychiatry Consult   Reason for Consult:  Suicidal ideation, instability Referring Physician:  EDP Patient Identification: [name] MRN:  [id number] Principal Diagnosis: Bipolar I disorder, most recent episode depressed (HCC) Diagnosis:   Patient Active Problem List   Diagnosis Date Noted  . Bipolar I disorder, most recent episode depressed (HCC) [F31.30]     Priority: High  . Polysubstance dependence (HCC) [F19.20] 03/21/2012    Priority: High  . Alcohol use disorder, severe, dependence (HCC) [F10.20]   . Cocaine use disorder, severe, dependence (HCC) [F14.20]   . Generalized abdominal pain [R10.84]   . Bipolar disorder, current episode depressed, severe, without psychotic features (HCC) [F31.4]   . Bipolar affective disorder, depressed, severe (HCC) [F31.4] 03/26/2014  . Right hip pain [M25.551]   . Medically noncompliant [Z91.19] 03/23/2014  . Dysuria [R30.0] 03/22/2013  . Headache [R51] 03/22/2013  . Anxiety [F41.9]   . Bipolar affective disorder (HCC) [F31.9]   . Polysubstance abuse [F19.10]   . Gout [M10.9]   . Vitamin D deficiency [E55.9]   . Hepatitis C [B19.20]     Total Time spent with patient: 35 minutes  Subjective:   [name] is a 54 y.o. male patient admitted with reports of suicidal ideation [facility], also complaining of polysubstance dependence. Pt evaluated by NP/MD team. Pt is alert/oriented x4, anxious, depressed, yet cooperative. Pt continues to present as suicidal and in need of treatment for polysubstance dependence. Denies homicidal ideation and psychosis and does not appear to be responding to internal stimuli.   HPI:  I have reviewed and concur with HPI below, modified as follows:  [name] is an 54 y.o. male who presents to [facility] voluntarily stating that he called the [name] crisis hotline stating that he was suicidal and he uses drugs. Patient states that he uses "pills, crack/cocaine and  some weed" and declined to answer further questions. Patient states that "I use some drugs but I'm here because I'm suicidal." When asked questions about drug use patient states "that's not important, I got suicidal ideations and I want to get some help." Patient refused to answer further questions in the assessment and remained in the bed with his head under the blanket.   Patient was able to identify his name and date of birth and the city and state. Patient states that he does not have a guardian and was asked the assessment questions several times. Patient appeared to be sleeping and would wake up and say "I'm trying to answer you ma'am" and then would sit silently under the blanket.   Counselor spoke with EDP Dr. Adela Lank who states that the patient went [facility] stating that he was having a seizure and was discharged and called 911 stating that he was suicidal and would like to go to the [name] for treatment. Patient informed EDP that he was suicidal with a plan to jump in front of a car. Patient was not responsive when asked questions about what he told the EDP. Patient BAL <5 and UDS +cocaine and THC.     Risk to Self: Suicidal Ideation: Yes-Currently Present Suicidal Intent:  (UTA) Is patient at risk for suicide?:  (UTA) Suicidal Plan?:  (UTA) Access to Means:  (UTA) What has been your use of drugs/alcohol within the last 12 months?: ocaine, THC, Pills How many times?:  (UTA) Other Self Harm Risks:  (UTA) Triggers for Past Attempts:  (  UTA) Intentional Self Injurious Behavior:  (UTA) Risk to Others: Homicidal Ideation:  (UTA) Thoughts of Harm to Others:  (UTA) Current Homicidal Intent:  (UTA) Current Homicidal Plan:  (UTA) Access to Homicidal Means:  (UTA) Identified Victim:  (UTA) History of harm to others?:  (UTA) Assessment of Violence:  (UTA) Violent Behavior Description:  (UTA) Does patient have access to weapons?:  (UTA) Criminal Charges Pending?:   (UTA) Does patient have a court date:  (UTA) Prior Inpatient Therapy:   Prior Outpatient Therapy:    Past Medical History:  Past Medical History  Diagnosis Date  . Arthritis     per pt rheumatoid   . Hypertension   . Anxiety   . Bipolar affective disorder (HCC)   . Depression   . Alcohol abuse   . Cocaine abuse     smokes crack-cocaine  . GERD (gastroesophageal reflux disease)   . Chronic back pain   . Tobacco abuse   . Hepatitis C   . Vitamin D deficiency   . Gout   . Hemorrhoids    History reviewed. No pertinent past surgical history. Family History:  Family History  Problem Relation Age of Onset  . Hypertension Brother   . Diabetes Brother   . Hypertension Mother   . Diabetes Mother   . Arthritis Mother   . Other Sister     bone disease   Social History:  History  Alcohol Use  . Yes    Comment: 24 oz--3x's wkly      History  Drug Use  . Yes  . Special: Marijuana, Cocaine    Comment: THC   cocaine,     Social History   Social History  . Marital Status: Single    Spouse Name: N/A  . Number of Children: N/A  . Years of Education: N/A   Social History Main Topics  . Smoking status: Current Every Day Smoker -- 0.25 packs/day    Types: Cigarettes  . Smokeless tobacco: None  . Alcohol Use: Yes     Comment: 24 oz--3x's wkly   . Drug Use: Yes    Special: Marijuana, Cocaine     Comment: THC   cocaine,   . Sexual Activity: Not Currently   Other Topics Concern  . None   Social History Narrative   Lives alone in [place], [place]   Additional Social History:    Pain Medications: See PTA Prescriptions: See PTA Over the Counter: See PTA History of alcohol / drug use?: Yes Name of Substance 1: Crack 1 - Age of First Use: 35 Name of Substance 2: Pills Name of Substance 3: THC               Allergies:  No Known Allergies  Labs:  Results for orders placed or performed during the hospital encounter of 01/27/15 (from the past 48 hour(s))   Salicylate level     Status: None   Collection Time: 01/27/15  8:15 AM  Result Value Ref Range   Salicylate Lvl <4.0 2.8 - 30.0 mg/dL  Acetaminophen level     Status: Abnormal   Collection Time: 01/27/15  8:15 AM  Result Value Ref Range   Acetaminophen (Tylenol), Serum <10 (L) 10 - 30 ug/mL    Comment:        THERAPEUTIC CONCENTRATIONS VARY SIGNIFICANTLY. A RANGE OF 10-30 ug/mL MAY BE AN EFFECTIVE CONCENTRATION FOR MANY PATIENTS. HOWEVER, SOME ARE BEST TREATED AT CONCENTRATIONS OUTSIDE THIS RANGE. ACETAMINOPHEN CONCENTRATIONS >150 ug/mL AT 4  HOURS AFTER INGESTION AND >50 ug/mL AT 12 HOURS AFTER INGESTION ARE OFTEN ASSOCIATED WITH TOXIC REACTIONS.   Urine rapid drug screen (hosp performed) (Not at [facility])     Status: Abnormal   Collection Time: 01/27/15 12:00 PM  Result Value Ref Range   Opiates NONE DETECTED NONE DETECTED   Cocaine POSITIVE (A) NONE DETECTED   Benzodiazepines NONE DETECTED NONE DETECTED   Amphetamines NONE DETECTED NONE DETECTED   Tetrahydrocannabinol POSITIVE (A) NONE DETECTED   Barbiturates NONE DETECTED NONE DETECTED    Comment:        DRUG SCREEN FOR MEDICAL PURPOSES ONLY.  IF CONFIRMATION IS NEEDED FOR ANY PURPOSE, NOTIFY LAB WITHIN 5 DAYS.        LOWEST DETECTABLE LIMITS FOR URINE DRUG SCREEN Drug Class       Cutoff (ng/mL) Amphetamine      1000 Barbiturate      200 Benzodiazepine   200 Tricyclics       300 Opiates          300 Cocaine          300 THC              50     Current Facility-Administered Medications  Medication Dose Route Frequency Provider Last Rate Last Dose  . ARIPiprazole (ABILIFY) tablet 10 mg  10 mg Oral Daily Sid Greener   10 mg at 01/27/15 1253  . gabapentin (NEURONTIN) capsule 300 mg  300 mg Oral TID Renika Shiflet   Stopped at 01/27/15 1617  . hydrOXYzine (ATARAX/VISTARIL) tablet 25 mg  25 mg Oral TID PRN Aundra Espin      . lithium carbonate capsule 450 mg  450 mg Oral BID WC Jaycub Noorani   450 mg at  01/27/15 1704  . methocarbamol (ROBAXIN) tablet 750 mg  750 mg Oral Q8H PRN Judene Logue      . ondansetron (ZOFRAN-ODT) disintegrating tablet 4 mg  4 mg Oral Q8H PRN Aeon Koors      . pantoprazole (PROTONIX) EC tablet 40 mg  40 mg Oral Daily Malak Orantes   40 mg at 01/27/15 1253  . tamsulosin (FLOMAX) capsule 0.4 mg  0.4 mg Oral Daily Alex Mcmanigal   0.4 mg at 01/27/15 1252  . traZODone (DESYREL) tablet 100 mg  100 mg Oral QHS PRN Wilkins Elpers       Current Outpatient Prescriptions  Medication Sig Dispense Refill  . ARIPiprazole (ABILIFY) 10 MG tablet Take 1 tablet (10 mg total) by mouth daily. 30 tablet 0  . gabapentin (NEURONTIN) 300 MG capsule Take 1 capsule (300 mg total) by mouth 3 (three) times daily. 90 capsule 0  . hydrocortisone (ANUSOL-HC) 2.5 % rectal cream Place 1 application rectally 2 (two) times daily. (Patient taking differently: Place 1 application rectally 2 (two) times daily as needed for hemorrhoids. ) 30 g 0  . hydrocortisone (HEMORRHOIDAL-HC) 25 MG suppository Place 1 suppository (25 mg total) rectally 2 (two) times daily. (Patient taking differently: Place 25 mg rectally 2 (two) times daily as needed for hemorrhoids. ) 12 suppository 0  . ibuprofen (ADVIL,MOTRIN) 800 MG tablet Take 800 mg by mouth every 8 (eight) hours as needed (for pain).    Marland Kitchen lisinopril (PRINIVIL,ZESTRIL) 30 MG tablet Take 1 tablet (30 mg total) by mouth daily. 30 tablet 0  . lithium carbonate 150 MG capsule Take 3 capsules (450 mg total) by mouth 2 (two) times daily with a meal. 180 capsule 0  . methocarbamol (ROBAXIN) 750  MG tablet Take 1 tablet (750 mg total) by mouth every 8 (eight) hours as needed for muscle spasms. 10 tablet 0  . ondansetron (ZOFRAN ODT) 4 MG disintegrating tablet Take 1 tablet (4 mg total) by mouth every 8 (eight) hours as needed for nausea or vomiting. 20 tablet 0  . oxyCODONE-acetaminophen (PERCOCET/ROXICET) 5-325 MG per tablet Take 1-2 tablets by mouth every 4  (four) hours as needed for severe pain. 15 tablet 0  . pantoprazole (PROTONIX) 40 MG tablet Take 1 tablet (40 mg total) by mouth daily.    . tamsulosin (FLOMAX) 0.4 MG CAPS capsule Take 1 capsule (0.4 mg total) by mouth daily. 30 capsule 0  . traMADol (ULTRAM) 50 MG tablet Take 2 tablets (100 mg total) by mouth every 6 (six) hours as needed for severe pain. 15 tablet 0    Musculoskeletal: Strength & Muscle Tone: within normal limits Gait & Station: normal Patient leans: N/A  Psychiatric Specialty Exam: Review of Systems  Psychiatric/Behavioral: Positive for depression, suicidal ideas and substance abuse. Negative for hallucinations. The patient is nervous/anxious and has insomnia.   All other systems reviewed and are negative.   Blood pressure 135/88, pulse 67, temperature 97.8 F (36.6 C), temperature source Oral, resp. rate 20, SpO2 100 %.There is no weight on file to calculate BMI.  General Appearance: Casual and Fairly Groomed  Eye Contact::  Good  Speech:  Clear and Coherent and Normal Rate  Volume:  Decreased  Mood:  Anxious and Depressed  Affect:  Appropriate, Congruent and Depressed  Thought Process:  Circumstantial and Coherent  Orientation:  Full (Time, Place, and Person)  Thought Content:  WDL and Rumination  Suicidal Thoughts:  Yes.  with intent/plan  Homicidal Thoughts:  No  Memory:  Immediate;   Fair Recent;   Fair Remote;   Fair  Judgement:  Fair  Insight:  Fair  Psychomotor Activity:  Normal  Concentration:  Fair  Recall:  [employer] of Knowledge:Fair  Language: Fair  Akathisia:  No  Handed:    AIMS (if indicated):     Assets:  Communication Skills Desire for Improvement Resilience Social Support  ADL's:  Intact  Cognition: WNL  Sleep:      Treatment Plan Summary: Daily contact with patient to assess and evaluate symptoms and progress in treatment and Medication management  Disposition: Recommend psychiatric Inpatient admission when medically  cleared.  Beau Fanny, FNP-BC 01/27/2015 5:52 PM Patient seen face-to-face for psychiatric evaluation, chart reviewed and case discussed with the physician extender and developed treatment plan. Reviewed the information documented and agree with the treatment plan. Thedore Mins, MD

## 2015-01-27 NOTE — ED Notes (Signed)
Pt given cup of water and tolerating well, refusing to get up and ambulate, and refusing to give urine sample still.

## 2015-01-27 NOTE — Discharge Instructions (Signed)
You were seen today after a fall and loss of consciousness. Your workup is reassuring. You do not appear to have sustained a significant traumatic injury.  There was some report of seizure activity; however this cannot be corroborated . However, until you are cleared by neurology, you should not drive or operate heavy machinery.  Facial or Scalp Contusion A facial or scalp contusion is a deep bruise on the face or head. Injuries to the face and head generally cause a lot of swelling, especially around the eyes. Contusions are the result of an injury that caused bleeding under the skin. The contusion may turn blue, purple, or yellow. Minor injuries will give you a painless contusion, but more severe contusions may stay painful and swollen for a few weeks.  CAUSES  A facial or scalp contusion is caused by a blunt injury or trauma to the face or head area.  SIGNS AND SYMPTOMS   Swelling of the injured area.   Discoloration of the injured area.   Tenderness, soreness, or pain in the injured area.  DIAGNOSIS  The diagnosis can be made by taking a medical history and doing a physical exam. An X-ray exam, CT scan, or MRI may be needed to determine if there are any associated injuries, such as broken bones (fractures). TREATMENT  Often, the best treatment for a facial or scalp contusion is applying cold compresses to the injured area. Over-the-counter medicines may also be recommended for pain control.  HOME CARE INSTRUCTIONS   Only take over-the-counter or prescription medicines as directed by your health care provider.   Apply ice to the injured area.   Put ice in a plastic bag.   Place a towel between your skin and the bag.   Leave the ice on for 20 minutes, 2-3 times a day.  SEEK MEDICAL CARE IF:  You have bite problems.   You have pain with chewing.   You are concerned about facial defects. SEEK IMMEDIATE MEDICAL CARE IF:  You have severe pain or a headache that is not  relieved by medicine.   You have unusual sleepiness, confusion, or personality changes.   You throw up (vomit).   You have a persistent nosebleed.   You have double vision or blurred vision.   You have fluid drainage from your nose or ear.   You have difficulty walking or using your arms or legs.  MAKE SURE YOU:   Understand these instructions.  Will watch your condition.  Will get help right away if you are not doing well or get worse.   This information is not intended to replace advice given to you by your health care provider. Make sure you discuss any questions you have with your health care provider.   Document Released: 05/04/2004 Document Revised: 04/17/2014 Document Reviewed: 11/07/2012 Elsevier Interactive Patient Education 2016 [employer]. Confusion Confusion is the inability to think with your usual speed or clarity. Confusion may come on quickly or slowly over time. How quickly the confusion comes on depends on the cause. Confusion can be due to any number of causes. CAUSES  Concussion, head injury, or head trauma. Seizures. Stroke. Fever. Brain tumor. Age related decreased brain function (dementia). Heightened emotional states like rage or terror. Mental illness in which the person loses the ability to determine what is real and what is not (hallucinations). Infections such as a urinary tract infection (UTI). Toxic effects from alcohol, drugs, or prescription medicines. Dehydration and an imbalance of salts in the body (  electrolytes). Lack of sleep. Low blood sugar (diabetes). Low levels of oxygen from conditions such as chronic lung disorders. Drug interactions or other medicine side effects. Nutritional deficiencies, especially niacin, thiamine, vitamin C, or vitamin B. Sudden drop in body temperature (hypothermia). Change in routine, such as when traveling or hospitalized. SIGNS AND SYMPTOMS  People often describe their thinking as cloudy or  unclear when they are confused. Confusion can also include feeling disoriented. That means you are unaware of where or who you are. You may also not know what the date or time is. If confused, you may also have difficulty paying attention, remembering, and making decisions. Some people also act aggressively when they are confused.  DIAGNOSIS  The medical evaluation of confusion may include: Blood and urine tests. X-rays. Brain and nervous system tests. Analyzing your brain waves (electroencephalogram or EEG). Magnetic resonance imaging (MRI) of your head. Computed tomography (CT) scan of your head. Mental status tests in which your health care provider may ask many questions. Some of these questions may seem silly or strange, but they are a very important test to help diagnose and treat confusion. TREATMENT  An admission to the hospital may not be needed, but a person with confusion should not be left alone. Stay with a family member or friend until the confusion clears. Avoid alcohol, pain relievers, or sedative drugs until you have fully recovered. Do not drive until directed by your health care provider. HOME CARE INSTRUCTIONS  What family and friends can do: To find out if someone is confused, ask the person to state his or her name, age, and the date. If the person is unsure or answers incorrectly, he or she is confused. Always introduce yourself, no matter how well the person knows you. Often remind the person of his or her location. Place a calendar and clock near the confused person. Help the person with his or her medicines. You may want to use a pill box, an alarm as a reminder, or give the person each dose as prescribed. Talk about current events and plans for the day. Try to keep the environment calm, quiet, and peaceful. Make sure the person keeps follow-up visits with his or her health care provider. PREVENTION  Ways to prevent confusion: Avoid alcohol. Eat a balanced  diet. Get enough sleep. Take medicine only as directed by your health care provider. Do not become isolated. Spend time with other people and make plans for your days. Keep careful watch on your blood sugar levels if you are diabetic. SEEK IMMEDIATE MEDICAL CARE IF:  You develop severe headaches, repeated vomiting, seizures, blackouts, or slurred speech. There is increasing confusion, weakness, numbness, restlessness, or personality changes. You develop a loss of balance, have marked dizziness, feel uncoordinated, or fall. You have delusions, hallucinations, or develop severe anxiety. Your family members think you need to be rechecked.   This information is not intended to replace advice given to you by your health care provider. Make sure you discuss any questions you have with your health care provider.   Document Released: 05/04/2004 Document Revised: 04/17/2014 Document Reviewed: 05/02/2013 Elsevier Interactive Patient Education [employer]

## 2015-01-27 NOTE — Progress Notes (Signed)
Patient accepted at [facility], to Dr. Dub Mikes, to 300-1, arrival time - after 8am. [facility] RN to be informed.  Melbourne Abts, LCSWA Disposition staff 01/27/2015 10:54 PM

## 2015-01-27 NOTE — ED Notes (Signed)
Pt states he is unable to void at this time.
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
## 2015-01-27 NOTE — [facility] Counselor (Signed)
Inpatient recommended on [address] hall at this time per Dr. Jannifer Franklin and Claudette Head, DNP.   Davina Poke, LCSW Therapeutic Triage Specialist [facility] 01/27/2015 2:08 PM
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
## 2015-01-27 NOTE — ED Notes (Signed)
Pt refused X-ray

## 2015-01-27 NOTE — ED Notes (Signed)
Pt refused to sign e-signature  

## 2015-01-27 NOTE — ED Notes (Signed)
Pt got up, states he was dizzy and staggering around room, after refusing to get up.

## 2015-01-28 ENCOUNTER — Encounter (HOSPITAL_COMMUNITY): Payer: Self-pay

## 2015-01-28 ENCOUNTER — Inpatient Hospital Stay (HOSPITAL_COMMUNITY)
Admission: EM | Admit: 2015-01-28 | Discharge: 2015-02-01 | DRG: 885 | Disposition: A | Discharge status: Home / Self Care | Payer: Medicare Other | Source: Intra-hospital | Attending: Psychiatry | Admitting: Psychiatry

## 2015-01-28 ENCOUNTER — Encounter (HOSPITAL_COMMUNITY): Payer: Self-pay | Admitting: Registered Nurse

## 2015-01-28 DIAGNOSIS — F329 Major depressive disorder, single episode, unspecified: Secondary | ICD-10-CM | POA: Diagnosis not present

## 2015-01-28 DIAGNOSIS — Z79899 Other long term (current) drug therapy: Secondary | ICD-10-CM | POA: Diagnosis not present

## 2015-01-28 DIAGNOSIS — F319 Bipolar disorder, unspecified: Secondary | ICD-10-CM | POA: Diagnosis present

## 2015-01-28 DIAGNOSIS — F3130 Bipolar disorder, current episode depressed, mild or moderate severity, unspecified: Secondary | ICD-10-CM | POA: Diagnosis not present

## 2015-01-28 DIAGNOSIS — F314 Bipolar disorder, current episode depressed, severe, without psychotic features: Secondary | ICD-10-CM | POA: Diagnosis not present

## 2015-01-28 DIAGNOSIS — F17210 Nicotine dependence, cigarettes, uncomplicated: Secondary | ICD-10-CM | POA: Diagnosis present

## 2015-01-28 DIAGNOSIS — I10 Essential (primary) hypertension: Secondary | ICD-10-CM | POA: Diagnosis not present

## 2015-01-28 DIAGNOSIS — F1020 Alcohol dependence, uncomplicated: Secondary | ICD-10-CM | POA: Diagnosis not present

## 2015-01-28 DIAGNOSIS — K219 Gastro-esophageal reflux disease without esophagitis: Secondary | ICD-10-CM | POA: Diagnosis not present

## 2015-01-28 DIAGNOSIS — F1420 Cocaine dependence, uncomplicated: Secondary | ICD-10-CM | POA: Diagnosis present

## 2015-01-28 DIAGNOSIS — R45851 Suicidal ideations: Secondary | ICD-10-CM | POA: Diagnosis not present

## 2015-01-28 MED ORDER — TAMSULOSIN HCL 0.4 MG PO CAPS
0.4000 mg | ORAL_CAPSULE | Freq: Every day | ORAL | Status: DC
  Administered 2015-01-29 – 2015-02-01 (×4): 0.4 mg via ORAL
  Filled 2015-01-28 (×7): qty 1

## 2015-01-28 MED ORDER — ACETAMINOPHEN 325 MG PO TABS
650.0000 mg | ORAL_TABLET | Freq: Four times a day (QID) | ORAL | Status: DC | PRN
  Administered 2015-01-29: 650 mg via ORAL
  Filled 2015-01-28 (×2): qty 2

## 2015-01-28 MED ORDER — TRAZODONE HCL 100 MG PO TABS: 100.0000 mg | ORAL_TABLET | Freq: Every evening | ORAL | Status: DC | PRN

## 2015-01-28 MED ORDER — GABAPENTIN 300 MG PO CAPS
300.0000 mg | ORAL_CAPSULE | Freq: Three times a day (TID) | ORAL | Status: DC
  Administered 2015-01-28 – 2015-02-01 (×12): 300 mg via ORAL
  Filled 2015-01-28 (×18): qty 1

## 2015-01-28 MED ORDER — LITHIUM CARBONATE 150 MG PO CAPS
450.0000 mg | ORAL_CAPSULE | Freq: Two times a day (BID) | ORAL | Status: DC
  Administered 2015-01-28 – 2015-02-01 (×8): 450 mg via ORAL
  Filled 2015-01-28 (×12): qty 3

## 2015-01-28 MED ORDER — PANTOPRAZOLE SODIUM 40 MG PO TBEC
40.0000 mg | DELAYED_RELEASE_TABLET | Freq: Every day | ORAL | Status: DC
  Administered 2015-01-29 – 2015-02-01 (×4): 40 mg via ORAL
  Filled 2015-01-28 (×7): qty 1

## 2015-01-28 MED ORDER — ONDANSETRON 4 MG PO TBDP
4.0000 mg | ORAL_TABLET | Freq: Three times a day (TID) | ORAL | Status: DC | PRN
Start: 2015-01-28 — End: 2015-02-01

## 2015-01-28 MED ORDER — HYDROXYZINE HCL 25 MG PO TABS
25.0000 mg | ORAL_TABLET | Freq: Three times a day (TID) | ORAL | Status: DC | PRN
  Administered 2015-01-29 – 2015-01-31 (×3): 25 mg via ORAL
  Filled 2015-01-28 (×3): qty 1

## 2015-01-28 MED ORDER — ARIPIPRAZOLE 10 MG PO TABS
10.0000 mg | ORAL_TABLET | Freq: Every day | ORAL | Status: DC
  Administered 2015-01-29 – 2015-02-01 (×4): 10 mg via ORAL
  Filled 2015-01-28 (×7): qty 1

## 2015-01-28 MED ORDER — ALUM & MAG HYDROXIDE-SIMETH 200-200-20 MG/5ML PO SUSP: 30.0000 mL | ORAL | Status: DC | PRN

## 2015-01-28 MED ORDER — MAGNESIUM HYDROXIDE 400 MG/5ML PO SUSP: 30.0000 mL | Freq: Every day | ORAL | Status: DC | PRN

## 2015-01-28 MED ORDER — METHOCARBAMOL 750 MG PO TABS
750.0000 mg | ORAL_TABLET | Freq: Three times a day (TID) | ORAL | Status: DC | PRN
  Administered 2015-01-28 – 2015-01-30 (×3): 750 mg via ORAL
  Filled 2015-01-28 (×3): qty 1

## 2015-01-28 MED ORDER — TRAZODONE HCL 50 MG PO TABS
ORAL_TABLET | ORAL | Status: AC
  Filled 2015-01-28: qty 1

## 2015-01-28 NOTE — Progress Notes (Signed)
Pt did not attend karaoke this evening.  

## 2015-01-28 NOTE — [facility] Group Notes (Signed)
[facility] LCSW Group Therapy  01/28/2015 2:36 PM  Type of Therapy:  Group Therapy  Participation Level:  Invited, did not attend.  Modes of Intervention:  Discussion, Exploration, Reality Testing and Support   Finding Balance in Life. Today's group focused on defining balance in one's own words, identifying things that can knock one off balance, and exploring healthy ways to maintain balance in life. Group members were asked to provide an example of a time when they felt off balance, describe how they handled that situation, and process healthier ways to regain balance in the future. Group members were asked to share the most important tool for maintaining balance that they learned while at [facility] and how they plan to apply this method after discharge. Finding Balance in Life. Today's group focused on defining balance in one's own words, identifying things that can knock one off balance, and exploring healthy ways to maintain balance in life. Group members were asked to provide an example of a time when they felt off balance, describe how they handled that situation, and process healthier ways to regain balance in the future. Group members were asked to share the most important tool for maintaining balance that they learned while at [facility] and how they plan to apply this method after discharge.   Summary of Progress/Problems:  Did not attend.    [name] 01/28/2015, 2:36 PM    

## 2015-01-28 NOTE — ED Notes (Signed)
Pt ambulatory w/o difficulty to [facility] w/ [place], belongings given to driver. 

## 2015-01-28 NOTE — ED Notes (Signed)
Pt screaming out that he wants to speak with a doctor, not a lady nurse.  Resting at present.
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
## 2015-01-28 NOTE — ED Notes (Signed)
Do not transport until 1200p per Tanna Savoy

## 2015-01-28 NOTE — Tx Team (Addendum)
Initial Interdisciplinary Treatment Plan   PATIENT STRESSORS: Health problems Substance abuse   PATIENT STRENGTHS: Average or above average intelligence Capable of independent living   PROBLEM LIST: Problem List/Patient Goals Date to be addressed Date deferred Reason deferred Estimated date of resolution  Suicidal ideation 01/28/2015     Homicidal ideation 01/28/2015     Substance abuse 01/28/2015                                          DISCHARGE CRITERIA:  Improved stabilization in mood, thinking, and/or behavior Need for constant or close observation no longer present  PRELIMIN[facility]  PATIENT/FAMIILY INVOLVEMENT: This treatment plan has been presented to and reviewed with the patient, [name], and/or family member.  The patient and family have been given the opportunity to ask questions and make suggestions.  [name] 01/28/2015, 3:29 PM

## 2015-01-28 NOTE — Progress Notes (Signed)
Patient ID: SADLER TESCHNER, male   DOB: 23-Oct-1960, 54 y.o.   MRN: [id number] PER STATE REGULATIONS 482.30  THIS CHART WAS REVIEWED FOR MEDICAL NECESSITY WITH RESPECT TO THE PATIENT'S ADMISSION/DURATION OF STAY.  NEXT REVIEW DATE: 02/01/15  Loura Halt, RN, BSN CASE MANAGER

## 2015-01-29 ENCOUNTER — Encounter (HOSPITAL_COMMUNITY): Payer: Self-pay | Admitting: Psychiatry

## 2015-01-29 DIAGNOSIS — F1420 Cocaine dependence, uncomplicated: Secondary | ICD-10-CM

## 2015-01-29 DIAGNOSIS — F1020 Alcohol dependence, uncomplicated: Secondary | ICD-10-CM

## 2015-01-29 DIAGNOSIS — F319 Bipolar disorder, unspecified: Secondary | ICD-10-CM | POA: Insufficient documentation

## 2015-01-29 MED ORDER — POTASSIUM CHLORIDE CRYS ER 20 MEQ PO TBCR
EXTENDED_RELEASE_TABLET | ORAL | Status: AC
  Filled 2015-01-29: qty 2

## 2015-01-29 MED ORDER — POTASSIUM CHLORIDE CRYS ER 20 MEQ PO TBCR
20.0000 meq | EXTENDED_RELEASE_TABLET | Freq: Two times a day (BID) | ORAL | Status: AC
  Administered 2015-01-29 – 2015-01-30 (×2): 20 meq via ORAL
  Filled 2015-01-29 (×2): qty 1

## 2015-01-29 MED ORDER — IBUPROFEN 600 MG PO TABS
600.0000 mg | ORAL_TABLET | Freq: Four times a day (QID) | ORAL | Status: DC | PRN
  Administered 2015-01-30 – 2015-01-31 (×3): 600 mg via ORAL
  Filled 2015-01-29 (×3): qty 1

## 2015-01-29 MED ORDER — POTASSIUM CHLORIDE CRYS ER 20 MEQ PO TBCR
40.0000 meq | EXTENDED_RELEASE_TABLET | Freq: Once | ORAL | Status: AC
  Administered 2015-01-29: 40 meq via ORAL

## 2015-01-29 NOTE — Progress Notes (Signed)
Pt attended AA meeting and was appropriate throughout the meeting.  

## 2015-01-29 NOTE — Plan of Care (Signed)
Problem: Alteration in mood & ability to function due to Goal: STG-Patient will attend groups Outcome: Not Progressing Pt did not attend evening group on 01/28/15.

## 2015-01-29 NOTE — Tx Team (Signed)
Interdisciplinary Treatment Plan Update (Adult)  Date:  01/29/2015  Time Reviewed:  8:22 AM   Progress in Treatment: Attending groups: No. Participating in groups:  No. Taking medication as prescribed:  Yes. Tolerating medication:  Yes. Family/Significant othe contact made:   Patient understands diagnosis:  Yes. Seeking treatment for SI, depression, and substance abuse. Pt minimizes substance use and states that he is here for depression/SI mainly.  Discussing patient identified problems/goals with staff:  Yes. Medical problems stabilized or resolved:  Yes. Denies suicidal/homicidal ideation: Yes. Issues/concerns per patient self-inventory:  Other:  Discharge Plan or Barriers: CSW assessing for appropriate referrals. Pt did not attend morning d/c planning group.   Reason for Continuation of Hospitalization: Depression Medication stabilization Suicidal ideation  Comments:  [name] is an 54 y.o. male who presents to [facility] voluntarily stating that he called the [place] crisis hotline stating that he was suicidal and he uses drugs. Patient states that he uses "pills, crack/cocaine and some weed" and declined to answer further questions. Patient states that "I use some drugs but I'm here because I'm suicidal." When asked questions about drug use patient states "that's not important, I got suicidal ideations and I want to get some help." Patient refused to answer further questions in the assessment and remained in the bed with his head under the blanket. Patient was able to identify his name and date of birth and the city and state. Patient states that he does not have a guardian and was asked the assessment questions several times. Patient appeared to be sleeping and would wake up and say "I'm trying to answer you ma'am" and then would sit silently under the blanket. Counselor spoke with EDP Dr. Tyrone Nine who states that the [facility] stating that he  was having a seizure and was discharged and called 911 stating that he was suicidal and would like to go to the [place] for treatment. Patient informed EDP that he was suicidal with a plan to jump in front of a car. Patient was not responsive when asked questions about what he told the EDP. Patient BAL <5 and UDS +cocaine and THC. 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 Per Dr. Sabra Heck, tentative d/c scheduled for Sunday.   New goal(s): to develop effective aftercare plan.   Additional Comments:  Patient and CSW reviewed pt's identified goals and treatment plan. Patient verbalized understanding and agreed to treatment plan. CSW reviewed [facility] "Discharge Process and Patient Involvement" Form. Pt verbalized understanding of information provided and signed form.    Review of initial/current patient goals per problem list:  1. Goal(s): Patient will participate in aftercare plan  Met: No.   Target date: at discharge  As evidenced by: Patient will participate within aftercare plan AEB aftercare provider and housing plan at discharge being identified.  10/21: CSW assessing for appropriate referrals. Pt did not attend morning d/c planning group.   2. Goal (s): Patient will exhibit decreased depressive symptoms and suicidal ideations.  Met: No.    Target date: at discharge  As evidenced by: Patient will utilize self rating of depression at 3 or below and demonstrate decreased signs of depression or be deemed stable for discharge by MD.  10/21: Pt rates depression as high. No SI/HI/AVH reported.   3. Goal(s): Patient will demonstrate decreased signs of withdrawal due to substance abuse  Met:Yes  Target date:at discharge   As evidenced by: Patient will produce a CIWA/COWS score of 0, have stable vitals signs, and no symptoms of withdrawal.  10/21: Pt reports no signs of  withdrawal with COW/CIWA not taken and stable vitals.    Attendees: Patient:   01/29/2015 8:22 AM   Family:   01/29/2015 8:22 AM   Physician:  Dr. Carlton Adam, MD 01/29/2015 8:22 AM   Nursing:   Sonny Dandy RN 01/29/2015 8:22 AM   Clinical Social Worker: Maxie Better, [place]  01/29/2015 8:22 AM   Clinical Social Worker: Erasmo Downer Drinkard LCSWA; Peri Maris LCSWA 01/29/2015 8:22 AM   Other:  Gerline Legacy Nurse Case Manager 01/29/2015 8:22 AM   Other:  Lucinda Dell; [facility]  01/29/2015 8:22 AM   Other:   01/29/2015 8:22 AM   Other:  01/29/2015 8:22 AM   Other:  01/29/2015 8:22 AM   Other:  01/29/2015 8:22 AM    01/29/2015 8:22 AM    01/29/2015 8:22 AM    01/29/2015 8:22 AM    01/29/2015 8:22 AM    Scribe for Treatment Team:   Maxie Better, LCSWA  01/29/2015 8:22 AM

## 2015-01-29 NOTE — Progress Notes (Signed)
D: [name] has remained in bed most of the day. Asked about SI/HI, he said, "I told Dr. Dub Mikes." He remains grumpy and irritable but somewhat polite. He is able to verbalize needs with staff and has been encouraged to do so. He has generalized pain but has refused Tylenol. A: Meds given as ordered. Q15 safety checks maintained. Fluids encouraged. Support/encouragement offered. R: Pt remains free from harm and continues with treatment. Will continue to monitor for needs/safety.

## 2015-01-29 NOTE — [facility] Counselor (Signed)
CSW made 2 attempts to meet with pt today. Both times, pt lying in bed sleeping. Not willing to get up to complete PSA. Per Dr. Dub Mikes, pt is dealing with medical issues at this time. Pt signed 72 hour request for discharge on 01/28/15 at 1855.   Trula Slade, LCSWA Clinical Social Worker 01/29/2015 2:49 PM

## 2015-01-29 NOTE — Progress Notes (Signed)
Recreation Therapy Notes  Date: 10.21.2016 Time: 9:30am Location: [address] Group Room   Group Topic: Stress Management  Goal Area(s) Addresses:  Patient will actively participate in stress management techniques presented during session.   Behavioral Response: Did not attend.     Marykay Lex Larya Charpentier, LRT/CTRS  Tyjuan Demetro L 01/29/2015 1:29 PM
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
## 2015-01-29 NOTE — B[facility] Group Notes (Signed)
[facility] LCSW Group Therapy  01/29/2015 1:07 PM  Type of Therapy:  Group Therapy  Participation Level:  None-DID NOT ATTEND. Pt invited. Chose to remain in bed.   Modes of Intervention:  Confrontation, Discussion, Education, Exploration, Problem-solving, Rapport Building, Socialization and Support  Summary of Progress/Problems: Feelings around Relapse. Group members discussed the meaning of relapse and shared personal stories of relapse, how it affected them and others, and how they perceived themselves during this time. Group members were encouraged to identify triggers, warning signs and coping skills used when facing the possibility of relapse. Social supports were discussed and explored in detail. Post Acute Withdrawal Syndrome (handout provided) was introduced and examined. Pt's were encouraged to ask questions, talk about key points associated with PAWS, and process this information in terms of relapse prevention.   Smart, Charmion Hapke LCSWA 01/29/2015, 1:07 PM

## 2015-01-29 NOTE — Progress Notes (Signed)
D: Pt is in his room in bed upon approach.  He has depressed, irritable affect and mood.  He forwards little information to Clinical research associate and he is irritable when speaking with Clinical research associate.  Pt reports his goal tonight is to get some rest.  He reports SI and HI.  When asked if he had a plan, he reports he does.  When asked to disclose his plan, pt refuses.  He verbally contracts for safety.  Pt denies hallucinations, reports generalized pain of 9/10.  Pt complains of muscle spasms.  He did not attend evening group and he interacted with his peers minimally.  He stayed in his room for the majority of the evening.   A: PRN medication for pain offered, pt refused.  PRN medication administered for muscle spasms.  Encouragement and support offered.  PO fluids encouraged and provided.   R: Pt verbally contracts for safety.  He reports that he will notify staff of needs and concerns.  Will continue to monitor and assess.

## 2015-01-29 NOTE — [facility] Group Notes (Signed)
[facility] LCSW Aftercare Discharge Planning Group Note   01/29/2015 10:31 AM  Participation Quality:  Invited. DID NOT ATTEND. Chose to remain in group.   Smart, [employer]

## 2015-01-29 NOTE — BHH Suicide Risk Assessment (Signed)
[facility] Admission Suicide Risk Assessment   Nursing information obtained from:    Demographic factors:    Current Mental Status:    Loss Factors:    Historical Factors:    Risk Reduction Factors:    Total Time spent with patient: 45 minutes Principal Problem: Bipolar disorder, current episode depressed, severe, without psychotic features (HCC) Diagnosis:   Patient Active Problem List   Diagnosis Date Noted  . Alcohol use disorder, severe, dependence (HCC) [F10.20]     Priority: High  . Cocaine use disorder, severe, dependence (HCC) [F14.20]     Priority: High  . Bipolar disorder, current episode depressed, severe, without psychotic features (HCC) [F31.4]     Priority: High  . Polysubstance dependence (HCC) [F19.20] 03/21/2012    Priority: High  . Bipolar affective disorder (HCC) [F31.9]     Priority: High  . Generalized abdominal pain [R10.84]   . Bipolar I disorder, most recent episode depressed (HCC) [F31.30]   . Bipolar affective disorder, depressed, severe (HCC) [F31.4] 03/26/2014  . Right hip pain [M25.551]   . Medically noncompliant [Z91.19] 03/23/2014  . Dysuria [R30.0] 03/22/2013  . Headache [R51] 03/22/2013  . Anxiety [F41.9]   . Polysubstance abuse [F19.10]   . Gout [M10.9]   . Vitamin D deficiency [E55.9]   . Hepatitis C [B19.20]      Continued Clinical Symptoms:    The "Alcohol Use Disorders Identification Test", Guidelines for Use in Primary Care, Second Edition.  World Science writer [facility]). Score between 0-7:  no or low risk or alcohol related problems. Score between 8-15:  moderate risk of alcohol related problems. Score between 16-19:  high risk of alcohol related problems. Score 20 or above:  warrants further diagnostic evaluation for alcohol dependence and treatment.   CLINICAL FACTORS:   Bipolar Disorder:   Depressive phase Alcohol/Substance Abuse/Dependencies  Psychiatric Specialty Exam: Physical Exam  ROS  Blood pressure 138/65, pulse 86,  temperature 97.3 F (36.3 C), temperature source Oral, resp. rate 16, height 5\' 7"  (1.702 m), weight 117.935 kg (260 lb).Body mass index is 40.71 kg/(m^2).    COGNITIVE FEATURES THAT CONTRIBUTE TO RISK:  Closed-mindedness, Polarized thinking and Thought constriction (tunnel vision)    SUICIDE RISK:   Moderate:  Frequent suicidal ideation with limited intensity, and duration, some specificity in terms of plans, no associated intent, good self-control, limited dysphoria/symptomatology, some risk factors present, and identifiable protective factors, including available and accessible social support.  PLAN OF CARE: see admission H and P  Medical Decision Making:  Review of Psycho-Social Stressors (1), Review or order clinical lab tests (1), Review of Medication Regimen & Side Effects (2) and Review of New Medication or Change in Dosage (2)  I certify that inpatient services furnished can reasonably be expected to improve the patient's condition.   Elzia Hott A 01/29/2015, 2:00 PM

## 2015-01-30 DIAGNOSIS — R45851 Suicidal ideations: Secondary | ICD-10-CM

## 2015-01-30 DIAGNOSIS — F314 Bipolar disorder, current episode depressed, severe, without psychotic features: Principal | ICD-10-CM

## 2015-01-30 LAB — COMPREHENSIVE METABOLIC PANEL
ALT: 368 U/L — ABNORMAL HIGH (ref 17–63)
AST: 134 U/L — ABNORMAL HIGH (ref 15–41)
Albumin: 3.9 g/dL (ref 3.5–5.0)
Alkaline Phosphatase: 55 U/L (ref 38–126)
Anion gap: 7 (ref 5–15)
BUN: 5 mg/dL — ABNORMAL LOW (ref 6–20)
CO2: 27 mmol/L (ref 22–32)
Calcium: 10.1 mg/dL (ref 8.9–10.3)
Chloride: 108 mmol/L (ref 101–111)
Creatinine, Ser: 0.9 mg/dL (ref 0.61–1.24)
GFR calc Af Amer: >60 mL/min (ref >60)
GFR calc non Af Amer: >60 mL/min (ref >60)
Glucose, Bld: 112 mg/dL — ABNORMAL HIGH (ref 65–99)
Potassium: 3.1 mmol/L — ABNORMAL LOW (ref 3.5–5.1)
Sodium: 142 mmol/L (ref 135–145)
Total Bilirubin: 0.6 mg/dL (ref 0.3–1.2)
Total Protein: 7.2 g/dL (ref 6.5–8.1)

## 2015-01-30 MED ORDER — POTASSIUM CHLORIDE CRYS ER 20 MEQ PO TBCR
40.0000 meq | EXTENDED_RELEASE_TABLET | Freq: Two times a day (BID) | ORAL | Status: AC
Start: 2015-01-30 — End: 2015-01-31
  Administered 2015-01-30 – 2015-01-31 (×2): 40 meq via ORAL
  Filled 2015-01-30 (×3): qty 2

## 2015-01-30 NOTE — Progress Notes (Signed)
D) Pt has been in bed most of the day. States "I am not going to groups. I don't feel good". Affect is flat and mood depressed. Pt Denies SI and HI. Has no idea how he ended up at [facility] ED or why he was admitted to [facility]. Did not go to lunch and refused his noon meds.  A) Given support and provided with a brief 1:1. R) Denies SI and HI. States, I just don't feel good and would like to be able to sleep today.

## 2015-01-30 NOTE — Progress Notes (Signed)
Patient did attend the evening speaker AA meeting.  

## 2015-01-30 NOTE — Progress Notes (Signed)
D:Patient in the dayroom on approach.  Patient states, "I slept a lot today."  Patient states he felt that his medications were contributing.  Patient states overall his day was ok.  Patient states he did not have a goal for today.  Patient then states he hopes to be discharged Monday. Patient states he hopes to g to the [name] because that is where he has gone in hte past.  Patient states he is passive SI and states he has thoughts of harming people.  Patient does not states anyone specific but states he would not hurt anyone here.  Patient denies AVH.  Patient verbally contracts for safety.  A: Staff to monitor Q 15 mins for safety.  Encouragement and support offered.  Scheduled medications administered per orders.  Writer performed EKG on patient tonight.  Vistaril administered prn for anxiety.  Ambien administered prn for sleep. R: Patient remains safe on the unit.  Patient attended group tonight.  Patient visible on the unit.  Patient taking administered medications.

## 2015-01-30 NOTE — Plan of Care (Signed)
Problem: Alteration in mood & ability to function due to Goal: LTG-Patient demonstrates decreased signs of withdrawal (Patient demonstrates decreased signs of withdrawal to the point the patient is safe to return home and continue treatment in an outpatient setting)  Outcome: Progressing Pt shows decreased signs of withdrawal. Pt remains compliant with prescribed medications and requests PRN medications. Pt attended AA group this evening.

## 2015-01-30 NOTE — Progress Notes (Signed)
[facility] MD Progress Note  01/30/2015 12:38 PM [name]  MRN:  [id number] Subjective:  "I'm feeling bad, but I guess better than the day I came in."  Objective: Pt seen and chart reviewed. Pt is alert/oriented x4, clearly depressed, cooperative, and appropriate to situation. Pt continues to affirm suicidal ideation yet without plan/intent. Denies /homicidal ideation and psychosis and does not appear to be responding to internal stimuli. Pt reports poor sleep and appetite. Cites participation in groups and benefit from such, although not much today.   Principal Problem: Bipolar disorder, current episode depressed, severe, without psychotic features ([place]) Diagnosis:   Patient Active Problem List   Diagnosis Date Noted  . Bipolar I disorder, most recent episode depressed ([place]) [F31.30]     Priority: High  . Bipolar disorder, current episode depressed, severe, without psychotic features ([place]) [F31.4]     Priority: High  . Polysubstance dependence ([place]) [F19.20] 03/21/2012    Priority: High  . Affective psychosis, bipolar ([place]) [F31.9]   . Alcohol use disorder, severe, dependence ([place]) [F10.20]   . Cocaine use disorder, severe, dependence ([place]) [F14.20]   . Generalized abdominal pain [R10.84]   . Bipolar affective disorder, depressed, severe ([place]) [F31.4] 03/26/2014  . Right hip pain [M25.551]   . Medically noncompliant [Z91.19] 03/23/2014  . Dysuria [R30.0] 03/22/2013  . Headache [R51] 03/22/2013  . Anxiety [F41.9]   . Bipolar affective disorder ([place]) [F31.9]   . Polysubstance abuse [F19.10]   . Gout [M10.9]   . Vitamin D deficiency [E55.9]   . Hepatitis C [B19.20]    Total Time spent with patient: 45 minutes  Past Medical History:  Past Medical History  Diagnosis Date  . Arthritis     per pt rheumatoid   . Hypertension   . Anxiety   . Bipolar affective disorder ([place])   . Depression   . Alcohol abuse   . Cocaine abuse     smokes crack-cocaine  . GERD (gastroesophageal  reflux disease)   . Chronic back pain   . Tobacco abuse   . Hepatitis C   . Vitamin D deficiency   . Gout   . Hemorrhoids    History reviewed. No pertinent past surgical history. Family History:  Family History  Problem Relation Age of Onset  . Hypertension Brother   . Diabetes Brother   . Hypertension Mother   . Diabetes Mother   . Arthritis Mother   . Other Sister     bone disease   Social History:  History  Alcohol Use  . Yes    Comment: 24 oz--3x's wkly      History  Drug Use  . Yes  . Special: Marijuana, Cocaine    Comment: THC   cocaine,     Social History   Social History  . Marital Status: Single    Spouse Name: N/A  . Number of Children: N/A  . Years of Education: N/A   Social History Main Topics  . Smoking status: Current Every Day Smoker -- 0.25 packs/day    Types: Cigarettes  . Smokeless tobacco: Never Used     Comment: Pt declined offer of patch or gum  . Alcohol Use: Yes     Comment: 24 oz--3x's wkly   . Drug Use: Yes    Special: Marijuana, Cocaine     Comment: THC   cocaine,   . Sexual Activity: Not Currently   Other Topics Concern  . None   Social History Narrative  Lives alone in [place], [place]   Additional Social History:                         Sleep: Fair  Appetite:  Fair  Current Medications: Current Facility-Administered Medications  Medication Dose Route Frequency Provider Last Rate Last Dose  . acetaminophen (TYLENOL) tablet 650 mg  650 mg Oral Q6H PRN Shuvon B Rankin, NP   650 mg at 01/29/15 1953  . alum & mag hydroxide-simeth (MAALOX/MYLANTA) 200-200-20 MG/5ML suspension 30 mL  30 mL Oral Q4H PRN Shuvon B Rankin, NP      . ARIPiprazole (ABILIFY) tablet 10 mg  10 mg Oral Daily Shuvon B Rankin, NP   10 mg at 01/30/15 0842  . gabapentin (NEURONTIN) capsule 300 mg  300 mg Oral TID Shuvon B Rankin, NP   300 mg at 01/30/15 0842  . hydrOXYzine (ATARAX/VISTARIL) tablet 25 mg  25 mg Oral TID PRN Shuvon B Rankin, NP    25 mg at 01/29/15 1953  . ibuprofen (ADVIL,MOTRIN) tablet 600 mg  600 mg Oral Q6H PRN Harriet But[place], NP      . lithium carbonate capsule 450 mg  450 mg Oral BID WC Shuvon B Rankin, NP   450 mg at 01/30/15 0841  . magnesium hydroxide (MILK OF MAGNESIA) suspension 30 mL  30 mL Oral Daily PRN Shuvon B Rankin, NP      . methocarbamol (ROBAXIN) tablet 750 mg  750 mg Oral Q8H PRN Shuvon B Rankin, NP   750 mg at 01/29/15 1953  . ondansetron (ZOFRAN-ODT) disintegrating tablet 4 mg  4 mg Oral Q8H PRN Shuvon B Rankin, NP      . pantoprazole (PROTONIX) EC tablet 40 mg  40 mg Oral Daily Shuvon B Rankin, NP   40 mg at 01/30/15 0841  . tamsulosin (FLOMAX) capsule 0.4 mg  0.4 mg Oral Daily Shuvon B Rankin, NP   0.4 mg at 01/30/15 0842  . traZODone (DESYREL) tablet 100 mg  100 mg Oral QHS PRN Shuvon B Rankin, NP        Lab Results:  Results for orders placed or performed during the hospital encounter of 01/28/15 (from the past 48 hour(s))  Comprehensive metabolic panel     Status: Abnormal   Collection Time: 01/30/15  6:30 AM  Result Value Ref Range   Sodium 142 135 - 145 mmol/L   Potassium 3.1 (L) 3.5 - 5.1 mmol/L   Chloride 108 101 - 111 mmol/L   CO2 27 22 - 32 mmol/L   Glucose, Bld 112 (H) 65 - 99 mg/dL   BUN 5 (L) 6 - 20 mg/dL   Creatinine, Ser 0.90 0.61 - 1.24 mg/dL   Calcium 10.1 8.9 - 10.3 mg/dL   Total Protein 7.2 6.5 - 8.1 g/dL   Albumin 3.9 3.5 - 5.0 g/dL   AST 134 (H) 15 - 41 U/L   ALT 368 (H) 17 - 63 U/L   Alkaline Phosphatase 55 38 - 126 U/L   Total Bilirubin 0.6 0.3 - 1.2 mg/dL   GFR calc non Af Amer >60 >60 mL/min   GFR calc Af Amer >60 >60 mL/min    Comment: (NOTE) The eGFR has been calculated using the CKD EPI equation. This calculation has not been validated in all clinical situations. eGFR's persistently <60 mL/min signify possible Chronic Kidney Disease.    Anion gap 7 5 - 15    Comment: Performed at [facility]  Physical Findings: AIMS: Facial  and Oral Movements Muscles of Facial Expression: None, normal Lips and Perioral Area: None, normal Jaw: None, normal Tongue: None, normal,Extremity Movements Upper (arms, wrists, hands, fingers): None, normal Lower (legs, knees, ankles, toes): None, normal, Trunk Movements Neck, shoulders, hips: None, normal, Overall Severity Severity of abnormal movements (highest score from questions above): None, normal Incapacitation due to abnormal movements: None, normal Patient's awareness of abnormal movements (rate only patient's report): No Awareness, Dental Status Current problems with teeth and/or dentures?: No Does patient usually wear dentures?: No  CIWA:    COWS:     Musculoskeletal: Strength & Muscle Tone: within normal limits Gait & Station: normal Patient leans: N/A  Psychiatric Specialty Exam: Review of Systems  Musculoskeletal: Positive for myalgias.  Psychiatric/Behavioral: Positive for depression, suicidal ideas and substance abuse. Negative for hallucinations. The patient is nervous/anxious and has insomnia.   All other systems reviewed and are negative.   Blood pressure 146/97, pulse 118, temperature 98.1 F (36.7 C), temperature source Oral, resp. rate 16, height '5\' 7"'  (1.702 m), weight 117.935 kg (260 lb).Body mass index is 40.71 kg/(m^2).  General Appearance: Casual and Fairly Groomed  Engineer, water::  Good  Speech:  Clear and Coherent and Normal Rate  Volume:  Decreased  Mood:  Depressed  Affect:  Appropriate, Congruent and Depressed  Thought Process:  Circumstantial  Orientation:  Full (Time, Place, and Person)  Thought Content:  WDL  Suicidal Thoughts:  Yes.  without intent/plan  Homicidal Thoughts:  No  Memory:  Immediate;   Fair Recent;   Fair Remote;   Fair  Judgement:  Fair  Insight:  Fair  Psychomotor Activity:  Normal  Concentration:  Fair  Recall:  [employer] [place]  Language: Fair  Akathisia:  No  Handed:    AIMS (if indicated):      Assets:  Communication Skills Desire for Improvement Physical Health Resilience Social Support  ADL's:  Intact  Cognition: WNL  Sleep:  Number of Hours: 6.5   Treatment Plan Summary: Daily contact with patient to assess and evaluate symptoms and progress in treatment and Medication management  Medications: -Kdur 19m x 2 doses, recheck CMP on 10/23 PM (K+ low at 3.1) -continue Abilify 13mdaily -continue Neurontin 30052mid -continue vistaril 59m72md prn anxiety -continue lithium 450mg49m wc -continue robaxin 750mg 64mprn myalgias -continue home meds (protonix/flomax) -continue Trazodone 100mg q56mrn insomnia -Kdur 40mg x 33mses (Target K+ is 3.5-3.8 serum, will recheck tomorrow)  Labs:  -Reviewed CMP, K+ 3.1 (L, still, not improving much, will treat with Kdur), HCV genotype 1a quantitative RNA in process, will keep checking. Will recheck CMP 10/23 PM draw to monitor hypokalemia -Ammonia 10/23 ordered to rule in/out hepatic encephalopathy as a cause of pt's confusion and lethargy as AST/ALT are dramatically improving. We presumed this was due to chronic HCV infection, although it has improved >50% in sub-48 hours which is not consistent with the chronic HCV etiology and more consistent with improving acute hepatic encephalopathy potentially worsened by pt's alcohol abuse and poor hydration status. Will evaluate although levels may be dropping rapidly based on AST/ALT improvement and may not be remarkable in lab results tomorrow.   Procedures: Qtc was prolonged >475, will recheck STAT 12-lead now as pt is still severely hypokalemic  [place],Benjamine Mola 01/30/2015, 12:38 PM  I reviewed chart and agreed with the findings and treatment Plan.  Roshanna Cimino ArfBerniece Andreas

## 2015-01-30 NOTE — Progress Notes (Signed)
D: Pt is seen in the dayroom interacting with peers. Upon assessment, pt appears irritable but is polite to staff. Pt says "I'm sorry I'm so grumpy, I'm just in pain". Pt reports SI at this time. Pt contracts for safety. Pt denies HI/AVH. Pt reports generalized pain "all over". No concerns or complaints at this time. A: Pt is given medication for pain. Emotional support given. q15 minute safety checks maintained. R: Pt remains free from harm.

## 2015-01-30 NOTE — [facility] Group Notes (Signed)
[facility] Group Notes:  (Nursing/MHT/Case Management/Adjunct)  Date:  01/30/2015  Time:  3:00 PM  Type of Therapy:  Psychoeducational Skills  Participation Level:  Did Not Attend  Participation Quality:  Did Not Attend  Affect:  Did Not Attend  Cognitive:  Did Not Attend  Insight:  None  Engagement in Group:  Did Not Attend  Modes of Intervention:  Did Not Attend  Summary of Progress/Problems: Pt did not attend patient self inventory group.  [name] 01/30/2015, 3:00 PM 

## 2015-01-30 NOTE — [facility] Group Notes (Signed)
[facility] Group Notes:  (Nursing/MHT/Case Management/Adjunct)  Date:  01/30/2015  Time:  10:58 AM  Type of Therapy:  Psychoeducational Skills  Participation Level:  Did Not Attend  Participation Quality:  Did Not Attend  Affect:  Did Not Attend  Cognitive:  Did Not Attend  Insight:  None  Engagement in Group:  Did Not Attend  Modes of Intervention:  Did Not Attend  Summary of Progress/Problems: Pt did not attend patient self inventory group.   Jacquelyne Balint Shanta 01/30/2015, 10:58 AM

## 2015-01-30 NOTE — [facility] Group Notes (Signed)
[facility] Group Notes: (Clinical Social Work)   01/30/2015      Type of Therapy:  Group Therapy   Participation Level:  Did Not Attend despite MHT prompting   Ellarae Nevitt Grossman-Orr, LCSW 01/30/2015, 12:12 PM     

## 2015-01-31 DIAGNOSIS — F3130 Bipolar disorder, current episode depressed, mild or moderate severity, unspecified: Secondary | ICD-10-CM

## 2015-01-31 LAB — COMPREHENSIVE METABOLIC PANEL
ALT: 267 U/L — ABNORMAL HIGH (ref 17–63)
AST: 82 U/L — ABNORMAL HIGH (ref 15–41)
Albumin: 4.1 g/dL (ref 3.5–5.0)
Alkaline Phosphatase: 58 U/L (ref 38–126)
Anion gap: 5 (ref 5–15)
BUN: 6 mg/dL (ref 6–20)
CO2: 25 mmol/L (ref 22–32)
Calcium: 9.7 mg/dL (ref 8.9–10.3)
Chloride: 110 mmol/L (ref 101–111)
Creatinine, Ser: 0.9 mg/dL (ref 0.61–1.24)
GFR calc Af Amer: >60 mL/min (ref >60)
GFR calc non Af Amer: >60 mL/min (ref >60)
Glucose, Bld: 95 mg/dL (ref 65–99)
Potassium: 4.2 mmol/L (ref 3.5–5.1)
Sodium: 140 mmol/L (ref 135–145)
Total Bilirubin: 0.4 mg/dL (ref 0.3–1.2)
Total Protein: 7.5 g/dL (ref 6.5–8.1)

## 2015-01-31 LAB — AMMONIA: Ammonia: 37 umol/L — ABNORMAL HIGH (ref 9–35)

## 2015-01-31 NOTE — Progress Notes (Signed)
Patient did attend the evening speaker AA meeting.  

## 2015-01-31 NOTE — Progress Notes (Signed)
D) Pt has been in bed most of the day. Has not attended any groups. Rates his depression at 9, hopelessness at a 9 and his anxiety at a 9. Denies SI and HI. Pt states that he has a hard time getting up and doesn't feel well in the mornings. Presently is in the dayroom yelling at the TV because his team is winning.  A) Given support but encouraged to get out of bed and attend the groups. R) Pt denies SI and HI.

## 2015-01-31 NOTE — Progress Notes (Signed)
[facility] MD Progress Note  01/31/2015  [name]  MRN:  [id number] Subjective:  "I'm feeling so much better today. I slept well, I have more energy, I'm not hurting. I feel very different."  Objective: Pt seen and chart reviewed. Pt is alert/oriented x4, optimistic about his care, cooperative, and appropriate to situation. Pt denies suicidal ideation today and reports feeling much better. Denies homicidal ideation and psychosis and does not appear to be responding to internal stimuli. Pt reports improving sleep and appetite. Cites participation in groups and benefit from such, with more participation today. Pt looks more alert and is smiling at times during assessment. Denies pain, lethargy, malaise.   Principal Problem: Bipolar disorder, current episode depressed, severe, without psychotic features ([place]) Diagnosis:   Patient Active Problem List   Diagnosis Date Noted  . Bipolar I disorder, most recent episode depressed ([place]) [F31.30]     Priority: High  . Bipolar disorder, current episode depressed, severe, without psychotic features ([place]) [F31.4]     Priority: High  . Polysubstance dependence ([place]) [F19.20] 03/21/2012    Priority: High  . Affective psychosis, bipolar ([place]) [F31.9]   . Alcohol use disorder, severe, dependence ([place]) [F10.20]   . Cocaine use disorder, severe, dependence ([place]) [F14.20]   . Generalized abdominal pain [R10.84]   . Bipolar affective disorder, depressed, severe ([place]) [F31.4] 03/26/2014  . Right hip pain [M25.551]   . Medically noncompliant [Z91.19] 03/23/2014  . Dysuria [R30.0] 03/22/2013  . Headache [R51] 03/22/2013  . Anxiety [F41.9]   . Bipolar affective disorder ([place]) [F31.9]   . Polysubstance abuse [F19.10]   . Gout [M10.9]   . Vitamin D deficiency [E55.9]   . Hepatitis C [B19.20]    Total Time spent with patient: 15 minutes  Past Medical History:  Past Medical History  Diagnosis Date  . Arthritis     per pt rheumatoid   . Hypertension   .  Anxiety   . Bipolar affective disorder ([place])   . Depression   . Alcohol abuse   . Cocaine abuse     smokes crack-cocaine  . GERD (gastroesophageal reflux disease)   . Chronic back pain   . Tobacco abuse   . Hepatitis C   . Vitamin D deficiency   . Gout   . Hemorrhoids    History reviewed. No pertinent past surgical history. Family History:  Family History  Problem Relation Age of Onset  . Hypertension Brother   . Diabetes Brother   . Hypertension Mother   . Diabetes Mother   . Arthritis Mother   . Other Sister     bone disease   Social History:  History  Alcohol Use  . Yes    Comment: 24 oz--3x's wkly      History  Drug Use  . Yes  . Special: Marijuana, Cocaine    Comment: THC   cocaine,     Social History   Social History  . Marital Status: Single    Spouse Name: N/A  . Number of Children: N/A  . Years of Education: N/A   Social History Main Topics  . Smoking status: Current Every Day Smoker -- 0.25 packs/day    Types: Cigarettes  . Smokeless tobacco: Never Used     Comment: Pt declined offer of patch or gum  . Alcohol Use: Yes     Comment: 24 oz--3x's wkly   . Drug Use: Yes    Special: Marijuana, Cocaine     Comment: THC  cocaine,   . Sexual Activity: Not Currently   Other Topics Concern  . None   Social History Narrative   Lives alone in [place], [place]   Additional Social History:                         Sleep: Fair  Appetite:  Fair  Current Medications: Current Facility-Administered Medications  Medication Dose Route Frequency Provider Last Rate Last Dose  . acetaminophen (TYLENOL) tablet 650 mg  650 mg Oral Q6H PRN Shuvon B Rankin, NP   650 mg at 01/29/15 1953  . alum & mag hydroxide-simeth (MAALOX/MYLANTA) 200-200-20 MG/5ML suspension 30 mL  30 mL Oral Q4H PRN Shuvon B Rankin, NP      . ARIPiprazole (ABILIFY) tablet 10 mg  10 mg Oral Daily Shuvon B Rankin, NP   10 mg at 01/31/15 0907  . gabapentin (NEURONTIN) capsule 300 mg   300 mg Oral TID Shuvon B Rankin, NP   300 mg at 01/31/15 1832  . hydrOXYzine (ATARAX/VISTARIL) tablet 25 mg  25 mg Oral TID PRN Shuvon B Rankin, NP   25 mg at 01/30/15 2232  . ibuprofen (ADVIL,MOTRIN) tablet 600 mg  600 mg Oral Q6H PRN Harriet [place], NP   600 mg at 01/30/15 2232  . lithium carbonate capsule 450 mg  450 mg Oral BID WC Shuvon B Rankin, NP   450 mg at 01/31/15 1832  . magnesium hydroxide (MILK OF MAGNESIA) suspension 30 mL  30 mL Oral Daily PRN Shuvon B Rankin, NP      . methocarbamol (ROBAXIN) tablet 750 mg  750 mg Oral Q8H PRN Shuvon B Rankin, NP   750 mg at 01/30/15 1638  . ondansetron (ZOFRAN-ODT) disintegrating tablet 4 mg  4 mg Oral Q8H PRN Shuvon B Rankin, NP      . pantoprazole (PROTONIX) EC tablet 40 mg  40 mg Oral Daily Shuvon B Rankin, NP   40 mg at 01/31/15 0907  . tamsulosin (FLOMAX) capsule 0.4 mg  0.4 mg Oral Daily Shuvon B Rankin, NP   0.4 mg at 01/31/15 0908  . traZODone (DESYREL) tablet 100 mg  100 mg Oral QHS PRN Shuvon B Rankin, NP        Lab Results:  Results for orders placed or performed during the hospital encounter of 01/28/15 (from the past 48 hour(s))  Comprehensive metabolic panel     Status: Abnormal   Collection Time: 01/30/15  6:30 AM  Result Value Ref Range   Sodium 142 135 - 145 mmol/L   Potassium 3.1 (L) 3.5 - 5.1 mmol/L   Chloride 108 101 - 111 mmol/L   CO2 27 22 - 32 mmol/L   Glucose, Bld 112 (H) 65 - 99 mg/dL   BUN 5 (L) 6 - 20 mg/dL   Creatinine, Ser 0.90 0.61 - 1.24 mg/dL   Calcium 10.1 8.9 - 10.3 mg/dL   Total Protein 7.2 6.5 - 8.1 g/dL   Albumin 3.9 3.5 - 5.0 g/dL   AST 134 (H) 15 - 41 U/L   ALT 368 (H) 17 - 63 U/L   Alkaline Phosphatase 55 38 - 126 U/L   Total Bilirubin 0.6 0.3 - 1.2 mg/dL   GFR calc non Af Amer >60 >60 mL/min   GFR calc Af Amer >60 >60 mL/min    Comment: (NOTE) The eGFR has been calculated using the CKD EPI equation. This calculation has not been validated in all clinical situations. eGFR's persistently  <  60 mL/min signify possible Chronic Kidney Disease.    Anion gap 7 5 - 15    Comment: Performed at [name]    Physical Findings: AIMS: Facial and Oral Movements Muscles of Facial Expression: None, normal Lips and Perioral Area: None, normal Jaw: None, normal Tongue: None, normal,Extremity Movements Upper (arms, wrists, hands, fingers): None, normal Lower (legs, knees, ankles, toes): None, normal, Trunk Movements Neck, shoulders, hips: None, normal, Overall Severity Severity of abnormal movements (highest score from questions above): None, normal Incapacitation due to abnormal movements: None, normal Patient's awareness of abnormal movements (rate only patient's report): No Awareness, Dental Status Current problems with teeth and/or dentures?: No Does patient usually wear dentures?: No  CIWA:    COWS:     Musculoskeletal: Strength & Muscle Tone: within normal limits Gait & Station: normal Patient leans: N/A  Psychiatric Specialty Exam: Review of Systems  Musculoskeletal: Positive for myalgias.  Psychiatric/Behavioral: Positive for depression and substance abuse. Negative for suicidal ideas and hallucinations. The patient is nervous/anxious and has insomnia.   All other systems reviewed and are negative.   Blood pressure 146/97, pulse 118, temperature 98.1 F (36.7 C), temperature source Oral, resp. rate 16, height '5\' 7"'  (1.702 m), weight 117.935 kg (260 lb).Body mass index is 40.71 kg/(m^2).  General Appearance: Casual and Fairly Groomed  Engineer, water::  Good  Speech:  Clear and Coherent and Normal Rate  Volume:  Decreased  Mood:  Depressed  Affect:  Appropriate, Congruent and Depressed  Thought Process:  Circumstantial  Orientation:  Full (Time, Place, and Person)  Thought Content:  WDL  Suicidal Thoughts:  No  Homicidal Thoughts:  No  Memory:  Immediate;   Fair Recent;   Fair Remote;   Fair  Judgement:  Fair  Insight:  Fair  Psychomotor  Activity:  Normal  Concentration:  Fair  Recall:  [employer] [place]  Language: Fair  Akathisia:  No  Handed:    AIMS (if indicated):     Assets:  Communication Skills Desire for Improvement Physical Health Resilience Social Support  ADL's:  Intact  Cognition: WNL  Sleep:  Number of Hours: 5.25   Treatment Plan Summary: Daily contact with patient to assess and evaluate symptoms and progress in treatment and Medication management  Medications: -Kdur 3m x 2 doses, recheck CMP on 10/23 PM (K+ low at 3.1) -continue Abilify 13mdaily -continue Neurontin 30053mid -continue vistaril 62m57md prn anxiety -continue lithium 450mg74m wc -continue robaxin 750mg 62mprn myalgias -continue home meds (protonix/flomax) -continue Trazodone 100mg q67mrn insomnia -Kdur 40mg x 12mses (Target K+ is 3.5-3.8 serum, will recheck tomorrow)  01/31/15: Pt is improving greatly on current meds; will continue as above.  Labs:  -Reviewed CMP, K+ 3.1 (L, still, not improving much, will treat with Kdur), HCV genotype 1a quantitative RNA in process, will keep checking. Will recheck CMP 10/23 PM draw to monitor hypokalemia -Ammonia 10/23 ordered to rule in/out hepatic encephalopathy as a cause of pt's confusion and lethargy as AST/ALT are dramatically improving. We presumed this was due to chronic HCV infection, although it has improved >50% in sub-48 hours which is not consistent with the chronic HCV etiology and more consistent with improving acute hepatic encephalopathy potentially worsened by pt's alcohol abuse and poor hydration status. Will evaluate although levels may be dropping rapidly based on AST/ALT improvement and may not be remarkable in lab results tomorrow.   01/31/15: LABS PENDING   Procedures: Qtc  was prolonged >475, will recheck STAT 12-lead now as pt is still severely hypokalemic  01/31/15: Reviewed EKG, QTc improved to 442, pt is feeling much better today, K+ labs  pending  Benjamine Mola, FNP-BC 01/31/2015, 1:53 PM  I reviewed chart and agreed with the findings and treatment Plan.  Berniece Andreas, MD

## 2015-01-31 NOTE — Progress Notes (Addendum)
D: Patient in the hallway on approach.  Patient states he had a good day.  Patient states, "I took back my 72 hour thing and I will talk with Dr. Dub Mikes tomorrow."  Patient states he was not ready to be discharged today.  Patient states he needs to be discharged during the week so he can follow up with the [name].  Patient denies SI/HI and denies AVH tonight.   Patient states he did not have a goal. A: Staff to monitor Q 15 mins for safety.  Encouragement and support offered.  Scheduled medications administered per orders.  Ibuprofen administered prn for pain.  Vistaril administered prn for anxiety R: Patient remains safe on the unit.  Patient attended group tonight.  Patient visible on the unit and interacting woth peers.

## 2015-01-31 NOTE — Plan of Care (Signed)
Problem: Alteration in mood & ability to function due to Goal: LTG-Pt reports reduction in suicidal thoughts (Patient reports reduction in suicidal thoughts and is able to verbalize a safety plan for whenever patient is feeling suicidal)  Outcome: Not Progressing Patient states he is passive SI tonight.  Patient verbally contracts for safety.

## 2015-01-31 NOTE — [facility] Group Notes (Signed)
[facility] Group Notes: (Clinical Social Work)   01/31/2015      Type of Therapy:  Group Therapy   Participation Level:  Did Not Attend despite MHT prompting   Gennavieve Huq Grossman-Orr, LCSW 01/31/2015, 12:21 PM     

## 2015-01-31 NOTE — BHH Counselor (Signed)
Pt withdrew 72-hour discharge notice, stating he is too sick to go home today.  Additionally, he states he has no way to get home today.  He is adamant he has to leave tomorrow.  CSW informed him that the doctor will assess for discharge each day.  Ambrose Mantle, LCSW 01/31/2015, 8:55 AM

## 2015-01-31 NOTE — BHH Suicide Risk Assessment (Signed)
[facility] INPATIENT:  Family/Significant Other Suicide Prevention Education  Suicide Prevention Education:  Patient Refusal for Family/Significant Other Suicide Prevention Education: The patient [name] has refused to provide written consent for family/significant other to be provided Family/Significant Other Suicide Prevention Education during admission and/or prior to discharge.  Physician notified.  Sarina Ser 01/31/2015, 8:59 AM

## 2015-01-31 NOTE — BHH Counselor (Addendum)
Late Entry  Clinical Social Work Note  CSW made multiple attempts throughout the day 01/30/15 to rouse pt to do Psychosocial Assessment and discharge planning and he refused each time to get up, stating he was nauseated and vomiting.  CSW completed PSA on 10/23 - it is dated 01/29/15 by previous CSW who attempted to complete.  Ambrose Mantle, LCSW 01/31/2015, 8:54 AM

## 2015-02-01 MED ORDER — PANTOPRAZOLE SODIUM 40 MG PO TBEC: 40.0000 mg | DELAYED_RELEASE_TABLET | Freq: Every day | ORAL | Status: DC

## 2015-02-01 MED ORDER — TAMSULOSIN HCL 0.4 MG PO CAPS: 0.4000 mg | ORAL_CAPSULE | Freq: Every day | ORAL | Status: DC

## 2015-02-01 MED ORDER — LITHIUM CARBONATE 150 MG PO CAPS: 450.0000 mg | ORAL_CAPSULE | Freq: Two times a day (BID) | ORAL | Status: DC

## 2015-02-01 MED ORDER — TRAZODONE HCL 100 MG PO TABS: 100.0000 mg | ORAL_TABLET | Freq: Every evening | ORAL | Status: DC | PRN

## 2015-02-01 MED ORDER — GABAPENTIN 300 MG PO CAPS: 300.0000 mg | ORAL_CAPSULE | Freq: Three times a day (TID) | ORAL | Status: DC

## 2015-02-01 MED ORDER — ARIPIPRAZOLE 10 MG PO TABS: 10.0000 mg | ORAL_TABLET | Freq: Every day | ORAL | Status: DC

## 2015-02-01 MED ORDER — HYDROXYZINE HCL 25 MG PO TABS: 25.0000 mg | ORAL_TABLET | Freq: Three times a day (TID) | ORAL | Status: DC | PRN
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
## 2015-02-01 NOTE — Progress Notes (Signed)
  [facility] Adult Case Management Discharge Plan :  Will you be returning to the same living situation after discharge:  Yes,  return home At discharge, do you have transportation home?: Yes,  bus pass in chart Do you have the ability to pay for your medications: Yes,  Medicare  Release of information consent forms completed and CSW submitted to medical records.   Patient to Follow up at: Follow-up Information    Follow up with [facility] On 02/01/2015.   Why:  Clinical Social Worker [facility] with [facility] in attempt to make follow-up appt with Dr. Kathrynn Running for med managment and Smitty Cords (counselor). The office will contact you directly with appt times/dates. Please call at discharge to follow-up. [phone].   Contact information:   [address] [facility] [place]  [address] Telephone:  [phone]       Patient denies SI/HI: Yes,  during self report.     Safety Planning and Suicide Prevention discussed: Yes,  SPE completed with pt, as he refused to consent to family contact. SPI pamphlet also provided to pt and he was encouraged to share this information with support network.   Have you used any form of tobacco in the last 30 days? (Cigarettes, Smokeless Tobacco, Cigars, and/or Pipes): Yes  Has patient been referred to the Quitline?: Patient refused referral  Smart, Lebron Quam  02/01/2015, 11:08 AM

## 2015-02-01 NOTE — BHH Suicide Risk Assessment (Signed)
[facility] Discharge Suicide Risk Assessment   Demographic Factors:  Male  Total Time spent with patient: 30 minutes  Musculoskeletal: Strength & Muscle Tone: within normal limits Gait & Station: normal Patient leans: normal  Psychiatric Specialty Exam: Physical Exam  Review of Systems  Constitutional: Negative.   HENT: Negative.   Eyes: Negative.   Respiratory: Negative.   Cardiovascular: Negative.   Gastrointestinal: Negative.   Genitourinary: Negative.   Musculoskeletal: Positive for back pain.  Skin: Negative.   Neurological: Negative.   Endo/Heme/Allergies: Negative.   Psychiatric/Behavioral: Positive for substance abuse.    Blood pressure 146/97, pulse 118, temperature 98.1 F (36.7 C), temperature source Oral, resp. rate 16, height 5\' 7"  (1.702 m), weight 117.935 kg (260 lb).Body mass index is 40.71 kg/(m^2).  General Appearance: Fairly Groomed  ::  Fair  Speech:  Clear and Coherent409  Volume:  Normal  Mood:  Euthymic  Affect:  Restricted  Thought Process:  Coherent and Goal Directed  Orientation:  Full (Time, Place, and Person)  Thought Content:  plans as he moves on, relapse prevention plan  Suicidal Thoughts:  No  Homicidal Thoughts:  No  Memory:  Immediate;   Fair Recent;   Fair Remote;   Fair  Judgement:  Fair  Insight:  Present and Shallow  Psychomotor Activity:  Normal  Concentration:  Fair  Recall:  [id number] of Knowledge:Fair  Language: Fair  Akathisia:  No  Handed:  Right  AIMS (if indicated):     Assets:  Desire for Improvement  Sleep:  Number of Hours: 5.5  Cognition: WNL  ADL's:  Intact   Have you used any form of tobacco in the last 30 days? (Cigarettes, Smokeless Tobacco, Cigars, and/or Pipes): Yes  Has this patient used any form of tobacco in the last 30 days? (Cigarettes, Smokeless Tobacco, Cigars, and/or Pipes) Yes, A prescription for an FDA-approved tobacco cessation medication was offered at discharge and the patient  refused  Mental Status Per Nursing Assessment::   On Admission:     Current Mental Status by Physician: In full contact with reality. There are no active S/S of withdrawal. There are no active SI/HI plan or intent. He is requesting to be D/C today. He is going to pursue outpatient follow up with the [id number]. States he wants to get his life back together. He is committed to abstinence. States he is aware that he has to make lots of changes.   Loss Factors: NA  Historical Factors: NA  Risk Reduction Factors:   Positive therapeutic relationship  Continued Clinical Symptoms:  Bipolar Disorder:   Depressive phase Alcohol/Substance Abuse/Dependencies  Cognitive Features That Contribute To Risk:  Closed-mindedness, Polarized thinking and Thought constriction (tunnel vision)    Suicide Risk:  Minimal: No identifiable suicidal ideation.  Patients presenting with no risk factors but with morbid ruminations; may be classified as minimal risk based on the severity of the depressive symptoms  Principal Problem: Bipolar disorder, current episode depressed, severe, without psychotic features [facility]) Discharge Diagnoses:  Patient Active Problem List   Diagnosis Date Noted  . Alcohol use disorder, severe, dependence ([facility]) [F10.20]     Priority: High  . Cocaine use disorder, severe, dependence ([facility]) [F14.20]     Priority: High  . Bipolar disorder, current episode depressed, severe, without psychotic features ([facility]) [F31.4]     Priority: High  . Polysubstance dependence ([facility]) [F19.20] 03/21/2012    Priority: High  . Bipolar affective disorder ([facility]) [F31.9]     Priority:  High  . Affective psychosis, bipolar ([facility]) [F31.9]   . Generalized abdominal pain [R10.84]   . Bipolar I disorder, most recent episode depressed ([facility]) [F31.30]   . Bipolar affective disorder, depressed, severe ([facility]) [F31.4] 03/26/2014  . Right hip pain [M25.551]   . Medically noncompliant [Z91.19] 03/23/2014  . Dysuria [R30.0]  03/22/2013  . Headache [R51] 03/22/2013  . Anxiety [F41.9]   . Polysubstance abuse [F19.10]   . Gout [M10.9]   . Vitamin D deficiency [E55.9]   . Hepatitis C [B19.20]     Follow-up Information    Follow up with [facility] On 02/01/2015.   Why:  Clinical Social Worker [facility] with [facility] in attempt to make follow-up appt with Dr. Kathrynn Running for med managment and Smitty Cords (counselor). The office will contact you directly with appt times/dates. Please call at discharge to follow-up. [phone].   Contact information:   [address] [facility] [place]  [address] Telephone:  [phone]       Plan Of Care/Follow-up recommendations:  Activity:  as tolerated Diet:  regular Follow up as above Is patient on multiple antipsychotic therapies at discharge:  No   Has Patient had three or more failed trials of antipsychotic monotherapy by history:  No  Recommended Plan for Multiple Antipsychotic Therapies: NA    Payson Crumby A 02/01/2015, 1:59 PM

## 2015-02-01 NOTE — Tx Team (Signed)
Interdisciplinary Treatment Plan Update (Adult)  Date:  02/01/2015  Time Reviewed:  11:06 AM   Progress in Treatment: Attending groups: No. Participating in groups:  No. Taking medication as prescribed:  Yes. Tolerating medication:  Yes. Family/Significant othe contact made:  SPE completed with pt, as he refused to consent to family contact.  Patient understands diagnosis:  Yes. Seeking treatment for SI, depression, and substance abuse. Pt minimizes substance use and states that he is here for depression/SI mainly.  Discussing patient identified problems/goals with staff:  Yes. Medical problems stabilized or resolved:  Yes. Denies suicidal/homicidal ideation: Yes. Issues/concerns per patient self-inventory:  Other:  Discharge Plan or Barriers: Pt plans to return home. Bus pass provided per his request. Multiple attempts made to schedule follow-up with mental health provider and counselor through the [facility] instructed to call at d/c to follow-up.   Reason for Continuation of Hospitalization: none  Comments:  [name] is an 54 y.o. male who presents to [facility] voluntarily stating that he called the [place] crisis hotline stating that he was suicidal and he uses drugs. Patient states that he uses "pills, crack/cocaine and some weed" and declined to answer further questions. Patient states that "I use some drugs but I'm here because I'm suicidal." When asked questions about drug use patient states "that's not important, I got suicidal ideations and I want to get some help." Patient refused to answer further questions in the assessment and remained in the bed with his head under the blanket. Patient was able to identify his name and date of birth and the city and state. Patient states that he does not have a guardian and was asked the assessment questions several times. Patient appeared to be sleeping and would wake up and say "I'm trying to answer you ma'am" and then would sit silently  under the blanket. Counselor spoke with EDP Dr. Tyrone Nine who states that the patient went [facility] stating that he was having a seizure and was discharged and called 911 stating that he was suicidal and would like to go to the [place] for treatment. Patient informed EDP that he was suicidal with a plan to jump in front of a car. Patient was not responsive when asked questions about what he told the EDP. Patient BAL <5 and UDS +cocaine and THC. 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 Patient verbalized understanding and agreed to treatment plan. CSW reviewed [facility] "Discharge Process and Patient Involvement" Form. Pt verbalized understanding of information provided and signed form.    Review of initial/current patient goals per problem list:  1. Goal(s): Patient will participate in aftercare plan  Met:Yes.   Target date: at discharge  As evidenced by: Patient will participate within aftercare plan AEB aftercare provider and housing plan at discharge being identified.  10/21: CSW assessing for appropriate referrals. Pt did not attend morning d/c planning group.   10/24: Pt plans to return home and follow-up at [facility] for med management and counseling.   2. Goal (s): Patient will exhibit decreased depressive symptoms and suicidal ideations.  Met: Yes   Target date: at discharge  As evidenced by: Patient will utilize self rating of depression at 3 or below and demonstrate decreased signs of depression or be deemed stable for discharge by MD.  10/21: Pt rates depression as  high. No SI/HI/AVH reported.   10/24: Pt reports low depression and no SI. Pt appears to be presenting at this baseline.   3. Goal(s): Patient will demonstrate decreased signs of withdrawal due to substance  abuse  Met:Yes  Target date:at discharge   As evidenced by: Patient will produce a CIWA/COWS score of 0, have stable vitals signs, and no symptoms of withdrawal.  10/21: Pt reports no signs of withdrawal with COW/CIWA not taken and stable vitals.    Attendees: Patient:   02/01/2015 11:06 AM   Family:   02/01/2015 11:06 AM   Physician:  Dr. Carlton Adam, MD 02/01/2015 11:06 AM   Nursing:   Conchita Paris RN 02/01/2015 11:06 AM   Clinical Social Worker: Maxie Better, [place]  02/01/2015 11:06 AM   Clinical Social Worker: Erasmo Downer Drinkard LCSWA; Peri Maris LCSWA 02/01/2015 11:06 AM   Other:  Gerline Legacy Nurse Case Manager 02/01/2015 11:06 AM   Other:  Lucinda Dell; [facility]  02/01/2015 11:06 AM   Other:   02/01/2015 11:06 AM   Other:  02/01/2015 11:06 AM   Other:  02/01/2015 11:06 AM   Other:  02/01/2015 11:06 AM    02/01/2015 11:06 AM    02/01/2015 11:06 AM    02/01/2015 11:06 AM    02/01/2015 11:06 AM    Scribe for Treatment Team:   Maxie Better, [place]  02/01/2015 11:06 AM

## 2015-02-01 NOTE — Progress Notes (Signed)
Patient ID: [name], male   DOB: 04/14/1960, 54 y.o.   MRN: [id number] PER STATE REGULATIONS 482.30  THIS CHART WAS REVIEWED FOR MEDICAL NECESSITY WITH RESPECT TO THE PATIENT'S ADMISSION/ DURATION OF STAY.  NEXT REVIEW DATE:   02/05/2015  Willa Rough, RN, BSN CASE MANAGER

## 2015-02-01 NOTE — Progress Notes (Signed)
Recreation Therapy Notes  Date: 10.24.2016 Time: 9:30am Location: [address] Hall Group Room   Group Topic: Stress Management  Goal Area(s) Addresses:  Patient will actively participate in stress management techniques presented during session.   Behavioral Response: Did not attend.   Marykay Lex Mikaila Grunert, LRT/CTRS        Kedric Bumgarner L 02/01/2015 10:00 AM

## 2015-02-01 NOTE — Progress Notes (Signed)
Patient ID: [name], male   DOB: May 27, 1960, 54 y.o.   MRN: [id number]  D: Patient reports ready for discharge. Denies any SI/HI or a/v hallucinations. Patient at baseline of functioning. A: Obtained all belongings, prescriptions, and [facility] is to call [facility] with follow-up appointment or he call them. R: Given bus pass.

## 2015-02-01 NOTE — Clinical Social Work Note (Signed)
CSW left two messages for [facility] in attempt to schedule appts with pt's psychiatrist and counselor. Messages left requesting call back to both social worker and patient. No call backs at this point. Pt aware that he must contact his provider at discharge if they have not yet left him a message with appt information. Pt given bus pass per his request and [employer] information.   Trula Slade, LCSWA Clinical Social Worker 02/01/2015 11:05 AM

## 2015-02-03 ENCOUNTER — Encounter: Payer: Self-pay | Admitting: Internal Medicine

## 2015-02-04 LAB — HCV RNA QUANT RFLX ULTRA OR GENOTYP
HCV RNA Qnt(log copy/mL): 4.892 log10 IU/mL
HepC Qn: 77900 IU/mL

## 2015-02-04 LAB — HEPATITIS C GENOTYPE

## 2015-06-12 ENCOUNTER — Encounter (HOSPITAL_COMMUNITY): Payer: Self-pay

## 2015-06-12 ENCOUNTER — Emergency Department (HOSPITAL_COMMUNITY)
Admission: EM | Admit: 2015-06-12 | Discharge: 2015-06-12 | Discharge status: Against Medical Advice | Payer: Medicare Other

## 2015-06-12 ENCOUNTER — Emergency Department (HOSPITAL_COMMUNITY): Admission: EM | Admit: 2015-06-12 | Discharge: 2015-06-12 | Discharge status: Against Medical Advice | Payer: Self-pay

## 2015-06-12 DIAGNOSIS — F319 Bipolar disorder, unspecified: Secondary | ICD-10-CM | POA: Insufficient documentation

## 2015-06-12 DIAGNOSIS — G8929 Other chronic pain: Secondary | ICD-10-CM | POA: Insufficient documentation

## 2015-06-12 DIAGNOSIS — N189 Chronic kidney disease, unspecified: Secondary | ICD-10-CM | POA: Insufficient documentation

## 2015-06-12 DIAGNOSIS — M549 Dorsalgia, unspecified: Secondary | ICD-10-CM

## 2015-06-12 DIAGNOSIS — K219 Gastro-esophageal reflux disease without esophagitis: Secondary | ICD-10-CM | POA: Insufficient documentation

## 2015-06-12 HISTORY — DX: Bipolar disorder, unspecified: F31.9

## 2015-06-12 HISTORY — DX: Chronic kidney disease, unspecified: N18.9

## 2015-06-12 NOTE — ED Notes (Signed)
He formerly was agitated; and had calmed down.  Upon the arrival of his niece he again becomes animated; complaining "I never wanted to come here".  The [employer] officer informs him that the police (who brought him here) do not take people here unless they expressly ask to come here.  He signs out AMA without incident and leaves ambulatory with his niece at this time.

## 2015-06-12 NOTE — ED Notes (Signed)
Received call from pts niece "Virl Axe" who would like to come transport pt to [facility] hospital after pt and Virl Axe had a discussion via phone. She has been made aware that pt is allowed to leave against medical advice since he has not been evaluated by our EDP. She states she is on the way and please let him know. Pt has been advised of conversation with niece and her wishes to take him to the [name].

## 2015-06-22 ENCOUNTER — Encounter (HOSPITAL_COMMUNITY): Payer: Self-pay | Admitting: Psychiatry

## 2015-08-20 DIAGNOSIS — B1920 Unspecified viral hepatitis C without hepatic coma: Secondary | ICD-10-CM | POA: Insufficient documentation

## 2015-08-20 DIAGNOSIS — F1410 Cocaine abuse, uncomplicated: Secondary | ICD-10-CM | POA: Diagnosis not present

## 2015-08-20 DIAGNOSIS — F1210 Cannabis abuse, uncomplicated: Secondary | ICD-10-CM | POA: Insufficient documentation

## 2015-08-20 DIAGNOSIS — F332 Major depressive disorder, recurrent severe without psychotic features: Secondary | ICD-10-CM | POA: Diagnosis not present

## 2015-08-20 DIAGNOSIS — F329 Major depressive disorder, single episode, unspecified: Secondary | ICD-10-CM | POA: Diagnosis not present

## 2015-08-20 DIAGNOSIS — R079 Chest pain, unspecified: Secondary | ICD-10-CM | POA: Diagnosis not present

## 2015-08-20 DIAGNOSIS — R45851 Suicidal ideations: Secondary | ICD-10-CM | POA: Diagnosis not present

## 2015-08-20 DIAGNOSIS — F1010 Alcohol abuse, uncomplicated: Secondary | ICD-10-CM | POA: Diagnosis not present

## 2015-08-23 DIAGNOSIS — F329 Major depressive disorder, single episode, unspecified: Secondary | ICD-10-CM | POA: Diagnosis not present

## 2015-08-23 DIAGNOSIS — F17210 Nicotine dependence, cigarettes, uncomplicated: Secondary | ICD-10-CM | POA: Diagnosis not present

## 2015-08-23 DIAGNOSIS — F1910 Other psychoactive substance abuse, uncomplicated: Secondary | ICD-10-CM | POA: Diagnosis not present

## 2015-08-23 DIAGNOSIS — I10 Essential (primary) hypertension: Secondary | ICD-10-CM | POA: Diagnosis not present

## 2015-08-23 DIAGNOSIS — B1920 Unspecified viral hepatitis C without hepatic coma: Secondary | ICD-10-CM | POA: Diagnosis not present

## 2015-08-23 DIAGNOSIS — F419 Anxiety disorder, unspecified: Secondary | ICD-10-CM | POA: Diagnosis not present

## 2015-08-23 DIAGNOSIS — R45851 Suicidal ideations: Secondary | ICD-10-CM | POA: Diagnosis not present

## 2015-09-25 ENCOUNTER — Emergency Department (HOSPITAL_COMMUNITY): Payer: Medicare Other

## 2015-09-25 ENCOUNTER — Emergency Department (HOSPITAL_COMMUNITY)
Admission: EM | Admit: 2015-09-25 | Discharge: 2015-09-25 | Disposition: A | Discharge status: Home / Self Care | Payer: Medicare Other | Attending: Emergency Medicine | Admitting: Emergency Medicine

## 2015-09-25 ENCOUNTER — Other Ambulatory Visit: Payer: Self-pay

## 2015-09-25 DIAGNOSIS — Z79899 Other long term (current) drug therapy: Secondary | ICD-10-CM | POA: Diagnosis not present

## 2015-09-25 DIAGNOSIS — M549 Dorsalgia, unspecified: Secondary | ICD-10-CM | POA: Diagnosis not present

## 2015-09-25 DIAGNOSIS — R400 Somnolence: Secondary | ICD-10-CM | POA: Insufficient documentation

## 2015-09-25 DIAGNOSIS — R4182 Altered mental status, unspecified: Secondary | ICD-10-CM

## 2015-09-25 DIAGNOSIS — G8929 Other chronic pain: Secondary | ICD-10-CM | POA: Insufficient documentation

## 2015-09-25 DIAGNOSIS — Z87891 Personal history of nicotine dependence: Secondary | ICD-10-CM | POA: Insufficient documentation

## 2015-09-25 DIAGNOSIS — F1490 Cocaine use, unspecified, uncomplicated: Secondary | ICD-10-CM | POA: Insufficient documentation

## 2015-09-25 DIAGNOSIS — N189 Chronic kidney disease, unspecified: Secondary | ICD-10-CM | POA: Diagnosis not present

## 2015-09-25 DIAGNOSIS — R4781 Slurred speech: Secondary | ICD-10-CM | POA: Diagnosis not present

## 2015-09-25 DIAGNOSIS — R369 Urethral discharge, unspecified: Secondary | ICD-10-CM | POA: Diagnosis not present

## 2015-09-25 DIAGNOSIS — R040 Epistaxis: Secondary | ICD-10-CM | POA: Diagnosis not present

## 2015-09-25 DIAGNOSIS — I129 Hypertensive chronic kidney disease with stage 1 through stage 4 chronic kidney disease, or unspecified chronic kidney disease: Secondary | ICD-10-CM | POA: Diagnosis not present

## 2015-09-25 DIAGNOSIS — I6789 Other cerebrovascular disease: Secondary | ICD-10-CM | POA: Diagnosis not present

## 2015-09-25 LAB — CBC WITH DIFFERENTIAL/PLATELET
Basophils Absolute: 0 10*3/uL (ref 0.0–0.1)
Basophils Relative: 0 %
Eosinophils Absolute: 0.1 10*3/uL (ref 0.0–0.7)
Eosinophils Relative: 2 %
HCT: 40.8 % (ref 39.0–52.0)
Hemoglobin: 13.5 g/dL (ref 13.0–17.0)
Lymphocytes Relative: 29 %
Lymphs Abs: 2.3 10*3/uL (ref 0.7–4.0)
MCH: 27.6 pg (ref 26.0–34.0)
MCHC: 33.1 g/dL (ref 30.0–36.0)
MCV: 83.3 fL (ref 78.0–100.0)
Monocytes Absolute: 0.6 10*3/uL (ref 0.1–1.0)
Monocytes Relative: 8 %
Neutro Abs: 4.9 10*3/uL (ref 1.7–7.7)
Neutrophils Relative %: 61 %
Platelets: 257 10*3/uL (ref 150–400)
RBC: 4.9 MIL/uL (ref 4.22–5.81)
RDW: 14.6 % (ref 11.5–15.5)
WBC: 8 10*3/uL (ref 4.0–10.5)

## 2015-09-25 LAB — COMPREHENSIVE METABOLIC PANEL
ALT: 79 U/L — ABNORMAL HIGH (ref 17–63)
AST: 64 U/L — ABNORMAL HIGH (ref 15–41)
Albumin: 4.6 g/dL (ref 3.5–5.0)
Alkaline Phosphatase: 55 U/L (ref 38–126)
Anion gap: 7 (ref 5–15)
BUN: 9 mg/dL (ref 6–20)
CO2: 25 mmol/L (ref 22–32)
Calcium: 9.5 mg/dL (ref 8.9–10.3)
Chloride: 107 mmol/L (ref 101–111)
Creatinine, Ser: 1.06 mg/dL (ref 0.61–1.24)
GFR calc Af Amer: >60 mL/min (ref >60)
GFR calc non Af Amer: >60 mL/min (ref >60)
Glucose, Bld: 103 mg/dL — ABNORMAL HIGH (ref 65–99)
Potassium: 3.6 mmol/L (ref 3.5–5.1)
Sodium: 139 mmol/L (ref 135–145)
Total Bilirubin: 0.7 mg/dL (ref 0.3–1.2)
Total Protein: 8 g/dL (ref 6.5–8.1)

## 2015-09-25 LAB — URINALYSIS, ROUTINE W REFLEX MICROSCOPIC
Bilirubin Urine: NEGATIVE
Glucose, UA: NEGATIVE mg/dL
Hgb urine dipstick: NEGATIVE
Ketones, ur: 15 mg/dL — AB
Nitrite: NEGATIVE
Protein, ur: NEGATIVE mg/dL
Specific Gravity, Urine: 1.027 (ref 1.005–1.030)
pH: 5.5 (ref 5.0–8.0)

## 2015-09-25 LAB — URINE MICROSCOPIC-ADD ON: RBC / HPF: NONE SEEN RBC/hpf (ref 0–5)

## 2015-09-25 LAB — CBG MONITORING, ED: Glucose-Capillary: 102 mg/dL — ABNORMAL HIGH (ref 65–99)

## 2015-09-25 LAB — I-STAT CG4 LACTIC ACID, ED
Lactic Acid, Venous: 0.91 mmol/L (ref 0.5–2.0)
Lactic Acid, Venous: 1.62 mmol/L (ref 0.5–2.0)

## 2015-09-25 LAB — RAPID URINE DRUG SCREEN, HOSP PERFORMED
Amphetamines: NOT DETECTED
Barbiturates: NOT DETECTED
Benzodiazepines: NOT DETECTED
Cocaine: POSITIVE — AB
Opiates: POSITIVE — AB
Tetrahydrocannabinol: POSITIVE — AB

## 2015-09-25 LAB — I-STAT TROPONIN, ED: Troponin i, poc: 0 ng/mL (ref 0.00–0.08)

## 2015-09-25 MED ORDER — SODIUM CHLORIDE 0.9 % IV BOLUS (SEPSIS)
1000.0000 mL | Freq: Once | INTRAVENOUS | Status: AC
  Administered 2015-09-25: 1000 mL via INTRAVENOUS

## 2015-09-25 MED ORDER — AZITHROMYCIN 1 G PO PACK: 1.0000 g | PACK | Freq: Once | ORAL | Status: DC

## 2015-09-25 MED ORDER — CEFTRIAXONE SODIUM 250 MG IJ SOLR
250.0000 mg | Freq: Once | INTRAMUSCULAR | Status: AC
  Administered 2015-09-25: 250 mg via INTRAMUSCULAR
  Filled 2015-09-25: qty 250

## 2015-09-25 MED ORDER — LIDOCAINE HCL (PF) 1 % IJ SOLN
INTRAMUSCULAR | Status: AC
  Administered 2015-09-25: 0.9 mL via INTRAMUSCULAR
  Filled 2015-09-25: qty 5

## 2015-09-25 MED ORDER — AZITHROMYCIN 250 MG PO TABS
1000.0000 mg | ORAL_TABLET | Freq: Once | ORAL | Status: AC
  Administered 2015-09-25: 1000 mg via ORAL
  Filled 2015-09-25: qty 4

## 2015-09-25 MED ORDER — CEFTRIAXONE SODIUM 250 MG IJ SOLR: 250.0000 mg | Freq: Once | INTRAMUSCULAR | Status: DC

## 2015-09-25 MED ORDER — CEPHALEXIN 500 MG PO CAPS: 1000.0000 mg | ORAL_CAPSULE | Freq: Two times a day (BID) | ORAL | Status: DC

## 2015-09-25 NOTE — ED Notes (Signed)
PER EMS: pt from [facility] and EMS was called by bystanders due to a nosebleed. Upon arrival, he was found to be altered, A&O to self and place only; baseline unknown. Pt very drowsy and falls asleep when talking to him. BP- 132/84, HR-70, 94%, 102. 18g LAC. No bleeding from nares upon arrival.

## 2015-09-25 NOTE — ED Notes (Signed)
Patient transported to CT 

## 2015-09-25 NOTE — ED Notes (Signed)
Attempted to obtain a urine specimine; pt unable to provide one at this time

## 2015-09-25 NOTE — ED Notes (Signed)
Pt again urged to provide urine. Pt vigorously refuses to be cathed but falls asleep when tries to void to urinal.

## 2015-09-25 NOTE — ED Notes (Signed)
MD at bedside. 

## 2015-09-25 NOTE — ED Provider Notes (Signed)
CSN: [id number]     Arrival date & time 09/25/15  1310 History   First MD Initiated Contact with Patient 09/25/15 1326     Chief Complaint  Patient presents with  . Altered Mental Status     (Consider location/radiation/quality/duration/timing/severity/associated sxs/prior Treatment) HPI    55 year old male with history of bipolar, hepatitis C, substance abuse, recently left a rehabilitation facility to go to a funeral, presents with concern for altered mental status. EMS was called by bystanders due to concern of nosebleed, and patient was found to be very drowsy on EMS arrival.  Patient is sleepy, however awakens appropriately, is oriented, and initially denies any drug or alcohol use. Reports he just "does not feel well" however cannot be more specific.  On reevaluation, patient does report that he smoked crack, and that is why he is in the emergency department. Patient reports he feels guilty regarding this relapse. Denies any suicidal ideation.  Reports he is not sure when he did this, that he has lost track of time.  Reports testicular pain bilateral x1 month and penile discharge "for a long time." Reports he always gets UTIs. Is sexually active with women. Family currently in town. Was in rehab for 21 days prior to signing out to go to funeral.   Past Medical History  Diagnosis Date  . Arthritis     per pt rheumatoid   . Anxiety   . Bipolar affective disorder (HCC)   . Depression   . Alcohol abuse   . Cocaine abuse     smokes crack-cocaine  . Chronic back pain   . Tobacco abuse   . Vitamin D deficiency   . Gout   . Hemorrhoids   . Hypertension   . GERD (gastroesophageal reflux disease)   . Arthritis   . Bipolar 1 disorder (HCC)   . Hepatitis C   . Back pain, chronic   . Chronic kidney disease    Past Surgical History  Procedure Laterality Date  . Knee surgery     Family History  Problem Relation Age of Onset  . Hypertension Brother   . Diabetes Brother   .  Hypertension Mother   . Diabetes Mother   . Arthritis Mother   . Other Sister     bone disease   Social History  Substance Use Topics  . Smoking status: Former Games developer  . Smokeless tobacco: Not on file  . Alcohol Use: Yes     Comment: 24 oz--3x's wkly     Review of Systems  Constitutional: Negative for fever.  HENT: Negative for sore throat.   Eyes: Negative for visual disturbance.  Respiratory: Negative for shortness of breath.   Cardiovascular: Negative for chest pain.  Gastrointestinal: Negative for nausea, vomiting, abdominal pain, diarrhea and constipation.  Genitourinary: Positive for dysuria and testicular pain (1 month). Negative for difficulty urinating.  Musculoskeletal: Positive for back pain (chronic). Negative for neck stiffness.  Skin: Negative for rash.  Neurological: Negative for syncope and headaches.      Allergies  Review of patient's allergies indicates no known allergies.  Home Medications   Prior to Admission medications   Medication Sig Start Date End Date Taking? Authorizing Provider  ARIPiprazole (ABILIFY) 10 MG tablet Take 1 tablet (10 mg total) by mouth daily. 02/01/15   Beau Fanny, FNP  gabapentin (NEURONTIN) 300 MG capsule Take 1 capsule (300 mg total) by mouth 3 (three) times daily. 02/01/15   Beau Fanny, FNP  hydrOXYzine (ATARAX/VISTARIL) 25  MG tablet Take 1 tablet (25 mg total) by mouth 3 (three) times daily as needed for anxiety. 02/01/15   Beau Fanny, FNP  lithium carbonate 150 MG capsule Take 3 capsules (450 mg total) by mouth 2 (two) times daily with a meal. 02/01/15   Beau Fanny, FNP  methocarbamol (ROBAXIN) 750 MG tablet Take 1 tablet (750 mg total) by mouth every 8 (eight) hours as needed for muscle spasms. Patient not taking: Reported on 01/28/2015 04/06/14   Thermon Leyland, NP  ondansetron (ZOFRAN ODT) 4 MG disintegrating tablet Take 1 tablet (4 mg total) by mouth every 8 (eight) hours as needed for nausea or  vomiting. Patient not taking: Reported on 01/28/2015 12/17/14   Teressa Lower, NP  pantoprazole (PROTONIX) 40 MG tablet Take 1 tablet (40 mg total) by mouth daily. 02/01/15   Beau Fanny, FNP  tamsulosin (FLOMAX) 0.4 MG CAPS capsule Take 1 capsule (0.4 mg total) by mouth daily. 02/01/15   Beau Fanny, FNP  traZODone (DESYREL) 100 MG tablet Take 1 tablet (100 mg total) by mouth at bedtime as needed for sleep. 02/01/15   Everardo All Withrow, FNP   BP 109/63 mmHg  Pulse 63  Temp(Src) 96.8 F (36 C) (Rectal)  Resp 25  SpO2 96% Physical Exam  Constitutional: He is oriented to person, place, and time. He appears well-developed and well-nourished. No distress.  HENT:  Head: Normocephalic and atraumatic.  Charcoal mucous present on tongue (from crack pipe)  Eyes: Conjunctivae and EOM are normal.  Neck: Normal range of motion.  Cardiovascular: Normal rate, regular rhythm, normal heart sounds and intact distal pulses.  Exam reveals no gallop and no friction rub.   No murmur heard. Pulmonary/Chest: Effort normal and breath sounds normal. No respiratory distress. He has no wheezes. He has no rales.  Abdominal: Soft. He exhibits no distension. There is no tenderness. There is no guarding.  Musculoskeletal: He exhibits no edema.  Neurological: He is alert and oriented to person, place, and time. He has normal strength. No cranial nerve deficit or sensory deficit. Coordination normal. GCS eye subscore is 4. GCS verbal subscore is 5. GCS motor subscore is 6.  Sleepy, however is able to wake up, follow commands, oriented, answer questions  Skin: Skin is warm and dry. He is not diaphoretic.  Nursing note and vitals reviewed.   ED Course  Procedures (including critical care time) Labs Review Labs Reviewed  COMPREHENSIVE METABOLIC PANEL - Abnormal; Notable for the following:    Glucose, Bld 103 (*)    AST 64 (*)    ALT 79 (*)    All other components within normal limits  CBG MONITORING, ED -  Abnormal; Notable for the following:    Glucose-Capillary 102 (*)    All other components within normal limits  CBC WITH DIFFERENTIAL/PLATELET  URINALYSIS, ROUTINE W REFLEX MICROSCOPIC (NOT AT [facility])  URINE RAPID DRUG SCREEN, HOSP PERFORMED  I-STAT CG4 LACTIC ACID, ED  I-STAT TROPOININ, ED  I-STAT CG4 LACTIC ACID, ED  GC/CHLAMYDIA PROBE AMP ([place]) NOT AT [facility]    Imaging Review Ct Head Wo Contrast  09/25/2015  CLINICAL DATA:  Patient with nose bleed. Altered mental status. Drowsy. EXAM: CT HEAD WITHOUT CONTRAST TECHNIQUE: Contiguous axial images were obtained from the base of the skull through the vertex without intravenous contrast. COMPARISON:  PET-CT 01/27/2015. FINDINGS: Ventricles and sulci are appropriate for patient's age. No evidence for acute cortically based infarct, intracranial hemorrhage, mass lesion or mass-effect.  Orbits are unremarkable. Paranasal sinuses are well aerated. Calvarium is intact. IMPRESSION: No acute intracranial process. Electronically Signed   By: Annia Belt M.D.   On: 09/25/2015 15:28   Dg Chest Port 1 View  09/25/2015  CLINICAL DATA:  Altered mental status. EXAM: PORTABLE CHEST 1 VIEW COMPARISON:  Radiograph of December 16, 2014. FINDINGS: The heart size and mediastinal contours are within normal limits. Both lungs are clear. No pneumothorax or pleural effusion is noted. The visualized skeletal structures are unremarkable. IMPRESSION: No acute cardiopulmonary abnormality seen. Electronically Signed   By: Lupita Raider, M.D.   On: 09/25/2015 15:18   I have personally reviewed and evaluated these images and lab results as part of my medical decision-making.   EKG Interpretation None      MDM   Final diagnoses:  Cocaine use  Back pain, chronic  Penile discharge  Somnolence   55 year old male with history of bipolar, hepatitis C, substance abuse, recently left a rehabilitation facility to go to a funeral, presents with concern for altered mental  status. EMS was called by bystanders due to concern of nosebleed, and patient was found to be very drowsy on EMS arrival.  Patient is sleepy, however awakens appropriately, is oriented, and initially denies any drug or alcohol use. On reevaluation, patient does report that he smoked crack, and that is why he is in the emergency department. Patient reports he feels guilty regarding this relapse. Denies any suicidal ideation. Prior to patient admitting to drug use, and lab work including head CT which showed no intracranial abnormality, chest x-ray which showed no sign of pneumonia, CBC, CMP which showed chronic mild elevation in his transaminases.  Given pt now reports crack cocaine use, feel drug use is likely etiology of his presentation (cocaine and or others) and do not feel further evaluation with etoh/acetaminophen/salicylates are indicated.  On reevaluation reports testicular pain for one month. Patient with normal exam, nontender, doubt torsion/epididymitis. Denies concern for STIs, however reports chronic UTIs and penile discharge.  Will send GC/Chl and treat empirically with rocephin/azithromycin.  Awaiting urinalysis.  Patient GCS 15 and now can provide history, however continues to be sleepy, not back to baseline, and will continue to monitor. Care transferred to Dr. Donnald Garre.  Alvira Monday, MD 09/25/15 [phone]

## 2015-09-25 NOTE — ED Notes (Signed)
CBG 102 

## 2015-09-25 NOTE — ED Notes (Signed)
Pt returnsx from ct

## 2015-09-25 NOTE — ED Provider Notes (Signed)
Patient is accepted at signout from Dr. Dalene Seltzer. Patient reportedly was very confused on arrival to the emergency department. Initially etiology was unclear. Patient subsequently volunteered that he had smoked crack cocaine and that was the problem. Plan was to observe the patient until his mental status was clear and he was back to baseline function. Also plan to review the patient's urine specimen.  I reassessed the patient at 19:00. He reports he still doesn't feel well. His speech is clear and situationally appropriate. He reports before he smoked the crack he was at his baseline state of health. He denies he was having illness or neurologic dysfunction prior to that. He reports now he hurts rather all over. He still feels very badly. GCS is 15. Each content normal. Vital signs stable. Airway intact. Will continue to observe.  22:30 patient is awake and alert. He has eaten a meal tray. Vital signs remain stable. At this time he is safe for discharge. Patient is advised of resource guide for drug and alcohol and psychiatric counseling. The patient reports he has a hotel room rented for 2 weeks. He will return there and use the resource guide for healthcare and addiction therapy.  Arby Barrette, MD 09/25/15 [phone]

## 2015-09-25 NOTE — Discharge Instructions (Signed)
Opioid Use Disorder °Opioid use disorder is a mental disorder. It is the continued nonmedical use of opioids in spite of risks to health and well-being. Misused opioids include the street drug heroin. They also include pain medicines such as morphine, hydrocodone, oxycodone, and fentanyl. Opioids are very addictive. People who misuse opioids get an exaggerated feeling of well-being. Opioid use disorder often disrupts activities at home, work, or school. It may cause mental or physical problems.  °A family history of opioid use disorder puts you at higher risk of it. People with opioid use disorder often misuse other drugs or have mental illness such as depression, posttraumatic stress disorder, or antisocial personality disorder. They also are at risk of suicide and death from overdose. °SIGNS AND SYMPTOMS  °Signs and symptoms of opioid use disorder include: °· Use of opioids in larger amounts or over a longer period than intended. °· Unsuccessful attempts to cut down or control opioid use. °· A lot of time spent obtaining, using, or recovering from the effects of opioids. °· A strong desire or urge to use opioids (craving). °· Continued use of opioids in spite of major problems at work, school, or home because of use. °· Continued use of opioids in spite of relationship problems because of use. °· Giving up or cutting down on important life activities because of opioid use. °· Use of opioids over and over in situations when it is physically hazardous, such as driving a car. °· Continued use of opioids in spite of a physical problem that is likely related to use. Physical problems can include: °· Severe constipation. °· Poor nutrition. °· Infertility. °· Tuberculosis. °· Aspiration pneumonia. °· Infections such as human immunodeficiency virus (HIV) and hepatitis (from injecting opioids). °· Continued use of opioids in spite of a mental problem that is likely related to use. Mental problems can  include: °· Depression. °· Anxiety. °· Hallucinations. °· Sleep problems. °· Loss of sexual function. °· Need to use more and more opioids to get the same effect, or lessened effect over time with use of the same amount (tolerance). °· Having withdrawal symptoms when opioid use is stopped, or using opioids to reduce or avoid withdrawal symptoms. Withdrawal symptoms include: °· Depressed, anxious, or irritable mood. °· Nausea, vomiting, diarrhea, or intestinal cramping. °· Muscle aches or spasms. °· Excessive tearing or runny nose. °· Dilated pupils, sweating, or hairs standing on end. °· Yawning. °· Fever, raised blood pressure, or fast pulse. °· Restlessness or trouble sleeping. This does not apply to people taking opioids for medical reasons only. °DIAGNOSIS °Opioid use disorder is diagnosed by your health care provider. You may be asked questions about your opioid use and and how it affects your life. A physical exam may be done. A drug screen may be ordered. You may be referred to a mental health professional. The diagnosis of opioid use disorder requires at least two symptoms within 12 months. The type of opioid use disorder you have depends on the number of signs and symptoms you have. The type may be: °· Mild. Two or three signs and symptoms.    °· Moderate. Four or five signs and symptoms.   °· Severe. Six or more signs and symptoms. °TREATMENT  °Treatment is usually provided by mental health professionals with training in substance use disorders. The following options are available: °· Detoxification. This is the first step in treatment for withdrawal. It is medically supervised withdrawal with the use of medicines. These medicines lessen withdrawal symptoms. They also raise the chance   of becoming opioid free.  Counseling, also known as talk therapy. Talk therapy addresses the reasons you use opioids. It also addresses ways to keep you from using again (relapse). The goals of talk therapy are to avoid  relapse by:  Identifying and avoiding triggers for use.  Finding healthy ways to cope with stress.  Learning how to handle cravings.  Support groups. Support groups provide emotional support, advice, and guidance.  A medicine that blocks opioid receptors in your brain. This medicine can reduce opioid cravings that lead to relapse. This medicine also blocks the desired opioid effect when relapse occurs.  Opioids that are taken by mouth in place of the misused opioid (opioid maintenance treatment). These medicines satisfy cravings but are safer than commonly misused opioids. This often is the best option for people who continue to relapse with other treatments. HOME CARE INSTRUCTIONS   Take medicines only as directed by your health care provider.  Check with your health care provider before starting new medicines.  Keep all follow-up visits as directed by your health care provider. SEEK MEDICAL CARE IF:  You are not able to take your medicines as directed.  Your symptoms get worse. SEEK IMMEDIATE MEDICAL CARE IF:  You have serious thoughts about hurting yourself or others.  You may have taken an overdose of opioids. FOR MORE INFORMATION  National Institute on Drug Abuse: http://www.price-smith.com/  Substance Abuse and Mental Health Services Administration: SkateOasis.com.pt   This information is not intended to replace advice given to you by your health care provider. Make sure you discuss any questions you have with your health care provider.   Document Released: 01/22/2007 Document Revised: 04/17/2014 Document Reviewed: 04/09/2013 Elsevier Interactive Patient Education 2016 Elsevier Inc. Stimulant Use Disorder-Cocaine Cocaine is one of a group of powerful drugs called stimulants. Cocaine has medical uses for stopping nosebleeds and for pain control before minor nose or dental surgery. However, cocaine is misused because of the effects that it produces. These effects include:   A  feeling of extreme pleasure.  Alertness.  High energy. Common street names for cocaine include coke, crack, blow, snow, and nose candy. Cocaine is snorted, dissolved in water and injected, or smoked.  Stimulants are addictive because they activate regions of the brain that produce both the pleasurable sensation of "reward" and psychological dependence. Together, these actions account for loss of control and the rapid development of drug dependence. This means you become ill without the drug (withdrawal) and need to keep using it to function.  Stimulant use disorder is use of stimulants that disrupts your daily life. It disrupts relationships with family and friends and how you do your job. Cocaine increases your blood pressure and heart rate. It can cause a heart attack or stroke. Cocaine can also cause death from irregular heart rate or seizures. SYMPTOMS Symptoms of stimulant use disorder with cocaine include:  Use of cocaine in larger amounts or over a longer period of time than intended.  Unsuccessful attempts to cut down or control cocaine use.  A lot of time spent obtaining, using, or recovering from the effects of cocaine.  A strong desire or urge to use cocaine (craving).  Continued use of cocaine in spite of major problems at work, school, or home because of use.  Continued use of cocaine in spite of relationship problems because of use.  Giving up or cutting down on important life activities because of cocaine use.  Use of cocaine over and over in situations when it  is physically hazardous, such as driving a car.  Continued use of cocaine in spite of a physical problem that is likely related to use. Physical problems can include:  Malnutrition.  Nosebleeds.  Chest pain.  High blood pressure.  A hole that develops between the part of your nose that separates your nostrils (perforated nasal septum).  Lung and kidney damage.  Continued use of cocaine in spite of a  mental problem that is likely related to use. Mental problems can include:  Schizophrenia-like symptoms.  Depression.  Bipolar mood swings.  Anxiety.  Sleep problems.  Need to use more and more cocaine to get the same effect, or lessened effect over time with use of the same amount of cocaine (tolerance).  Having withdrawal symptoms when cocaine use is stopped, or using cocaine to reduce or avoid withdrawal symptoms. Withdrawal symptoms include:  Depressed or irritable mood.  Low energy or restlessness.  Bad dreams.  Poor or excessive sleep.  Increased appetite. DIAGNOSIS Stimulant use disorder is diagnosed by your health care provider. You may be asked questions about your cocaine use and how it affects your life. A physical exam may be done. A drug screen may be ordered. You may be referred to a mental health professional. The diagnosis of stimulant use disorder requires at least two symptoms within 12 months. The type of stimulant use disorder depends on the number of signs and symptoms you have. The type may be:  Mild. Two or three signs and symptoms.  Moderate. Four or five signs and symptoms.  Severe. Six or more signs and symptoms. TREATMENT Treatment for stimulant use disorder is usually provided by mental health professionals with training in substance use disorders. The following options are available:  Counseling or talk therapy. Talk therapy addresses the reasons you use cocaine and ways to keep you from using again. Goals of talk therapy include:  Identifying and avoiding triggers for use.  Handling cravings.  Replacing use with healthy activities.  Support groups. Support groups provide emotional support, advice, and guidance.  Medicine. Certain medicines may decrease cocaine cravings or withdrawal symptoms. HOME CARE INSTRUCTIONS  Take medicines only as directed by your health care provider.  Identify the people and activities that trigger your  cocaine use and avoid them.  Keep all follow-up visits as directed by your health care provider. SEEK MEDICAL CARE IF:  Your symptoms get worse or you relapse.  You are not able to take medicines as directed. SEEK IMMEDIATE MEDICAL CARE IF:  You have serious thoughts about hurting yourself or others.  You have a seizure, chest pain, sudden weakness, or loss of speech or vision. FOR MORE INFORMATION  National Institute on Drug Abuse: http://www.price-smith.com/  Substance Abuse and Mental Health Services Administration: SkateOasis.com.pt   This information is not intended to replace advice given to you by your health care provider. Make sure you discuss any questions you have with your health care provider.   Document Released: 03/24/2000 Document Revised: 04/17/2014 Document Reviewed: 04/09/2013 Elsevier Interactive Patient Education 2016 Elsevier Inc. Urinary Tract Infection Urinary tract infections (UTIs) can develop anywhere along your urinary tract. Your urinary tract is your body's drainage system for removing wastes and extra water. Your urinary tract includes two kidneys, two ureters, a bladder, and a urethra. Your kidneys are a pair of bean-shaped organs. Each kidney is about the size of your fist. They are located below your ribs, one on each side of your spine. CAUSES Infections are caused by microbes, which  are microscopic organisms, including fungi, viruses, and bacteria. These organisms are so small that they can only be seen through a microscope. Bacteria are the microbes that most commonly cause UTIs. SYMPTOMS  Symptoms of UTIs may vary by age and gender of the patient and by the location of the infection. Symptoms in young women typically include a frequent and intense urge to urinate and a painful, burning feeling in the bladder or urethra during urination. Older women and men are more likely to be tired, shaky, and weak and have muscle aches and abdominal pain. A fever may mean the  infection is in your kidneys. Other symptoms of a kidney infection include pain in your back or sides below the ribs, nausea, and vomiting. DIAGNOSIS To diagnose a UTI, your caregiver will ask you about your symptoms. Your caregiver will also ask you to provide a urine sample. The urine sample will be tested for bacteria and white blood cells. White blood cells are made by your body to help fight infection. TREATMENT  Typically, UTIs can be treated with medication. Because most UTIs are caused by a bacterial infection, they usually can be treated with the use of antibiotics. The choice of antibiotic and length of treatment depend on your symptoms and the type of bacteria causing your infection. HOME CARE INSTRUCTIONS  If you were prescribed antibiotics, take them exactly as your caregiver instructs you. Finish the medication even if you feel better after you have only taken some of the medication.  Drink enough water and fluids to keep your urine clear or pale yellow.  Avoid caffeine, tea, and carbonated beverages. They tend to irritate your bladder.  Empty your bladder often. Avoid holding urine for long periods of time.  Empty your bladder before and after sexual intercourse.  After a bowel movement, women should cleanse from front to back. Use each tissue only once. SEEK MEDICAL CARE IF:   You have back pain.  You develop a fever.  Your symptoms do not begin to resolve within 3 days. SEEK IMMEDIATE MEDICAL CARE IF:   You have severe back pain or lower abdominal pain.  You develop chills.  You have nausea or vomiting.  You have continued burning or discomfort with urination. MAKE SURE YOU:   Understand these instructions.  Will watch your condition.  Will get help right away if you are not doing well or get worse.   This information is not intended to replace advice given to you by your health care provider. Make sure you discuss any questions you have with your health  care provider.   Document Released: 01/04/2005 Document Revised: 12/16/2014 Document Reviewed: 05/05/2011 Elsevier Interactive Patient Education 2016 Elsevier Inc. Possible Sexually Transmitted Disease You have been treated for gonorrhea and chlamydia. Do not have sexual activity until your test results have returned and any partners have been treated and notified. A sexually transmitted disease (STD) is a disease or infection that may be passed (transmitted) from person to person, usually during sexual activity. This may happen by way of saliva, semen, blood, vaginal mucus, or urine. Common STDs include:  Gonorrhea.  Chlamydia.  Syphilis.  HIV and AIDS.  Genital herpes.  Hepatitis B and C.  Trichomonas.  Human papillomavirus (HPV).  Pubic lice.  Scabies.  Mites.  Bacterial vaginosis. WHAT ARE CAUSES OF STDs? An STD may be caused by bacteria, a virus, or parasites. STDs are often transmitted during sexual activity if one person is infected. However, they may also be transmitted  through nonsexual means. STDs may be transmitted after:   Sexual intercourse with an infected person.  Sharing sex toys with an infected person.  Sharing needles with an infected person or using unclean piercing or tattoo needles.  Having intimate contact with the genitals, mouth, or rectal areas of an infected person.  Exposure to infected fluids during birth. WHAT ARE THE SIGNS AND SYMPTOMS OF STDs? Different STDs have different symptoms. Some people may not have any symptoms. If symptoms are present, they may include:  Painful or bloody urination.  Pain in the pelvis, abdomen, vagina, anus, throat, or eyes.  A skin rash, itching, or irritation.  Growths, ulcerations, blisters, or sores in the genital and anal areas.  Abnormal vaginal discharge with or without bad odor.  Penile discharge in men.  Fever.  Pain or bleeding during sexual intercourse.  Swollen glands in the groin  area.  Yellow skin and eyes (jaundice). This is seen with hepatitis.  Swollen testicles.  Infertility.  Sores and blisters in the mouth. HOW ARE STDs DIAGNOSED? To make a diagnosis, your health care provider may:  Take a medical history.  Perform a physical exam.  Take a sample of any discharge to examine.  Swab the throat, cervix, opening to the penis, rectum, or vagina for testing.  Test a sample of your first morning urine.  Perform blood tests.  Perform a Pap test, if this applies.  Perform a colposcopy.  Perform a laparoscopy. HOW ARE STDs TREATED? Treatment depends on the STD. Some STDs may be treated but not cured.  Chlamydia, gonorrhea, trichomonas, and syphilis can be cured with antibiotic medicine.  Genital herpes, hepatitis, and HIV can be treated, but not cured, with prescribed medicines. The medicines lessen symptoms.  Genital warts from HPV can be treated with medicine or by freezing, burning (electrocautery), or surgery. Warts may come back.  HPV cannot be cured with medicine or surgery. However, abnormal areas may be removed from the cervix, vagina, or vulva.  If your diagnosis is confirmed, your recent sexual partners need treatment. This is true even if they are symptom-free or have a negative culture or evaluation. They should not have sex until their health care providers say it is okay.  Your health care provider may test you for infection again 3 months after treatment. HOW CAN I REDUCE MY RISK OF GETTING AN STD? Take these steps to reduce your risk of getting an STD:  Use latex condoms, dental dams, and water-soluble lubricants during sexual activity. Do not use petroleum jelly or oils.  Avoid having multiple sex partners.  Do not have sex with someone who has other sex partners  Do not have sex with anyone you do not know or who is at high risk for an STD.  Avoid risky sex practices that can break your skin.  Do not have sex if you have  open sores on your mouth or skin.  Avoid drinking too much alcohol or taking illegal drugs. Alcohol and drugs can affect your judgment and put you in a vulnerable position.  Avoid engaging in oral and anal sex acts.  Get vaccinated for HPV and hepatitis. If you have not received these vaccines in the past, talk to your health care provider about whether one or both might be right for you.  If you are at risk of being infected with HIV, it is recommended that you take a prescription medicine daily to prevent HIV infection. This is called pre-exposure prophylaxis (PrEP). You are  considered at risk if:  You are a man who has sex with other men (MSM).  You are a heterosexual man or woman and are sexually active with more than one partner.  You take drugs by injection.  You are sexually active with a partner who has HIV.  Talk with your health care provider about whether you are at high risk of being infected with HIV. If you choose to begin PrEP, you should first be tested for HIV. You should then be tested every 3 months for as long as you are taking PrEP. WHAT SHOULD I DO IF I THINK I HAVE AN STD?  See your health care provider.  Tell your sexual partner(s). They should be tested and treated for any STDs.  Do not have sex until your health care provider says it is okay. WHEN SHOULD I GET IMMEDIATE MEDICAL CARE? Contact your health care provider right away if:   You have severe abdominal pain.  You are a man and notice swelling or pain in your testicles.  You are a woman and notice swelling or pain in your vagina.   This information is not intended to replace advice given to you by your health care provider. Make sure you discuss any questions you have with your health care provider.   Document Released: 06/17/2002 Document Revised: 04/17/2014 Document Reviewed: 10/15/2012 Elsevier Interactive Patient Education 2016 Elsevier Inc. Urinary Tract Infection Urinary tract infections  (UTIs) can develop anywhere along your urinary tract. Your urinary tract is your body's drainage system for removing wastes and extra water. Your urinary tract includes two kidneys, two ureters, a bladder, and a urethra. Your kidneys are a pair of bean-shaped organs. Each kidney is about the size of your fist. They are located below your ribs, one on each side of your spine. CAUSES Infections are caused by microbes, which are microscopic organisms, including fungi, viruses, and bacteria. These organisms are so small that they can only be seen through a microscope. Bacteria are the microbes that most commonly cause UTIs. SYMPTOMS  Symptoms of UTIs may vary by age and gender of the patient and by the location of the infection. Symptoms in young women typically include a frequent and intense urge to urinate and a painful, burning feeling in the bladder or urethra during urination. Older women and men are more likely to be tired, shaky, and weak and have muscle aches and abdominal pain. A fever may mean the infection is in your kidneys. Other symptoms of a kidney infection include pain in your back or sides below the ribs, nausea, and vomiting. DIAGNOSIS To diagnose a UTI, your caregiver will ask you about your symptoms. Your caregiver will also ask you to provide a urine sample. The urine sample will be tested for bacteria and white blood cells. White blood cells are made by your body to help fight infection. TREATMENT  Typically, UTIs can be treated with medication. Because most UTIs are caused by a bacterial infection, they usually can be treated with the use of antibiotics. The choice of antibiotic and length of treatment depend on your symptoms and the type of bacteria causing your infection. HOME CARE INSTRUCTIONS  If you were prescribed antibiotics, take them exactly as your caregiver instructs you. Finish the medication even if you feel better after you have only taken some of the medication.  Drink  enough water and fluids to keep your urine clear or pale yellow.  Avoid caffeine, tea, and carbonated beverages. They tend to  irritate your bladder.  Empty your bladder often. Avoid holding urine for long periods of time.  Empty your bladder before and after sexual intercourse.  After a bowel movement, women should cleanse from front to back. Use each tissue only once. SEEK MEDICAL CARE IF:   You have back pain.  You develop a fever.  Your symptoms do not begin to resolve within 3 days. SEEK IMMEDIATE MEDICAL CARE IF:   You have severe back pain or lower abdominal pain.  You develop chills.  You have nausea or vomiting.  You have continued burning or discomfort with urination. MAKE SURE YOU:   Understand these instructions.  Will watch your condition.  Will get help right away if you are not doing well or get worse.   This information is not intended to replace advice given to you by your health care provider. Make sure you discuss any questions you have with your health care provider.   Document Released: 01/04/2005 Document Revised: 12/16/2014 Document Reviewed: 05/05/2011 Elsevier Interactive Patient Education 2016 [employer]. [employer] Guide Outpatient Counseling/Substance Abuse Adult [employer] 211 is a great source of information about community services available.  Access by dialing 2-1-1 from anywhere in [place], or by website -  PooledIncome.pl.   Other Local Resources (Updated 04/2015)  Crisis Hotlines   Services     Area Served  [employer]  Crisis Hotline, available 24 hours a day, 7 days a week: [phone] [facility], [place]   [facility]  Crisis Hotline, available 24 hours a day, 7 days a week: [phone] [facility], [place]  [facility]  Suicide Prevention Hotline, available 24 hours a day, 7 days a week: 541-486-3147 [facility], [place]  [employer], available  24 hours a day, 7 days a week: 6781079687 [facility], [place]   [facility] Access to [employer], available 24 hours a day, 7 days a week: [phone] All   [facility]  Crisis Hotline, available 24 hours a day, 7 days a week: 586-426-6054 All   Other Local Resources (Updated 04/2015)  Outpatient Counseling/ Substance Abuse Programs  Services     Address and Phone Number  [facility])   Options include Individual counseling, group counseling, intensive outpatient program (several hours a day, several days a week)  Offers depression assessments  Provides methadone maintenance program [phone] [address], [address] [place], [place] [address]   [facility]   Offers partial hospitalization/day treatment and DUI/DWI programs  [employer], private insurance [phone] [address] [place], [place] [address]  [facility]    Services include intensive outpatient program (several hours a day, several days a week), outpatient treatment, DUI/DWI services, family education  Also has some services specifically for [employer] transitional housing  [phone] [address] [place], [place] [address]     [facility]  [employer], private pay, and private insurance [phone] [address] [place], [place] [address]  [employer] [facility]  Services include individual counseling, substance abuse intensive outpatient program (several hours a day, several days a week), day treatment  Delene Loll, Medicaid, private insurance [phone] [address] [place], [place] [address]  [facility]   Offers substance abuse intensive outpatient program (several hours a day, several days a week), partial hospitalization program [phone] [address] [place], [place]  [address]  [phone] [address] [place], [place] [address]  [phone] [address] [place], [place] [address]  [phone], [address] [place], [place] [address]  [facility]  Individual counseling only  Accepts private insurance only [phone] [address] [place], [place] [address]  [facility]  Methadone maintenance program [phone] [address] [place], [place] [address]  [facility]  Walk-In Clinic providing substance abuse and mental health counseling  Accepts Medicaid, Medicare, private insurance  Offers sliding scale for uninsured [phone] [address] [place], [place]   [facility] [place], [employer].  Offers individual counseling, and intensive in-home services [phone] [address] [place], [place] [address]  [facility] individual counseling, family counseling, group therapy, domestic violence counseling, consumer credit counseling  Accepts Medicare, Medicaid, private insurance  Offers sliding scale for uninsured [phone] [address] [place], [place] [address]  [phone] [facility], [address] [place], [place] [address]  [facility]  Offers individual, family and group counseling  3 locations - [place], [place], and [place]  [phone]  [address] [place], [place] [address]  [address] [place], [place] [address]  [address] [place], [place] [address]  [facility] Marg[facility]    Offers psychiatric assessment, 8-week Intensive Outpatient Program (several hours a day, several times a week, daytime or evenings), early recovery group, family Program, medication management  Private pay or private insurance only [phone], or  [phone] [address] [place], [place] [address]  [place] [employer] individual, couples and family counseling  Accepts Medicaid, private insurance, and  sliding scale for uninsured [phone] [address] [place], [place] [address]  Len Blalock, MD  Individual counseling  Private insurance [phone] [address] [place], [place] [address]  [facility]   Offers assessment, substance abuse treatment, and behavioral health treatment [phone] [address] [place], [place] [address]  [facility]  Individual counseling  Accepts private insurance [phone] [address] [place], [place] [address]  [facility]  Individual counseling  Delene Loll, private insurance [phone] [address] [place], [place] [address]  [facility]    Offers intensive outpatient program (several hours a day, several times a week)  Private pay, private insurance [phone] [facility] [place], [place]  [facility]  Individual counseling  Medicare, private insurance [phone] [address] [place], [place] [address]  [facility]    Offers intensive outpatient program (several hours a day, several times a week) and partial hospitalization program [phone] [address] [place], [place] [address]  Emerson Monte, MD  Individual counseling [phone] [address] [place], [place] [address]  [facility]  Offers Christian counseling to individuals, couples, and families  Accepts Medicare and private insurance; offers sliding scale for uninsured [phone] [address] [place], [place] [address]  [facility]  [place] counseling [phone] [address] [place], [place] [address]  [facility] crisis counseling, individual counseling, group therapy, in-home therapy, domestic violence services, day treatment, DWI services, Administrator, arts (CST), Assertive Community  Treatment Team (ACTT), substance abuse Intensive Outpatient Program (several hours a day, several times a week)  2 locations - [place] and [place] [phone] [address] [place], [place] [address]  [phone] [address] [place], [place] [address]  [facility]  Individual counseling and group therapy  Accepts private insurance, [place], [place] [phone] [address] [place], [place]  [facility]  Offers individual and family counseling  Offers LGBTQ services  Accepts private insurance and private pay [phone] [address] [place], [place] [address]  [facility]    Offers individual counseling, group therapy, and outpatient detox  Accepts private insurance [phone] [address] [place], [place]  [facility]  Individual counseling  Accepts Medicare, private insurance [phone] [address] [place], [place] [address]  [employer]  Individual counseling  Accepts Medicare, private insurance [phone] [address] [place], [place] [address]  [facility]   Offers substance abuse Intensive Outpatient Program (several hours a day, several times a week) [phone]) [phone], or [phone] [place], [place]  [employer] Guide Inpatient Behavioral Health/Residential  Substance Abuse Treatment Adults [employer] 211 is a great source of information about community services available.  Access by dialing 2-1-1 from anywhere in [place], or by website -  PooledIncome.pl.   (Updated 04/2015)  Crisis Assistance 24 hours a day   Services Offered    Area [employer]  24-hour crisis assistance: [phone] [place], [place]   [facility]  24-hour crisis assistance:5026089951 [place], [place]  [place]   24-hour crisis assistance: [phone]  [place], [place]   [facility] Access to Care Line  24-hour crisis assistance; [phone] All   [facility]  24-hour crisis response line: 8136919780 All   Other Local Resources (Updated 04/2015)  Inpatient Behavioral Health/Residential Substance Abuse Treatment Programs   Services      Address and Phone Number  [facility])   14-day residential rehabilitation  [phone] [address] [place], [place]  [facility] ([facility])    Detox - private pay only  14-day residential rehabilitation -  Medicaid, insurance, private pay only [phone], or [phone] [address], [place], [place] [address]   [facility] [employer] only  Multiple facilities [phone] admissions   [facility])   90-day program  Must be homeless to participate  [phone], or [phone]  [place], [facility]  [facility] [place] only [phone], or  [phone] [address] [place], [place] [address]  [facility]     Must make an appointment  Transportation is offered from [place] on [employer].  Accepts private pay, Sheryn Bison [facility] [phone]  [address] [place], [place] [address]   [employer]  Females only  Associated with the [facility] [phone] [address] [place], [place] [address]  [facility] only [phone], or [phone] [address] [place], [place]  [facility]    Detox  Residential rehabilitation  Private insurance only  Multiple locations [phone] admissions  [facility]    Private pay  Private insurance [phone] [address] [place], [name] [address]  [facility]    Males only  Fee required at time of admission [phone] [address] [place], [place] [address]  [facility]    Private pay only  [phone] [address] [place], [place]  RTS (Residential Fire[facility])    Detox - private pay, Medicaid  Residential rehabilitation for males  - Medicare, [employer], insurance, private pay [phone] [address] [place], [place]   [facility]  Walk-in interviews Monday - Saturday from 8 am - 4 pm  Individuals with legal charges are not eligible [phone] [address] [place], [place] [phone]  [facility]   Must be willing to work  Must attend Alcoholics Anonymous meetings [phone] [address] [place], [place]   [facility]    Faith-based program  Private pay only [phone] [address] [place], [place]

## 2015-09-25 NOTE — ED Notes (Signed)
Pt unable to void but refuses cath. Dr. Dalene Seltzer aware.

## 2015-09-26 LAB — RPR: RPR Ser Ql: NONREACTIVE

## 2015-09-26 LAB — HIV ANTIBODY (ROUTINE TESTING W REFLEX): HIV Screen 4th Generation wRfx: NONREACTIVE

## 2015-09-27 LAB — GC/CHLAMYDIA PROBE AMP (~~LOC~~) NOT AT ARMC
Chlamydia: NEGATIVE
Neisseria Gonorrhea: NEGATIVE

## 2015-09-28 LAB — URINE CULTURE: Culture: >=100000 — AB

## 2015-09-29 ENCOUNTER — Telehealth: Payer: Self-pay | Admitting: *Deleted

## 2015-09-29 NOTE — Progress Notes (Signed)
ED Antimicrobial Stewardship Positive Culture Follow Up   [name] is an 55 y.o. male who presented to [facility] on 09/25/2015 with a chief complaint of  Chief Complaint  Patient presents with  . Altered Mental Status    Recent Results (from the past 720 hour(s))  Urine culture     Status: Abnormal   Collection Time: 09/25/15  9:51 PM  Result Value Ref Range Status   Specimen Description URINE, RANDOM  Final   Special Requests NONE  Final   Culture >=100,000 COLONIES/mL ENTEROCOCCUS SPECIES (A)  Final   Report Status 09/28/2015 FINAL  Final   Organism ID, Bacteria ENTEROCOCCUS SPECIES (A)  Final      Susceptibility   Enterococcus species - MIC*    AMPICILLIN <=2 SENSITIVE Sensitive     LEVOFLOXACIN 1 SENSITIVE Sensitive     NITROFURANTOIN <=16 SENSITIVE Sensitive     VANCOMYCIN 1 SENSITIVE Sensitive     * >=100,000 COLONIES/mL ENTEROCOCCUS SPECIES    [x]  Treated with cephalexin, organism resistant to prescribed antimicrobial  New antibiotic prescription: Stop cephalexin. Take amoxicillin 500 mg PO BID x 14 days.  ED Provider: , PA-C  Cache Bills L. Jaynie Crumble, PharmD PGY2 Infectious Diseases Pharmacy Resident Pager: [phone] 09/29/2015 10:02 AM

## 2015-09-29 NOTE — ED Notes (Signed)
Post ED Visit - Positive Culture Follow-up: Unsuccessful Patient Follow-up  Culture assessed and recommendations reviewed by: []  , Pharm.D. []  Enzo Bi, Pharm.D., BCPS []  , Pharm.D. []  Celedonio Miyamoto, Pharm.D., BCPS []  [place], Garvin Fila.D., BCPS, AAHIVP []  , Pharm.D., BCPS, AAHIVP [x]  Georgina Pillion, Pharm.D. []  , [place].D.  Positive urine culture  []  Patient discharged without antimicrobial prescription and treatment is now indicated [x]  Organism is resistant to prescribed ED discharge antimicrobial []  Patient with positive blood cultures   Unable to contact patient after 3 attempts, letter will be sent to address on file  [place] 09/29/2015, 10:36 AM

## 2015-10-18 ENCOUNTER — Emergency Department (HOSPITAL_COMMUNITY)
Admission: EM | Admit: 2015-10-18 | Discharge: 2015-10-20 | Disposition: A | Discharge status: Other Acute Inpatient Hospital | Payer: Medicare Other | Attending: Emergency Medicine | Admitting: Emergency Medicine

## 2015-10-18 ENCOUNTER — Encounter (HOSPITAL_COMMUNITY): Payer: Self-pay | Admitting: Emergency Medicine

## 2015-10-18 DIAGNOSIS — F1920 Other psychoactive substance dependence, uncomplicated: Secondary | ICD-10-CM | POA: Diagnosis present

## 2015-10-18 DIAGNOSIS — N189 Chronic kidney disease, unspecified: Secondary | ICD-10-CM | POA: Insufficient documentation

## 2015-10-18 DIAGNOSIS — F1910 Other psychoactive substance abuse, uncomplicated: Secondary | ICD-10-CM | POA: Diagnosis not present

## 2015-10-18 DIAGNOSIS — I129 Hypertensive chronic kidney disease with stage 1 through stage 4 chronic kidney disease, or unspecified chronic kidney disease: Secondary | ICD-10-CM | POA: Insufficient documentation

## 2015-10-18 DIAGNOSIS — F419 Anxiety disorder, unspecified: Secondary | ICD-10-CM | POA: Diagnosis present

## 2015-10-18 DIAGNOSIS — R45851 Suicidal ideations: Secondary | ICD-10-CM | POA: Diagnosis present

## 2015-10-18 DIAGNOSIS — Z87891 Personal history of nicotine dependence: Secondary | ICD-10-CM | POA: Diagnosis not present

## 2015-10-18 DIAGNOSIS — F319 Bipolar disorder, unspecified: Secondary | ICD-10-CM | POA: Diagnosis present

## 2015-10-18 DIAGNOSIS — M069 Rheumatoid arthritis, unspecified: Secondary | ICD-10-CM | POA: Insufficient documentation

## 2015-10-18 DIAGNOSIS — F3130 Bipolar disorder, current episode depressed, mild or moderate severity, unspecified: Secondary | ICD-10-CM | POA: Diagnosis present

## 2015-10-18 DIAGNOSIS — Z79899 Other long term (current) drug therapy: Secondary | ICD-10-CM | POA: Insufficient documentation

## 2015-10-18 DIAGNOSIS — F3132 Bipolar disorder, current episode depressed, moderate: Secondary | ICD-10-CM

## 2015-10-18 LAB — BASIC METABOLIC PANEL
Anion gap: 7 (ref 5–15)
BUN: 8 mg/dL (ref 6–20)
CO2: 27 mmol/L (ref 22–32)
Calcium: 9.3 mg/dL (ref 8.9–10.3)
Chloride: 106 mmol/L (ref 101–111)
Creatinine, Ser: 0.89 mg/dL (ref 0.61–1.24)
GFR calc Af Amer: >60 mL/min (ref >60)
GFR calc non Af Amer: >60 mL/min (ref >60)
Glucose, Bld: 102 mg/dL — ABNORMAL HIGH (ref 65–99)
Potassium: 3.4 mmol/L — ABNORMAL LOW (ref 3.5–5.1)
Sodium: 140 mmol/L (ref 135–145)

## 2015-10-18 LAB — RAPID URINE DRUG SCREEN, HOSP PERFORMED
Amphetamines: NOT DETECTED
Barbiturates: NOT DETECTED
Benzodiazepines: NOT DETECTED
Cocaine: POSITIVE — AB
Opiates: NOT DETECTED
Tetrahydrocannabinol: POSITIVE — AB

## 2015-10-18 LAB — CBC
HCT: 44 % (ref 39.0–52.0)
Hemoglobin: 14.7 g/dL (ref 13.0–17.0)
MCH: 28.2 pg (ref 26.0–34.0)
MCHC: 33.4 g/dL (ref 30.0–36.0)
MCV: 84.3 fL (ref 78.0–100.0)
Platelets: 331 10*3/uL (ref 150–400)
RBC: 5.22 MIL/uL (ref 4.22–5.81)
RDW: 14.7 % (ref 11.5–15.5)
WBC: 10.2 10*3/uL (ref 4.0–10.5)

## 2015-10-18 LAB — ETHANOL: Alcohol, Ethyl (B): <5 mg/dL (ref <5)

## 2015-10-18 MED ORDER — LISINOPRIL 40 MG PO TABS
40.0000 mg | ORAL_TABLET | Freq: Every day | ORAL | Status: DC
  Administered 2015-10-18 – 2015-10-20 (×3): 40 mg via ORAL
  Filled 2015-10-18 (×3): qty 1

## 2015-10-18 NOTE — ED Provider Notes (Signed)
CSN: [id number]     Arrival date & time 10/18/15  0920 History   First MD Initiated Contact with Patient 10/18/15 [phone]     Chief Complaint  Patient presents with  . Detox    . Suicidal     (Consider location/radiation/quality/duration/timing/severity/associated sxs/prior Treatment) HPI Comments: 55 year old male with history of polysubstance abuse presents for concern for need for detox. The patient states he needs detox from crack cocaine. He states that he was at a [name] previously and was doing well but recently relapsed. He said the last time he used was 2 days ago. He says he has had a history of depression as well. Patient initially denied suicidal or homicidal ideation. When he heard that we did not have a detox center here and that he may need to be discharged with resources he said that he was going to go home and kill himself. He was very vague with his plan. He says he just can't continue life this way.   Past Medical History  Diagnosis Date  . Arthritis     per pt rheumatoid   . Anxiety   . Bipolar affective disorder (HCC)   . Depression   . Alcohol abuse   . Cocaine abuse     smokes crack-cocaine  . Chronic back pain   . Tobacco abuse   . Vitamin D deficiency   . Gout   . Hemorrhoids   . Hypertension   . GERD (gastroesophageal reflux disease)   . Arthritis   . Bipolar 1 disorder (HCC)   . Hepatitis C   . Back pain, chronic   . Chronic kidney disease    Past Surgical History  Procedure Laterality Date  . Knee surgery     Family History  Problem Relation Age of Onset  . Hypertension Brother   . Diabetes Brother   . Hypertension Mother   . Diabetes Mother   . Arthritis Mother   . Other Sister     bone disease   Social History  Substance Use Topics  . Smoking status: Former Games developer  . Smokeless tobacco: None  . Alcohol Use: Yes     Comment: 24 oz--3x's wkly     Review of Systems  Constitutional: Negative for fever, chills and  fatigue.  HENT: Negative for congestion, postnasal drip and rhinorrhea.   Eyes: Negative for visual disturbance.  Respiratory: Negative for cough, chest tightness and shortness of breath.   Cardiovascular: Negative for chest pain and palpitations.  Gastrointestinal: Negative for nausea, abdominal pain and diarrhea.  Genitourinary: Negative for dysuria, urgency and hematuria.  Musculoskeletal: Negative for myalgias and back pain.  Hematological: Does not bruise/bleed easily.  Psychiatric/Behavioral: Positive for suicidal ideas and dysphoric mood.      Allergies  Review of patient's allergies indicates no known allergies.  Home Medications   Prior to Admission medications   Medication Sig Start Date End Date Taking? Authorizing Provider  amantadine (SYMMETREL) 100 MG capsule Take 100 mg by mouth 3 (three) times daily. 08/23/15 08/22/16 Yes Historical Provider, MD  ARIPiprazole (ABILIFY) 10 MG tablet Take 1 tablet (10 mg total) by mouth daily. 02/01/15  Yes Beau Fanny, FNP  gabapentin (NEURONTIN) 300 MG capsule Take 1 capsule (300 mg total) by mouth 3 (three) times daily. 02/01/15  Yes Beau Fanny, FNP  hydrOXYzine (ATARAX/VISTARIL) 25 MG tablet Take 1 tablet (25 mg total) by mouth 3 (three) times daily as needed for anxiety. 02/01/15  Yes John C  Withrow, FNP  lithium carbonate 150 MG capsule Take 3 capsules (450 mg total) by mouth 2 (two) times daily with a meal. 02/01/15  Yes Beau Fanny, FNP  pantoprazole (PROTONIX) 40 MG tablet Take 1 tablet (40 mg total) by mouth daily. 02/01/15  Yes Beau Fanny, FNP  sertraline (ZOLOFT) 100 MG tablet Take 100 mg by mouth daily. 08/23/15  Yes Historical Provider, MD  tamsulosin (FLOMAX) 0.4 MG CAPS capsule Take 1 capsule (0.4 mg total) by mouth daily. 02/01/15  Yes Beau Fanny, FNP  traZODone (DESYREL) 100 MG tablet Take 1 tablet (100 mg total) by mouth at bedtime as needed for sleep. 02/01/15  Yes Beau Fanny, FNP  cephALEXin (KEFLEX)  500 MG capsule Take 2 capsules (1,000 mg total) by mouth 2 (two) times daily. Patient not taking: Reported on 10/18/2015 09/25/15   Arby Barrette, MD   BP 178/110 mmHg  Pulse 81  Temp(Src) 98.2 F (36.8 C) (Oral)  Resp 16  SpO2 100% Physical Exam  Constitutional: He is oriented to person, place, and time. He appears well-developed and well-nourished. No distress.  HENT:  Head: Normocephalic and atraumatic.  Right Ear: External ear normal.  Left Ear: External ear normal.  Mouth/Throat: Oropharynx is clear and moist. No oropharyngeal exudate.  Eyes: EOM are normal. Pupils are equal, round, and reactive to light.  Neck: Normal range of motion. Neck supple.  Cardiovascular: Normal rate, regular rhythm, normal heart sounds and intact distal pulses.   No murmur heard. Pulmonary/Chest: Effort normal. No respiratory distress. He has no wheezes. He has no rales.  Abdominal: Soft. He exhibits no distension. There is no tenderness.  Musculoskeletal: He exhibits no edema.  Neurological: He is alert and oriented to person, place, and time.  Skin: Skin is warm and dry. No rash noted. He is not diaphoretic.  Psychiatric: His affect is blunt. His speech is not rapid and/or pressured. He is not agitated, not aggressive and not hyperactive. Thought content is not paranoid. He expresses suicidal ideation. He expresses no homicidal ideation. He expresses no homicidal plans.  Vitals reviewed.   ED Course  Procedures (including critical care time) Labs Review Labs Reviewed  BASIC METABOLIC PANEL - Abnormal; Notable for the following:    Potassium 3.4 (*)    Glucose, Bld 102 (*)    All other components within normal limits  URINE RAPID DRUG SCREEN, HOSP PERFORMED - Abnormal; Notable for the following:    Cocaine POSITIVE (*)    Tetrahydrocannabinol POSITIVE (*)    All other components within normal limits  CBC  ETHANOL    Imaging Review No results found. I have personally reviewed and  evaluated these images and lab results as part of my medical decision-making.   EKG Interpretation None      MDM  Patient was seen and evaluated in stable condition. Patient noted to be hypertensive with long-standing history of hypertension. He has not been taking his hypertensive medications. Lisinopril was reordered. Laboratory results unremarkable. Patient medically cleared. He was seen by TTS who recommended the patient stay overnight and be reevaluated in the morning. I agree with this plan at this time. Final diagnoses:  None    1. Polysubstance abuse  2. Suicidal ideation    Leta Baptist, MD 10/18/15 [phone]

## 2015-10-18 NOTE — ED Notes (Signed)
Pt oriented to room and unit.  Pt states "I am suicidal and I am feeling cravings"  He then turned over and went to sleep.  He appears calm and comfortable.  15 minute checks and video monitoring in place.  Pt was asked to give urine sample and he told me he gave one in the main ED.

## 2015-10-18 NOTE — Progress Notes (Signed)
Staffed with EDP and provided resources for patient regarding substance abuse services, outpatient and residential.  Elenore Paddy [phone] ED CSW 10/18/2015 10:36 AM

## 2015-10-18 NOTE — BH Assessment (Addendum)
Assessment Note  [name] is an 55 y.o. male that presents this date requesting detoxification services from Cocaine. Patient also states he has thoughts of self harm with a plan to borrow a friend's gun to shoot himself. Patient states he has been receiving services from the [name] and is diagnosised with bipolar d/o although he left a rehabilitation facility 2 months ago for SA use (treatment provided by [name]) and relapsed upon release. Patient states he has not been on any medications and feels "they don't work." [facility] has a history with [facility] and was last seen at [facility] on 09/25/15 for an inpatient admission. Patient states he has not followed up with any after care in the form of outpatient services because "it doesn't work." Patient states he is so frustrated this date that he would borrow his friends gun to "shoot himself." Patient reports multiple stressors to include: recent deaths in his family, lack of family support and ongoing SA issues that are "out of control." Patient reports daily use of cocaine (up to 2 grams) with last reported use on 10/17/15 when patient reported using over 2 grams of cocaine. Patient reports withdrawal symptoms to include, depression, agitation and cravings. Admission note stated: "Pt presents to ED seeking detox off of cocaine. Pt was d/c from the [facility] hospital a month and a half ago after being treated for depression and cocaine use. Pt sts since coming home he "fell off the wagon again." After pt told he would be going back home pt called RN in room to say he is now having suicidal thoughts. No plan at this time. MD made aware". Case was staffed with Shaune Pollack DNP who recommended patient be observed and re-evaluated in the a.m.   Diagnosis: Bipolar I disorder, most recent episode depressed, Cocaine use D/O severe.  Past Medical History:  Past Medical History  Diagnosis Date  . Arthritis     per pt rheumatoid   . Anxiety   . Bipolar affective disorder (HCC)   . Depression    . Alcohol abuse   . Cocaine abuse     smokes crack-cocaine  . Chronic back pain   . Tobacco abuse   . Vitamin D deficiency   . Gout   . Hemorrhoids   . Hypertension   . GERD (gastroesophageal reflux disease)   . Arthritis   . Bipolar 1 disorder (HCC)   . Hepatitis C   . Back pain, chronic   . Chronic kidney disease     Past Surgical History  Procedure Laterality Date  . Knee surgery      Family History:  Family History  Problem Relation Age of Onset  . Hypertension Brother   . Diabetes Brother   . Hypertension Mother   . Diabetes Mother   . Arthritis Mother   . Other Sister     bone disease    Social History:  reports that he has quit smoking. He does not have any smokeless tobacco history on file. He reports that he drinks alcohol. He reports that he uses illicit drugs (Marijuana and Cocaine).  Additional Social History:  Alcohol / Drug Use Pain Medications: See MAR Prescriptions: See MAR Over the Counter: See MAR History of alcohol / drug use?: Yes Longest period of sobriety (when/how long): 1 year Withdrawal Symptoms: Agitation Substance #1 Name of Substance 1: Cocaine  1 - Age of First Use: 24 1 - Amount (size/oz): 3 grams  1 - Frequency: daily 1 - Duration: Last 6  months 1 - Last Use / Amount: 10/17/15 3 grams Substance #2 Name of Substance 2: Alcohol 2 - Age of First Use: 21 2 - Amount (size/oz): 3 to 4 40oz beers  2 - Frequency: two times a week 2 - Duration: Last 10 years 2 - Last Use / Amount: 10/13/15 pt reported consuming 3 12oz beers   CIWA: CIWA-Ar BP: (!) 178/110 mmHg Pulse Rate: 81 COWS:    Allergies: No Known Allergies  Home Medications:  (Not in a hospital admission)  OB/GYN Status:  No LMP for male patient.  General Assessment Data [facility]: [facility] Assessment: In system Is this a Tele or Face-to-Face Assessment?: Face-to-Face Is this an Initial Assessment or a Re-assessment for this encounter?: Initial  Assessment Marital status: Single Maiden name: na Is patient pregnant?: No Pregnancy Status: No Living Arrangements: Alone Can pt return to current living arrangement?: Yes Admission Status: Voluntary Is patient capable of signing voluntary admission?: Yes Referral Source: Self/Family/Friend Insurance type: Medicaid  Medical Screening Exam [facility] Walk-in ONLY) Medical Exam completed: Yes  Crisis Care Plan Living Arrangements: Alone Legal Guardian: Other: (None) Name of Psychiatrist: Pt does not know Name of Therapist: None  Education Status Is patient currently in school?: No Current Grade: na Highest grade of school patient has completed: 12 Name of school: na Contact person: na  Risk to self with the past 6 months Suicidal Ideation: Yes-Currently Present Has patient been a risk to self within the past 6 months prior to admission? : No Suicidal Intent: Yes-Currently Present Has patient had any suicidal intent within the past 6 months prior to admission? : No Is patient at risk for suicide?: Yes Suicidal Plan?: Yes-Currently Present Has patient had any suicidal plan within the past 6 months prior to admission? : No Specify Current Suicidal Plan: patient has access to a friends gun Access to Means: Yes Specify Access to Suicidal Means: pt has a friend with a gun What has been your use of drugs/alcohol within the last 12 months?: Current use Previous Attempts/Gestures: Yes How many times?: 2 Other Self Harm Risks: none Triggers for Past Attempts: Other (Comment) (death in family ) Intentional Self Injurious Behavior: None Family Suicide History: No Recent stressful life event(s): Other (Comment) (family issues) Persecutory voices/beliefs?: No Depression: Yes Depression Symptoms: Feeling worthless/self pity, Loss of interest in usual pleasures Substance abuse history and/or treatment for substance abuse?: Yes Suicide prevention information given to non-admitted patients:  Not applicable  Risk to Others within the past 6 months Homicidal Ideation: No Does patient have any lifetime risk of violence toward others beyond the six months prior to admission? : No Thoughts of Harm to Others: No Current Homicidal Intent: No Current Homicidal Plan: No Access to Homicidal Means: No Identified Victim: na History of harm to others?: No Assessment of Violence: None Noted Violent Behavior Description: na Does patient have access to weapons?: No Criminal Charges Pending?: No Does patient have a court date: No Is patient on probation?: No  Psychosis Hallucinations: None noted Delusions: None noted  Mental Status Report Appearance/Hygiene: Unremarkable Eye Contact: Fair Motor Activity: Freedom of movement Speech: Logical/coherent Level of Consciousness: Alert Mood: Anxious Affect: Anxious Anxiety Level: Moderate Thought Processes: Coherent, Relevant Judgement: Unimpaired Orientation: Person, Place, Time Obsessive Compulsive Thoughts/Behaviors: None  Cognitive Functioning Concentration: Decreased Memory: Recent Intact, Remote Intact IQ: Average Insight: Fair Impulse Control: Poor Appetite: Fair Weight Loss: 0 Weight Gain: 0 Sleep: Decreased Total Hours of Sleep: 3 Vegetative Symptoms: None  ADLScreening [facility] Assessment Services) Patient's cognitive ability adequate to safely complete daily activities?: Yes Patient able to express need for assistance with ADLs?: Yes Independently performs ADLs?: Yes (appropriate for developmental age)  Prior Inpatient Therapy Prior Inpatient Therapy: Yes Prior Therapy Dates: 2017 [facility] use  Prior Outpatient Therapy Prior Outpatient Therapy: Yes Prior Therapy Dates: 2017 [facility] Reason for Treatment: SA use Does patient have an ACCT team?: No Does patient have Intensive In-House Services?  : No Does patient have [facility]  services? : No Does patient have P4CC services?: No  ADL Screening (condition at time of admission) Patient's cognitive ability adequate to safely complete daily activities?: Yes Is the patient deaf or have difficulty hearing?: No Does the patient have difficulty seeing, even when wearing glasses/contacts?: No Does the patient have difficulty concentrating, remembering, or making decisions?: No Patient able to express need for assistance with ADLs?: Yes Does the patient have difficulty dressing or bathing?: No Independently performs ADLs?: Yes (appropriate for developmental age) Does the patient have difficulty walking or climbing stairs?: No Weakness of Legs: None Weakness of Arms/Hands: None     Therapy Consults (therapy consults require a physician order) PT Evaluation Needed: No OT Evalulation Needed: No SLP Evaluation Needed: No Abuse/Neglect Assessment (Assessment to be complete while patient is alone) Physical Abuse: Denies Verbal Abuse: Denies Sexual Abuse: Denies Exploitation of patient/patient's resources: Denies Self-Neglect: Denies Values / Beliefs Cultural Requests During Hospitalization: None Spiritual Requests During Hospitalization: None Consults Spiritual Care Consult Needed: No Social Work Consult Needed: No Merchant navy officer (For Healthcare) Does patient have an advance directive?: No Would patient like information on creating an advanced directive?: No - patient declined information (pt declines information)    Additional Information 1:1 In Past 12 Months?: No CIRT Risk: No Elopement Risk: No Does patient have medical clearance?: Yes     Disposition: Case was staffed with Shaune Pollack DNP who recommended patient be observed and re-evaluated in the a.m.   Disposition Initial Assessment Completed for this Encounter: Yes Disposition of Patient: Other dispositions (Re-evaluated in the a.m.) Other disposition(s): Other (Comment) (re-evaluated in the  a.m.)  On Site Evaluation by:   Reviewed with Physician:    Alfredia Ferguson 10/18/2015 1:18 PM

## 2015-10-18 NOTE — ED Notes (Addendum)
Pt presents to ED seeking detox off of cocaine. Pt was d/c from the [facility] hospital a month and a half ago after being treated for depression and cocaine use. Pt sts since coming home he "fell off the wagon again." Pt denies SI/HI. Denies hallucinations. Pt has hx of High blood pressure and is not on medication at this time.

## 2015-10-18 NOTE — BH Assessment (Signed)
[facility] Assessment Progress Note   Case was staffed with Lord DNP who recommended patient be observed and re-evaluated in the a.m   

## 2015-10-18 NOTE — ED Notes (Signed)
Pt remains SI, AAO x 3, no distress noted, calm & cooperative, monitoring for safety, Q 15 min checks in effect.

## 2015-10-18 NOTE — Progress Notes (Signed)
Pt confirms pcp is at [facility] updated

## 2015-10-18 NOTE — ED Notes (Signed)
After pt told he would be going back home pt called RN in room to say he is now having suicidal thoughts. No plan at this time. MD made aware. Pt to be dressed out and wanded by security.

## 2015-10-19 DIAGNOSIS — F3130 Bipolar disorder, current episode depressed, mild or moderate severity, unspecified: Secondary | ICD-10-CM | POA: Diagnosis not present

## 2015-10-19 DIAGNOSIS — R45851 Suicidal ideations: Secondary | ICD-10-CM | POA: Diagnosis not present

## 2015-10-19 MED ORDER — HYDROXYZINE HCL 25 MG PO TABS
25.0000 mg | ORAL_TABLET | Freq: Three times a day (TID) | ORAL | Status: DC | PRN
  Administered 2015-10-19: 25 mg via ORAL
  Filled 2015-10-19: qty 1

## 2015-10-19 MED ORDER — SERTRALINE HCL 50 MG PO TABS
100.0000 mg | ORAL_TABLET | Freq: Every day | ORAL | Status: DC
  Administered 2015-10-19 – 2015-10-20 (×2): 100 mg via ORAL
  Filled 2015-10-19 (×2): qty 2

## 2015-10-19 MED ORDER — AMANTADINE HCL 100 MG PO CAPS
100.0000 mg | ORAL_CAPSULE | Freq: Two times a day (BID) | ORAL | Status: DC
  Administered 2015-10-19 – 2015-10-20 (×3): 100 mg via ORAL
  Filled 2015-10-19 (×3): qty 1

## 2015-10-19 MED ORDER — GABAPENTIN 300 MG PO CAPS
300.0000 mg | ORAL_CAPSULE | Freq: Three times a day (TID) | ORAL | Status: DC
  Administered 2015-10-19 – 2015-10-20 (×3): 300 mg via ORAL
  Filled 2015-10-19 (×3): qty 1

## 2015-10-19 MED ORDER — ARIPIPRAZOLE 10 MG PO TABS
10.0000 mg | ORAL_TABLET | Freq: Every day | ORAL | Status: DC
  Administered 2015-10-19 – 2015-10-20 (×2): 10 mg via ORAL
  Filled 2015-10-19 (×2): qty 1

## 2015-10-19 MED ORDER — TAMSULOSIN HCL 0.4 MG PO CAPS
0.4000 mg | ORAL_CAPSULE | Freq: Every day | ORAL | Status: DC
  Administered 2015-10-19 – 2015-10-20 (×2): 0.4 mg via ORAL
  Filled 2015-10-19 (×2): qty 1

## 2015-10-19 MED ORDER — TRAZODONE HCL 100 MG PO TABS: 100.0000 mg | ORAL_TABLET | Freq: Every evening | ORAL | Status: DC | PRN

## 2015-10-19 NOTE — Consult Note (Signed)
[facility] Consult   Reason for Consult:  Detox  Referring Physician:  EDP Patient Identification: [name]:  [id number] Principal Diagnosis: Bipolar I disorder, most recent episode depressed [facility]) Diagnosis:   Patient Active Problem List   Diagnosis Date Noted  . GERD (gastroesophageal reflux disease) [K21.9]   . Bipolar 1 disorder ([place]) [F31.9]   . Hepatitis C [B19.20]   . Back pain, chronic [M54.9, G89.29]   . Chronic kidney disease [N18.9]   . Affective psychosis, bipolar ([place]) [F31.9]   . Alcohol use disorder, severe, dependence ([place]) [F10.20]   . Cocaine use disorder, severe, dependence ([place]) [F14.20]   . Generalized abdominal pain [R10.84]   . Bipolar I disorder, most recent episode depressed ([place]) [F31.30]   . Bipolar disorder, current episode depressed, severe, without psychotic features ([place]) [F31.4]   . Bipolar affective disorder, depressed, severe ([place]) [F31.4] 03/26/2014  . Right hip pain [M25.551]   . Medically noncompliant [Z91.19] 03/23/2014  . Dysuria [R30.0] 03/22/2013  . Headache [R51] 03/22/2013  . Polysubstance dependence ([place]) [F19.20] 03/21/2012  . Anxiety [F41.9]   . Bipolar affective disorder ([place]) [F31.9]   . Polysubstance abuse [F19.10]   . Gout [M10.9]   . Vitamin D deficiency [E55.9]   . Hepatitis C [B19.20]     Total Time spent with patient: 45 minutes  Subjective:   [name] is a 55 y.o. male patient admitted with cocaine abuse, requesting detox.  HPI:   [name] is an 55 y.o. male that presents this date requesting detoxification services from cocaine.  He is seen today and he states that he needs help detoxing from cocaine.  He developed suicidal ideations, with history of Bipolar disorder.  He is non compliant with his medications and vague reasons   Patient states he has been receiving services from the [place].  He has a history of outpatient substance rehab and relapses.   [facility] a [facility] with [facility] and  was last seen at [facility] on 09/25/15 for an inpatient admission. Patient states multiple stressors to include: recent deaths in his family, lack of family support and ongoing SA issues that are "out of control." Patient reports daily use of cocaine (up to 2 grams) with last reported use on 10/17/15 when patient reported using over 2 grams of cocaine.  Past Psychiatric History:  See HPI  Risk to Self: Suicidal Ideation: Yes-Currently Present Suicidal Intent: Yes-Currently Present Is patient at risk for suicide?: Yes Suicidal Plan?: Yes-Currently Present Specify Current Suicidal Plan: patient has access to a friends gun Access to Means: Yes Specify Access to Suicidal Means: pt has a friend with a gun What has been your use of drugs/alcohol within the last 12 months?: Current use How many times?: 2 Other Self Harm Risks: none Triggers for Past Attempts: Other (Comment) (death in family ) Intentional Self Injurious Behavior: None Risk to Others: Homicidal Ideation: No Thoughts of Harm to Others: No Current Homicidal Intent: No Current Homicidal Plan: No Access to Homicidal Means: No Identified Victim: na History of harm to others?: No Assessment of Violence: None Noted Violent Behavior Description: na Does patient have access to weapons?: No Criminal Charges Pending?: No Does patient have a court date: No Prior Inpatient Therapy: Prior Inpatient Therapy: Yes Prior Therapy Dates: 2017 [facility] Reason [facility]: SA use Prior Outpatient Therapy: Prior Outpatient Therapy: Yes Prior Therapy Dates: 2017 [facility] Reason for Treatment: SA use Does patient have an  ACCT team?: No Does patient have Intensive In-House Services?  : No Does patient have [facility] services? : No Does patient have P4CC services?: No  Past Medical History:  Past Medical History  Diagnosis Date  . Arthritis     per pt rheumatoid   . Anxiety   . Bipolar  affective disorder ([place])   . Depression   . Alcohol abuse   . Cocaine abuse     smokes crack-cocaine  . Chronic back pain   . Tobacco abuse   . Vitamin D deficiency   . Gout   . Hemorrhoids   . Hypertension   . GERD (gastroesophageal reflux disease)   . Arthritis   . Bipolar 1 disorder ([place])   . Hepatitis C   . Back pain, chronic   . Chronic kidney disease     Past Surgical History  Procedure Laterality Date  . Knee surgery     Family History:  Family History  Problem Relation Age of Onset  . Hypertension Brother   . Diabetes Brother   . Hypertension Mother   . Diabetes Mother   . Arthritis Mother   . Other Sister     bone disease   Family Psychiatric  History:  See HPI Social History:  History  Alcohol Use  . Yes    Comment: 24 oz--3x's wkly      History  Drug Use  . Yes  . Special: Marijuana, Cocaine    Comment: THC   cocaine,     Social History   Social History  . Marital Status: Single    Spouse Name: N/A  . Number of Children: N/A  . Years of Education: N/A   Social History Main Topics  . Smoking status: Former Research scientist (life sciences)  . Smokeless tobacco: None  . Alcohol Use: Yes     Comment: 24 oz--3x's wkly   . Drug Use: Yes    Special: Marijuana, Cocaine     Comment: THC   cocaine,   . Sexual Activity: Not Currently   Other Topics Concern  . None   Social History Narrative   ** Merged History Encounter **       Lives alone in [place], [place]   Additional Social History:    Allergies:  No Known Allergies  Labs:  Results for orders placed or performed during the hospital encounter of 10/18/15 (from the past 48 hour(s))  CBC     Status: None   Collection Time: 10/18/15 11:02 AM  Result Value Ref Range   WBC 10.2 4.0 - 10.5 K/uL   RBC 5.22 4.22 - 5.81 MIL/uL   Hemoglobin 14.7 13.0 - 17.0 g/dL   HCT 44.0 39.0 - 52.0 %   MCV 84.3 78.0 - 100.0 fL   MCH 28.2 26.0 - 34.0 pg   MCHC 33.4 30.0 - 36.0 g/dL   RDW 14.7 11.5 - 15.5 %   Platelets 331  150 - 400 K/uL  Basic metabolic panel     Status: Abnormal   Collection Time: 10/18/15 11:02 AM  Result Value Ref Range   Sodium 140 135 - 145 mmol/L   Potassium 3.4 (L) 3.5 - 5.1 mmol/L   Chloride 106 101 - 111 mmol/L   CO2 27 22 - 32 mmol/L   Glucose, Bld 102 (H) 65 - 99 mg/dL   BUN 8 6 - 20 mg/dL   Creatinine, Ser 0.89 0.61 - 1.24 mg/dL   Calcium 9.3 8.9 - 10.3 mg/dL   GFR calc non  Af Amer >60 >60 mL/min   GFR calc Af Amer >60 >60 mL/min    Comment: (NOTE) The eGFR has been calculated using the CKD EPI equation. This calculation has not been validated in all clinical situations. eGFR's persistently <60 mL/min signify possible Chronic Kidney Disease.    Anion gap 7 5 - 15  Ethanol     Status: None   Collection Time: 10/18/15 11:03 AM  Result Value Ref Range   Alcohol, Ethyl (B) <5 <5 mg/dL    Comment:        LOWEST DETECTABLE LIMIT FOR SERUM ALCOHOL IS 5 mg/dL FOR MEDICAL PURPOSES ONLY   Rapid urine drug screen (hospital performed)     Status: Abnormal   Collection Time: 10/18/15  3:50 PM  Result Value Ref Range   Opiates NONE DETECTED NONE DETECTED   Cocaine POSITIVE (A) NONE DETECTED   Benzodiazepines NONE DETECTED NONE DETECTED   Amphetamines NONE DETECTED NONE DETECTED   Tetrahydrocannabinol POSITIVE (A) NONE DETECTED   Barbiturates NONE DETECTED NONE DETECTED    Comment:        DRUG SCREEN FOR MEDICAL PURPOSES ONLY.  IF CONFIRMATION IS NEEDED FOR ANY PURPOSE, NOTIFY LAB WITHIN 5 DAYS.        LOWEST DETECTABLE LIMITS FOR URINE DRUG SCREEN Drug Class       Cutoff (ng/mL) Amphetamine      1000 Barbiturate      200 Benzodiazepine   177 Tricyclics       939 Opiates          300 Cocaine          300 THC              50     Current Facility-Administered Medications  Medication Dose Route Frequency Provider Last Rate Last Dose  . amantadine (SYMMETREL) capsule 100 mg  100 mg Oral BID Corena Pilgrim, MD   100 mg at 10/19/15 1302  . ARIPiprazole (ABILIFY)  tablet 10 mg  10 mg Oral Daily Zaela Graley, MD   10 mg at 10/19/15 1301  . gabapentin (NEURONTIN) capsule 300 mg  300 mg Oral TID Corena Pilgrim, MD   300 mg at 10/19/15 1301  . hydrOXYzine (ATARAX/VISTARIL) tablet 25 mg  25 mg Oral TID PRN Corena Pilgrim, MD      . lisinopril (PRINIVIL,ZESTRIL) tablet 40 mg  40 mg Oral Daily Harvel Quale, MD   40 mg at 10/19/15 1023  . sertraline (ZOLOFT) tablet 100 mg  100 mg Oral Daily Skylen Danielsen, MD   100 mg at 10/19/15 1301  . tamsulosin (FLOMAX) capsule 0.4 mg  0.4 mg Oral Daily Jeneen Doutt, MD   0.4 mg at 10/19/15 1301  . traZODone (DESYREL) tablet 100 mg  100 mg Oral QHS PRN Corena Pilgrim, MD       Current Outpatient Prescriptions  Medication Sig Dispense Refill  . amantadine (SYMMETREL) 100 MG capsule Take 100 mg by mouth 3 (three) times daily.    . ARIPiprazole (ABILIFY) 10 MG tablet Take 1 tablet (10 mg total) by mouth daily. 30 tablet 0  . gabapentin (NEURONTIN) 300 MG capsule Take 1 capsule (300 mg total) by mouth 3 (three) times daily. 90 capsule 0  . hydrOXYzine (ATARAX/VISTARIL) 25 MG tablet Take 1 tablet (25 mg total) by mouth 3 (three) times daily as needed for anxiety. 30 tablet 0  . lithium carbonate 150 MG capsule Take 3 capsules (450 mg total) by mouth 2 (two) times daily  with a meal. 180 capsule 0  . pantoprazole (PROTONIX) 40 MG tablet Take 1 tablet (40 mg total) by mouth daily.    . sertraline (ZOLOFT) 100 MG tablet Take 100 mg by mouth daily.    . tamsulosin (FLOMAX) 0.4 MG CAPS capsule Take 1 capsule (0.4 mg total) by mouth daily. 30 capsule 0  . traZODone (DESYREL) 100 MG tablet Take 1 tablet (100 mg total) by mouth at bedtime as needed for sleep. 30 tablet 0  . cephALEXin (KEFLEX) 500 MG capsule Take 2 capsules (1,000 mg total) by mouth 2 (two) times daily. (Patient not taking: Reported on 10/18/2015) 28 capsule 0    Musculoskeletal: Strength & Muscle Tone: within normal limits Gait & Station: normal Patient  leans: N/A  Psychiatric Specialty Exam: Physical Exam  Vitals reviewed.   ROS  Blood pressure 134/80, pulse 66, temperature 97.6 F (36.4 C), temperature source Oral, resp. rate 18, SpO2 99 %.There is no weight on file to calculate BMI.  General Appearance: Disheveled  Eye Contact:  Fair  Speech:  Garbled  Volume:  Normal  Mood:  Anxious, Depressed and Hopeless  Affect:  Constricted, Depressed and Flat  Thought Process:  Coherent  Orientation:  Full (Time, Place, and Person)  Thought Content:  Rumination  Suicidal Thoughts:  Yes.  without intent/plan  Homicidal Thoughts:  No  Memory:  Immediate;   Fair Recent;   Fair Remote;   Fair  Judgement:  Fair  Insight:  Fair  Psychomotor Activity:  Normal  Concentration:  Concentration: Fair and Attention Span: Good  Recall:  Good  Fund of Knowledge:  Good  Language:  Good  Akathisia:  Negative  Handed:  Right  AIMS (if indicated):     Assets:  Desire for Improvement Resilience  ADL's:  Intact  Cognition:  WNL  Sleep:  poor   Treatment Plan Summary: Daily contact with patient to assess and evaluate symptoms and progress in treatment, Medication management and Plan seek inpatient placement  Disposition: Recommend psychiatric Inpatient admission when medically cleared.  Janett Labella, NP [facility] 10/19/2015 3:36 PM Patient seen face-to-face for psychiatric evaluation, chart reviewed and case discussed with the physician extender and developed treatment plan. Reviewed the information documented and agree with the treatment plan. Corena Pilgrim, MD

## 2015-10-19 NOTE — Progress Notes (Signed)
[facility] reached out to CSW and informed her that pt has been accepted to facility and is welcomed to come tomorrow morning after 8:00am.  Accepting Physician: Dr. Merideth Abbey  Unit: [facility]  Nurse Report Number: [phone]  Trish Mage [phone] ED CSW 10/19/2015 8:20 PM

## 2015-10-19 NOTE — ED Notes (Signed)
Pt is resting quietly in room.  Appears calm and cooperative.  Patient continues to c/o suicidal thoughts.  15 minute checks and video monitoring continue.

## 2015-10-19 NOTE — BH Assessment (Signed)
[facility] Assessment Progress Note  Per Thedore Mins, MD, this pt requires psychiatric hospitalization at this time. The following facilities have been contacted to seek placement for this pt, with results as noted:  Beds available, information sent, decision pending:  [facility] [place]   Declined:  [facility] (insurance panel problems)   At capacity:  [facility] Erling Conte, [place] Triage Specialist [phone]

## 2015-10-19 NOTE — ED Notes (Signed)
Pt awake, alert & responsive, no distress noted, calm & cooperative, resting at present.  Monitoring for safety, Q 15 min checks in effect.

## 2015-10-20 DIAGNOSIS — R45851 Suicidal ideations: Secondary | ICD-10-CM | POA: Diagnosis not present

## 2015-10-20 NOTE — ED Notes (Signed)
[facility] transport at facility to transfer pt to [facility] per MD order. Pt signed for personal belongings and property given to transport service for transfer. Pt signed e-signature. Ambulatory off unit with transport service.

## 2015-10-22 DIAGNOSIS — I10 Essential (primary) hypertension: Secondary | ICD-10-CM | POA: Diagnosis not present

## 2015-10-22 DIAGNOSIS — N401 Enlarged prostate with lower urinary tract symptoms: Secondary | ICD-10-CM | POA: Diagnosis not present

## 2015-10-22 DIAGNOSIS — K219 Gastro-esophageal reflux disease without esophagitis: Secondary | ICD-10-CM | POA: Diagnosis not present

## 2015-10-22 DIAGNOSIS — E860 Dehydration: Secondary | ICD-10-CM | POA: Diagnosis not present

## 2015-11-03 IMAGING — CR DG CHEST 1V PORT
1 series · 1 of 1 positions shown · non-contrast
Comparison: 03/22/2013

CLINICAL DATA: Chest pain, cough, shortness of breath

EXAM:
PORTABLE CHEST - 1 VIEW

[AP]
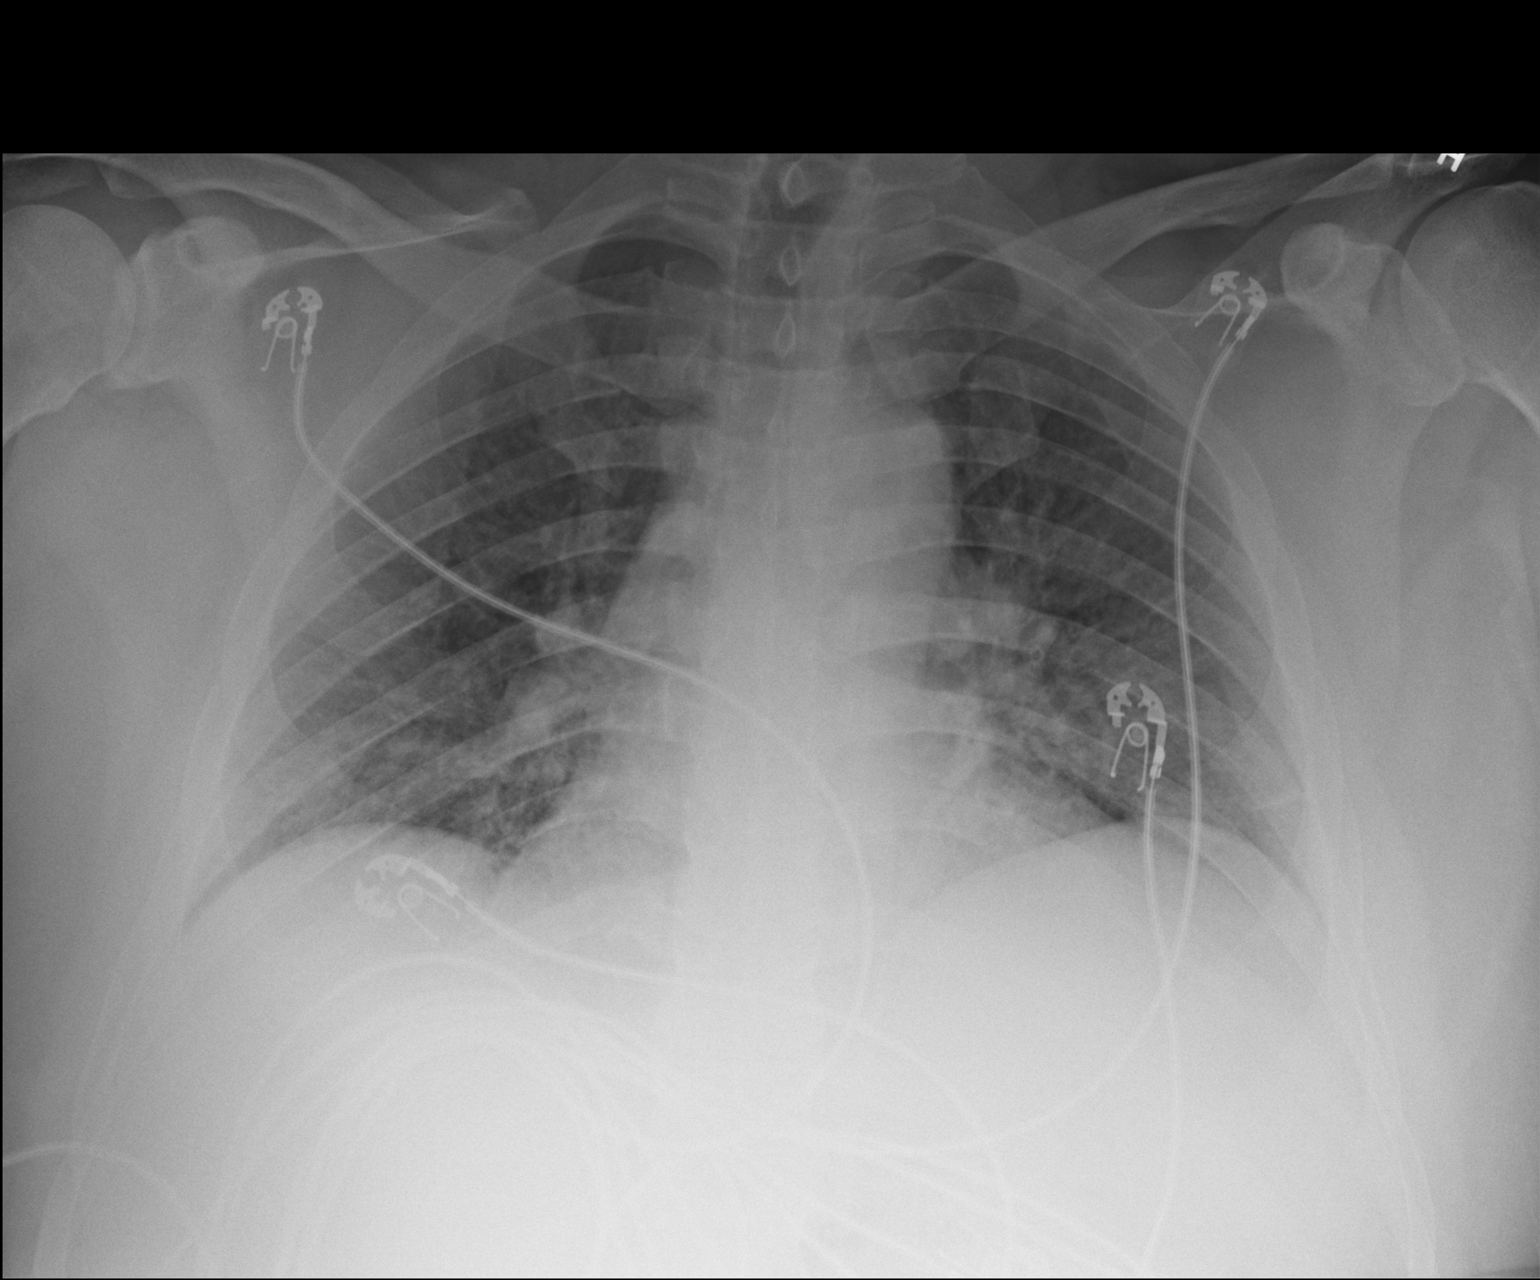

[1 of 1 positions shown; findings below may reference images not displayed]

FINDINGS: Low lung volumes with vascular crowding. Mild bibasilar opacities,
likely atelectasis. No pleural effusion or pneumothorax.

Cardiomegaly.
IMPRESSION: Low lung volumes with vascular crowding.

Mild bibasilar opacities, likely atelectasis.

## 2015-11-07 IMAGING — CR DG ABDOMEN 1V
3 series · 3 of 3 positions shown · non-contrast
Comparison: CT abdomen pelvis 03/23/2014

CLINICAL DATA: Abdominal patent radiating transversely in mid
abdomen accompanied by nausea and vomiting of a white liquid for 1
week, history hypertension, diabetes, smoking

EXAM:
ABDOMEN - 1 VIEW

[t abdomen supine (1 of 3)]
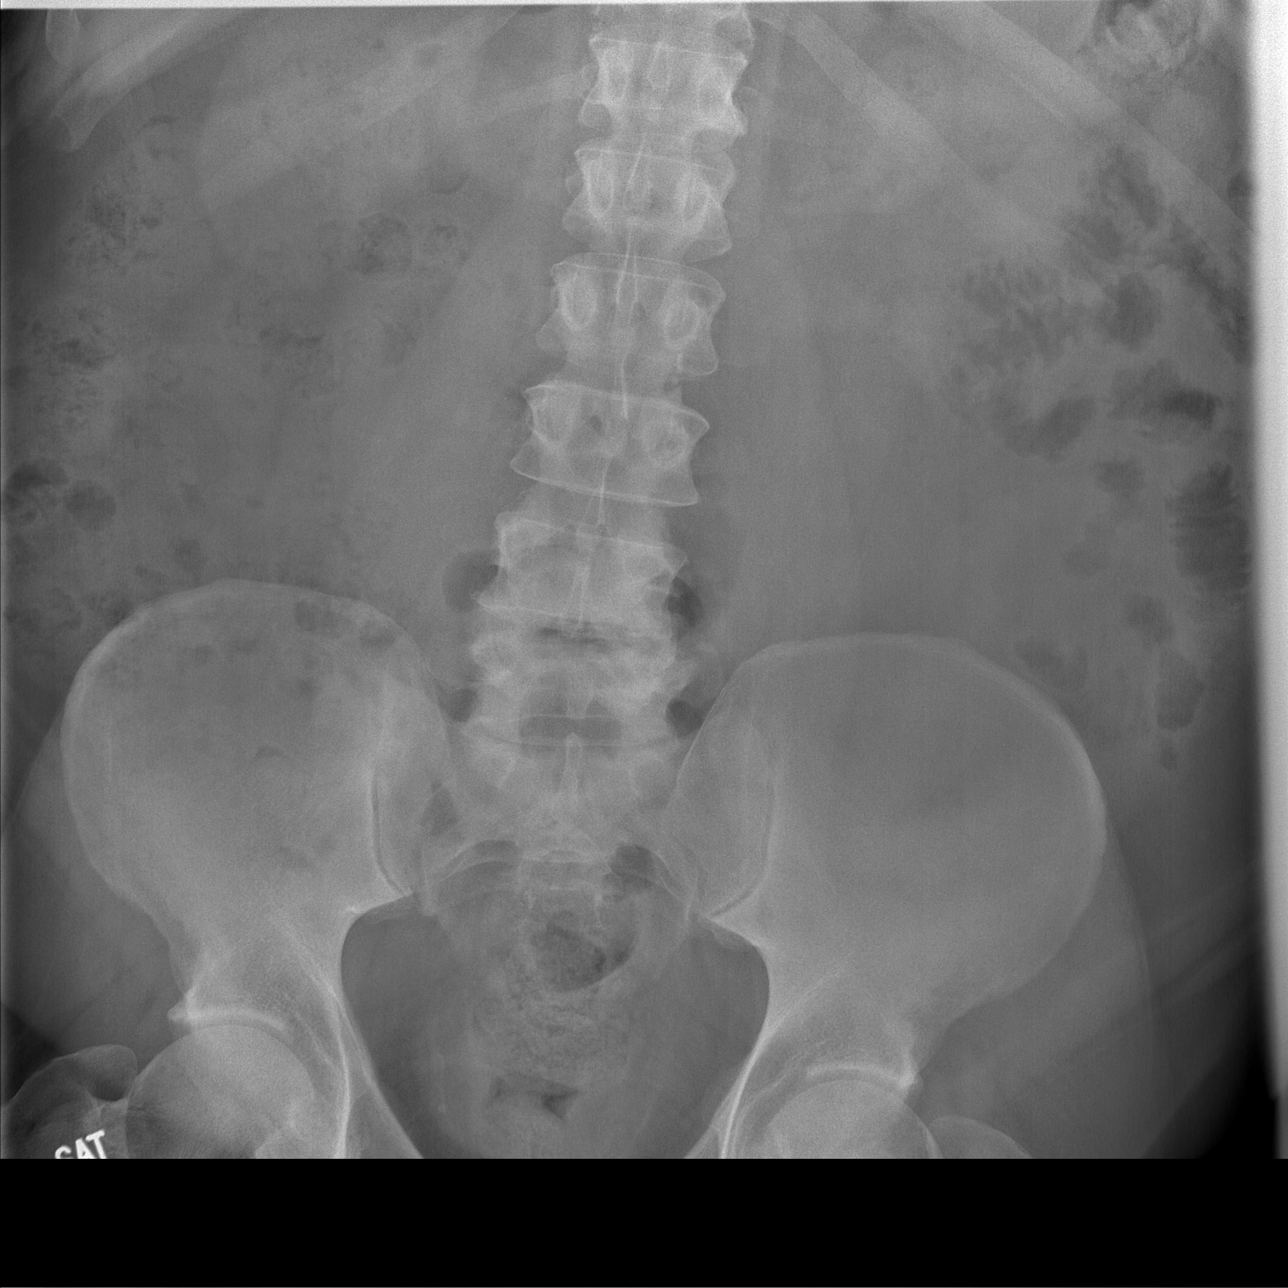

[t abdomen supine (2 of 3)]
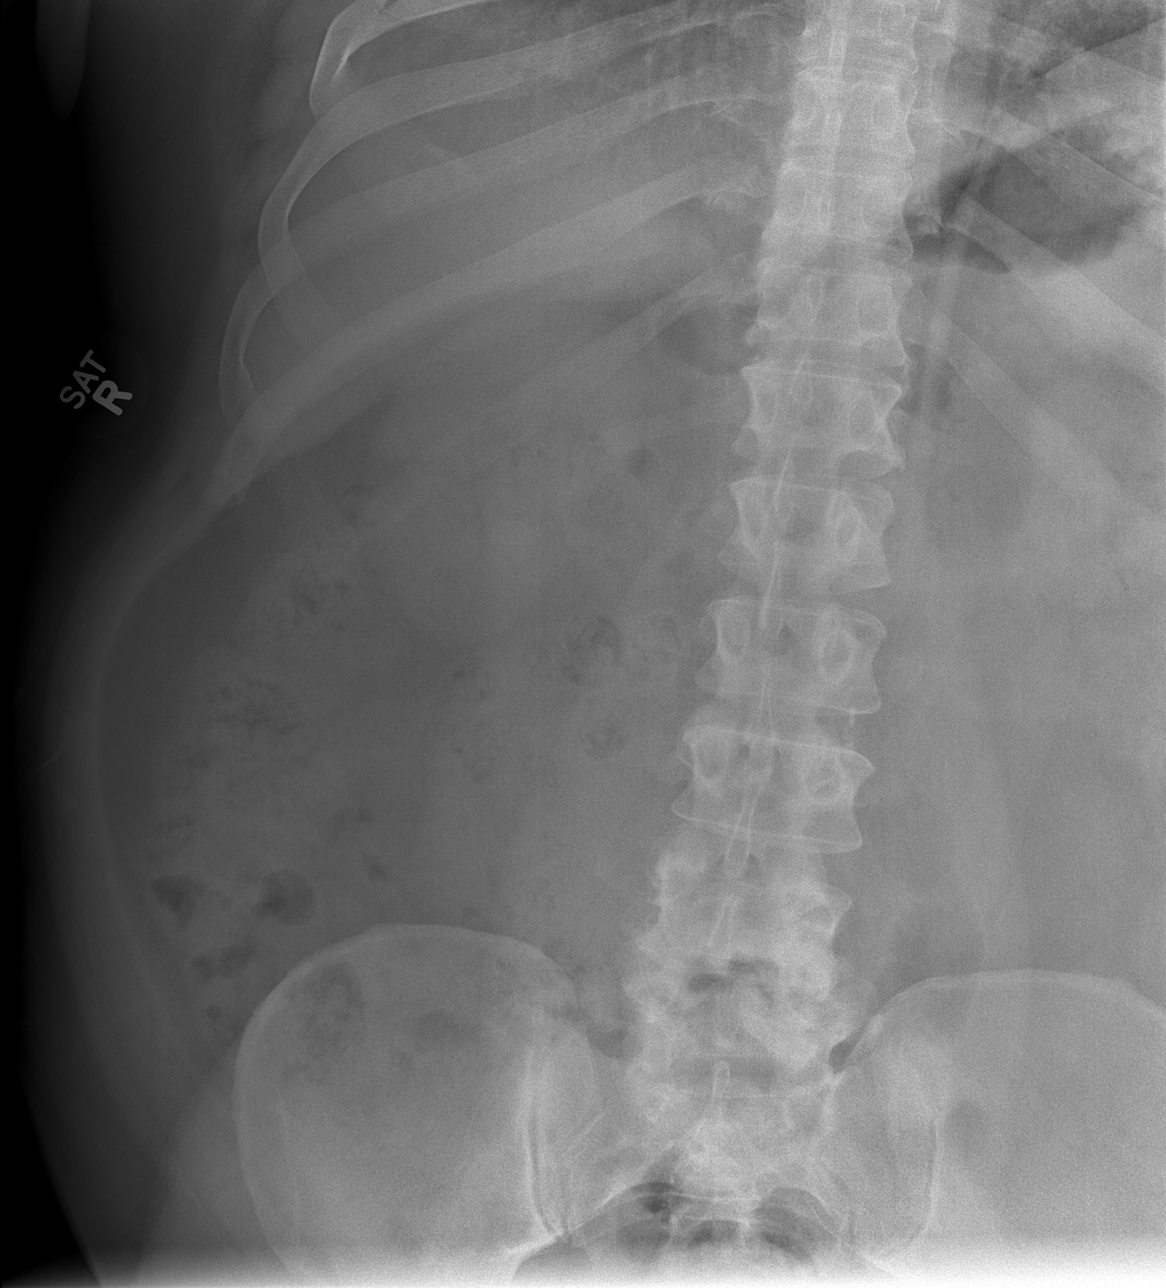

[t abdomen supine (3 of 3)]
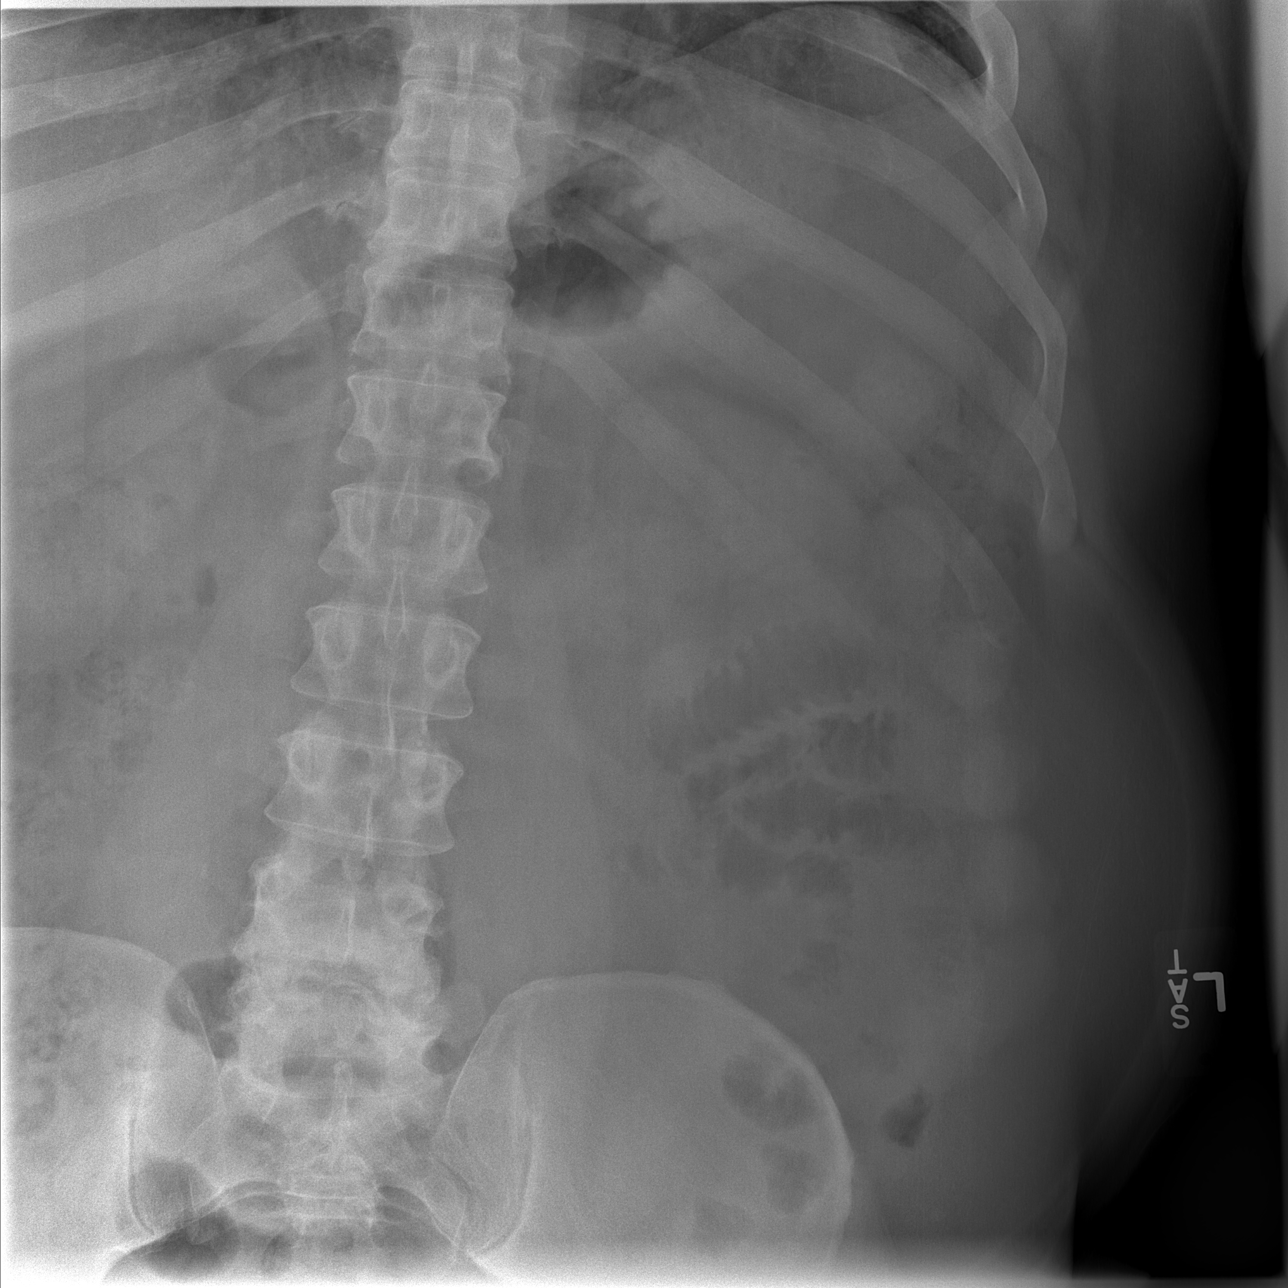

[3 of 3 positions shown; findings below may reference images not displayed]

FINDINGS: Normal bowel gas pattern.

Stool in rectum.

No bowel dilatation, bowel wall thickening, or free intraperitoneal
air.

Osseous structures unremarkable.

No urinary tract calcification.
IMPRESSION: Normal bowel gas pattern.

## 2015-11-09 DIAGNOSIS — I219 Acute myocardial infarction, unspecified: Secondary | ICD-10-CM

## 2015-11-09 HISTORY — DX: Acute myocardial infarction, unspecified: I21.9

## 2015-11-14 DIAGNOSIS — Z79899 Other long term (current) drug therapy: Secondary | ICD-10-CM | POA: Diagnosis not present

## 2015-11-14 DIAGNOSIS — T401X2A Poisoning by heroin, intentional self-harm, initial encounter: Secondary | ICD-10-CM | POA: Diagnosis not present

## 2015-11-14 DIAGNOSIS — I10 Essential (primary) hypertension: Secondary | ICD-10-CM | POA: Diagnosis not present

## 2015-11-14 DIAGNOSIS — T405X1A Poisoning by cocaine, accidental (unintentional), initial encounter: Secondary | ICD-10-CM | POA: Diagnosis not present

## 2015-11-14 DIAGNOSIS — T401X1A Poisoning by heroin, accidental (unintentional), initial encounter: Secondary | ICD-10-CM | POA: Diagnosis not present

## 2015-11-15 DIAGNOSIS — Z131 Encounter for screening for diabetes mellitus: Secondary | ICD-10-CM | POA: Diagnosis not present

## 2015-11-15 DIAGNOSIS — F17200 Nicotine dependence, unspecified, uncomplicated: Secondary | ICD-10-CM | POA: Insufficient documentation

## 2015-11-25 ENCOUNTER — Emergency Department (HOSPITAL_COMMUNITY): Payer: Medicare Other

## 2015-11-25 ENCOUNTER — Observation Stay (HOSPITAL_COMMUNITY)
Admission: EM | Admit: 2015-11-25 | Discharge: 2015-11-29 | Disposition: A | Discharge status: Home / Self Care | Payer: Medicare Other | Attending: Internal Medicine | Admitting: Internal Medicine

## 2015-11-25 ENCOUNTER — Encounter (HOSPITAL_COMMUNITY): Payer: Self-pay | Admitting: Emergency Medicine

## 2015-11-25 DIAGNOSIS — F314 Bipolar disorder, current episode depressed, severe, without psychotic features: Secondary | ICD-10-CM | POA: Diagnosis not present

## 2015-11-25 DIAGNOSIS — R002 Palpitations: Secondary | ICD-10-CM | POA: Diagnosis present

## 2015-11-25 DIAGNOSIS — Z5900 Homelessness unspecified: Secondary | ICD-10-CM

## 2015-11-25 DIAGNOSIS — R740 Nonspecific elevation of levels of transaminase and lactic acid dehydrogenase [LDH]: Secondary | ICD-10-CM

## 2015-11-25 DIAGNOSIS — K219 Gastro-esophageal reflux disease without esophagitis: Secondary | ICD-10-CM | POA: Insufficient documentation

## 2015-11-25 DIAGNOSIS — I248 Other forms of acute ischemic heart disease: Secondary | ICD-10-CM | POA: Diagnosis not present

## 2015-11-25 DIAGNOSIS — I10 Essential (primary) hypertension: Secondary | ICD-10-CM | POA: Diagnosis present

## 2015-11-25 DIAGNOSIS — F1910 Other psychoactive substance abuse, uncomplicated: Secondary | ICD-10-CM | POA: Diagnosis present

## 2015-11-25 DIAGNOSIS — I471 Supraventricular tachycardia, unspecified: Secondary | ICD-10-CM

## 2015-11-25 DIAGNOSIS — I25110 Atherosclerotic heart disease of native coronary artery with unstable angina pectoris: Secondary | ICD-10-CM | POA: Diagnosis not present

## 2015-11-25 DIAGNOSIS — I129 Hypertensive chronic kidney disease with stage 1 through stage 4 chronic kidney disease, or unspecified chronic kidney disease: Secondary | ICD-10-CM | POA: Insufficient documentation

## 2015-11-25 DIAGNOSIS — F17210 Nicotine dependence, cigarettes, uncomplicated: Secondary | ICD-10-CM | POA: Diagnosis not present

## 2015-11-25 DIAGNOSIS — R0789 Other chest pain: Secondary | ICD-10-CM | POA: Diagnosis not present

## 2015-11-25 DIAGNOSIS — Z66 Do not resuscitate: Secondary | ICD-10-CM | POA: Insufficient documentation

## 2015-11-25 DIAGNOSIS — M549 Dorsalgia, unspecified: Secondary | ICD-10-CM | POA: Diagnosis not present

## 2015-11-25 DIAGNOSIS — Z23 Encounter for immunization: Secondary | ICD-10-CM | POA: Insufficient documentation

## 2015-11-25 DIAGNOSIS — R079 Chest pain, unspecified: Secondary | ICD-10-CM | POA: Diagnosis not present

## 2015-11-25 DIAGNOSIS — N400 Enlarged prostate without lower urinary tract symptoms: Secondary | ICD-10-CM | POA: Diagnosis not present

## 2015-11-25 DIAGNOSIS — F1420 Cocaine dependence, uncomplicated: Secondary | ICD-10-CM | POA: Insufficient documentation

## 2015-11-25 DIAGNOSIS — G8929 Other chronic pain: Secondary | ICD-10-CM | POA: Diagnosis not present

## 2015-11-25 DIAGNOSIS — B1920 Unspecified viral hepatitis C without hepatic coma: Secondary | ICD-10-CM | POA: Diagnosis present

## 2015-11-25 DIAGNOSIS — R778 Other specified abnormalities of plasma proteins: Secondary | ICD-10-CM | POA: Diagnosis present

## 2015-11-25 DIAGNOSIS — R9439 Abnormal result of other cardiovascular function study: Secondary | ICD-10-CM

## 2015-11-25 DIAGNOSIS — B182 Chronic viral hepatitis C: Secondary | ICD-10-CM | POA: Insufficient documentation

## 2015-11-25 DIAGNOSIS — R7989 Other specified abnormal findings of blood chemistry: Secondary | ICD-10-CM | POA: Diagnosis present

## 2015-11-25 DIAGNOSIS — F319 Bipolar disorder, unspecified: Secondary | ICD-10-CM

## 2015-11-25 DIAGNOSIS — N189 Chronic kidney disease, unspecified: Secondary | ICD-10-CM | POA: Diagnosis not present

## 2015-11-25 DIAGNOSIS — K5900 Constipation, unspecified: Secondary | ICD-10-CM

## 2015-11-25 HISTORY — DX: Dependence on other enabling machines and devices: Z99.89

## 2015-11-25 HISTORY — DX: Insomnia, unspecified: G47.00

## 2015-11-25 HISTORY — DX: Obstructive sleep apnea (adult) (pediatric): G47.33

## 2015-11-25 HISTORY — DX: Headache: R51

## 2015-11-25 HISTORY — DX: Post-traumatic stress disorder, unspecified: F43.10

## 2015-11-25 HISTORY — DX: Acute myocardial infarction, unspecified: I21.9

## 2015-11-25 HISTORY — DX: Headache, unspecified: R51.9

## 2015-11-25 LAB — RAPID URINE DRUG SCREEN, HOSP PERFORMED
Amphetamines: NOT DETECTED
Barbiturates: NOT DETECTED
Benzodiazepines: NOT DETECTED
Cocaine: NOT DETECTED
Opiates: NOT DETECTED
Tetrahydrocannabinol: NOT DETECTED

## 2015-11-25 LAB — COMPREHENSIVE METABOLIC PANEL
ALT: 79 U/L — ABNORMAL HIGH (ref 17–63)
AST: 52 U/L — ABNORMAL HIGH (ref 15–41)
Albumin: 3.7 g/dL (ref 3.5–5.0)
Alkaline Phosphatase: 48 U/L (ref 38–126)
Anion gap: 8 (ref 5–15)
BUN: 11 mg/dL (ref 6–20)
CO2: 19 mmol/L — ABNORMAL LOW (ref 22–32)
Calcium: 9 mg/dL (ref 8.9–10.3)
Chloride: 112 mmol/L — ABNORMAL HIGH (ref 101–111)
Creatinine, Ser: 1.21 mg/dL (ref 0.61–1.24)
GFR calc Af Amer: >60 mL/min (ref >60)
GFR calc non Af Amer: >60 mL/min (ref >60)
Glucose, Bld: 130 mg/dL — ABNORMAL HIGH (ref 65–99)
Potassium: 4 mmol/L (ref 3.5–5.1)
Sodium: 139 mmol/L (ref 135–145)
Total Bilirubin: 0.6 mg/dL (ref 0.3–1.2)
Total Protein: 6.2 g/dL — ABNORMAL LOW (ref 6.5–8.1)

## 2015-11-25 LAB — CBC WITH DIFFERENTIAL/PLATELET
Basophils Absolute: 0 10*3/uL (ref 0.0–0.1)
Basophils Relative: 0 %
Eosinophils Absolute: 0.1 10*3/uL (ref 0.0–0.7)
Eosinophils Relative: 2 %
HCT: 42.4 % (ref 39.0–52.0)
Hemoglobin: 13.8 g/dL (ref 13.0–17.0)
Lymphocytes Relative: 20 %
Lymphs Abs: 1.4 10*3/uL (ref 0.7–4.0)
MCH: 28.4 pg (ref 26.0–34.0)
MCHC: 32.5 g/dL (ref 30.0–36.0)
MCV: 87.2 fL (ref 78.0–100.0)
Monocytes Absolute: 0.5 10*3/uL (ref 0.1–1.0)
Monocytes Relative: 7 %
Neutro Abs: 5.2 10*3/uL (ref 1.7–7.7)
Neutrophils Relative %: 71 %
Platelets: 275 10*3/uL (ref 150–400)
RBC: 4.86 MIL/uL (ref 4.22–5.81)
RDW: 14.5 % (ref 11.5–15.5)
WBC: 7.3 10*3/uL (ref 4.0–10.5)

## 2015-11-25 LAB — I-STAT CHEM 8, ED
BUN: 13 mg/dL (ref 6–20)
Calcium, Ion: 1.12 mmol/L — ABNORMAL LOW (ref 1.13–1.30)
Chloride: 107 mmol/L (ref 101–111)
Creatinine, Ser: 1.1 mg/dL (ref 0.61–1.24)
Glucose, Bld: 130 mg/dL — ABNORMAL HIGH (ref 65–99)
HCT: 42 % (ref 39.0–52.0)
Hemoglobin: 14.3 g/dL (ref 13.0–17.0)
Potassium: 4.1 mmol/L (ref 3.5–5.1)
Sodium: 144 mmol/L (ref 135–145)
TCO2: 21 mmol/L (ref 0–100)

## 2015-11-25 LAB — TROPONIN I
Troponin I: <0.03 ng/mL (ref <0.03)
Troponin I: 0.06 ng/mL (ref <0.03)
Troponin I: 0.04 ng/mL (ref <0.03)

## 2015-11-25 LAB — URINALYSIS, ROUTINE W REFLEX MICROSCOPIC
Bilirubin Urine: NEGATIVE
Glucose, UA: NEGATIVE mg/dL
Hgb urine dipstick: NEGATIVE
Ketones, ur: NEGATIVE mg/dL
Leukocytes, UA: NEGATIVE
Nitrite: NEGATIVE
Protein, ur: NEGATIVE mg/dL
Specific Gravity, Urine: 1.012 (ref 1.005–1.030)
pH: 7 (ref 5.0–8.0)

## 2015-11-25 LAB — I-STAT TROPONIN, ED: Troponin i, poc: 0.02 ng/mL (ref 0.00–0.08)

## 2015-11-25 MED ORDER — LAMOTRIGINE 25 MG PO TABS
25.0000 mg | ORAL_TABLET | Freq: Every day | ORAL | Status: DC
  Administered 2015-11-26 – 2015-11-29 (×4): 25 mg via ORAL
  Filled 2015-11-25 (×4): qty 1

## 2015-11-25 MED ORDER — AMANTADINE HCL 100 MG PO CAPS
100.0000 mg | ORAL_CAPSULE | Freq: Every day | ORAL | Status: DC
  Administered 2015-11-25 – 2015-11-29 (×4): 100 mg via ORAL
  Filled 2015-11-25 (×6): qty 1

## 2015-11-25 MED ORDER — TAMSULOSIN HCL 0.4 MG PO CAPS
0.4000 mg | ORAL_CAPSULE | Freq: Every day | ORAL | Status: DC
  Administered 2015-11-26 – 2015-11-29 (×4): 0.4 mg via ORAL
  Filled 2015-11-25 (×4): qty 1

## 2015-11-25 MED ORDER — PANTOPRAZOLE SODIUM 40 MG PO TBEC
40.0000 mg | DELAYED_RELEASE_TABLET | Freq: Every day | ORAL | Status: DC
  Administered 2015-11-26 – 2015-11-29 (×4): 40 mg via ORAL
  Filled 2015-11-25 (×4): qty 1

## 2015-11-25 MED ORDER — HYDROXYZINE HCL 25 MG PO TABS: 25.0000 mg | ORAL_TABLET | Freq: Three times a day (TID) | ORAL | Status: DC | PRN

## 2015-11-25 MED ORDER — DICLOFENAC SODIUM 50 MG PO TBEC
50.0000 mg | DELAYED_RELEASE_TABLET | Freq: Three times a day (TID) | ORAL | Status: DC
  Administered 2015-11-25 – 2015-11-29 (×10): 50 mg via ORAL
  Filled 2015-11-25 (×15): qty 1

## 2015-11-25 MED ORDER — PNEUMOCOCCAL VAC POLYVALENT 25 MCG/0.5ML IJ INJ
0.5000 mL | INJECTION | INTRAMUSCULAR | Status: DC
  Filled 2015-11-25: qty 0.5

## 2015-11-25 MED ORDER — GI COCKTAIL ~~LOC~~
30.0000 mL | Freq: Four times a day (QID) | ORAL | Status: DC | PRN
  Administered 2015-11-27 – 2015-11-28 (×2): 30 mL via ORAL
  Filled 2015-11-25 (×3): qty 30

## 2015-11-25 MED ORDER — TRAZODONE HCL 150 MG PO TABS
150.0000 mg | ORAL_TABLET | Freq: Every evening | ORAL | Status: DC | PRN
  Administered 2015-11-25 – 2015-11-28 (×2): 150 mg via ORAL
  Filled 2015-11-25 (×2): qty 1

## 2015-11-25 MED ORDER — LISINOPRIL 20 MG PO TABS
20.0000 mg | ORAL_TABLET | Freq: Every day | ORAL | Status: DC
  Administered 2015-11-26 – 2015-11-29 (×4): 20 mg via ORAL
  Filled 2015-11-25 (×4): qty 1

## 2015-11-25 MED ORDER — ONDANSETRON HCL 4 MG/2ML IJ SOLN: 4.0000 mg | Freq: Four times a day (QID) | INTRAMUSCULAR | Status: DC | PRN

## 2015-11-25 MED ORDER — ENOXAPARIN SODIUM 40 MG/0.4ML ~~LOC~~ SOLN
40.0000 mg | SUBCUTANEOUS | Status: DC
  Administered 2015-11-25 – 2015-11-28 (×4): 40 mg via SUBCUTANEOUS
  Filled 2015-11-25 (×4): qty 0.4

## 2015-11-25 MED ORDER — SERTRALINE HCL 100 MG PO TABS
100.0000 mg | ORAL_TABLET | Freq: Every day | ORAL | Status: DC
  Administered 2015-11-26 – 2015-11-29 (×4): 100 mg via ORAL
  Filled 2015-11-25 (×4): qty 1

## 2015-11-25 MED ORDER — GABAPENTIN 300 MG PO CAPS
300.0000 mg | ORAL_CAPSULE | Freq: Three times a day (TID) | ORAL | Status: DC
  Administered 2015-11-25 – 2015-11-29 (×12): 300 mg via ORAL
  Filled 2015-11-25 (×12): qty 1

## 2015-11-25 MED ORDER — SENNOSIDES-DOCUSATE SODIUM 8.6-50 MG PO TABS
2.0000 | ORAL_TABLET | Freq: Every day | ORAL | Status: DC
  Administered 2015-11-25 – 2015-11-28 (×4): 2 via ORAL
  Filled 2015-11-25 (×4): qty 2

## 2015-11-25 MED ORDER — LORAZEPAM 2 MG/ML IJ SOLN
INTRAMUSCULAR | Status: AC
  Filled 2015-11-25: qty 1

## 2015-11-25 MED ORDER — METOPROLOL TARTRATE 25 MG PO TABS
25.0000 mg | ORAL_TABLET | Freq: Two times a day (BID) | ORAL | Status: DC
  Administered 2015-11-25 – 2015-11-26 (×2): 25 mg via ORAL
  Filled 2015-11-25 (×2): qty 1

## 2015-11-25 MED ORDER — LORATADINE 10 MG PO TABS
10.0000 mg | ORAL_TABLET | Freq: Every day | ORAL | Status: DC
  Administered 2015-11-26 – 2015-11-29 (×4): 10 mg via ORAL
  Filled 2015-11-25 (×4): qty 1

## 2015-11-25 MED ORDER — IOPAMIDOL (ISOVUE-370) INJECTION 76%
INTRAVENOUS | Status: AC
  Administered 2015-11-25: 100 mL via INTRAVENOUS
  Filled 2015-11-25: qty 100

## 2015-11-25 MED ORDER — ACETAMINOPHEN 325 MG PO TABS
650.0000 mg | ORAL_TABLET | ORAL | Status: DC | PRN
  Administered 2015-11-27 – 2015-11-28 (×3): 650 mg via ORAL
  Filled 2015-11-25 (×3): qty 2

## 2015-11-25 MED ORDER — LORAZEPAM 2 MG/ML IJ SOLN
0.5000 mg | Freq: Once | INTRAMUSCULAR | Status: AC
  Administered 2015-11-25: 0.5 mg via INTRAVENOUS

## 2015-11-25 MED ORDER — QUETIAPINE FUMARATE 25 MG PO TABS
25.0000 mg | ORAL_TABLET | Freq: Every day | ORAL | Status: DC
  Administered 2015-11-26 – 2015-11-29 (×4): 25 mg via ORAL
  Filled 2015-11-25 (×4): qty 1

## 2015-11-25 MED ORDER — DIVALPROEX SODIUM ER 500 MG PO TB24
500.0000 mg | ORAL_TABLET | Freq: Every day | ORAL | Status: DC
  Administered 2015-11-26 – 2015-11-29 (×4): 500 mg via ORAL
  Filled 2015-11-25 (×4): qty 1

## 2015-11-25 MED ORDER — POLYETHYLENE GLYCOL 3350 17 G PO PACK
17.0000 g | PACK | Freq: Every day | ORAL | Status: DC
  Administered 2015-11-26 – 2015-11-28 (×2): 17 g via ORAL
  Filled 2015-11-25 (×3): qty 1

## 2015-11-25 MED ORDER — QUETIAPINE FUMARATE 25 MG PO TABS
100.0000 mg | ORAL_TABLET | Freq: Every day | ORAL | Status: DC
  Administered 2015-11-25 – 2015-11-28 (×4): 100 mg via ORAL
  Filled 2015-11-25 (×4): qty 4

## 2015-11-25 MED ORDER — ENSURE ENLIVE PO LIQD
237.0000 mL | Freq: Two times a day (BID) | ORAL | Status: DC
  Administered 2015-11-26 – 2015-11-29 (×6): 237 mL via ORAL

## 2015-11-25 NOTE — ED Provider Notes (Signed)
I saw and evaluated the patient, reviewed the resident's note and I agree with the findings and plan. If applicable, I agree with the resident's interpretation of the EKG.  If applicable, I was present for critical portions of any procedures performed.  Patient presents by EMS with substernal chest pain onset at 7:30 AM. Does not radiate. Was associated with shortness of breath, nausea, diaphoresis. Found to be in SVT at 150s by EMS with diffuse ST depressions. Cardioverted with 6 mg of adenosine. Continues to have chest pain but ST changes have resolved. Denies any cardiac history. States he was admitted to [facility] 2 weeks ago and told he had a heart attack but did not have a catheterization or stress test. Details are unclear.  Patient appears anxious, he Is diaphoretic and cold and clammy. EKG shows diffuse J-point elevation similar to his previous. Discussed with Dr. Katrinka Blazing who agrees this does not meet STEMI criteria.  Equal distal pulses.CTAB, RRR.  Troponin negative. CP has improved.  No dissection or PE on CT. Will admit for chest pain r/o.   Glynn Octave, MD 11/25/15 1710

## 2015-11-25 NOTE — ED Notes (Signed)
RN attempted report 

## 2015-11-25 NOTE — ED Notes (Signed)
Pt reports pain has improved and is feeling less SOB

## 2015-11-25 NOTE — Progress Notes (Addendum)
CRITICAL VALUE ALERT  Critical value received: troponin 0.06  Date of notification:  11/25/15  Time of notification:  1639  Critical value read back: yes  Nurse who received alert:  Johna Roles, RN  MD notified (1st page):  Heide Spark  Time of first page:  1642  MD notified (2nd page):  Time of second page:  Responding MD:    Time MD responded:  [phone]

## 2015-11-25 NOTE — ED Notes (Signed)
Called CT to prioritize patient.

## 2015-11-25 NOTE — ED Notes (Signed)
MD at bedside. 

## 2015-11-25 NOTE — ED Notes (Signed)
Patient request ice cream RN ordered.

## 2015-11-25 NOTE — ED Notes (Signed)
Patient transported to CT 

## 2015-11-25 NOTE — H&P (Signed)
Date: 11/25/2015               Patient Name:  [name] MRN: [id number]  DOB: [birth year] Age / Sex: 55 y.o., male   PCP: [facility]         Medical Service: Internal Medicine Teaching Service         Attending Physician: Dr. Earl Lagos, MD    First Contact: Dr. Ladona Ridgel Pager: [phone]  Second Contact: Dr. Tasia Catchings Pager: [phone]       After Hours (After 5p/  First Contact Pager: [phone]  weekends / holidays): Second Contact Pager: [phone]   Chief Complaint: palpitations and chest pain  History of Present Illness: [name] is a 55 year old man with history of tobacco, alcohol and cocaine abuse, Bipolar disorder, arthritis, CKD, GERD, hepatitis C, HTN, presenting with palpitations and chest pain. Got out of hospital 3 days from [facility] for depression (had SI/HI, mother passed away in 09/22/22, fiance passed away a year ago, child passed away from SIDS 2 years ago). Has been eating poorly and not sleeping well since then. His abdomen felt more distended since yesterday. He has abdominal pain (periumbilical, tender, lying improves). Last had a BM this morning and feels constipated. No diarrhea. Some streaks of blood similar to previous related to hemorrhoids. No hematochezia or melena otherwise. He was walking to [facility] this morning and vomited. After this he started having diaphoresis and palpitations. He felt dizzy and dyspneic. EMS was called.   He has midsternal chest pain that started this morning before palpitations. Felt like "heart was jumping out of chest" and throbbing. Radiated down into stomach. It was constant. Pleuritic. Did not improve with leaning over.   2 weeks ago, had similar symptoms and was seen in hospital at [facility]. He is unable to recall the name but there is an ED visit at [facility]. Had palpitations at the time with diaphoresis. No abdominal distention at the time. No interventions were done. Per EMR, he  reported he did cocaine and heroin. He was given Narcan by girlfriend and EMS. He was observed then discharged.  Denies dysuria or hematuria. Marland Kitchen He was supposed to pick up a medication (Macrobid) this morning for urinary tract infection. While he was at [facility], he had malodorous urine and was found to have UTI. Reports subjective fevers/chills for past week. No vision changes. He has cough productive of green mucous. No rash, bleeding/bruising, edema, changes to chronic arthralgias, myalgias, paresthesias, or weakness.  Upon evaluation by EMS he was found to be in SVT at 150s with diffuse ST depressions. Cardioverted with 6 mg of adenosine.   Meds:  Current Meds  Medication Sig  . amantadine (SYMMETREL) 100 MG capsule Take 100 mg by mouth daily.   . cetirizine (ZYRTEC) 10 MG tablet Take 10 mg by mouth daily.  . diclofenac (VOLTAREN) 50 MG EC tablet Take 50 mg by mouth 3 (three) times daily.  . divalproex (DEPAKOTE ER) 500 MG 24 hr tablet Take 500 mg by mouth daily.  Marland Kitchen gabapentin (NEURONTIN) 300 MG capsule Take 1 capsule (300 mg total) by mouth 3 (three) times daily.  . hydrOXYzine (ATARAX/VISTARIL) 25 MG tablet Take 1 tablet (25 mg total) by mouth 3 (three) times daily as needed for anxiety.  . lamoTRIgine (LAMICTAL) 25 MG tablet Take 25 mg by mouth daily.  Marland Kitchen lisinopril (PRINIVIL,ZESTRIL) 20 MG tablet Take 20 mg by mouth daily.  Marland Kitchen  metoprolol tartrate (LOPRESSOR) 25 MG tablet Take 25 mg by mouth 2 (two) times daily.  Marland Kitchen omeprazole (PRILOSEC) 20 MG capsule Take 20 mg by mouth daily.  . pantoprazole (PROTONIX) 40 MG tablet Take 1 tablet (40 mg total) by mouth daily.  . QUEtiapine (SEROQUEL) 100 MG tablet Take 100 mg by mouth at bedtime.  Marland Kitchen QUEtiapine (SEROQUEL) 25 MG tablet Take 25 mg by mouth daily.  . sertraline (ZOLOFT) 100 MG tablet Take 100 mg by mouth daily.  . tamsulosin (FLOMAX) 0.4 MG CAPS capsule Take 1 capsule (0.4 mg total) by mouth daily.  . traZODone (DESYREL) 100 MG tablet  Take 1 tablet (100 mg total) by mouth at bedtime as needed for sleep. (Patient taking differently: Take 150 mg by mouth at bedtime. )  . zolpidem (AMBIEN) 10 MG tablet Take 10 mg by mouth at bedtime.     Allergies: Allergies as of 11/25/2015  . (No Known Allergies)   Past Medical History:  Diagnosis Date  . Alcohol abuse   . Anxiety   . Arthritis    per pt rheumatoid   . Arthritis   . Back pain, chronic   . Bipolar 1 disorder (HCC)   . Bipolar affective disorder (HCC)   . Chronic back pain   . Chronic kidney disease   . Cocaine abuse    smokes crack-cocaine  . Depression   . GERD (gastroesophageal reflux disease)   . Gout   . Hemorrhoids   . Hepatitis C   . Hypertension   . Tobacco abuse   . Vitamin D deficiency     Family History: Parents are deceased. No CAD in family. Maternal grandfather had throat cancer.  Social History: Smokes 1 pack per week. Smoking since age 44. Previously had alcohol abuse. Last had alcohol 6 months ago. Previously used crack cocaine - last used 3 weeks ago. Marijuana last used 6 months ago. Lives in shelter presently.  Review of Systems: A complete ROS was negative except as per HPI.   Physical Exam: Blood pressure 131/94, pulse 85, temperature 97.6 F (36.4 C), temperature source Oral, resp. rate 18, height 5\' 2"  (1.575 m), weight 108.9 kg (240 lb), SpO2 99 %. General Apperance: NAD Head: Normocephalic, atraumatic Eyes: PERRL, EOMI, anicteric sclera Ears: Normal external ear canal Nose: Nares normal, septum midline, mucosa normal Throat: Lips, mucosa and tongue normal  Neck: Supple, trachea midline Back: No tenderness or bony abnormality  Lungs: Clear to auscultation bilaterally. No wheezes, rhonchi or rales. Breathing comfortably Chest Wall: Nontender, no deformity Heart: Regular rate and rhythm, no murmur/rub/gallop Abdomen: Soft, nontender, nondistended, no rebound/guarding Extremities: Normal, atraumatic, warm and well perfused,  no edema Pulses: 2+ throughout Skin: No rashes or lesions Neurologic: Alert and oriented x 3. CNII-XII intact. Normal strength and sensation  Labs CMP Latest Ref Rng & Units 11/25/2015 11/25/2015 10/18/2015  Glucose 65 - 99 mg/dL 12/19/2015) 923(R) 007(M)  BUN 6 - 20 mg/dL 13 11 8   Creatinine 0.61 - 1.24 mg/dL 226(J 3.35  Sodium 135 - 145 mmol/L 144 139 140  Potassium 3.5 - 5.1 mmol/L 4.1 4.0 3.4(L)  Chloride 101 - 111 mmol/L 107 112(H) 106  CO2 22 - 32 mmol/L - 19(L) 27  Calcium 8.9 - 10.3 mg/dL - 9.0 9.3  Total Protein 6.5 - 8.1 g/dL - 6.2(L) -  Total Bilirubin 0.3 - 1.2 mg/dL - 0.6 -  Alkaline Phos 38 - 126 U/L - 48 -  AST 15 - 41 U/L - 52(H) -  ALT 17 - 63 U/L - 79(H) -   Cardiac Panel (last 3 results)  Recent Labs  11/25/15 0900 11/25/15 1528  TROPONINI <0.03 0.06*    EKG: NSR, T wave inversion in V1. No changes compared to previous EKG.  CXR: No acute cardiopulmonary abnormality.  CT angiogram chest: There is no demonstrable thoracic aortic aneurysm or dissection. No pulmonary embolus. There are foci of coronary artery calcification.  There are areas of patchy atelectasis. There may be a slight degree of interstitial edema in the bases. There is no airspace consolidation. No adenopathy.  CT angiogram abdomen and pelvis: No abdominal aortic or majorarterial vessel aneurysm or dissection in the abdomen or pelvis. Mild atherosclerotic calcification and plaque in the distal aorta without hemodynamically significant obstruction. A few scattered foci of calcification in pelvic arterial vessels without appreciable vessel narrowing. Major mesenteric vessels appear widely patent.  Study otherwise essentially unremarkable. No inflammatory focus or abnormal fluid collection. Appendix appears normal. No bowel obstruction or abscess.  Assessment & Plan by Problem: 55 year old man with history of tobacco, alcohol and cocaine abuse, Bipolar disorder, arthritis, CKD, GERD, hepatitis  C, HTN, presenting with palpitations and chest pain.  Chest pain: The description of his chest pain sounds like it was moreso related to his palpitations. Afebrile with no leukocytosis. Initial EKG by EMS with ST depressions which have resolved. No acute ischemic changes on EKG in ED. Initial troponin negative. CXR with no acute cardiopulmonary process. UDS negative. CTA chest/abdomen/pelvis with negative aneurysm/dissection and no PE. -repeat EKG in AM -trend troponins -lipid panel in AM -hemoglobin A1c  -tele -2D echo  SVT: Resolved now.  Constipation: Miralax daily and Senokot-S QHS.  ?UTI: UA negative and he denies dysuria. Discontinue antibiotics.  HTN: Continue home lisinopril, metoprolol BID  Mild transaminitis: AST 52 and ALT 79. Bilirubin normal. History of chronic hepatitis C. Not yet treated. This is improved compared to previous measurement of 64 and 79 respectively in June.  BPH: Continue Flomax daily.  Chronic back pain: Continue home voltaren TID, gabapentin  Bipolar d/o: Continue home Depakote, Lamictal, Seroquel, Zoloft  GERD: Continue home Protonix  FEN: HH VTE ppx: subq Lovenox Code status: He has an advanced directive that is filed with the [name]. He confirms that he is DNR.  Dispo: Admit patient to Observation with expected length of stay less than 2 midnights.  Signed: Lora Paula, MD 11/25/2015, 11:08 AM  Pager: [phone]

## 2015-11-25 NOTE — ED Provider Notes (Signed)
[facility]-EMERGENCY DEPT Provider Note   CSN: [id number] Arrival date & time: 11/25/15  0830     History   Chief Complaint Chief Complaint  Patient presents with  . Chest Pain    HPI [name] is a 55 y.o. male.  HPI  Patient with a history of Bipolar, Anxiety, ETOH and cocaine use, heroin use, presents for chest pain.  He reports sudden onset of diaphoresis, palpitations and then chest pain while at a doctors office this morning.  It is accompanied by SOB and Nausea.  EMS found him to be in SVT with a rate in the 160s and soft pressures, so they converted him with 6mg  Adenosine x1.  He reports that the pain is currently 8/10, central chest, and in the abdomen.  He states he had a heart attack in 3 weeks ago, and this feels the same.  Records show he was seen at [facility] for Heroin overdose (requiring narcan) with cocaine use, and there is no mention of MI.  Past Medical History:  Diagnosis Date  . Alcohol abuse   . Anxiety   . Arthritis    per pt rheumatoid   . Arthritis   . Back pain, chronic   . Bipolar 1 disorder (HCC)   . Bipolar affective disorder (HCC)   . Chronic back pain   . Chronic kidney disease   . Cocaine abuse    smokes crack-cocaine  . Depression   . GERD (gastroesophageal reflux disease)   . Gout   . Hemorrhoids   . Hepatitis C   . Hypertension   . Tobacco abuse   . Vitamin D deficiency     Patient Active Problem List   Diagnosis Date Noted  . GERD (gastroesophageal reflux disease)   . Bipolar 1 disorder (HCC)   . Hepatitis C   . Back pain, chronic   . Chronic kidney disease   . Affective psychosis, bipolar (HCC)   . Alcohol use disorder, severe, dependence (HCC)   . Cocaine use disorder, severe, dependence (HCC)   . Generalized abdominal pain   . Bipolar I disorder, most recent episode depressed (HCC)   . Bipolar disorder, current episode depressed, severe, without psychotic features (HCC)   . Bipolar affective disorder, depressed,  severe (HCC) 03/26/2014  . Right hip pain   . Medically noncompliant 03/23/2014  . Dysuria 03/22/2013  . Headache 03/22/2013  . Polysubstance dependence (HCC) 03/21/2012  . Anxiety   . Bipolar affective disorder (HCC)   . Polysubstance abuse   . Gout   . Vitamin D deficiency   . Hepatitis C     Past Surgical History:  Procedure Laterality Date  . KNEE SURGERY         Home Medications    Prior to Admission medications   Medication Sig Start Date End Date Taking? Authorizing Provider  amantadine (SYMMETREL) 100 MG capsule Take 100 mg by mouth 3 (three) times daily. 08/23/15 08/22/16  Historical Provider, MD  ARIPiprazole (ABILIFY) 10 MG tablet Take 1 tablet (10 mg total) by mouth daily. 02/01/15   02/03/15, FNP  cephALEXin (KEFLEX) 500 MG capsule Take 2 capsules (1,000 mg total) by mouth 2 (two) times daily. Patient not taking: Reported on 10/18/2015 09/25/15   09/27/15, MD  gabapentin (NEURONTIN) 300 MG capsule Take 1 capsule (300 mg total) by mouth 3 (three) times daily. 02/01/15   02/03/15, FNP  hydrOXYzine (ATARAX/VISTARIL) 25 MG tablet Take 1 tablet (25 mg total)  by mouth 3 (three) times daily as needed for anxiety. 02/01/15   Beau Fanny, FNP  lithium carbonate 150 MG capsule Take 3 capsules (450 mg total) by mouth 2 (two) times daily with a meal. 02/01/15   Beau Fanny, FNP  pantoprazole (PROTONIX) 40 MG tablet Take 1 tablet (40 mg total) by mouth daily. 02/01/15   Beau Fanny, FNP  sertraline (ZOLOFT) 100 MG tablet Take 100 mg by mouth daily. 08/23/15   Historical Provider, MD  tamsulosin (FLOMAX) 0.4 MG CAPS capsule Take 1 capsule (0.4 mg total) by mouth daily. 02/01/15   Beau Fanny, FNP  traZODone (DESYREL) 100 MG tablet Take 1 tablet (100 mg total) by mouth at bedtime as needed for sleep. 02/01/15   Beau Fanny, FNP    Family History Family History  Problem Relation Age of Onset  . Hypertension Brother   . Diabetes Brother   .  Hypertension Mother   . Diabetes Mother   . Arthritis Mother   . Other Sister     bone disease    Social History Social History  Substance Use Topics  . Smoking status: Former Games developer  . Smokeless tobacco: Not on file  . Alcohol use Yes     Comment: 24 oz--3x's wkly      Allergies   Review of patient's allergies indicates no known allergies.   Review of Systems Review of Systems  Constitutional: Positive for diaphoresis. Negative for chills and fever.  HENT: Negative for ear pain and sore throat.   Eyes: Negative for pain and visual disturbance.  Respiratory: Positive for shortness of breath. Negative for cough.   Cardiovascular: Positive for chest pain and palpitations.  Gastrointestinal: Positive for nausea and vomiting. Negative for abdominal pain.  Genitourinary: Negative for dysuria and hematuria.  Musculoskeletal: Negative for arthralgias and back pain.  Skin: Negative for color change and rash.  Neurological: Negative for seizures and syncope.  All other systems reviewed and are negative.    Physical Exam Updated Vital Signs Ht 5\' 2"  (1.575 m)   Wt 108.9 kg   BMI 43.90 kg/m   Physical Exam  Constitutional: He appears well-developed and well-nourished. He appears distressed.  HENT:  Head: Normocephalic and atraumatic.  Eyes: Conjunctivae are normal.  Neck: Neck supple.  Cardiovascular: Normal rate and regular rhythm.   No murmur heard. Pulmonary/Chest: Effort normal and breath sounds normal. No respiratory distress.  Abdominal: Soft. There is tenderness (mild / diffuse). There is no rebound and no guarding.  Musculoskeletal: He exhibits no edema.  Neurological: He is alert.  Skin: Skin is warm. He is diaphoretic.  Psychiatric: His mood appears anxious.  Nursing note and vitals reviewed.    ED Treatments / Results  Labs (all labs ordered are listed, but only abnormal results are displayed) Labs Reviewed  CBC WITH DIFFERENTIAL/PLATELET    COMPREHENSIVE METABOLIC PANEL  TROPONIN I  URINE RAPID DRUG SCREEN, HOSP PERFORMED  URINALYSIS, ROUTINE W REFLEX MICROSCOPIC (NOT AT [facility])  I-STAT CHEM 8, ED  I-STAT TROPOININ, ED    EKG  EKG Interpretation None       Radiology No results found.  Procedures Procedures (including critical care time)  Medications Ordered in ED Medications  LORazepam (ATIVAN) 2 MG/ML injection (not administered)  LORazepam (ATIVAN) injection 0.5 mg (0.5 mg Intravenous Given 11/25/15 0844)     Initial Impression / Assessment and Plan / ED Course  I have reviewed the triage vital signs and the nursing notes.  Pertinent labs & imaging results that were available during my care of the patient were reviewed by me and considered in my medical decision making (see chart for details).  Clinical Course   This patient presents with chest pain. EMS cardiac tracings reviewed, did show SVT at 160 with likely rate related ST depressions, and rhythm strip conversion to NSR. Patient received aspirin as well. Upon arrival patient appears moderately distressed, however is currently stable. EKG did not show signs of ischemic changes, was normal sinus rhythm.  Ddx: ACS vs Dissection vs PE vs anxiety vs cocaine induced chest pain.  Patient presents for evaluation of chest pain.  HEART Pathway score is 4 (moderate).  Troponin negative x1.  EKG is NSR without ischemic changes.  Feel like PNA unlikely given CXR without infiltrates or effusion, no leukocytosis, no fever.  No mediastinal widening on CXR.  Doubt emergent intraabdominal pathology given benign abdominal exam and no abdominal symptoms.  No signs of PE on Ct.  CT dissection protocol negative.  Patient admitted for CP rule out to internal med.  Cardiology also consulted.   Final Clinical Impressions(s) / ED Diagnoses   Final diagnoses:  None    New Prescriptions New Prescriptions   No medications on file     Marcelina Morel, MD 11/25/15  1401    Glynn Octave, MD 11/25/15 1711

## 2015-11-25 NOTE — Progress Notes (Signed)
Patient refused bed alarm. Will continue to monitor.  

## 2015-11-25 NOTE — ED Triage Notes (Signed)
Woke at 0730 with substernal chest pain; diaphoretic. 160 HR per EMS; converted with Adenosine 6mg . Aspirin 325 mg given. 800 saline given. Same pain as when had heart attack. Now in NSR but pain, SOB, diaphoresis remains. No nitro given.

## 2015-11-25 NOTE — ED Notes (Signed)
Patient is less SOB and diaphoretic.Clothes were soaked through to mattress sheet

## 2015-11-25 NOTE — Progress Notes (Signed)
Attending responded to page. No verbal orders given.

## 2015-11-26 ENCOUNTER — Observation Stay (HOSPITAL_BASED_OUTPATIENT_CLINIC_OR_DEPARTMENT_OTHER): Payer: Medicare Other

## 2015-11-26 DIAGNOSIS — I129 Hypertensive chronic kidney disease with stage 1 through stage 4 chronic kidney disease, or unspecified chronic kidney disease: Secondary | ICD-10-CM | POA: Diagnosis not present

## 2015-11-26 DIAGNOSIS — I10 Essential (primary) hypertension: Secondary | ICD-10-CM | POA: Diagnosis present

## 2015-11-26 DIAGNOSIS — R778 Other specified abnormalities of plasma proteins: Secondary | ICD-10-CM

## 2015-11-26 DIAGNOSIS — F314 Bipolar disorder, current episode depressed, severe, without psychotic features: Secondary | ICD-10-CM | POA: Diagnosis not present

## 2015-11-26 DIAGNOSIS — I471 Supraventricular tachycardia: Secondary | ICD-10-CM

## 2015-11-26 DIAGNOSIS — R079 Chest pain, unspecified: Secondary | ICD-10-CM | POA: Diagnosis not present

## 2015-11-26 DIAGNOSIS — R002 Palpitations: Secondary | ICD-10-CM | POA: Diagnosis present

## 2015-11-26 DIAGNOSIS — Z5900 Homelessness unspecified: Secondary | ICD-10-CM

## 2015-11-26 DIAGNOSIS — B182 Chronic viral hepatitis C: Secondary | ICD-10-CM | POA: Diagnosis not present

## 2015-11-26 DIAGNOSIS — R7989 Other specified abnormal findings of blood chemistry: Secondary | ICD-10-CM

## 2015-11-26 DIAGNOSIS — F319 Bipolar disorder, unspecified: Secondary | ICD-10-CM | POA: Diagnosis not present

## 2015-11-26 LAB — COMPREHENSIVE METABOLIC PANEL
ALT: 71 U/L — ABNORMAL HIGH (ref 17–63)
AST: 43 U/L — ABNORMAL HIGH (ref 15–41)
Albumin: 3.5 g/dL (ref 3.5–5.0)
Alkaline Phosphatase: 49 U/L (ref 38–126)
Anion gap: 6 (ref 5–15)
BUN: 11 mg/dL (ref 6–20)
CO2: 25 mmol/L (ref 22–32)
Calcium: 8.9 mg/dL (ref 8.9–10.3)
Chloride: 108 mmol/L (ref 101–111)
Creatinine, Ser: 0.76 mg/dL (ref 0.61–1.24)
GFR calc Af Amer: >60 mL/min (ref >60)
GFR calc non Af Amer: >60 mL/min (ref >60)
Glucose, Bld: 108 mg/dL — ABNORMAL HIGH (ref 65–99)
Potassium: 4 mmol/L (ref 3.5–5.1)
Sodium: 139 mmol/L (ref 135–145)
Total Bilirubin: 0.3 mg/dL (ref 0.3–1.2)
Total Protein: 6.1 g/dL — ABNORMAL LOW (ref 6.5–8.1)

## 2015-11-26 LAB — LIPID PANEL
Cholesterol: 130 mg/dL (ref 0–200)
HDL: 33 mg/dL — ABNORMAL LOW (ref >40)
LDL Cholesterol: 80 mg/dL (ref 0–99)
Total CHOL/HDL Ratio: 3.9 RATIO
Triglycerides: 84 mg/dL (ref <150)
VLDL: 17 mg/dL (ref 0–40)

## 2015-11-26 LAB — ECHOCARDIOGRAM COMPLETE
Height: 62 in
Weight: 3770.75 oz

## 2015-11-26 LAB — MRSA PCR SCREENING: MRSA by PCR: NEGATIVE

## 2015-11-26 LAB — HEMOGLOBIN A1C
Hgb A1c MFr Bld: 5.8 % — ABNORMAL HIGH (ref 4.8–5.6)
Mean Plasma Glucose: 120 mg/dL

## 2015-11-26 MED ORDER — DILTIAZEM HCL ER COATED BEADS 120 MG PO CP24
120.0000 mg | ORAL_CAPSULE | Freq: Every day | ORAL | Status: DC
  Administered 2015-11-26: 120 mg via ORAL
  Filled 2015-11-26 (×2): qty 1

## 2015-11-26 NOTE — Progress Notes (Signed)
SUBJECTIVE: patient denies having palpitations or chest pain overnight. Denies any acute event. Denies nausea, vomiting, or shortness of breath. Patient complains of some abdominal pain and swelling in the epigastrium   BP (!) 125/93 (BP Location: Left Arm)   Pulse 95   Temp 97.6 F (36.4 C) (Oral)   Resp 20   Ht 5\' 2"  (1.575 m)   Wt 106.9 kg (235 lb 10.8 oz)   SpO2 98%   BMI 43.10 kg/m   Intake/Output Summary (Last 24 hours) at 11/26/15 1238 Last data filed at 11/26/15 1046  Gross per 24 hour  Intake              462 ml  Output                0 ml  Net              462 ml    PHYSICAL EXAM General appearance: alert, cooperative and no distress Lungs: clear to auscultation bilaterally and normal work of breathing Heart: regular rate and rhythm, S1, S2 normal, no murmur, click, rub or gallop Abdomen: soft, tenderness to palpation in epigastric region, normoactive bowel sounds Extremities: no edema, extremities warm and well perfused Neuro: patient is alert and oriented x3   LABS/IMAGING:  Troponin I     Status: None  Collection Time: 11/25/15  9:00 AM  Result Value Ref Range  Troponin I <0.03 <0.03 ng/mL   Collection Time: 11/25/15  3:28 PM  Result Value Ref Range  Troponin I 0.06 (HH) <0.03 ng/mL   Collection Time: 11/25/15  8:49 PM  Result Value Ref Range  Troponin I 0.04 (HH) <0.03 ng/mL   Comprehensive metabolic panel     Status: Abnormal  Collection Time: 11/26/15  2:20 AM  Result Value Ref Range  Sodium 139 135 - 145 mmol/L  Potassium 4.0 3.5 - 5.1 mmol/L  Chloride 108 101 - 111 mmol/L  CO2 25 22 - 32 mmol/L  Glucose, Bld 108 (H) 65 - 99 mg/dL  BUN 11 6 - 20 mg/dL  Creatinine, Ser 11/28/15 1.61 - 1.24 mg/dL  Calcium 8.9 8.9 - 0.96 mg/dL  Total Protein 6.1 (L) 6.5 - 8.1 g/dL  Albumin 3.5 3.5 - 5.0 g/dL  AST 43 (H) 15 - 41 U/L  ALT 71 (H) 17 - 63 U/L  Alkaline Phosphatase 49 38 - 126 U/L  Total Bilirubin 0.3 0.3 - 1.2 mg/dL  GFR calc non Af Amer 04.5 >60  mL/min  GFR calc Af Amer >60 >60 mL/min   Lipid panel     Status: Abnormal  Collection Time: 11/26/15  2:20 AM  Result Value Ref Range  Cholesterol 130 0 - 200 mg/dL  Triglycerides 84 11/28/15 mg/dL  HDL 33 (L) <981 mg/dL  Total CHOL/HDL Ratio 3.9 RATIO  VLDL 17 0 - 40 mg/dL  LDL Cholesterol 80 0 - 99 mg/dL     Current Meds: . amantadine  100 mg Oral Daily  . diclofenac  50 mg Oral TID  . divalproex  500 mg Oral Daily  . enoxaparin (LOVENOX) injection  40 mg Subcutaneous Q24H  . feeding supplement (ENSURE ENLIVE)  237 mL Oral BID BM  . gabapentin  300 mg Oral TID  . lamoTRIgine  25 mg Oral Daily  . lisinopril  20 mg Oral Daily  . loratadine  10 mg Oral Daily  . metoprolol tartrate  25 mg Oral BID  . pantoprazole  40 mg Oral Daily  . pneumococcal 23  valent vaccine  0.5 mL Intramuscular Tomorrow-1000  . polyethylene glycol  17 g Oral Daily  . QUEtiapine  100 mg Oral QHS  . QUEtiapine  25 mg Oral Daily  . senna-docusate  2 tablet Oral QHS  . sertraline  100 mg Oral Daily  . tamsulosin  0.4 mg Oral Daily     ASSESSMENT AND PLAN: This is a 55 yo man with history of tobacco, alcohol and cocaine abuse, Bipolar disorder, arthritis, CKD, GERD, hepatitis C, HTN, presenting with palpitations, chest pain, nausea, and vomiting. The patient found to have SVT by EMS arrival, which was resolved after adenosine cardioversion.   Principal Problem:   Palpitations Active Problems:   Polysubstance abuse   Hepatitis C   Bipolar 1 disorder (HCC)   Pain in the chest   Troponin level elevated   Homeless   Essential hypertension   Chest pain: The patient continues to be afebrile. Initial EKG by EMS with ST depressions which have resolved. No acute ischemic changes on EKG in ED. Troponin trended, with latest reading of 0.04, mild elevation could be demand ischemia or non-ACS stress induced. CXR with no acute cardiopulmonary process. UDS negative. CTA chest/abdomen/pelvis with negative  aneurysm/dissection and no PE. 2D echo showed normal systolic function, EF 55-60% and grade 1 diastolic dysfunction, with mild-moderate mitral valve regurgitation and L atrium mild dilation. - EKG this am - patient on telemetry. Patient to receive cardiac monitor per cardiology recs. - hemoglobin A1c 5.8, LDL 80  SVT: no events overnight per telemetry   HTN: Continue home lisinopril, metoprolol BID  Chronic hepatitis C infection: AST 43 and ALT 71. Bilirubin normal. History of chronic hepatitis C. Patient to follow up for treatment at [facility] as outpatient  Bipolar d/o: Continue home Depakote, Lamictal, Seroquel, Zoloft  Dispo: Admit patient to Observation with expected discharge tomorrow  Will [employer] Medical Student 8/18/201712:38 PM

## 2015-11-26 NOTE — Progress Notes (Signed)
Patient placed on contact precautions, previous history of MRSA; MRSA PCR screening completed per protocol.

## 2015-11-26 NOTE — Consult Note (Signed)
Admit date: 11/25/2015 Referring Physician  Dr. Heide Spark Primary Physician  [facility] Primary Cardiologist  Nond Reason for Consultation  Chest pain  HPI: This is a 55yo male with a history of polysubstance abuse (cocaine/ETOH/tob), GERD, bipolar affective disorder, Hep C, CKD who presented to the ER with complaints of palpitations and chest pain.  He had been an inpatien at the [facility] for depression after losing several close family members including his mom, fiance and a child dying of SIDS 2 years ago and has had a lot of problems with depression.  He noticed the day of admission that he was having some abdominal discomfort and his abdomen felt bloated.  He denies any fever, chills, diarrhea but this am got up and vomited x 1.  He says that he had some dark blood as well after having a BM.  He denies any BRBPR.  He was walking to [place] this am to get a medication for a UTI and suddenly started feeling his heart beating very hard and broke out in a diffuse sweat.  He got dizzy and felt like he might pass out and became SOB.  He also noticed chest discomfort that is hard for him to describe and said it was like his heart was pounding out of his chest.  This discomfort moved down into his stomach.    He apparently had a similar episode a few weeks ago with a workup at a hospital in [place] but this was in the setting of cocaine and heroin.    In ER he was noted to have a slightly elevated troponin of 0.06 otherwise labs were unremarkable except for an LDL of 80 and HbA1C of 5.8.  CT angio of the chest showed no dissection or pulmonary emboli but did show foci of coronary artery calcification and mild interstitial edema. Cardiology is now asked to consult for further evaluation of palpitations and chest pain.       PMH:   Past Medical History:  Diagnosis Date  . Alcohol abuse   . Anxiety   . Arthritis    "all over" (11/25/2015)  . Bipolar  1 disorder (HCC)   . Bipolar affective disorder (HCC)   . Chronic back pain   . Chronic kidney disease   . Cocaine abuse    smokes crack-cocaine  . Depression   . GERD (gastroesophageal reflux disease)   . Gout   . Headache    "q couple days; stress, tension, anger" (11/25/2015)  . Hemorrhoids   . Hepatitis C    "not yet treated" (11/25/2015)  . Hypertension   . Insomnia   . Myocardial infarction [facility]) 11/2015   "supposedly" (11/25/2015)  . OSA on CPAP    "had mask; it was stolen" (11/25/2015)  . PTSD (post-traumatic stress disorder)   . Tobacco abuse   . Vitamin D deficiency      PSH:   Past Surgical History:  Procedure Laterality Date  . FRACTURE SURGERY    . PATELLA FRACTURE SURGERY Left ~ 1995  . TONSILLECTOMY      Allergies:  Review of patient's allergies indicates no known allergies. Prior to Admit Meds:   Prescriptions Prior to Admission  Medication Sig Dispense Refill Last Dose  . amantadine (SYMMETREL) 100 MG capsule Take 100 mg by mouth daily.    Past Week at Unknown time  . cetirizine (ZYRTEC) 10 MG tablet Take 10 mg by mouth daily.   Past Week  at Unknown time  . diclofenac (VOLTAREN) 50 MG EC tablet Take 50 mg by mouth 3 (three) times daily.   Past Week at Unknown time  . divalproex (DEPAKOTE ER) 500 MG 24 hr tablet Take 500 mg by mouth daily.   Past Week at Unknown time  . gabapentin (NEURONTIN) 300 MG capsule Take 1 capsule (300 mg total) by mouth 3 (three) times daily. 90 capsule 0 Past Week at Unknown time  . hydrOXYzine (ATARAX/VISTARIL) 25 MG tablet Take 1 tablet (25 mg total) by mouth 3 (three) times daily as needed for anxiety. 30 tablet 0 Past Month at Unknown  . lamoTRIgine (LAMICTAL) 25 MG tablet Take 25 mg by mouth daily.   Past Week at Unknown time  . lisinopril (PRINIVIL,ZESTRIL) 20 MG tablet Take 20 mg by mouth daily.   Past Week at Unknown time  . metoprolol tartrate (LOPRESSOR) 25 MG tablet Take 25 mg by mouth 2 (two) times daily.   Past Week at  Unknown time  . omeprazole (PRILOSEC) 20 MG capsule Take 20 mg by mouth daily.   Past Week at Unknown time  . pantoprazole (PROTONIX) 40 MG tablet Take 1 tablet (40 mg total) by mouth daily.   Past Week at Unknown time  . QUEtiapine (SEROQUEL) 100 MG tablet Take 100 mg by mouth at bedtime.   Past Week at Unknown time  . QUEtiapine (SEROQUEL) 25 MG tablet Take 25 mg by mouth daily.   Past Week at Unknown time  . sertraline (ZOLOFT) 100 MG tablet Take 100 mg by mouth daily.   Past Week at Unknown time  . tamsulosin (FLOMAX) 0.4 MG CAPS capsule Take 1 capsule (0.4 mg total) by mouth daily. 30 capsule 0 Past Week at Unknown time  . traZODone (DESYREL) 100 MG tablet Take 1 tablet (100 mg total) by mouth at bedtime as needed for sleep. (Patient taking differently: Take 150 mg by mouth at bedtime. ) 30 tablet 0 Past Week at Unknown time  . zolpidem (AMBIEN) 10 MG tablet Take 10 mg by mouth at bedtime.   Past Week at Unknown time   Fam HX:    Family History  Problem Relation Age of Onset  . Hypertension Brother   . Diabetes Brother   . Hypertension Mother   . Diabetes Mother   . Arthritis Mother   . Other Sister     bone disease   Social HX:    Social History   Social History  . Marital status: Single    Spouse name: N/A  . Number of children: N/A  . Years of education: N/A   Occupational History  . Not on file.   Social History Main Topics  . Smoking status: Current Every Day Smoker    Packs/day: 0.12    Years: 46.00  . Smokeless tobacco: Never Used  . Alcohol use Yes     Comment: 11/25/2015 "quit alcohol 6 months ago; used to drink from morning til night"  . Drug use:     Types: Marijuana, "Crack" cocaine     Comment: 11/25/2015 "no marijuana in the last 6 months; 3 wks clean from crack cocaine"  . Sexual activity: Not Currently   Other Topics Concern  . Not on file   Social History Narrative   ** Merged History Encounter **       Lives alone in [place], [place]     ROS:   All 11 ROS were addressed and are negative except what is stated in  the HPI  Physical Exam: Blood pressure 123/76, pulse 73, temperature 98 F (36.7 C), temperature source Oral, resp. rate 20, height 5\' 2"  (1.575 m), weight 235 lb 10.8 oz (106.9 kg), SpO2 100 %.    General: Well developed, well nourished, in no acute distress Head: Eyes PERRLA, No xanthomas.   Normal cephalic and atramatic  Lungs:   Clear bilaterally to auscultation and percussion. Heart:   HRRR S1 S2 Pulses are 2+ & equal.            No carotid bruit. No JVD.  No abdominal bruits. No femoral bruits. Abdomen: Bowel sounds are positive, abdomen soft and non-tender without masses Msk:  Back normal, normal gait. Normal strength and tone for age. Extremities:   No clubbing, cyanosis or edema.  DP +1 Neuro: Alert and oriented X 3. Psych:  Good affect, responds appropriately    Labs:   Lab Results  Component Value Date   WBC 7.3 11/25/2015   HGB 14.3 11/25/2015   HCT 42.0 11/25/2015   MCV 87.2 11/25/2015   PLT 275 11/25/2015    Recent Labs Lab 11/26/15 0220  NA 139  K 4.0  CL 108  CO2 25  BUN 11  CREATININE 0.76  CALCIUM 8.9  PROT 6.1*  BILITOT 0.3  ALKPHOS 49  ALT 71*  AST 43*  GLUCOSE 108*   No results found for: PTT No results found for: INR, PROTIME Lab Results  Component Value Date   CKTOTAL 872 (H) 07/13/2012   CKMB 8.9 (HH) 07/13/2012   TROPONINI 0.04 (HH) 11/25/2015     Lab Results  Component Value Date   CHOL 130 11/26/2015   CHOL 154 03/23/2014   CHOL 171 02/23/2012   Lab Results  Component Value Date   HDL 33 (L) 11/26/2015   HDL 32 (L) 03/23/2014   HDL 47 02/23/2012   Lab Results  Component Value Date   LDLCALC 80 11/26/2015   LDLCALC 97 03/23/2014   LDLCALC 106 (H) 02/23/2012   Lab Results  Component Value Date   TRIG 84 11/26/2015   TRIG 126 03/23/2014   TRIG 91 02/23/2012   Lab Results  Component Value Date   CHOLHDL 3.9 11/26/2015   CHOLHDL 4.8 03/23/2014    CHOLHDL 3.6 02/23/2012   No results found for: LDLDIRECT    Radiology:  Dg Chest Portable 1 View  Result Date: 11/25/2015 CLINICAL DATA:  55 year old male with mid lower chest pain and shortness of breath this morning. Dizziness and diaphoresis. Initial encounter. Smoker. EXAM: PORTABLE CHEST 1 VIEW COMPARISON:  09/25/2015 and earlier. FINDINGS: Portable AP supine view at 0856 hours. Normal cardiac size and mediastinal contours. Stable to mildly improved lung volumes. Allowing for portable technique the lungs are clear. No pneumothorax. No pleural effusion. IMPRESSION: No acute cardiopulmonary abnormality. Electronically Signed   By: Odessa FlemingH  Hall M.D.   On: 11/25/2015 09:06   Ct Angio Chest/abd/pel For Dissection W And/or Wo Contrast  Result Date: 11/25/2015 CLINICAL DATA:  Chest and abdominal pain radiating toward back EXAM: CT ANGIOGRAPHY CHEST, ABDOMEN AND PELVIS TECHNIQUE: Initially, axial CT images were obtained through the chest without intravenous contrast material administration. Multidetector CT imaging through the chest, abdomen and pelvis was performed using the standard protocol during bolus administration of intravenous contrast. Multiplanar reconstructed images and MIPs were obtained and reviewed to evaluate the vascular anatomy. CONTRAST:  100 mL Isovue 370 nonionic COMPARISON:  Chest CT December 16, 2014; chest radiograph November 25, 2015 ; CT abdomen  and pelvis Sep 06, 2012 FINDINGS: CTA CHEST FINDINGS There is no demonstrable thoracic aortic aneurysm or dissection. The visualized great vessels appear unremarkable. The left and right common carotid arteries arise as a common trunk, an anatomic variant. There are multiple foci of coronary artery calcification evident. Pericardium is not appreciably thickened. There is no demonstrable pulmonary embolus. Visualized thyroid appears within normal limits. There is no apparent adenopathy. There is patchy atelectasis throughout the lungs bilaterally.  There may be a slight degree of early interstitial edema in the lower lung zone regions. There is a bulla in the right base region. There are no blastic or lytic bone lesions. Review of the MIP images confirms the above findings. CTA ABDOMEN AND PELVIS FINDINGS There is no demonstrable abdominal aortic aneurysm or dissection. There is atherosclerotic calcification and mild plaque in the distal abdominal aorta without evidence approaching hemodynamically significant obstruction. There is a slight degree of calcification in the proximal right common iliac artery. There is modest calcification in the right hypogastric artery. There is no appreciable narrowing of the major pelvic mesenteric vessels. There is no aneurysm or dissection involving the major pelvic arterial vessels. The celiac and superior mesenteric arteries are widely patent. The inferior mesenteric artery is patent as well. Renal arteries bilaterally are patent. Note that there is a small accessory renal artery on the left which is widely patent. A single renal artery is noted on the right. No liver lesions are appreciable. Gallbladder wall is not appreciably thickened. There is no biliary duct dilatation. Spleen, pancreas, and adrenals appear normal. Kidneys bilaterally show no evidence of mass or hydronephrosis on either side. There is no demonstrable renal or ureteral calculus on either side. The urinary bladder is midline with wall thickness within normal limits. No pelvic mass or pelvic fluid collection is seen. There are a few prostatic calculi. Prostate and seminal vesicles appear normal. Appendix appears normal. There is no ascites, adenopathy, or abscess in the abdomen or pelvis. No bowel obstruction or bowel wall thickening. No free air or portal venous air. There is degenerative change in the lumbar spine with vacuum phenomenon at L4-5. There are no blastic or lytic bone lesions. Review of the MIP images confirms the above findings. IMPRESSION:  CT angiogram chest: . There is no demonstrable thoracic aortic aneurysm or dissection. No pulmonary embolus. There are foci of coronary artery calcification. There are areas of patchy atelectasis. There may be a slight degree of interstitial edema in the bases. There is no airspace consolidation. No adenopathy. CT angiogram abdomen and pelvis: No abdominal aortic or major arterial vessel aneurysm or dissection in the abdomen or pelvis. Mild atherosclerotic calcification and plaque in the distal aorta without hemodynamically significant obstruction. A few scattered foci of calcification in pelvic arterial vessels without appreciable vessel narrowing. Major mesenteric vessels appear widely patent. Study otherwise essentially unremarkable. No inflammatory focus or abnormal fluid collection. Appendix appears normal. No bowel obstruction or abscess. Electronically Signed   By: Bretta Bang III M.D.   On: 11/25/2015 10:25    EKG:  NSR with no ST changes  ASSESSMENT/PLAN:   1.  Palpitations - SVT reported in H&P note although I cannot find any strips to document this.  Recommend continued tele monitoring.  He will need an event monitor at discharge to assess for arrhythmias.  2.  Chest pain that sounds atypical for coronary ischemia and sound more related to his palpitations.  He did get diaphoretic but he was out in the heat  and that could have been related to an underlying arrhythmia.  Trop is minimally elevated, and again, could be related demand ischemia in setting of a tachyarrhythmia.  Apparently initial EKG by EMS showed ST depression but that EKG is unavailable at this time.  That is resolved by EKG in ER.  UDS is negative and no PE or aortic dissection on Chest CT.  He does have evidence of foci of coronary artery calcification on chest CT.  There is no family history of CAD but he is a smoker and therefore at risk for CAD.  2D echo is pending.  If this is normal then will get a nuclear stress test to  rule out ischemia.    3.  HTN - BP controlled.  Continue BB and ACE I.   Armanda Magic, MD  11/26/2015  8:03 AM

## 2015-11-26 NOTE — Progress Notes (Signed)
  Echocardiogram 2D Echocardiogram has been performed.  Arvil Chaco 11/26/2015, 10:09 AM

## 2015-11-26 NOTE — Progress Notes (Signed)
Nutrition Brief Note  Patient identified on the Malnutrition Screening Tool (MST) Report  Wt Readings from Last 15 Encounters:  11/26/15 235 lb 10.8 oz (106.9 kg)  01/27/15 261 lb (118.4 kg)  03/26/14 259 lb 0.7 oz (117.5 kg)  03/23/13 270 lb (122.5 kg)  07/15/12 256 lb (116.1 kg)  02/25/12 255 lb 1.2 oz (115.7 kg)    Body mass index is 43.1 kg/m. Patient meets criteria for Morbid Obesity based on current BMI. Pt reports losing from 265 lbs to 235 lbs in the past 3 months-11% weight loss. No weight history on file to confirm. Pt reports losing weight due to stress, traveling, and eating less. He is unable to provide any detail as to how much less he is eating. He reports eating 2 meals daily and snacking a lot every night PTA. Pt has no signs of muscle or fat wasting per nutrition-focused physical exam. He is eating well and reports having a good appetite; asking for more food at meals. RD reviewed snacks options from [facility] and discussed heart healthy foods that patient can add to meal orders to help fill him up without adding more salt or fat. Pt appreciative of RD visit, but denies any nutritional needs at this time. RD encouraged healthy intake.   Current diet order is Heart Healthy, patient is consuming approximately 100% of meals at this time. Labs and medications reviewed.   No nutrition interventions warranted at this time. If nutrition issues arise, please consult RD.   Dorothea Ogle RD, LDN Inpatient Clinical Dietitian Pager: [phone] After Hours Pager: [phone]

## 2015-11-26 NOTE — Progress Notes (Signed)
   Subjective: No acute events overnight. Patient continues to feel well. He denies shortness of breath, chest pain or palpitations. He does endorse mild abdominal pain. He has no additional acute complaints or concerns this morning.  Objective:  Vital signs in last 24 hours: Vitals:   11/25/15 1445 11/25/15 1508 11/25/15 2040 11/26/15 0600  BP: 115/77 136/77 131/82 123/76  Pulse: 87 90 85 73  Resp:  (!) 21 20 20   Temp:   98.1 F (36.7 C) 98 F (36.7 C)  TempSrc:   Oral Oral  SpO2: 98% 100% 100% 100%  Weight:  233 lb 8 oz (105.9 kg)  235 lb 10.8 oz (106.9 kg)  Height:       Physical Exam  Constitutional: He is oriented to person, place, and time. He appears well-developed and well-nourished.  HENT:  Head: Normocephalic and atraumatic.  Cardiovascular: Normal rate and regular rhythm.  Exam reveals no gallop and no friction rub.   No murmur heard. Respiratory: Effort normal and breath sounds normal. No respiratory distress.  GI: Soft. Bowel sounds are normal. He exhibits no distension. There is tenderness.  Extremely mild tenderness to palpation in the midepigastric region  Musculoskeletal: He exhibits no edema.  Neurological: He is alert and oriented to person, place, and time.     Assessment/Plan: [name] is a 55 year old male with history of bipolar, depression, chronic kidney disease, hepatitis C infection who presented with chest pain, palpitations nausea and vomiting found to have supraventricular tachycardia currently status post adenosine cardioversion.  1. Chest Pain and Palpitations -- Telemetry -- We will need an event monitor arranged by cardiology -- Echocardiogram -- If echocardiogram is normal patient will need a nuclear stress test to rule out ischemia  2. Hypertension Patient currently normotensive with the most recent blood pressure of 123/76. -- Continue metoprolol 25 mg twice a day -- Continue lisinopril 20 mg once daily  3. Bipolar, depression and  other psychiatric illness -- Continue home psychiatric medications -- Seroquel 100 mg daily, divalproex 500 mg 24-hour tablet, sertraline 100 mg once daily, trazodone 150 mg at bedtime when necessary for sleep  4.DVT/PE prophylaxis -- Anoxia parent 40 mg subcutaneous injection once daily   Dispo: Anticipated discharge in approximately 1 day(s).   53, MD 11/26/2015, 11:45 AM Pager: [phone]

## 2015-11-26 NOTE — Progress Notes (Signed)
MRSA PCR negative; contact precautions removed.

## 2015-11-26 NOTE — Progress Notes (Signed)
SUBJECTIVE:  No complaints  OBJECTIVE:   Vitals:   Vitals:   11/25/15 1508 11/25/15 2040 11/26/15 0600 11/26/15 1159  BP: 136/77 131/82 123/76 (!) 125/93  Pulse: 90 85 73 95  Resp: (!) 21 20 20 20   Temp:  98.1 F (36.7 C) 98 F (36.7 C) 97.6 F (36.4 C)  TempSrc:  Oral Oral Oral  SpO2: 100% 100% 100% 98%  Weight: 233 lb 8 oz (105.9 kg)  235 lb 10.8 oz (106.9 kg)   Height:       I&O's:   Intake/Output Summary (Last 24 hours) at 11/26/15 1350 Last data filed at 11/26/15 1046  Gross per 24 hour  Intake              462 ml  Output                0 ml  Net              462 ml   TELEMETRY: Reviewed telemetry pt in NSR:     PHYSICAL EXAM General: Well developed, well nourished, in no acute distress Head: Eyes PERRLA, No xanthomas.   Normal cephalic and atramatic  Lungs:   Clear bilaterally to auscultation and percussion. Heart:   HRRR S1 S2 Pulses are 2+ & equal.  Abdomen: Bowel sounds are positive, abdomen soft and non-tender without masses Msk:  Back normal, normal gait. Normal strength and tone for age. Extremities:   No clubbing, cyanosis or edema.  DP +1 Neuro: Alert and oriented X 3. Psych:  Good affect, responds appropriately   LABS: Basic Metabolic Panel:  Recent Labs  11/28/15 0900 11/25/15 0904 11/26/15 0220  NA 139 144 139  K 4.0 4.1 4.0  CL 112* 107 108  CO2 19*  --  25  GLUCOSE 130* 130* 108*  BUN 11 13 11   CREATININE 1.21 1.10 0.76  CALCIUM 9.0  --  8.9   Liver Function Tests:  Recent Labs  11/25/15 0900 11/26/15 0220  AST 52* 43*  ALT 79* 71*  ALKPHOS 48 49  BILITOT 0.6 0.3  PROT 6.2* 6.1*  ALBUMIN 3.7 3.5   No results for input(s): LIPASE, AMYLASE in the last 72 hours. CBC:  Recent Labs  11/25/15 0900 11/25/15 0904  WBC 7.3  --   NEUTROABS 5.2  --   HGB 13.8 14.3  HCT 42.4 42.0  MCV 87.2  --   PLT 275  --    Cardiac Enzymes:  Recent Labs  11/25/15 0900 11/25/15 1528 11/25/15 2049  TROPONINI <0.03 0.06* 0.04*     BNP: Invalid input(s): POCBNP D-Dimer: No results for input(s): DDIMER in the last 72 hours. Hemoglobin A1C:  Recent Labs  11/25/15 1528  HGBA1C 5.8*   Fasting Lipid Panel:  Recent Labs  11/26/15 0220  CHOL 130  HDL 33*  LDLCALC 80  TRIG 84  CHOLHDL 3.9   Thyroid Function Tests: No results for input(s): TSH, T4TOTAL, T3FREE, THYROIDAB in the last 72 hours.  Invalid input(s): FREET3 Anemia Panel: No results for input(s): VITAMINB12, FOLATE, FERRITIN, TIBC, IRON, RETICCTPCT in the last 72 hours. Coag Panel:   No results found for: INR, PROTIME  RADIOLOGY: Dg Chest Portable 1 View  Result Date: 11/25/2015 CLINICAL DATA:  55 year old male with mid lower chest pain and shortness of breath this morning. Dizziness and diaphoresis. Initial encounter. Smoker. EXAM: PORTABLE CHEST 1 VIEW COMPARISON:  09/25/2015 and earlier. FINDINGS: Portable AP supine view at 0856 hours. Normal cardiac size  and mediastinal contours. Stable to mildly improved lung volumes. Allowing for portable technique the lungs are clear. No pneumothorax. No pleural effusion. IMPRESSION: No acute cardiopulmonary abnormality. Electronically Signed   By: Odessa Fleming M.D.   On: 11/25/2015 09:06   Ct Angio Chest/abd/pel For Dissection W And/or Wo Contrast  Result Date: 11/25/2015 CLINICAL DATA:  Chest and abdominal pain radiating toward back EXAM: CT ANGIOGRAPHY CHEST, ABDOMEN AND PELVIS TECHNIQUE: Initially, axial CT images were obtained through the chest without intravenous contrast material administration. Multidetector CT imaging through the chest, abdomen and pelvis was performed using the standard protocol during bolus administration of intravenous contrast. Multiplanar reconstructed images and MIPs were obtained and reviewed to evaluate the vascular anatomy. CONTRAST:  100 mL Isovue 370 nonionic COMPARISON:  Chest CT December 16, 2014; chest radiograph November 25, 2015 ; CT abdomen and pelvis Sep 06, 2012 FINDINGS:  CTA CHEST FINDINGS There is no demonstrable thoracic aortic aneurysm or dissection. The visualized great vessels appear unremarkable. The left and right common carotid arteries arise as a common trunk, an anatomic variant. There are multiple foci of coronary artery calcification evident. Pericardium is not appreciably thickened. There is no demonstrable pulmonary embolus. Visualized thyroid appears within normal limits. There is no apparent adenopathy. There is patchy atelectasis throughout the lungs bilaterally. There may be a slight degree of early interstitial edema in the lower lung zone regions. There is a bulla in the right base region. There are no blastic or lytic bone lesions. Review of the MIP images confirms the above findings. CTA ABDOMEN AND PELVIS FINDINGS There is no demonstrable abdominal aortic aneurysm or dissection. There is atherosclerotic calcification and mild plaque in the distal abdominal aorta without evidence approaching hemodynamically significant obstruction. There is a slight degree of calcification in the proximal right common iliac artery. There is modest calcification in the right hypogastric artery. There is no appreciable narrowing of the major pelvic mesenteric vessels. There is no aneurysm or dissection involving the major pelvic arterial vessels. The celiac and superior mesenteric arteries are widely patent. The inferior mesenteric artery is patent as well. Renal arteries bilaterally are patent. Note that there is a small accessory renal artery on the left which is widely patent. A single renal artery is noted on the right. No liver lesions are appreciable. Gallbladder wall is not appreciably thickened. There is no biliary duct dilatation. Spleen, pancreas, and adrenals appear normal. Kidneys bilaterally show no evidence of mass or hydronephrosis on either side. There is no demonstrable renal or ureteral calculus on either side. The urinary bladder is midline with wall thickness  within normal limits. No pelvic mass or pelvic fluid collection is seen. There are a few prostatic calculi. Prostate and seminal vesicles appear normal. Appendix appears normal. There is no ascites, adenopathy, or abscess in the abdomen or pelvis. No bowel obstruction or bowel wall thickening. No free air or portal venous air. There is degenerative change in the lumbar spine with vacuum phenomenon at L4-5. There are no blastic or lytic bone lesions. Review of the MIP images confirms the above findings. IMPRESSION: CT angiogram chest: . There is no demonstrable thoracic aortic aneurysm or dissection. No pulmonary embolus. There are foci of coronary artery calcification. There are areas of patchy atelectasis. There may be a slight degree of interstitial edema in the bases. There is no airspace consolidation. No adenopathy. CT angiogram abdomen and pelvis: No abdominal aortic or major arterial vessel aneurysm or dissection in the abdomen or pelvis. Mild atherosclerotic  calcification and plaque in the distal aorta without hemodynamically significant obstruction. A few scattered foci of calcification in pelvic arterial vessels without appreciable vessel narrowing. Major mesenteric vessels appear widely patent. Study otherwise essentially unremarkable. No inflammatory focus or abnormal fluid collection. Appendix appears normal. No bowel obstruction or abscess. Electronically Signed   By: Bretta Bang III M.D.   On: 11/25/2015 10:25   ASSESSMENT/PLAN:   1.  Palpitations - Apparently had SVT reported by EMS although I cannot find any strips to document this. This terminated after Adenosine 6mg  IV and he has not had any further arrhythmias on tele. He will need an event monitor at discharge to assess for suppression of SVT and evaluate for any other arrhythmias.  We will arrange this.  Would avoid BB use given his history of cocaine.  Will start cardizem CD 180mg  daily for suppression of SVT and stop  lopressor.  2.  Chest pain that sounds atypical for coronary ischemia and sound more related to his palpitations.  He did get diaphoretic but he was out in the heat and that could have been related to SVT.  Trop is minimally elevated, and again, could be related demand ischemia in setting of a tachyarrhythmia.  Apparently initial EKG by EMS showed ST depression but that EKG is unavailable at this time.  That is resolved by EKG in ER.  UDS is negative and no PE or aortic dissection on Chest CT.  He does have evidence of foci of coronary artery calcification on chest CT.  There is no family history of CAD but he is a smoker and therefore at risk for CAD.  2D echo showed normal LVF with EF 55-60% and G1DD and mild to moderate MR.  Will make NPO after MN for nuclear stress test in am.  If no ischemia then ok for discharge tomorrow.  3.  HTN - BP controlled.  Continue  ACE I. Would not use BB therapy given his history of cocaine use.  Will start cardizem CD 180mg  daily.     Armanda Magic, MD  11/26/2015  1:50 PM

## 2015-11-27 ENCOUNTER — Observation Stay (HOSPITAL_BASED_OUTPATIENT_CLINIC_OR_DEPARTMENT_OTHER): Payer: Medicare Other

## 2015-11-27 ENCOUNTER — Observation Stay (HOSPITAL_COMMUNITY): Payer: Medicare Other

## 2015-11-27 DIAGNOSIS — I10 Essential (primary) hypertension: Secondary | ICD-10-CM | POA: Diagnosis not present

## 2015-11-27 DIAGNOSIS — F319 Bipolar disorder, unspecified: Secondary | ICD-10-CM | POA: Diagnosis not present

## 2015-11-27 DIAGNOSIS — R079 Chest pain, unspecified: Secondary | ICD-10-CM

## 2015-11-27 DIAGNOSIS — I471 Supraventricular tachycardia: Secondary | ICD-10-CM | POA: Diagnosis not present

## 2015-11-27 DIAGNOSIS — R002 Palpitations: Secondary | ICD-10-CM | POA: Diagnosis not present

## 2015-11-27 LAB — NM MYOCAR MULTI W/SPECT W/WALL MOTION / EF
Estimated workload: 1 METS
MPHR: 165 {beats}/min
Peak HR: 113 {beats}/min
Percent HR: 68 %
Rest HR: 76 {beats}/min

## 2015-11-27 MED ORDER — NITROGLYCERIN 0.4 MG SL SUBL
0.4000 mg | SUBLINGUAL_TABLET | SUBLINGUAL | Status: DC | PRN
  Administered 2015-11-27: 0.4 mg via SUBLINGUAL

## 2015-11-27 MED ORDER — TECHNETIUM TC 99M TETROFOSMIN IV KIT
10.0000 | PACK | Freq: Once | INTRAVENOUS | Status: AC | PRN
  Administered 2015-11-27: 10 via INTRAVENOUS

## 2015-11-27 MED ORDER — DILTIAZEM HCL 30 MG PO TABS: 30.0000 mg | ORAL_TABLET | Freq: Once | ORAL | Status: DC

## 2015-11-27 MED ORDER — REGADENOSON 0.4 MG/5ML IV SOLN
INTRAVENOUS | Status: AC
  Filled 2015-11-27: qty 5

## 2015-11-27 MED ORDER — TECHNETIUM TC 99M TETROFOSMIN IV KIT
30.0000 | PACK | Freq: Once | INTRAVENOUS | Status: AC | PRN
  Administered 2015-11-27: 30 via INTRAVENOUS

## 2015-11-27 MED ORDER — NITROGLYCERIN 0.4 MG SL SUBL
SUBLINGUAL_TABLET | SUBLINGUAL | Status: AC
  Filled 2015-11-27: qty 1

## 2015-11-27 MED ORDER — DILTIAZEM HCL ER COATED BEADS 180 MG PO CP24
180.0000 mg | ORAL_CAPSULE | Freq: Every day | ORAL | Status: DC
  Administered 2015-11-27 – 2015-11-28 (×2): 180 mg via ORAL
  Filled 2015-11-27 (×2): qty 1

## 2015-11-27 MED ORDER — DILTIAZEM HCL ER COATED BEADS 180 MG PO CP24: 180.0000 mg | ORAL_CAPSULE | Freq: Every day | ORAL | Status: DC

## 2015-11-27 MED ORDER — REGADENOSON 0.4 MG/5ML IV SOLN
0.4000 mg | Freq: Once | INTRAVENOUS | Status: AC
  Administered 2015-11-27: 0.4 mg via INTRAVENOUS
  Filled 2015-11-27: qty 5

## 2015-11-27 NOTE — Discharge Summary (Signed)
Name: [name] MRN: [id number] DOB: [birth year] 55 y.o. PCP: [facility]  Date of Admission: 11/25/2015  8:30 AM Date of Discharge: 11/27/2015 Attending Physician: Earl Lagos, MD  Discharge Diagnosis: 1. Supraventricular tachycardia Principal Problem:   Palpitations Active Problems:   Polysubstance abuse   Hepatitis C   Bipolar 1 disorder (HCC)   Pain in the chest   Troponin level elevated   Homeless   Essential hypertension   SVT (supraventricular tachycardia) (HCC)   Discharge Medications:   Medication List    STOP taking these medications   metoprolol tartrate 25 MG tablet Commonly known as:  LOPRESSOR   omeprazole 20 MG capsule Commonly known as:  PRILOSEC     TAKE these medications   amantadine 100 MG capsule Commonly known as:  SYMMETREL Take 100 mg by mouth daily.   cetirizine 10 MG tablet Commonly known as:  ZYRTEC Take 10 mg by mouth daily.   diclofenac 50 MG EC tablet Commonly known as:  VOLTAREN Take 50 mg by mouth 3 (three) times daily.   diltiazem 240 MG 24 hr capsule Commonly known as:  CARDIZEM CD Take 1 capsule (240 mg total) by mouth daily.   divalproex 500 MG 24 hr tablet Commonly known as:  DEPAKOTE ER Take 500 mg by mouth daily.   gabapentin 300 MG capsule Commonly known as:  NEURONTIN Take 1 capsule (300 mg total) by mouth 3 (three) times daily.   hydrOXYzine 25 MG tablet Commonly known as:  ATARAX/VISTARIL Take 1 tablet (25 mg total) by mouth 3 (three) times daily as needed for anxiety.   lamoTRIgine 25 MG tablet Commonly known as:  LAMICTAL Take 25 mg by mouth daily.   lisinopril 20 MG tablet Commonly known as:  PRINIVIL,ZESTRIL Take 20 mg by mouth daily.   pantoprazole 40 MG tablet Commonly known as:  PROTONIX Take 1 tablet (40 mg total) by mouth daily.   QUEtiapine 100 MG tablet Commonly known as:  SEROQUEL Take 100 mg by mouth at bedtime. What changed:  Another medication with the  same name was removed. Continue taking this medication, and follow the directions you see here.   sertraline 100 MG tablet Commonly known as:  ZOLOFT Take 100 mg by mouth daily.   tamsulosin 0.4 MG Caps capsule Commonly known as:  FLOMAX Take 1 capsule (0.4 mg total) by mouth daily.   traZODone 100 MG tablet Commonly known as:  DESYREL Take 1 tablet (100 mg total) by mouth at bedtime as needed for sleep. What changed:  how much to take  when to take this   zolpidem 10 MG tablet Commonly known as:  AMBIEN Take 10 mg by mouth at bedtime.       Disposition and follow-up:   Mr.Rodert A Brummitt was discharged from [facility] in Good condition.  At the hospital follow up visit please address:  1.  Cardiology follow up with Dr. Mayford Knife  2.  Labs / imaging needed at time of follow-up: none  3.  Pending labs/ test needing follow-up: none  Follow-up Appointments:    Hospital Course by problem list: Principal Problem:   Palpitations Active Problems:   Polysubstance abuse   Hepatitis C   Bipolar 1 disorder (HCC)   Pain in the chest   Troponin level elevated   Homeless   Essential hypertension   SVT (supraventricular tachycardia) (HCC)   1. Chest Pain and Palpitations: patient presented with chest pain, palpitations, nausea, and  vomiting. Found with supraventricular tachycardia on EMS arrival with pulse of 111, and was resolved status post 6 mg adenosine administration. EKG after resolution showed normal sinus rhythm. Patient was monitored on telemetry during admission. UDS was negative and no PE or aortic dissection on Chest CT. Chest pain and discomfort likely 2/2 to supraventricular tachycardia. 2D echo showed normal LVF with EF 55-60% and grade 1 diastolic dysfunction and mild to moderate mitral stenosis. Nuclear stress test showed mildly reversible inferoapical ischemia. Cardiac catheterization and angiography showed diffuse coronary disease, but there did not  appear to be any hemodynamically significant blockages.   2. Hypertension: patient was continued on home lisinopril. Patient's home metoprolol was discontinued and he was started on cardizem 180 mg daily per cardiology recommendation.  3. Bipolar, depression and other psychiatric illness: patient was continued on home Depakote, Lamictal, Seroquel, and Zoloft.   Discharge Vitals:   BP 133/90   Pulse 90   Temp 98 F (36.7 C) (Oral)   Resp 18   Ht 5\' 2"  (1.575 m)   Wt 106.2 kg (234 lb 1.6 oz) Comment: b scale  SpO2 99%   BMI 42.82 kg/m   Pertinent Labs, Studies, and Procedures:  EKG: Normal sinus rhythm. bnormal R-wave progression, early transition.        Troponin I     Status: None  Collection Time: 11/25/15  9:00 AM   Result Value Ref Range  Troponin I <0.03 <0.03 ng/mL           Collection Time: 11/25/15  3:28 PM   Result Value Ref Range  Troponin I 0.06 (HH) <0.03 ng/mL           Collection Time: 11/25/15  8:49 PM   Result Value Ref Range  Troponin I 0.04 (HH) <0.03 ng/mL    Basic metabolic panel     Status: None   Collection Time: 11/29/15  5:55 AM  Result Value Ref Range   Sodium 137 135 - 145 mmol/L   Potassium 4.1 3.5 - 5.1 mmol/L   Chloride 104 101 - 111 mmol/L   CO2 24 22 - 32 mmol/L   Glucose, Bld 94 65 - 99 mg/dL   BUN 12 6 - 20 mg/dL   Creatinine, Ser 12/01/15 0.61 - 1.24 mg/dL   Calcium 9.2 8.9 - 2.99 mg/dL   GFR calc non Af Amer >60 >60 mL/min   GFR calc Af Amer >60 >60 mL/min           Lipid panel     Status: Abnormal  Collection Time: 11/26/15  2:20 AM   Result Value Ref Range  Cholesterol 130 0 - 200 mg/dL   Triglycerides 84 11/28/15 mg/dL   HDL 33 (L) <696 mg/dL   Total CHOL/HDL Ratio 3.9 RATIO   VLDL 17 0 - 40 mg/dL   LDL Cholesterol 80 0 - 99 mg/dL    NM Myocar Multi W/Spect W/Wall Motion / EF 11/26/15 Inferior apical attenuation artifact versus mild inducible ischemia with pharmacologic stress. Normal left ventricular wall  motion. Left ventricular ejection fraction 61%. Non invasive risk stratification: Low to intermediate  Left Heart Cath and Coronary Angiography 11/29/15 Diffuse coronary disease but there do not appear to be any hemodynamically significant blockages. Medical management  Discharge Instructions: Patient to coordinate with cardiology for 30 day event monitor and follow up. Patient was advised to eat a heart-healthy diet and partake in regular exercise. Patient instructed to discontinue home metoprolol, and to instead take cardizem 180 mg  daily. Patient will also be started on a stain and baby aspirin for medical management of coronary artery disease. Patient advised to stay out of the heat. Patient to be provided documentation for this hospital admission.    Signed: Philomena Doheny, Medical Student 11/27/2015, 2:51 PM

## 2015-11-27 NOTE — Progress Notes (Signed)
SUBJECTIVE: patient reports no acute events or complaints overnight. Patient denies nausea, vomiting, chest pain, palpitations, and dyspnea. Patient continues to complain of abdominal pain in RLQ and LLQ. Endorses malodorous urine, denies having dysuria or hematuria. Patient denies constipation, diarrhea, melena, and hematochezia.  BP 117/73 (BP Location: Left Arm)   Pulse 90   Temp 98 F (36.7 C) (Oral)   Resp 18   Ht 5\' 2"  (1.575 m)   Wt 106.2 kg (234 lb 1.6 oz) Comment: b scale  SpO2 99%   BMI 42.82 kg/m   Intake/Output Summary (Last 24 hours) at 11/27/15 0726 Last data filed at 11/27/15 0615  Gross per 24 hour  Intake              240 ml  Output             1525 ml  Net            -1285 ml    PHYSICAL EXAM General appearance: alert, cooperative and no distress Lungs: clear to auscultation bilaterally and normal work of breathing Heart: regular rate and rhythm, S1, S2 normal, no murmur, click, rub or gallop Abdomen: soft, mild tendernes to palpation in LLQ and RLQ, no rebound or guarding, normoactive bowel sounds Extremities: no edema Neuro: alert and oriented x3   LABS/IMAGING: No recent labs    Current Meds: . amantadine  100 mg Oral Daily  . diclofenac  50 mg Oral TID  . diltiazem  120 mg Oral Daily  . divalproex  500 mg Oral Daily  . enoxaparin (LOVENOX) injection  40 mg Subcutaneous Q24H  . feeding supplement (ENSURE ENLIVE)  237 mL Oral BID BM  . gabapentin  300 mg Oral TID  . lamoTRIgine  25 mg Oral Daily  . lisinopril  20 mg Oral Daily  . loratadine  10 mg Oral Daily  . pantoprazole  40 mg Oral Daily  . pneumococcal 23 valent vaccine  0.5 mL Intramuscular Tomorrow-1000  . polyethylene glycol  17 g Oral Daily  . QUEtiapine  100 mg Oral QHS  . QUEtiapine  25 mg Oral Daily  . senna-docusate  2 tablet Oral QHS  . sertraline  100 mg Oral Daily  . tamsulosin  0.4 mg Oral Daily     ASSESSMENT AND PLAN: This is a 55 yo man with history of tobacco, alcohol  and cocaine abuse, Bipolar disorder, arthritis, CKD, GERD, hepatitis C, HTN, presenting with palpitations, chest pain, nausea, and vomiting. The patient found to have SVT by EMS arrival, which was resolved after adenosine cardioversion. Patient stress test today.  Principal Problem:   Palpitations Active Problems:   Polysubstance abuse   Hepatitis C   Bipolar 1 disorder (HCC)   Pain in the chest   Troponin level elevated   Homeless   Essential hypertension   SVT (supraventricular tachycardia) (HCC)   Chest pain: The patient continues to be afebrile. Initial EKG by EMS with ST depressions which have resolved. No acute ischemic changes on EKG in ED. Troponin trended, peak of 0.06 with latest reading of 0.04, mild elevation could be demand ischemia vs non-ACS stress induced. CXR with no acute cardiopulmonary process. UDS negative. CTA chest/abdomen/pelvis with negative aneurysm/dissection and no PE. 2D echo showed normal systolic function, EF 55-60% and grade 1 diastolic dysfunction, with mild-moderate mitral valve regurgitation and L atrium mild dilation.  - Stress test this am, NPO since midnight - patient on telemetry. Patient to receive cardiac monitor per cardiology recs. -  outpatient follow up with cardiology  SVT: no events since admission per telemetry. S/p IV adenosine 6 mg by EMS prior to ED presentation  HTN: Latest BP 117/73 - Continue home lisinopril - d/c metoprolol per cardiology recs - start Cardizem 120 mg daily, uptitrate to 180 mg daily  Chronic hepatitis C infection: AST 43 and ALT 71. Bilirubin normal. History of chronic hepatitis C. Patient to follow up for treatment at [facility] as outpatient  Bipolar d/o: Continue home Depakote, Lamictal, Seroquel, Zoloft  Dispo: Admit patient to Observation with expected discharge tomorrow  Will [employer] Medical Student 8/19/20177:26 AM

## 2015-11-27 NOTE — Progress Notes (Signed)
SUBJECTIVE:  Had mild CP during stress test  OBJECTIVE:   Vitals:   Vitals:   11/27/15 1103 11/27/15 1128 11/27/15 1130 11/27/15 1132  BP: (!) 142/98 136/90 132/87 133/90  Pulse:      Resp:      Temp:      TempSrc:      SpO2:      Weight:      Height:       I&O's:   Intake/Output Summary (Last 24 hours) at 11/27/15 1342 Last data filed at 11/27/15 0858  Gross per 24 hour  Intake              120 ml  Output             1525 ml  Net            -1405 ml   TELEMETRY: Reviewed telemetry pt in NSR with one dropped QRS during sleep     PHYSICAL EXAM General: Well developed, well nourished, in no acute distress Head: Eyes PERRLA, No xanthomas.   Normal cephalic and atramatic  Lungs:   Clear bilaterally to auscultation and percussion. Heart:   HRRR S1 S2 Pulses are 2+ & equal. Abdomen: Bowel sounds are positive, abdomen soft and non-tender without masses  Msk:  Back normal, normal gait. Normal strength and tone for age. Extremities:   No clubbing, cyanosis or edema.  DP +1 Neuro: Alert and oriented X 3. Psych:  Good affect, responds appropriately   LABS: Basic Metabolic Panel:  Recent Labs  30/09/23 0900 11/25/15 0904 11/26/15 0220  NA 139 144 139  K 4.0 4.1 4.0  CL 112* 107 108  CO2 19*  --  25  GLUCOSE 130* 130* 108*  BUN 11 13 11   CREATININE 1.21 1.10 0.76  CALCIUM 9.0  --  8.9   Liver Function Tests:  Recent Labs  11/25/15 0900 11/26/15 0220  AST 52* 43*  ALT 79* 71*  ALKPHOS 48 49  BILITOT 0.6 0.3  PROT 6.2* 6.1*  ALBUMIN 3.7 3.5   No results for input(s): LIPASE, AMYLASE in the last 72 hours. CBC:  Recent Labs  11/25/15 0900 11/25/15 0904  WBC 7.3  --   NEUTROABS 5.2  --   HGB 13.8 14.3  HCT 42.4 42.0  MCV 87.2  --   PLT 275  --    Cardiac Enzymes:  Recent Labs  11/25/15 0900 11/25/15 1528 11/25/15 2049  TROPONINI <0.03 0.06* 0.04*   BNP: Invalid input(s): POCBNP D-Dimer: No results for input(s): DDIMER in the last 72  hours. Hemoglobin A1C:  Recent Labs  11/25/15 1528  HGBA1C 5.8*   Fasting Lipid Panel:  Recent Labs  11/26/15 0220  CHOL 130  HDL 33*  LDLCALC 80  TRIG 84  CHOLHDL 3.9   Thyroid Function Tests: No results for input(s): TSH, T4TOTAL, T3FREE, THYROIDAB in the last 72 hours.  Invalid input(s): FREET3 Anemia Panel: No results for input(s): VITAMINB12, FOLATE, FERRITIN, TIBC, IRON, RETICCTPCT in the last 72 hours. Coag Panel:   No results found for: INR, PROTIME  RADIOLOGY: Dg Chest Portable 1 View  Result Date: 11/25/2015 CLINICAL DATA:  55 year old male with mid lower chest pain and shortness of breath this morning. Dizziness and diaphoresis. Initial encounter. Smoker. EXAM: PORTABLE CHEST 1 VIEW COMPARISON:  09/25/2015 and earlier. FINDINGS: Portable AP supine view at 0856 hours. Normal cardiac size and mediastinal contours. Stable to mildly improved lung volumes. Allowing for portable technique the lungs are  clear. No pneumothorax. No pleural effusion. IMPRESSION: No acute cardiopulmonary abnormality. Electronically Signed   By: Odessa Fleming M.D.   On: 11/25/2015 09:06   Ct Angio Chest/abd/pel For Dissection W And/or Wo Contrast  Result Date: 11/25/2015 CLINICAL DATA:  Chest and abdominal pain radiating toward back EXAM: CT ANGIOGRAPHY CHEST, ABDOMEN AND PELVIS TECHNIQUE: Initially, axial CT images were obtained through the chest without intravenous contrast material administration. Multidetector CT imaging through the chest, abdomen and pelvis was performed using the standard protocol during bolus administration of intravenous contrast. Multiplanar reconstructed images and MIPs were obtained and reviewed to evaluate the vascular anatomy. CONTRAST:  100 mL Isovue 370 nonionic COMPARISON:  Chest CT December 16, 2014; chest radiograph November 25, 2015 ; CT abdomen and pelvis Sep 06, 2012 FINDINGS: CTA CHEST FINDINGS There is no demonstrable thoracic aortic aneurysm or dissection. The  visualized great vessels appear unremarkable. The left and right common carotid arteries arise as a common trunk, an anatomic variant. There are multiple foci of coronary artery calcification evident. Pericardium is not appreciably thickened. There is no demonstrable pulmonary embolus. Visualized thyroid appears within normal limits. There is no apparent adenopathy. There is patchy atelectasis throughout the lungs bilaterally. There may be a slight degree of early interstitial edema in the lower lung zone regions. There is a bulla in the right base region. There are no blastic or lytic bone lesions. Review of the MIP images confirms the above findings. CTA ABDOMEN AND PELVIS FINDINGS There is no demonstrable abdominal aortic aneurysm or dissection. There is atherosclerotic calcification and mild plaque in the distal abdominal aorta without evidence approaching hemodynamically significant obstruction. There is a slight degree of calcification in the proximal right common iliac artery. There is modest calcification in the right hypogastric artery. There is no appreciable narrowing of the major pelvic mesenteric vessels. There is no aneurysm or dissection involving the major pelvic arterial vessels. The celiac and superior mesenteric arteries are widely patent. The inferior mesenteric artery is patent as well. Renal arteries bilaterally are patent. Note that there is a small accessory renal artery on the left which is widely patent. A single renal artery is noted on the right. No liver lesions are appreciable. Gallbladder wall is not appreciably thickened. There is no biliary duct dilatation. Spleen, pancreas, and adrenals appear normal. Kidneys bilaterally show no evidence of mass or hydronephrosis on either side. There is no demonstrable renal or ureteral calculus on either side. The urinary bladder is midline with wall thickness within normal limits. No pelvic mass or pelvic fluid collection is seen. There are a few  prostatic calculi. Prostate and seminal vesicles appear normal. Appendix appears normal. There is no ascites, adenopathy, or abscess in the abdomen or pelvis. No bowel obstruction or bowel wall thickening. No free air or portal venous air. There is degenerative change in the lumbar spine with vacuum phenomenon at L4-5. There are no blastic or lytic bone lesions. Review of the MIP images confirms the above findings. IMPRESSION: CT angiogram chest: . There is no demonstrable thoracic aortic aneurysm or dissection. No pulmonary embolus. There are foci of coronary artery calcification. There are areas of patchy atelectasis. There may be a slight degree of interstitial edema in the bases. There is no airspace consolidation. No adenopathy. CT angiogram abdomen and pelvis: No abdominal aortic or major arterial vessel aneurysm or dissection in the abdomen or pelvis. Mild atherosclerotic calcification and plaque in the distal aorta without hemodynamically significant obstruction. A few scattered foci of  calcification in pelvic arterial vessels without appreciable vessel narrowing. Major mesenteric vessels appear widely patent. Study otherwise essentially unremarkable. No inflammatory focus or abnormal fluid collection. Appendix appears normal. No bowel obstruction or abscess. Electronically Signed   By: Bretta Bang III M.D.   On: 11/25/2015 10:25    ASSESSMENT/PLAN: 1. Palpitations- Apparently had SVT reported by EMS although I cannot find any strips to document this. This terminated after Adenosine 6mg  IV and he has not had any further arrhythmias on tele. He will need an event monitor at discharge to assess for suppression of SVT and evaluate for any other arrhythmias.  We will arrange this.  Would avoid BB use given his history of cocaine.  Started cardizem CD 180mg  daily for suppression of SVT and stop lopressor.  Increasing Cardizem due to elevated BP and mildly elevated HR.  2. Chest painthat sounds  atypical for coronary ischemia and sound more related to his palpitations. He did get diaphoretic but he was out in the heat and that could have been related to SVT. Trop is minimally elevated, and again, could be related demand ischemia in setting of a tachyarrhythmia. Apparently initial EKG by EMS showed ST depression but that EKG is unavailable at this time. That is resolved by EKG in ER. UDS is negative and no PE or aortic dissection on Chest CT. He does have evidence of foci of coronary artery calcification on chest CT. There is no family history of CAD but he is a smoker and therefore at risk for CAD. 2D echo showed normal LVF with EF 55-60% and G1DD and mild to moderate MR. Had nuclear stress test earlier today and results are pending.    3. HTN- BP mildly elevated. Continue  ACE I. Would not use BB therapy given his history of cocaine use.  Increase cardizem CD to 240mg  daily.   4. Mild to moderate MR - repeat echo in 1 year to assess for stability.    , MD  11/27/2015  1:42 PM

## 2015-11-27 NOTE — Progress Notes (Signed)
   Subjective: No acute events overnight. Patient continues to feel well. He denies shortness of breath, chest pain or palpitations. Has some tenderness on his lower abdomen which he thinks could be from falling recently. Having good BM, no dysuria.   Objective:  Vital signs in last 24 hours: Vitals:   11/26/15 1159 11/26/15 2037 11/27/15 0025 11/27/15 0615  BP: (!) 125/93 115/72 (!) 102/58 117/73  Pulse: 95 80 72 90  Resp: 20 18 17 18   Temp: 97.6 F (36.4 C) 97.9 F (36.6 C) 98 F (36.7 C) 98 F (36.7 C)  TempSrc: Oral Oral Oral Oral  SpO2: 98% 97% 99% 99%  Weight:    234 lb 1.6 oz (106.2 kg)  Height:       Physical Exam  Constitutional: He is oriented to person, place, and time. He appears well-developed and well-nourished.  HENT:  Head: Normocephalic and atraumatic.  Cardiovascular: Normal rate and regular rhythm.  Exam reveals no gallop and no friction rub.   No murmur heard. Respiratory: Effort normal and breath sounds normal. No respiratory distress.  GI: Soft. Bowel sounds are normal. He exhibits no distension. There is no tenderness.  No tenderness to palpation  Musculoskeletal: He exhibits no edema.  Neurological: He is alert and oriented to person, place, and time.     Assessment/Plan: Mr. Brodhead is a 55 year old male with history of bipolar, depression, chronic kidney disease, hepatitis C infection who presented with chest pain, palpitations nausea and vomiting found to have supraventricular tachycardia currently status post adenosine cardioversion. Maintaining NSR.   1. Chest Pain and Palpitations -  chest pain/discomfort was likely 2/2 to palpitation/SVT. ACS ruled out, no EKG changes, only mild trop bump peaking at 0.06 which could be explained by SVT episode. 2Decho showed normal EF, mild concentric hypertrophy, grade 1 diastolic dysfunction. Unclear what set off the SVT episode, could be from dehydration/ n/vomitting. - appreciate card recs: nuc stress test  today.  - monitor on tele, will get cardiac monitor on discharge per cards.    2. Hypertension Patient currently normotensive -- Continue metoprolol 25 mg twice a day -- Continue lisinopril 20 mg once daily  3. Bipolar, depression and other psychiatric illness -- Continue home psychiatric medications -- Seroquel 100 mg daily, divalproex 500 mg 24-hour tablet, sertraline 100 mg once daily, trazodone 150 mg at bedtime when necessary for sleep  4.DVT/PE prophylaxis -- lovenox   Dispo: Anticipated discharge - maybe today if stress test is normal.  53, MD 11/27/2015, 10:41 AM Pager: 11/29/2015

## 2015-11-27 NOTE — Progress Notes (Signed)
Pt states he pain is almost gone. Eating more peanut butter nabs. Awake and alert. In no distress. States he feels much better and is ready to eat lunch.

## 2015-11-27 NOTE — Progress Notes (Signed)
Pt complaining of mid sternal chest pain. Statins it's sharp, 7/10. Pt is eating peanut butter nabs and drinking ginger ale. In no apparent distress. Suzzette Righter, PA at bedside. Nitroglycerin 0.4 sl given.

## 2015-11-27 NOTE — Progress Notes (Signed)
Nuclear images reviewed showing inferoapical defect that is mildly reversible.  Given his history of coronary artery calcifications on CT and significant chest pain during SVT will proceed with heart cath.  He has had some CP at other times without palptiations. He has been a smoker as well so risk of CAD high. Will put on list for cath on Monday

## 2015-11-27 NOTE — Progress Notes (Signed)
The patient was seen in nuclear medicine for a LEXISCAN myoview. He tolerated the procedure well. No acute ST or TW changes on ECG.     STRESS IMAGES TO FOLLOW   Suzzette Righter, NP  [facility]

## 2015-11-28 DIAGNOSIS — R002 Palpitations: Secondary | ICD-10-CM | POA: Diagnosis not present

## 2015-11-28 DIAGNOSIS — I471 Supraventricular tachycardia: Secondary | ICD-10-CM | POA: Diagnosis not present

## 2015-11-28 DIAGNOSIS — I10 Essential (primary) hypertension: Secondary | ICD-10-CM | POA: Diagnosis not present

## 2015-11-28 DIAGNOSIS — R079 Chest pain, unspecified: Secondary | ICD-10-CM | POA: Diagnosis not present

## 2015-11-28 DIAGNOSIS — R9439 Abnormal result of other cardiovascular function study: Secondary | ICD-10-CM

## 2015-11-28 DIAGNOSIS — R931 Abnormal findings on diagnostic imaging of heart and coronary circulation: Secondary | ICD-10-CM

## 2015-11-28 DIAGNOSIS — F319 Bipolar disorder, unspecified: Secondary | ICD-10-CM | POA: Diagnosis not present

## 2015-11-28 MED ORDER — SODIUM CHLORIDE 0.9 % WEIGHT BASED INFUSION
3.0000 mL/kg/h | INTRAVENOUS | Status: AC
  Administered 2015-11-29: 3 mL/kg/h via INTRAVENOUS

## 2015-11-28 MED ORDER — SODIUM CHLORIDE 0.9% FLUSH: 3.0000 mL | INTRAVENOUS | Status: DC | PRN

## 2015-11-28 MED ORDER — SODIUM CHLORIDE 0.9% FLUSH: 3.0000 mL | Freq: Two times a day (BID) | INTRAVENOUS | Status: DC

## 2015-11-28 MED ORDER — SODIUM CHLORIDE 0.9 % IV SOLN: 250.0000 mL | INTRAVENOUS | Status: DC | PRN

## 2015-11-28 MED ORDER — SODIUM CHLORIDE 0.9 % WEIGHT BASED INFUSION: 1.0000 mL/kg/h | INTRAVENOUS | Status: DC

## 2015-11-28 MED ORDER — DILTIAZEM HCL ER COATED BEADS 240 MG PO CP24
240.0000 mg | ORAL_CAPSULE | Freq: Every day | ORAL | Status: DC
  Administered 2015-11-29: 240 mg via ORAL
  Filled 2015-11-28: qty 1

## 2015-11-28 MED ORDER — ASPIRIN 81 MG PO CHEW: 81.0000 mg | CHEWABLE_TABLET | ORAL | Status: DC

## 2015-11-28 MED ORDER — KETOROLAC TROMETHAMINE 30 MG/ML IJ SOLN
15.0000 mg | Freq: Once | INTRAMUSCULAR | Status: AC
  Administered 2015-11-28: 15 mg via INTRAVENOUS
  Filled 2015-11-28: qty 1

## 2015-11-28 NOTE — Progress Notes (Signed)
SUBJECTIVE:  No complaints  OBJECTIVE:   Vitals:   Vitals:   11/27/15 1132 11/27/15 1300 11/27/15 2226 11/28/15 0538  BP: 133/90 139/75 130/80 130/73  Pulse:  94 90 96  Resp:  20  18  Temp:  97.4 F (36.3 C) 98.4 F (36.9 C) 97.7 F (36.5 C)  TempSrc:  Oral Oral Oral  SpO2:  100% 98% 99%  Weight:    233 lb 1.6 oz (105.7 kg)  Height:       I&O's:   Intake/Output Summary (Last 24 hours) at 11/28/15 1139 Last data filed at 11/28/15 0511  Gross per 24 hour  Intake              720 ml  Output             1101 ml  Net             -381 ml   TELEMETRY: Reviewed telemetry pt in NSR:     PHYSICAL EXAM General: Well developed, well nourished, in no acute distress Head: Eyes PERRLA, No xanthomas.   Normal cephalic and atramatic  Lungs:   Clear bilaterally to auscultation and percussion. Heart:   HRRR S1 S2 Pulses are 2+ & equal. Abdomen: Bowel sounds are positive, abdomen soft and non-tender without masses  Msk:  Back normal, normal gait. Normal strength and tone for age. Extremities:   No clubbing, cyanosis or edema.  DP +1 Neuro: Alert and oriented X 3. Psych:  Good affect, responds appropriately   LABS: Basic Metabolic Panel:  Recent Labs  29/52/84 0220  NA 139  K 4.0  CL 108  CO2 25  GLUCOSE 108*  BUN 11  CREATININE 0.76  CALCIUM 8.9   Liver Function Tests:  Recent Labs  11/26/15 0220  AST 43*  ALT 71*  ALKPHOS 49  BILITOT 0.3  PROT 6.1*  ALBUMIN 3.5   No results for input(s): LIPASE, AMYLASE in the last 72 hours. CBC: No results for input(s): WBC, NEUTROABS, HGB, HCT, MCV, PLT in the last 72 hours. Cardiac Enzymes:  Recent Labs  11/25/15 1528 11/25/15 2049  TROPONINI 0.06* 0.04*   BNP: Invalid input(s): POCBNP D-Dimer: No results for input(s): DDIMER in the last 72 hours. Hemoglobin A1C:  Recent Labs  11/25/15 1528  HGBA1C 5.8*   Fasting Lipid Panel:  Recent Labs  11/26/15 0220  CHOL 130  HDL 33*  LDLCALC 80  TRIG 84    CHOLHDL 3.9   Thyroid Function Tests: No results for input(s): TSH, T4TOTAL, T3FREE, THYROIDAB in the last 72 hours.  Invalid input(s): FREET3 Anemia Panel: No results for input(s): VITAMINB12, FOLATE, FERRITIN, TIBC, IRON, RETICCTPCT in the last 72 hours. Coag Panel:   No results found for: INR, PROTIME  RADIOLOGY: Nm Myocar Multi W/spect W/wall Motion / Ef  Result Date: 11/27/2015 CLINICAL DATA:  Chest pain, smoker EXAM: MYOCARDIAL IMAGING WITH SPECT (REST AND PHARMACOLOGIC-STRESS) GATED LEFT VENTRICULAR WALL MOTION STUDY LEFT VENTRICULAR EJECTION FRACTION TECHNIQUE: Standard myocardial SPECT imaging was performed after resting intravenous injection of 10 mCi Tc-16m tetrofosmin. Subsequently, intravenous infusion of Lexiscan was performed under the supervision of the Cardiology staff. At peak effect of the drug, 30 mCi Tc-51m tetrofosmin was injected intravenously and standard myocardial SPECT imaging was performed. Quantitative gated imaging was also performed to evaluate left ventricular wall motion, and estimate left ventricular ejection fraction. COMPARISON:  None. FINDINGS: Perfusion: Decreased activity of the apex and inferior wall on the stress views when compared to the  rest views. A component this may be secondary to inferior attenuation artifact however difficult to exclude mild inferior apical ischemia. Wall Motion: Normal left ventricular wall motion. No left ventricular dilation. Left Ventricular Ejection Fraction: 61 % End diastolic volume 103 ml End systolic volume 40 ml IMPRESSION: 1. Inferior apical attenuation artifact versus mild inducible ischemia with pharmacologic stress. 2. Normal left ventricular wall motion. 3. Left ventricular ejection fraction 61% 4. Non invasive risk stratification*: Low to intermediate *2012 Appropriate Use Criteria for Coronary Revascularization Focused Update: J Am Coll Cardiol. 2012;59(9):857-881.  http://[email or web address] Electronically Signed   By: Judie Petit.  Shick M.D.   On: 11/27/2015 13:51   Dg Chest Portable 1 View  Result Date: 11/25/2015 CLINICAL DATA:  55 year old male with mid lower chest pain and shortness of breath this morning. Dizziness and diaphoresis. Initial encounter. Smoker. EXAM: PORTABLE CHEST 1 VIEW COMPARISON:  09/25/2015 and earlier. FINDINGS: Portable AP supine view at 0856 hours. Normal cardiac size and mediastinal contours. Stable to mildly improved lung volumes. Allowing for portable technique the lungs are clear. No pneumothorax. No pleural effusion. IMPRESSION: No acute cardiopulmonary abnormality. Electronically Signed   By: Odessa Fleming M.D.   On: 11/25/2015 09:06   Ct Angio Chest/abd/pel For Dissection W And/or Wo Contrast  Result Date: 11/25/2015 CLINICAL DATA:  Chest and abdominal pain radiating toward back EXAM: CT ANGIOGRAPHY CHEST, ABDOMEN AND PELVIS TECHNIQUE: Initially, axial CT images were obtained through the chest without intravenous contrast material administration. Multidetector CT imaging through the chest, abdomen and pelvis was performed using the standard protocol during bolus administration of intravenous contrast. Multiplanar reconstructed images and MIPs were obtained and reviewed to evaluate the vascular anatomy. CONTRAST:  100 mL Isovue 370 nonionic COMPARISON:  Chest CT December 16, 2014; chest radiograph November 25, 2015 ; CT abdomen and pelvis Sep 06, 2012 FINDINGS: CTA CHEST FINDINGS There is no demonstrable thoracic aortic aneurysm or dissection. The visualized great vessels appear unremarkable. The left and right common carotid arteries arise as a common trunk, an anatomic variant. There are multiple foci of coronary artery calcification evident. Pericardium is not appreciably thickened. There is no demonstrable pulmonary embolus. Visualized thyroid appears within normal limits. There is no apparent adenopathy. There is  patchy atelectasis throughout the lungs bilaterally. There may be a slight degree of early interstitial edema in the lower lung zone regions. There is a bulla in the right base region. There are no blastic or lytic bone lesions. Review of the MIP images confirms the above findings. CTA ABDOMEN AND PELVIS FINDINGS There is no demonstrable abdominal aortic aneurysm or dissection. There is atherosclerotic calcification and mild plaque in the distal abdominal aorta without evidence approaching hemodynamically significant obstruction. There is a slight degree of calcification in the proximal right common iliac artery. There is modest calcification in the right hypogastric artery. There is no appreciable narrowing of the major pelvic mesenteric vessels. There is no aneurysm or dissection involving the major pelvic arterial vessels. The celiac and superior mesenteric arteries are widely patent. The inferior mesenteric artery is patent as well. Renal arteries bilaterally are patent. Note that there is a small accessory renal artery on the left which is widely patent. A single renal artery is noted on the right. No liver lesions are appreciable. Gallbladder wall is not appreciably thickened. There is no biliary duct dilatation. Spleen, pancreas, and adrenals appear normal. Kidneys bilaterally show no evidence of mass or hydronephrosis on either side. There is no demonstrable renal or ureteral calculus on either  side. The urinary bladder is midline with wall thickness within normal limits. No pelvic mass or pelvic fluid collection is seen. There are a few prostatic calculi. Prostate and seminal vesicles appear normal. Appendix appears normal. There is no ascites, adenopathy, or abscess in the abdomen or pelvis. No bowel obstruction or bowel wall thickening. No free air or portal venous air. There is degenerative change in the lumbar spine with vacuum phenomenon at L4-5. There are no blastic or lytic bone lesions. Review of  the MIP images confirms the above findings. IMPRESSION: CT angiogram chest: . There is no demonstrable thoracic aortic aneurysm or dissection. No pulmonary embolus. There are foci of coronary artery calcification. There are areas of patchy atelectasis. There may be a slight degree of interstitial edema in the bases. There is no airspace consolidation. No adenopathy. CT angiogram abdomen and pelvis: No abdominal aortic or major arterial vessel aneurysm or dissection in the abdomen or pelvis. Mild atherosclerotic calcification and plaque in the distal aorta without hemodynamically significant obstruction. A few scattered foci of calcification in pelvic arterial vessels without appreciable vessel narrowing. Major mesenteric vessels appear widely patent. Study otherwise essentially unremarkable. No inflammatory focus or abnormal fluid collection. Appendix appears normal. No bowel obstruction or abscess. Electronically Signed   By: Bretta Bang III M.D.   On: 11/25/2015 10:25    ASSESSMENT/PLAN: 1. Palpitations- Apparently had SVT reported by EMSalthough I cannot find any strips to document this. This terminated after Adenosine 6mg  IV and he has not had any further arrhythmias on tele. He will need an event monitor at discharge to assess for suppression of SVT and evaluate for any otherarrhythmias. We will arrange this. Would avoid BB use given his history of cocaine. Starteded cardizem CD 180mg  daily for suppression of SVT and stopped lopressor.  HR still intermittently elevated so will increase Cardizem CD to 240mg  daily.  2. Chest painthat sounds atypical for coronary ischemia and sound more related to his palpitations. He did get diaphoretic but he was out in the heat and that could have been related to SVT. Trop is minimally elevated, and again, could be related demand ischemia in setting of a tachyarrhythmia. Apparently initial EKG by EMS showed ST depression but that EKG is unavailable at  this time. That is resolved by EKG in ER. UDS is negative and no PE or aortic dissection on Chest CT. He does have evidence of foci of coronary artery calcification on chest CT. There is no family history of CAD but he is a smoker and therefore at risk for CAD. 2D echo showed normal LVF with EF 55-60% and G1DD and mild to moderate MR. Had nuclear stress test showing inferoapical defect that is mildly reversible.  Given his history of coronary artery calcifications on CT and significant chest pain during SVT will proceed with heart cath.  He has had some CP at other times without palptiations. He has been a smoker as well so risk of CAD high. Plan cath tomorrow.  Cardiac catheterization was discussed with the patient fully. The patient understands that risks include but are not limited to stroke (1 in 1000), death (1 in 1000), kidney failure [usually temporary] (1 in 500), bleeding (1 in 200), allergic reaction [possibly serious] (1 in 200).  The patient understands and is willing to proceed.    3. HTN- BP controlled. Continue ACE I. Would not use BB therapy given his history of cocaine use.Continue cardizem CD   4. Mild to moderate MR - repeat  echo in 1 year to assess for stability.      Armanda Magic, MD  11/28/2015  11:39 AM

## 2015-11-28 NOTE — Progress Notes (Signed)
   Subjective: No acute events overnight. Patient continues to feel well. [name] denies shortness of breath. Had lot of questions about cath tomorrow and planning from there.   Objective:  Vital signs in last 24 hours: Vitals:   11/27/15 1132 11/27/15 1300 11/27/15 2226 11/28/15 0538  BP: 133/90 139/75 130/80 130/73  Pulse:  94 90 96  Resp:  20  18  Temp:  97.4 F (36.3 C) 98.4 F (36.9 C) 97.7 F (36.5 C)  TempSrc:  Oral Oral Oral  SpO2:  100% 98% 99%  Weight:    233 lb 1.6 oz (105.7 kg)  Height:       Physical Exam  Constitutional: [name] is oriented to person, place, and time. [name] appears well-developed and well-nourished.  HENT:  Head: Normocephalic and atraumatic.  Cardiovascular: Normal rate and regular rhythm.  Exam reveals no gallop and no friction rub.   No murmur heard. Respiratory: Effort normal and breath sounds normal. No respiratory distress.  GI: Soft. Bowel sounds are normal. [name] exhibits no distension. There is no tenderness.  No tenderness to palpation  Musculoskeletal: [name] exhibits no edema.  Neurological: [name] is alert and oriented to person, place, and time.     Assessment/Plan: [name] is a 55 year old male with history of bipolar, depression, chronic kidney disease, hepatitis C infection who presented with chest pain, palpitations nausea and vomiting found to have supraventricular tachycardia currently status post adenosine cardioversion. Maintaining NSR.   1. Chest Pain and Palpitations -  chest pain/discomfort was likely 2/2 to palpitation/SVT. ACS ruled out, no EKG changes, only mild trop bump peaking at 0.06 which could be explained by SVT episode. 2Decho showed normal EF, mild concentric hypertrophy, grade 1 diastolic dysfunction.  Stress test showed inferoapical defect that is mildly reversible. Unclear what set off the SVT episode, could be from dehydration/ n/vomitting. However, agree with cardilogy to rule out ischemic etiology with heart cath. -  appreciate card recs: heart cath Monday.  - monitor on tele, will get cardiac monitor on discharge per cards.   2. Hypertension Patient currently normotensive -- Continue metoprolol 25 mg twice a day -- Continue lisinopril 20 mg once daily -- continue cardizem 180 mg daily   3. Bipolar, depression and other psychiatric illness -- Continue home psychiatric medications -- Seroquel 100 mg daily, divalproex 500 mg 24-hour tablet, sertraline 100 mg once daily, trazodone 150 mg at bedtime when necessary for sleep  4.DVT/PE prophylaxis -- lovenox   Dispo: Anticipated discharge 1-2 days.   Hyacinth Meeker, MD 11/28/2015, 11:04 AM Pager: [phone]

## 2015-11-28 NOTE — Progress Notes (Addendum)
Visited patient, c/o 8/10 sharp abdominal pain new this evening refractory to prn Tylenol and GI cocktail. Pain symmetric at RLQ and LLQ. On exam BS+ in all quadrants, soft, non-distended, moderately tender to palpation, no flank pain, mild guarding possibly voluntary, no rebound, patient HDS no tachycardia/hypertension - no concern for acute abdomen at this time. Mentions that he has been taking senokot/miralax and had two loose stools today. Ordered IV Toradol 15mg  x1 - discussed with patient and RN.

## 2015-11-29 ENCOUNTER — Encounter (HOSPITAL_COMMUNITY)
Admission: EM | Disposition: A | Discharge status: Home / Self Care | Payer: Self-pay | Source: Home / Self Care | Attending: Emergency Medicine

## 2015-11-29 DIAGNOSIS — R079 Chest pain, unspecified: Secondary | ICD-10-CM | POA: Diagnosis not present

## 2015-11-29 DIAGNOSIS — I2510 Atherosclerotic heart disease of native coronary artery without angina pectoris: Secondary | ICD-10-CM

## 2015-11-29 DIAGNOSIS — I471 Supraventricular tachycardia: Secondary | ICD-10-CM | POA: Diagnosis not present

## 2015-11-29 DIAGNOSIS — I25110 Atherosclerotic heart disease of native coronary artery with unstable angina pectoris: Secondary | ICD-10-CM | POA: Diagnosis not present

## 2015-11-29 DIAGNOSIS — R002 Palpitations: Secondary | ICD-10-CM

## 2015-11-29 HISTORY — PX: CARDIAC CATHETERIZATION: SHX172

## 2015-11-29 LAB — BASIC METABOLIC PANEL
Anion gap: 9 (ref 5–15)
BUN: 12 mg/dL (ref 6–20)
CO2: 24 mmol/L (ref 22–32)
Calcium: 9.2 mg/dL (ref 8.9–10.3)
Chloride: 104 mmol/L (ref 101–111)
Creatinine, Ser: 0.87 mg/dL (ref 0.61–1.24)
GFR calc Af Amer: >60 mL/min (ref >60)
GFR calc non Af Amer: >60 mL/min (ref >60)
Glucose, Bld: 94 mg/dL (ref 65–99)
Potassium: 4.1 mmol/L (ref 3.5–5.1)
Sodium: 137 mmol/L (ref 135–145)

## 2015-11-29 LAB — PROTIME-INR
INR: 1.04
Prothrombin Time: 13.6 seconds (ref 11.4–15.2)

## 2015-11-29 SURGERY — LEFT HEART CATH AND CORONARY ANGIOGRAPHY
Anesthesia: LOCAL

## 2015-11-29 MED ORDER — FENTANYL CITRATE (PF) 100 MCG/2ML IJ SOLN
INTRAMUSCULAR | Status: DC | PRN
  Administered 2015-11-29: 25 ug via INTRAVENOUS

## 2015-11-29 MED ORDER — HEPARIN SODIUM (PORCINE) 1000 UNIT/ML IJ SOLN
INTRAMUSCULAR | Status: DC | PRN
  Administered 2015-11-29: 5000 [IU] via INTRAVENOUS

## 2015-11-29 MED ORDER — SODIUM CHLORIDE 0.9% FLUSH: 3.0000 mL | Freq: Two times a day (BID) | INTRAVENOUS | Status: DC

## 2015-11-29 MED ORDER — HEPARIN (PORCINE) IN NACL 2-0.9 UNIT/ML-% IJ SOLN
INTRAMUSCULAR | Status: AC
  Filled 2015-11-29: qty 1000

## 2015-11-29 MED ORDER — HEPARIN (PORCINE) IN NACL 2-0.9 UNIT/ML-% IJ SOLN
INTRAMUSCULAR | Status: DC | PRN
  Administered 2015-11-29: 09:00:00

## 2015-11-29 MED ORDER — NITROGLYCERIN 1 MG/10 ML FOR IR/CATH LAB
INTRA_ARTERIAL | Status: AC
  Filled 2015-11-29: qty 10

## 2015-11-29 MED ORDER — SODIUM CHLORIDE 0.9% FLUSH: 3.0000 mL | INTRAVENOUS | Status: DC | PRN

## 2015-11-29 MED ORDER — SODIUM CHLORIDE 0.9 % WEIGHT BASED INFUSION: 3.0000 mL/kg/h | INTRAVENOUS | Status: AC

## 2015-11-29 MED ORDER — MIDAZOLAM HCL 2 MG/2ML IJ SOLN
INTRAMUSCULAR | Status: AC
  Filled 2015-11-29: qty 2

## 2015-11-29 MED ORDER — ACETAMINOPHEN 325 MG PO TABS: 650.0000 mg | ORAL_TABLET | ORAL | Status: DC | PRN

## 2015-11-29 MED ORDER — IOPAMIDOL (ISOVUE-370) INJECTION 76%
INTRAVENOUS | Status: AC
  Filled 2015-11-29: qty 100

## 2015-11-29 MED ORDER — HEPARIN SODIUM (PORCINE) 5000 UNIT/ML IJ SOLN: 5000.0000 [IU] | Freq: Three times a day (TID) | INTRAMUSCULAR | Status: DC

## 2015-11-29 MED ORDER — VERAPAMIL HCL 2.5 MG/ML IV SOLN
INTRAVENOUS | Status: AC
  Filled 2015-11-29: qty 2

## 2015-11-29 MED ORDER — FENTANYL CITRATE (PF) 100 MCG/2ML IJ SOLN
INTRAMUSCULAR | Status: AC
  Filled 2015-11-29: qty 2

## 2015-11-29 MED ORDER — HEPARIN SODIUM (PORCINE) 1000 UNIT/ML IJ SOLN
INTRAMUSCULAR | Status: AC
  Filled 2015-11-29: qty 1

## 2015-11-29 MED ORDER — SODIUM CHLORIDE 0.9 % IV SOLN: 250.0000 mL | INTRAVENOUS | Status: DC | PRN

## 2015-11-29 MED ORDER — MIDAZOLAM HCL 2 MG/2ML IJ SOLN
INTRAMUSCULAR | Status: DC | PRN
  Administered 2015-11-29: 1 mg via INTRAVENOUS

## 2015-11-29 MED ORDER — VERAPAMIL HCL 2.5 MG/ML IV SOLN
INTRAVENOUS | Status: DC | PRN
  Administered 2015-11-29: 08:00:00 via INTRA_ARTERIAL

## 2015-11-29 MED ORDER — ONDANSETRON HCL 4 MG/2ML IJ SOLN: 4.0000 mg | Freq: Four times a day (QID) | INTRAMUSCULAR | Status: DC | PRN

## 2015-11-29 MED ORDER — IOPAMIDOL (ISOVUE-370) INJECTION 76%
INTRAVENOUS | Status: DC | PRN
  Administered 2015-11-29: 140 mL

## 2015-11-29 MED ORDER — DILTIAZEM HCL ER COATED BEADS 240 MG PO CP24: 240.0000 mg | ORAL_CAPSULE | Freq: Every day | ORAL | 3 refills | Status: DC

## 2015-11-29 MED ORDER — IOPAMIDOL (ISOVUE-370) INJECTION 76%
INTRAVENOUS | Status: AC
  Filled 2015-11-29: qty 50

## 2015-11-29 MED ORDER — LIDOCAINE HCL (PF) 1 % IJ SOLN
INTRAMUSCULAR | Status: AC
  Filled 2015-11-29: qty 30

## 2015-11-29 MED ORDER — LIDOCAINE HCL (PF) 1 % IJ SOLN
INTRAMUSCULAR | Status: DC | PRN
  Administered 2015-11-29: 2 mL via SUBCUTANEOUS

## 2015-11-29 SURGICAL SUPPLY — 14 items
CATH INFINITI 5 FR JL3.5 (CATHETERS) ×1 IMPLANT
CATH INFINITI 5 FR LCB (CATHETERS) ×1 IMPLANT
CATH INFINITI 5FR AL1 (CATHETERS) ×1 IMPLANT
CATH INFINITI 5FR ANG PIGTAIL (CATHETERS) ×1 IMPLANT
CATH INFINITI JR4 5F (CATHETERS) ×1 IMPLANT
CATH SITESEER 5F NTR (CATHETERS) ×1 IMPLANT
DEVICE RAD COMP TR BAND LRG (VASCULAR PRODUCTS) ×1 IMPLANT
GLIDESHEATH SLEND SS 6F .021 (SHEATH) ×1 IMPLANT
GUIDE CATH RUNWAY 6FR LCB (CATHETERS) ×1 IMPLANT
KIT HEART LEFT (KITS) ×2 IMPLANT
PACK CARDIAC CATHETERIZATION (CUSTOM PROCEDURE TRAY) ×2 IMPLANT
TRANSDUCER W/STOPCOCK (MISCELLANEOUS) ×2 IMPLANT
TUBING CIL FLEX 10 FLL-RA (TUBING) ×2 IMPLANT
WIRE SAFE-T 1.5MM-J .035X260CM (WIRE) ×1 IMPLANT

## 2015-11-29 NOTE — Interval H&P Note (Signed)
Cath Lab Visit (complete for each Cath Lab visit)  Clinical Evaluation Leading to the Procedure:   ACS: Yes.    Non-ACS:    Anginal Classification: CCS III  Anti-ischemic medical therapy: Minimal Therapy (1 class of medications)  Non-Invasive Test Results: Intermediate-risk stress test findings: cardiac mortality 1-3%/year  Prior CABG: No previous CABG      History and Physical Interval Note:  11/29/2015 8:17 AM  [name]  has presented today for surgery, with the diagnosis of unstable angina  The various methods of treatment have been discussed with the patient and family. After consideration of risks, benefits and other options for treatment, the patient has consented to  Procedure(s): Left Heart Cath and Coronary Angiography (N/A) as a surgical intervention .  The patient's history has been reviewed, patient examined, no change in status, stable for surgery.  I have reviewed the patient's chart and labs.  Questions were answered to the patient's satisfaction.     Gurman Ashland [employer]

## 2015-11-29 NOTE — Progress Notes (Signed)
Primary Cardiologist:  Armanda Magic, MD    Patient Profile: 55yo male with a history of polysubstance abuse (cocaine/ETOH/tob), GERD, bipolar affective disorder, Hep C, CKD who presented to the ER with complaints of palpitations and chest pain.  Was in SVT per EMS report, but converted to NSR after IV adenosine. Troponin minimally elevated. NST with Inferior apical attenuation artifact versus mild inducible ischemia with pharmacologic stress>> LHC with diffuse coronary disease but no hemodynamically significant blockages.   Subjective: Feels fine. Denies any palpitations. Still with radial band on.   Objective: Vital signs in last 24 hours: Temp:  [97.9 F (36.6 C)-98.4 F (36.9 C)] 97.9 F (36.6 C) (08/21 0624) Pulse Rate:  [67-89] 80 (08/21 0907) Resp:  [10-20] 14 (08/21 0907) BP: (108-135)/(65-89) 133/89 (08/21 0907) SpO2:  [91 %-100 %] 98 % (08/21 0907) Weight:  [232 lb 3.2 oz (105.3 kg)] 232 lb 3.2 oz (105.3 kg) (08/21 0624) Last BM Date: 11/28/15  Intake/Output from previous day: 08/20 0701 - 08/21 0700 In: 720 [P.O.:720] Out: 3275 [Urine:3275] Intake/Output this shift: No intake/output data recorded.  Medications Current Facility-Administered Medications  Medication Dose Route Frequency Provider Last Rate Last Dose  . 0.9 %  sodium chloride infusion  250 mL Intravenous PRN Laurey Morale, MD      . 0.9% sodium chloride infusion  3 mL/kg/hr Intravenous Continuous Laurey Morale, MD 315.9 mL/hr at 11/29/15 0933 3 mL/kg/hr at 11/29/15 0933  . acetaminophen (TYLENOL) tablet 650 mg  650 mg Oral Q4H PRN Laurey Morale, MD      . amantadine (SYMMETREL) capsule 100 mg  100 mg Oral Daily Lora Paula, MD   100 mg at 11/29/15 1205  . diclofenac (VOLTAREN) EC tablet 50 mg  50 mg Oral TID Lora Paula, MD   50 mg at 11/29/15 1206  . diltiazem (CARDIZEM CD) 24 hr capsule 240 mg  240 mg Oral Daily Quintella Reichert, MD   240 mg at 11/29/15 1203  . divalproex (DEPAKOTE ER)  24 hr tablet 500 mg  500 mg Oral Daily Lora Paula, MD   500 mg at 11/29/15 1204  . feeding supplement (ENSURE ENLIVE) (ENSURE ENLIVE) liquid 237 mL  237 mL Oral BID BM Nischal Narendra, MD   237 mL at 11/29/15 1208  . gabapentin (NEURONTIN) capsule 300 mg  300 mg Oral TID Lora Paula, MD   300 mg at 11/29/15 1207  . gi cocktail (Maalox,Lidocaine,Donnatal)  30 mL Oral QID PRN Lora Paula, MD   30 mL at 11/28/15 1555  . heparin injection 5,000 Units  5,000 Units Subcutaneous Q8H Laurey Morale, MD      . hydrOXYzine (ATARAX/VISTARIL) tablet 25 mg  25 mg Oral TID PRN Lora Paula, MD      . lamoTRIgine (LAMICTAL) tablet 25 mg  25 mg Oral Daily Lora Paula, MD   25 mg at 11/29/15 1204  . lisinopril (PRINIVIL,ZESTRIL) tablet 20 mg  20 mg Oral Daily Lora Paula, MD   20 mg at 11/29/15 1203  . loratadine (CLARITIN) tablet 10 mg  10 mg Oral Daily Lora Paula, MD   10 mg at 11/29/15 1205  . nitroGLYCERIN (NITROSTAT) SL tablet 0.4 mg  0.4 mg Sublingual Q5 min PRN Little Ishikawa, NP   0.4 mg at 11/27/15 1153  . ondansetron (ZOFRAN) injection 4 mg  4 mg Intravenous Q6H PRN Laurey Morale, MD      . pantoprazole (  PROTONIX) EC tablet 40 mg  40 mg Oral Daily Lora Paula, MD   40 mg at 11/29/15 1205  . pneumococcal 23 valent vaccine (PNU-IMMUNE) injection 0.5 mL  0.5 mL Intramuscular Tomorrow-1000 Nischal Narendra, MD      . polyethylene glycol (MIRALAX / GLYCOLAX) packet 17 g  17 g Oral Daily Lora Paula, MD   17 g at 11/28/15 1100  . QUEtiapine (SEROQUEL) tablet 100 mg  100 mg Oral QHS Lora Paula, MD   100 mg at 11/28/15 2134  . QUEtiapine (SEROQUEL) tablet 25 mg  25 mg Oral Daily Lora Paula, MD   25 mg at 11/29/15 1203  . senna-docusate (Senokot-S) tablet 2 tablet  2 tablet Oral QHS Lora Paula, MD   2 tablet at 11/28/15 2134  . sertraline (ZOLOFT) tablet 100 mg  100 mg Oral Daily Lora Paula, MD   100 mg at 11/29/15 1206  . sodium chloride  flush (NS) 0.9 % injection 3 mL  3 mL Intravenous Q12H Laurey Morale, MD      . sodium chloride flush (NS) 0.9 % injection 3 mL  3 mL Intravenous PRN Laurey Morale, MD      . tamsulosin (FLOMAX) capsule 0.4 mg  0.4 mg Oral Daily Lora Paula, MD   0.4 mg at 11/29/15 1204  . traZODone (DESYREL) tablet 150 mg  150 mg Oral QHS PRN Lora Paula, MD   150 mg at 11/28/15 2134    PE: General appearance: alert, cooperative and no distress Neck: no carotid bruit and no JVD Lungs: clear to auscultation bilaterally Heart: regular rate and rhythm, S1, S2 normal, no murmur, click, rub or gallop Extremities: no LEE Pulses: 2+ and symmetric Skin: warm and dry Neurologic: Grossly normal  Lab Results:  No results for input(s): WBC, HGB, HCT, PLT in the last 72 hours. BMET  Recent Labs  11/29/15 0555  NA 137  K 4.1  CL 104  CO2 24  GLUCOSE 94  BUN 12  CREATININE 0.87  CALCIUM 9.2   PT/INR  Recent Labs  11/29/15 0552  LABPROT 13.6  INR 1.04    Studies/Results: Procedures   Left Heart Cath and Coronary Angiography  Conclusion   Diffuse coronary disease but there do not appear to be any hemodynamically significant blockages.  Medical management.      Assessment/Plan  Principal Problem:   Palpitations Active Problems:   Polysubstance abuse   Hepatitis C   Bipolar 1 disorder (HCC)   Pain in the chest   Troponin level elevated   Homeless   Essential hypertension   SVT (supraventricular tachycardia) (HCC)   Abnormal nuclear stress test   1. SVT: per EMS report enroute to hospital. He reportedly broke with IV adenosine. Rhythm strips not available. HR currently NS in the 80s. Per Dr. Mayford Knife, he will need an event monitor to assess for recurrence. No BB given h/o cocaine use. Continue Cardizem. We will arrange outpatient monitor.   2. Chest Pain: troponin mildly elevated and NST abnormal, however definitive LHC showed only diffuse CAD that is not hemodynamically  significant. LVEF normal at 55-60%. Medical management recommended. LDL is at 80 mg/dL. No BB given cocaine history. Continue lisinopril for management of BP.    LOS: 0 days    Brittainy M. Delmer Islam 11/29/2015 12:14 PM   Attending Note:   The patient was seen and examined.  Agree with assessment and plan as noted above.  Changes made to the above note as needed.  Patient seen and independently examined with Robbie Lis, PA .   We discussed all aspects of the encounter. I agree with the assessment and plan as stated above.  Pt ha moderate CAD by cath.  Has SVT.   I'm hesitant to give beta blockers due to his persistent hx of cocaine use.  I advised him to avoid cocaine      I dont think he will need an event monitor - he was very symptomatic with his SVT .   Explained Valsalva manuver for his SVT  Follow up with Dr. Mayford Knife if he needs cardiology follow up .   He may be discharged to home from my standpoint.    I have spent a total of 40 minutes with patient reviewing hospital  notes , telemetry, EKGs, labs and examining patient as well as establishing an assessment and plan that was discussed with the patient. > 50% of time was spent in direct patient care.    Vesta Mixer, Montez Hageman., MD, [facility] 11/29/2015, 1:37 PM [address],  [address] Office [phone] Pager [phone]

## 2015-11-29 NOTE — Progress Notes (Signed)
1500 seen by Dr Lloyd Huger for discharge . IM MD aware with orders

## 2015-11-29 NOTE — Progress Notes (Signed)
SUBJECTIVE: Patient complained of bilateral lower quadrant abdominal pain yesterday evening. He was given a GI cocktail and Toradol which improved his pain. Mild chest pain this morning, no other acute complaints or concerns.   BP 108/65 (BP Location: Right Arm)   Pulse 70   Temp 97.9 F (36.6 C) (Oral)   Resp 17   Ht _0  (1.575 m)   Wt 105.3 kg (232 lb 3.2 oz) Comment: b scale  SpO2 97%   BMI 42.47 kg/m   Intake/Output Summary (Last 24 hours) at 11/29/15 0839 Last data filed at 11/29/15 9381  Gross per 24 hour  Intake              720 ml  Output             3275 ml  Net            -2555 ml    PHYSICAL EXAM General appearance: alert, cooperative and no distress Lungs: clear to auscultation bilaterally Heart: regular rate and rhythm, S1, S2 normal, no murmur, click, rub or gallop Abdomen: mild tenderness to palpation in RUQ and RLQ, no guarding, soft, non-distended, normoactive bowel sounds Extremities: extremities normal, atraumatic, no cyanosis or edema and no bleeding, edema, or erythema in catheter access site on R forearm  Neuro: alert and oriented x3   LABS/IMAGING: Results for orders placed or performed during the hospital encounter of 11/25/15 (from the past 48 hour(s))  Protime-INR     Status: None   Collection Time: 11/29/15  5:52 AM  Result Value Ref Range   Prothrombin Time 13.6 11.4 - 15.2 seconds   INR 0.17   Basic metabolic panel     Status: None   Collection Time: 11/29/15  5:55 AM  Result Value Ref Range   Sodium 137 135 - 145 mmol/L   Potassium 4.1 3.5 - 5.1 mmol/L   Chloride 104 101 - 111 mmol/L   CO2 24 22 - 32 mmol/L   Glucose, Bld 94 65 - 99 mg/dL   BUN 12 6 - 20 mg/dL   Creatinine, Ser 0.87 0.61 - 1.24 mg/dL   Calcium 9.2 8.9 - 10.3 mg/dL   GFR calc non Af Amer >60 >60 mL/min   GFR calc Af Amer >60 >60 mL/min    Comment: (NOTE) The eGFR has been calculated using the CKD EPI equation. This calculation has not been validated in all clinical  situations. eGFR's persistently <60 mL/min signify possible Chronic Kidney Disease.    Anion gap 9 5 - 15   Left Heart Cath and Coronary Angiography 11/29/15 Diffuse coronary disease but there do not appear to be any hemodynamically significant blockages.  Medical management   Current Meds: . [MAR Hold] amantadine  100 mg Oral Daily  . aspirin  81 mg Oral Pre-Cath  . [MAR Hold] diclofenac  50 mg Oral TID  . [MAR Hold] diltiazem  240 mg Oral Daily  . [MAR Hold] divalproex  500 mg Oral Daily  . [MAR Hold] enoxaparin (LOVENOX) injection  40 mg Subcutaneous Q24H  . [MAR Hold] feeding supplement (ENSURE ENLIVE)  237 mL Oral BID BM  . [MAR Hold] gabapentin  300 mg Oral TID  . [MAR Hold] lamoTRIgine  25 mg Oral Daily  . [MAR Hold] lisinopril  20 mg Oral Daily  . [MAR Hold] loratadine  10 mg Oral Daily  . [MAR Hold] pantoprazole  40 mg Oral Daily  . [MAR Hold] pneumococcal 23 valent vaccine  0.5 mL Intramuscular  Tomorrow-1000  . [MAR Hold] polyethylene glycol  17 g Oral Daily  . [MAR Hold] QUEtiapine  100 mg Oral QHS  . [MAR Hold] QUEtiapine  25 mg Oral Daily  . [MAR Hold] senna-docusate  2 tablet Oral QHS  . [MAR Hold] sertraline  100 mg Oral Daily  . sodium chloride flush  3 mL Intravenous Q12H  . [MAR Hold] tamsulosin  0.4 mg Oral Daily   ASSESSMENT AND PLAN: This is a 55 yo man with history of tobacco, alcohol and cocaine abuse, Bipolar disorder, arthritis, CKD, GERD, hepatitis C, HTN, presenting with palpitations,chest pain, nausea, and vomiting. The patient found to have SVT by EMS arrival, which was resolved after adenosine cardioversion.  Principal Problem:   Palpitations Active Problems:   Polysubstance abuse   Hepatitis C   Bipolar 1 disorder (HCC)   Pain in the chest   Troponin level elevated   Homeless   Essential hypertension   SVT (supraventricular tachycardia) (HCC)   Abnormal nuclear stress test  Chest pain: The patient continues to be afebrile.Initial EKG by  EMS with ST depressions which have resolved. No acute ischemic changes on EKG in ED. Troponin trended, peak of 0.06 with latest reading of 0.04, mild elevation could be demand ischemia vs non-ACS stress induced. CXR with no acute cardiopulmonary process. UDS negative. CTA chest/abdomen/pelvis with negative aneurysm/dissection and no PE. 2D echo showed normal systolic function, EF 75-30% and grade 1 diastolic dysfunction, with mild-moderate mitral valve regurgitation and L atrium mild dilation. Stress test showed mildly reversible inferoapical ischemia, cardiac catheterization and angiography today showed Diffuse coronary disease but there do not appear to be any hemodynamically significant blockages. Cardiology recommends medical management.  - patient on telemetry. Patient to receive cardiac monitor per cardiology recs. - outpatient follow up with cardiology - start patient on atorvastatin 40 mg daily and aspirin 81 mg daily  SVT:  S/p IV adenosine 6 mg by EMS prior to ED presentation  HTN: Latest BP 133/89 - Continue home lisinopril - d/c metoprolol per cardiology recs - Continue Cardizem 180 mg daily  Chronic hepatitis C infection: AST 43and ALT 71. Bilirubin normal. History of chronic hepatitis C. Patient to follow up for [facility] at [facility] as [facility]  Bipolar d/o: Continue home Depakote, Lamictal, Seroquel, Zoloft  Dispo: Admit patient to Observation with expected discharge tomorrow  Will Webster Student 8/21/20178:39 AM

## 2015-11-29 NOTE — Discharge Summary (Signed)
Name: [name] MRN: [id number] DOB: 1960-09-18 55 y.o. PCP: [facility]  Date of Admission: 11/25/2015  8:30 AM Date of Discharge: 11/29/2015 Attending Physician: Earl Lagos, MD  Discharge Diagnosis: 1. Chest Pain  Discharge Medications:   Medication List    STOP taking these medications   metoprolol tartrate 25 MG tablet Commonly known as:  LOPRESSOR   omeprazole 20 MG capsule Commonly known as:  PRILOSEC     TAKE these medications   amantadine 100 MG capsule Commonly known as:  SYMMETREL Take 100 mg by mouth daily.   cetirizine 10 MG tablet Commonly known as:  ZYRTEC Take 10 mg by mouth daily.   diclofenac 50 MG EC tablet Commonly known as:  VOLTAREN Take 50 mg by mouth 3 (three) times daily.   diltiazem 240 MG 24 hr capsule Commonly known as:  CARDIZEM CD Take 1 capsule (240 mg total) by mouth daily.   divalproex 500 MG 24 hr tablet Commonly known as:  DEPAKOTE ER Take 500 mg by mouth daily.   gabapentin 300 MG capsule Commonly known as:  NEURONTIN Take 1 capsule (300 mg total) by mouth 3 (three) times daily.   hydrOXYzine 25 MG tablet Commonly known as:  ATARAX/VISTARIL Take 1 tablet (25 mg total) by mouth 3 (three) times daily as needed for anxiety.   lamoTRIgine 25 MG tablet Commonly known as:  LAMICTAL Take 25 mg by mouth daily.   lisinopril 20 MG tablet Commonly known as:  PRINIVIL,ZESTRIL Take 20 mg by mouth daily.   pantoprazole 40 MG tablet Commonly known as:  PROTONIX Take 1 tablet (40 mg total) by mouth daily.   QUEtiapine 100 MG tablet Commonly known as:  SEROQUEL Take 100 mg by mouth at bedtime. What changed:  Another medication with the same name was removed. Continue taking this medication, and follow the directions you see here.   sertraline 100 MG tablet Commonly known as:  ZOLOFT Take 100 mg by mouth daily.   tamsulosin 0.4 MG Caps capsule Commonly known as:  FLOMAX Take 1 capsule (0.4 mg  total) by mouth daily.   traZODone 100 MG tablet Commonly known as:  DESYREL Take 1 tablet (100 mg total) by mouth at bedtime as needed for sleep. What changed:  how much to take  when to take this   zolpidem 10 MG tablet Commonly known as:  AMBIEN Take 10 mg by mouth at bedtime.       Disposition and follow-up:   Mr.Jozsef A Branton was discharged from [facility] in Good condition.  At the hospital follow up visit please address:  1.  Please ensure the patient follows up with cardiology appointment. Additionally, please discuss with the patient how to eat a heart healthy diet.  2.  Labs / imaging needed at time of follow-up: None  3.  Pending labs/ test needing follow-up: None  Follow-up Appointments: 1. Please schedule an appointment to see her physicians at the [facility] 2 . Follow-up appointment with your cardiologist. They will call you to schedule this appointment.  Hospital Course by problem list: Principal Problem:   Palpitations Active Problems:   Polysubstance abuse   Hepatitis C   Bipolar 1 disorder (HCC)   Pain in the chest   Troponin level elevated   Homeless   Essential hypertension   SVT (supraventricular tachycardia) (HCC)   Abnormal nuclear stress test   1. Chest Pain [name] presented to the [facility] emergency department  on 11/25/2015 with a complaint of chest pain and palpitations. Before arrival the patient called EMS and when they arrived at the scene the patient was in supraventricular tachycardia. He was chemically cardioverted with adenosine 6 mg 1. At this time the patient was endorsing chest pain, shortness of breath, nausea and abdominal pain. He was also noted to be diaphoretic. Upon arrival to the emergency department the patient was hemodynamically stable and endorsing chest pain. He had a chest x-ray which showed no acute cardiopulmonary process. UDS was negative. A CTA chest/abdomen/pelvis was negative for aneurysm or  dissection and there was no evidence of pulmonary embolism. He had an EKG which did not show any acute ST segment elevations. Additionally, troponins were drawn which peaked at 0.06. Cardiology was consulted and a thorough evaluation of the patient's chest plain including echocardiogram, nuclear stress test and left heart catheterization were performed to determine the etiology of the patient's chest pain. Results of echocardiogram demonstrated a left ventricular ejection fraction of 55-60%. Nuclear stress test showed decreased perfusion on the apex and inferior wall. Based on the results of the stress test the patient had a left heart catheterization on 11/29/2015 which showed diffuse coronary disease but there was no hemodynamic significant blockages. Following the catheterization the patient was hemodynamically stable, afebrile and ready for discharge. At the time of the patient's discharge he denied continued chest pain, shortness of breath, nausea or vomiting. [facility]'ll [facility] his primary care provider and with cardiology.  2. Hypertension Patient has a history of hypertension for which he was taking a beta blocker and lisinopril. We will discontinue use of his beta blocker as this is contraindicated in a patient with known cocaine use secondary to unopposed alpha activation. Upon discharge the patient was normotensive on his new hypertensive medication regimen. He will be discharged on lisinopril 20 mg once daily and Cardizem 240 mg once daily. Additionally, we discussed with the patient the importance of exercise and eating a heart healthy diet for the management of his chest pain and hypertension.  3. Bipolar, Depression and Psychiatric Issues Patient has a long history of multiple psychiatric illness. [facility]. During his hospitalization we did not make adjustments to his psychotic medications. He will continue his home regimen  and we encouraged follow-up with his outpatient psychiatry provider.    Discharge Vitals:   BP 108/70   Pulse (!) 109   Temp 97.9 F (36.6 C) (Oral)   Resp 14   Ht 5\' 2"  (1.575 m)   Wt 232 lb 3.2 oz (105.3 kg) Comment: b scale  SpO2 98%   BMI 42.47 kg/m   Pertinent Labs, Studies, and Procedures:  1. Echocardiogram showing left ventricular ejection fraction 55-60% 2. CTA with no evidence of aortic dissection or acute pulmonary embolism 3. Nuclear stress test showing mild reversible ischemia 4. Left heart catheterization showing diffuse coronary disease without role for stenting  Discharge Instructions: Please follow up with your cardiologist. I have spoken to your cardiologist and they have stated they will call you and arrange an appointment for you to see them in the outpatient setting. Additionally, please follow-up with your healthcare providers at the [birth year][facility][birth year][facility]1994. Lastly, please continue your medications as prescribed. During this hospitalization we stopped the use of your beta blocker and have started you on a medication called Cardizem. Please take these medications as prescribed and follow up with your physicians in the outpatient setting. Discharge Instructions  Diet - low sodium heart healthy    Complete by:  As directed   Discharge instructions    Complete by:  As directed   Please follow up with you health care providers at the [facility]. Additionally, please follow up with your cardiologist. Your cardiologist will call you and schedule an appointment in the outpatient setting. Please continue taking her medications as prescribed.   Increase activity slowly    Complete by:  As directed      Signed: Thomasene Lot, MD 11/29/2015, 3:11 PM   Pager: [phone]

## 2015-11-29 NOTE — H&P (View-Only) (Signed)
SUBJECTIVE:  No complaints  OBJECTIVE:   Vitals:   Vitals:   11/27/15 1132 11/27/15 1300 11/27/15 2226 11/28/15 0538  BP: 133/90 139/75 130/80 130/73  Pulse:  94 90 96  Resp:  20  18  Temp:  97.4 F (36.3 C) 98.4 F (36.9 C) 97.7 F (36.5 C)  TempSrc:  Oral Oral Oral  SpO2:  100% 98% 99%  Weight:    233 lb 1.6 oz (105.7 kg)  Height:       I&O's:   Intake/Output Summary (Last 24 hours) at 11/28/15 1139 Last data filed at 11/28/15 0511  Gross per 24 hour  Intake              720 ml  Output             1101 ml  Net             -381 ml   TELEMETRY: Reviewed telemetry pt in NSR:     PHYSICAL EXAM General: Well developed, well nourished, in no acute distress Head: Eyes PERRLA, No xanthomas.   Normal cephalic and atramatic  Lungs:   Clear bilaterally to auscultation and percussion. Heart:   HRRR S1 S2 Pulses are 2+ & equal. Abdomen: Bowel sounds are positive, abdomen soft and non-tender without masses  Msk:  Back normal, normal gait. Normal strength and tone for age. Extremities:   No clubbing, cyanosis or edema.  DP +1 Neuro: Alert and oriented X 3. Psych:  Good affect, responds appropriately   LABS: Basic Metabolic Panel:  Recent Labs  29/52/84 0220  NA 139  K 4.0  CL 108  CO2 25  GLUCOSE 108*  BUN 11  CREATININE 0.76  CALCIUM 8.9   Liver Function Tests:  Recent Labs  11/26/15 0220  AST 43*  ALT 71*  ALKPHOS 49  BILITOT 0.3  PROT 6.1*  ALBUMIN 3.5   No results for input(s): LIPASE, AMYLASE in the last 72 hours. CBC: No results for input(s): WBC, NEUTROABS, HGB, HCT, MCV, PLT in the last 72 hours. Cardiac Enzymes:  Recent Labs  11/25/15 1528 11/25/15 2049  TROPONINI 0.06* 0.04*   BNP: Invalid input(s): POCBNP D-Dimer: No results for input(s): DDIMER in the last 72 hours. Hemoglobin A1C:  Recent Labs  11/25/15 1528  HGBA1C 5.8*   Fasting Lipid Panel:  Recent Labs  11/26/15 0220  CHOL 130  HDL 33*  LDLCALC 80  TRIG 84    CHOLHDL 3.9   Thyroid Function Tests: No results for input(s): TSH, T4TOTAL, T3FREE, THYROIDAB in the last 72 hours.  Invalid input(s): FREET3 Anemia Panel: No results for input(s): VITAMINB12, FOLATE, FERRITIN, TIBC, IRON, RETICCTPCT in the last 72 hours. Coag Panel:   No results found for: INR, PROTIME  RADIOLOGY: Nm Myocar Multi W/spect W/wall Motion / Ef  Result Date: 11/27/2015 CLINICAL DATA:  Chest pain, smoker EXAM: MYOCARDIAL IMAGING WITH SPECT (REST AND PHARMACOLOGIC-STRESS) GATED LEFT VENTRICULAR WALL MOTION STUDY LEFT VENTRICULAR EJECTION FRACTION TECHNIQUE: Standard myocardial SPECT imaging was performed after resting intravenous injection of 10 mCi Tc-16m tetrofosmin. Subsequently, intravenous infusion of Lexiscan was performed under the supervision of the Cardiology staff. At peak effect of the drug, 30 mCi Tc-51m tetrofosmin was injected intravenously and standard myocardial SPECT imaging was performed. Quantitative gated imaging was also performed to evaluate left ventricular wall motion, and estimate left ventricular ejection fraction. COMPARISON:  None. FINDINGS: Perfusion: Decreased activity of the apex and inferior wall on the stress views when compared to the  rest views. A component this may be secondary to inferior attenuation artifact however difficult to exclude mild inferior apical ischemia. Wall Motion: Normal left ventricular wall motion. No left ventricular dilation. Left Ventricular Ejection Fraction: 61 % End diastolic volume 103 ml End systolic volume 40 ml IMPRESSION: 1. Inferior apical attenuation artifact versus mild inducible ischemia with pharmacologic stress. 2. Normal left ventricular wall motion. 3. Left ventricular ejection fraction 61% 4. Non invasive risk stratification*: Low to intermediate *2012 Appropriate Use Criteria for Coronary Revascularization Focused Update: J Am Coll Cardiol. 2012;59(9):857-881.  http://content.[email or web address].aspx?[email or web address] Electronically Signed   By: Judie Petit.  Shick M.D.   On: 11/27/2015 13:51   Dg Chest Portable 1 View  Result Date: 11/25/2015 CLINICAL DATA:  55 year old male with mid lower chest pain and shortness of breath this morning. Dizziness and diaphoresis. Initial encounter. Smoker. EXAM: PORTABLE CHEST 1 VIEW COMPARISON:  09/25/2015 and earlier. FINDINGS: Portable AP supine view at 0856 hours. Normal cardiac size and mediastinal contours. Stable to mildly improved lung volumes. Allowing for portable technique the lungs are clear. No pneumothorax. No pleural effusion. IMPRESSION: No acute cardiopulmonary abnormality. Electronically Signed   By: Odessa Fleming M.D.   On: 11/25/2015 09:06   Ct Angio Chest/abd/pel For Dissection W And/or Wo Contrast  Result Date: 11/25/2015 CLINICAL DATA:  Chest and abdominal pain radiating toward back EXAM: CT ANGIOGRAPHY CHEST, ABDOMEN AND PELVIS TECHNIQUE: Initially, axial CT images were obtained through the chest without intravenous contrast material administration. Multidetector CT imaging through the chest, abdomen and pelvis was performed using the standard protocol during bolus administration of intravenous contrast. Multiplanar reconstructed images and MIPs were obtained and reviewed to evaluate the vascular anatomy. CONTRAST:  100 mL Isovue 370 nonionic COMPARISON:  Chest CT December 16, 2014; chest radiograph November 25, 2015 ; CT abdomen and pelvis Sep 06, 2012 FINDINGS: CTA CHEST FINDINGS There is no demonstrable thoracic aortic aneurysm or dissection. The visualized great vessels appear unremarkable. The left and right common carotid arteries arise as a common trunk, an anatomic variant. There are multiple foci of coronary artery calcification evident. Pericardium is not appreciably thickened. There is no demonstrable pulmonary embolus. Visualized thyroid appears within normal limits. There is no apparent adenopathy. There is  patchy atelectasis throughout the lungs bilaterally. There may be a slight degree of early interstitial edema in the lower lung zone regions. There is a bulla in the right base region. There are no blastic or lytic bone lesions. Review of the MIP images confirms the above findings. CTA ABDOMEN AND PELVIS FINDINGS There is no demonstrable abdominal aortic aneurysm or dissection. There is atherosclerotic calcification and mild plaque in the distal abdominal aorta without evidence approaching hemodynamically significant obstruction. There is a slight degree of calcification in the proximal right common iliac artery. There is modest calcification in the right hypogastric artery. There is no appreciable narrowing of the major pelvic mesenteric vessels. There is no aneurysm or dissection involving the major pelvic arterial vessels. The celiac and superior mesenteric arteries are widely patent. The inferior mesenteric artery is patent as well. Renal arteries bilaterally are patent. Note that there is a small accessory renal artery on the left which is widely patent. A single renal artery is noted on the right. No liver lesions are appreciable. Gallbladder wall is not appreciably thickened. There is no biliary duct dilatation. Spleen, pancreas, and adrenals appear normal. Kidneys bilaterally show no evidence of mass or hydronephrosis on either side. There is no demonstrable renal or ureteral calculus on either  side. The urinary bladder is midline with wall thickness within normal limits. No pelvic mass or pelvic fluid collection is seen. There are a few prostatic calculi. Prostate and seminal vesicles appear normal. Appendix appears normal. There is no ascites, adenopathy, or abscess in the abdomen or pelvis. No bowel obstruction or bowel wall thickening. No free air or portal venous air. There is degenerative change in the lumbar spine with vacuum phenomenon at L4-5. There are no blastic or lytic bone lesions. Review of  the MIP images confirms the above findings. IMPRESSION: CT angiogram chest: . There is no demonstrable thoracic aortic aneurysm or dissection. No pulmonary embolus. There are foci of coronary artery calcification. There are areas of patchy atelectasis. There may be a slight degree of interstitial edema in the bases. There is no airspace consolidation. No adenopathy. CT angiogram abdomen and pelvis: No abdominal aortic or major arterial vessel aneurysm or dissection in the abdomen or pelvis. Mild atherosclerotic calcification and plaque in the distal aorta without hemodynamically significant obstruction. A few scattered foci of calcification in pelvic arterial vessels without appreciable vessel narrowing. Major mesenteric vessels appear widely patent. Study otherwise essentially unremarkable. No inflammatory focus or abnormal fluid collection. Appendix appears normal. No bowel obstruction or abscess. Electronically Signed   By: Bretta Bang III M.D.   On: 11/25/2015 10:25    ASSESSMENT/PLAN: 1. Palpitations- Apparently had SVT reported by EMSalthough I cannot find any strips to document this. This terminated after Adenosine 6mg  IV and he has not had any further arrhythmias on tele. He will need an event monitor at discharge to assess for suppression of SVT and evaluate for any otherarrhythmias. We will arrange this. Would avoid BB use given his history of cocaine. Starteded cardizem CD 180mg  daily for suppression of SVT and stopped lopressor.  HR still intermittently elevated so will increase Cardizem CD to 240mg  daily.  2. Chest painthat sounds atypical for coronary ischemia and sound more related to his palpitations. He did get diaphoretic but he was out in the heat and that could have been related to SVT. Trop is minimally elevated, and again, could be related demand ischemia in setting of a tachyarrhythmia. Apparently initial EKG by EMS showed ST depression but that EKG is unavailable at  this time. That is resolved by EKG in ER. UDS is negative and no PE or aortic dissection on Chest CT. He does have evidence of foci of coronary artery calcification on chest CT. There is no family history of CAD but he is a smoker and therefore at risk for CAD. 2D echo showed normal LVF with EF 55-60% and G1DD and mild to moderate MR. Had nuclear stress test showing inferoapical defect that is mildly reversible.  Given his history of coronary artery calcifications on CT and significant chest pain during SVT will proceed with heart cath.  He has had some CP at other times without palptiations. He has been a smoker as well so risk of CAD high. Plan cath tomorrow.  Cardiac catheterization was discussed with the patient fully. The patient understands that risks include but are not limited to stroke (1 in 1000), death (1 in 1000), kidney failure [usually temporary] (1 in 500), bleeding (1 in 200), allergic reaction [possibly serious] (1 in 200).  The patient understands and is willing to proceed.    3. HTN- BP controlled. Continue ACE I. Would not use BB therapy given his history of cocaine use.Continue cardizem CD   4. Mild to moderate MR - repeat  echo in 1 year to assess for stability.      Armanda Magic, MD  11/28/2015  11:39 AM

## 2015-11-29 NOTE — Progress Notes (Signed)
   Subjective: Patient complained of bilateral lower quadrant abdominal pain yesterday evening. He was given a GI cocktail and Toradol which improved his pain. This morning he stated he was having some mild chest pain. He will have cardiac catheterization this morning. Has no additional acute complaints or concerns.  Objective:  Vital signs in last 24 hours: Vitals:   11/28/15 0538 11/28/15 1300 11/28/15 2043 11/29/15 0624  BP: 130/73 125/82 125/85 108/65  Pulse: 96 78 89 70  Resp: 18 20 18 17   Temp: 97.7 F (36.5 C) 98.4 F (36.9 C) 98.4 F (36.9 C) 97.9 F (36.6 C)  TempSrc: Oral Oral Oral Oral  SpO2: 99% 100% 98% 97%  Weight: 233 lb 1.6 oz (105.7 kg)   232 lb 3.2 oz (105.3 kg)  Height:       Physical Exam  Constitutional: He is oriented to person, place, and time. He appears well-developed and well-nourished.  HENT:  Head: Normocephalic and atraumatic.  Cardiovascular: Normal rate and regular rhythm.  Exam reveals no gallop and no friction rub.   No murmur heard. Respiratory: Effort normal and breath sounds normal. No respiratory distress. He has no wheezes. He has no rales.  Neurological: He is alert and oriented to person, place, and time.     Assessment/Plan: [name] is a 55 year old male with history of bipolar, depression, chronic kidney disease, hepatitis C infection who presented with chest pain, palpitations nausea and vomiting found to have supraventricular tachycardia currently status post adenosine cardioversion.  1. Chest Pain and Palpitations -- Telemetry -- We will need an event monitor arranged by cardiology -- Patient with nuclear test showing reversible inferior apical defects -- We'll have cardiac catheterization this morning  2. Hypertension Patient currently normotensive with the most recent blood pressure of 123/76. -- Continue lisinopril 20 mg once daily -- Continue Cardizem 240 mg once daily -- Avoid beta blockers in a patient with a history  of cocaine abuse secondary to unopposed alpha activation  3. Bipolar, depression and other psychiatric illness -- Continue home psychiatric medications -- Seroquel 100 mg daily, divalproex 500 mg 24-hour tablet, sertraline 100 mg once daily, trazodone 150 mg at bedtime when necessary for sleep  4.DVT/PE prophylaxis -- Lovonox 40 mg subcutaneous injection once daily  Dispo: Anticipated discharge today.   53, MD 11/29/2015, 8:19 AM Pager: [phone]

## 2015-11-29 NOTE — Progress Notes (Signed)
Discharge instructions given Verbalized understanding. Went home ambulatory .Walked with suprvision by NT to lobby

## 2015-11-29 NOTE — Plan of Care (Signed)
Problem: Education: Goal: Knowledge of [facility] Education information/materials will improve Outcome: Completed/Met Date Met: 11/29/15 pna vaccine refused by pt prefer to go PCP

## 2015-11-29 NOTE — Clinical Social Work Note (Signed)
Clinical Social Work Assessment  Patient Details  Name: [name] MRN: [id number] Date of Birth: 04-22-1960  Date of referral:  11/29/15               Reason for consult:  Housing Concerns/Homelessness                Permission sought to share information with:    Permission granted to share information::  No  Name::        Agency::     Relationship::     Contact Information:     Housing/Transportation Living arrangements for the past 2 months:  [place] of Information:  Patient, Medical Team Patient Interpreter Needed:  None Criminal Activity/Legal Involvement Pertinent to Current Situation/Hospitalization:  No - Comment as needed Significant Relationships:  None Lives with:  Self Do you feel safe going back to the place where you live?  Yes Need for family participation in patient care:  Yes (Comment)  Care giving concerns:  Patient currently lives in a shelter. Also in need of bus pass.   Social Worker assessment / plan:  CSW met with patient. No supports at bedside. CSW introduced role and inquired about needs prior to discharge, likely today. Patient confirmed that he was in need of a bus pass and stated that he was open to receiving any resources CSW had. Patient has been living at the [facility] but plans to move to an apartment in [place] at some point in the near future. Resources provided for other homeless shelters, food pantries, and free meals. Bus pass given to RN. No further concerns. CSW will sign off as social work intervention is no longer needed.  Employment status:  Disabled (Comment on whether or not currently receiving Disability) Insurance information:  Medicaid [place] PT Recommendations:  Not assessed at this time Information / Referral to community resources:  Shelter, Other (Comment Required) (Food pantries and free meals, bus pass)  Patient/Family's Response to care:  Patient agreeable to receiving resources. Patient has no  supports. Patient appreciated social work intervention.  Patient/Family's Understanding of and Emotional Response to Diagnosis, Current Treatment, and Prognosis:  Patient was open to receiving any resources CSW had. Patient appears happy with hospital care.  Emotional Assessment Appearance:  Appears stated age Attitude/Demeanor/Rapport:  Other (Pleasant) Affect (typically observed):  Accepting, Appropriate, Calm, Pleasant Orientation:  Oriented to Self, Oriented to Place, Oriented to  Time, Oriented to Situation Alcohol / Substance use:  Alcohol Use, Illicit Drugs Psych involvement (Current and /or in the community):  No (Comment)  Discharge Needs  Concerns to be addressed:  Homelessness, Substance Abuse Concerns, Mental Health Concerns Readmission within the last 30 days:  No Current discharge risk:  Homeless, Substance Abuse, Psychiatric Illness, Lack of support system Barriers to Discharge:  Active Substance Use, Homeless with medical needs   Candie Chroman, LCSW 11/29/2015, 1:00 PM

## 2015-11-30 ENCOUNTER — Encounter (HOSPITAL_COMMUNITY): Payer: Self-pay | Admitting: Cardiology

## 2015-12-06 DIAGNOSIS — F319 Bipolar disorder, unspecified: Secondary | ICD-10-CM

## 2015-12-06 NOTE — Congregational Nurse Program (Signed)
Congregational Nurse Program Note  Date of Encounter: 12/06/2015  Past Medical History: Past Medical History:  Diagnosis Date  . Alcohol abuse   . Anxiety   . Arthritis    "all over" (11/25/2015)  . Bipolar 1 disorder (HCC)   . Bipolar affective disorder (HCC)   . Chronic back pain   . Chronic kidney disease   . Cocaine abuse    smokes crack-cocaine  . Depression   . GERD (gastroesophageal reflux disease)   . Gout   . Headache    "q couple days; stress, tension, anger" (11/25/2015)  . Hemorrhoids   . Hepatitis C    "not yet treated" (11/25/2015)  . Hypertension   . Insomnia   . Myocardial infarction [facility]) 11/2015   "supposedly" (11/25/2015)  . OSA on CPAP    "had mask; it was stolen" (11/25/2015)  . PTSD (post-traumatic stress disorder)   . Tobacco abuse   . Vitamin D deficiency     Encounter Details:     CNP Questionnaire - 12/06/15 1450      Patient Demographics   Is this a new or existing patient? New   Patient is considered a/an Not Applicable   Race African-American/Black     Patient Assistance   Location of Patient Assistance [facility]   Patient's financial/insurance status Low Income;Medicare;Medicaid   Uninsured Patient No   Patient referred to apply for the following financial assistance Not Applicable   Food insecurities addressed Provided food supplies   Transportation assistance Yes   Type of Assistance Bus Pass Given   Assistance securing medications No   Educational health offerings Chronic disease;Behavioral health     Encounter Details   Primary purpose of visit Chronic Illness/Condition Visit;Navigating the Healthcare System;Education/Health Concerns   Was an Emergency Department visit averted? Not Applicable   Does patient have a medical provider? Yes   Patient referred to Urgent Care   Was a mental health screening completed? (GAINS tool) No   Does patient have dental issues? No   Does patient have vision issues? No   Does your patient have an  abnormal blood pressure today? No   Since previous encounter, have you referred patient for abnormal blood pressure that resulted in a new diagnosis or medication change? No   Does your patient have an abnormal blood glucose today? No   Since previous encounter, have you referred patient for abnormal blood glucose that resulted in a new diagnosis or medication change? No   Was there a life-saving intervention made? No      States is bipolar and has chronic pain.  C/O feeling "very agitated" and describes pain as 7/10.  Is taking a muscle relaxer for his chronic back pain but client states it is not helping.  Has an appointment with [facility] on the 6th of September but needs something for pain now.  Since he has no local provider, directed client to urgent care.  Bus passes provided

## 2015-12-13 ENCOUNTER — Telehealth (HOSPITAL_BASED_OUTPATIENT_CLINIC_OR_DEPARTMENT_OTHER): Payer: Self-pay | Admitting: *Deleted

## 2015-12-13 NOTE — Telephone Encounter (Signed)
Post ED Visit - Positive Culture Follow-up: Unsuccessful Patient Follow-up  Culture assessed and recommendations reviewed by: []  , Pharm.D. []  Enzo Bi, Pharm.D., BCPS []  , Pharm.D. []  Celedonio Miyamoto, Pharm.D., BCPS []  [place], Garvin Fila.D., BCPS, AAHIVP []  , Pharm.D., BCPS, AAHIVP []  Georgina Pillion, Pharm.D. []  , [place].D.  Positive urine culture  []  Patient discharged without antimicrobial prescription and treatment is now indicated [x]  Organism is resistant to prescribed ED discharge antimicrobial []  Patient with positive blood cultures   Unable to contact patient after 3 attempts, letter sent to address on file with no response.  No further treatment received.  [address] [facility] 12/13/2015, 11:23 AM

## 2016-01-15 DIAGNOSIS — Z23 Encounter for immunization: Secondary | ICD-10-CM | POA: Diagnosis not present

## 2016-01-15 DIAGNOSIS — R51 Headache: Secondary | ICD-10-CM | POA: Diagnosis not present

## 2016-01-15 DIAGNOSIS — R079 Chest pain, unspecified: Secondary | ICD-10-CM | POA: Diagnosis not present

## 2016-01-15 DIAGNOSIS — G8929 Other chronic pain: Secondary | ICD-10-CM | POA: Diagnosis not present

## 2016-01-15 DIAGNOSIS — Z79899 Other long term (current) drug therapy: Secondary | ICD-10-CM | POA: Diagnosis not present

## 2016-01-15 DIAGNOSIS — Z9889 Other specified postprocedural states: Secondary | ICD-10-CM | POA: Diagnosis not present

## 2016-01-15 DIAGNOSIS — I10 Essential (primary) hypertension: Secondary | ICD-10-CM | POA: Diagnosis not present

## 2016-01-16 DIAGNOSIS — R0789 Other chest pain: Secondary | ICD-10-CM | POA: Diagnosis not present

## 2016-01-16 DIAGNOSIS — I10 Essential (primary) hypertension: Secondary | ICD-10-CM | POA: Diagnosis not present

## 2016-01-16 DIAGNOSIS — R4589 Other symptoms and signs involving emotional state: Secondary | ICD-10-CM | POA: Insufficient documentation

## 2016-01-17 DIAGNOSIS — I10 Essential (primary) hypertension: Secondary | ICD-10-CM | POA: Diagnosis not present

## 2016-01-22 DIAGNOSIS — I10 Essential (primary) hypertension: Secondary | ICD-10-CM | POA: Diagnosis not present

## 2016-02-23 DIAGNOSIS — F32A Depression, unspecified: Secondary | ICD-10-CM | POA: Insufficient documentation

## 2016-02-23 DIAGNOSIS — F1410 Cocaine abuse, uncomplicated: Secondary | ICD-10-CM | POA: Diagnosis present

## 2016-02-23 DIAGNOSIS — F329 Major depressive disorder, single episode, unspecified: Secondary | ICD-10-CM | POA: Insufficient documentation

## 2016-02-23 DIAGNOSIS — R45851 Suicidal ideations: Secondary | ICD-10-CM | POA: Insufficient documentation

## 2016-02-23 DIAGNOSIS — R45850 Homicidal ideations: Secondary | ICD-10-CM | POA: Insufficient documentation

## 2016-03-18 ENCOUNTER — Encounter (HOSPITAL_COMMUNITY): Payer: Self-pay | Admitting: Emergency Medicine

## 2016-03-18 ENCOUNTER — Emergency Department (HOSPITAL_COMMUNITY)
Admission: EM | Admit: 2016-03-18 | Discharge: 2016-03-20 | Disposition: A | Discharge status: Home / Self Care | Payer: Medicare Other | Attending: Emergency Medicine | Admitting: Emergency Medicine

## 2016-03-18 ENCOUNTER — Emergency Department (HOSPITAL_COMMUNITY): Payer: Medicare Other

## 2016-03-18 DIAGNOSIS — F1414 Cocaine abuse with cocaine-induced mood disorder: Secondary | ICD-10-CM | POA: Insufficient documentation

## 2016-03-18 DIAGNOSIS — I129 Hypertensive chronic kidney disease with stage 1 through stage 4 chronic kidney disease, or unspecified chronic kidney disease: Secondary | ICD-10-CM | POA: Insufficient documentation

## 2016-03-18 DIAGNOSIS — F17210 Nicotine dependence, cigarettes, uncomplicated: Secondary | ICD-10-CM | POA: Diagnosis not present

## 2016-03-18 DIAGNOSIS — F17200 Nicotine dependence, unspecified, uncomplicated: Secondary | ICD-10-CM | POA: Insufficient documentation

## 2016-03-18 DIAGNOSIS — N189 Chronic kidney disease, unspecified: Secondary | ICD-10-CM | POA: Diagnosis not present

## 2016-03-18 DIAGNOSIS — R45851 Suicidal ideations: Secondary | ICD-10-CM | POA: Diagnosis not present

## 2016-03-18 DIAGNOSIS — Z8249 Family history of ischemic heart disease and other diseases of the circulatory system: Secondary | ICD-10-CM | POA: Diagnosis not present

## 2016-03-18 DIAGNOSIS — F3130 Bipolar disorder, current episode depressed, mild or moderate severity, unspecified: Secondary | ICD-10-CM | POA: Diagnosis not present

## 2016-03-18 DIAGNOSIS — Z91011 Allergy to milk products: Secondary | ICD-10-CM | POA: Diagnosis not present

## 2016-03-18 DIAGNOSIS — I252 Old myocardial infarction: Secondary | ICD-10-CM | POA: Insufficient documentation

## 2016-03-18 DIAGNOSIS — Z79899 Other long term (current) drug therapy: Secondary | ICD-10-CM | POA: Diagnosis not present

## 2016-03-18 DIAGNOSIS — Z9889 Other specified postprocedural states: Secondary | ICD-10-CM | POA: Diagnosis not present

## 2016-03-18 DIAGNOSIS — R079 Chest pain, unspecified: Secondary | ICD-10-CM

## 2016-03-18 DIAGNOSIS — Z833 Family history of diabetes mellitus: Secondary | ICD-10-CM | POA: Diagnosis not present

## 2016-03-18 DIAGNOSIS — Z8261 Family history of arthritis: Secondary | ICD-10-CM | POA: Diagnosis not present

## 2016-03-18 LAB — COMPREHENSIVE METABOLIC PANEL
ALT: 390 U/L — ABNORMAL HIGH (ref 17–63)
AST: 219 U/L — ABNORMAL HIGH (ref 15–41)
Albumin: 4.1 g/dL (ref 3.5–5.0)
Alkaline Phosphatase: 45 U/L (ref 38–126)
Anion gap: 9 (ref 5–15)
BUN: 8 mg/dL (ref 6–20)
CO2: 23 mmol/L (ref 22–32)
Calcium: 9 mg/dL (ref 8.9–10.3)
Chloride: 105 mmol/L (ref 101–111)
Creatinine, Ser: 0.84 mg/dL (ref 0.61–1.24)
GFR calc Af Amer: >60 mL/min (ref >60)
GFR calc non Af Amer: >60 mL/min (ref >60)
Glucose, Bld: 93 mg/dL (ref 65–99)
Potassium: 2.9 mmol/L — ABNORMAL LOW (ref 3.5–5.1)
Sodium: 137 mmol/L (ref 135–145)
Total Bilirubin: 0.6 mg/dL (ref 0.3–1.2)
Total Protein: 7 g/dL (ref 6.5–8.1)

## 2016-03-18 LAB — ETHANOL: Alcohol, Ethyl (B): <5 mg/dL (ref <5)

## 2016-03-18 LAB — CBC WITH DIFFERENTIAL/PLATELET
Basophils Absolute: 0 10*3/uL (ref 0.0–0.1)
Basophils Relative: 0 %
Eosinophils Absolute: 0.1 10*3/uL (ref 0.0–0.7)
Eosinophils Relative: 1 %
HCT: 41.7 % (ref 39.0–52.0)
Hemoglobin: 14.4 g/dL (ref 13.0–17.0)
Lymphocytes Relative: 25 %
Lymphs Abs: 1.7 10*3/uL (ref 0.7–4.0)
MCH: 28.2 pg (ref 26.0–34.0)
MCHC: 34.5 g/dL (ref 30.0–36.0)
MCV: 81.6 fL (ref 78.0–100.0)
Monocytes Absolute: 0.6 10*3/uL (ref 0.1–1.0)
Monocytes Relative: 9 %
Neutro Abs: 4.4 10*3/uL (ref 1.7–7.7)
Neutrophils Relative %: 65 %
Platelets: 269 10*3/uL (ref 150–400)
RBC: 5.11 MIL/uL (ref 4.22–5.81)
RDW: 14.8 % (ref 11.5–15.5)
WBC: 6.9 10*3/uL (ref 4.0–10.5)

## 2016-03-18 LAB — ACETAMINOPHEN LEVEL: Acetaminophen (Tylenol), Serum: <10 ug/mL — ABNORMAL LOW (ref 10–30)

## 2016-03-18 LAB — RAPID URINE DRUG SCREEN, HOSP PERFORMED
Amphetamines: NOT DETECTED
Barbiturates: NOT DETECTED
Benzodiazepines: NOT DETECTED
Cocaine: POSITIVE — AB
Opiates: NOT DETECTED
Tetrahydrocannabinol: POSITIVE — AB

## 2016-03-18 LAB — SALICYLATE LEVEL: Salicylate Lvl: <7 mg/dL (ref 2.8–30.0)

## 2016-03-18 LAB — TROPONIN I: Troponin I: <0.03 ng/mL (ref <0.03)

## 2016-03-18 MED ORDER — IBUPROFEN 200 MG PO TABS: 600.0000 mg | ORAL_TABLET | Freq: Three times a day (TID) | ORAL | Status: DC | PRN

## 2016-03-18 MED ORDER — DILTIAZEM HCL ER COATED BEADS 240 MG PO CP24
240.0000 mg | ORAL_CAPSULE | Freq: Every day | ORAL | Status: DC
  Administered 2016-03-18 – 2016-03-20 (×3): 240 mg via ORAL
  Filled 2016-03-18 (×3): qty 1

## 2016-03-18 MED ORDER — TAMSULOSIN HCL 0.4 MG PO CAPS
0.4000 mg | ORAL_CAPSULE | Freq: Every day | ORAL | Status: DC
  Administered 2016-03-18 – 2016-03-20 (×3): 0.4 mg via ORAL
  Filled 2016-03-18 (×3): qty 1

## 2016-03-18 MED ORDER — GABAPENTIN 300 MG PO CAPS
300.0000 mg | ORAL_CAPSULE | Freq: Three times a day (TID) | ORAL | Status: DC
  Administered 2016-03-18 – 2016-03-20 (×6): 300 mg via ORAL
  Filled 2016-03-18 (×6): qty 1

## 2016-03-18 MED ORDER — ACETAMINOPHEN 325 MG PO TABS: 650.0000 mg | ORAL_TABLET | ORAL | Status: DC | PRN

## 2016-03-18 MED ORDER — TRAZODONE HCL 100 MG PO TABS
100.0000 mg | ORAL_TABLET | Freq: Every evening | ORAL | Status: DC | PRN
  Administered 2016-03-18: 100 mg via ORAL
  Filled 2016-03-18: qty 1

## 2016-03-18 MED ORDER — PANTOPRAZOLE SODIUM 40 MG PO TBEC
40.0000 mg | DELAYED_RELEASE_TABLET | Freq: Every day | ORAL | Status: DC
  Administered 2016-03-18 – 2016-03-20 (×3): 40 mg via ORAL
  Filled 2016-03-18 (×3): qty 1

## 2016-03-18 MED ORDER — POTASSIUM CHLORIDE CRYS ER 20 MEQ PO TBCR
40.0000 meq | EXTENDED_RELEASE_TABLET | Freq: Once | ORAL | Status: AC
  Administered 2016-03-18: 40 meq via ORAL
  Filled 2016-03-18: qty 2

## 2016-03-18 MED ORDER — ALUM & MAG HYDROXIDE-SIMETH 200-200-20 MG/5ML PO SUSP: 30.0000 mL | ORAL | Status: DC | PRN

## 2016-03-18 MED ORDER — DILTIAZEM HCL ER COATED BEADS 240 MG PO CP24: 240.0000 mg | ORAL_CAPSULE | Freq: Every day | ORAL | Status: DC

## 2016-03-18 MED ORDER — HYDROXYZINE HCL 25 MG PO TABS
25.0000 mg | ORAL_TABLET | Freq: Three times a day (TID) | ORAL | Status: DC | PRN
  Filled 2016-03-18: qty 1

## 2016-03-18 MED ORDER — ONDANSETRON HCL 4 MG PO TABS: 4.0000 mg | ORAL_TABLET | Freq: Three times a day (TID) | ORAL | Status: DC | PRN

## 2016-03-18 MED ORDER — LORAZEPAM 1 MG PO TABS: 1.0000 mg | ORAL_TABLET | Freq: Three times a day (TID) | ORAL | Status: DC | PRN

## 2016-03-18 NOTE — ED Provider Notes (Signed)
[facility]-EMERGENCY DEPT Provider Note   CSN: [id number] Arrival date & time: 03/18/16  1109     History   Chief Complaint Chief Complaint  Patient presents with  . Suicidal    HPI [name] is a 55 y.o. male.  55 year old male presents with suicidal ideations and suicide attempt by smoking crack cocaine. History of suicide attempt in the past. Patient states he ate has a history of bipolar disorder as well as PTSD is not been on his medications in the past 2 months. States he has been responding to internal stimuli tell him to harm himself. Denies any homicidal ideations. No current ingestions of aspirin or Tylenol. He is now experiencing diffused muscle skeletal chest pain is worse with movement. Denies any associated dyspnea or diaphoresis. Last use of crack cocaine was about 2:00 this morning. His chest pain is been constant and better with remaining still. Patient is currently homeless. States he has been trying to get into rehabilitation facilities. Denies any current use of alcohol products. Called EMS and was transported here.      Past Medical History:  Diagnosis Date  . Alcohol abuse   . Anxiety   . Arthritis    "all over" (11/25/2015)  . Bipolar 1 disorder (HCC)   . Bipolar affective disorder (HCC)   . Chronic back pain   . Chronic kidney disease   . Cocaine abuse    smokes crack-cocaine  . Depression   . GERD (gastroesophageal reflux disease)   . Gout   . Headache    "q couple days; stress, tension, anger" (11/25/2015)  . Hemorrhoids   . Hepatitis C    "not yet treated" (11/25/2015)  . Hypertension   . Insomnia   . Myocardial infarction 11/2015   "supposedly" (11/25/2015)  . OSA on CPAP    "had mask; it was stolen" (11/25/2015)  . PTSD (post-traumatic stress disorder)   . Tobacco abuse   . Vitamin D deficiency     Patient Active Problem List   Diagnosis Date Noted  . Abnormal nuclear stress test   . Palpitations 11/26/2015  . Troponin level elevated  11/26/2015  . Homeless 11/26/2015  . Essential hypertension 11/26/2015  . SVT (supraventricular tachycardia) (HCC)   . Pain in the chest 11/25/2015  . GERD (gastroesophageal reflux disease)   . Bipolar 1 disorder (HCC)   . Back pain, chronic   . Chronic kidney disease   . Affective psychosis, bipolar (HCC)   . Alcohol use disorder, severe, dependence (HCC)   . Cocaine use disorder, severe, dependence (HCC)   . Generalized abdominal pain   . Bipolar I disorder, most recent episode depressed (HCC)   . Bipolar disorder, current episode depressed, severe, without psychotic features (HCC)   . Bipolar affective disorder, depressed, severe (HCC) 03/26/2014  . Right hip pain   . Medically noncompliant 03/23/2014  . Dysuria 03/22/2013  . Headache 03/22/2013  . Polysubstance dependence (HCC) 03/21/2012  . Anxiety   . Bipolar affective disorder (HCC)   . Polysubstance abuse   . Gout   . Vitamin D deficiency   . Hepatitis C     Past Surgical History:  Procedure Laterality Date  . CARDIAC CATHETERIZATION N/A 11/29/2015   Procedure: Left Heart Cath and Coronary Angiography;  Surgeon: Laurey Morale, MD;  Location: [facility] INVASIVE CV LAB;  Service: Cardiovascular;  Laterality: N/A;  . FRACTURE SURGERY    . PATELLA FRACTURE SURGERY Left ~ 1995  . TONSILLECTOMY  Home Medications    Prior to Admission medications   Medication Sig Start Date End Date Taking? Authorizing Provider  amantadine (SYMMETREL) 100 MG capsule Take 100 mg by mouth daily.  08/23/15 08/22/16  Historical Provider, MD  cetirizine (ZYRTEC) 10 MG tablet Take 10 mg by mouth daily.    Historical Provider, MD  diclofenac (VOLTAREN) 50 MG EC tablet Take 50 mg by mouth 3 (three) times daily.    Historical Provider, MD  diltiazem (CARDIZEM CD) 240 MG 24 hr capsule Take 1 capsule (240 mg total) by mouth daily. 11/29/15   Thomasene Lot, MD  divalproex (DEPAKOTE ER) 500 MG 24 hr tablet Take 500 mg by mouth daily.    Historical  Provider, MD  gabapentin (NEURONTIN) 300 MG capsule Take 1 capsule (300 mg total) by mouth 3 (three) times daily. 02/01/15   Beau Fanny, FNP  hydrOXYzine (ATARAX/VISTARIL) 25 MG tablet Take 1 tablet (25 mg total) by mouth 3 (three) times daily as needed for anxiety. 02/01/15   Beau Fanny, FNP  lamoTRIgine (LAMICTAL) 25 MG tablet Take 25 mg by mouth daily.    Historical Provider, MD  lisinopril (PRINIVIL,ZESTRIL) 20 MG tablet Take 20 mg by mouth daily.    Historical Provider, MD  pantoprazole (PROTONIX) 40 MG tablet Take 1 tablet (40 mg total) by mouth daily. 02/01/15   Beau Fanny, FNP  QUEtiapine (SEROQUEL) 100 MG tablet Take 100 mg by mouth at bedtime.    Historical Provider, MD  sertraline (ZOLOFT) 100 MG tablet Take 100 mg by mouth daily. 08/23/15   Historical Provider, MD  tamsulosin (FLOMAX) 0.4 MG CAPS capsule Take 1 capsule (0.4 mg total) by mouth daily. 02/01/15   Beau Fanny, FNP  traZODone (DESYREL) 100 MG tablet Take 1 tablet (100 mg total) by mouth at bedtime as needed for sleep. Patient taking differently: Take 150 mg by mouth at bedtime.  02/01/15   Beau Fanny, FNP  zolpidem (AMBIEN) 10 MG tablet Take 10 mg by mouth at bedtime.    Historical Provider, MD    Family History Family History  Problem Relation Age of Onset  . Hypertension Brother   . Diabetes Brother   . Hypertension Mother   . Diabetes Mother   . Arthritis Mother   . Other Sister     bone disease    Social History Social History  Substance Use Topics  . Smoking status: Current Every Day Smoker    Packs/day: 0.12    Years: 46.00  . Smokeless tobacco: Never Used  . Alcohol use Yes     Comment: 11/25/2015 "quit alcohol 6 months ago; used to drink from morning til night"     Allergies   Patient has no known allergies.   Review of Systems Review of Systems  All other systems reviewed and are negative.    Physical Exam Updated Vital Signs There were no vitals taken for this  visit.  Physical Exam  Constitutional: He is oriented to person, place, and time. He appears well-developed and well-nourished.  Non-toxic appearance. No distress.  HENT:  Head: Normocephalic and atraumatic.  Eyes: Conjunctivae, EOM and lids are normal. Pupils are equal, round, and reactive to light.  Neck: Normal range of motion. Neck supple. No tracheal deviation present. No thyroid mass present.  Cardiovascular: Normal rate, regular rhythm and normal heart sounds.  Exam reveals no gallop.   No murmur heard. Pulmonary/Chest: Effort normal and breath sounds normal. No stridor. No respiratory distress. He  has no decreased breath sounds. He has no wheezes. He has no rhonchi. He has no rales. He exhibits tenderness. He exhibits no crepitus, no deformity and no swelling.    Abdominal: Soft. Normal appearance and bowel sounds are normal. He exhibits no distension. There is no tenderness. There is no rebound and no CVA tenderness.  Musculoskeletal: Normal range of motion. He exhibits no edema or tenderness.  Neurological: He is alert and oriented to person, place, and time. He has normal strength. No cranial nerve deficit or sensory deficit. GCS eye subscore is 4. GCS verbal subscore is 5. GCS motor subscore is 6.  Skin: Skin is warm and dry. No abrasion and no rash noted.  Psychiatric: His affect is blunt. His speech is delayed. He is withdrawn. He is not actively hallucinating. Thought content is not delusional. Cognition and memory are normal. He expresses impulsivity. He expresses suicidal ideation. He expresses suicidal plans. He expresses no homicidal plans.  Nursing note and vitals reviewed.    ED Treatments / Results  Labs (all labs ordered are listed, but only abnormal results are displayed) Labs Reviewed  TROPONIN I  CBC WITH DIFFERENTIAL/PLATELET  COMPREHENSIVE METABOLIC PANEL  ETHANOL  RAPID URINE DRUG SCREEN, HOSP PERFORMED  SALICYLATE LEVEL  ACETAMINOPHEN LEVEL    EKG   EKG Interpretation  Date/Time:  Saturday March 18 2016 12:35:35 EST Ventricular Rate:  65 PR Interval:  222 QRS Duration: 82 QT Interval:  452 QTC Calculation: 470 R Axis:   29 Text Interpretation:  Sinus rhythm with 1st degree A-V block with Premature supraventricular complexes Otherwise normal ECG Confirmed by Raidyn Wassink  MD, Briceyda Abdullah ([id number]) on 03/18/2016 12:55:57 PM       Radiology No results found.  Procedures Procedures (including critical care time)  Medications Ordered in ED Medications - No data to display   Initial Impression / Assessment and Plan / ED Course  I have reviewed the triage vital signs and the nursing notes.  Pertinent labs & imaging results that were available during my care of the patient were reviewed by me and considered in my medical decision making (see chart for details).  Clinical Course     Patient's chest discomfort likely chest wall pain. Troponin negative. He has had constant pain for over 6 hours. Medications not restarted because he has been off them for several months. He is now medically cleared for psychiatric disposition  Final Clinical Impressions(s) / ED Diagnoses   Final diagnoses:  Chest pain    New Prescriptions New Prescriptions   No medications on file     Lorre Nick, MD 03/18/16 1426

## 2016-03-18 NOTE — BH Assessment (Signed)
[facility] Assessment Progress Note   Case was staffed with Lord DNP who recommended patient be re-evaluated in the a.m.    

## 2016-03-18 NOTE — ED Triage Notes (Signed)
Patient called EMS and reports he wants to kill himself by smoking crack cocaine.  Patient reports he did this to try and get help with his addiction.  Patient was brought in from an extended stay hotel with no family present.  Patient is alert and oriented and has a history of bipolar.  Patient has not been taking his medications.

## 2016-03-18 NOTE — ED Notes (Signed)
Bed: WA27 Expected date: 03/18/16 Expected time: 11:01 AM Means of arrival: Ambulance Comments: SI smoked Crack this morning at 0300 with intent to harm self

## 2016-03-18 NOTE — ED Notes (Signed)
Pt arrived to unit

## 2016-03-18 NOTE — BH Assessment (Addendum)
Assessment Note  [name] is an 55 y.o. male that presents this date with thoughts of self harm with a plan to overdose on cocaine or cut his wrist. Patient is oriented to time/place and denies any H/I or AVH. Patient reported that he has "had enough of living like this" and wants to end his life. Patient is requesting long term treatment to assist with SA issues. Patient has multiple admission to [facility] in 2017 for similar symptoms and fails to follow up with after care due to patient stating, "it doesn't work." Patient reports ongoing depression to include symptoms of feeling worthless and loss of family members. Patient denies ever being on any MH medications and is very vague in reference to symptoms and treatment history. Patient is agitated during assessment reporting withdrawals from "using a lot of crack." Patient states "he cannot control his emotions" and has "the shakes." Patient reports daily cocaine use stating he uses 1 gram a day with last reported use on 03/17/16 when patient states he used "a whole lot" trying to "make his heart explode." Patient reports frequent ETOH use but is vague in reference to amounts. Admission note stated: "Patient   presents with suicidal ideations and suicide attempt by smoking crack cocaine. History of suicide attempt in the past. Patient states he ate has a history of bipolar disorder as well as PTSD is not been on his medications in the past 2 months. States he has been responding to internal stimuli tell him to harm himself. Denies any homicidal ideations" Case was staffed with Shaune Pollack DNP who recommended patient be re-evaluated in the a.m.   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 Alcohol abuse   . Anxiety   . Arthritis    "all over" (11/25/2015)  . Bipolar 1 disorder (HCC)   . Bipolar affective disorder (HCC)   . Chronic back pain   . Chronic kidney disease    . Cocaine abuse    smokes crack-cocaine  . Depression   . GERD (gastroesophageal reflux disease)   . Gout   . Headache    "q couple days; stress, tension, anger" (11/25/2015)  . Hemorrhoids   . Hepatitis C    "not yet treated" (11/25/2015)  . Hypertension   . Insomnia   . Myocardial infarction 11/2015   "supposedly" (11/25/2015)  . OSA on CPAP    "had mask; it was stolen" (11/25/2015)  . PTSD (post-traumatic stress disorder)   . Tobacco abuse   . Vitamin D deficiency     Past Surgical History:  Procedure Laterality Date  . CARDIAC CATHETERIZATION N/A 11/29/2015   Procedure: Left Heart Cath and Coronary Angiography;  Surgeon: Laurey Morale, MD;  Location: [facility] INVASIVE CV LAB;  Service: Cardiovascular;  Laterality: N/A;  . FRACTURE SURGERY    . PATELLA FRACTURE SURGERY Left ~ 1995  . TONSILLECTOMY      Family History:  Family History  Problem Relation Age of Onset  . Hypertension Brother   . Diabetes Brother   . Hypertension Mother   . Diabetes Mother   . Arthritis Mother   . Other Sister     bone disease    Social History:  reports that he has been smoking.  He has a 5.52 pack-year smoking history. He has never used smokeless tobacco. He reports that he drinks alcohol. He reports that he uses drugs, including Marijuana and "Crack" cocaine.  Additional Social History:  Alcohol / Drug Use Pain Medications: See MAR Prescriptions: See MAR Over the Counter: See MAR History of alcohol / drug use?: Yes Longest period of sobriety (when/how long): 1 year Withdrawal Symptoms: Tremors, Agitation Substance #1 Name of Substance 1: Cocaine 1 - Age of First Use: 32 1 - Amount (size/oz): 1 gram 1 - Frequency: daily 1 - Duration: Last 6 months 1 - Last Use / Amount: 03/17/16 1 gram Substance #2 Name of Substance 2: ETOH 2 - Age of First Use: 21 2 - Amount (size/oz): 3 to 4 40oz beers  2 - Frequency: two times a week 2 - Duration: Last 10 years 2 - Last Use / Amount: 03/17/16  5 12  oz beers  CIWA: CIWA-Ar BP: 131/79 Pulse Rate: 63 COWS:    Allergies:  Allergies  Allergen Reactions  . Lactose Intolerance (Gi) Hives and Nausea Only    Home Medications:  (Not in a hospital admission)  OB/GYN Status:  No LMP for male patient.  General Assessment Data Location of Assessment: [facility] ED TTS Assessment: In system Is this a Tele or Face-to-Face Assessment?: Face-to-Face Is this an Initial Assessment or a Re-assessment for this encounter?: Initial Assessment Marital status: Single Maiden name: na Is patient pregnant?: No Pregnancy Status: No Living Arrangements: Alone Can pt return to current living arrangement?: Yes Admission Status: Voluntary Is patient capable of signing voluntary admission?: Yes Referral Source: Self/Family/Friend Insurance type: Medicaid  Medical Screening Exam [facility] Walk-in ONLY) Medical Exam completed: Yes  Crisis Care Plan Living Arrangements: Alone Legal Guardian:  (na) Name of Psychiatrist: none Name of Therapist: none  Education Status Is patient currently in school?: No Current Grade: na Highest grade of school patient has completed: 12 Name of school: na Contact person: na  Risk to self with the past 6 months Suicidal Ideation: Yes-Currently Present Has patient been a risk to self within the past 6 months prior to admission? : Yes Suicidal Intent: Yes-Currently Present Has patient had any suicidal intent within the past 6 months prior to admission? : Yes Is patient at risk for suicide?: Yes Suicidal Plan?: Yes-Currently Present Has patient had any suicidal plan within the past 6 months prior to admission? : Yes Specify Current Suicidal Plan: cut wrist Access to Means: Yes Specify Access to Suicidal Means: pt has a knife What has been your use of drugs/alcohol within the last 12 months?: current use Previous Attempts/Gestures: Yes How many times?: 3 Other Self Harm Risks: none Triggers for Past Attempts:  Unknown Intentional Self Injurious Behavior: None Family Suicide History: No Recent stressful life event(s): Other (Comment) (homeless) Persecutory voices/beliefs?: No Depression: Yes Depression Symptoms: Loss of interest in usual pleasures, Feeling worthless/self pity Substance abuse history and/or treatment for substance abuse?: Yes Suicide prevention information given to non-admitted patients: Not applicable  Risk to Others within the past 6 months Homicidal Ideation: No Does patient have any lifetime risk of violence toward others beyond the six months prior to admission? : No Thoughts of Harm to Others: No Current Homicidal Intent: No Current Homicidal Plan: No Access to Homicidal Means: No Identified Victim: na History of harm to others?: No Assessment of Violence: None Noted Violent Behavior Description: na Does patient have access to weapons?: No Criminal Charges Pending?: No Does patient have a court date: No Is patient on probation?: No  Psychosis Hallucinations: None noted Delusions: None noted  Mental Status Report Appearance/Hygiene: In scrubs Eye Contact: Fair Motor Activity: Agitation Speech: Unremarkable Level of  Consciousness: Restless Mood: Anxious Affect: Anxious Anxiety Level: Moderate Thought Processes: Coherent, Relevant Judgement: Unimpaired Orientation: Person, Place, Time Obsessive Compulsive Thoughts/Behaviors: None  Cognitive Functioning Concentration: Normal Memory: Recent Intact, Remote Intact IQ: Average Insight: Fair Impulse Control: Poor Appetite: Fair Weight Loss: 0 Weight Gain: 0 Sleep: Decreased Total Hours of Sleep: 5 Vegetative Symptoms: None  ADLScreening [facility] Assessment Services) Patient's cognitive ability adequate to safely complete daily activities?: Yes Patient able to express need for assistance with ADLs?: Yes Independently performs ADLs?: Yes (appropriate for developmental age)  Prior Inpatient Therapy Prior  Inpatient Therapy: Yes Prior Therapy Dates: 2017 Prior Therapy Facilty/Provider(s): [facility] Reason for Treatment: S/I, MH issues  Prior Outpatient Therapy Prior Outpatient Therapy: No Prior Therapy Dates: na Prior Therapy Facilty/Provider(s): na Reason for Treatment: na  Does patient have an ACCT team?: No Does patient have Intensive In-House Services?  : No Does patient have [facility] services? : No Does patient have P4CC services?: No  ADL Screening (condition at time of admission) Patient's cognitive ability adequate to safely complete daily activities?: Yes Is the patient deaf or have difficulty hearing?: No Does the patient have difficulty seeing, even when wearing glasses/contacts?: No Does the patient have difficulty concentrating, remembering, or making decisions?: No Patient able to express need for assistance with ADLs?: Yes Does the patient have difficulty dressing or bathing?: No Independently performs ADLs?: Yes (appropriate for developmental age) Does the patient have difficulty walking or climbing stairs?: No Weakness of Legs: None Weakness of Arms/Hands: None  Home Assistive Devices/Equipment Home Assistive Devices/Equipment: None  Therapy Consults (therapy consults require a physician order) PT Evaluation Needed: No OT Evalulation Needed: No SLP Evaluation Needed: No Abuse/Neglect Assessment (Assessment to be complete while patient is alone) Physical Abuse: Denies Verbal Abuse: Denies Sexual Abuse: Denies Exploitation of patient/patient's resources: Denies Self-Neglect: Denies Values / Beliefs Cultural Requests During Hospitalization: None Spiritual Requests During Hospitalization: None Consults Spiritual Care Consult Needed: No Social Work Consult Needed: No Merchant [employer] officer (For Healthcare) Does Patient Have a Medical Advance Directive?: No Would patient like information on creating a medical advance directive?: No - Patient declined    Additional  Information 1:1 In Past 12 Months?: No CIRT Risk: No Elopement Risk: No Does patient have medical clearance?: Yes     Disposition: Case was staffed with Shaune Pollack DNP who recommended patient be re-evaluated in the a.m.  Disposition Initial Assessment Completed for this Encounter: Yes  On Site Evaluation by:   Reviewed with Physician:    Alfredia Ferguson 03/18/2016 3:41 PM

## 2016-03-18 NOTE — ED Notes (Signed)
SBAR Report received from previous nurse. Pt received calm and visible on unit. Pt denies current HI, A/V H, depression, anxiety, and rated pain as 6/10 chest at this time, and a[[ears otherwise stable Pt endorses SI but contracts for safety until morning. Pt reminded of camera surveillance, q 15 min rounds, and rules of the milieu. Pt screened for contraband by writer, IV removed,  will continue to assess.

## 2016-03-19 DIAGNOSIS — Z8249 Family history of ischemic heart disease and other diseases of the circulatory system: Secondary | ICD-10-CM | POA: Diagnosis not present

## 2016-03-19 DIAGNOSIS — Z8261 Family history of arthritis: Secondary | ICD-10-CM

## 2016-03-19 DIAGNOSIS — Z9889 Other specified postprocedural states: Secondary | ICD-10-CM

## 2016-03-19 DIAGNOSIS — F3130 Bipolar disorder, current episode depressed, mild or moderate severity, unspecified: Secondary | ICD-10-CM

## 2016-03-19 DIAGNOSIS — Z833 Family history of diabetes mellitus: Secondary | ICD-10-CM | POA: Diagnosis not present

## 2016-03-19 DIAGNOSIS — Z79899 Other long term (current) drug therapy: Secondary | ICD-10-CM | POA: Diagnosis not present

## 2016-03-19 DIAGNOSIS — Z91011 Allergy to milk products: Secondary | ICD-10-CM | POA: Diagnosis not present

## 2016-03-19 DIAGNOSIS — R45851 Suicidal ideations: Secondary | ICD-10-CM

## 2016-03-19 DIAGNOSIS — F1414 Cocaine abuse with cocaine-induced mood disorder: Secondary | ICD-10-CM | POA: Diagnosis not present

## 2016-03-19 DIAGNOSIS — F17210 Nicotine dependence, cigarettes, uncomplicated: Secondary | ICD-10-CM | POA: Diagnosis not present

## 2016-03-19 MED ORDER — LORAZEPAM 2 MG/ML IJ SOLN: 1.0000 mg | Freq: Once | INTRAMUSCULAR | Status: DC

## 2016-03-19 MED ORDER — ZIPRASIDONE MESYLATE 20 MG IM SOLR: 20.0000 mg | Freq: Once | INTRAMUSCULAR | Status: DC

## 2016-03-19 MED ORDER — OLANZAPINE 10 MG PO TBDP
10.0000 mg | ORAL_TABLET | Freq: Three times a day (TID) | ORAL | Status: DC | PRN
  Administered 2016-03-19: 10 mg via ORAL
  Filled 2016-03-19: qty 1

## 2016-03-19 MED ORDER — DIPHENHYDRAMINE HCL 50 MG/ML IJ SOLN: 50.0000 mg | Freq: Once | INTRAMUSCULAR | Status: DC

## 2016-03-19 MED ORDER — OLANZAPINE 5 MG PO TBDP
5.0000 mg | ORAL_TABLET | Freq: Two times a day (BID) | ORAL | Status: DC
Start: 2016-03-19 — End: 2016-03-20
  Administered 2016-03-19 – 2016-03-20 (×2): 5 mg via ORAL
  Filled 2016-03-19 (×2): qty 1

## 2016-03-19 MED ORDER — OLANZAPINE 5 MG PO TBDP
5.0000 mg | ORAL_TABLET | Freq: Two times a day (BID) | ORAL | Status: DC
  Filled 2016-03-19: qty 1

## 2016-03-19 NOTE — ED Notes (Addendum)
SBAR Report received from previous nurse. Pt received calm and visible on unit. Pt denies current  HI, A/V H,  and pain at this time, continues to endorse SI anxiey and chest pain but appears otherwise stable as pt was asleep prior to interview. Pt reminded of camera surveillance, q 15 min rounds, and rules of the milieu. Will continue to assess.

## 2016-03-19 NOTE — ED Notes (Signed)
This nurse called phlebotmist for ordered BMP.

## 2016-03-19 NOTE — ED Notes (Addendum)
Pt in room hitting head on wall, pt presents with agitation and anxiety, expressing anger in regards to psychiatry team mentioning discharge this morning during rounds. Encouragement and support provided, [employer] present. MD made aware.

## 2016-03-19 NOTE — ED Notes (Signed)
Raised bump noted on center of pt forehead, Drenda Freeze NP notified, ice pack provided.

## 2016-03-19 NOTE — Consult Note (Signed)
[facility] Consult   Reason for Consult:  Psychiatric evaluation Referring Physician:  EDP Patient Identification: [name] MRN:  [id number] Principal Diagnosis: Bipolar I disorder, most recent episode depressed [facility]) Diagnosis:   Patient Active Problem List   Diagnosis Date Noted  . Abnormal nuclear stress test [R94.39]   . Palpitations [R00.2] 11/26/2015  . Troponin level elevated [R74.8] 11/26/2015  . Homeless [Z59.0] 11/26/2015  . Essential hypertension [I10] 11/26/2015  . SVT (supraventricular tachycardia) ([place]) [I47.1]   . Pain in the chest [R07.9] 11/25/2015  . GERD (gastroesophageal reflux disease) [K21.9]   . Bipolar 1 disorder ([place]) [F31.9]   . Back pain, chronic [M54.9, G89.29]   . Chronic kidney disease [N18.9]   . Affective psychosis, bipolar ([place]) [F31.9]   . Alcohol use disorder, severe, dependence ([place]) [F10.20]   . Cocaine use disorder, severe, dependence ([place]) [F14.20]   . Generalized abdominal pain [R10.84]   . Bipolar I disorder, most recent episode depressed ([place]) [F31.30]   . Bipolar disorder, current episode depressed, severe, without psychotic features ([place]) [F31.4]   . Bipolar affective disorder, depressed, severe ([place]) [F31.4] 03/26/2014  . Right hip pain [M25.551]   . Medically noncompliant [Z91.19] 03/23/2014  . Dysuria [R30.0] 03/22/2013  . Headache [R51] 03/22/2013  . Polysubstance dependence ([place]) [F19.20] 03/21/2012  . Anxiety [F41.9]   . Bipolar affective disorder ([place]) [F31.9]   . Polysubstance abuse [F19.10]   . Gout [M10.9]   . Vitamin D deficiency [E55.9]   . Hepatitis C [B19.20]     Total Time spent with patient: 30 minutes  Subjective:   [name] is a 55 y.o. male patient admitted who states "I need help with addiction."  HPI:  Per behavioral health therapeutic triage assessment, [name] is an 55 y.o. male that presents this date with thoughts of self harm with a plan to overdose on cocaine or cut his  wrist. Patient is oriented to time/place and denies any H/I or AVH. Patient reported that he has "had enough of living like this" and wants to end his life. Patient is requesting long term treatment to assist with SA issues. Patient has multiple admission to [facility] in 2017 for similar symptoms and fails to follow up with after care due to patient stating, "it doesn't work." Patient reports ongoing depression to include symptoms of feeling worthless and loss of family members. Patient denies ever being on any MH medications and is very vague in reference to symptoms and treatment history. Patient is agitated during assessment reporting withdrawals from "using a lot of crack." Patient states "he cannot control his emotions" and has "the shakes." Patient reports daily cocaine use stating he uses 1 gram a day with last reported use on 03/17/16 when patient states he used "a whole lot" trying to "make his heart explode." Patient reports frequent ETOH use but is vague in reference to amounts. Admission note stated: "Patient   presents with suicidal ideations and suicide attempt by smoking crack cocaine. History of suicide attempt in the past. Patient states he ate has a history of bipolar disorder as well as PTSD is not been on his medications in the past 2 months. States he has been responding to internal stimuli tell him to harm himself. Denies any homicidal ideations.  [facility] evaluation on 03/19/16: Chart and nursing notes reviewed. Face-to-face evaluation completed with Dr. Darleene Cleaver. The patient is alert, oriented x 4, calm and cooperative. He states he is homeless. 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 His UDS is positive for cocaine and THC. He voices that he would like to pursue placement in a long term residential facility for substance abuse treatment.   The patient was informed the emergency department does not offer long term treatment for substance abuse. Therapeutic triage counselor offered to refer the patient to [facility] to start the process of locating a long term residential treatment facility.   The patient became upset that he was being discharged from the hospital with a referral. The patient became agitated and began banging his head on the door and wall of his room. Security and [employer] intervened to maintain patient safety.   Past Psychiatric History: PTSD, Depression, Bipolar, Anxiety, Alcohol abuse  Risk to Self: Suicidal Ideation: Yes-Currently Present Suicidal Intent: Yes-Currently Present Is patient at risk for suicide?: Yes Suicidal Plan?: Yes-Currently Present Specify Current Suicidal Plan: cut wrist Access to Means: Yes Specify Access to Suicidal Means: pt has a knife What has been your use of drugs/alcohol within the last 12 months?: current use How many times?: 3 Other Self Harm Risks: none Triggers for Past Attempts: Unknown Intentional Self Injurious Behavior: None Risk to Others: Homicidal Ideation: No Thoughts of Harm to Others: No Current Homicidal Intent: No Current Homicidal Plan: No Access to Homicidal Means: No Identified Victim: na History of harm to others?: No Assessment of Violence: None Noted Violent Behavior Description: na Does patient have access to weapons?: No Criminal Charges Pending?: No Does patient have a court date: No Prior Inpatient Therapy: Prior Inpatient Therapy: Yes Prior Therapy Dates: 2017 Prior Therapy Facilty/Provider(s): [facility] Reason for Treatment: S/I, MH  issues Prior Outpatient Therapy: Prior Outpatient Therapy: No Prior Therapy Dates: na Prior Therapy Facilty/Provider(s): na Reason for Treatment: na  Does patient have an ACCT team?: No Does patient have Intensive In-House Services?  : No Does patient have [facility] services? : No Does patient have P4CC services?: No  Past Medical History:  Past Medical History:  Diagnosis Date  . Alcohol abuse   . Anxiety   . Arthritis    "all over" (11/25/2015)  . Bipolar 1 disorder ([place])   . Bipolar affective disorder ([place])   . Chronic back pain   . Chronic kidney disease   . Cocaine abuse    smokes crack-cocaine  . Depression   . GERD (gastroesophageal reflux disease)   . Gout   . Headache    "q couple days; stress, tension, anger" (11/25/2015)  . Hemorrhoids   . Hepatitis C    "not yet treated" (11/25/2015)  . Hypertension   . Insomnia   . Myocardial infarction 11/2015   "supposedly" (11/25/2015)  . OSA on CPAP    "had mask; it was stolen" (11/25/2015)  . PTSD (post-traumatic stress disorder)   . Tobacco abuse   . Vitamin D deficiency     Past Surgical History:  Procedure Laterality Date  . CARDIAC CATHETERIZATION N/A 11/29/2015   Procedure: Left Heart Cath and Coronary  Angiography;  Surgeon: Larey Dresser, MD;  Location: [facility];  Service: Cardiovascular;  Laterality: N/A;  . FRACTURE SURGERY    . PATELLA FRACTURE SURGERY Left ~ 1995  . TONSILLECTOMY     Family History:  Family History  Problem Relation Age of Onset  . Hypertension Brother   . Diabetes Brother   . Hypertension Mother   . Diabetes Mother   . Arthritis Mother   . Other Sister     bone disease   Family Psychiatric  History:  Social History:  History  Alcohol Use  . Yes    Comment: 11/25/2015 "quit alcohol 6 months ago; used to drink from morning til night"     History  Drug Use  . Types: Marijuana, "Crack" cocaine    Comment: 11/25/2015 "no marijuana in the last 6 months; 3 wks clean from  crack cocaine"    Social History   Social History  . Marital status: Single    Spouse name: N/A  . Number of children: N/A  . Years of education: N/A   Social History Main Topics  . Smoking status: Current Every Day Smoker    Packs/day: 0.12    Years: 46.00  . Smokeless tobacco: Never Used  . Alcohol use Yes     Comment: 11/25/2015 "quit alcohol 6 months ago; used to drink from morning til night"  . Drug use:     Types: Marijuana, "Crack" cocaine     Comment: 11/25/2015 "no marijuana in the last 6 months; 3 wks clean from crack cocaine"  . Sexual activity: Not Currently   Other Topics Concern  . None   Social History Narrative   ** Merged History Encounter **       Lives alone in [place], [place]   Additional Social History:    Allergies:   Allergies  Allergen Reactions  . Lactose Intolerance (Gi) Hives and Nausea Only    Labs:  Results for orders placed or performed during the hospital encounter of 03/18/16 (from the past 48 hour(s))  Troponin I     Status: None   Collection Time: 03/18/16 12:05 PM  Result Value Ref Range   Troponin I <0.03 <0.03 ng/mL  CBC with Differential/Platelet     Status: None   Collection Time: 03/18/16 12:05 PM  Result Value Ref Range   WBC 6.9 4.0 - 10.5 K/uL   RBC 5.11 4.22 - 5.81 MIL/uL   Hemoglobin 14.4 13.0 - 17.0 g/dL   HCT 41.7 39.0 - 52.0 %   MCV 81.6 78.0 - 100.0 fL   MCH 28.2 26.0 - 34.0 pg   MCHC 34.5 30.0 - 36.0 g/dL   RDW 14.8 11.5 - 15.5 %   Platelets 269 150 - 400 K/uL   Neutrophils Relative % 65 %   Neutro Abs 4.4 1.7 - 7.7 K/uL   Lymphocytes Relative 25 %   Lymphs Abs 1.7 0.7 - 4.0 K/uL   Monocytes Relative 9 %   Monocytes Absolute 0.6 0.1 - 1.0 K/uL   Eosinophils Relative 1 %   Eosinophils Absolute 0.1 0.0 - 0.7 K/uL   Basophils Relative 0 %   Basophils Absolute 0.0 0.0 - 0.1 K/uL  Comprehensive metabolic panel     Status: Abnormal   Collection Time: 03/18/16 12:05 PM  Result Value Ref Range   Sodium 137  135 - 145 mmol/L   Potassium 2.9 (L) 3.5 - 5.1 mmol/L   Chloride 105 101 - 111 mmol/L  CO2 23 22 - 32 mmol/L   Glucose, Bld 93 65 - 99 mg/dL   BUN 8 6 - 20 mg/dL   Creatinine, Ser 0.84 0.61 - 1.24 mg/dL   Calcium 9.0 8.9 - 10.3 mg/dL   Total Protein 7.0 6.5 - 8.1 g/dL   Albumin 4.1 3.5 - 5.0 g/dL   AST 219 (H) 15 - 41 U/L   ALT 390 (H) 17 - 63 U/L   Alkaline Phosphatase 45 38 - 126 U/L   Total Bilirubin 0.6 0.3 - 1.2 mg/dL   GFR calc non Af Amer >60 >60 mL/min   GFR calc Af Amer >60 >60 mL/min    Comment: (NOTE) The eGFR has been calculated using the CKD EPI equation. This calculation has not been validated in all clinical situations. eGFR's persistently <60 mL/min signify possible Chronic Kidney Disease.    Anion gap 9 5 - 15  Ethanol     Status: None   Collection Time: 03/18/16 12:05 PM  Result Value Ref Range   Alcohol, Ethyl (B) <5 <5 mg/dL    Comment:        LOWEST DETECTABLE LIMIT FOR SERUM ALCOHOL IS 5 mg/dL FOR MEDICAL PURPOSES ONLY   Salicylate level     Status: None   Collection Time: 03/18/16 12:05 PM  Result Value Ref Range   Salicylate Lvl <5.8 2.8 - 30.0 mg/dL  Acetaminophen level     Status: Abnormal   Collection Time: 03/18/16 12:05 PM  Result Value Ref Range   Acetaminophen (Tylenol), Serum <10 (L) 10 - 30 ug/mL    Comment:        THERAPEUTIC CONCENTRATIONS VARY SIGNIFICANTLY. A RANGE OF 10-30 ug/mL MAY BE AN EFFECTIVE CONCENTRATION FOR MANY PATIENTS. HOWEVER, SOME ARE BEST TREATED AT CONCENTRATIONS OUTSIDE THIS RANGE. ACETAMINOPHEN CONCENTRATIONS >150 ug/mL AT 4 HOURS AFTER INGESTION AND >50 ug/mL AT 12 HOURS AFTER INGESTION ARE OFTEN ASSOCIATED WITH TOXIC REACTIONS.   Rapid urine drug screen (hospital performed)     Status: Abnormal   Collection Time: 03/18/16 12:38 PM  Result Value Ref Range   Opiates NONE DETECTED NONE DETECTED   Cocaine POSITIVE (A) NONE DETECTED   Benzodiazepines NONE DETECTED NONE DETECTED   Amphetamines NONE  DETECTED NONE DETECTED   Tetrahydrocannabinol POSITIVE (A) NONE DETECTED   Barbiturates NONE DETECTED NONE DETECTED    Comment:        DRUG SCREEN FOR MEDICAL PURPOSES ONLY.  IF CONFIRMATION IS NEEDED FOR ANY PURPOSE, NOTIFY LAB WITHIN 5 DAYS.        LOWEST DETECTABLE LIMITS FOR URINE DRUG SCREEN Drug Class       Cutoff (ng/mL) Amphetamine      1000 Barbiturate      200 Benzodiazepine   850 Tricyclics       277 Opiates          300 Cocaine          300 THC              50     Current Facility-Administered Medications  Medication Dose Route Frequency Provider Last Rate Last Dose  . acetaminophen (TYLENOL) tablet 650 mg  650 mg Oral Q4H PRN Lacretia Leigh, MD      . alum & mag hydroxide-simeth (MAALOX/MYLANTA) 200-200-20 MG/5ML suspension 30 mL  30 mL Oral PRN Lacretia Leigh, MD      . diltiazem (CARDIZEM CD) 24 hr capsule 240 mg  240 mg Oral Daily Patrecia Pour, NP   240  mg at 03/19/16 0933  . diphenhydrAMINE (BENADRYL) injection 50 mg  50 mg Intramuscular Once Achilles Neville, MD      . gabapentin (NEURONTIN) capsule 300 mg  300 mg Oral TID Patrecia Pour, NP   300 mg at 03/19/16 1638  . hydrOXYzine (ATARAX/VISTARIL) tablet 25 mg  25 mg Oral TID PRN Patrecia Pour, NP      . ibuprofen (ADVIL,MOTRIN) tablet 600 mg  600 mg Oral Q8H PRN Lacretia Leigh, MD      . LORazepam (ATIVAN) injection 1 mg  1 mg Intramuscular Once Kellin Fifer, MD      . OLANZapine zydis (ZYPREXA) disintegrating tablet 10 mg  10 mg Oral Q8H PRN Corena Pilgrim, MD   10 mg at 03/19/16 1044  . OLANZapine zydis (ZYPREXA) disintegrating tablet 5 mg  5 mg Oral BID PC Dj Senteno, MD      . ondansetron (ZOFRAN) tablet 4 mg  4 mg Oral Q8H PRN Lacretia Leigh, MD      . pantoprazole (PROTONIX) EC tablet 40 mg  40 mg Oral Daily Patrecia Pour, NP   40 mg at 03/19/16 0933  . tamsulosin (FLOMAX) capsule 0.4 mg  0.4 mg Oral Daily Patrecia Pour, NP   0.4 mg at 03/19/16 0933  . traZODone (DESYREL) tablet 100 mg  100 mg  Oral QHS PRN Patrecia Pour, NP   100 mg at 03/18/16 2118  . ziprasidone (GEODON) injection 20 mg  20 mg Intramuscular Once Corena Pilgrim, MD       Current Outpatient Prescriptions  Medication Sig Dispense Refill  . ibuprofen (ADVIL,MOTRIN) 200 MG tablet Take 1,000 mg by mouth every 4 (four) hours as needed for moderate pain.    Marland Kitchen diltiazem (CARDIZEM CD) 240 MG 24 hr capsule Take 1 capsule (240 mg total) by mouth daily. (Patient not taking: Reported on 03/18/2016) 30 capsule 3  . gabapentin (NEURONTIN) 300 MG capsule Take 1 capsule (300 mg total) by mouth 3 (three) times daily. (Patient not taking: Reported on 03/18/2016) 90 capsule 0  . hydrOXYzine (ATARAX/VISTARIL) 25 MG tablet Take 1 tablet (25 mg total) by mouth 3 (three) times daily as needed for anxiety. (Patient not taking: Reported on 03/18/2016) 30 tablet 0  . pantoprazole (PROTONIX) 40 MG tablet Take 1 tablet (40 mg total) by mouth daily. (Patient not taking: Reported on 03/18/2016)    . tamsulosin (FLOMAX) 0.4 MG CAPS capsule Take 1 capsule (0.4 mg total) by mouth daily. (Patient not taking: Reported on 03/18/2016) 30 capsule 0  . traZODone (DESYREL) 100 MG tablet Take 1 tablet (100 mg total) by mouth at bedtime as needed for sleep. (Patient not taking: Reported on 03/18/2016) 30 tablet 0    Musculoskeletal: Strength & Muscle Tone: within normal limits Gait & Station: normal Patient leans: N/A  Psychiatric Specialty Exam: Physical Exam  Nursing note and vitals reviewed.   Review of Systems  Psychiatric/Behavioral: Positive for depression, substance abuse and suicidal ideas.  All other systems reviewed and are negative.   Blood pressure 117/70, pulse 100, temperature 97.9 F (36.6 C), temperature source Oral, resp. rate 16, SpO2 98 %.There is no height or weight on file to calculate BMI.  General Appearance: Fairly Groomed  Eye Contact:  Minimal  Speech:  Clear and Coherent and Normal Rate  Volume:  Decreased  Mood:  Depressed   Affect:  Congruent  Thought Process:  Coherent and Descriptions of Associations: Intact  Orientation:  Full (Time, Place, and Person)  Thought Content:  Logical  Suicidal Thoughts:  Yes.  without intent/plan  Homicidal Thoughts:  No  Memory:  Immediate;   Fair Recent;   Fair  Judgement:  Poor  Insight:  Lacking  Psychomotor Activity:  Decreased  Concentration:  Concentration: Fair and Attention Span: Fair  Recall:  [place] of Knowledge:  Good  Language:  Good  Akathisia:  No  Handed:  Right  AIMS (if indicated):     Assets:  Communication Skills Desire for Improvement Financial Resources/Insurance Physical Health  ADL's:  Intact  Cognition:  WNL  Sleep:       Case discussed with Dr. Darleene Cleaver; recommendations are: Treatment Plan Summary: Daily contact with patient to assess and evaluate symptoms and progress in treatment and Medication management  Continue home medication as previously ordered for medical conditions Gabapentin 300 mg by mouth TID  Vistaril 25 mg by mouth TID prn anxiety Trazodone 100 mg by mouth at bedtime prn insomnia Agitation protocol  Zyprexa zydis 10 mg by mouth every 8 hours prn agitation  Zyprexa zydis 5 mg by mouth twice daily for mood stabilization    Disposition: Overnight observation in [place], FNP-BC [place] 03/19/2016 12:26 PM  Patient seen face-to-face for psychiatric evaluation, chart reviewed and case discussed with the physician extender and developed treatment plan. Reviewed the information documented and agree with the treatment plan. Corena Pilgrim, MD

## 2016-03-19 NOTE — ED Notes (Signed)
Pt observed laying in bed, eyes closed, appears to be sleeping. No s/s of distress, breathing non labored, respirations equal. Will continue to monitor. Special checks q 15 mins in place for safety. Video monitoring in place.

## 2016-03-20 DIAGNOSIS — Z8249 Family history of ischemic heart disease and other diseases of the circulatory system: Secondary | ICD-10-CM | POA: Diagnosis not present

## 2016-03-20 DIAGNOSIS — F1414 Cocaine abuse with cocaine-induced mood disorder: Secondary | ICD-10-CM | POA: Diagnosis not present

## 2016-03-20 DIAGNOSIS — Z8261 Family history of arthritis: Secondary | ICD-10-CM | POA: Diagnosis not present

## 2016-03-20 DIAGNOSIS — Z91011 Allergy to milk products: Secondary | ICD-10-CM | POA: Diagnosis not present

## 2016-03-20 DIAGNOSIS — Z79899 Other long term (current) drug therapy: Secondary | ICD-10-CM | POA: Diagnosis not present

## 2016-03-20 DIAGNOSIS — Z9889 Other specified postprocedural states: Secondary | ICD-10-CM | POA: Diagnosis not present

## 2016-03-20 DIAGNOSIS — Z833 Family history of diabetes mellitus: Secondary | ICD-10-CM | POA: Diagnosis not present

## 2016-03-20 DIAGNOSIS — F17210 Nicotine dependence, cigarettes, uncomplicated: Secondary | ICD-10-CM | POA: Diagnosis not present

## 2016-03-20 LAB — BASIC METABOLIC PANEL
Anion gap: 8 (ref 5–15)
BUN: 11 mg/dL (ref 6–20)
CO2: 25 mmol/L (ref 22–32)
Calcium: 8.6 mg/dL — ABNORMAL LOW (ref 8.9–10.3)
Chloride: 107 mmol/L (ref 101–111)
Creatinine, Ser: 0.95 mg/dL (ref 0.61–1.24)
GFR calc Af Amer: >60 mL/min (ref >60)
GFR calc non Af Amer: >60 mL/min (ref >60)
Glucose, Bld: 115 mg/dL — ABNORMAL HIGH (ref 65–99)
Potassium: 3 mmol/L — ABNORMAL LOW (ref 3.5–5.1)
Sodium: 140 mmol/L (ref 135–145)

## 2016-03-20 NOTE — ED Notes (Signed)
Introduced self to patient. Pt oriented to unit expectations.  Assessed pt for:  A) Anxiety &/or agitation: Pt reports less anxiety and he is not agitated. He has stayed in bed this morning and is calm and cooperative. No complaints voiced.   S) Safety: Safety maintained with q-15-minute checks and hourly rounds by staff.  A) ADLs: Pt able to perform ADLs independently.  P) Pick-Up (room cleanliness): Pt's room clean and free of clutter.

## 2016-03-20 NOTE — Consult Note (Signed)
[place] [facility] Consult   Reason for Consult:  Cocaine abuse with suicidal ideations Referring Physician:  EDP Patient Identification: [name] MRN:  [id number] Principal Diagnosis: Cocaine abuse with cocaine-induced mood disorder [facility]) Diagnosis:   Patient Active Problem List   Diagnosis Date Noted  . Cocaine abuse with cocaine-induced mood disorder [facility]) [F14.14] 03/20/2016    Priority: High  . Polysubstance dependence ([place]) [F19.20] 03/21/2012    Priority: High  . Anxiety [F41.9]     Priority: High  . Bipolar affective disorder ([place]) [F31.9]     Priority: High  . Polysubstance abuse [F19.10]     Priority: High  . Abnormal nuclear stress test [R94.39]   . Palpitations [R00.2] 11/26/2015  . Troponin level elevated [R74.8] 11/26/2015  . Homeless [Z59.0] 11/26/2015  . Essential hypertension [I10] 11/26/2015  . SVT (supraventricular tachycardia) ([place]) [I47.1]   . Pain in the chest [R07.9] 11/25/2015  . GERD (gastroesophageal reflux disease) [K21.9]   . Bipolar 1 disorder ([place]) [F31.9]   . Back pain, chronic [M54.9, G89.29]   . Chronic kidney disease [N18.9]   . Affective psychosis, bipolar ([place]) [F31.9]   . Alcohol use disorder, severe, dependence ([place]) [F10.20]   . Cocaine use disorder, severe, dependence ([place]) [F14.20]   . Generalized abdominal pain [R10.84]   . Bipolar I disorder, most recent episode depressed ([place]) [F31.30]   . Bipolar disorder, current episode depressed, severe, without psychotic features ([place]) [F31.4]   . Bipolar affective disorder, depressed, severe ([place]) [F31.4] 03/26/2014  . Right hip pain [M25.551]   . Medically noncompliant [Z91.19] 03/23/2014  . Dysuria [R30.0] 03/22/2013  . Headache [R51] 03/22/2013  . Gout [M10.9]   . Vitamin D deficiency [E55.9]   . Hepatitis C [B19.20]     Total Time spent with patient: 30 minutes  Subjective:   [name] is a 55 y.o. male patient does not warrant admission.  HPI:  55 yo male who  presented after cocaine abuse with suicidal ideations.  He is well known to this system and providers as he presents frequently for similar symptoms.  [name] also just got out of [facility] and has been frequenting nearby hospitals.  He is homeless but his parents state that he can return if he stops using drugs.  [name] denies suicidal/homicidal ideations, hallucinations, and withdrawal symptoms.  Past Psychiatric History: cocaine abuse, depression  Risk to Self: No Risk to Others: Homicidal Ideation: No Thoughts of Harm to Others: No Current Homicidal Intent: No Current Homicidal Plan: No Access to Homicidal Means: No Identified Victim: na History of harm to others?: No Assessment of Violence: None Noted Violent Behavior Description: na Does patient have access to weapons?: No Criminal Charges Pending?: No Does patient have a court date: No Prior Inpatient Therapy: Prior Inpatient Therapy: Yes Prior Therapy Dates: 2017 Prior Therapy Facilty/Provider(s): [facility] Reason for Treatment: S/I, MH issues Prior Outpatient Therapy: Prior Outpatient Therapy: No Prior Therapy Dates: na Prior Therapy Facilty/Provider(s): na Reason for Treatment: na  Does patient have an ACCT team?: No Does patient have Intensive In-House Services?  : No Does patient have [facility] services? : No Does patient have P4CC services?: No  Past Medical History:  Past Medical History:  Diagnosis Date  . Alcohol abuse   . Anxiety   . Arthritis    "all over" (11/25/2015)  . Bipolar 1 disorder ([place])   . Bipolar affective disorder ([place])   . Chronic back pain   . Chronic kidney disease   . Cocaine  abuse    smokes crack-cocaine  . Depression   . GERD (gastroesophageal reflux disease)   . Gout   . Headache    "q couple days; stress, tension, anger" (11/25/2015)  . Hemorrhoids   . Hepatitis C    "not yet treated" (11/25/2015)  . Hypertension   . Insomnia   . Myocardial infarction 11/2015   "supposedly" (11/25/2015)  . OSA on  CPAP    "had mask; it was stolen" (11/25/2015)  . PTSD (post-traumatic stress disorder)   . Tobacco abuse   . Vitamin D deficiency     Past Surgical History:  Procedure Laterality Date  . CARDIAC CATHETERIZATION N/A 11/29/2015   Procedure: Left Heart Cath and Coronary Angiography;  Surgeon: Larey Dresser, MD;  Location: [facility];  Service: Cardiovascular;  Laterality: N/A;  . FRACTURE SURGERY    . PATELLA FRACTURE SURGERY Left ~ 1995  . TONSILLECTOMY     Family History:  Family History  Problem Relation Age of Onset  . Hypertension Brother   . Diabetes Brother   . Hypertension Mother   . Diabetes Mother   . Arthritis Mother   . Other Sister     bone disease   Family Psychiatric  History: none Social History:  History  Alcohol Use  . Yes    Comment: 11/25/2015 "quit alcohol 6 months ago; used to drink from morning til night"     History  Drug Use  . Types: Marijuana, "Crack" cocaine    Comment: 11/25/2015 "no marijuana in the last 6 months; 3 wks clean from crack cocaine"    Social History   Social History  . Marital status: Single    Spouse name: N/A  . Number of children: N/A  . Years of education: N/A   Social History Main Topics  . Smoking status: Current Every Day Smoker    Packs/day: 0.12    Years: 46.00  . Smokeless tobacco: Never Used  . Alcohol use Yes     Comment: 11/25/2015 "quit alcohol 6 months ago; used to drink from morning til night"  . Drug use:     Types: Marijuana, "Crack" cocaine     Comment: 11/25/2015 "no marijuana in the last 6 months; 3 wks clean from crack cocaine"  . Sexual activity: Not Currently   Other Topics Concern  . None   Social History Narrative   ** Merged History Encounter **       Lives alone in [place], [place]   Additional Social History:    Allergies:   Allergies  Allergen Reactions  . Lactose Intolerance (Gi) Hives and Nausea Only    Labs:  Results for orders placed or performed during the hospital  encounter of 03/18/16 (from the past 48 hour(s))  Troponin I     Status: None   Collection Time: 03/18/16 12:05 PM  Result Value Ref Range   Troponin I <0.03 <0.03 ng/mL  CBC with Differential/Platelet     Status: None   Collection Time: 03/18/16 12:05 PM  Result Value Ref Range   WBC 6.9 4.0 - 10.5 K/uL   RBC 5.11 4.22 - 5.81 MIL/uL   Hemoglobin 14.4 13.0 - 17.0 g/dL   HCT 41.7 39.0 - 52.0 %   MCV 81.6 78.0 - 100.0 fL   MCH 28.2 26.0 - 34.0 pg   MCHC 34.5 30.0 - 36.0 g/dL   RDW 14.8 11.5 - 15.5 %   Platelets 269 150 - 400 K/uL   Neutrophils  Relative % 65 %   Neutro Abs 4.4 1.7 - 7.7 K/uL   Lymphocytes Relative 25 %   Lymphs Abs 1.7 0.7 - 4.0 K/uL   Monocytes Relative 9 %   Monocytes Absolute 0.6 0.1 - 1.0 K/uL   Eosinophils Relative 1 %   Eosinophils Absolute 0.1 0.0 - 0.7 K/uL   Basophils Relative 0 %   Basophils Absolute 0.0 0.0 - 0.1 K/uL  Comprehensive metabolic panel     Status: Abnormal   Collection Time: 03/18/16 12:05 PM  Result Value Ref Range   Sodium 137 135 - 145 mmol/L   Potassium 2.9 (L) 3.5 - 5.1 mmol/L   Chloride 105 101 - 111 mmol/L   CO2 23 22 - 32 mmol/L   Glucose, Bld 93 65 - 99 mg/dL   BUN 8 6 - 20 mg/dL   Creatinine, Ser 0.84 0.61 - 1.24 mg/dL   Calcium 9.0 8.9 - 10.3 mg/dL   Total Protein 7.0 6.5 - 8.1 g/dL   Albumin 4.1 3.5 - 5.0 g/dL   AST 219 (H) 15 - 41 U/L   ALT 390 (H) 17 - 63 U/L   Alkaline Phosphatase 45 38 - 126 U/L   Total Bilirubin 0.6 0.3 - 1.2 mg/dL   GFR calc non Af Amer >60 >60 mL/min   GFR calc Af Amer >60 >60 mL/min    Comment: (NOTE) The eGFR has been calculated using the CKD EPI equation. This calculation has not been validated in all clinical situations. eGFR's persistently <60 mL/min signify possible Chronic Kidney Disease.    Anion gap 9 5 - 15  Ethanol     Status: None   Collection Time: 03/18/16 12:05 PM  Result Value Ref Range   Alcohol, Ethyl (B) <5 <5 mg/dL    Comment:        LOWEST DETECTABLE LIMIT  FOR SERUM ALCOHOL IS 5 mg/dL FOR MEDICAL PURPOSES ONLY   Salicylate level     Status: None   Collection Time: 03/18/16 12:05 PM  Result Value Ref Range   Salicylate Lvl <6.9 2.8 - 30.0 mg/dL  Acetaminophen level     Status: Abnormal   Collection Time: 03/18/16 12:05 PM  Result Value Ref Range   Acetaminophen (Tylenol), Serum <10 (L) 10 - 30 ug/mL    Comment:        THERAPEUTIC CONCENTRATIONS VARY SIGNIFICANTLY. A RANGE OF 10-30 ug/mL MAY BE AN EFFECTIVE CONCENTRATION FOR MANY PATIENTS. HOWEVER, SOME ARE BEST TREATED AT CONCENTRATIONS OUTSIDE THIS RANGE. ACETAMINOPHEN CONCENTRATIONS >150 ug/mL AT 4 HOURS AFTER INGESTION AND >50 ug/mL AT 12 HOURS AFTER INGESTION ARE OFTEN ASSOCIATED WITH TOXIC REACTIONS.   Rapid urine drug screen (hospital performed)     Status: Abnormal   Collection Time: 03/18/16 12:38 PM  Result Value Ref Range   Opiates NONE DETECTED NONE DETECTED   Cocaine POSITIVE (A) NONE DETECTED   Benzodiazepines NONE DETECTED NONE DETECTED   Amphetamines NONE DETECTED NONE DETECTED   Tetrahydrocannabinol POSITIVE (A) NONE DETECTED   Barbiturates NONE DETECTED NONE DETECTED    Comment:        DRUG SCREEN FOR MEDICAL PURPOSES ONLY.  IF CONFIRMATION IS NEEDED FOR ANY PURPOSE, NOTIFY LAB WITHIN 5 DAYS.        LOWEST DETECTABLE LIMITS FOR URINE DRUG SCREEN Drug Class       Cutoff (ng/mL) Amphetamine      1000 Barbiturate      200 Benzodiazepine   450 Tricyclics  300 Opiates          300 Cocaine          300 THC              50   Basic metabolic panel     Status: Abnormal   Collection Time: 03/20/16  5:10 AM  Result Value Ref Range   Sodium 140 135 - 145 mmol/L   Potassium 3.0 (L) 3.5 - 5.1 mmol/L   Chloride 107 101 - 111 mmol/L   CO2 25 22 - 32 mmol/L   Glucose, Bld 115 (H) 65 - 99 mg/dL   BUN 11 6 - 20 mg/dL   Creatinine, Ser 0.95 0.61 - 1.24 mg/dL   Calcium 8.6 (L) 8.9 - 10.3 mg/dL   GFR calc non Af Amer >60 >60 mL/min   GFR calc Af Amer >60  >60 mL/min    Comment: (NOTE) The eGFR has been calculated using the CKD EPI equation. This calculation has not been validated in all clinical situations. eGFR's persistently <60 mL/min signify possible Chronic Kidney Disease.    Anion gap 8 5 - 15    Current Facility-Administered Medications  Medication Dose Route Frequency Provider Last Rate Last Dose  . acetaminophen (TYLENOL) tablet 650 mg  650 mg Oral Q4H PRN Lacretia Leigh, MD      . alum & mag hydroxide-simeth (MAALOX/MYLANTA) 200-200-20 MG/5ML suspension 30 mL  30 mL Oral PRN Lacretia Leigh, MD      . diltiazem (CARDIZEM CD) 24 hr capsule 240 mg  240 mg Oral Daily Patrecia Pour, NP   240 mg at 03/20/16 0917  . diphenhydrAMINE (BENADRYL) injection 50 mg  50 mg Intramuscular Once Christeena Krogh, MD      . gabapentin (NEURONTIN) capsule 300 mg  300 mg Oral TID Patrecia Pour, NP   300 mg at 03/20/16 3382  . hydrOXYzine (ATARAX/VISTARIL) tablet 25 mg  25 mg Oral TID PRN Patrecia Pour, NP      . ibuprofen (ADVIL,MOTRIN) tablet 600 mg  600 mg Oral Q8H PRN Lacretia Leigh, MD      . OLANZapine zydis (ZYPREXA) disintegrating tablet 10 mg  10 mg Oral Q8H PRN Corena Pilgrim, MD   10 mg at 03/19/16 1044  . OLANZapine zydis (ZYPREXA) disintegrating tablet 5 mg  5 mg Oral BID PC Lurena Nida, NP   5 mg at 03/20/16 5053  . ondansetron (ZOFRAN) tablet 4 mg  4 mg Oral Q8H PRN Lacretia Leigh, MD      . pantoprazole (PROTONIX) EC tablet 40 mg  40 mg Oral Daily Patrecia Pour, NP   40 mg at 03/20/16 0917  . tamsulosin (FLOMAX) capsule 0.4 mg  0.4 mg Oral Daily Patrecia Pour, NP   0.4 mg at 03/20/16 0917  . traZODone (DESYREL) tablet 100 mg  100 mg Oral QHS PRN Patrecia Pour, NP   100 mg at 03/18/16 2118  . ziprasidone (GEODON) injection 20 mg  20 mg Intramuscular Once Corena Pilgrim, MD       Current Outpatient Prescriptions  Medication Sig Dispense Refill  . ibuprofen (ADVIL,MOTRIN) 200 MG tablet Take 1,000 mg by mouth every 4 (four) hours as  needed for moderate pain.    Marland Kitchen diltiazem (CARDIZEM CD) 240 MG 24 hr capsule Take 1 capsule (240 mg total) by mouth daily. (Patient not taking: Reported on 03/18/2016) 30 capsule 3  . gabapentin (NEURONTIN) 300 MG capsule Take 1 capsule (300 mg total) by  mouth 3 (three) times daily. (Patient not taking: Reported on 03/18/2016) 90 capsule 0  . hydrOXYzine (ATARAX/VISTARIL) 25 MG tablet Take 1 tablet (25 mg total) by mouth 3 (three) times daily as needed for anxiety. (Patient not taking: Reported on 03/18/2016) 30 tablet 0  . pantoprazole (PROTONIX) 40 MG tablet Take 1 tablet (40 mg total) by mouth daily. (Patient not taking: Reported on 03/18/2016)    . tamsulosin (FLOMAX) 0.4 MG CAPS capsule Take 1 capsule (0.4 mg total) by mouth daily. (Patient not taking: Reported on 03/18/2016) 30 capsule 0  . traZODone (DESYREL) 100 MG tablet Take 1 tablet (100 mg total) by mouth at bedtime as needed for sleep. (Patient not taking: Reported on 03/18/2016) 30 tablet 0    Musculoskeletal: Strength & Muscle Tone: within normal limits Gait & Station: normal Patient leans: N/A  Psychiatric Specialty Exam: Physical Exam  Constitutional: He is oriented to person, place, and time. He appears well-developed and well-nourished.  HENT:  Head: Normocephalic.  Neck: Normal range of motion.  Respiratory: Effort normal.  Musculoskeletal: Normal range of motion.  Neurological: He is alert and oriented to person, place, and time.  Skin: Skin is warm and dry.  Psychiatric: He has a normal mood and affect. His speech is normal and behavior is normal. Judgment and thought content normal. Cognition and memory are normal.    Review of Systems  Psychiatric/Behavioral: Positive for substance abuse.  All other systems reviewed and are negative.   Blood pressure 129/81, pulse 99, temperature 98.3 F (36.8 C), temperature source Oral, resp. rate 18, SpO2 98 %.There is no height or weight on file to calculate BMI.  General  Appearance: Casual  Eye Contact:  Good  Speech:  Normal Rate  Volume:  Normal  Mood:  Irritable  Affect:  Congruent  Thought Process:  Coherent and Descriptions of Associations: Intact  Orientation:  Full (Time, Place, and Person)  Thought Content:  Logical  Suicidal Thoughts:  No  Homicidal Thoughts:  No  Memory:  Immediate;   Good Recent;   Good Remote;   Good  Judgement:  Fair  Insight:  Fair  Psychomotor Activity:  Normal  Concentration:  Concentration: Good and Attention Span: Good  Recall:  Good  Fund of Knowledge:  Good  Language:  Good  Akathisia:  No  Handed:  Right  AIMS (if indicated):     Assets:  Leisure Time Physical Health Resilience Social Support  ADL's:  Intact  Cognition:  WNL  Sleep:        Treatment Plan Summary: Daily contact with patient to assess and evaluate symptoms and progress in treatment, Medication management and Plan cocaine abuse with cocaine induced mood disorder:  -Crisis stabilization -Medication management:  Restarted medical medications along with gabapentin 300 mg TID for cocaine withdrawal, Vistaril 25 mg TID PRN anxiety, and Zyprexa 5 mg BID for mood stabilization started. -Individual and substance abuse counseling  Disposition: No evidence of imminent risk to self or others at present.    Waylan Boga, NP 03/20/2016 10:36 AM  Patient seen face-to-face for psychiatric evaluation, chart reviewed and case discussed with the physician extender and developed treatment plan. Reviewed the information documented and agree with the treatment plan. Corena Pilgrim, MD

## 2016-03-20 NOTE — ED Notes (Signed)
Pt discharged home. Discharged instructions read to pt who verbalized understanding. All belongings returned to pt who signed for same. Denies SI/HI, is not delusional and not responding to internal stimuli. Escorted pt to the ED exit. Pt given bus ticket.   

## 2016-03-20 NOTE — Discharge Instructions (Signed)
For your ongoing behavioral health needs, you are advised to follow up with one of the following providers.  Contact them at your earliest opportunity to ask about scheduling an intake appointment:       [facility]      [address] [place]      [place], [place] [address]      [phone]      New patients are seen at the walk-in clinic every Tuesday from 9:00 am - 12:00 pm.       [facility]      [address]      [place], [place] [address]      [phone]      New patients are seen at their walk-in clinic.  Walk-in hours are Monday - Friday from 8:00 am - 12:00 pm, and from 1:00 pm - 3:00 pm.  Walk-in patients are seen on a first come, first served basis, so try to arrive as early as possible for the best chance of being seen the same day.  There is an initial fee of $22.50.       [facility]      [address] [place], [place] [address]      [phone]

## 2016-03-20 NOTE — BH Assessment (Signed)
[facility] Assessment Progress Note  Per Thedore Mins, MD, this pt does not require psychiatric hospitalization at this time.  Pt is to be discharged with outpatient behavioral health referrals. Discharge instructions advise pt to follow up with [facility], [facility] or [facility].  Pt's nurse, Diane, has been notified.  Doylene Canning, MA Triage Specialist [phone]

## 2016-03-22 NOTE — [facility] Suicide Risk Assessment (Signed)
Suicide Risk Assessment  Discharge Assessment   [facility] Discharge Suicide Risk Assessment   Principal Problem: Cocaine abuse with cocaine-induced mood disorder [facility]) Discharge Diagnoses:  Patient Active Problem List   Diagnosis Date Noted  . Cocaine abuse with cocaine-induced mood disorder [facility]) [F14.14] 03/20/2016    Priority: High  . Polysubstance dependence ([facility]) [F19.20] 03/21/2012    Priority: High  . Anxiety [F41.9]     Priority: High  . Bipolar affective disorder ([facility]) [F31.9]     Priority: High  . Polysubstance abuse [F19.10]     Priority: High  . Abnormal nuclear stress test [R94.39]   . Palpitations [R00.2] 11/26/2015  . Troponin level elevated [R74.8] 11/26/2015  . Homeless [Z59.0] 11/26/2015  . Essential hypertension [I10] 11/26/2015  . SVT (supraventricular tachycardia) (H[facility]) [I47.1]   . Pain in the chest [R07.9] 11/25/2015  . GERD (gastroesophageal reflux disease) [K21.9]   . Bipolar 1 disorder ([facility]) [F31.9]   . Back pain, chronic [M54.9, G89.29]   . Chronic kidney disease [N18.9]   . Affective psychosis, bipolar (H[facility]) [F31.9]   . Alcohol use disorder, severe, dependence ([facility]) [F10.20]   . Cocaine use disorder, severe, dependence ([facility]) [F14.20]   . Generalized abdominal pain [R10.84]   . Bipolar I disorder, most recent episode depressed ([facility]) [F31.30]   . Bipolar disorder, current episode depressed, severe, without psychotic features ([facility]) [F31.4]   . Bipolar affective disorder, depressed, severe ([facility]) [F31.4] 03/26/2014  . Right hip pain [M25.551]   . Medically noncompliant [Z91.19] 03/23/2014  . Dysuria [R30.0] 03/22/2013  . Headache [R51] 03/22/2013  . Gout [M10.9]   . Vitamin D deficiency [E55.9]   . Hepatitis C [B19.20]     Total Time spent with patient: 30 minutes  Musculoskeletal: Strength & Muscle Tone: within normal limits Gait & Station: normal Patient leans: N/A  Psychiatric Specialty Exam: Physical Exam  Constitutional: He is oriented to  person, place, and time. He appears well-developed and well-nourished.  HENT:  Head: Normocephalic.  Neck: Normal range of motion.  Respiratory: Effort normal.  Musculoskeletal: Normal range of motion.  Neurological: He is alert and oriented to person, place, and time.  Skin: Skin is warm and dry.  Psychiatric: He has a normal mood and affect. His speech is normal and behavior is normal. Judgment and thought content normal. Cognition and memory are normal.    Review of Systems  Psychiatric/Behavioral: Positive for substance abuse.  All other systems reviewed and are negative.   Blood pressure 129/81, pulse 99, temperature 98.3 F (36.8 C), temperature source Oral, resp. rate 18, SpO2 98 %.There is no height or weight on file to calculate BMI.  General Appearance: Casual  Eye Contact:  Good  Speech:  Normal Rate  Volume:  Normal  Mood:  Irritable  Affect:  Congruent  Thought Process:  Coherent and Descriptions of Associations: Intact  Orientation:  Full (Time, Place, and Person)  Thought Content:  Logical  Suicidal Thoughts:  No  Homicidal Thoughts:  No  Memory:  Immediate;   Good Recent;   Good Remote;   Good  Judgement:  Fair  Insight:  Fair  Psychomotor Activity:  Normal  Concentration:  Concentration: Good and Attention Span: Good  Recall:  Good  Fund of Knowledge:  Good  Language:  Good  Akathisia:  No  Handed:  Right  AIMS (if indicated):     Assets:  Leisure Time Physical Health Resilience Social Support  ADL's:  Intact  Cognition:  WNL  Sleep:  Mental Status Per Nursing Assessment::   On Admission:   cocaine abuse with suicidal ideations  Demographic Factors:  Male  Loss Factors: NA  Historical Factors: NA  Risk Reduction Factors:   Sense of responsibility to family, Positive social support and Positive therapeutic relationship  Continued Clinical Symptoms:  Irritability  Cognitive Features That Contribute To Risk:  None    Suicide  Risk:  Minimal: No identifiable suicidal ideation.  Patients presenting with no risk factors but with morbid ruminations; may be classified as minimal risk based on the severity of the depressive symptoms    Plan Of Care/Follow-up recommendations:  Activity:  as tolerated Diet:  heart healthy diet  Sherril Heyward, NP 03/22/2016, 8:43 AM

## 2016-03-23 DIAGNOSIS — F3164 Bipolar disorder, current episode mixed, severe, with psychotic features: Secondary | ICD-10-CM | POA: Diagnosis not present

## 2016-03-24 DIAGNOSIS — F3164 Bipolar disorder, current episode mixed, severe, with psychotic features: Secondary | ICD-10-CM | POA: Diagnosis not present

## 2016-03-25 DIAGNOSIS — F3164 Bipolar disorder, current episode mixed, severe, with psychotic features: Secondary | ICD-10-CM | POA: Diagnosis not present

## 2016-03-27 DIAGNOSIS — F3164 Bipolar disorder, current episode mixed, severe, with psychotic features: Secondary | ICD-10-CM | POA: Diagnosis not present

## 2016-03-28 DIAGNOSIS — F3164 Bipolar disorder, current episode mixed, severe, with psychotic features: Secondary | ICD-10-CM | POA: Diagnosis not present

## 2016-03-29 DIAGNOSIS — F3164 Bipolar disorder, current episode mixed, severe, with psychotic features: Secondary | ICD-10-CM | POA: Diagnosis not present

## 2016-04-01 DIAGNOSIS — R079 Chest pain, unspecified: Secondary | ICD-10-CM | POA: Diagnosis not present

## 2016-04-01 DIAGNOSIS — R0789 Other chest pain: Secondary | ICD-10-CM | POA: Diagnosis not present

## 2016-04-03 DIAGNOSIS — K219 Gastro-esophageal reflux disease without esophagitis: Secondary | ICD-10-CM | POA: Diagnosis not present

## 2016-04-03 DIAGNOSIS — R079 Chest pain, unspecified: Secondary | ICD-10-CM | POA: Diagnosis not present

## 2016-04-03 DIAGNOSIS — I517 Cardiomegaly: Secondary | ICD-10-CM | POA: Diagnosis not present

## 2016-04-03 DIAGNOSIS — R062 Wheezing: Secondary | ICD-10-CM | POA: Diagnosis not present

## 2016-04-03 DIAGNOSIS — I10 Essential (primary) hypertension: Secondary | ICD-10-CM | POA: Diagnosis not present

## 2016-04-04 DIAGNOSIS — R079 Chest pain, unspecified: Secondary | ICD-10-CM | POA: Diagnosis not present

## 2016-04-05 DIAGNOSIS — F331 Major depressive disorder, recurrent, moderate: Secondary | ICD-10-CM | POA: Diagnosis not present

## 2016-04-05 DIAGNOSIS — T405X5D Adverse effect of cocaine, subsequent encounter: Secondary | ICD-10-CM | POA: Diagnosis not present

## 2016-04-05 DIAGNOSIS — M19041 Primary osteoarthritis, right hand: Secondary | ICD-10-CM | POA: Diagnosis not present

## 2016-04-06 DIAGNOSIS — F331 Major depressive disorder, recurrent, moderate: Secondary | ICD-10-CM | POA: Diagnosis not present

## 2016-04-07 DIAGNOSIS — F331 Major depressive disorder, recurrent, moderate: Secondary | ICD-10-CM | POA: Diagnosis not present

## 2016-04-17 DIAGNOSIS — E119 Type 2 diabetes mellitus without complications: Secondary | ICD-10-CM | POA: Diagnosis not present

## 2016-04-17 DIAGNOSIS — I4949 Other premature depolarization: Secondary | ICD-10-CM | POA: Diagnosis not present

## 2016-04-17 DIAGNOSIS — R0789 Other chest pain: Secondary | ICD-10-CM | POA: Diagnosis not present

## 2016-04-17 DIAGNOSIS — E876 Hypokalemia: Secondary | ICD-10-CM | POA: Diagnosis not present

## 2016-04-17 DIAGNOSIS — R079 Chest pain, unspecified: Secondary | ICD-10-CM | POA: Diagnosis not present

## 2016-04-17 DIAGNOSIS — B1710 Acute hepatitis C without hepatic coma: Secondary | ICD-10-CM | POA: Diagnosis not present

## 2016-04-17 DIAGNOSIS — I442 Atrioventricular block, complete: Secondary | ICD-10-CM | POA: Insufficient documentation

## 2016-04-17 DIAGNOSIS — F17210 Nicotine dependence, cigarettes, uncomplicated: Secondary | ICD-10-CM | POA: Diagnosis not present

## 2016-04-17 DIAGNOSIS — I119 Hypertensive heart disease without heart failure: Secondary | ICD-10-CM | POA: Diagnosis not present

## 2016-04-17 DIAGNOSIS — F1120 Opioid dependence, uncomplicated: Secondary | ICD-10-CM | POA: Insufficient documentation

## 2016-04-18 DIAGNOSIS — I440 Atrioventricular block, first degree: Secondary | ICD-10-CM | POA: Diagnosis not present

## 2016-04-18 DIAGNOSIS — I442 Atrioventricular block, complete: Secondary | ICD-10-CM | POA: Diagnosis not present

## 2016-04-19 DIAGNOSIS — R0789 Other chest pain: Secondary | ICD-10-CM | POA: Diagnosis not present

## 2016-04-20 DIAGNOSIS — R9431 Abnormal electrocardiogram [ECG] [EKG]: Secondary | ICD-10-CM | POA: Diagnosis not present

## 2016-04-21 DIAGNOSIS — R079 Chest pain, unspecified: Secondary | ICD-10-CM | POA: Diagnosis not present

## 2016-05-05 DIAGNOSIS — F17210 Nicotine dependence, cigarettes, uncomplicated: Secondary | ICD-10-CM | POA: Diagnosis not present

## 2016-05-05 DIAGNOSIS — B1920 Unspecified viral hepatitis C without hepatic coma: Secondary | ICD-10-CM | POA: Diagnosis not present

## 2016-05-05 DIAGNOSIS — I10 Essential (primary) hypertension: Secondary | ICD-10-CM | POA: Diagnosis not present

## 2016-05-05 DIAGNOSIS — L02412 Cutaneous abscess of left axilla: Secondary | ICD-10-CM | POA: Diagnosis not present

## 2016-05-05 DIAGNOSIS — R112 Nausea with vomiting, unspecified: Secondary | ICD-10-CM | POA: Diagnosis not present

## 2016-05-05 DIAGNOSIS — F4310 Post-traumatic stress disorder, unspecified: Secondary | ICD-10-CM | POA: Diagnosis not present

## 2016-05-05 DIAGNOSIS — M79602 Pain in left arm: Secondary | ICD-10-CM | POA: Diagnosis not present

## 2016-05-05 DIAGNOSIS — F319 Bipolar disorder, unspecified: Secondary | ICD-10-CM | POA: Diagnosis not present

## 2016-05-18 ENCOUNTER — Emergency Department (HOSPITAL_COMMUNITY): Payer: Medicare Other

## 2016-05-18 ENCOUNTER — Emergency Department (HOSPITAL_COMMUNITY)
Admission: EM | Admit: 2016-05-18 | Discharge: 2016-05-23 | Disposition: A | Discharge status: Home / Self Care | Payer: Medicare Other | Attending: Emergency Medicine | Admitting: Emergency Medicine

## 2016-05-18 ENCOUNTER — Encounter (HOSPITAL_COMMUNITY): Payer: Self-pay

## 2016-05-18 DIAGNOSIS — R072 Precordial pain: Secondary | ICD-10-CM | POA: Diagnosis present

## 2016-05-18 DIAGNOSIS — F17200 Nicotine dependence, unspecified, uncomplicated: Secondary | ICD-10-CM | POA: Insufficient documentation

## 2016-05-18 DIAGNOSIS — R509 Fever, unspecified: Secondary | ICD-10-CM | POA: Diagnosis not present

## 2016-05-18 DIAGNOSIS — I252 Old myocardial infarction: Secondary | ICD-10-CM | POA: Diagnosis not present

## 2016-05-18 DIAGNOSIS — F329 Major depressive disorder, single episode, unspecified: Secondary | ICD-10-CM | POA: Diagnosis not present

## 2016-05-18 DIAGNOSIS — N189 Chronic kidney disease, unspecified: Secondary | ICD-10-CM | POA: Insufficient documentation

## 2016-05-18 DIAGNOSIS — R079 Chest pain, unspecified: Secondary | ICD-10-CM | POA: Diagnosis not present

## 2016-05-18 DIAGNOSIS — I129 Hypertensive chronic kidney disease with stage 1 through stage 4 chronic kidney disease, or unspecified chronic kidney disease: Secondary | ICD-10-CM | POA: Diagnosis not present

## 2016-05-18 DIAGNOSIS — R1111 Vomiting without nausea: Secondary | ICD-10-CM | POA: Diagnosis not present

## 2016-05-18 DIAGNOSIS — F314 Bipolar disorder, current episode depressed, severe, without psychotic features: Secondary | ICD-10-CM | POA: Diagnosis present

## 2016-05-18 DIAGNOSIS — F1414 Cocaine abuse with cocaine-induced mood disorder: Secondary | ICD-10-CM | POA: Diagnosis present

## 2016-05-18 DIAGNOSIS — F1910 Other psychoactive substance abuse, uncomplicated: Secondary | ICD-10-CM | POA: Diagnosis not present

## 2016-05-18 DIAGNOSIS — F32A Depression, unspecified: Secondary | ICD-10-CM

## 2016-05-18 DIAGNOSIS — F1490 Cocaine use, unspecified, uncomplicated: Secondary | ICD-10-CM | POA: Diagnosis not present

## 2016-05-18 LAB — CBC WITH DIFFERENTIAL/PLATELET
Basophils Absolute: 0 10*3/uL (ref 0.0–0.1)
Basophils Relative: 0 %
Eosinophils Absolute: 0.1 10*3/uL (ref 0.0–0.7)
Eosinophils Relative: 1 %
HCT: 40.9 % (ref 39.0–52.0)
Hemoglobin: 13.7 g/dL (ref 13.0–17.0)
Lymphocytes Relative: 23 %
Lymphs Abs: 1.8 10*3/uL (ref 0.7–4.0)
MCH: 28.2 pg (ref 26.0–34.0)
MCHC: 33.5 g/dL (ref 30.0–36.0)
MCV: 84.2 fL (ref 78.0–100.0)
Monocytes Absolute: 0.5 10*3/uL (ref 0.1–1.0)
Monocytes Relative: 7 %
Neutro Abs: 5.3 10*3/uL (ref 1.7–7.7)
Neutrophils Relative %: 69 %
Platelets: 265 10*3/uL (ref 150–400)
RBC: 4.86 MIL/uL (ref 4.22–5.81)
RDW: 14.7 % (ref 11.5–15.5)
WBC: 7.7 10*3/uL (ref 4.0–10.5)

## 2016-05-18 LAB — COMPREHENSIVE METABOLIC PANEL
ALT: 60 U/L (ref 17–63)
AST: 49 U/L — ABNORMAL HIGH (ref 15–41)
Albumin: 3.9 g/dL (ref 3.5–5.0)
Alkaline Phosphatase: 44 U/L (ref 38–126)
Anion gap: 11 (ref 5–15)
BUN: 9 mg/dL (ref 6–20)
CO2: 25 mmol/L (ref 22–32)
Calcium: 9.5 mg/dL (ref 8.9–10.3)
Chloride: 104 mmol/L (ref 101–111)
Creatinine, Ser: 0.92 mg/dL (ref 0.61–1.24)
GFR calc Af Amer: >60 mL/min (ref >60)
GFR calc non Af Amer: >60 mL/min (ref >60)
Glucose, Bld: 86 mg/dL (ref 65–99)
Potassium: 3.4 mmol/L — ABNORMAL LOW (ref 3.5–5.1)
Sodium: 140 mmol/L (ref 135–145)
Total Bilirubin: 0.6 mg/dL (ref 0.3–1.2)
Total Protein: 7.2 g/dL (ref 6.5–8.1)

## 2016-05-18 LAB — D-DIMER, QUANTITATIVE: D-Dimer, Quant: <0.27 ug/mL-FEU (ref 0.00–0.50)

## 2016-05-18 LAB — I-STAT TROPONIN, ED: Troponin i, poc: 0 ng/mL (ref 0.00–0.08)

## 2016-05-18 LAB — LIPASE, BLOOD: Lipase: 21 U/L (ref 11–51)

## 2016-05-18 MED ORDER — TRAMADOL HCL 50 MG PO TABS
50.0000 mg | ORAL_TABLET | Freq: Once | ORAL | Status: AC
  Administered 2016-05-18: 50 mg via ORAL
  Filled 2016-05-18: qty 1

## 2016-05-18 MED ORDER — IBUPROFEN 200 MG PO TABS
600.0000 mg | ORAL_TABLET | Freq: Four times a day (QID) | ORAL | Status: DC | PRN
  Administered 2016-05-19 – 2016-05-21 (×2): 600 mg via ORAL
  Filled 2016-05-18 (×2): qty 1

## 2016-05-18 MED ORDER — DILTIAZEM HCL ER COATED BEADS 120 MG PO CP24
240.0000 mg | ORAL_CAPSULE | Freq: Every day | ORAL | Status: DC
  Administered 2016-05-19 – 2016-05-23 (×5): 240 mg via ORAL
  Filled 2016-05-18 (×5): qty 2

## 2016-05-18 MED ORDER — TOPIRAMATE 25 MG PO TABS
25.0000 mg | ORAL_TABLET | Freq: Two times a day (BID) | ORAL | Status: DC
  Administered 2016-05-18 – 2016-05-23 (×10): 25 mg via ORAL
  Filled 2016-05-18 (×11): qty 1

## 2016-05-18 MED ORDER — SERTRALINE HCL 50 MG PO TABS
100.0000 mg | ORAL_TABLET | Freq: Every day | ORAL | Status: DC
  Administered 2016-05-18 – 2016-05-23 (×6): 100 mg via ORAL
  Filled 2016-05-18: qty 2
  Filled 2016-05-18: qty 1
  Filled 2016-05-18 (×4): qty 2

## 2016-05-18 MED ORDER — ASPIRIN 81 MG PO CHEW
324.0000 mg | CHEWABLE_TABLET | Freq: Once | ORAL | Status: AC
  Administered 2016-05-18: 324 mg via ORAL
  Filled 2016-05-18: qty 4

## 2016-05-18 MED ORDER — TAMSULOSIN HCL 0.4 MG PO CAPS
0.4000 mg | ORAL_CAPSULE | Freq: Every day | ORAL | Status: DC
  Administered 2016-05-19 – 2016-05-23 (×5): 0.4 mg via ORAL
  Filled 2016-05-18 (×5): qty 1

## 2016-05-18 MED ORDER — GABAPENTIN 300 MG PO CAPS
300.0000 mg | ORAL_CAPSULE | Freq: Three times a day (TID) | ORAL | Status: DC
  Administered 2016-05-18 – 2016-05-23 (×14): 300 mg via ORAL
  Filled 2016-05-18 (×14): qty 1

## 2016-05-18 MED ORDER — GI COCKTAIL ~~LOC~~
30.0000 mL | Freq: Once | ORAL | Status: AC
  Administered 2016-05-18: 30 mL via ORAL
  Filled 2016-05-18: qty 30

## 2016-05-18 MED ORDER — PANTOPRAZOLE SODIUM 40 MG PO TBEC
40.0000 mg | DELAYED_RELEASE_TABLET | Freq: Every day | ORAL | Status: DC
  Administered 2016-05-18 – 2016-05-23 (×6): 40 mg via ORAL
  Filled 2016-05-18 (×7): qty 1

## 2016-05-18 MED ORDER — HYDROXYZINE HCL 25 MG PO TABS
25.0000 mg | ORAL_TABLET | Freq: Three times a day (TID) | ORAL | Status: DC | PRN
Start: 2016-05-18 — End: 2016-05-23
  Administered 2016-05-19 – 2016-05-22 (×2): 25 mg via ORAL
  Filled 2016-05-18 (×2): qty 1

## 2016-05-18 MED ORDER — TRAZODONE HCL 100 MG PO TABS
100.0000 mg | ORAL_TABLET | Freq: Every evening | ORAL | Status: DC | PRN
  Administered 2016-05-22: 100 mg via ORAL
  Filled 2016-05-18: qty 1

## 2016-05-18 NOTE — ED Notes (Signed)
When pt. Notified that he may be d/c he is asking for mental health resources. Pt. Also states he just needs a ride to the [facility] hospital for mental health. Social work at bedside.

## 2016-05-18 NOTE — Progress Notes (Signed)
CSW spoke briefly to pt re: homelessness.  Per pt, he had been living with friends and had been recently asked to leave.  He has no where to go and is feeling depressed.  Pt endorses SI.  MD notified.

## 2016-05-18 NOTE — ED Provider Notes (Signed)
Emergency Department Provider Note   I have reviewed the triage vital signs and the nursing notes.   HISTORY  Chief Complaint Chest Pain (Pt with cough, chest pain and general malaise) and Diarrhea (in the past 24 hours has been having Nausea/Vomitting and Diarrhea )   HPI [name] is a 56 y.o. male with PMH of polysubstance abuse, bipolar disorder, GERD, HepC, and HTN presents to the emergency department for evaluation of URI symptoms including cough and CP for the last 4 days. The patient describes some associated nausea, vomiting, diarrhea. His chest pain is substernal and feels dull. Made worse with talking. No worsening pain with exertion or deep breathing. No position changes. Pain also made worse with palpation of the anterior chest. No sick contacts. No fevers. Pain does not radiate.    Past Medical History:  Diagnosis Date  . Alcohol abuse   . Anxiety   . Arthritis    "all over" (11/25/2015)  . Bipolar 1 disorder (HCC)   . Bipolar affective disorder (HCC)   . Chronic back pain   . Chronic kidney disease   . Cocaine abuse    smokes crack-cocaine  . Depression   . GERD (gastroesophageal reflux disease)   . Gout   . Headache    "q couple days; stress, tension, anger" (11/25/2015)  . Hemorrhoids   . Hepatitis C    "not yet treated" (11/25/2015)  . Hypertension   . Insomnia   . Myocardial infarction 11/2015   "supposedly" (11/25/2015)  . OSA on CPAP    "had mask; it was stolen" (11/25/2015)  . PTSD (post-traumatic stress disorder)   . Tobacco abuse   . Vitamin D deficiency     Patient Active Problem List   Diagnosis Date Noted  . Cocaine abuse with cocaine-induced mood disorder (HCC) 03/20/2016  . Abnormal nuclear stress test   . Palpitations 11/26/2015  . Troponin level elevated 11/26/2015  . Homeless 11/26/2015  . Essential hypertension 11/26/2015  . SVT (supraventricular tachycardia) (HCC)   . Pain in the chest 11/25/2015  . GERD (gastroesophageal  reflux disease)   . Bipolar 1 disorder (HCC)   . Back pain, chronic   . Chronic kidney disease   . Affective psychosis, bipolar (HCC)   . Alcohol use disorder, severe, dependence (HCC)   . Cocaine use disorder, severe, dependence (HCC)   . Generalized abdominal pain   . Bipolar I disorder, most recent episode depressed (HCC)   . Bipolar disorder, current episode depressed, severe, without psychotic features (HCC)   . Bipolar affective disorder, depressed, severe (HCC) 03/26/2014  . Right hip pain   . Medically noncompliant 03/23/2014  . Dysuria 03/22/2013  . Headache 03/22/2013  . Polysubstance dependence (HCC) 03/21/2012  . Anxiety   . Bipolar affective disorder (HCC)   . Polysubstance abuse   . Gout   . Vitamin D deficiency   . Hepatitis C     Past Surgical History:  Procedure Laterality Date  . CARDIAC CATHETERIZATION N/A 11/29/2015   Procedure: Left Heart Cath and Coronary Angiography;  Surgeon: Laurey Morale, MD;  Location: [facility] INVASIVE CV LAB;  Service: Cardiovascular;  Laterality: N/A;  . FRACTURE SURGERY    . PATELLA FRACTURE SURGERY Left ~ 1995  . TONSILLECTOMY      Current Outpatient Rx  . Order #: [id number] Class: Normal  . Order #: [id number] Class: Print  . Order #: [id number] Class: Historical Med  . Order #: [id number] Class: No Print  . Order #:  [id number] Class: Historical Med  . Order #: [id number] Class: Print  . Order #: [id number] Class: Historical Med  . Order #: [id number] Class: No Print  . Order #: [id number] Class: Print    Allergies Lactose intolerance (gi)  Family History  Problem Relation Age of Onset  . Hypertension Brother   . Diabetes Brother   . Hypertension Mother   . Diabetes Mother   . Arthritis Mother   . Other Sister     bone disease    Social History Social History  Substance Use Topics  . Smoking status: Current Every Day Smoker    Packs/day: 0.12    Years: 46.00  . Smokeless tobacco: Never Used  . Alcohol use Yes     Comment:  11/25/2015 "quit alcohol 6 months ago; used to drink from morning til night"    Review of Systems  Constitutional: No fever/chills Eyes: No visual changes. ENT: No sore throat. Cardiovascular: Positive chest pain with coughing x 4 days.   Respiratory: Denies shortness of breath. Positive coughing.  Gastrointestinal: No abdominal pain. Positive nausea, vomiting, and diarrhea.  No constipation. Genitourinary: Negative for dysuria. Musculoskeletal: Negative for back pain. Skin: Negative for rash. Neurological: Negative for headaches, focal weakness or numbness.  10-point ROS otherwise negative.  ____________________________________________   PHYSICAL EXAM:  VITAL SIGNS: ED Triage Vitals  Enc Vitals Group     BP 05/18/16 1450 112/81     Pulse Rate 05/18/16 1450 78     Resp 05/18/16 1450 (!) 27     Temp 05/18/16 1450 98.7 F (37.1 C)     Temp Source 05/18/16 1450 Oral     SpO2 05/18/16 1450 99 %     Weight 05/18/16 1448 230 lb (104.3 kg)     Height 05/18/16 1448 5\' 7"  (1.702 m)     Pain Score 05/18/16 1448 8   Constitutional: Alert and oriented. Well appearing and in no acute distress. Coughing frequently.  Eyes: Conjunctivae are normal.  Head: Atraumatic. Nose: No congestion/rhinnorhea. Mouth/Throat: Mucous membranes are moist.   Neck: No stridor.  Cardiovascular: Normal rate, regular rhythm. Good peripheral circulation. Grossly normal heart sounds.   Respiratory: Normal respiratory effort.  No retractions. Lungs CTAB. Gastrointestinal: Soft and nontender. No distention.  Musculoskeletal: No lower extremity tenderness nor edema. No gross deformities of extremities. Positive tenderness to palpation of the anterior chest wall.  Neurologic:  Normal speech and language. No gross focal neurologic deficits are appreciated.  Skin:  Skin is warm, dry and intact. No rash noted. Psychiatric: Mood and affect are normal. Speech and behavior are  normal.  ____________________________________________   LABS (all labs ordered are listed, but only abnormal results are displayed)  Labs Reviewed  COMPREHENSIVE METABOLIC PANEL - Abnormal; Notable for the following:       Result Value   Potassium 3.4 (*)    AST 49 (*)    All other components within normal limits  LIPASE, BLOOD  CBC WITH DIFFERENTIAL/PLATELET  D-DIMER, QUANTITATIVE (NOT AT [facility])  I-STAT TROPOININ, ED   ____________________________________________  EKG   EKG Interpretation  Date/Time:  Thursday May 18 2016 14:49:21 EST Ventricular Rate:  83 PR Interval:    QRS Duration: 94 QT Interval:  411 QTC Calculation: 478 R Axis:   50 Text Interpretation:  Sinus rhythm Atrial premature complex Abnormal R-wave progression, early transition Borderline prolonged QT interval No STEMI. Similar to prior.  Confirmed by Raylea Adcox MD, Aliyana Dlugosz [phone]) on 05/18/2016 3:01:05 PM       ____________________________________________  RADIOLOGY  Dg Chest 2 View  Result Date: 05/18/2016 CLINICAL DATA:  Central chest pain, dyspnea, fever, nausea and vomiting with diarrhea x4 days. Patient is a smoker. EXAM: CHEST  2 VIEW COMPARISON:  04/27/2016 CXR FINDINGS: Borderline cardiomegaly. No aortic aneurysm. Mild bronchitic change noted bilaterally possibly related to patient's history of smoking. The visualized skeletal structures are unremarkable. IMPRESSION: Mild increase in interstitial prominence which may reflect chronic bronchitic change. No alveolar consolidation or CHF. No effusion or pneumothorax. Electronically Signed   By: Tollie Eth M.D.   On: 05/18/2016 16:18    ____________________________________________   PROCEDURES  Procedure(s) performed:   Procedures  None ____________________________________________   INITIAL IMPRESSION / ASSESSMENT AND PLAN / ED COURSE  Pertinent labs & imaging results that were available during my care of the patient were reviewed by me and  considered in my medical decision making (see chart for details).  Patient resents to the emergency department for evaluation of cough, chest pain, palpitations for the past 4 days. He never had any period of being chest pain-free. He describes symptoms as more of a congestion and worse with talking. He has some associated nausea, vomiting, diarrhea. Patient is afebrile here with only mild tachypnea. He is complaining of current chest pain. EKG looks similar to prior tracings. Plan for troponin, labs, chest x-ray.   Differential includes all life-threatening causes for chest pain. This includes but is not exclusive to acute coronary syndrome, aortic dissection, pulmonary embolism, cardiac tamponade, community-acquired pneumonia, pericarditis, musculoskeletal chest wall pain, etc.   D-dimer negative. Patient endorses feeling worsening depression and suicidal think. Reports that he is considering jumping in front of traffic and has been off of medication. Place psychiatry consult. Patient is medically clear at this time.  ____________________________________________  FINAL CLINICAL IMPRESSION(S) / ED DIAGNOSES  Final diagnoses:  Chest pain, unspecified type  Depression, unspecified depression type     MEDICATIONS GIVEN DURING THIS VISIT:  Medications  diltiazem (CARDIZEM CD) 24 hr capsule 240 mg (240 mg Oral Given 05/19/16 0918)  gabapentin (NEURONTIN) capsule 300 mg (300 mg Oral Given 05/19/16 0918)  hydrOXYzine (ATARAX/VISTARIL) tablet 25 mg (not administered)  ibuprofen (ADVIL,MOTRIN) tablet 600 mg (not administered)  pantoprazole (PROTONIX) EC tablet 40 mg (40 mg Oral Given 05/19/16 0934)  sertraline (ZOLOFT) tablet 100 mg (100 mg Oral Given 05/19/16 0918)  tamsulosin (FLOMAX) capsule 0.4 mg (0.4 mg Oral Given 05/19/16 1015)  topiramate (TOPAMAX) tablet 25 mg (25 mg Oral Given 05/19/16 0918)  traZODone (DESYREL) tablet 100 mg (not administered)  aspirin chewable tablet 324 mg (324 mg Oral Given  05/18/16 1534)  gi cocktail (Maalox,Lidocaine,Donnatal) (30 mLs Oral Given 05/18/16 1533)  traMADol (ULTRAM) tablet 50 mg (50 mg Oral Given 05/18/16 1855)     NEW OUTPATIENT MEDICATIONS STARTED DURING THIS VISIT:  None   Note:  This document was prepared using Dragon voice recognition software and may include unintentional dictation errors.  Alona Bene, MD Emergency Medicine   Maia Plan, MD 05/19/16 [phone]

## 2016-05-18 NOTE — ED Triage Notes (Signed)
Pt arrives with N/V/D that started last night has been having a cough and chest pain with palpation for the past four days

## 2016-05-18 NOTE — ED Notes (Signed)
ED Provider at bedside. 

## 2016-05-19 DIAGNOSIS — F1490 Cocaine use, unspecified, uncomplicated: Secondary | ICD-10-CM | POA: Diagnosis not present

## 2016-05-19 DIAGNOSIS — Z91011 Allergy to milk products: Secondary | ICD-10-CM | POA: Diagnosis not present

## 2016-05-19 DIAGNOSIS — F329 Major depressive disorder, single episode, unspecified: Secondary | ICD-10-CM | POA: Diagnosis not present

## 2016-05-19 DIAGNOSIS — R45851 Suicidal ideations: Secondary | ICD-10-CM | POA: Diagnosis not present

## 2016-05-19 DIAGNOSIS — R079 Chest pain, unspecified: Secondary | ICD-10-CM

## 2016-05-19 DIAGNOSIS — F1910 Other psychoactive substance abuse, uncomplicated: Secondary | ICD-10-CM | POA: Diagnosis not present

## 2016-05-19 DIAGNOSIS — F17210 Nicotine dependence, cigarettes, uncomplicated: Secondary | ICD-10-CM | POA: Diagnosis not present

## 2016-05-19 DIAGNOSIS — F1290 Cannabis use, unspecified, uncomplicated: Secondary | ICD-10-CM | POA: Diagnosis not present
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
## 2016-05-19 NOTE — ED Notes (Signed)
tts at bedside 

## 2016-05-19 NOTE — ED Notes (Signed)
Dinner order placed 

## 2016-05-19 NOTE — Progress Notes (Signed)
CSW engaged with Patient at his bedside to further discuss homelessness issues. Patient reports that he does receive disability income and could rent a room at a boarding home. CSW informed Patient of boarding home options in [facility] and provided Patient's RN with housing resources to be included with Patient's AVS. Patient denies any further questions or concerns at this time.   Enos Fling, MSW, LCSW [facility] ED/71M Clinical Social Worker [phone]

## 2016-05-19 NOTE — ED Notes (Signed)
Snacks given 

## 2016-05-19 NOTE — ED Notes (Signed)
Lunch order placed
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
## 2016-05-20 DIAGNOSIS — F329 Major depressive disorder, single episode, unspecified: Secondary | ICD-10-CM | POA: Diagnosis not present

## 2016-05-20 LAB — POTASSIUM: Potassium: 3.3 mmol/L — ABNORMAL LOW (ref 3.5–5.1)

## 2016-05-20 LAB — ETHANOL: Alcohol, Ethyl (B): <5 mg/dL (ref <5)

## 2016-05-20 MED ORDER — POTASSIUM CHLORIDE CRYS ER 20 MEQ PO TBCR
20.0000 meq | EXTENDED_RELEASE_TABLET | Freq: Two times a day (BID) | ORAL | Status: AC
  Administered 2016-05-20 – 2016-05-22 (×6): 20 meq via ORAL
  Filled 2016-05-20 (×6): qty 1

## 2016-05-20 NOTE — ED Notes (Signed)
Left VM for SW.

## 2016-05-20 NOTE — ED Notes (Signed)
Woke pt and advised dinner tray on bedside table - pt acknowledged and then returned to sleeping.

## 2016-05-20 NOTE — ED Notes (Signed)
Pt woke briefly to take meds then returned to sleeping. 

## 2016-05-20 NOTE — ED Notes (Addendum)
Lunch order placed, and snack given. 

## 2016-05-20 NOTE — ED Notes (Signed)
Dinner order placed 

## 2016-05-20 NOTE — [facility] Counselor (Signed)
Reassessment Note: Pt continues to endorse depression, suicidal ideations with a plan to overdose of jump out in front of traffic. He denies AVH and HI at this time. He states that he barely slept last night and has a headache this morning. Pt affect is flat and depressed with lethargic speech. He states that he "needs help" and wants to go inpatient. Pt continues to meet criteria for inpatient admission.   [address] [place], [address]

## 2016-05-20 NOTE — ED Notes (Signed)
Telepsych being performed. 

## 2016-05-20 NOTE — ED Notes (Signed)
Pt woke for labs to be drawn then returned to sleeping.

## 2016-05-20 NOTE — ED Notes (Signed)
Woke pt so may eat lunch and take Potassium.

## 2016-05-20 NOTE — ED Notes (Signed)
Offered for pt to shower - declined at this time.

## 2016-05-20 NOTE — ED Notes (Signed)
Snack given to pt.

## 2016-05-21 DIAGNOSIS — F329 Major depressive disorder, single episode, unspecified: Secondary | ICD-10-CM | POA: Diagnosis not present

## 2016-05-21 NOTE — ED Notes (Signed)
Pt given snack; lunch ordered 

## 2016-05-21 NOTE — ED Notes (Signed)
Sitting on bed watching tv.  

## 2016-05-21 NOTE — ED Notes (Addendum)
STATES "JUST TIRED OF LIVING" - DENIES SUICIDAL PLAN - DENIES HI. Offered pt to shower - states will later.

## 2016-05-21 NOTE — ED Notes (Signed)
TTS being performed.  

## 2016-05-21 NOTE — [facility] Counselor (Signed)
Pt reassessed.  Began by asking Pt about his statement that he is no longer suicidal.  Pt denied, stating that he feels depressed and wants to kill himself "by running into traffic."  Pt reported also that he "just wants to get to the [name] in [place]."  What would happen if Pt left the hospital?  Not sure.  Pt stated taht he continues to be suicidal.  Per staff this AM, Pt stated that he was no longer suicidal.  Pt is homeless and may be potential secondary by remaining in ED.  Because Pt insists that he is suicidal, Thereasa Parkin recommends continued inpatient.

## 2016-05-22 DIAGNOSIS — R4020 Unspecified coma: Secondary | ICD-10-CM | POA: Diagnosis not present

## 2016-05-22 DIAGNOSIS — R55 Syncope and collapse: Secondary | ICD-10-CM | POA: Diagnosis not present

## 2016-05-22 DIAGNOSIS — F1099 Alcohol use, unspecified with unspecified alcohol-induced disorder: Secondary | ICD-10-CM | POA: Diagnosis not present

## 2016-05-22 DIAGNOSIS — Z9889 Other specified postprocedural states: Secondary | ICD-10-CM

## 2016-05-22 DIAGNOSIS — F1414 Cocaine abuse with cocaine-induced mood disorder: Secondary | ICD-10-CM | POA: Diagnosis not present

## 2016-05-22 DIAGNOSIS — F17210 Nicotine dependence, cigarettes, uncomplicated: Secondary | ICD-10-CM | POA: Diagnosis not present

## 2016-05-22 DIAGNOSIS — R9439 Abnormal result of other cardiovascular function study: Secondary | ICD-10-CM

## 2016-05-22 DIAGNOSIS — Z8261 Family history of arthritis: Secondary | ICD-10-CM

## 2016-05-22 DIAGNOSIS — Z833 Family history of diabetes mellitus: Secondary | ICD-10-CM

## 2016-05-22 DIAGNOSIS — Z8249 Family history of ischemic heart disease and other diseases of the circulatory system: Secondary | ICD-10-CM | POA: Diagnosis not present

## 2016-05-22 DIAGNOSIS — R0602 Shortness of breath: Secondary | ICD-10-CM | POA: Diagnosis not present

## 2016-05-22 DIAGNOSIS — E162 Hypoglycemia, unspecified: Secondary | ICD-10-CM | POA: Diagnosis not present

## 2016-05-22 DIAGNOSIS — Z91011 Allergy to milk products: Secondary | ICD-10-CM | POA: Diagnosis not present

## 2016-05-22 DIAGNOSIS — F3130 Bipolar disorder, current episode depressed, mild or moderate severity, unspecified: Secondary | ICD-10-CM | POA: Diagnosis not present

## 2016-05-22 DIAGNOSIS — F1910 Other psychoactive substance abuse, uncomplicated: Secondary | ICD-10-CM | POA: Diagnosis not present

## 2016-05-22 DIAGNOSIS — G9340 Encephalopathy, unspecified: Secondary | ICD-10-CM | POA: Diagnosis not present

## 2016-05-22 DIAGNOSIS — Z4682 Encounter for fitting and adjustment of non-vascular catheter: Secondary | ICD-10-CM | POA: Diagnosis not present

## 2016-05-22 DIAGNOSIS — Z79899 Other long term (current) drug therapy: Secondary | ICD-10-CM | POA: Diagnosis not present

## 2016-05-22 DIAGNOSIS — F1290 Cannabis use, unspecified, uncomplicated: Secondary | ICD-10-CM | POA: Diagnosis not present

## 2016-05-22 NOTE — Consult Note (Signed)
Telepsych Consultation   Reason for Consult:  Suicidal ideation Referring Physician:  EDP Patient Identification: CARDERO QUIZHPI MRN:  [id number] Principal Diagnosis: Cocaine abuse with cocaine-induced mood disorder [facility]) Diagnosis:   Patient Active Problem List   Diagnosis Date Noted  . Cocaine abuse with cocaine-induced mood disorder (HCC) [F14.14] 03/20/2016  . Abnormal nuclear stress test [R94.39]   . Palpitations [R00.2] 11/26/2015  . Troponin level elevated [R74.8] 11/26/2015  . Homeless [Z59.0] 11/26/2015  . Essential hypertension [I10] 11/26/2015  . SVT (supraventricular tachycardia) (HCC) [I47.1]   . Pain in the chest [R07.9] 11/25/2015  . GERD (gastroesophageal reflux disease) [K21.9]   . Bipolar 1 disorder (HCC) [F31.9]   . Back pain, chronic [M54.9, G89.29]   . Chronic kidney disease [N18.9]   . Affective psychosis, bipolar (HCC) [F31.9]   . Alcohol use disorder, severe, dependence (HCC) [F10.20]   . Cocaine use disorder, severe, dependence (HCC) [F14.20]   . Generalized abdominal pain [R10.84]   . Bipolar I disorder, most recent episode depressed (HCC) [F31.30]   . Bipolar disorder, current episode depressed, severe, without psychotic features (HCC) [F31.4]   . Bipolar affective disorder, depressed, severe (HCC) [F31.4] 03/26/2014  . Right hip pain [M25.551]   . Medically noncompliant [Z91.19] 03/23/2014  . Dysuria [R30.0] 03/22/2013  . Headache [R51] 03/22/2013  . Polysubstance dependence (HCC) [F19.20] 03/21/2012  . Chest pain [R07.9] 02/23/2012  . Anxiety [F41.9]   . Bipolar affective disorder (HCC) [F31.9]   . Depression [F32.9]   . Polysubstance abuse [F19.10]   . Gout [M10.9]   . Vitamin D deficiency [E55.9]   . Hepatitis C [B19.20]     Total Time spent with patient: 30 minutes  Subjective:   [name] is a 56 y.o. male patient admitted with suicidal ideations, MDD.  HPI:  Per chart review, [name] is an 56 y.o. male who came into the [facility]  tonight complaining of medical symptoms including N/V/D and chest pain. Once medically cleared and about to be discharged, stated he was depressed and suicidal and needs MH assistance, having SI with a plan to walk into traffic.  Patient is well known to [facility], his last admission was on 01/2015 with similar presentation, N/V/D/chest pain, once cleared, developed suicidal ideations.  Has attempted suicide in the past.  Has received numerous treatment at [facility] and also [facility].  He states that he just needs to be able to get help, "I need it ma'am, maybe you can send me to the [name], I'm a veteran."  He states that he is a harm to himself at the moment.  It is apparent that patient has no place to go, homeless.    Upon admission, he states he was hearing voices of dead family members, he is denying them today.     Psych Hx:  Per pt record, pt has previous diagnoses of Bipolar I D/O, Polysubstance abuse, Anxiety and PTSD. Pt also has multiple medical conditions including hx of MI, Hep C, HTN, Arthritis, GERD and hx of Gout.  Risk to Self: Suicidal Ideation: Yes-Currently Present Suicidal Intent: Yes-Currently Present Is patient at risk for suicide?: Yes Suicidal Plan?: Yes-Currently Present Specify Current Suicidal Plan:  (plan to walk into traffic to be hit) Access to Means: Yes What has been your use of drugs/alcohol within the last 12 months?:  (daily use) How many times?:  (3) Other Self Harm Risks:  (none reported) Triggers for Past Attempts: None known Intentional  Self Injurious Behavior: None Risk to Others: Homicidal Ideation: No (denies) Thoughts of Harm to Others: No (denies) Current Homicidal Intent: No Current Homicidal Plan: No Access to Homicidal Means: No Identified Victim:  (none reported) History of harm to others?: Yes (no details given) Assessment of Violence: In past 6-12 months Does patient have access to weapons?: No Criminal Charges Pending?: No  (denies any legal hx) Does patient have a court date: No Prior Inpatient Therapy: Prior Inpatient Therapy: Yes Prior Therapy Dates:  (multiple) Prior Therapy Facilty/Provider(s):  (multiple) Reason for Treatment:  (Bipolar I; Polysubstance abuse) Prior Outpatient Therapy: Prior Outpatient Therapy: No Does patient have an ACCT team?: No Does patient have Intensive In-House Services?  : No Does patient have [facility] services? : No Does patient have P4[facility] services?: No  Past Medical History:  Past Medical History:  Diagnosis Date  . Alcohol abuse   . Anxiety   . Arthritis    "all over" (11/25/2015)  . Bipolar 1 disorder (HCC)   . Bipolar affective disorder (HCC)   . Chronic back pain   . Chronic kidney disease   . Cocaine abuse    smokes crack-cocaine  . Depression   . GERD (gastroesophageal reflux disease)   . Gout   . Headache    "q couple days; stress, tension, anger" (11/25/2015)  . Hemorrhoids   . Hepatitis C    "not yet treated" (11/25/2015)  . Hypertension   . Insomnia   . Myocardial infarction 11/2015   "supposedly" (11/25/2015)  . OSA on CPAP    "had mask; it was stolen" (11/25/2015)  . PTSD (post-traumatic stress disorder)   . Tobacco abuse   . Vitamin D deficiency     Past Surgical History:  Procedure Laterality Date  . CARDIAC CATHETERIZATION N/A 11/29/2015   Procedure: Left Heart Cath and Coronary Angiography;  Surgeon: Laurey Morale, MD;  Location[facility] INVASIVE CV LAB;  Service: Cardiovascular;  Laterality: N/A;  . FRACTURE SURGERY    . PATELLA FRACTURE SURGERY Left ~ 1995  . TONSILLECTOMY     Family History:  Family History  Problem Relation Age of Onset  . Hypertension Brother   . Diabetes Brother   . Hypertension Mother   . Diabetes Mother   . Arthritis Mother   . Other Sister     bone disease   Family Psychiatric  History: see HPI Social History:  History  Alcohol Use  . Yes    Comment: 11/25/2015 "quit alcohol 6 months ago; used to drink from  morning til night"     History  Drug Use  . Types: Marijuana, "Crack" cocaine, Cocaine    Comment: states that he uses marijuana and cocaine every now and then     Social History   Social History  . Marital status: Single    Spouse name: N/A  . Number of children: N/A  . Years of education: N/A   Social History Main Topics  . Smoking status: Current Every Day Smoker    Packs/day: 0.12    Years: 46.00  . Smokeless tobacco: Never Used  . Alcohol use Yes     Comment: 11/25/2015 "quit alcohol 6 months ago; used to drink from morning til night"  . Drug use: Yes    Types: Marijuana, "Crack" cocaine, Cocaine     Comment: states that he uses marijuana and cocaine every now and then   . Sexual activity: Not Currently   Other Topics Concern  . None  Social History Narrative   ** Merged History Encounter **       Lives alone in [place], [place]   Additional Social History:    Allergies:   Allergies  Allergen Reactions  . Lactose Intolerance (Gi) Hives and Nausea Only    Labs: No results found for this or any previous visit (from the past 48 hour(s)).  Current Facility-Administered Medications  Medication Dose Route Frequency Provider Last Rate Last Dose  . diltiazem (CARDIZEM CD) 24 hr capsule 240 mg  240 mg Oral Daily Maia Plan, MD   240 mg at 05/22/16 0841  . gabapentin (NEURONTIN) capsule 300 mg  300 mg Oral TID Maia Plan, MD   300 mg at 05/22/16 0843  . hydrOXYzine (ATARAX/VISTARIL) tablet 25 mg  25 mg Oral TID PRN Maia Plan, MD   25 mg at 05/22/16 0843  . ibuprofen (ADVIL,MOTRIN) tablet 600 mg  600 mg Oral Q6H PRN Maia Plan, MD   600 mg at 05/21/16 2007  . pantoprazole (PROTONIX) EC tablet 40 mg  40 mg Oral Daily Maia Plan, MD   40 mg at 05/22/16 0844  . potassium chloride SA (K-DUR,KLOR-CON) CR tablet 20 mEq  20 mEq Oral BID Mancel Bale, MD   20 mEq at 05/22/16 0839  . sertraline (ZOLOFT) tablet 100 mg  100 mg Oral Daily Maia Plan, MD   100 mg  at 05/22/16 0842  . tamsulosin (FLOMAX) capsule 0.4 mg  0.4 mg Oral Daily Maia Plan, MD   0.4 mg at 05/22/16 0953  . topiramate (TOPAMAX) tablet 25 mg  25 mg Oral BID Maia Plan, MD   25 mg at 05/22/16 0840  . traZODone (DESYREL) tablet 100 mg  100 mg Oral QHS PRN Maia Plan, MD       Current Outpatient Prescriptions  Medication Sig Dispense Refill  . acetaminophen (TYLENOL) 500 MG tablet Take 500-1,000 mg by mouth 3 (three) times daily as needed (for pain).    . baclofen (LIORESAL) 10 MG tablet Take 10 mg by mouth 3 (three) times daily as needed for muscle spasms.    . hydrocortisone (ANUSOL-HC) 2.5 % rectal cream Place 1 application rectally 2 (two) times daily.    Marland Kitchen lamoTRIgine (LAMICTAL) 25 MG tablet Take 25 mg by mouth at bedtime.    . lidocaine (LIDODERM) 5 % Place 1 patch onto the skin daily. Remove & Discard patch within 12 hours or as directed by MD    . lisinopril (PRINIVIL,ZESTRIL) 40 MG tablet Take 40 mg by mouth daily.    . pantoprazole (PROTONIX) 40 MG tablet Take 1 tablet (40 mg total) by mouth daily.    . QUEtiapine (SEROQUEL) 400 MG tablet Take 400 mg by mouth at bedtime.    Marland Kitchen diltiazem (CARDIZEM CD) 240 MG 24 hr capsule Take 1 capsule (240 mg total) by mouth daily. (Patient not taking: Reported on 05/19/2016) 30 capsule 3  . gabapentin (NEURONTIN) 300 MG capsule Take 1 capsule (300 mg total) by mouth 3 (three) times daily. (Patient not taking: Reported on 05/19/2016) 90 capsule 0  . hydrOXYzine (ATARAX/VISTARIL) 25 MG tablet Take 1 tablet (25 mg total) by mouth 3 (three) times daily as needed for anxiety. (Patient not taking: Reported on 05/19/2016) 30 tablet 0  . ibuprofen (ADVIL,MOTRIN) 200 MG tablet Take 1,000 mg by mouth every 4 (four) hours as needed for moderate pain.    . tamsulosin (FLOMAX) 0.4 MG CAPS capsule Take 1  capsule (0.4 mg total) by mouth daily. (Patient not taking: Reported on 05/19/2016) 30 capsule 0  . topiramate (TOPAMAX) 25 MG tablet Take 25 mg by mouth  2 (two) times daily.    . traZODone (DESYREL) 100 MG tablet Take 1 tablet (100 mg total) by mouth at bedtime as needed for sleep. (Patient not taking: Reported on 05/19/2016) 30 tablet 0   Musculoskeletal: Strength & Muscle Tone: within normal limits Gait & Station: normal Patient leans: N/A  Psychiatric Specialty Exam: Physical Exam  Nursing note and vitals reviewed. Psychiatric: He has a normal mood and affect. His speech is normal and behavior is normal. Judgment and thought content normal. Cognition and memory are normal.    ROS  Blood pressure 122/81, pulse 75, temperature 97.3 F (36.3 C), temperature source Oral, resp. rate 18, height 5\' 7"  (1.702 m), weight 104.3 kg (230 lb), SpO2 95 %.Body mass index is 36.02 kg/m.  General Appearance: Disheveled  Eye Contact:  Good  Speech:  Clear and Coherent  Volume:  Normal  Mood:  Depressed and Hopeless  Affect:  Depressed and Flat  Thought Process:  Coherent  Orientation:  Full (Time, Place, and Person)  Thought Content:  Rumination  Suicidal Thoughts:  No  Homicidal Thoughts:  No  Memory:  Immediate;   Good Recent;   Good Remote;   Good  Judgement:  Fair  Insight:  Fair  Psychomotor Activity:  Normal  Concentration:  Concentration: Fair and Attention Span: Fair  Recall:  of Knowledge:  Fair  Language:  Fair  Akathisia:  No  Handed:  Right  AIMS (if indicated):     Assets:  Communication Skills Physical Health Resilience  ADL's:  Intact  Cognition:  WNL  Sleep:  poor   Treatment Plan Summary: Plan Patient would benefit from overnight stay.  Multiple admissions at [facility] and [facility].  At [facility], historically, patient is not invested in treamtent.  Patient stays in bed most of the time and dfoes not attend groups.  Recommend to be reassessed in the morning.  Disposition: No evidence of imminent risk to self or others at present.   Supportive therapy provided about ongoing stressors. see note above  [facility], NP [facility] 05/22/2016 12:42 PM

## 2016-05-22 NOTE — ED Notes (Signed)
No sitter @ bedside. Staffing & Charge RN aware. No staff available to sit @ this time. Will continue hourly rounding on pt.

## 2016-05-22 NOTE — ED Triage Notes (Signed)
TTS completed. 

## 2016-05-22 NOTE — ED Notes (Signed)
Patient was given a snack and drink, A regular diet ordered for Lunch. 

## 2016-05-23 DIAGNOSIS — F1414 Cocaine abuse with cocaine-induced mood disorder: Secondary | ICD-10-CM

## 2016-05-23 DIAGNOSIS — F3130 Bipolar disorder, current episode depressed, mild or moderate severity, unspecified: Secondary | ICD-10-CM | POA: Diagnosis not present

## 2016-05-23 DIAGNOSIS — Z79899 Other long term (current) drug therapy: Secondary | ICD-10-CM | POA: Diagnosis not present

## 2016-05-23 DIAGNOSIS — F1910 Other psychoactive substance abuse, uncomplicated: Secondary | ICD-10-CM | POA: Diagnosis not present

## 2016-05-23 DIAGNOSIS — Z91011 Allergy to milk products: Secondary | ICD-10-CM

## 2016-05-23 DIAGNOSIS — F1290 Cannabis use, unspecified, uncomplicated: Secondary | ICD-10-CM

## 2016-05-23 DIAGNOSIS — F17210 Nicotine dependence, cigarettes, uncomplicated: Secondary | ICD-10-CM | POA: Diagnosis not present

## 2016-05-23 NOTE — ED Notes (Signed)
Patient was given a snack and drink, and a regular diet was ordered for Lunch. 

## 2016-05-23 NOTE — Progress Notes (Deleted)
Pt has been evaluated via telepsych and recommended pt does not need inpatient treatment. Per Psych NP, pt reported that he felt medications he was taking when he was d/c'd from [facility] in 03/2016 were effective but he could not maintain them once he followed up at [facility] due to financial concerns. NP requested Vesta Mixer be contacted to investigate which medications are options for sponsorship. Contacted [facility] [phone]. Left voicemail requesting call back.  Ilean Skill, MSW, LCSW Clinical Social Work, Disposition  05/23/2016 [phone]

## 2016-05-23 NOTE — ED Notes (Signed)
Pt escorted to cab that SW called for pt by NT's x 2 w/2 wheelchairs full of belongings and pt pulled 2 large duffle bags on wheels.

## 2016-05-23 NOTE — ED Notes (Signed)
ALL belongings returned to pt - MULTIPLE BELONGINGS BAGS, VALUABLES ENVELOPE AND HOME MEDS - returned to pt. Stated he was missing a cell phone. Advised pt no phone listed on inventory sheets - pt then stated OK.

## 2016-05-23 NOTE — ED Notes (Signed)
Left message for SW - notifying of pt's pending d/c.

## 2016-05-23 NOTE — ED Notes (Signed)
Pt noted to be sleeping on bed w/eyes closed. Respirations even, unlabored. Pt awakens to eat meals then returns to sleeping.

## 2016-05-23 NOTE — ED Notes (Signed)
Pt woke - ate breakfast - then returned to sleeping.  

## 2016-05-23 NOTE — ED Notes (Signed)
telepsych completed 

## 2016-05-23 NOTE — Consult Note (Signed)
Telepsych Consultation   Reason for Consult:  Suicidal ideation Referring Physician:  EDP Patient Identification: [name] MRN:  [id number] Principal Diagnosis: Bipolar disorder, current episode depressed, severe, without psychotic features [facility]) Diagnosis:   Patient Active Problem List   Diagnosis Date Noted  . Cocaine abuse with cocaine-induced mood disorder [facility]) [F14.14] 03/20/2016    Priority: High  . Bipolar I disorder, most recent episode depressed (HCC) [F31.30]     Priority: High  . Bipolar disorder, current episode depressed, severe, without psychotic features (HCC) [F31.4]     Priority: High  . Polysubstance dependence (HCC) [F19.20] 03/21/2012    Priority: High  . Polysubstance abuse [F19.10]     Priority: High  . Abnormal nuclear stress test [R94.39]   . Palpitations [R00.2] 11/26/2015  . Troponin level elevated [R74.8] 11/26/2015  . Homeless [Z59.0] 11/26/2015  . Essential hypertension [I10] 11/26/2015  . SVT (supraventricular tachycardia) (HCC) [I47.1]   . Pain in the chest [R07.9] 11/25/2015  . GERD (gastroesophageal reflux disease) [K21.9]   . Bipolar 1 disorder (HCC) [F31.9]   . Back pain, chronic [M54.9, G89.29]   . Chronic kidney disease [N18.9]   . Affective psychosis, bipolar (HCC) [F31.9]   . Alcohol use disorder, severe, dependence (HCC) [F10.20]   . Cocaine use disorder, severe, dependence (HCC) [F14.20]   . Generalized abdominal pain [R10.84]   . Bipolar affective disorder, depressed, severe (HCC) [F31.4] 03/26/2014  . Right hip pain [M25.551]   . Medically noncompliant [Z91.19] 03/23/2014  . Dysuria [R30.0] 03/22/2013  . Headache [R51] 03/22/2013  . Chest pain [R07.9] 02/23/2012  . Anxiety [F41.9]   . Bipolar affective disorder (HCC) [F31.9]   . Depression [F32.9]   . Gout [M10.9]   . Vitamin D deficiency [E55.9]   . Hepatitis C [B19.20]     Total Time spent with patient: 30 minutes  Subjective:   [name] is a 56 y.o. male  patient admitted with suicidal ideation and depression. This pt has a history of coming to the ED for medical concerns, then reporting suicidal ideation when informed he does not need to come in. Pt seen and chart reviewed. Pt is alert/oriented x4, calm, cooperative, and appropriate to situation. Pt denies suicidal/homicidal ideation and psychosis and does not appear to be responding to internal stimuli. Pt was evaluated on 05/19/16 and 05/22/16 by our treatment team. He has made statements about feeling safe and wanting to leave, then changed these statements when seen on camera, citing sudden onset of suicidal ideation when discharge plans are discussed.  As mentioned above, today, he is denying any self-injurious ideations or plans to harm himself or others and would like to discharge to follow-up at [place].     HPI: I have reviewed and concur with HPI elements below, modified as follows: "[name] is an 56 y.o. male who came into the [facility] tonight complaining of medical symptoms including N/V/D and chest pain. Once medically cleared and about to be discharged, stated he was depressed and suicidal and needs MH assistance, having SI with a plan to walk into traffic.  Patient is well known to [facility], his last admission was on 01/2015 with similar presentation, N/V/D/chest pain, once cleared, developed suicidal ideations.  Has attempted suicide in the past.  Has received numerous treatment at [facility], [facility] [facility] [place] and also [facility].  He states that he just needs to be able to get help, "I need it ma'am, maybe you can [facility], I'm a veteran."  He states that he is a harm to himself at the moment.  It is apparent that patient has no place to go, homeless."  Pt has spent the past few nights in the ED without incident and made numerous inconsistent statements. Seen today on 05/23/2016 as above. ED staff report that the pt has been cooperative and has shown no signs of self-harm gestures or  verbalized such.   Psych Hx:  Per pt record, pt has previous diagnoses of Bipolar I D/O, Polysubstance abuse, Anxiety and PTSD. Pt also has multiple medical conditions including hx of MI, Hep C, HTN, Arthritis, GERD and hx of Gout.  Risk to Self: Suicidal Ideation: Yes-Currently Present Suicidal Intent: Yes-Currently Present Is patient at risk for suicide?: Yes Suicidal Plan?: Yes-Currently Present Specify Current Suicidal Plan:  (plan to walk into traffic to be hit) Access to Means: Yes What has been your use of drugs/alcohol within the last 12 months?:  (daily use) How many times?:  (3) Other Self Harm Risks:  (none reported) Triggers for Past Attempts: None known Intentional Self Injurious Behavior: None Risk to Others: Homicidal Ideation: No (denies) Thoughts of Harm to Others: No (denies) Current Homicidal Intent: No Current Homicidal Plan: No Access to Homicidal Means: No Identified Victim:  (none reported) History of harm to others?: Yes (no details given) Assessment of Violence: In past 6-12 months Does patient have access to weapons?: No Criminal Charges Pending?: No (denies any legal hx) Does patient have a court date: No Prior Inpatient Therapy: Prior Inpatient Therapy: Yes Prior Therapy Dates:  (multiple) Prior Therapy Facilty/Provider(s):  (multiple) Reason for Treatment:  (Bipolar I; Polysubstance abuse) Prior Outpatient Therapy: Prior Outpatient Therapy: No Does patient have an ACCT team?: No Does patient have Intensive In-House Services?  : No Does patient have [facility] services? : No Does patient have P4CC services?: No  Past Medical History:  Past Medical History:  Diagnosis Date  . Alcohol abuse   . Anxiety   . Arthritis    "all over" (11/25/2015)  . Bipolar 1 disorder (HCC)   . Bipolar affective disorder (HCC)   . Chronic back pain   . Chronic kidney disease   . Cocaine abuse    smokes crack-cocaine  . Depression   . GERD (gastroesophageal reflux  disease)   . Gout   . Headache    "q couple days; stress, tension, anger" (11/25/2015)  . Hemorrhoids   . Hepatitis C    "not yet treated" (11/25/2015)  . Hypertension   . Insomnia   . Myocardial infarction 11/2015   "supposedly" (11/25/2015)  . OSA on CPAP    "had mask; it was stolen" (11/25/2015)  . PTSD (post-traumatic stress disorder)   . Tobacco abuse   . Vitamin D deficiency     Past Surgical History:  Procedure Laterality Date  . CARDIAC CATHETERIZATION N/A 11/29/2015   Procedure: Left Heart Cath and Coronary Angiography;  Surgeon: Laurey Morale, MD;  Location: [facility] INVASIVE CV LAB;  Service: Cardiovascular;  Laterality: N/A;  . FRACTURE SURGERY    . PATELLA FRACTURE SURGERY Left ~ 1995  . TONSILLECTOMY     Family History:  Family History  Problem Relation Age of Onset  . Hypertension Brother   . Diabetes Brother   . Hypertension Mother   . Diabetes Mother   . Arthritis Mother   . Other Sister     bone disease   Family Psychiatric  History: see HPI Social History:  History  Alcohol Use  . Yes    Comment: 11/25/2015 "quit alcohol 6 months ago; used to drink from morning til night"     History  Drug Use  . Types: Marijuana, "Crack" cocaine, Cocaine    Comment: states that he uses marijuana and cocaine every now and then     Social History   Social History  . Marital status: Single    Spouse name: N/A  . Number of children: N/A  . Years of education: N/A   Social History Main Topics  . Smoking status: Current Every Day Smoker    Packs/day: 0.12    Years: 46.00  . Smokeless tobacco: Never Used  . Alcohol use Yes     Comment: 11/25/2015 "quit alcohol 6 months ago; used to drink from morning til night"  . Drug use: Yes    Types: Marijuana, "Crack" cocaine, Cocaine     Comment: states that he uses marijuana and cocaine every now and then   . Sexual activity: Not Currently   Other Topics Concern  . None   Social History Narrative   ** Merged History  Encounter **       Lives alone in [place], [place]   Additional Social History:    Allergies:   Allergies  Allergen Reactions  . Lactose Intolerance (Gi) Hives and Nausea Only    Labs: No results found for this or any previous visit (from the past 48 hour(s)).  Current Facility-Administered Medications  Medication Dose Route Frequency Provider Last Rate Last Dose  . diltiazem (CARDIZEM CD) 24 hr capsule 240 mg  240 mg Oral Daily Maia Plan, MD   240 mg at 05/23/16 0956  . gabapentin (NEURONTIN) capsule 300 mg  300 mg Oral TID Maia Plan, MD   300 mg at 05/23/16 0957  . hydrOXYzine (ATARAX/VISTARIL) tablet 25 mg  25 mg Oral TID PRN Maia Plan, MD   25 mg at 05/22/16 0843  . ibuprofen (ADVIL,MOTRIN) tablet 600 mg  600 mg Oral Q6H PRN Maia Plan, MD   600 mg at 05/21/16 2007  . pantoprazole (PROTONIX) EC tablet 40 mg  40 mg Oral Daily Maia Plan, MD   40 mg at 05/23/16 0957  . sertraline (ZOLOFT) tablet 100 mg  100 mg Oral Daily Maia Plan, MD   100 mg at 05/23/16 0957  . tamsulosin (FLOMAX) capsule 0.4 mg  0.4 mg Oral Daily Maia Plan, MD   0.4 mg at 05/23/16 0957  . topiramate (TOPAMAX) tablet 25 mg  25 mg Oral BID Maia Plan, MD   25 mg at 05/23/16 0957  . traZODone (DESYREL) tablet 100 mg  100 mg Oral QHS PRN Maia Plan, MD   100 mg at 05/22/16 2337   Current Outpatient Prescriptions  Medication Sig Dispense Refill  . acetaminophen (TYLENOL) 500 MG tablet Take 500-1,000 mg by mouth 3 (three) times daily as needed (for pain).    . baclofen (LIORESAL) 10 MG tablet Take 10 mg by mouth 3 (three) times daily as needed for muscle spasms.    . hydrocortisone (ANUSOL-HC) 2.5 % rectal cream Place 1 application rectally 2 (two) times daily.    [facility] lamoTRIgine (LAMICTAL) 25 MG tablet Take 25 mg by mouth at bedtime.    . lidocaine (LIDODERM) 5 % Place 1 patch onto the skin daily. Remove & Discard patch within 12 hours or as directed by MD    . lisinopril  (PRINIVIL,ZESTRIL) 40  MG tablet Take 40 mg by mouth daily.    . pantoprazole (PROTONIX) 40 MG tablet Take 1 tablet (40 mg total) by mouth daily.    . QUEtiapine (SEROQUEL) 400 MG tablet Take 400 mg by mouth at bedtime.    Marland Kitchen diltiazem (CARDIZEM CD) 240 MG 24 hr capsule Take 1 capsule (240 mg total) by mouth daily. (Patient not taking: Reported on 05/19/2016) 30 capsule 3  . gabapentin (NEURONTIN) 300 MG capsule Take 1 capsule (300 mg total) by mouth 3 (three) times daily. (Patient not taking: Reported on 05/19/2016) 90 capsule 0  . hydrOXYzine (ATARAX/VISTARIL) 25 MG tablet Take 1 tablet (25 mg total) by mouth 3 (three) times daily as needed for anxiety. (Patient not taking: Reported on 05/19/2016) 30 tablet 0  . ibuprofen (ADVIL,MOTRIN) 200 MG tablet Take 1,000 mg by mouth every 4 (four) hours as needed for moderate pain.    . tamsulosin (FLOMAX) 0.4 MG CAPS capsule Take 1 capsule (0.4 mg total) by mouth daily. (Patient not taking: Reported on 05/19/2016) 30 capsule 0  . topiramate (TOPAMAX) 25 MG tablet Take 25 mg by mouth 2 (two) times daily.    . traZODone (DESYREL) 100 MG tablet Take 1 tablet (100 mg total) by mouth at bedtime as needed for sleep. (Patient not taking: Reported on 05/19/2016) 30 tablet 0   Musculoskeletal: UTO, camera  Psychiatric Specialty Exam: Physical Exam  Nursing note and vitals reviewed. Psychiatric: He has a normal mood and affect. His speech is normal and behavior is normal. Judgment and thought content normal. Cognition and memory are normal.    Review of Systems  Psychiatric/Behavioral: Positive for depression and substance abuse. Negative for hallucinations and suicidal ideas. The patient is nervous/anxious and has insomnia.   All other systems reviewed and are negative.   Blood pressure 122/79, pulse 75, temperature 97.4 F (36.3 C), temperature source Oral, resp. rate 18, height 5\' 7"  (1.702 m), weight 104.3 kg (230 lb), SpO2 97 %.Body mass index is 36.02 kg/m.   General Appearance: Casual and Fairly Groomed  Eye Contact:  Good  Speech:  Clear and Coherent  Volume:  Normal  Mood:  Euthymic  Affect:  congruent  Thought Process: Linear, logical, goal-directed   Orientation:  Full (Time, Place, and Person)  Thought Content:  Focused on discharge plans  Suicidal Thoughts:  No  Homicidal Thoughts:  No  Memory:  Immediate;   Good Recent;   Good Remote;   Good  Judgement:  Fair  Insight:  Fair  Psychomotor Activity:  Normal  Concentration:  Concentration: Fair and Attention Span: Fair  Recall:  of Knowledge:  Fair  Language:  Fair  Akathisia:  No  Handed:  Right  AIMS (if indicated):     Assets:  [employer] Resilience  ADL's:  Intact  Cognition:  WNL  Sleep:  poor   Treatment Plan Summary: Bipolar disorder, current episode depressed, severe, without psychotic features (HCC) stable for outpatient management   Disposition: No evidence of imminent risk to self or others at present.   Supportive therapy provided about ongoing stressors. see note above  Manufacturing systems engineer, FNP [facility] 05/23/2016 10:21 AM

## 2016-05-24 ENCOUNTER — Encounter (HOSPITAL_COMMUNITY): Payer: Self-pay | Admitting: Emergency Medicine

## 2016-05-24 ENCOUNTER — Emergency Department (HOSPITAL_COMMUNITY): Payer: Medicare Other

## 2016-05-24 ENCOUNTER — Inpatient Hospital Stay (HOSPITAL_COMMUNITY)
Admission: EM | Admit: 2016-05-24 | Discharge: 2016-05-27 | DRG: 071 | Disposition: A | Discharge status: Home / Self Care | Payer: Medicare Other | Attending: Family Medicine | Admitting: Family Medicine

## 2016-05-24 DIAGNOSIS — I959 Hypotension, unspecified: Secondary | ICD-10-CM | POA: Diagnosis present

## 2016-05-24 DIAGNOSIS — B182 Chronic viral hepatitis C: Secondary | ICD-10-CM | POA: Diagnosis present

## 2016-05-24 DIAGNOSIS — R42 Dizziness and giddiness: Secondary | ICD-10-CM | POA: Diagnosis not present

## 2016-05-24 DIAGNOSIS — F1410 Cocaine abuse, uncomplicated: Secondary | ICD-10-CM | POA: Diagnosis present

## 2016-05-24 DIAGNOSIS — Z833 Family history of diabetes mellitus: Secondary | ICD-10-CM

## 2016-05-24 DIAGNOSIS — Z8249 Family history of ischemic heart disease and other diseases of the circulatory system: Secondary | ICD-10-CM

## 2016-05-24 DIAGNOSIS — I252 Old myocardial infarction: Secondary | ICD-10-CM

## 2016-05-24 DIAGNOSIS — R55 Syncope and collapse: Secondary | ICD-10-CM | POA: Diagnosis present

## 2016-05-24 DIAGNOSIS — E872 Acidosis: Secondary | ICD-10-CM | POA: Diagnosis present

## 2016-05-24 DIAGNOSIS — N179 Acute kidney failure, unspecified: Secondary | ICD-10-CM | POA: Diagnosis present

## 2016-05-24 DIAGNOSIS — F1010 Alcohol abuse, uncomplicated: Secondary | ICD-10-CM | POA: Diagnosis present

## 2016-05-24 DIAGNOSIS — R404 Transient alteration of awareness: Secondary | ICD-10-CM | POA: Diagnosis not present

## 2016-05-24 DIAGNOSIS — Z79899 Other long term (current) drug therapy: Secondary | ICD-10-CM

## 2016-05-24 DIAGNOSIS — Z590 Homelessness: Secondary | ICD-10-CM

## 2016-05-24 DIAGNOSIS — G9340 Encephalopathy, unspecified: Secondary | ICD-10-CM | POA: Diagnosis present

## 2016-05-24 DIAGNOSIS — F1110 Opioid abuse, uncomplicated: Secondary | ICD-10-CM | POA: Diagnosis present

## 2016-05-24 DIAGNOSIS — E162 Hypoglycemia, unspecified: Secondary | ICD-10-CM | POA: Diagnosis not present

## 2016-05-24 DIAGNOSIS — R4020 Unspecified coma: Secondary | ICD-10-CM

## 2016-05-24 DIAGNOSIS — Z8261 Family history of arthritis: Secondary | ICD-10-CM

## 2016-05-24 DIAGNOSIS — R918 Other nonspecific abnormal finding of lung field: Secondary | ICD-10-CM

## 2016-05-24 DIAGNOSIS — R9401 Abnormal electroencephalogram [EEG]: Secondary | ICD-10-CM | POA: Diagnosis present

## 2016-05-24 DIAGNOSIS — Z978 Presence of other specified devices: Secondary | ICD-10-CM

## 2016-05-24 DIAGNOSIS — F17210 Nicotine dependence, cigarettes, uncomplicated: Secondary | ICD-10-CM | POA: Diagnosis present

## 2016-05-24 DIAGNOSIS — F4310 Post-traumatic stress disorder, unspecified: Secondary | ICD-10-CM | POA: Diagnosis present

## 2016-05-24 DIAGNOSIS — G4733 Obstructive sleep apnea (adult) (pediatric): Secondary | ICD-10-CM | POA: Diagnosis present

## 2016-05-24 DIAGNOSIS — R0602 Shortness of breath: Secondary | ICD-10-CM | POA: Diagnosis not present

## 2016-05-24 DIAGNOSIS — F319 Bipolar disorder, unspecified: Secondary | ICD-10-CM | POA: Diagnosis present

## 2016-05-24 DIAGNOSIS — E861 Hypovolemia: Secondary | ICD-10-CM | POA: Diagnosis present

## 2016-05-24 DIAGNOSIS — I10 Essential (primary) hypertension: Secondary | ICD-10-CM | POA: Diagnosis present

## 2016-05-24 LAB — CBC WITH DIFFERENTIAL/PLATELET
Basophils Absolute: 0 10*3/uL (ref 0.0–0.1)
Basophils Relative: 0 %
Eosinophils Absolute: 0.2 10*3/uL (ref 0.0–0.7)
Eosinophils Relative: 1 %
HCT: 39.6 % (ref 39.0–52.0)
Hemoglobin: 13.2 g/dL (ref 13.0–17.0)
Lymphocytes Relative: 15 %
Lymphs Abs: 1.5 10*3/uL (ref 0.7–4.0)
MCH: 28.3 pg (ref 26.0–34.0)
MCHC: 33.3 g/dL (ref 30.0–36.0)
MCV: 84.8 fL (ref 78.0–100.0)
Monocytes Absolute: 1.2 10*3/uL — ABNORMAL HIGH (ref 0.1–1.0)
Monocytes Relative: 11 %
Neutro Abs: 7.6 10*3/uL (ref 1.7–7.7)
Neutrophils Relative %: 73 %
Platelets: 243 10*3/uL (ref 150–400)
RBC: 4.67 MIL/uL (ref 4.22–5.81)
RDW: 15.1 % (ref 11.5–15.5)
WBC: 10.5 10*3/uL (ref 4.0–10.5)

## 2016-05-24 MED ORDER — NALOXONE HCL 2 MG/2ML IJ SOSY
1.0000 mg | PREFILLED_SYRINGE | Freq: Once | INTRAMUSCULAR | Status: AC
  Administered 2016-05-24: 1 mg via INTRAVENOUS

## 2016-05-24 MED ORDER — SODIUM CHLORIDE 0.9 % IV BOLUS (SEPSIS)
1000.0000 mL | Freq: Once | INTRAVENOUS | Status: AC
  Administered 2016-05-24: 1000 mL via INTRAVENOUS

## 2016-05-24 MED ORDER — PROPOFOL 1000 MG/100ML IV EMUL
INTRAVENOUS | Status: AC
  Administered 2016-05-25: 5 ug/kg/min via INTRAVENOUS
  Filled 2016-05-24: qty 100

## 2016-05-24 MED ORDER — NALOXONE HCL 2 MG/2ML IJ SOSY
1.0000 mg | PREFILLED_SYRINGE | Freq: Once | INTRAMUSCULAR | Status: AC
  Administered 2016-05-24: 1 mg via INTRAVENOUS
  Filled 2016-05-24: qty 2

## 2016-05-24 NOTE — ED Triage Notes (Addendum)
BIB [facility], at homeless shelter; pt fell out, but remembers event.  Recent heart attack.  Hx heroin abuse and medication abuse.  86/42.  Responsive to painful stimuli, pinpoint pupils.  After 1000 bolus, remains hypotensive.

## 2016-05-25 ENCOUNTER — Inpatient Hospital Stay (HOSPITAL_COMMUNITY): Payer: Medicare Other

## 2016-05-25 ENCOUNTER — Emergency Department (HOSPITAL_COMMUNITY): Payer: Medicare Other

## 2016-05-25 DIAGNOSIS — F4310 Post-traumatic stress disorder, unspecified: Secondary | ICD-10-CM | POA: Diagnosis present

## 2016-05-25 DIAGNOSIS — G4733 Obstructive sleep apnea (adult) (pediatric): Secondary | ICD-10-CM | POA: Diagnosis not present

## 2016-05-25 DIAGNOSIS — R9401 Abnormal electroencephalogram [EEG]: Secondary | ICD-10-CM | POA: Diagnosis present

## 2016-05-25 DIAGNOSIS — Z79899 Other long term (current) drug therapy: Secondary | ICD-10-CM | POA: Diagnosis not present

## 2016-05-25 DIAGNOSIS — E861 Hypovolemia: Secondary | ICD-10-CM | POA: Diagnosis present

## 2016-05-25 DIAGNOSIS — Z833 Family history of diabetes mellitus: Secondary | ICD-10-CM | POA: Diagnosis not present

## 2016-05-25 DIAGNOSIS — J9601 Acute respiratory failure with hypoxia: Secondary | ICD-10-CM

## 2016-05-25 DIAGNOSIS — I10 Essential (primary) hypertension: Secondary | ICD-10-CM | POA: Diagnosis present

## 2016-05-25 DIAGNOSIS — Z4682 Encounter for fitting and adjustment of non-vascular catheter: Secondary | ICD-10-CM | POA: Diagnosis not present

## 2016-05-25 DIAGNOSIS — Z992 Dependence on renal dialysis: Secondary | ICD-10-CM | POA: Diagnosis not present

## 2016-05-25 DIAGNOSIS — G9340 Encephalopathy, unspecified: Secondary | ICD-10-CM | POA: Diagnosis not present

## 2016-05-25 DIAGNOSIS — Z8261 Family history of arthritis: Secondary | ICD-10-CM | POA: Diagnosis not present

## 2016-05-25 DIAGNOSIS — F1990 Other psychoactive substance use, unspecified, uncomplicated: Secondary | ICD-10-CM

## 2016-05-25 DIAGNOSIS — F1010 Alcohol abuse, uncomplicated: Secondary | ICD-10-CM | POA: Diagnosis present

## 2016-05-25 DIAGNOSIS — R55 Syncope and collapse: Secondary | ICD-10-CM | POA: Diagnosis not present

## 2016-05-25 DIAGNOSIS — N179 Acute kidney failure, unspecified: Secondary | ICD-10-CM | POA: Diagnosis present

## 2016-05-25 DIAGNOSIS — Z590 Homelessness: Secondary | ICD-10-CM | POA: Diagnosis not present

## 2016-05-25 DIAGNOSIS — F1110 Opioid abuse, uncomplicated: Secondary | ICD-10-CM | POA: Diagnosis present

## 2016-05-25 DIAGNOSIS — E162 Hypoglycemia, unspecified: Secondary | ICD-10-CM | POA: Diagnosis present

## 2016-05-25 DIAGNOSIS — R0602 Shortness of breath: Secondary | ICD-10-CM | POA: Diagnosis not present

## 2016-05-25 DIAGNOSIS — I951 Orthostatic hypotension: Secondary | ICD-10-CM | POA: Diagnosis not present

## 2016-05-25 DIAGNOSIS — I252 Old myocardial infarction: Secondary | ICD-10-CM | POA: Diagnosis not present

## 2016-05-25 DIAGNOSIS — I959 Hypotension, unspecified: Secondary | ICD-10-CM | POA: Diagnosis not present

## 2016-05-25 DIAGNOSIS — N189 Chronic kidney disease, unspecified: Secondary | ICD-10-CM | POA: Diagnosis not present

## 2016-05-25 DIAGNOSIS — B182 Chronic viral hepatitis C: Secondary | ICD-10-CM | POA: Diagnosis present

## 2016-05-25 DIAGNOSIS — Z8249 Family history of ischemic heart disease and other diseases of the circulatory system: Secondary | ICD-10-CM | POA: Diagnosis not present

## 2016-05-25 DIAGNOSIS — J90 Pleural effusion, not elsewhere classified: Secondary | ICD-10-CM | POA: Diagnosis not present

## 2016-05-25 DIAGNOSIS — F17210 Nicotine dependence, cigarettes, uncomplicated: Secondary | ICD-10-CM | POA: Diagnosis present

## 2016-05-25 DIAGNOSIS — E872 Acidosis: Secondary | ICD-10-CM | POA: Diagnosis present

## 2016-05-25 DIAGNOSIS — F1410 Cocaine abuse, uncomplicated: Secondary | ICD-10-CM | POA: Diagnosis present

## 2016-05-25 DIAGNOSIS — F319 Bipolar disorder, unspecified: Secondary | ICD-10-CM | POA: Diagnosis present

## 2016-05-25 LAB — TROPONIN I
Troponin I: <0.03 ng/mL (ref <0.03)
Troponin I: <0.03 ng/mL (ref <0.03)
Troponin I: <0.03 ng/mL (ref <0.03)
Troponin I: <0.03 ng/mL (ref <0.03)

## 2016-05-25 LAB — COMPREHENSIVE METABOLIC PANEL
ALT: 36 U/L (ref 17–63)
AST: 28 U/L (ref 15–41)
Albumin: 3.9 g/dL (ref 3.5–5.0)
Alkaline Phosphatase: 44 U/L (ref 38–126)
Anion gap: 12 (ref 5–15)
BUN: 18 mg/dL (ref 6–20)
CO2: 20 mmol/L — ABNORMAL LOW (ref 22–32)
Calcium: 9.2 mg/dL (ref 8.9–10.3)
Chloride: 109 mmol/L (ref 101–111)
Creatinine, Ser: 2.57 mg/dL — ABNORMAL HIGH (ref 0.61–1.24)
GFR calc Af Amer: 31 mL/min — ABNORMAL LOW (ref >60)
GFR calc non Af Amer: 26 mL/min — ABNORMAL LOW (ref >60)
Glucose, Bld: 57 mg/dL — ABNORMAL LOW (ref 65–99)
Potassium: 3.9 mmol/L (ref 3.5–5.1)
Sodium: 141 mmol/L (ref 135–145)
Total Bilirubin: 0.6 mg/dL (ref 0.3–1.2)
Total Protein: 6.8 g/dL (ref 6.5–8.1)

## 2016-05-25 LAB — I-STAT ARTERIAL BLOOD GAS, ED
Acid-base deficit: 7 mmol/L — ABNORMAL HIGH (ref 0.0–2.0)
Acid-base deficit: 5 mmol/L — ABNORMAL HIGH (ref 0.0–2.0)
Bicarbonate: 20.4 mmol/L (ref 20.0–28.0)
Bicarbonate: 18.5 mmol/L — ABNORMAL LOW (ref 20.0–28.0)
O2 Saturation: 100 %
O2 Saturation: 99 %
Patient temperature: 96.9
Patient temperature: 96.9
TCO2: 22 mmol/L (ref 0–100)
TCO2: 19 mmol/L (ref 0–100)
pCO2 arterial: 45 mmHg (ref 32.0–48.0)
pCO2 arterial: 29 mmHg — ABNORMAL LOW (ref 32.0–48.0)
pH, Arterial: 7.258 — ABNORMAL LOW (ref 7.350–7.450)
pH, Arterial: 7.408 (ref 7.350–7.450)
pO2, Arterial: 462 mmHg — ABNORMAL HIGH (ref 83.0–108.0)
pO2, Arterial: 148 mmHg — ABNORMAL HIGH (ref 83.0–108.0)

## 2016-05-25 LAB — GLUCOSE, CAPILLARY
Glucose-Capillary: 106 mg/dL — ABNORMAL HIGH (ref 65–99)
Glucose-Capillary: 112 mg/dL — ABNORMAL HIGH (ref 65–99)
Glucose-Capillary: 124 mg/dL — ABNORMAL HIGH (ref 65–99)
Glucose-Capillary: 61 mg/dL — ABNORMAL LOW (ref 65–99)
Glucose-Capillary: 70 mg/dL (ref 65–99)
Glucose-Capillary: 71 mg/dL (ref 65–99)
Glucose-Capillary: 86 mg/dL (ref 65–99)
Glucose-Capillary: 86 mg/dL (ref 65–99)

## 2016-05-25 LAB — URINALYSIS, ROUTINE W REFLEX MICROSCOPIC
Bilirubin Urine: NEGATIVE
Glucose, UA: NEGATIVE mg/dL
Hgb urine dipstick: NEGATIVE
Ketones, ur: NEGATIVE mg/dL
Nitrite: NEGATIVE
Protein, ur: NEGATIVE mg/dL
Specific Gravity, Urine: <1.005 — ABNORMAL LOW (ref 1.005–1.030)
pH: 6 (ref 5.0–8.0)

## 2016-05-25 LAB — URINALYSIS, MICROSCOPIC (REFLEX)

## 2016-05-25 LAB — BASIC METABOLIC PANEL
Anion gap: 9 (ref 5–15)
BUN: 15 mg/dL (ref 6–20)
CO2: 21 mmol/L — ABNORMAL LOW (ref 22–32)
Calcium: 8.8 mg/dL — ABNORMAL LOW (ref 8.9–10.3)
Chloride: 112 mmol/L — ABNORMAL HIGH (ref 101–111)
Creatinine, Ser: 1.75 mg/dL — ABNORMAL HIGH (ref 0.61–1.24)
GFR calc Af Amer: 49 mL/min — ABNORMAL LOW (ref >60)
GFR calc non Af Amer: 42 mL/min — ABNORMAL LOW (ref >60)
Glucose, Bld: 90 mg/dL (ref 65–99)
Potassium: 4.4 mmol/L (ref 3.5–5.1)
Sodium: 142 mmol/L (ref 135–145)

## 2016-05-25 LAB — CBG MONITORING, ED
Glucose-Capillary: 107 mg/dL — ABNORMAL HIGH (ref 65–99)
Glucose-Capillary: 105 mg/dL — ABNORMAL HIGH (ref 65–99)

## 2016-05-25 LAB — CBC
HCT: 40.7 % (ref 39.0–52.0)
Hemoglobin: 13.4 g/dL (ref 13.0–17.0)
MCH: 28 pg (ref 26.0–34.0)
MCHC: 32.9 g/dL (ref 30.0–36.0)
MCV: 85.1 fL (ref 78.0–100.0)
Platelets: 227 10*3/uL (ref 150–400)
RBC: 4.78 MIL/uL (ref 4.22–5.81)
RDW: 15.3 % (ref 11.5–15.5)
WBC: 11.1 10*3/uL — ABNORMAL HIGH (ref 4.0–10.5)

## 2016-05-25 LAB — RAPID URINE DRUG SCREEN, HOSP PERFORMED
Amphetamines: NOT DETECTED
Barbiturates: NOT DETECTED
Benzodiazepines: NOT DETECTED
Cocaine: NOT DETECTED
Opiates: POSITIVE — AB
Tetrahydrocannabinol: POSITIVE — AB

## 2016-05-25 LAB — MAGNESIUM: Magnesium: 1.9 mg/dL (ref 1.7–2.4)

## 2016-05-25 LAB — MRSA PCR SCREENING: MRSA by PCR: NEGATIVE

## 2016-05-25 LAB — ETHANOL: Alcohol, Ethyl (B): <5 mg/dL (ref <5)

## 2016-05-25 LAB — SALICYLATE LEVEL: Salicylate Lvl: <7 mg/dL (ref 2.8–30.0)

## 2016-05-25 LAB — PHOSPHORUS: Phosphorus: 4.4 mg/dL (ref 2.5–4.6)

## 2016-05-25 LAB — ACETAMINOPHEN LEVEL: Acetaminophen (Tylenol), Serum: <10 ug/mL — ABNORMAL LOW (ref 10–30)

## 2016-05-25 LAB — OSMOLALITY: Osmolality: 302 mOsm/kg — ABNORMAL HIGH (ref 275–295)

## 2016-05-25 MED ORDER — HEPARIN SODIUM (PORCINE) 5000 UNIT/ML IJ SOLN
5000.0000 [IU] | Freq: Three times a day (TID) | INTRAMUSCULAR | Status: DC
  Administered 2016-05-25 – 2016-05-27 (×7): 5000 [IU] via SUBCUTANEOUS
  Filled 2016-05-25 (×9): qty 1

## 2016-05-25 MED ORDER — ETOMIDATE 2 MG/ML IV SOLN
INTRAVENOUS | Status: AC | PRN
  Administered 2016-05-25: 20 mg via INTRAVENOUS

## 2016-05-25 MED ORDER — DEXTROSE 50 % IV SOLN
INTRAVENOUS | Status: AC
  Administered 2016-05-25: 25 mL via INTRAVENOUS
  Filled 2016-05-25: qty 50

## 2016-05-25 MED ORDER — DEXTROSE 50 % IV SOLN
1.0000 | Freq: Once | INTRAVENOUS | Status: AC
  Administered 2016-05-25: 50 mL via INTRAVENOUS

## 2016-05-25 MED ORDER — SUCCINYLCHOLINE CHLORIDE 20 MG/ML IJ SOLN
INTRAMUSCULAR | Status: AC | PRN
  Administered 2016-05-25: 150 mg via INTRAVENOUS

## 2016-05-25 MED ORDER — PROPOFOL 1000 MG/100ML IV EMUL
5.0000 ug/kg/min | Freq: Once | INTRAVENOUS | Status: AC
  Administered 2016-05-25: 20 ug/kg/min via INTRAVENOUS
  Administered 2016-05-25: 5 ug/kg/min via INTRAVENOUS
  Filled 2016-05-25: qty 100

## 2016-05-25 MED ORDER — DEXTROSE 50 % IV SOLN
INTRAVENOUS | Status: AC
  Filled 2016-05-25: qty 50

## 2016-05-25 MED ORDER — ORAL CARE MOUTH RINSE
15.0000 mL | Freq: Four times a day (QID) | OROMUCOSAL | Status: DC
  Administered 2016-05-25 – 2016-05-27 (×5): 15 mL via OROMUCOSAL

## 2016-05-25 MED ORDER — DEXTROSE 50 % IV SOLN
25.0000 mL | Freq: Once | INTRAVENOUS | Status: AC
  Administered 2016-05-25: 25 mL via INTRAVENOUS

## 2016-05-25 MED ORDER — DEXTROSE 10 % IV SOLN: INTRAVENOUS | Status: DC

## 2016-05-25 MED ORDER — SODIUM CHLORIDE 0.9 % IV SOLN
INTRAVENOUS | Status: DC
  Administered 2016-05-25: 05:00:00 via INTRAVENOUS

## 2016-05-25 MED ORDER — PROPOFOL 1000 MG/100ML IV EMUL
5.0000 ug/kg/min | INTRAVENOUS | Status: DC
  Administered 2016-05-25: 30 ug/kg/min via INTRAVENOUS
  Filled 2016-05-25 (×2): qty 100

## 2016-05-25 MED ORDER — CHLORHEXIDINE GLUCONATE 0.12% ORAL RINSE (MEDLINE KIT)
15.0000 mL | Freq: Two times a day (BID) | OROMUCOSAL | Status: DC
  Administered 2016-05-25 – 2016-05-27 (×3): 15 mL via OROMUCOSAL

## 2016-05-25 MED ORDER — DEXTROSE 10 % IV SOLN
INTRAVENOUS | Status: DC
  Administered 2016-05-25: 15:00:00 via INTRAVENOUS

## 2016-05-25 MED ORDER — PANTOPRAZOLE SODIUM 40 MG IV SOLR
40.0000 mg | Freq: Every day | INTRAVENOUS | Status: DC
  Administered 2016-05-25: 40 mg via INTRAVENOUS
  Filled 2016-05-25: qty 40

## 2016-05-25 NOTE — H&P (Signed)
[facility] PULMONARY / CRITICAL CARE MEDICINE   Name: [name] MRN: [id number] DOB: 1961-01-12    ADMISSION DATE:  05/24/2016 CONSULTATION DATE:  05/25/2016  REFERRING MD:  Dr. Lynelle [name] EDP  CHIEF COMPLAINT:  AMS  HISTORY OF PRESENT ILLNESS:   56 year old male with PMH as below, which is significant for CKD, HTN, Hepatitis C, OSA on CPAP, Bipolar disorder, Cocaine abuse, IVDA, and alcohol abuse.  EMS was called to homeless shelter to evaluate someone there, and while they were there, [name] (not the patient they were there to see) suddenly passed out "slumped to the floor". Initially after the event he was responsive, and was found to be hypotensive. In the ED he was minimally responsive, but was able to state that he had NOT been using heroin. He did not respond to narcan. His mental status continued to decline and he was intubated for airway protection. He remained hypotensive despite 3L IVF. PCCM asked to admit.  PAST MEDICAL HISTORY :  He  has a past medical history of Alcohol abuse; Anxiety; Arthritis; Bipolar 1 disorder (HCC); Bipolar affective disorder (HCC); Chronic back pain; Chronic kidney disease; Cocaine abuse; Depression; GERD (gastroesophageal reflux disease); Gout; Headache; Hemorrhoids; Hepatitis C; Hypertension; Insomnia; Myocardial infarction (11/2015); OSA on CPAP; PTSD (post-traumatic stress disorder); Tobacco abuse; and Vitamin D deficiency.  PAST SURGICAL HISTORY: He  has a past surgical history that includes Tonsillectomy; Patella fracture surgery (Left, ~ 1995); Fracture surgery; and Cardiac catheterization (N/A, 11/29/2015).  Allergies  Allergen Reactions  . Lactose Intolerance (Gi) Hives and Nausea Only    No current facility-administered medications on file prior to encounter.    Current Outpatient Prescriptions on File Prior to Encounter  Medication Sig  . acetaminophen (TYLENOL) 500 MG tablet Take 500-1,000 mg by mouth 3 (three) times daily as needed (for pain).   . baclofen (LIORESAL) 10 MG tablet Take 10 mg by mouth 3 (three) times daily as needed for muscle spasms.  Marland Kitchen diltiazem (CARDIZEM CD) 240 MG 24 hr capsule Take 1 capsule (240 mg total) by mouth daily. (Patient not taking: Reported on 05/19/2016)  . gabapentin (NEURONTIN) 300 MG capsule Take 1 capsule (300 mg total) by mouth 3 (three) times daily. (Patient not taking: Reported on 05/19/2016)  . hydrocortisone (ANUSOL-HC) 2.5 % rectal cream Place 1 application rectally 2 (two) times daily.  . hydrOXYzine (ATARAX/VISTARIL) 25 MG tablet Take 1 tablet (25 mg total) by mouth 3 (three) times daily as needed for anxiety. (Patient not taking: Reported on 05/19/2016)  . ibuprofen (ADVIL,MOTRIN) 200 MG tablet Take 1,000 mg by mouth every 4 (four) hours as needed for moderate pain.  Marland Kitchen lamoTRIgine (LAMICTAL) 25 MG tablet Take 25 mg by mouth at bedtime.  . lidocaine (LIDODERM) 5 % Place 1 patch onto the skin daily. Remove & Discard patch within 12 hours or as directed by MD  . lisinopril (PRINIVIL,ZESTRIL) 40 MG tablet Take 40 mg by mouth daily.  . pantoprazole (PROTONIX) 40 MG tablet Take 1 tablet (40 mg total) by mouth daily.  . QUEtiapine (SEROQUEL) 400 MG tablet Take 400 mg by mouth at bedtime.  . tamsulosin (FLOMAX) 0.4 MG CAPS capsule Take 1 capsule (0.4 mg total) by mouth daily. (Patient not taking: Reported on 05/19/2016)  . topiramate (TOPAMAX) 25 MG tablet Take 25 mg by mouth 2 (two) times daily.  . traZODone (DESYREL) 100 MG tablet Take 1 tablet (100 mg total) by mouth at bedtime as needed for sleep. (Patient not taking: Reported  on 05/19/2016)    FAMILY HISTORY:  His indicated that the status of his mother is unknown. He indicated that the status of his sister is unknown.    SOCIAL HISTORY: He  reports that he has been smoking.  He has a 5.52 pack-year smoking history. He has never used smokeless tobacco. He reports that he drinks alcohol. He reports that he uses drugs, including Marijuana, "Crack"  cocaine, and Cocaine.  REVIEW OF SYSTEMS:   Unable  SUBJECTIVE:    VITAL SIGNS: BP 90/69   Pulse 97   Temp 97.4 F (36.3 C) (Oral)   Resp 22   Ht 5\' 9"  (1.753 m)   SpO2 100%   HEMODYNAMICS:    VENTILATOR SETTINGS: Vent Mode: PRVC FiO2 (%):  [100 %] 100 % Set Rate:  [15 bmp-20 bmp] 20 bmp Vt Set:  [600 mL] 600 mL PEEP:  [5 cmH20] 5 cmH20 Plateau Pressure:  [17 cmH20] 17 cmH20  INTAKE / OUTPUT: No intake/output data recorded.  PHYSICAL EXAMINATION: General:  Obese male in NAD on vent Neuro:  Sedated, pupils pinpoint and sluggish. HEENT:  [place]/AT, PERRL, no JVD Cardiovascular:  RRR, no MRG Lungs:  Clear, bilateral breath sounds Abdomen:  Soft, non-distended Musculoskeletal:  No acute deformity or ROM limitation Skin:  Grossly intact  LABS:  BMET  Recent Labs Lab 05/18/16 1529 05/20/16 1105 05/24/16 2304  NA 140  --  141  K 3.4* 3.3* 3.9  CL 104  --  109  CO2 25  --  20*  BUN 9  --  18  CREATININE 0.92  --  2.57*  GLUCOSE 86  --  57*    Electrolytes  Recent Labs Lab 05/18/16 1529 05/24/16 2304  CALCIUM 9.5 9.2    CBC  Recent Labs Lab 05/18/16 1529 05/24/16 2304  WBC 7.7 10.5  HGB 13.7 13.2  HCT 40.9 39.6  PLT 265 243    Coag's No results for input(s): APTT, INR in the last 168 hours.  Sepsis Markers No results for input(s): LATICACIDVEN, PROCALCITON, O2SATVEN in the last 168 hours.  ABG  Recent Labs Lab 05/25/16 0137  PHART 7.258*  PCO2ART 45.0  PO2ART 462.0*    Liver Enzymes  Recent Labs Lab 05/18/16 1529 05/24/16 2304  AST 49* 28  ALT 60 36  ALKPHOS 44 44  BILITOT 0.6 0.6  ALBUMIN 3.9 3.9    Cardiac Enzymes  Recent Labs Lab 05/24/16 2304  TROPONINI <0.03    Glucose  Recent Labs Lab 05/25/16 0152  GLUCAP 107*    Imaging Ct Head Wo Contrast  Result Date: 05/25/2016 CLINICAL DATA:  Syncopal episode. Intubated. History of polysubstance abuse, recent heart attack. EXAM: CT HEAD WITHOUT CONTRAST  TECHNIQUE: Contiguous axial images were obtained from the base of the skull through the vertex without intravenous contrast. COMPARISON:  CT HEAD September 25, 2015 FINDINGS: BRAIN: The ventricles and sulci are proportionally mildly prominent for age. No intraparenchymal hemorrhage, mass effect nor midline shift. Patchy supratentorial white matter hypodensities. No acute large vascular territory infarcts. Similar mildly prominent low-density cerebral spinal fluid space interhemispheric fissure. Basal cisterns are patent. VASCULAR: Moderate to severe calcific atherosclerosis of the carotid siphons. SKULL: No skull fracture. No significant scalp soft tissue swelling. SINUSES/ORBITS: The mastoid air-cells and included paranasal sinuses are well-aerated.The included ocular globes and orbital contents are non-suspicious. OTHER: Life-support lines in place. IMPRESSION: No acute intracranial process. Moderate to severe atherosclerosis. Mild parenchymal brain volume loss for age and mild atherosclerosis. Electronically Signed  By: Awilda Metro M.D.   On: 05/25/2016 01:02   Dg Chest Portable 1 View  Result Date: 05/25/2016 CLINICAL DATA:  Post intubation and orogastric tube placement EXAM: PORTABLE CHEST 1 VIEW COMPARISON:  Chest radiograph January 21, 2017 FINDINGS: Cardiomediastinal silhouette is normal for this low inspiratory examination with crowded vascular markings. Bandlike density RIGHT midlung zone. Soft tissue density projecting at RIGHT mainstem bronchus. Endotracheal tube tip projects 2.8 cm above the carina. Distal tip of nasogastric and side port projecting in the proximal stomach. No pleural effusion or focal consolidation. No pneumothorax. Soft tissue planes and included osseous structure nonsuspicious. IMPRESSION: Pulmonary vascular congestion, suspected debris or mass RIGHT mainstem bronchus. RIGHT midlung zone atelectasis. Endotracheal tube tip projects 2.8 cm above the carina. Distal tip of  nasogastric and side port projecting in the proximal stomach. Electronically Signed   By: Awilda Metro M.D.   On: 05/25/2016 00:36   Dg Chest Port 1 View  Result Date: 05/24/2016 CLINICAL DATA:  Syncope, shortness of breath. History of polysubstance abuse, chronic kidney disease, hypertension. EXAM: PORTABLE CHEST 1 VIEW COMPARISON:  Chest radiograph May 18, 2016 FINDINGS: Cardiomediastinal silhouette is unremarkable for this low inspiratory examination with crowded engorged vasculature markings. The lungs are clear without pleural effusions or focal consolidations. Trachea projects midline and there is no pneumothorax. Included soft tissue planes and osseous structures are non-suspicious. IMPRESSION: Pulmonary vascular congestion in this low inspiratory portable examination. Electronically Signed   By: Awilda Metro M.D.   On: 05/24/2016 23:31     STUDIES:  CT head 2/14 > non-acute  CULTURES:   ANTIBIOTICS:   SIGNIFICANT EVENTS: 2/14 > admit  LINES/TUBES: ETT 2/14 >   ASSESSMENT / PLAN:  PULMONARY A: Inability to protect airway due to encephalopathy R hilar mass? Described on CXR OSA on CPAP, doubt complaint as he is reportedly homeless. Tobacco abuse  P:   Full vent support Follow ABG VAP bundle CT chest without contrast  CARDIOVASCULAR A:  Hypotension, suspect secondary to hypovolemia H/o HTN  P:  Telemetry Continue IVF resuscitation > has responded well so far May need CVL/Pressors  RENAL A:   AKI CKD NAG acidosis  P:   Follow BMP Hydrate Repeat ABG, if not resolved would start HCO3 infusion low dose.   GASTROINTESTINAL A:   Hepatitis C ETOH abuse  P:   NPO Protonix for SUP LFTs wnl  HEMATOLOGIC A:   No acute issues  P:  Follow CBC  INFECTIOUS A:   No acute issues  P:   Follow WBC and fever curve  ENDOCRINE A:   Hypoglycemia  P:   Improved with D50 boluses  NEUROLOGIC A:   Acute metabolic vs toxic  encephalopathy Polysubstance abuse history (UDS positive for opiates and THC)  P:   RASS goal: 0 to -1 Propofol infusion, decrease dose. If BP cannot tolerate will need to change.  PRN fentanyl   FAMILY  - Updates: No family listed  - Inter-disciplinary family meet or Palliative Care meeting due by:  2/21    Joneen Roach, AGACNP-BC [facility] Pager [phone] or [phone]  05/25/2016 2:53 AM

## 2016-05-25 NOTE — Care Management Note (Signed)
Case Management Note  Patient Details  Name: JAYSTIN MCGARVEY MRN: [id number] Date of Birth: 1960-09-08  Subjective/Objective:       Pt admitted post "falling out " at homeless shelter - noted to be hypotensive             Action/Plan:   PTA from a homeless shelter.  CSW actively following.  Pt is currently intubated.  CM will continue to follow for discharge needs   Expected Discharge Date:                  Expected Discharge Plan:     In-House Referral:  Clinical Social Work  Discharge planning Services  CM Consult  Post Acute Care Choice:    Choice offered to:     DME Arranged:    DME Agency:     HH Arranged:    HH Agency:     Status of Service:  In process, will continue to follow  If discussed at Long Length of Stay Meetings, dates discussed:    Additional Comments:  Cherylann Parr, RN 05/25/2016, 11:04 AM

## 2016-05-25 NOTE — Procedures (Signed)
Extubation Procedure Note  Patient Details:   Name: [name] DOB: 1960-08-06 MRN: [id number]   Airway Documentation:  Airway 8 mm (Active)  Secured at (cm) 25 cm 05/25/2016  8:44 AM  Measured From Lips 05/25/2016  8:44 AM  Secured Location Right 05/25/2016  8:44 AM  Secured By [employer] 05/25/2016  8:44 AM  Tube Holder Repositioned Yes 05/25/2016  8:44 AM  Cuff Pressure (cm H2O) 28 cm H2O 05/25/2016  8:44 AM  Site Condition Dry 05/25/2016  8:44 AM    Evaluation  O2 sats: stable throughout Complications: No apparent complications Patient did tolerate procedure well. Bilateral Breath Sounds: Clear, Diminished   Yes  RT extubated patient to 2L nasal canula. Patient oriented to year and his name.  Morley Kos 05/25/2016, 11:52 AM

## 2016-05-25 NOTE — ED Provider Notes (Signed)
[facility]-EMERGENCY DEPT Provider Note   CSN: [id number] Arrival date & time: 05/24/16  2250  Time seen 23:57 PM   History   Chief Complaint No chief complaint on file.   HPI [name] is a 56 y.o. male.  HPI  EMS reports they [facility] a homeless shelter seeing  another patient with blood sugar problems and this patient was sitting in a chair and suddenly passed out. They relate he slumped to the floor. They report he has a history of polysubstance abuse and heroin abuse. They state his CBG was 155. They report initial blood pressure was 86/42. Patient is arousable and will speak however he then was back to sleep. He denies headache, chest, shortness of breath, abdominal pain. He states "I got dizzy and passed out". When patient over her discussion about possible heroin abuse he woke up and stated he was not doing heroin.  PCP [facility]   Past Medical History:  Diagnosis Date  . Alcohol abuse   . Anxiety   . Arthritis    "all over" (11/25/2015)  . Bipolar 1 disorder (HCC)   . Bipolar affective disorder (HCC)   . Chronic back pain   . Chronic kidney disease   . Cocaine abuse    smokes crack-cocaine  . Depression   . GERD (gastroesophageal reflux disease)   . Gout   . Headache    "q couple days; stress, tension, anger" (11/25/2015)  . Hemorrhoids   . Hepatitis C    "not yet treated" (11/25/2015)  . Hypertension   . Insomnia   . Myocardial infarction 11/2015   "supposedly" (11/25/2015)  . OSA on CPAP    "had mask; it was stolen" (11/25/2015)  . PTSD (post-traumatic stress disorder)   . Tobacco abuse   . Vitamin D deficiency     Patient Active Problem List   Diagnosis Date Noted  . Acute encephalopathy 05/25/2016  . Cocaine abuse with cocaine-induced mood disorder (HCC) 03/20/2016  . Abnormal nuclear stress test   . Palpitations 11/26/2015  . Troponin level elevated 11/26/2015  . Homeless 11/26/2015  . Essential hypertension 11/26/2015  . SVT  (supraventricular tachycardia) (HCC)   . Pain in the chest 11/25/2015  . GERD (gastroesophageal reflux disease)   . Bipolar 1 disorder (HCC)   . Back pain, chronic   . Chronic kidney disease   . Affective psychosis, bipolar (HCC)   . Alcohol use disorder, severe, dependence (HCC)   . Cocaine use disorder, severe, dependence (HCC)   . Generalized abdominal pain   . Bipolar I disorder, most recent episode depressed (HCC)   . Bipolar disorder, current episode depressed, severe, without psychotic features (HCC)   . Bipolar affective disorder, depressed, severe (HCC) 03/26/2014  . Right hip pain   . Medically noncompliant 03/23/2014  . Dysuria 03/22/2013  . Headache 03/22/2013  . Polysubstance dependence (HCC) 03/21/2012  . Chest pain 02/23/2012  . Anxiety   . Bipolar affective disorder (HCC)   . Depression   . Polysubstance abuse   . Gout   . Vitamin D deficiency   . Hepatitis C     Past Surgical History:  Procedure Laterality Date  . CARDIAC CATHETERIZATION N/A 11/29/2015   Procedure: Left Heart Cath and Coronary Angiography;  Surgeon: Laurey Morale, MD;  Location: [facility] INVASIVE CV LAB;  Service: Cardiovascular;  Laterality: N/A;  . FRACTURE SURGERY    . PATELLA FRACTURE SURGERY Left ~ 1995  . TONSILLECTOMY  Home Medications    Prior to Admission medications   Medication Sig Start Date End Date Taking? Authorizing Provider  acetaminophen (TYLENOL) 500 MG tablet Take 500-1,000 mg by mouth 3 (three) times daily as needed (for pain).    Historical Provider, MD  baclofen (LIORESAL) 10 MG tablet Take 10 mg by mouth 3 (three) times daily as needed for muscle spasms.    Historical Provider, MD  diltiazem (CARDIZEM CD) 240 MG 24 hr capsule Take 1 capsule (240 mg total) by mouth daily. Patient not taking: Reported on 05/19/2016 11/29/15   Thomasene Lot, MD  gabapentin (NEURONTIN) 300 MG capsule Take 1 capsule (300 mg total) by mouth 3 (three) times daily. Patient not taking:  Reported on 05/19/2016 02/01/15   Beau Fanny, FNP  hydrocortisone (ANUSOL-HC) 2.5 % rectal cream Place 1 application rectally 2 (two) times daily.    Historical Provider, MD  hydrOXYzine (ATARAX/VISTARIL) 25 MG tablet Take 1 tablet (25 mg total) by mouth 3 (three) times daily as needed for anxiety. Patient not taking: Reported on 05/19/2016 02/01/15   Beau Fanny, FNP  ibuprofen (ADVIL,MOTRIN) 200 MG tablet Take 1,000 mg by mouth every 4 (four) hours as needed for moderate pain.    Historical Provider, MD  lamoTRIgine (LAMICTAL) 25 MG tablet Take 25 mg by mouth at bedtime.    Historical Provider, MD  lidocaine (LIDODERM) 5 % Place 1 patch onto the skin daily. Remove & Discard patch within 12 hours or as directed by MD    Historical Provider, MD  lisinopril (PRINIVIL,ZESTRIL) 40 MG tablet Take 40 mg by mouth daily.    Historical Provider, MD  pantoprazole (PROTONIX) 40 MG tablet Take 1 tablet (40 mg total) by mouth daily. 02/01/15   Beau Fanny, FNP  QUEtiapine (SEROQUEL) 400 MG tablet Take 400 mg by mouth at bedtime.    Historical Provider, MD  tamsulosin (FLOMAX) 0.4 MG CAPS capsule Take 1 capsule (0.4 mg total) by mouth daily. Patient not taking: Reported on 05/19/2016 02/01/15   Beau Fanny, FNP  topiramate (TOPAMAX) 25 MG tablet Take 25 mg by mouth 2 (two) times daily. 04/21/16 05/21/16  Historical Provider, MD  traZODone (DESYREL) 100 MG tablet Take 1 tablet (100 mg total) by mouth at bedtime as needed for sleep. Patient not taking: Reported on 05/19/2016 02/01/15   Beau Fanny, FNP    Family History Family History  Problem Relation Age of Onset  . Hypertension Brother   . Diabetes Brother   . Hypertension Mother   . Diabetes Mother   . Arthritis Mother   . Other Sister     bone disease    Social History Social History  Substance Use Topics  . Smoking status: Current Every Day Smoker    Packs/day: 0.12    Years: 46.00  . Smokeless tobacco: Never Used  . Alcohol use Yes       Comment: 11/25/2015 "quit alcohol 6 months ago; used to drink from morning til night"     Allergies   Lactose intolerance (gi)   Review of Systems Review of Systems  Unable to perform ROS: Mental status change     Physical Exam Updated Vital Signs BP (!) 84/53 (BP Location: Right Arm)   Pulse 90   Temp 97.4 F (36.3 C) (Oral)   Resp 12   SpO2 100%   Vital signs normal except for hypotension   Physical Exam  Constitutional: He is oriented to person, place, and time. He appears  well-developed and well-nourished.  Non-toxic appearance. He does not appear ill. No distress.  HENT:  Head: Normocephalic and atraumatic.  Right Ear: External ear normal.  Left Ear: External ear normal.  Nose: Nose normal. No mucosal edema or rhinorrhea.  Mouth/Throat: Oropharynx is clear and moist and mucous membranes are normal. No dental abscesses or uvula swelling.  Eyes: Conjunctivae and EOM are normal. Pupils are equal, round, and reactive to light.  Neck: Normal range of motion and full passive range of motion without pain. Neck supple.  Cardiovascular: Normal rate, regular rhythm and normal heart sounds.  Exam reveals no gallop and no friction rub.   No murmur heard. Pulmonary/Chest: Effort normal and breath sounds normal. No respiratory distress. He has no wheezes. He has no rhonchi. He has no rales. He exhibits no tenderness and no crepitus.  Abdominal: Soft. Normal appearance and bowel sounds are normal. He exhibits no distension. There is no tenderness. There is no rebound and no guarding.  Musculoskeletal: Normal range of motion. He exhibits no edema or tenderness.  Moves all extremities well.   Neurological: He is alert and oriented to person, place, and time. He has normal strength. No cranial nerve deficit.  Skin: Skin is warm, dry and intact. No rash noted. No erythema. No pallor.  Pt has a bruised area on the volar aspect of his left forearm, no other possible needle marks seen   Psychiatric: He has a normal mood and affect. His speech is normal and behavior is normal. His mood appears not anxious.  Nursing note and vitals reviewed.    ED Treatments / Results  Labs (all labs ordered are listed, but only abnormal results are displayed) Results for orders placed or performed during the hospital encounter of 05/24/16  Comprehensive metabolic panel  Result Value Ref Range   Sodium 141 135 - 145 mmol/L   Potassium 3.9 3.5 - 5.1 mmol/L   Chloride 109 101 - 111 mmol/L   CO2 20 (L) 22 - 32 mmol/L   Glucose, Bld 57 (L) 65 - 99 mg/dL   BUN 18 6 - 20 mg/dL   Creatinine, Ser 9.38 (H) 0.61 - 1.24 mg/dL   Calcium 9.2 8.9 - 10.1 mg/dL   Total Protein 6.8 6.5 - 8.1 g/dL   Albumin 3.9 3.5 - 5.0 g/dL   AST 28 15 - 41 U/L   ALT 36 17 - 63 U/L   Alkaline Phosphatase 44 38 - 126 U/L   Total Bilirubin 0.6 0.3 - 1.2 mg/dL   GFR calc non Af Amer 26 (L) >60 mL/min   GFR calc Af Amer 31 (L) >60 mL/min   Anion gap 12 5 - 15  Troponin I  Result Value Ref Range   Troponin I <0.03 <0.03 ng/mL  CBC with Differential  Result Value Ref Range   WBC 10.5 4.0 - 10.5 K/uL   RBC 4.67 4.22 - 5.81 MIL/uL   Hemoglobin 13.2 13.0 - 17.0 g/dL   HCT 75.1 02.5 - 85.2 %   MCV 84.8 78.0 - 100.0 fL   MCH 28.3 26.0 - 34.0 pg   MCHC 33.3 30.0 - 36.0 g/dL   RDW 77.8 24.2 - 35.3 %   Platelets 243 150 - 400 K/uL   Neutrophils Relative % 73 %   Neutro Abs 7.6 1.7 - 7.7 K/uL   Lymphocytes Relative 15 %   Lymphs Abs 1.5 0.7 - 4.0 K/uL   Monocytes Relative 11 %   Monocytes Absolute 1.2 (H) 0.1 -  1.0 K/uL   Eosinophils Relative 1 %   Eosinophils Absolute 0.2 0.0 - 0.7 K/uL   Basophils Relative 0 %   Basophils Absolute 0.0 0.0 - 0.1 K/uL  Urine rapid drug screen (hosp performed)  Result Value Ref Range   Opiates POSITIVE (A) NONE DETECTED   Cocaine NONE DETECTED NONE DETECTED   Benzodiazepines NONE DETECTED NONE DETECTED   Amphetamines NONE DETECTED NONE DETECTED   Tetrahydrocannabinol  POSITIVE (A) NONE DETECTED   Barbiturates NONE DETECTED NONE DETECTED  Urinalysis, Routine w reflex microscopic  Result Value Ref Range   Color, Urine YELLOW YELLOW   APPearance CLEAR CLEAR   Specific Gravity, Urine <1.005 (L) 1.005 - 1.030   pH 6.0 5.0 - 8.0   Glucose, UA NEGATIVE NEGATIVE mg/dL   Hgb urine dipstick NEGATIVE NEGATIVE   Bilirubin Urine NEGATIVE NEGATIVE   Ketones, ur NEGATIVE NEGATIVE mg/dL   Protein, ur NEGATIVE NEGATIVE mg/dL   Nitrite NEGATIVE NEGATIVE   Leukocytes, UA TRACE (A) NEGATIVE  Acetaminophen level  Result Value Ref Range   Acetaminophen (Tylenol), Serum <10 (L) 10 - 30 ug/mL  Salicylate level  Result Value Ref Range   Salicylate Lvl <7.0 2.8 - 30.0 mg/dL  Ethanol  Result Value Ref Range   Alcohol, Ethyl (B) <5 <5 mg/dL  CBC  Result Value Ref Range   WBC 11.1 (H) 4.0 - 10.5 K/uL   RBC 4.78 4.22 - 5.81 MIL/uL   Hemoglobin 13.4 13.0 - 17.0 g/dL   HCT 16.1 09.6 - 04.5 %   MCV 85.1 78.0 - 100.0 fL   MCH 28.0 26.0 - 34.0 pg   MCHC 32.9 30.0 - 36.0 g/dL   RDW 40.9 81.1 - 91.4 %   Platelets 227 150 - 400 K/uL  Urinalysis, Microscopic (reflex)  Result Value Ref Range   RBC / HPF 0-5 0 - 5 RBC/hpf   WBC, UA 0-5 0 - 5 WBC/hpf   Bacteria, UA RARE (A) NONE SEEN   Squamous Epithelial / LPF 0-5 (A) NONE SEEN  Osmolality  Result Value Ref Range   Osmolality 302 (H) 275 - 295 mOsm/kg  Troponin I (q 6hr x 3)  Result Value Ref Range   Troponin I <0.03 <0.03 ng/mL  I-Stat arterial blood gas, ED  Result Value Ref Range   pH, Arterial 7.258 (L) 7.350 - 7.450   pCO2 arterial 45.0 32.0 - 48.0 mmHg   pO2, Arterial 462.0 (H) 83.0 - 108.0 mmHg   Bicarbonate 20.4 20.0 - 28.0 mmol/L   TCO2 22 0 - 100 mmol/L   O2 Saturation 100.0 %   Acid-base deficit 7.0 (H) 0.0 - 2.0 mmol/L   Patient temperature 96.9 F    Collection site RADIAL, ALLEN'S TEST ACCEPTABLE    Drawn by RT    Sample type ARTERIAL   CBG monitoring, ED  Result Value Ref Range    Glucose-Capillary 107 (H) 65 - 99 mg/dL   Comment 1 Notify RN    Comment 2 Document in Chart   CBG monitoring, ED  Result Value Ref Range   Glucose-Capillary 105 (H) 65 - 99 mg/dL   Comment 1 Document in Chart    Laboratory interpretation all normal except Positive UDS for opiates and marijuana    EKG  EKG Interpretation  Date/Time:  Wednesday May 24 2016 23:02:43 EST Ventricular Rate:  88 PR Interval:    QRS Duration: 92 QT Interval:  380 QTC Calculation: 460 R Axis:   39 Text Interpretation:  Sinus  rhythm Consider left atrial enlargement Abnormal R-wave progression, early transition No significant change since last tracing 18 May 2016 Confirmed by [facility]  MD-I, Khayden Herzberg ([id number]) on 05/25/2016 12:28:48 AM       Radiology Ct Head Wo Contrast  Result Date: 05/25/2016 CLINICAL DATA:  Syncopal episode. Intubated. History of polysubstance abuse, recent heart attack. EXAM: CT HEAD WITHOUT CONTRAST TECHNIQUE: Contiguous axial images were obtained from the base of the skull through the vertex without intravenous contrast. COMPARISON:  CT HEAD September 25, 2015 FINDINGS: BRAIN: The ventricles and sulci are proportionally mildly prominent for age. No intraparenchymal hemorrhage, mass effect nor midline shift. Patchy supratentorial white matter hypodensities. No acute large vascular territory infarcts. Similar mildly prominent low-density cerebral spinal fluid space interhemispheric fissure. Basal cisterns are patent. VASCULAR: Moderate to severe calcific atherosclerosis of the carotid siphons. SKULL: No skull fracture. No significant scalp soft tissue swelling. SINUSES/ORBITS: The mastoid air-cells and included paranasal sinuses are well-aerated.The included ocular globes and orbital contents are non-suspicious. OTHER: Life-support lines in place. IMPRESSION: No acute intracranial process. Moderate to severe atherosclerosis. Mild parenchymal brain volume loss for age and mild atherosclerosis.  Electronically Signed   By: Awilda Metro M.D.   On: 05/25/2016 01:02   Ct Chest Wo Contrast  Result Date: 05/25/2016 CLINICAL DATA:  Abnormal chest x-ray. Suspected debris or mass in the right mainstem bronchus. EXAM: CT CHEST WITHOUT CONTRAST TECHNIQUE: Multidetector CT imaging of the chest was performed following the standard protocol without IV contrast. COMPARISON:  Chest radiograph 05/25/2016.  CT chest 11/25/2015 FINDINGS: Cardiovascular: Normal heart size. No pericardial effusion. Coronary artery calcifications. Normal caliber thoracic aorta. Mediastinum/Nodes: Endotracheal tube with tip above the carina. Enteric tube with tip in the stomach. Esophagus is decompressed. There is infiltration and edema in the soft tissues around the thyroid gland and in the thoracic inlet. This is incompletely included within the field of view. This could represent soft tissue hematoma, infection, or infiltration. Suggest CT neck for further evaluation. Lungs/Pleura: Trachea and bronchi appear clear in patent. There is atelectasis or consolidation in both lower lungs with air bronchograms. Small bilateral pleural effusions. No pneumothorax. Upper Abdomen: Prominent lymph nodes in the porta hepatis, nonspecific. No focal liver lesions appreciated. Spleen size is normal. Musculoskeletal: No chest wall mass or suspicious bone lesions identified. IMPRESSION: No evidence of right hilar or bronchial lesion. Atelectasis or consolidation in both lower lungs with small bilateral pleural effusions. Infiltration and edema in the soft tissues around the thyroid gland and in the thoracic inlet, incompletely included. This could represent soft tissue hematoma, infection, or other infiltrative process. Suggest CT neck for further evaluation. Electronically Signed   By: Burman Nieves M.D.   On: 05/25/2016 04:40   Dg Chest Port 1 View  Result Date: 05/25/2016 CLINICAL DATA:  Endotracheal tube placement.  Shortness of breath.  EXAM: PORTABLE CHEST 1 VIEW COMPARISON:  05/25/2016 FINDINGS: Endotracheal tube tip measures 3.9 cm above the carina. Enteric tube tip is coiled in the left upper quadrant consistent with location in the body of the stomach. Shallow inspiration with atelectasis in the lung bases. No focal consolidation. No blunting of costophrenic angles. No pneumothorax. Normal heart size and pulmonary vascularity. IMPRESSION: Appliances appear in satisfactory position. Shallow inspiration with atelectasis in the lung bases. Electronically Signed   By: Burman Nieves M.D.   On: 05/25/2016 05:32   Dg Chest Portable 1 View  Result Date: 05/25/2016 CLINICAL DATA:  Post intubation and orogastric tube placement EXAM: PORTABLE CHEST 1  VIEW COMPARISON:  Chest radiograph January 21, 2017 FINDINGS: Cardiomediastinal silhouette is normal for this low inspiratory examination with crowded vascular markings. Bandlike density RIGHT midlung zone. Soft tissue density projecting at RIGHT mainstem bronchus. Endotracheal tube tip projects 2.8 cm above the carina. Distal tip of nasogastric and side port projecting in the proximal stomach. No pleural effusion or focal consolidation. No pneumothorax. Soft tissue planes and included osseous structure nonsuspicious. IMPRESSION: Pulmonary vascular congestion, suspected debris or mass RIGHT mainstem bronchus. RIGHT midlung zone atelectasis. Endotracheal tube tip projects 2.8 cm above the carina. Distal tip of nasogastric and side port projecting in the proximal stomach. Electronically Signed   By: Awilda Metro M.D.   On: 05/25/2016 00:36   Dg Chest Port 1 View  Result Date: 05/24/2016 CLINICAL DATA:  Syncope, shortness of breath. History of polysubstance abuse, chronic kidney disease, hypertension. EXAM: PORTABLE CHEST 1 VIEW COMPARISON:  Chest radiograph May 18, 2016 FINDINGS: Cardiomediastinal silhouette is unremarkable for this low inspiratory examination with crowded engorged  vasculature markings. The lungs are clear without pleural effusions or focal consolidations. Trachea projects midline and there is no pneumothorax. Included soft tissue planes and osseous structures are non-suspicious. IMPRESSION: Pulmonary vascular congestion in this low inspiratory portable examination. Electronically Signed   By: Awilda Metro M.D.   On: 05/24/2016 23:31    Procedures Procedures (including critical care time)  INTUBATION Performed by: Ward Givens  Required items: required blood products, implants, devices, and special equipment available Patient identity confirmed: provided demographic data and hospital-assigned identification number Time out: Immediately prior to procedure a "time out" was called to verify the correct patient, procedure, equipment, support staff and site/side marked as required.  Indications: Altered Mental Status  Intubation method: Glidescope Laryngoscopy   Preoxygenation: 100 % BVM  Sedatives: 20 mg Etomidate Paralytic: 150 mg Succinylcholine  Tube Size: 8.0 cuffed  Post-procedure assessment: chest rise and ETCO2 monitor Breath sounds: equal and absent over the epigastrium Tube secured with: ETT holder Chest x-ray interpreted by radiologist and me.  Chest x-ray findings: endotracheal tube in appropriate position  Patient tolerated the procedure well with no immediate complications.   CRITICAL CARE Performed by: Rune Mendez L Abrahan Fulmore Total critical care time: 38 minutes Critical care time was exclusive of separately billable procedures and treating other patients. Critical care was necessary to treat or prevent imminent or life-threatening deterioration. Critical care was time spent personally by me on the following activities: development of treatment plan with patient and/or surrogate as well as nursing, discussions with consultants, evaluation of patient's response to treatment, examination of patient, obtaining history from patient or  surrogate, ordering and performing treatments and interventions, ordering and review of laboratory studies, ordering and review of radiographic studies, pulse oximetry and re-evaluation of patient's condition.   Medications Ordered in ED Medications  dextrose 10 % infusion (not administered)  naloxone (NARCAN) injection 1 mg (1 mg Intravenous Given 05/24/16 2306)  sodium chloride 0.9 % bolus 1,000 mL (0 mLs Intravenous Stopped 05/25/16 0120)  sodium chloride 0.9 % bolus 1,000 mL (0 mLs Intravenous Stopped 05/25/16 0110)  naloxone (NARCAN) injection 1 mg (1 mg Intravenous Given 05/24/16 2325)  etomidate (AMIDATE) injection (20 mg Intravenous Given 05/25/16 0016)  succinylcholine (ANECTINE) injection (150 mg Intravenous Given 05/25/16 0017)  dextrose 50 % solution 50 mL (50 mLs Intravenous Given 05/25/16 0030)  propofol (DIPRIVAN) 1000 MG/100ML infusion (30 mcg/kg/min  104.3 kg Intravenous Rate/Dose Change 05/25/16 0117)     Initial Impression / Assessment and Plan /  ED Course  I have reviewed the triage vital signs and the nursing notes.  Pertinent labs & imaging results that were available during my care of the patient were reviewed by me and considered in my medical decision making (see chart for details).  23:01 PM Pt was given narcan 1 mg IV. He was started on 2nd liter of NS bolus for hypotension.   Recheck 23:20 PM no response from the Narcan, repeat dose given.   12:17 AM Pt's mental status is getting worse, now does not respond to tactile stimulus. He was prepared for intubation.  12:20 AM pt has been intubated.  12:27 I noticed (I was not informed) his glucose was 57. He was given 1 amp of D50% and started on a D10 drip since he is NPO.  01:24 AM Dr Kendrick Fries, CCM will come see patient.   Patient was given 1 L of normal saline by EMS and was given another 2 L in the ED. His blood pressure did improve into the high 90s systolic range. Patient was given Narcan 1 mg twice without  improvement of his mental status when I checked him after each dose. Actually patient's mental status slowly got worse and now he is not responding to tactile stimulus. Patient was prepared for intubation prior to being taken to CT scan.  Patient has altered mental status of uncertain etiology.   Final Clinical Impressions(s) / ED Diagnoses   Final diagnoses:  Coma, unspecified coma depth, unspecified coma timing (HCC)  Hypoglycemia   Plan admitted  Devoria Albe, MD, Concha Pyo, MD 05/25/16 [phone]

## 2016-05-25 NOTE — ED Notes (Signed)
Attempted to insert foley catheter was not successful RN aware

## 2016-05-25 NOTE — Progress Notes (Signed)
CRITICAL VALUE ALERT  Critical value received:  Serum Osm  Date of notification:  05/25/16  Time of notification:  0600  Critical value read back:Yes.    Nurse who received alert:  Telford Nab RN  MD notified (1st page):  Cathlean Cower (Resident)  Time of first page:  0630  MD notified (2nd page):  Time of second page:  Responding MD:  Cathlean Cower (Resident)  Time MD responded:  0630

## 2016-05-25 NOTE — Progress Notes (Signed)
This CSW familiar with Patient from recent ED visit. Patient with significant psychiatric history and polysubstance abuse. Patient was seen in ED (05/18/16- 05/23/16) for suicidal ideation and depression due to homelessness. Patient discussed homelessness with 2nd ED CSW on 02/08. This CSW engaged with Patient on 02/09 at which time Patient reported that he receives disability income and could rent a room at a boarding home. CSW informed Patient of boarding home options in [place] [facility] [place] [facility] and provided Patient's RN with housing resources. This CSW also provided Patient's nurse with substance abuse resources including outpatient, intensive outpatient, and residential treatment resources. Patient was psychiatrically cleared on 05/23/2016 at which time he was discharged to the homeless shelter.   Patient admitted on 05/24/16 after "falling out" at the homeless shelter.Patient intubated due to worsening mental status and for airway protection. Altered mental status of unclear etiology- per H&P- likely from drug use (UDS positive for opiates and THC). CSW continues to follow.    Enos Fling, MSW, LCSW [facility] ED/14M Clinical Social Worker [phone]

## 2016-05-25 NOTE — Progress Notes (Addendum)
[facility] PULMONARY / CRITICAL CARE MEDICINE   Name: [name] MRN: [id number] DOB: 11-Mar-1961    ADMISSION DATE:  05/24/2016 CONSULTATION DATE:  05/25/2016  REFERRING MD:  Dr. Lynelle Doctor EDP  CHIEF COMPLAINT:  AMS  HISTORY OF PRESENT ILLNESS:   56 year old male with PMH as below, which is significant for CKD, HTN, Hepatitis C, OSA on CPAP, Bipolar disorder, Cocaine abuse, IVDA, and alcohol abuse.  EMS was called to homeless shelter to evaluate someone there, and while they were there, [name] (not the patient they were there to see) suddenly passed out "slumped to the floor". Initially after the event he was responsive, and was found to be hypotensive. In the ED he was minimally responsive, but was able to state that he had NOT been using heroin. He did not respond to narcan. His mental status continued to decline and he was intubated for airway protection. PCCM asked to admit.  SUBJECTIVE: Remains sedated and on vent. No acute events since admission.   No current facility-administered medications on file prior to encounter.    Current Outpatient Prescriptions on File Prior to Encounter  Medication Sig  . acetaminophen (TYLENOL) 500 MG tablet Take 500-1,000 mg by mouth 3 (three) times daily as needed (for pain).  . baclofen (LIORESAL) 10 MG tablet Take 10 mg by mouth 3 (three) times daily as needed for muscle spasms.  [facility] diltiazem (CARDIZEM CD) 240 MG 24 hr capsule Take 1 capsule (240 mg total) by mouth daily. (Patient not taking: Reported on 05/19/2016)  . gabapentin (NEURONTIN) 300 MG capsule Take 1 capsule (300 mg total) by mouth 3 (three) times daily. (Patient not taking: Reported on 05/19/2016)  . hydrocortisone (ANUSOL-HC) 2.5 % rectal cream Place 1 application rectally 2 (two) times daily.  . hydrOXYzine (ATARAX/VISTARIL) 25 MG tablet Take 1 tablet (25 mg total) by mouth 3 (three) times daily as needed for anxiety. (Patient not taking: Reported on 05/19/2016)  . ibuprofen (ADVIL,MOTRIN) 200 MG  tablet Take 1,000 mg by mouth every 4 (four) hours as needed for moderate pain.  [facility] lamoTRIgine (LAMICTAL) 25 MG tablet Take 25 mg by mouth at bedtime.  . lidocaine (LIDODERM) 5 % Place 1 patch onto the skin daily. Remove & Discard patch within 12 hours or as directed by MD  . lisinopril (PRINIVIL,ZESTRIL) 40 MG tablet Take 40 mg by mouth daily.  . pantoprazole (PROTONIX) 40 MG tablet Take 1 tablet (40 mg total) by mouth daily.  . QUEtiapine (SEROQUEL) 400 MG tablet Take 400 mg by mouth at bedtime.  . tamsulosin (FLOMAX) 0.4 MG CAPS capsule Take 1 capsule (0.4 mg total) by mouth daily. (Patient not taking: Reported on 05/19/2016)  . topiramate (TOPAMAX) 25 MG tablet Take 25 mg by mouth 2 (two) times daily.  . traZODone (DESYREL) 100 MG tablet Take 1 tablet (100 mg total) by mouth at bedtime as needed for sleep. (Patient not taking: Reported on 05/19/2016)   FAMILY HISTORY:  His indicated that the status of his mother is unknown. He indicated that the status of his sister is unknown.   SOCIAL HISTORY: He  reports that he has been smoking.  He has a 5.52 pack-year smoking history. He has never used smokeless tobacco. He reports that he drinks alcohol. He reports that he uses drugs, including Marijuana, "Crack" cocaine, and Cocaine.  VITAL SIGNS: BP (!) 88/64   Pulse 77   Temp 97.5 F (36.4 C) (Oral)   Resp 20   Ht 5\' 9"  (  1.753 m)   Wt 230 lb 13.2 oz (104.7 kg)   SpO2 100%   BMI 34.09 kg/m   HEMODYNAMICS:    VENTILATOR SETTINGS: Vent Mode: PRVC FiO2 (%):  [40 %-100 %] 40 % Set Rate:  [15 bmp-20 bmp] 20 bmp Vt Set:  [600 mL] 600 mL PEEP:  [5 cmH20] 5 cmH20 Plateau Pressure:  [15 cmH20-18 cmH20] 15 cmH20  INTAKE / OUTPUT: I/O last 3 completed shifts: In: 2318.6 [I.V.:318.6; IV Piggyback:2000] Out: 350 [Urine:350]  PHYSICAL EXAMINATION: General:  Obese male in NAD on vent Neuro:  Sedated, does not open eyes or respond to commands HEENT:  [place]/AT, PERRL, no JVD Cardiovascular:  RRR,  no MRG Lungs:  Clear, bilateral breath sounds Abdomen:  Soft, non-distended Musculoskeletal:  No acute deformity or ROM limitation Skin:  Grossly intact  LABS:  BMET  Recent Labs Lab 05/18/16 1529 05/20/16 1105 05/24/16 2304 05/25/16 0339  NA 140  --  141 142  K 3.4* 3.3* 3.9 4.4  CL 104  --  109 112*  CO2 25  --  20* 21*  BUN 9  --  18 15  CREATININE 0.92  --  2.57* 1.75*  GLUCOSE 86  --  57* 90   Electrolytes  Recent Labs Lab 05/18/16 1529 05/24/16 2304 05/25/16 0339  CALCIUM 9.5 9.2 8.8*  MG  --   --  1.9  PHOS  --   --  4.4    CBC  Recent Labs Lab 05/18/16 1529 05/24/16 2304 05/25/16 0339  WBC 7.7 10.5 11.1*  HGB 13.7 13.2 13.4  HCT 40.9 39.6 40.7  PLT 265 243 227    Coags No results for input(s): APTT, INR in the last 168 hours.  Sepsis Markers No results for input(s): LATICACIDVEN, PROCALCITON, O2SATVEN in the last 168 hours.  ABG  Recent Labs Lab 05/25/16 0137 05/25/16 0332  PHART 7.258* 7.408  PCO2ART 45.0 29.0*  PO2ART 462.0* 148.0*    Liver Enzymes  Recent Labs Lab 05/18/16 1529 05/24/16 2304  AST 49* 28  ALT 60 36  ALKPHOS 44 44  BILITOT 0.6 0.6  ALBUMIN 3.9 3.9    Cardiac Enzymes  Recent Labs Lab 05/24/16 2304 05/25/16 0354  TROPONINI <0.03 <0.03    Glucose  Recent Labs Lab 05/25/16 0152 05/25/16 0400 05/25/16 0452 05/25/16 0800  GLUCAP 107* 105* 112* 86    Imaging Ct Head Wo Contrast  Result Date: 05/25/2016 CLINICAL DATA:  Syncopal episode. Intubated. History of polysubstance abuse, recent heart attack. EXAM: CT HEAD WITHOUT CONTRAST TECHNIQUE: Contiguous axial images were obtained from the base of the skull through the vertex without intravenous contrast. COMPARISON:  CT HEAD September 25, 2015 FINDINGS: BRAIN: The ventricles and sulci are proportionally mildly prominent for age. No intraparenchymal hemorrhage, mass effect nor midline shift. Patchy supratentorial white matter hypodensities. No acute large  vascular territory infarcts. Similar mildly prominent low-density cerebral spinal fluid space interhemispheric fissure. Basal cisterns are patent. VASCULAR: Moderate to severe calcific atherosclerosis of the carotid siphons. SKULL: No skull fracture. No significant scalp soft tissue swelling. SINUSES/ORBITS: The mastoid air-cells and included paranasal sinuses are well-aerated.The included ocular globes and orbital contents are non-suspicious. OTHER: Life-support lines in place. IMPRESSION: No acute intracranial process. Moderate to severe atherosclerosis. Mild parenchymal brain volume loss for age and mild atherosclerosis. Electronically Signed   By: Awilda Metro M.D.   On: 05/25/2016 01:02   Ct Chest Wo Contrast  Result Date: 05/25/2016 CLINICAL DATA:  Abnormal chest x-ray. Suspected debris  or mass in the right mainstem bronchus. EXAM: CT CHEST WITHOUT CONTRAST TECHNIQUE: Multidetector CT imaging of the chest was performed following the standard protocol without IV contrast. COMPARISON:  Chest radiograph 05/25/2016.  CT chest 11/25/2015 FINDINGS: Cardiovascular: Normal heart size. No pericardial effusion. Coronary artery calcifications. Normal caliber thoracic aorta. Mediastinum/Nodes: Endotracheal tube with tip above the carina. Enteric tube with tip in the stomach. Esophagus is decompressed. There is infiltration and edema in the soft tissues around the thyroid gland and in the thoracic inlet. This is incompletely included within the field of view. This could represent soft tissue hematoma, infection, or infiltration. Suggest CT neck for further evaluation. Lungs/Pleura: Trachea and bronchi appear clear in patent. There is atelectasis or consolidation in both lower lungs with air bronchograms. Small bilateral pleural effusions. No pneumothorax. Upper Abdomen: Prominent lymph nodes in the porta hepatis, nonspecific. No focal liver lesions appreciated. Spleen size is normal. Musculoskeletal: No chest wall  mass or suspicious bone lesions identified. IMPRESSION: No evidence of right hilar or bronchial lesion. Atelectasis or consolidation in both lower lungs with small bilateral pleural effusions. Infiltration and edema in the soft tissues around the thyroid gland and in the thoracic inlet, incompletely included. This could represent soft tissue hematoma, infection, or other infiltrative process. Suggest CT neck for further evaluation. Electronically Signed   By: Burman Nieves M.D.   On: 05/25/2016 04:40   Dg Chest Port 1 View  Result Date: 05/25/2016 CLINICAL DATA:  Endotracheal tube placement.  Shortness of breath. EXAM: PORTABLE CHEST 1 VIEW COMPARISON:  05/25/2016 FINDINGS: Endotracheal tube tip measures 3.9 cm above the carina. Enteric tube tip is coiled in the left upper quadrant consistent with location in the body of the stomach. Shallow inspiration with atelectasis in the lung bases. No focal consolidation. No blunting of costophrenic angles. No pneumothorax. Normal heart size and pulmonary vascularity. IMPRESSION: Appliances appear in satisfactory position. Shallow inspiration with atelectasis in the lung bases. Electronically Signed   By: Burman Nieves M.D.   On: 05/25/2016 05:32   Dg Chest Portable 1 View  Result Date: 05/25/2016 CLINICAL DATA:  Post intubation and orogastric tube placement EXAM: PORTABLE CHEST 1 VIEW COMPARISON:  Chest radiograph January 21, 2017 FINDINGS: Cardiomediastinal silhouette is normal for this low inspiratory examination with crowded vascular markings. Bandlike density RIGHT midlung zone. Soft tissue density projecting at RIGHT mainstem bronchus. Endotracheal tube tip projects 2.8 cm above the carina. Distal tip of nasogastric and side port projecting in the proximal stomach. No pleural effusion or focal consolidation. No pneumothorax. Soft tissue planes and included osseous structure nonsuspicious. IMPRESSION: Pulmonary vascular congestion, suspected debris or mass  RIGHT mainstem bronchus. RIGHT midlung zone atelectasis. Endotracheal tube tip projects 2.8 cm above the carina. Distal tip of nasogastric and side port projecting in the proximal stomach. Electronically Signed   By: Awilda Metro M.D.   On: 05/25/2016 00:36   Dg Chest Port 1 View  Result Date: 05/24/2016 CLINICAL DATA:  Syncope, shortness of breath. History of polysubstance abuse, chronic kidney disease, hypertension. EXAM: PORTABLE CHEST 1 VIEW COMPARISON:  Chest radiograph May 18, 2016 FINDINGS: Cardiomediastinal silhouette is unremarkable for this low inspiratory examination with crowded engorged vasculature markings. The lungs are clear without pleural effusions or focal consolidations. Trachea projects midline and there is no pneumothorax. Included soft tissue planes and osseous structures are non-suspicious. IMPRESSION: Pulmonary vascular congestion in this low inspiratory portable examination. Electronically Signed   By: Awilda Metro M.D.   On: 05/24/2016 23:31  STUDIES:  CXR 2/14 > Pulmonary vascular congestion, suspected debris or mass R mainstem bronchus. R midlung zone ztelectasis.  CT head 2/15 > No acute abnormalities  CT chest 2/15 > No evidence of R hilar or bronchial lesion. Atelectasis or consolidation in both lower lungs with small bilateral effusions. Infiltration and edema in the soft tissues around the thyroid gland and in the thoracic inlet, incompletely included. This could represent soft tissue hematoma, infection, or other infiltrative process. Suggest CT neck for further evaluation.  CXR 2/15 > Shallow inspiration with atelectasis in the lung bases.   CULTURES: Blood cx 2/14 > pending Urine culture 2/15 > pending  ANTIBIOTICS: None  SIGNIFICANT EVENTS: 2/14 > admit  LINES/TUBES: ETT 2/14 >   ASSESSMENT / PLAN:  PULMONARY A: Inability to protect airway due to encephalopathy OSA on CPAP, doubt complaint as he is reportedly homeless. Tobacco  abuse  P:   Full vent support VAP bundle Consider CT neck  CARDIOVASCULAR A:  Hypotension, suspect secondary to hypovolemia H/o HTN  P:  Telemetry Continue IVF resuscitation  May need CVL/Pressors  RENAL A:   AKI - improving.  CKD NAG acidosis - improving.   P:   Follow BMP Hydrate  GASTROINTESTINAL A:   Hepatitis C ETOH abuse  P:   NPO Protonix for SUP LFTs wnl  HEMATOLOGIC A:   No acute issues  P:  Follow CBC  INFECTIOUS A:   No acute issues  P:   Follow WBC and fever curve  ENDOCRINE A:   Hypoglycemia - resolved.   P:   CBGs  NEUROLOGIC A:   Acute metabolic vs toxic encephalopathy Polysubstance abuse history (UDS positive for opiates and THC)  P:   RASS goal: 0 to -1 Propofol gtt  Tarri Abernethy, MD, MPH PGY-2 [facility]  Attending Note:  56 year old male with IVDA who lost consciousness on 2/14, unsure if was drug related.  Narcan was not effective and patient was intubated for airway protection.  This AM, patient is awake on propofol and following commands with clear lungs.  I reviewed CXR myself, ETT ok and no active disease.  Will d/c sedation and extubate today.  PT/OT, OOB and titrate O2 for sat of 88-92%.  Hold in the ICU post extubation.  The patient is critically ill with multiple organ systems failure and requires high complexity decision making for assessment and support, frequent evaluation and titration of therapies, application of advanced monitoring technologies and extensive interpretation of multiple databases.   Critical Care Time devoted to patient care services described in this note is  55  Minutes. This time reflects time of care of this signee Dr Koren Bound. This critical care time does not reflect procedure time, or teaching time or supervisory time of PA/NP/Med student/Med Resident etc but could involve care discussion time.  Alyson Reedy, M.D. [facility] Pulmonary/Critical Care  Medicine. Pager: [phone]. After hours pager: [phone].

## 2016-05-25 NOTE — Progress Notes (Signed)
Hypoglycemic Event  CBG: 61  Treatment: 6 oz Orange Juice  Symptoms: None (Drowsy)  Follow-up CBG: Time:1255 CBG Result:71  Possible Reasons for Event: Inadequate meal intake  Comments/MD notified:Dr Molli Knock; RN will administer 25cc D50 Per protocol    Darrel Hoover

## 2016-05-26 ENCOUNTER — Inpatient Hospital Stay (HOSPITAL_COMMUNITY): Payer: Medicare Other

## 2016-05-26 DIAGNOSIS — N189 Chronic kidney disease, unspecified: Secondary | ICD-10-CM

## 2016-05-26 DIAGNOSIS — G4733 Obstructive sleep apnea (adult) (pediatric): Secondary | ICD-10-CM

## 2016-05-26 DIAGNOSIS — I951 Orthostatic hypotension: Secondary | ICD-10-CM

## 2016-05-26 DIAGNOSIS — R55 Syncope and collapse: Secondary | ICD-10-CM

## 2016-05-26 DIAGNOSIS — N179 Acute kidney failure, unspecified: Secondary | ICD-10-CM

## 2016-05-26 DIAGNOSIS — Z992 Dependence on renal dialysis: Secondary | ICD-10-CM

## 2016-05-26 LAB — GLUCOSE, CAPILLARY
Glucose-Capillary: 120 mg/dL — ABNORMAL HIGH (ref 65–99)
Glucose-Capillary: 82 mg/dL (ref 65–99)
Glucose-Capillary: 86 mg/dL (ref 65–99)
Glucose-Capillary: 102 mg/dL — ABNORMAL HIGH (ref 65–99)
Glucose-Capillary: 107 mg/dL — ABNORMAL HIGH (ref 65–99)

## 2016-05-26 LAB — BASIC METABOLIC PANEL
Anion gap: 6 (ref 5–15)
BUN: 6 mg/dL (ref 6–20)
CO2: 21 mmol/L — ABNORMAL LOW (ref 22–32)
Calcium: 8.6 mg/dL — ABNORMAL LOW (ref 8.9–10.3)
Chloride: 110 mmol/L (ref 101–111)
Creatinine, Ser: 0.91 mg/dL (ref 0.61–1.24)
GFR calc Af Amer: >60 mL/min (ref >60)
GFR calc non Af Amer: >60 mL/min (ref >60)
Glucose, Bld: 131 mg/dL — ABNORMAL HIGH (ref 65–99)
Potassium: 3.5 mmol/L (ref 3.5–5.1)
Sodium: 137 mmol/L (ref 135–145)

## 2016-05-26 LAB — CBC
HCT: 38.2 % — ABNORMAL LOW (ref 39.0–52.0)
Hemoglobin: 12.7 g/dL — ABNORMAL LOW (ref 13.0–17.0)
MCH: 27.7 pg (ref 26.0–34.0)
MCHC: 33.2 g/dL (ref 30.0–36.0)
MCV: 83.4 fL (ref 78.0–100.0)
Platelets: 210 10*3/uL (ref 150–400)
RBC: 4.58 MIL/uL (ref 4.22–5.81)
RDW: 15 % (ref 11.5–15.5)
WBC: 7.5 10*3/uL (ref 4.0–10.5)

## 2016-05-26 LAB — PHOSPHORUS: Phosphorus: 2.8 mg/dL (ref 2.5–4.6)

## 2016-05-26 LAB — URINE CULTURE: Culture: 30000 — AB

## 2016-05-26 LAB — MAGNESIUM: Magnesium: 1.5 mg/dL — ABNORMAL LOW (ref 1.7–2.4)

## 2016-05-26 MED ORDER — MAGNESIUM SULFATE 2 GM/50ML IV SOLN
2.0000 g | Freq: Once | INTRAVENOUS | Status: AC
  Administered 2016-05-26: 2 g via INTRAVENOUS
  Filled 2016-05-26: qty 50

## 2016-05-26 MED ORDER — PANTOPRAZOLE SODIUM 40 MG PO TBEC
40.0000 mg | DELAYED_RELEASE_TABLET | ORAL | Status: DC
  Administered 2016-05-26: 40 mg via ORAL
  Filled 2016-05-26: qty 1

## 2016-05-26 MED ORDER — QUETIAPINE FUMARATE 400 MG PO TABS
400.0000 mg | ORAL_TABLET | Freq: Every day | ORAL | Status: DC
  Administered 2016-05-26: 400 mg via ORAL
  Filled 2016-05-26: qty 1

## 2016-05-26 NOTE — Progress Notes (Signed)
Bedside EEG completed, results pending. 

## 2016-05-26 NOTE — Procedures (Signed)
CLINICAL HISTORY This is a 56yo M with a reported syncopal episode.  There is a concern for possible seizure as a cause.  He is taking no relevant medications.  TECHNIQUE This is a routine inpatient EEG done with standard technique.  He is listed as being awake and drowsy during the recording.  No activating procedures were done.  DESCRIPTION Background shows mixed 7-8 Hz theta-alpha rhythm.  In addition, there is some intermixed frontal beta activity.  The background is slightly reduced in amplitude, though it is fairly well-formed.  Artifact is seen from muscle/movement at times.  There is also occasional generalized slowing seen suggestive of drowsiness.  IMPRESSION This is an abnormal EEG due to mild generalized slowing.  INTERPRETATION The slowing is suggestive of a mild encephalopathy, but is not specific to a cause.  In addition the frontal beta activity may be from medication effect.  Overall, there is no evidence for any focal, lateralizing or epileptiform activity.  However, this does not completely exclude the possibility of a seizure.   Dr. Benedict Needy [facility] [phone]  05/26/2016, 6:36 PM

## 2016-05-26 NOTE — Progress Notes (Signed)
CSW received T/C from [employer] Community Support Team in reference to a referral made by behavioral health. Mr. Janee Morn requesting that Patient contact him at [phone] as soon as possible. Unit secretary made aware as Patient in procedure and will give message to RN as well.    Enos Fling, MSW, LCSW [facility] ED/77M Clinical Social Worker [phone]

## 2016-05-26 NOTE — Progress Notes (Signed)
[facility] PULMONARY / CRITICAL CARE MEDICINE   Name: [name] MRN: [id number] DOB: [birth year]    ADMISSION DATE:  05/24/2016 CONSULTATION DATE:  05/25/2016  REFERRING MD:  Dr. Lynelle Doctor EDP  CHIEF COMPLAINT:  AMS  HISTORY OF PRESENT ILLNESS:   56 year old male with PMH as below, which is significant for CKD, HTN, Hepatitis C, OSA on CPAP, Bipolar disorder, Cocaine abuse, IVDA, and alcohol abuse.  EMS was called to homeless shelter to evaluate someone there, and while they were there, Mr. Morrone (not the patient they were there to see) suddenly passed out "slumped to the floor". Initially after the event he was responsive, and was found to be hypotensive. In the ED he was minimally responsive, but was able to state that he had NOT been using heroin. He did not respond to narcan. His mental status continued to decline and he was intubated for airway protection. PCCM asked to admit.  SUBJECTIVE: Successfully extubated yesterday. Alert and oriented today. Asking what happened to him.   VITAL SIGNS: BP 132/84   Pulse 83   Temp 98.2 F (36.8 C) (Oral)   Resp (!) 21   Ht 5\' 9"  (1.753 m)   Wt 228 lb 13.4 oz (103.8 kg)   SpO2 100%   BMI 33.79 kg/m   HEMODYNAMICS:   INTAKE / OUTPUT: I/O last 3 completed shifts: In: 4085.9 [I.V.:2085.9; IV Piggyback:2000] Out: 2900 [Urine:2900]  PHYSICAL EXAMINATION: General:  Obese male in NAD Neuro:  Alert and oriented X4 HEENT:  [place]/AT, PERRL, no JVD Cardiovascular:  RRR, no MRG Lungs:  CTAB, no wheezes or ronchi Abdomen:  Soft, non-distended Musculoskeletal:  No acute deformity or ROM limitation Skin:  Grossly intact  LABS:  BMET  Recent Labs Lab 05/24/16 2304 05/25/16 0339 05/26/16 0253  NA 141 142 137  K 3.9 4.4 3.5  CL 109 112* 110  CO2 20* 21* 21*  BUN 18 15 6   CREATININE 2.57* 1.75* 0.91  GLUCOSE 57* 90 131*   Electrolytes  Recent Labs Lab 05/24/16 2304 05/25/16 0339 05/26/16 0253  CALCIUM 9.2 8.8* 8.6*  MG  --  1.9 1.5*   PHOS  --  4.4 2.8    CBC  Recent Labs Lab 05/24/16 2304 05/25/16 0339 05/26/16 0253  WBC 10.5 11.1* 7.5  HGB 13.2 13.4 12.7*  HCT 39.6 40.7 38.2*  PLT 243 227 210    Coags No results for input(s): APTT, INR in the last 168 hours.  Sepsis Markers No results for input(s): LATICACIDVEN, PROCALCITON, O2SATVEN in the last 168 hours.  ABG  Recent Labs Lab 05/25/16 0137 05/25/16 0332  PHART 7.258* 7.408  PCO2ART 45.0 29.0*  PO2ART 462.0* 148.0*    Liver Enzymes  Recent Labs Lab 05/24/16 2304  AST 28  ALT 36  ALKPHOS 44  BILITOT 0.6  ALBUMIN 3.9    Cardiac Enzymes  Recent Labs Lab 05/25/16 0354 05/25/16 1149 05/25/16 1446  TROPONINI <0.03 <0.03 <0.03    Glucose  Recent Labs Lab 05/25/16 1255 05/25/16 1429 05/25/16 1540 05/25/16 1936 05/25/16 2333 05/26/16 0338  GLUCAP 71 86 70 124* 106* 120*    Imaging No results found.  STUDIES:  CXR 2/14 > Pulmonary vascular congestion, suspected debris or mass R mainstem bronchus. R midlung zone ztelectasis.  CT head 2/15 > No acute abnormalities  CT chest 2/15 > No evidence of R hilar or bronchial lesion. Atelectasis or consolidation in both lower lungs with small bilateral effusions. Infiltration and edema in the soft  tissues around the thyroid gland and in the thoracic inlet, incompletely included. This could represent soft tissue hematoma, infection, or other infiltrative process. Suggest CT neck for further evaluation.  CXR 2/15 > Shallow inspiration with atelectasis in the lung bases.   CULTURES: Blood cx 2/14 > pending Urine culture 2/15 > pending  ANTIBIOTICS: None  SIGNIFICANT EVENTS: 2/14 > admit  LINES/TUBES: ETT 2/14 > 22/15   ASSESSMENT / PLAN:  PULMONARY A: Inability to protect airway due to encephalopathy OSA on CPAP, doubt compliant as he is reportedly homeless. Tobacco abuse  P:   Supplemental O2 PRN  CARDIOVASCULAR A:  Hypotension, suspect secondary to hypovolemia -  resolved.  H/o HTN  P:  Telemetry  RENAL A:   AKI - resolved.  CKD  P:   Follow BMP Replete Mag  GASTROINTESTINAL A:   Hepatitis C ETOH abuse  P:   LFTs wnl Regular diet  HEMATOLOGIC A:   No acute issues  P:  Follow CBC  INFECTIOUS A:   No acute issues  P:   Follow WBC and fever curve  ENDOCRINE A:   Hypoglycemia    P:   CBGs  NEUROLOGIC A:   Acute metabolic vs toxic encephalopathy Polysubstance abuse history (UDS positive for opiates and THC)  P:   Off sedation  Tarri Abernethy, MD, MPH PGY-2 [facility]

## 2016-05-26 NOTE — Progress Notes (Signed)
CSW engaged with Patient at his bedside. CSW discussed homelessness and substance abuse. Patient is adamant that he did not use post discharge from ED on 05/23/2016. Patient reports that he is unsure what happened to result in him having to be intubated. Patient reports that he met with Legrand Como at [employer] who has agreed to help Patient with temporary housing until Patient can receive his next check on the 1st of March. Patient reports that he confirmed with [facility] and they still have his belongings. Patient would like another copy of resources provided on 05/18/2016. Resources provided. Patient denies any further resources or assistance.    Lorrine Kin, MSW, LCSW [facility] ED/26M Clinical Social Worker [phone]

## 2016-05-26 NOTE — Evaluation (Signed)
Occupational Therapy Evaluation and Discharge Patient Details Name: [name] MRN: [id number] DOB: 01-15-1961 Today's Date: 05/26/2016    History of Present Illness 56 year old male with PMH as below, which is significant for CKD, HTN, Hepatitis C, OSA on CPAP, Bipolar disorder, Cocaine abuse, IVDA, and alcohol abuse.  Pt suddenly passed out "slumped to the floor" and hypotensive. His mental status continued to decline and he was intubated 2/14-2/15.   Clinical Impression   Pt reports he was independent with ADL PTA. Currently pt overall min guard for ADL and functional mobility. Educated pt energy conservation, fall prevention with dizziness after positional changes, and gradually increasing activity upon d/c. Pt with increased secretions; encouraged expelling secretions if able and demonstrated use of suction. VSS throughout session. Pt planning to return to the shelter upon d/c. No further acute OT needs identified; signing off at this time. Please re-consult if needs change. Thank you for this referral.    Follow Up Recommendations  No OT follow up;Supervision - Intermittent    Equipment Recommendations  None recommended by OT    Recommendations for Other Services PT consult     Precautions / Restrictions Precautions Precautions: Fall Restrictions Weight Bearing Restrictions: No      Mobility Bed Mobility Overal bed mobility: Needs Assistance Bed Mobility: Supine to Sit     Supine to sit: Supervision     General bed mobility comments: HOB flat without use of rails. Increased time required with mild dizziness once in sitting  Transfers Overall transfer level: Needs assistance Equipment used: None Transfers: Sit to/from Stand Sit to Stand: Min guard         General transfer comment: Min guard for safety. No unsteadiness or LOB noted. No dizziness reported    Balance Overall balance assessment: Needs assistance Sitting-balance support: Feet supported;No  upper extremity supported Sitting balance-Leahy Scale: Normal     Standing balance support: No upper extremity supported;During functional activity Standing balance-Leahy Scale: Good                              ADL Overall ADL's : Needs assistance/impaired Eating/Feeding: Independent;Sitting   Grooming: Min guard;Standing;Wash/dry hands   Upper Body Bathing: Set up;Supervision/ safety;Sitting   Lower Body Bathing: Min guard;Sit to/from stand   Upper Body Dressing : Set up;Supervision/safety;Sitting Upper Body Dressing Details (indicate cue type and reason): to don hospital gown Lower Body Dressing: Min guard;Sit to/from stand Lower Body Dressing Details (indicate cue type and reason): Pt able to don socks sitting EOB Toilet Transfer: Min guard;Ambulation;Regular Social worker and Hygiene: Min guard;Sit to/from stand   Tub/ Shower Transfer: Min guard;Tub transfer;Ambulation Tub/Shower Transfer Details (indicate cue type and reason): Simulated tub transfer Functional mobility during ADLs: Min guard General ADL Comments: VSS throughout. Increased dizziness with supine>sit, resolved within 30 seconds. Educated pt on energy conservation strategies and fall prevention with dizziness.     Vision Vision Assessment?: No apparent visual deficits   Perception     Praxis      Pertinent Vitals/Pain Pain Assessment: No/denies pain     Hand Dominance Right   Extremity/Trunk Assessment Upper Extremity Assessment Upper Extremity Assessment: Overall WFL for tasks assessed   Lower Extremity Assessment Lower Extremity Assessment: Defer to PT evaluation   Cervical / Trunk Assessment Cervical / Trunk Assessment: Normal   Communication Communication Communication: No difficulties   Cognition Arousal/Alertness: Awake/alert Behavior During Therapy: [facility] for  tasks assessed/performed Overall Cognitive Status: Within Functional Limits for tasks  assessed                     General Comments       Exercises       Shoulder Instructions      Home Living Family/patient expects to be discharged to:: Shelter/Homeless                                        Prior Functioning/Environment Level of Independence: Independent        Comments: Reports he is supposed to be using a cane due to L LE arthritis, has not had one for the past ~6 months.        OT Problem List:     OT Treatment/Interventions:      OT Goals(Current goals can be found in the care plan section) Acute Rehab OT Goals Patient Stated Goal: return to shelter OT Goal Formulation: All assessment and education complete, DC therapy  OT Frequency:     Barriers to D/C:            Co-evaluation              End of Session Equipment Utilized During Treatment: Gait belt Nurse Communication: Mobility status  Activity Tolerance: Patient tolerated treatment well Patient left: in chair;with call bell/phone within reach;with chair alarm set   Time: [phone] OT Time Calculation (min): 28 min Charges:  OT General Charges $OT Visit: 1 Procedure OT Evaluation $OT Eval Moderate Complexity: 1 Procedure OT Treatments $Self Care/Home Management : 8-22 mins G-Codes:     Gaye Alken M.S., OTR/L Pager: [phone]  05/26/2016, 8:55 AM

## 2016-05-26 NOTE — Evaluation (Signed)
Physical Therapy Evaluation Patient Details Name: [name] MRN: [id number] DOB: 02-Dec-1960 Today's Date: 05/26/2016   History of Present Illness  56 year old male with PMH as below, which is significant for CKD, HTN, Hepatitis C, OSA on CPAP, Bipolar disorder, Cocaine abuse, IVDA, and alcohol abuse.  Pt suddenly passed out "slumped to the floor" and hypotensive. His mental status continued to decline and he was intubated 2/14-2/15.  Clinical Impression  Pt very pleasant, moving well and able to ambulate long hall distance, perform stairs and basic transfers all without physical assist. Pt able to perform at baseline functional status with use of cane and recommend continued cane use at baseline. All education completed and no further therapy needs at this time, pt aware and agreeable. Recommend daily ambulation with nursing acutely.  VSS throughout on RA.     Follow Up Recommendations No PT follow up    Equipment Recommendations  Cane    Recommendations for Other Services       Precautions / Restrictions Precautions Precautions: Fall Restrictions Weight Bearing Restrictions: No      Mobility  Bed Mobility Overal bed mobility: Needs Assistance Bed Mobility: Supine to Sit     Supine to sit: Modified independent (Device/Increase time)     General bed mobility comments: HOB flat without use of rails.   Transfers Overall transfer level: Needs assistance Equipment used: None Transfers: Sit to/from Stand Sit to Stand: Supervision         General transfer comment: supervision for lines and safety only  Ambulation/Gait Ambulation/Gait assistance: Supervision Ambulation Distance (Feet): 400 Feet Assistive device: Straight cane Gait Pattern/deviations: Step-through pattern;Decreased stride length   Gait velocity interpretation: at or above normal speed for age/gender General Gait Details: cues for cane use and sequence, pt successfully able to self-regulate and  maintain balance with use of cane, pt able to return demonstrate proper cane use  Stairs Stairs: Yes Stairs assistance: Supervision Stair Management: Step to pattern;Forwards;With cane;One rail Right Number of Stairs: 6 General stair comments: pt educated for stair sequence with cane and rail, pt able to return demonstrate with supervision for lines but no necessary continued supervision  Wheelchair Mobility    Modified Rankin (Stroke Patients Only)       Balance Overall balance assessment: Needs assistance Sitting-balance support: Feet supported;No upper extremity supported Sitting balance-Leahy Scale: Normal     Standing balance support: No upper extremity supported;During functional activity Standing balance-Leahy Scale: Good                               Pertinent Vitals/Pain Pain Assessment: No/denies pain    Home Living Family/patient expects to be discharged to:: Shelter/Homeless                      Prior Function Level of Independence: Independent         Comments: Reports he is supposed to be using a cane due to L LE arthritis, has not had one for the past ~6 months.     Hand Dominance   Dominant Hand: Right    Extremity/Trunk Assessment   Upper Extremity Assessment Upper Extremity Assessment: Defer to OT evaluation    Lower Extremity Assessment Lower Extremity Assessment: Overall WFL for tasks assessed    Cervical / Trunk Assessment Cervical / Trunk Assessment: Normal  Communication   Communication: No difficulties  Cognition Arousal/Alertness: Awake/alert Behavior During Therapy: WFL for tasks  assessed/performed Overall Cognitive Status: Within Functional Limits for tasks assessed                      General Comments      Exercises     Assessment/Plan    PT Assessment Patent does not need any further PT services  PT Problem List            PT Treatment Interventions      PT Goals (Current goals  can be found in the Care Plan section)  Acute Rehab PT Goals Patient Stated Goal: return to shelter PT Goal Formulation: All assessment and education complete, DC therapy    Frequency     Barriers to discharge        Co-evaluation               End of Session Equipment Utilized During Treatment: Gait belt Activity Tolerance: Patient tolerated treatment well Patient left: in chair;with call bell/phone within reach Nurse Communication: Mobility status         Time: [id number] PT Time Calculation (min) (ACUTE ONLY): 20 min   Charges:   PT Evaluation $PT Eval Low Complexity: 1 Procedure     PT G Codes:        [name] June 21, 2016, 11:45 AM  [name], PT [phone]

## 2016-05-26 NOTE — Progress Notes (Addendum)
[facility] Attending Note  Presenting HPI:  56 y.o. male with known history of chronic renal failure, hypertension, and sleep apnea on CPAP. Patient also has known history of cocaine, alcohol, and IV drug abuse. EMS was called to evaluate another resident of the homeless shelter and upon arrival the patient suddenly had syncopal event and "slumped to the floor". Patient was unresponsive and hypotensive post this event. Narcan was administered without response. Patient's mental status continued to decline and he was intubated for airway protection. After admission to the ICU patient was successfully extubated same day.  Subjective:  No acute events overnight. Patient reports he had weakness in his left leg prior to his syncopal event. Denies any chest pain, pressure, or palpitations preceding worse since his admission. Denies any headache or vision changes. Denies any focal weakness, numbness, or tingling.  Review of Systems:  Denies no dyspnea or cough. No subjective fever or chills. No abdominal pain or nausea.  Temp:  [97.2 F (36.2 C)-98.8 F (37.1 C)] 98.4 F (36.9 C) (02/16 1158) Pulse Rate:  [66-98] 73 (02/16 1200) Resp:  [11-26] 21 (02/16 1200) BP: (114-154)/(71-125) 114/100 (02/16 1200) SpO2:  [96 %-100 %] 100 % (02/16 1200) Weight:  [228 lb 13.4 oz (103.8 kg)] 228 lb 13.4 oz (103.8 kg) (02/16 0500)  General:  Awake. No distress. Alert. Sitting up in chair watching TV. Integument:  Warm & dry. No rash or bruising on exposed skin. HEENT:  No scleral icterus or injection. Pupils symmetric.  Pulmonary:  Clear bilaterally to auscultation. No accessory muscle use on room air. Good aeration bilaterally.  Cardiovascular:  Regular rate & rhythm. No JVD apprecaited. No edema. Abdomen:  Soft. Nontender. Normal bowel sounds. Neurological:  Cranial nerves grossly in tact. No meningismus. Moving all 4 extremities equally. Oriented x4.  Musculoskeletal: No joint effusion  appreciated. Strength 5/5 and symmetric in bilateral hand grip, biceps, triceps, and leg flexion/extension.   CBC Latest Ref Rng & Units 05/26/2016 05/25/2016 05/24/2016  WBC 4.0 - 10.5 K/uL 7.5 11.1(H) 10.5  Hemoglobin 13.0 - 17.0 g/dL 12.7(L) 13.4 13.2  Hematocrit 39.0 - 52.0 % 38.2(L) 40.7 39.6  Platelets 150 - 400 K/uL 210 227 243    BMP Latest Ref Rng & Units 05/26/2016 05/25/2016 05/24/2016  Glucose 65 - 99 mg/dL 009(F) 90 81(W)  BUN 6 - 20 mg/dL 6 15 18   Creatinine 0.61 - 1.24 mg/dL 2.99) 3.71(I)  Sodium 135 - 145 mmol/L 137 142 141  Potassium 3.5 - 5.1 mmol/L 3.5 4.4 3.9  Chloride 101 - 111 mmol/L 110 112(H) 109  CO2 22 - 32 mmol/L 21(L) 21(L) 20(L)  Calcium 8.9 - 10.3 mg/dL 9.67(E) 9.3(Y) 9.2    IMAGING/STUDIES: CT HEAD W/O 05/25/16:  No intracranial process. Moderate to severe atherosclerosis. Mild parenchymal brain volume loss for age.  CT CHEST W/O 05/25/16:  Personally reviewed by me. Tiny bilateral pleural effusions and dependent atelectasis versus consolidation. No pathologic mediastinal adenopathy. No pericardial effusion. No parenchymal nodule otherwise.  MICROBIOLOGY: MRSA PCR 2/15:  Negative Blood Cultures x2 2/15 >>> Urine Culture 2/15 >>>  ANTIBIOTICS: None.  ASSESSMENT/PLAN:  56 y.o. male admitted after syncopal event. Patient was briefly intubated for altered mental status. Urine drug screen only positive for opiates and THC. Patient's description of the events leading up to his syncopal event would suggest a possible intracerebral ischemic event, but the patient is completely nonfocal at this time. An arrhythmia is also possible given his history of drug use  but the patient's cardiac biomarkers were nonelevated and his EKG showed no gross abnormality.  1. Syncope: Consult and PT for assessment. We'll bring EEG for evaluation for possible occult seizure activity. Continuing monitoring with telemetry. 2. Hypomagnesemia: Replaced with 2 g of magnesium sulfate  this morning. Ordering electrolytes and magnesium level with a.m. labs. 3. OSA: Doubt compliance with CPAP as he is currently homeless. Continuing continuous pulse oximetry with supplemental oxygen as needed. 4. Hypotension: Likely secondary to hypovolemia. Resolved. 5. Acute on chronic renal failure: Resolved with resuscitation. 6. Chronic hepatitis C viral infection: No prior treatment. 7. Polysubstance abuse: Plan for education prior to discharge from hospital. 8. Hypoglycemia: Resolved. Light due to poor oral intake. 9. Diet: Regular diet. 10. Prophylaxis: Heparin subcutaneous every 8 hours & Protonix by mouth every 24 hour. 11. Disposition: Transferring patient to telemetry bed for further monitoring as workup for grasses. May be able to discharge the next 24-48 hours if he remains stable and EEG is unrevealing.  TRH to assume care & PCCM off as of 2/17.  I have spent a total of 37 minutes of time today caring for the patient, reviewing the patient's electronic medical record, and with more than 50% of that time spent coordinating patient's transfer of care with the patient as well as reviewing the continuing plan of care with the patient at bedside.  Donna Christen Jamison Neighbor, M.D. [facility] Pager:  [phone] After 3pm or if no response, call [phone] 2:05 PM 05/26/16

## 2016-05-27 LAB — GLUCOSE, CAPILLARY
Glucose-Capillary: 109 mg/dL — ABNORMAL HIGH (ref 65–99)
Glucose-Capillary: 136 mg/dL — ABNORMAL HIGH (ref 65–99)
Glucose-Capillary: 143 mg/dL — ABNORMAL HIGH (ref 65–99)
Glucose-Capillary: 91 mg/dL (ref 65–99)

## 2016-05-27 LAB — CBC WITH DIFFERENTIAL/PLATELET
Basophils Absolute: 0 10*3/uL (ref 0.0–0.1)
Basophils Relative: 0 %
Eosinophils Absolute: 0.2 10*3/uL (ref 0.0–0.7)
Eosinophils Relative: 3 %
HCT: 36.9 % — ABNORMAL LOW (ref 39.0–52.0)
Hemoglobin: 12.4 g/dL — ABNORMAL LOW (ref 13.0–17.0)
Lymphocytes Relative: 30 %
Lymphs Abs: 2.2 10*3/uL (ref 0.7–4.0)
MCH: 27.9 pg (ref 26.0–34.0)
MCHC: 33.6 g/dL (ref 30.0–36.0)
MCV: 82.9 fL (ref 78.0–100.0)
Monocytes Absolute: 0.6 10*3/uL (ref 0.1–1.0)
Monocytes Relative: 9 %
Neutro Abs: 4.3 10*3/uL (ref 1.7–7.7)
Neutrophils Relative %: 58 %
Platelets: 225 10*3/uL (ref 150–400)
RBC: 4.45 MIL/uL (ref 4.22–5.81)
RDW: 14.8 % (ref 11.5–15.5)
WBC: 7.3 10*3/uL (ref 4.0–10.5)

## 2016-05-27 LAB — MAGNESIUM: Magnesium: 1.7 mg/dL (ref 1.7–2.4)

## 2016-05-27 LAB — RENAL FUNCTION PANEL
Albumin: 3.1 g/dL — ABNORMAL LOW (ref 3.5–5.0)
Anion gap: 7 (ref 5–15)
BUN: 5 mg/dL — ABNORMAL LOW (ref 6–20)
CO2: 24 mmol/L (ref 22–32)
Calcium: 8.9 mg/dL (ref 8.9–10.3)
Chloride: 109 mmol/L (ref 101–111)
Creatinine, Ser: 0.93 mg/dL (ref 0.61–1.24)
GFR calc Af Amer: >60 mL/min (ref >60)
GFR calc non Af Amer: >60 mL/min (ref >60)
Glucose, Bld: 90 mg/dL (ref 65–99)
Phosphorus: 3.7 mg/dL (ref 2.5–4.6)
Potassium: 3.9 mmol/L (ref 3.5–5.1)
Sodium: 140 mmol/L (ref 135–145)

## 2016-05-27 MED ORDER — BENZONATATE 100 MG PO CAPS
100.0000 mg | ORAL_CAPSULE | Freq: Two times a day (BID) | ORAL | Status: DC | PRN
  Administered 2016-05-27: 100 mg via ORAL
  Filled 2016-05-27: qty 1

## 2016-05-27 MED ORDER — BENZONATATE 100 MG PO CAPS: 100.0000 mg | ORAL_CAPSULE | Freq: Two times a day (BID) | ORAL | 0 refills | Status: DC | PRN

## 2016-05-27 NOTE — Progress Notes (Signed)
Patient complaining of dry cough. MD informed and per his order patient received Tessalon 1 cp. Will continue to monitor.

## 2016-05-27 NOTE — Progress Notes (Signed)
Patient was discharged to the shelter by MD order; discharged instructions review and give to patient with care notes; IV DIC; social worker offered voucher for transportation; patient will be escorted to the taxi by the nurse.

## 2016-05-27 NOTE — Discharge Summary (Addendum)
Physician Discharge Summary  [name] LSL:[id number] DOB: 04/10/1961 DOA: 05/24/2016  PCP: [facility]  Admit date: 05/24/2016 Discharge date: 05/27/2016  Time spent: > 35 minutes  Recommendations for Outpatient Follow-up:  1. Monitor blood pressures And adjust antihypertensives accordingly   Discharge Diagnoses:  Active Problems:   Acute encephalopathy   Discharge Condition: Stable  Diet recommendation: Regular diet  Filed Weights   05/26/16 0500 05/26/16 1711 05/27/16 0409  Weight: 103.8 kg (228 lb 13.4 oz) 105 kg (231 lb 7.7 oz) 104.2 kg (229 lb 12.8 oz)    History of present illness:  56 y/o with significant psychiatric history, PTSD, drug abuse, CKD, HTN, Hep C, OSA presents with altered mental status. [facility]. Blood sugar, salicylate, tylenol levels were normal. UDS is positive for THC and opiates.  Patient was found unresponsive and required intubation initially was able to be extubated same day.  Hospital Course:  Syncope - Of unclear etiology at this point. EEG negative for seizure-like activity. Physical therapy evaluated and recommended cane. - Vital signs stable.  We'll plan on discharging patient with recommendations for patient to follow-up with primary care physician within the next week.  For other known medical conditions we'll continue home medication regimen. Of note however will hold lisinopril and continue Cardizem given history of SVT.  Procedures:  None  Consultations:  None  Discharge Exam: Vitals:   05/26/16 2141 05/27/16 0554  BP: 117/73 131/78  Pulse: 78 63  Resp: (!) 24 18  Temp: 98.7 F (37.1 C) 97.5 F (36.4 C)    General: Pt in nad, alert and awake Cardiovascular: no cyanosis Respiratory: no increased wob, no wheezes  Discharge Instructions   Discharge Instructions    Call MD for:  temperature >100.4    Complete by:  As directed    Diet - low sodium heart healthy     Complete by:  As directed    Discharge instructions    Complete by:  As directed    Please follow-up with your primary care physician within next week for further evaluation recommendations.   Increase activity slowly    Complete by:  As directed      Current Discharge Medication List    START taking these medications   Details  benzonatate (TESSALON) 100 MG capsule Take 1 capsule (100 mg total) by mouth 2 (two) times daily as needed for cough. Qty: 20 capsule, Refills: 0      CONTINUE these medications which have NOT CHANGED   Details  acetaminophen (TYLENOL) 500 MG tablet Take 500-1,000 mg by mouth 3 (three) times daily as needed (for pain).    aspirin EC 81 MG tablet Take 81 mg by mouth daily.    cetirizine (ZYRTEC) 10 MG tablet Take 10 mg by mouth daily.    diltiazem (CARDIZEM) 60 MG tablet Take 120 mg by mouth daily.    gabapentin (NEURONTIN) 300 MG capsule Take 1 capsule (300 mg total) by mouth 3 (three) times daily. Qty: 90 capsule, Refills: 0    lamoTRIgine (LAMICTAL) 25 MG tablet Take 25 mg by mouth at bedtime.    pantoprazole (PROTONIX) 40 MG tablet Take 1 tablet (40 mg total) by mouth daily.    QUEtiapine (SEROQUEL) 400 MG tablet Take 400 mg by mouth at bedtime.      STOP taking these medications     amLODipine (NORVASC) 10 MG tablet      baclofen (LIORESAL) 10 MG tablet  hydrocortisone (ANUSOL-HC) 2.5 % rectal cream      hydrocortisone (ANUSOL-HC) 25 MG suppository      ibuprofen (ADVIL,MOTRIN) 800 MG tablet      lidocaine (LIDODERM) 5 %      lisinopril (PRINIVIL,ZESTRIL) 40 MG tablet      diltiazem (CARDIZEM CD) 240 MG 24 hr capsule      hydrOXYzine (ATARAX/VISTARIL) 25 MG tablet      tamsulosin (FLOMAX) 0.4 MG CAPS capsule      topiramate (TOPAMAX) 25 MG tablet      traZODone (DESYREL) 100 MG tablet        Allergies  Allergen Reactions  . Lactose Intolerance (Gi) Hives and Nausea Only      The results of significant diagnostics  from this hospitalization (including imaging, microbiology, ancillary and laboratory) are listed below for reference.    Significant Diagnostic Studies: Dg Chest 2 View  Result Date: 05/18/2016 CLINICAL DATA:  Central chest pain, dyspnea, fever, nausea and vomiting with diarrhea x4 days. Patient is a smoker. EXAM: CHEST  2 VIEW COMPARISON:  04/27/2016 CXR FINDINGS: Borderline cardiomegaly. No aortic aneurysm. Mild bronchitic change noted bilaterally possibly related to patient's history of smoking. The visualized skeletal structures are unremarkable. IMPRESSION: Mild increase in interstitial prominence which may reflect chronic bronchitic change. No alveolar consolidation or CHF. No effusion or pneumothorax. Electronically Signed   By: Tollie Eth M.D.   On: 05/18/2016 16:18   Ct Head Wo Contrast  Result Date: 05/25/2016 CLINICAL DATA:  Syncopal episode. Intubated. History of polysubstance abuse, recent heart attack. EXAM: CT HEAD WITHOUT CONTRAST TECHNIQUE: Contiguous axial images were obtained from the base of the skull through the vertex without intravenous contrast. COMPARISON:  CT HEAD September 25, 2015 FINDINGS: BRAIN: The ventricles and sulci are proportionally mildly prominent for age. No intraparenchymal hemorrhage, mass effect nor midline shift. Patchy supratentorial white matter hypodensities. No acute large vascular territory infarcts. Similar mildly prominent low-density cerebral spinal fluid space interhemispheric fissure. Basal cisterns are patent. VASCULAR: Moderate to severe calcific atherosclerosis of the carotid siphons. SKULL: No skull fracture. No significant scalp soft tissue swelling. SINUSES/ORBITS: The mastoid air-cells and included paranasal sinuses are well-aerated.The included ocular globes and orbital contents are non-suspicious. OTHER: Life-support lines in place. IMPRESSION: No acute intracranial process. Moderate to severe atherosclerosis. Mild parenchymal brain volume loss for  age and mild atherosclerosis. Electronically Signed   By: Awilda Metro M.D.   On: 05/25/2016 01:02   Ct Chest Wo Contrast  Result Date: 05/25/2016 CLINICAL DATA:  Abnormal chest x-ray. Suspected debris or mass in the right mainstem bronchus. EXAM: CT CHEST WITHOUT CONTRAST TECHNIQUE: Multidetector CT imaging of the chest was performed following the standard protocol without IV contrast. COMPARISON:  Chest radiograph 05/25/2016.  CT chest 11/25/2015 FINDINGS: Cardiovascular: Normal heart size. No pericardial effusion. Coronary artery calcifications. Normal caliber thoracic aorta. Mediastinum/Nodes: Endotracheal tube with tip above the carina. Enteric tube with tip in the stomach. Esophagus is decompressed. There is infiltration and edema in the soft tissues around the thyroid gland and in the thoracic inlet. This is incompletely included within the field of view. This could represent soft tissue hematoma, infection, or infiltration. Suggest CT neck for further evaluation. Lungs/Pleura: Trachea and bronchi appear clear in patent. There is atelectasis or consolidation in both lower lungs with air bronchograms. Small bilateral pleural effusions. No pneumothorax. Upper Abdomen: Prominent lymph nodes in the porta hepatis, nonspecific. No focal liver lesions appreciated. Spleen size is normal. Musculoskeletal: No chest wall mass  or suspicious bone lesions identified. IMPRESSION: No evidence of right hilar or bronchial lesion. Atelectasis or consolidation in both lower lungs with small bilateral pleural effusions. Infiltration and edema in the soft tissues around the thyroid gland and in the thoracic inlet, incompletely included. This could represent soft tissue hematoma, infection, or other infiltrative process. Suggest CT neck for further evaluation. Electronically Signed   By: Burman Nieves M.D.   On: 05/25/2016 04:40   Dg Chest Port 1 View  Result Date: 05/25/2016 CLINICAL DATA:  Endotracheal tube  placement.  Shortness of breath. EXAM: PORTABLE CHEST 1 VIEW COMPARISON:  05/25/2016 FINDINGS: Endotracheal tube tip measures 3.9 cm above the carina. Enteric tube tip is coiled in the left upper quadrant consistent with location in the body of the stomach. Shallow inspiration with atelectasis in the lung bases. No focal consolidation. No blunting of costophrenic angles. No pneumothorax. Normal heart size and pulmonary vascularity. IMPRESSION: Appliances appear in satisfactory position. Shallow inspiration with atelectasis in the lung bases. Electronically Signed   By: Burman Nieves M.D.   On: 05/25/2016 05:32   Dg Chest Portable 1 View  Result Date: 05/25/2016 CLINICAL DATA:  Post intubation and orogastric tube placement EXAM: PORTABLE CHEST 1 VIEW COMPARISON:  Chest radiograph January 21, 2017 FINDINGS: Cardiomediastinal silhouette is normal for this low inspiratory examination with crowded vascular markings. Bandlike density RIGHT midlung zone. Soft tissue density projecting at RIGHT mainstem bronchus. Endotracheal tube tip projects 2.8 cm above the carina. Distal tip of nasogastric and side port projecting in the proximal stomach. No pleural effusion or focal consolidation. No pneumothorax. Soft tissue planes and included osseous structure nonsuspicious. IMPRESSION: Pulmonary vascular congestion, suspected debris or mass RIGHT mainstem bronchus. RIGHT midlung zone atelectasis. Endotracheal tube tip projects 2.8 cm above the carina. Distal tip of nasogastric and side port projecting in the proximal stomach. Electronically Signed   By: Awilda Metro M.D.   On: 05/25/2016 00:36   Dg Chest Port 1 View  Result Date: 05/24/2016 CLINICAL DATA:  Syncope, shortness of breath. History of polysubstance abuse, chronic kidney disease, hypertension. EXAM: PORTABLE CHEST 1 VIEW COMPARISON:  Chest radiograph May 18, 2016 FINDINGS: Cardiomediastinal silhouette is unremarkable for this low inspiratory  examination with crowded engorged vasculature markings. The lungs are clear without pleural effusions or focal consolidations. Trachea projects midline and there is no pneumothorax. Included soft tissue planes and osseous structures are non-suspicious. IMPRESSION: Pulmonary vascular congestion in this low inspiratory portable examination. Electronically Signed   By: Awilda Metro M.D.   On: 05/24/2016 23:31    Microbiology: Recent Results (from the past 240 hour(s))  Culture, blood (routine x 2)     Status: None (Preliminary result)   Collection Time: 05/24/16 11:38 PM  Result Value Ref Range Status   Specimen Description BLOOD RIGHT ARM  Final   Special Requests AEROBIC BOTTLE ONLY  Final   Culture NO GROWTH 2 DAYS  Final   Report Status PENDING  Incomplete  Culture, blood (routine x 2)     Status: None (Preliminary result)   Collection Time: 05/24/16 11:43 PM  Result Value Ref Range Status   Specimen Description BLOOD RIGHT HAND  Final   Special Requests AEROBIC BOTTLE ONLY  Final   Culture NO GROWTH 2 DAYS  Final   Report Status PENDING  Incomplete  Urine culture     Status: Abnormal   Collection Time: 05/25/16  2:20 AM  Result Value Ref Range Status   Specimen Description URINE,  CLEAN CATCH  Final   Special Requests NONE  Final   Culture (A)  Final    30,000 COLONIES/mL GROUP B STREP(S.AGALACTIAE)ISOLATED TESTING AGAINST S. AGALACTIAE NOT ROUTINELY PERFORMED DUE TO PREDICTABILITY OF AMP/PEN/VAN SUSCEPTIBILITY.    Report Status 05/26/2016 FINAL  Final  MRSA PCR Screening     Status: None   Collection Time: 05/25/16  4:51 AM  Result Value Ref Range Status   MRSA by PCR NEGATIVE NEGATIVE Final    Comment:        The GeneXpert MRSA Assay (FDA approved for NASAL specimens only), is one component of a comprehensive MRSA colonization surveillance program. It is not intended to diagnose MRSA infection nor to guide or monitor treatment for MRSA infections.       Labs: Basic Metabolic Panel:  Recent Labs Lab 05/24/16 2304 05/25/16 0339 05/26/16 0253 05/27/16 0638  NA 141 142 137 140  K 3.9 4.4 3.5 3.9  CL 109 112* 110 109  CO2 20* 21* 21* 24  GLUCOSE 57* 90 131* 90  BUN 18 15 6  5*  CREATININE 2.57* 1.75* 0.91 0.93  CALCIUM 9.2 8.8* 8.6* 8.9  MG  --  1.9 1.5* 1.7  PHOS  --  4.4 2.8 3.7   Liver Function Tests:  Recent Labs Lab 05/24/16 2304 05/27/16 0638  AST 28  --   ALT 36  --   ALKPHOS 44  --   BILITOT 0.6  --   PROT 6.8  --   ALBUMIN 3.9 3.1*   No results for input(s): LIPASE, AMYLASE in the last 168 hours. No results for input(s): AMMONIA in the last 168 hours. CBC:  Recent Labs Lab 05/24/16 2304 05/25/16 0339 05/26/16 0253 05/27/16 0638  WBC 10.5 11.1* 7.5 7.3  NEUTROABS 7.6  --   --  4.3  HGB 13.2 13.4 12.7* 12.4*  HCT 39.6 40.7 38.2* 36.9*  MCV 84.8 85.1 83.4 82.9  PLT 243 227 210 225   Cardiac Enzymes:  Recent Labs Lab 05/24/16 2304 05/25/16 0354 05/25/16 1149 05/25/16 1446  TROPONINI <0.03 <0.03 <0.03 <0.03   BNP: BNP (last 3 results) No results for input(s): BNP in the last 8760 hours.  ProBNP (last 3 results) No results for input(s): PROBNP in the last 8760 hours.  CBG:  Recent Labs Lab 05/26/16 2016 05/27/16 0036 05/27/16 0402 05/27/16 0802 05/27/16 1210  GLUCAP 107* 136* 109* 91 143*       Signed:  Penny Pia MD.  [facility] 05/27/2016, 3:00 PM

## 2016-05-30 LAB — CULTURE, BLOOD (ROUTINE X 2)
Culture: NO GROWTH
Culture: NO GROWTH

## 2016-06-17 DIAGNOSIS — F319 Bipolar disorder, unspecified: Secondary | ICD-10-CM | POA: Diagnosis not present

## 2016-06-17 DIAGNOSIS — R45851 Suicidal ideations: Secondary | ICD-10-CM | POA: Diagnosis not present

## 2016-06-17 DIAGNOSIS — B1920 Unspecified viral hepatitis C without hepatic coma: Secondary | ICD-10-CM | POA: Diagnosis not present

## 2016-06-17 DIAGNOSIS — F17210 Nicotine dependence, cigarettes, uncomplicated: Secondary | ICD-10-CM | POA: Diagnosis not present

## 2016-06-17 DIAGNOSIS — R45850 Homicidal ideations: Secondary | ICD-10-CM | POA: Diagnosis not present

## 2016-06-17 DIAGNOSIS — I10 Essential (primary) hypertension: Secondary | ICD-10-CM | POA: Diagnosis not present

## 2016-06-18 DIAGNOSIS — F609 Personality disorder, unspecified: Secondary | ICD-10-CM | POA: Insufficient documentation

## 2016-06-18 DIAGNOSIS — F1120 Opioid dependence, uncomplicated: Secondary | ICD-10-CM | POA: Insufficient documentation

## 2016-06-18 DIAGNOSIS — Z765 Malingerer [conscious simulation]: Secondary | ICD-10-CM | POA: Insufficient documentation

## 2016-08-07 ENCOUNTER — Emergency Department (HOSPITAL_COMMUNITY)
Admission: EM | Admit: 2016-08-07 | Discharge: 2016-08-07 | Disposition: A | Discharge status: Home / Self Care | Payer: Medicare Other | Attending: Emergency Medicine | Admitting: Emergency Medicine

## 2016-08-07 ENCOUNTER — Encounter (HOSPITAL_COMMUNITY): Payer: Self-pay

## 2016-08-07 DIAGNOSIS — L02412 Cutaneous abscess of left axilla: Secondary | ICD-10-CM | POA: Insufficient documentation

## 2016-08-07 DIAGNOSIS — N189 Chronic kidney disease, unspecified: Secondary | ICD-10-CM | POA: Diagnosis not present

## 2016-08-07 DIAGNOSIS — I252 Old myocardial infarction: Secondary | ICD-10-CM | POA: Diagnosis not present

## 2016-08-07 DIAGNOSIS — I129 Hypertensive chronic kidney disease with stage 1 through stage 4 chronic kidney disease, or unspecified chronic kidney disease: Secondary | ICD-10-CM | POA: Insufficient documentation

## 2016-08-07 DIAGNOSIS — Z7982 Long term (current) use of aspirin: Secondary | ICD-10-CM | POA: Insufficient documentation

## 2016-08-07 DIAGNOSIS — Z79899 Other long term (current) drug therapy: Secondary | ICD-10-CM | POA: Insufficient documentation

## 2016-08-07 DIAGNOSIS — F17200 Nicotine dependence, unspecified, uncomplicated: Secondary | ICD-10-CM | POA: Insufficient documentation

## 2016-08-07 DIAGNOSIS — K1120 Sialoadenitis, unspecified: Secondary | ICD-10-CM | POA: Insufficient documentation

## 2016-08-07 LAB — CBC WITH DIFFERENTIAL/PLATELET
Basophils Absolute: 0 10*3/uL (ref 0.0–0.1)
Basophils Relative: 0 %
Eosinophils Absolute: 0.1 10*3/uL (ref 0.0–0.7)
Eosinophils Relative: 2 %
HCT: 43.4 % (ref 39.0–52.0)
Hemoglobin: 14 g/dL (ref 13.0–17.0)
Lymphocytes Relative: 24 %
Lymphs Abs: 1.7 10*3/uL (ref 0.7–4.0)
MCH: 26.9 pg (ref 26.0–34.0)
MCHC: 32.3 g/dL (ref 30.0–36.0)
MCV: 83.5 fL (ref 78.0–100.0)
Monocytes Absolute: 0.5 10*3/uL (ref 0.1–1.0)
Monocytes Relative: 7 %
Neutro Abs: 4.7 10*3/uL (ref 1.7–7.7)
Neutrophils Relative %: 67 %
Platelets: 274 10*3/uL (ref 150–400)
RBC: 5.2 MIL/uL (ref 4.22–5.81)
RDW: 15.7 % — ABNORMAL HIGH (ref 11.5–15.5)
WBC: 7 10*3/uL (ref 4.0–10.5)

## 2016-08-07 LAB — COMPREHENSIVE METABOLIC PANEL
ALT: 138 U/L — ABNORMAL HIGH (ref 17–63)
AST: 75 U/L — ABNORMAL HIGH (ref 15–41)
Albumin: 4.4 g/dL (ref 3.5–5.0)
Alkaline Phosphatase: 55 U/L (ref 38–126)
Anion gap: 9 (ref 5–15)
BUN: 12 mg/dL (ref 6–20)
CO2: 22 mmol/L (ref 22–32)
Calcium: 9.6 mg/dL (ref 8.9–10.3)
Chloride: 105 mmol/L (ref 101–111)
Creatinine, Ser: 0.75 mg/dL (ref 0.61–1.24)
GFR calc Af Amer: >60 mL/min (ref >60)
GFR calc non Af Amer: >60 mL/min (ref >60)
Glucose, Bld: 119 mg/dL — ABNORMAL HIGH (ref 65–99)
Potassium: 4.4 mmol/L (ref 3.5–5.1)
Sodium: 136 mmol/L (ref 135–145)
Total Bilirubin: 0.4 mg/dL (ref 0.3–1.2)
Total Protein: 8 g/dL (ref 6.5–8.1)

## 2016-08-07 LAB — I-STAT CG4 LACTIC ACID, ED: Lactic Acid, Venous: 1.28 mmol/L (ref 0.5–1.9)

## 2016-08-07 MED ORDER — CEPHALEXIN 500 MG PO CAPS: 500.0000 mg | ORAL_CAPSULE | Freq: Four times a day (QID) | ORAL | 0 refills | Status: DC

## 2016-08-07 MED ORDER — SULFAMETHOXAZOLE-TRIMETHOPRIM 800-160 MG PO TABS: 1.0000 | ORAL_TABLET | Freq: Two times a day (BID) | ORAL | 0 refills | Status: AC

## 2016-08-07 NOTE — ED Triage Notes (Signed)
Per Pt, Pt is coming from home with complaints of multiple abscesses noted to be under the left arm with cyst in the left face. Pt reports fevers at night with some nausea. Hx of HTN.

## 2016-08-07 NOTE — ED Provider Notes (Signed)
[facility]-EMERGENCY DEPT Provider Note   CSN: [id number] Arrival date & time: 08/07/16  1317 By signing my name below, I, [name], attest that this documentation has been prepared under the direction and in the presence of Benjiman Core, MD.  Electronically Signed: [name], Scribe. 08/07/2016. 4:15 PM.  History   Chief Complaint Chief Complaint  Patient presents with  . Cyst   [name] is a 56 y.o. male with a PMHx of polysubstance abuse, bipolar disorder, HepC and HTN who presents to the Emergency Department with complaints of an oral abscess in his left cheek and multiple abscesses under his left arm, in his armpit. He reports draining of the abscesses. Pt additionally endorses nausea 2/2 internal drainage.   The history is provided by the patient and medical records. No language interpreter was used.   Past Medical History:  Diagnosis Date  . Alcohol abuse   . Anxiety   . Arthritis    "all over" (11/25/2015)  . Bipolar 1 disorder (HCC)   . Bipolar affective disorder (HCC)   . Chronic back pain   . Chronic kidney disease   . Cocaine abuse    smokes crack-cocaine  . Depression   . GERD (gastroesophageal reflux disease)   . Gout   . Headache    "q couple days; stress, tension, anger" (11/25/2015)  . Hemorrhoids   . Hepatitis C    "not yet treated" (11/25/2015)  . Hypertension   . Insomnia   . Myocardial infarction [facility]) 11/2015   "supposedly" (11/25/2015)  . OSA on CPAP    "had mask; it was stolen" (11/25/2015)  . PTSD (post-traumatic stress disorder)   . Tobacco abuse   . Vitamin D deficiency     Patient Active Problem List   Diagnosis Date Noted  . Acute encephalopathy 05/25/2016  . Cocaine abuse with cocaine-induced mood disorder (HCC) 03/20/2016  . Abnormal nuclear stress test   . Palpitations 11/26/2015  . Troponin level elevated 11/26/2015  . Homeless 11/26/2015  . Essential hypertension 11/26/2015  . SVT (supraventricular tachycardia)  (HCC)   . Pain in the chest 11/25/2015  . GERD (gastroesophageal reflux disease)   . Bipolar 1 disorder (HCC)   . Back pain, chronic   . Chronic kidney disease   . Affective psychosis, bipolar (HCC)   . Alcohol use disorder, severe, dependence (HCC)   . Cocaine use disorder, severe, dependence (HCC)   . Generalized abdominal pain   . Bipolar I disorder, most recent episode depressed (HCC)   . Bipolar disorder, current episode depressed, severe, without psychotic features (HCC)   . Bipolar affective disorder, depressed, severe (HCC) 03/26/2014  . Right hip pain   . Medically noncompliant 03/23/2014  . Dysuria 03/22/2013  . Headache 03/22/2013  . Polysubstance dependence (HCC) 03/21/2012  . Chest pain 02/23/2012  . Anxiety   . Bipolar affective disorder (HCC)   . Depression   . Polysubstance abuse   . Gout   . Vitamin D deficiency   . Hepatitis C     Past Surgical History:  Procedure Laterality Date  . CARDIAC CATHETERIZATION N/A 11/29/2015   Procedure: Left Heart Cath and Coronary Angiography;  Surgeon: Laurey Morale, MD;  Location: [facility] INVASIVE CV LAB;  Service: Cardiovascular;  Laterality: N/A;  . FRACTURE SURGERY    . PATELLA FRACTURE SURGERY Left ~ 1995  . TONSILLECTOMY         Home Medications    Prior to Admission medications  Medication Sig Start Date End Date Taking? Authorizing Provider  acetaminophen (TYLENOL) 500 MG tablet Take 500-1,000 mg by mouth 3 (three) times daily as needed (for pain).    Historical Provider, MD  aspirin EC 81 MG tablet Take 81 mg by mouth daily.    Historical Provider, MD  benzonatate (TESSALON) 100 MG capsule Take 1 capsule (100 mg total) by mouth 2 (two) times daily as needed for cough. 05/27/16   Penny Pia, MD  cephALEXin (KEFLEX) 500 MG capsule Take 1 capsule (500 mg total) by mouth 4 (four) times daily. 08/07/16   Benjiman Core, MD  cetirizine (ZYRTEC) 10 MG tablet Take 10 mg by mouth daily.    Historical Provider, MD    diltiazem (CARDIZEM) 60 MG tablet Take 120 mg by mouth daily.    Historical Provider, MD  gabapentin (NEURONTIN) 300 MG capsule Take 1 capsule (300 mg total) by mouth 3 (three) times daily. Patient taking differently: Take 800 mg by mouth 3 (three) times daily.  02/01/15   Beau Fanny, FNP  lamoTRIgine (LAMICTAL) 25 MG tablet Take 25 mg by mouth at bedtime.    Historical Provider, MD  pantoprazole (PROTONIX) 40 MG tablet Take 1 tablet (40 mg total) by mouth daily. 02/01/15   Beau Fanny, FNP  QUEtiapine (SEROQUEL) 400 MG tablet Take 400 mg by mouth at bedtime.    Historical Provider, MD  sulfamethoxazole-trimethoprim (BACTRIM DS,SEPTRA DS) 800-160 MG tablet Take 1 tablet by mouth 2 (two) times daily. 08/07/16 08/14/16  Benjiman Core, MD    Family History Family History  Problem Relation Age of Onset  . Hypertension Brother   . Diabetes Brother   . Hypertension Mother   . Diabetes Mother   . Arthritis Mother   . Other Sister     bone disease    Social History Social History  Substance Use Topics  . Smoking status: Current Every Day Smoker    Packs/day: 0.12    Years: 46.00  . Smokeless tobacco: Never Used  . Alcohol use Yes     Comment: 11/25/2015 "quit alcohol 6 months ago; used to drink from morning til night"     Allergies   Lactose intolerance (gi)   Review of Systems Review of Systems  HENT:       Abscess in left cheek  Gastrointestinal: Positive for nausea.  Skin:       2 abscesses under left armpit   Physical Exam Updated Vital Signs BP (!) 141/92 (BP Location: Right Arm)   Pulse (!) 109   Temp 98.6 F (37 C) (Oral)   Resp 18   Ht 5\' 7"  (1.702 m)   Wt 245 lb (111.1 kg)   SpO2 96%   BMI 38.37 kg/m   Physical Exam  Constitutional: He is oriented to person, place, and time. He appears well-developed and well-nourished. No distress.  HENT:  Head: Normocephalic and atraumatic.  Left inner cheek 1 cm firm mass draining bloody purulent drainage.  Looks like it is at the parotid papilla area. No further swelling noted in the cheek.Good dentition  Cardiovascular: Normal rate.   Pulmonary/Chest: Effort normal.  Musculoskeletal:  Left axilla, more superior 40mm x 71mm firm mass initially fluid with bloody, purulent drainage. After expressing the pus there is a firm mass.  Inferior to that one there is a 1x2cm firm indurated mass with some fluid on the top also draining with pressure some bloody purulence. No surrounding erythema.  Neurological: He is alert and oriented  to person, place, and time.  Skin: Skin is warm and dry.  Psychiatric: He has a normal mood and affect.  Nursing note and vitals reviewed.  ED Treatments / Results  DIAGNOSTIC STUDIES: Oxygen Saturation is 95% on room air, adequate by my interpretation.    COORDINATION OF CARE: 3:49 PM Discussed treatment plan with pt at bedside and pt agreed to plan.  Labs (all labs ordered are listed, but only abnormal results are displayed) Labs Reviewed  COMPREHENSIVE METABOLIC PANEL - Abnormal; Notable for the following:       Result Value   Glucose, Bld 119 (*)    AST 75 (*)    ALT 138 (*)    All other components within normal limits  CBC WITH DIFFERENTIAL/PLATELET - Abnormal; Notable for the following:    RDW 15.7 (*)    All other components within normal limits  I-STAT CG4 LACTIC ACID, ED    EKG  EKG Interpretation None       Radiology No results found.  Procedures Procedures (including critical care time)  Medications Ordered in ED Medications - No data to display   Initial Impression / Assessment and Plan / ED Course  I have reviewed the triage vital signs and the nursing notes.  Pertinent labs & imaging results that were available during my care of the patient were reviewed by me and considered in my medical decision making (see chart for details).     Patient withAbscess of left axilla 2 small areasWith underlying deeper cellulitis. Purulent status  freely expressed and there is drainage. Does not appear to need opening or incision at this time. Also had looks like that improving parotitis. Well-appearing. Will give antibiotics. Was asking for some medication assistance discharge home.  Final Clinical Impressions(s) / ED Diagnoses   Final diagnoses:  Abscess of left axilla  Parotitis    New Prescriptions New Prescriptions   CEPHALEXIN (KEFLEX) 500 MG CAPSULE    Take 1 capsule (500 mg total) by mouth 4 (four) times daily.   SULFAMETHOXAZOLE-TRIMETHOPRIM (BACTRIM DS,SEPTRA DS) 800-160 MG TABLET    Take 1 tablet by mouth 2 (two) times daily.   I personally performed the services described in this documentation, which was scribed in my presence. The recorded information has been reviewed and is accurate.      Benjiman Core, MD 08/07/16 [phone]

## 2016-08-15 DIAGNOSIS — S8002XA Contusion of left knee, initial encounter: Secondary | ICD-10-CM | POA: Diagnosis not present

## 2016-08-15 DIAGNOSIS — M25562 Pain in left knee: Secondary | ICD-10-CM | POA: Diagnosis not present

## 2016-08-15 DIAGNOSIS — S8992XA Unspecified injury of left lower leg, initial encounter: Secondary | ICD-10-CM | POA: Diagnosis not present

## 2016-08-15 DIAGNOSIS — S022XXA Fracture of nasal bones, initial encounter for closed fracture: Secondary | ICD-10-CM | POA: Diagnosis not present

## 2016-08-15 DIAGNOSIS — S0083XA Contusion of other part of head, initial encounter: Secondary | ICD-10-CM | POA: Diagnosis not present

## 2016-08-15 DIAGNOSIS — S60511A Abrasion of right hand, initial encounter: Secondary | ICD-10-CM | POA: Diagnosis not present

## 2016-08-24 DIAGNOSIS — F4310 Post-traumatic stress disorder, unspecified: Secondary | ICD-10-CM | POA: Diagnosis not present

## 2016-08-24 DIAGNOSIS — S022XXA Fracture of nasal bones, initial encounter for closed fracture: Secondary | ICD-10-CM | POA: Diagnosis not present

## 2016-08-24 DIAGNOSIS — K219 Gastro-esophageal reflux disease without esophagitis: Secondary | ICD-10-CM | POA: Diagnosis not present

## 2016-08-24 DIAGNOSIS — F17210 Nicotine dependence, cigarettes, uncomplicated: Secondary | ICD-10-CM | POA: Diagnosis not present

## 2016-08-24 DIAGNOSIS — R51 Headache: Secondary | ICD-10-CM | POA: Diagnosis not present

## 2016-08-24 DIAGNOSIS — H1130 Conjunctival hemorrhage, unspecified eye: Secondary | ICD-10-CM | POA: Diagnosis not present

## 2017-01-17 DIAGNOSIS — I10 Essential (primary) hypertension: Secondary | ICD-10-CM | POA: Insufficient documentation

## 2017-01-17 DIAGNOSIS — E119 Type 2 diabetes mellitus without complications: Secondary | ICD-10-CM | POA: Insufficient documentation

## 2017-01-17 DIAGNOSIS — I2510 Atherosclerotic heart disease of native coronary artery without angina pectoris: Secondary | ICD-10-CM | POA: Insufficient documentation

## 2017-10-31 DIAGNOSIS — M4802 Spinal stenosis, cervical region: Secondary | ICD-10-CM | POA: Insufficient documentation

## 2018-12-24 ENCOUNTER — Inpatient Hospital Stay (HOSPITAL_COMMUNITY)
Admission: EM | Admit: 2018-12-24 | Discharge: 2018-12-26 | DRG: 392 | Disposition: A | Discharge status: Home / Self Care | Payer: No Typology Code available for payment source | Attending: Internal Medicine | Admitting: Internal Medicine

## 2018-12-24 ENCOUNTER — Emergency Department (HOSPITAL_COMMUNITY): Payer: No Typology Code available for payment source

## 2018-12-24 ENCOUNTER — Observation Stay (HOSPITAL_COMMUNITY): Payer: No Typology Code available for payment source

## 2018-12-24 ENCOUNTER — Encounter (HOSPITAL_COMMUNITY): Payer: Self-pay | Admitting: Emergency Medicine

## 2018-12-24 ENCOUNTER — Other Ambulatory Visit: Payer: Self-pay

## 2018-12-24 DIAGNOSIS — Z833 Family history of diabetes mellitus: Secondary | ICD-10-CM

## 2018-12-24 DIAGNOSIS — F1910 Other psychoactive substance abuse, uncomplicated: Secondary | ICD-10-CM | POA: Diagnosis present

## 2018-12-24 DIAGNOSIS — F1990 Other psychoactive substance use, unspecified, uncomplicated: Secondary | ICD-10-CM | POA: Diagnosis present

## 2018-12-24 DIAGNOSIS — R1013 Epigastric pain: Secondary | ICD-10-CM | POA: Diagnosis present

## 2018-12-24 DIAGNOSIS — A0811 Acute gastroenteropathy due to Norwalk agent: Secondary | ICD-10-CM | POA: Diagnosis not present

## 2018-12-24 DIAGNOSIS — G8929 Other chronic pain: Secondary | ICD-10-CM | POA: Diagnosis present

## 2018-12-24 DIAGNOSIS — M549 Dorsalgia, unspecified: Secondary | ICD-10-CM | POA: Diagnosis present

## 2018-12-24 DIAGNOSIS — I252 Old myocardial infarction: Secondary | ICD-10-CM

## 2018-12-24 DIAGNOSIS — Z8249 Family history of ischemic heart disease and other diseases of the circulatory system: Secondary | ICD-10-CM

## 2018-12-24 DIAGNOSIS — F17210 Nicotine dependence, cigarettes, uncomplicated: Secondary | ICD-10-CM | POA: Diagnosis present

## 2018-12-24 DIAGNOSIS — N189 Chronic kidney disease, unspecified: Secondary | ICD-10-CM | POA: Diagnosis present

## 2018-12-24 DIAGNOSIS — F419 Anxiety disorder, unspecified: Secondary | ICD-10-CM | POA: Diagnosis present

## 2018-12-24 DIAGNOSIS — E739 Lactose intolerance, unspecified: Secondary | ICD-10-CM | POA: Diagnosis present

## 2018-12-24 DIAGNOSIS — R079 Chest pain, unspecified: Secondary | ICD-10-CM | POA: Diagnosis present

## 2018-12-24 DIAGNOSIS — I129 Hypertensive chronic kidney disease with stage 1 through stage 4 chronic kidney disease, or unspecified chronic kidney disease: Secondary | ICD-10-CM | POA: Diagnosis present

## 2018-12-24 DIAGNOSIS — I25110 Atherosclerotic heart disease of native coronary artery with unstable angina pectoris: Secondary | ICD-10-CM | POA: Diagnosis present

## 2018-12-24 DIAGNOSIS — Z8261 Family history of arthritis: Secondary | ICD-10-CM

## 2018-12-24 DIAGNOSIS — Z955 Presence of coronary angioplasty implant and graft: Secondary | ICD-10-CM

## 2018-12-24 DIAGNOSIS — Z23 Encounter for immunization: Secondary | ICD-10-CM

## 2018-12-24 DIAGNOSIS — Z8619 Personal history of other infectious and parasitic diseases: Secondary | ICD-10-CM

## 2018-12-24 DIAGNOSIS — Z20828 Contact with and (suspected) exposure to other viral communicable diseases: Secondary | ICD-10-CM | POA: Diagnosis present

## 2018-12-24 DIAGNOSIS — Z79899 Other long term (current) drug therapy: Secondary | ICD-10-CM

## 2018-12-24 DIAGNOSIS — Z7982 Long term (current) use of aspirin: Secondary | ICD-10-CM

## 2018-12-24 DIAGNOSIS — E559 Vitamin D deficiency, unspecified: Secondary | ICD-10-CM | POA: Diagnosis present

## 2018-12-24 DIAGNOSIS — G4733 Obstructive sleep apnea (adult) (pediatric): Secondary | ICD-10-CM | POA: Diagnosis present

## 2018-12-24 DIAGNOSIS — I200 Unstable angina: Secondary | ICD-10-CM | POA: Diagnosis not present

## 2018-12-24 DIAGNOSIS — Z66 Do not resuscitate: Secondary | ICD-10-CM | POA: Diagnosis present

## 2018-12-24 DIAGNOSIS — K219 Gastro-esophageal reflux disease without esophagitis: Secondary | ICD-10-CM | POA: Diagnosis present

## 2018-12-24 DIAGNOSIS — F319 Bipolar disorder, unspecified: Secondary | ICD-10-CM | POA: Diagnosis present

## 2018-12-24 LAB — CBC WITH DIFFERENTIAL/PLATELET
Abs Immature Granulocytes: 0.04 10*3/uL (ref 0.00–0.07)
Basophils Absolute: 0 10*3/uL (ref 0.0–0.1)
Basophils Relative: 0 %
Eosinophils Absolute: 0.2 10*3/uL (ref 0.0–0.5)
Eosinophils Relative: 2 %
HCT: 42.9 % (ref 39.0–52.0)
Hemoglobin: 14.4 g/dL (ref 13.0–17.0)
Immature Granulocytes: 0 %
Lymphocytes Relative: 16 %
Lymphs Abs: 1.4 10*3/uL (ref 0.7–4.0)
MCH: 29.1 pg (ref 26.0–34.0)
MCHC: 33.6 g/dL (ref 30.0–36.0)
MCV: 86.7 fL (ref 80.0–100.0)
Monocytes Absolute: 0.6 10*3/uL (ref 0.1–1.0)
Monocytes Relative: 7 %
Neutro Abs: 6.8 10*3/uL (ref 1.7–7.7)
Neutrophils Relative %: 75 %
Platelets: 274 10*3/uL (ref 150–400)
RBC: 4.95 MIL/uL (ref 4.22–5.81)
RDW: 13.8 % (ref 11.5–15.5)
WBC: 9.1 10*3/uL (ref 4.0–10.5)
nRBC: 0 % (ref 0.0–0.2)

## 2018-12-24 LAB — URINALYSIS, ROUTINE W REFLEX MICROSCOPIC
Bilirubin Urine: NEGATIVE
Glucose, UA: NEGATIVE mg/dL
Hgb urine dipstick: NEGATIVE
Ketones, ur: NEGATIVE mg/dL
Leukocytes,Ua: NEGATIVE
Nitrite: NEGATIVE
Protein, ur: NEGATIVE mg/dL
Specific Gravity, Urine: 1.021 (ref 1.005–1.030)
pH: 5 (ref 5.0–8.0)

## 2018-12-24 LAB — COMPREHENSIVE METABOLIC PANEL
ALT: 89 U/L — ABNORMAL HIGH (ref 0–44)
AST: 43 U/L — ABNORMAL HIGH (ref 15–41)
Albumin: 4.1 g/dL (ref 3.5–5.0)
Alkaline Phosphatase: 55 U/L (ref 38–126)
Anion gap: 10 (ref 5–15)
BUN: 14 mg/dL (ref 6–20)
CO2: 20 mmol/L — ABNORMAL LOW (ref 22–32)
Calcium: 9.2 mg/dL (ref 8.9–10.3)
Chloride: 105 mmol/L (ref 98–111)
Creatinine, Ser: 0.91 mg/dL (ref 0.61–1.24)
GFR calc Af Amer: >60 mL/min (ref >60)
GFR calc non Af Amer: >60 mL/min (ref >60)
Glucose, Bld: 110 mg/dL — ABNORMAL HIGH (ref 70–99)
Potassium: 3.9 mmol/L (ref 3.5–5.1)
Sodium: 135 mmol/L (ref 135–145)
Total Bilirubin: 0.8 mg/dL (ref 0.3–1.2)
Total Protein: 7 g/dL (ref 6.5–8.1)

## 2018-12-24 LAB — RAPID URINE DRUG SCREEN, HOSP PERFORMED
Amphetamines: NOT DETECTED
Barbiturates: NOT DETECTED
Benzodiazepines: NOT DETECTED
Cocaine: POSITIVE — AB
Opiates: POSITIVE — AB
Tetrahydrocannabinol: POSITIVE — AB

## 2018-12-24 LAB — TROPONIN I (HIGH SENSITIVITY)
Troponin I (High Sensitivity): 4 ng/L (ref <18)
Troponin I (High Sensitivity): 3 ng/L (ref <18)

## 2018-12-24 LAB — ETHANOL: Alcohol, Ethyl (B): <10 mg/dL (ref <10)

## 2018-12-24 LAB — SARS CORONAVIRUS 2 (TAT 6-24 HRS): SARS Coronavirus 2: NEGATIVE

## 2018-12-24 LAB — LIPASE, BLOOD: Lipase: 29 U/L (ref 11–51)

## 2018-12-24 LAB — D-DIMER, QUANTITATIVE: D-Dimer, Quant: 0.29 ug/mL-FEU (ref 0.00–0.50)

## 2018-12-24 MED ORDER — ONDANSETRON HCL 4 MG/2ML IJ SOLN: 4.0000 mg | Freq: Four times a day (QID) | INTRAMUSCULAR | Status: DC | PRN

## 2018-12-24 MED ORDER — HYDROMORPHONE HCL 1 MG/ML IJ SOLN: 1.0000 mg | Freq: Once | INTRAMUSCULAR | Status: DC

## 2018-12-24 MED ORDER — QUETIAPINE FUMARATE 400 MG PO TABS
400.0000 mg | ORAL_TABLET | Freq: Every day | ORAL | Status: DC
  Administered 2018-12-25 (×2): 400 mg via ORAL
  Filled 2018-12-24 (×3): qty 1

## 2018-12-24 MED ORDER — ONDANSETRON HCL 4 MG/2ML IJ SOLN
4.0000 mg | Freq: Once | INTRAMUSCULAR | Status: AC
  Administered 2018-12-24: 4 mg via INTRAVENOUS
  Filled 2018-12-24: qty 2

## 2018-12-24 MED ORDER — MORPHINE SULFATE (PF) 4 MG/ML IV SOLN
4.0000 mg | Freq: Once | INTRAVENOUS | Status: AC
  Administered 2018-12-24: 4 mg via INTRAVENOUS
  Filled 2018-12-24: qty 1

## 2018-12-24 MED ORDER — ENOXAPARIN SODIUM 40 MG/0.4ML ~~LOC~~ SOLN
40.0000 mg | SUBCUTANEOUS | Status: DC
  Administered 2018-12-24 – 2018-12-25 (×2): 40 mg via SUBCUTANEOUS
  Filled 2018-12-24 (×3): qty 0.4

## 2018-12-24 MED ORDER — SENNOSIDES-DOCUSATE SODIUM 8.6-50 MG PO TABS: 1.0000 | ORAL_TABLET | Freq: Every evening | ORAL | Status: DC | PRN

## 2018-12-24 MED ORDER — NITROGLYCERIN 2 % TD OINT
1.0000 [in_us] | TOPICAL_OINTMENT | Freq: Once | TRANSDERMAL | Status: AC
  Administered 2018-12-24: 1 [in_us] via TOPICAL
  Filled 2018-12-24: qty 1

## 2018-12-24 MED ORDER — LIDOCAINE VISCOUS HCL 2 % MT SOLN
15.0000 mL | Freq: Once | OROMUCOSAL | Status: AC
  Administered 2018-12-24: 15 mL via ORAL
  Filled 2018-12-24: qty 15

## 2018-12-24 MED ORDER — IOHEXOL 350 MG/ML SOLN
100.0000 mL | Freq: Once | INTRAVENOUS | Status: AC | PRN
  Administered 2018-12-24: 100 mL via INTRAVENOUS

## 2018-12-24 MED ORDER — ACETAMINOPHEN 650 MG RE SUPP: 650.0000 mg | Freq: Four times a day (QID) | RECTAL | Status: DC | PRN

## 2018-12-24 MED ORDER — HYDROMORPHONE HCL 1 MG/ML IJ SOLN
1.0000 mg | Freq: Once | INTRAMUSCULAR | Status: AC
  Administered 2018-12-24: 1 mg via INTRAVENOUS
  Filled 2018-12-24: qty 1

## 2018-12-24 MED ORDER — LAMOTRIGINE 25 MG PO TABS
25.0000 mg | ORAL_TABLET | Freq: Every day | ORAL | Status: DC
  Administered 2018-12-24 – 2018-12-25 (×2): 25 mg via ORAL
  Filled 2018-12-24 (×3): qty 1

## 2018-12-24 MED ORDER — PANTOPRAZOLE SODIUM 40 MG PO TBEC
40.0000 mg | DELAYED_RELEASE_TABLET | Freq: Every day | ORAL | Status: DC
  Administered 2018-12-24 – 2018-12-26 (×3): 40 mg via ORAL
  Filled 2018-12-24 (×3): qty 1

## 2018-12-24 MED ORDER — ACETAMINOPHEN 325 MG PO TABS: 650.0000 mg | ORAL_TABLET | Freq: Four times a day (QID) | ORAL | Status: DC | PRN

## 2018-12-24 MED ORDER — HYDROMORPHONE HCL 1 MG/ML IJ SOLN
1.0000 mg | INTRAMUSCULAR | Status: DC | PRN
  Administered 2018-12-24 – 2018-12-25 (×4): 1 mg via INTRAVENOUS
  Filled 2018-12-24 (×4): qty 1

## 2018-12-24 MED ORDER — DICLOFENAC SODIUM 1 % TD GEL
2.0000 g | Freq: Four times a day (QID) | TRANSDERMAL | Status: DC
  Administered 2018-12-24 – 2018-12-26 (×6): 2 g via TOPICAL
  Filled 2018-12-24: qty 100

## 2018-12-24 MED ORDER — ALUM & MAG HYDROXIDE-SIMETH 200-200-20 MG/5ML PO SUSP
30.0000 mL | Freq: Once | ORAL | Status: AC
  Administered 2018-12-24: 30 mL via ORAL
  Filled 2018-12-24: qty 30

## 2018-12-24 MED ORDER — ONDANSETRON HCL 4 MG PO TABS: 4.0000 mg | ORAL_TABLET | Freq: Four times a day (QID) | ORAL | Status: DC | PRN

## 2018-12-24 NOTE — Progress Notes (Signed)
Patient alert and oriented, pt still c/o CP 8/10 pain med was given to patient in ED about 30 minutes before coming to the floor. MD notified no new order given. Will continue to monitor the patient.

## 2018-12-24 NOTE — Plan of Care (Signed)
  Problem: Safety: Goal: Ability to remain free from injury will improve Outcome: Progressing   Problem: Pain Managment: Goal: General experience of comfort will improve Outcome: Progressing   

## 2018-12-24 NOTE — ED Triage Notes (Signed)
Pt arrives to ED with complaints of 10/10 sharp mid sternal chest pain that radiates to his back starting around 2am-3am this morning. Patient also states about 2-3 days of chills and body aches.

## 2018-12-24 NOTE — ED Notes (Signed)
Gerald Stabs PA aware of chest pain and complaints of nausea and dizziness.

## 2018-12-24 NOTE — H&P (Signed)
Date: 12/24/2018               Patient Name:  [name] MRN: [id number]  DOB: January 11, 1961 Age / Sex: 58 y.o., male   PCP: [facility]         Medical Service: Internal Medicine Teaching Service         Attending Physician: Dr. Sandre Kitty, Elwin Mocha, MD    First Contact: Dr. Darl Pikes Pager: [phone]  Second Contact: Dr. Gwyneth Revels Pager: [phone]       After Hours (After 5p/  First Contact Pager: [phone]  weekends / holidays): Second Contact Pager: [phone]   Chief Complaint: chest pain  History of Present Illness: [name] is a 58 yo M w/ a PMHx of CAD, HTN, Hep C, polysubstance use and bipolar disorder who presents with chest pain that began around 2 in the morning when he woke up with severe left sided chest pain similar to prior chest pains for which he underwent cardiacstenting. He describes the pain as a constant stabbing pain that goes straight to his back. It is exacerbated by exertion and nothing makes it better. It hurts with each breath and is tender to palpation. He has a hx of heart burn which he is experiencing now as well. His acid reflux has recently worsened and he doesn't always take his protonix. He drinks alcohol occasionally and drinks coffee occasionally. He has associated dizziness, SOB and nausea. He never fainted but felt like he was about to faint. He also reports epigastric pain and diarrhea. He denies emesis. He denies blood in his stool or black stools but really hasn't been looking. He denies fevers, chills, rhinorrhea, sore throat, cough and sick contacts.   Meds:  Current Meds  Medication Sig   acetaminophen (TYLENOL) 500 MG tablet Take 500-1,000 mg by mouth 3 (three) times daily as needed (for pain).   aspirin EC 81 MG tablet Take 81 mg by mouth daily.   benzonatate (TESSALON) 100 MG capsule Take 1 capsule (100 mg total) by mouth 2 (two) times daily as needed for cough.   diltiazem (CARDIZEM) 60 MG tablet Take 120 mg by mouth daily.    gabapentin (NEURONTIN) 300 MG capsule Take 1 capsule (300 mg total) by mouth 3 (three) times daily. (Patient taking differently: Take 800 mg by mouth 3 (three) times daily. )   lamoTRIgine (LAMICTAL) 25 MG tablet Take 25 mg by mouth at bedtime.   pantoprazole (PROTONIX) 40 MG tablet Take 1 tablet (40 mg total) by mouth daily.   QUEtiapine (SEROQUEL) 400 MG tablet Take 400 mg by mouth at bedtime.    Allergies: Allergies as of 12/24/2018 - Review Complete 12/24/2018  Allergen Reaction Noted   Lactulose Hives and Nausea Only 03/18/2016   Lactose intolerance (gi) Hives and Nausea Only 03/18/2016   Past Medical History:  Diagnosis Date   Alcohol abuse    Anxiety    Arthritis    "all over" (11/25/2015)   Bipolar 1 disorder (HCC)    Bipolar affective disorder (HCC)    Chronic back pain    Chronic kidney disease    Cocaine abuse (HCC)    smokes crack-cocaine   Depression    GERD (gastroesophageal reflux disease)    Gout    Headache    "q couple days; stress, tension, anger" (11/25/2015)   Hemorrhoids    Hepatitis C    "not yet treated" (11/25/2015)   Hypertension    Insomnia  Myocardial infarction (HCC) 11/2015   "supposedly" (11/25/2015)   OSA on CPAP    "had mask; it was stolen" (11/25/2015)   PTSD (post-traumatic stress disorder)    Tobacco abuse    Vitamin D deficiency     Family History:   Mother: HTN, diabetes, arthritis Brother: HTN Brother: DM  Social History: Has been living in [place] and is back in town to visit family. Drinks occasionally but not often. Does not use drugs but does report smoking a joint last night but doesn't know what was in it. He smoked a pack/day for 48 years, quit 2 years ago and then started by a few weeks ago smoking about a half a pack/day.   Review of Systems: A complete ROS was negative except as per HPI.   Physical Exam: Blood pressure 126/88, pulse (!) 56, temperature 97.9 F (36.6 C), temperature source  Oral, resp. rate (!) 27, SpO2 98 %.  Physical Exam  Constitutional: He is oriented to person, place, and time. He appears distressed.  Tearful  HENT:  Head: Normocephalic and atraumatic.  Cardiovascular: Normal rate, regular rhythm, normal heart sounds and intact distal pulses.  No murmur heard. Pulmonary/Chest: Effort normal and breath sounds normal. No respiratory distress.  Abdominal: Soft. Bowel sounds are normal. He exhibits no distension. There is abdominal tenderness (TTP diffusely but most equisitely over the epigastrium ).  Neurological: He is alert and oriented to person, place, and time.  Skin: Skin is warm. He is diaphoretic. No erythema.  Nursing note and vitals reviewed.  Labs:  Results for orders placed or performed during the hospital encounter of 12/24/18 (from the past 24 hour(s))  Comprehensive metabolic panel     Status: Abnormal   Collection Time: 12/24/18  8:10 AM  Result Value Ref Range   Sodium 135 135 - 145 mmol/L   Potassium 3.9 3.5 - 5.1 mmol/L   Chloride 105 98 - 111 mmol/L   CO2 20 (L) 22 - 32 mmol/L   Glucose, Bld 110 (H) 70 - 99 mg/dL   BUN 14 6 - 20 mg/dL   Creatinine, Ser 8.92 0.61 - 1.24 mg/dL   Calcium 9.2 8.9 - 11.9 mg/dL   Total Protein 7.0 6.5 - 8.1 g/dL   Albumin 4.1 3.5 - 5.0 g/dL   AST 43 (H) 15 - 41 U/L   ALT 89 (H) 0 - 44 U/L   Alkaline Phosphatase 55 38 - 126 U/L   Total Bilirubin 0.8 0.3 - 1.2 mg/dL   GFR calc non Af Amer >60 >60 mL/min   GFR calc Af Amer >60 >60 mL/min   Anion gap 10 5 - 15  CBC with Differential     Status: None   Collection Time: 12/24/18  8:10 AM  Result Value Ref Range   WBC 9.1 4.0 - 10.5 K/uL   RBC 4.95 4.22 - 5.81 MIL/uL   Hemoglobin 14.4 13.0 - 17.0 g/dL   HCT 41.7 40.8 - 14.4 %   MCV 86.7 80.0 - 100.0 fL   MCH 29.1 26.0 - 34.0 pg   MCHC 33.6 30.0 - 36.0 g/dL   RDW 81.8 56.3 - 14.9 %   Platelets 274 150 - 400 K/uL   nRBC 0.0 0.0 - 0.2 %   Neutrophils Relative % 75 %   Neutro Abs 6.8 1.7 - 7.7  K/uL   Lymphocytes Relative 16 %   Lymphs Abs 1.4 0.7 - 4.0 K/uL   Monocytes Relative 7 %   Monocytes Absolute  0.6 0.1 - 1.0 K/uL   Eosinophils Relative 2 %   Eosinophils Absolute 0.2 0.0 - 0.5 K/uL   Basophils Relative 0 %   Basophils Absolute 0.0 0.0 - 0.1 K/uL   Immature Granulocytes 0 %   Abs Immature Granulocytes 0.04 0.00 - 0.07 K/uL  Urinalysis, Routine w reflex microscopic     Status: None   Collection Time: 12/24/18  8:10 AM  Result Value Ref Range   Color, Urine YELLOW YELLOW   APPearance CLEAR CLEAR   Specific Gravity, Urine 1.021 1.005 - 1.030   pH 5.0 5.0 - 8.0   Glucose, UA NEGATIVE NEGATIVE mg/dL   Hgb urine dipstick NEGATIVE NEGATIVE   Bilirubin Urine NEGATIVE NEGATIVE   Ketones, ur NEGATIVE NEGATIVE mg/dL   Protein, ur NEGATIVE NEGATIVE mg/dL   Nitrite NEGATIVE NEGATIVE   Leukocytes,Ua NEGATIVE NEGATIVE  Troponin I (High Sensitivity)     Status: None   Collection Time: 12/24/18  8:10 AM  Result Value Ref Range   Troponin I (High Sensitivity) 4 <18 ng/L  D-dimer, quantitative     Status: None   Collection Time: 12/24/18  8:10 AM  Result Value Ref Range   D-Dimer, Quant 0.29 0.00 - 0.50 ug/mL-FEU  Troponin I (High Sensitivity)     Status: None   Collection Time: 12/24/18 10:10 AM  Result Value Ref Range   Troponin I (High Sensitivity) 3 <18 ng/L  Lipase, blood     Status: None   Collection Time: 12/24/18  4:30 PM  Result Value Ref Range   Lipase 29 11 - 51 U/L  Urine rapid drug screen (hosp performed)     Status: Abnormal   Collection Time: 12/24/18  4:44 PM  Result Value Ref Range   Opiates POSITIVE (A) NONE DETECTED   Cocaine POSITIVE (A) NONE DETECTED   Benzodiazepines NONE DETECTED NONE DETECTED   Amphetamines NONE DETECTED NONE DETECTED   Tetrahydrocannabinol POSITIVE (A) NONE DETECTED   Barbiturates NONE DETECTED NONE DETECTED   EKG: personally reviewed my interpretation is NSR.   CXR: personally reviewed my interpretation is no widening of  the mediastinum    Assessment:  Mr. Sadowski is a 58 yo M w/ a hx of CAD, HTN, Hep C, polysubstance use and bipolar disorder who presents w/ chest pain and epigastric pain radiating to the back w/ associated SOB, nausea and diarrhea in setting of recent drug use and nonadherence to PPIs concerning for an emergent etiology.  Plan by Problem:  Active Problems:   Chest pain  Chest Pain/Abdominal Pain/Nausea/SOB: -pt w/ reported hx of CAD presents w/ chest pain that radiates to the back w/ associated dizziness, SOB and nausea w/ unremarkable EKG, no troponemia and a negative D-Dimer whose story is nevertheless concerning in the setting of recent unknown drug use, hx of alcohol and tobacco use and a concerning story and exam -pain unrelieved w/ nitro, morphine and dilaudid; chest x-ray w/ out widened mediastinum  -concern for aortic dissection given pain radiating to his back with dizziness and hx of HTN, high ADDRS given hx of cardiac catheterization, sudden onset, pain that radiates to back and soft bps; also concern for a gastric ulcer as patient has a hx of acid reflux w/out adherence to protonix w/ hx of sudden onset chest/abdominal pain w/ radiation to the back and associated diarrhea -CTA to evaluate for aortic dissection  -CT Abdomen Pelvis/lipase to evaluate for pancreatitis -FOBT to evaluate for GI bleeding in setting of acid reflux/chest pain/diarhea -IV dilaudid  for pain  -continue home protonix  -fluids and NPO  HTN:  -pt w/ hx of htn on diltiazem at home, bp low normal, holding  Bipolar Disorder -pt w/ hx of bipolar disorder on quetiapine, lamictal and gabapentin, continuing home meds   Polysubstance Use: -Alcohol level and UDS to evaluate for drug use given report of use of unknown drug last night   Dispo: Admit patient to Observation with expected length of stay less than 2 midnights.  Signed: Al Decant, MD 12/24/2018, 4:10 PM  Pager: [phone]

## 2018-12-24 NOTE — ED Provider Notes (Addendum)
[facility] EMERGENCY DEPARTMENT Provider Note   CSN: [id number] Arrival date & time: 12/24/18  2585     History   Chief Complaint Chief Complaint  Patient presents with  . Chest Pain    HPI [name] is a 58 y.o. male.     HPI Patient presents to the emergency department with chest pain starting at 2 AM.  The patient states he was awoken from sleep with midsternal chest pain that goes through to the back.  Patient states that nothing seems make the condition better or worse.  The patient states he does have some mild shortness of breath and nausea.  Patient states he said no vomiting.  Patient denies taking any other medications prior to arrival for his symptoms.  Patient was instructed by EMS to take a nitroglycerin and aspirin which she states did not relieve any of his pain.  Patient states the pain has been fairly constant since the onset.  The patient denies  headache,blurred vision, neck pain, fever, cough, weakness, numbness, dizziness, anorexia, edema, abdominal pain,vomiting, diarrhea, rash, back pain, dysuria, hematemesis, bloody stool, near syncope, or syncope. Past Medical History:  Diagnosis Date  . Alcohol abuse   . Anxiety   . Arthritis    "all over" (11/25/2015)  . Bipolar 1 disorder ([place])   . Bipolar affective disorder ([place])   . Chronic back pain   . Chronic kidney disease   . Cocaine abuse ([place])    smokes crack-cocaine  . Depression   . GERD (gastroesophageal reflux disease)   . Gout   . Headache    "q couple days; stress, tension, anger" (11/25/2015)  . Hemorrhoids   . Hepatitis C    "not yet treated" (11/25/2015)  . Hypertension   . Insomnia   . Myocardial infarction [facility]) 11/2015   "supposedly" (11/25/2015)  . OSA on CPAP    "had mask; it was stolen" (11/25/2015)  . PTSD (post-traumatic stress disorder)   . Tobacco abuse   . Vitamin D deficiency     Patient Active Problem List   Diagnosis Date Noted  . Acute encephalopathy  05/25/2016  . Cocaine abuse with cocaine-induced mood disorder ([place]) 03/20/2016  . Abnormal nuclear stress test   . Palpitations 11/26/2015  . Troponin level elevated 11/26/2015  . Homeless 11/26/2015  . Essential hypertension 11/26/2015  . SVT (supraventricular tachycardia) ([place])   . Pain in the chest 11/25/2015  . GERD (gastroesophageal reflux disease)   . Bipolar 1 disorder ([place])   . Back pain, chronic   . Chronic kidney disease   . Affective psychosis, bipolar ([place])   . Alcohol use disorder, severe, dependence ([place])   . Cocaine use disorder, severe, dependence ([place])   . Generalized abdominal pain   . Bipolar I disorder, most recent episode depressed ([place])   . Bipolar disorder, current episode depressed, severe, without psychotic features ([place])   . Bipolar affective disorder, depressed, severe ([place]) 03/26/2014  . Right hip pain   . Medically noncompliant 03/23/2014  . Dysuria 03/22/2013  . Headache 03/22/2013  . Polysubstance dependence ([place]) 03/21/2012  . Chest pain 02/23/2012  . Anxiety   . Bipolar affective disorder ([place])   . Depression   . Polysubstance abuse ([place])   . Gout   . Vitamin D deficiency   . Hepatitis C     Past Surgical History:  Procedure Laterality Date  . CARDIAC CATHETERIZATION N/A 11/29/2015   Procedure: Left Heart Cath and Coronary Angiography;  Surgeon: Laurey Morale, MD;  Location: [facility] INVASIVE CV LAB;  Service: Cardiovascular;  Laterality: N/A;  . FRACTURE SURGERY    . PATELLA FRACTURE SURGERY Left ~ 1995  . TONSILLECTOMY          Home Medications    Prior to Admission medications   Medication Sig Start Date End Date Taking? Authorizing Provider  acetaminophen (TYLENOL) 500 MG tablet Take 500-1,000 mg by mouth 3 (three) times daily as needed (for pain).    [provider]  aspirin EC 81 MG tablet Take 81 mg by mouth daily.    [provider]  benzonatate (TESSALON) 100 MG capsule Take 1 capsule (100 mg total) by mouth  2 (two) times daily as needed for cough. 05/27/16   Penny Pia, MD  cephALEXin (KEFLEX) 500 MG capsule Take 1 capsule (500 mg total) by mouth 4 (four) times daily. 08/07/16   Benjiman Core, MD  cetirizine (ZYRTEC) 10 MG tablet Take 10 mg by mouth daily.    [provider]  diltiazem (CARDIZEM) 60 MG tablet Take 120 mg by mouth daily.    [provider]  gabapentin (NEURONTIN) 300 MG capsule Take 1 capsule (300 mg total) by mouth 3 (three) times daily. Patient taking differently: Take 800 mg by mouth 3 (three) times daily.  02/01/15   Withrow, Everardo All, FNP  lamoTRIgine (LAMICTAL) 25 MG tablet Take 25 mg by mouth at bedtime.    [provider]  pantoprazole (PROTONIX) 40 MG tablet Take 1 tablet (40 mg total) by mouth daily. 02/01/15   Withrow, Everardo All, FNP  QUEtiapine (SEROQUEL) 400 MG tablet Take 400 mg by mouth at bedtime.    [provider]    Family History Family History  Problem Relation Age of Onset  . Hypertension Brother   . Diabetes Brother   . Hypertension Mother   . Diabetes Mother   . Arthritis Mother   . Other Sister        bone disease    Social History Social History   Tobacco Use  . Smoking status: Current Every Day Smoker    Packs/day: 0.12    Years: 46.00    Pack years: 5.52  . Smokeless tobacco: Never Used  Substance Use Topics  . Alcohol use: Yes    Comment: 11/25/2015 "quit alcohol 6 months ago; used to drink from morning til night"  . Drug use: Yes    Types: Marijuana, "Crack" cocaine, Cocaine    Comment: states that he uses marijuana and cocaine every now and then      Allergies   Lactulose and Lactose intolerance (gi)   Review of Systems Review of Systems  All other systems negative except as documented in the HPI. All pertinent positives and negatives as reviewed in the HPI. Physical Exam Updated Vital Signs BP 126/88   Pulse (!) 56   Temp 97.9 F (36.6 C) (Oral)   Resp (!) 27   SpO2 98%   Physical  Exam Vitals signs and nursing note reviewed.  Constitutional:      General: He is not in acute distress.    Appearance: He is well-developed.  HENT:     Head: Normocephalic and atraumatic.  Eyes:     Pupils: Pupils are equal, round, and reactive to light.  Neck:     Musculoskeletal: Normal range of motion and neck supple.  Cardiovascular:     Rate and Rhythm: Normal rate and regular rhythm.     Heart  sounds: Normal heart sounds. No murmur. No friction rub. No gallop.   Pulmonary:     Effort: Pulmonary effort is normal. No respiratory distress.     Breath sounds: Normal breath sounds. No decreased breath sounds, wheezing, rhonchi or rales.  Abdominal:     General: Bowel sounds are normal. There is no distension.     Palpations: Abdomen is soft.     Tenderness: There is no abdominal tenderness.  Skin:    General: Skin is warm and dry.     Capillary Refill: Capillary refill takes less than 2 seconds.     Findings: No erythema or rash.  Neurological:     Mental Status: He is alert and oriented to person, place, and time.     Motor: No abnormal muscle tone.     Coordination: Coordination normal.  Psychiatric:        Behavior: Behavior normal.      ED Treatments / Results  Labs (all labs ordered are listed, but only abnormal results are displayed) Labs Reviewed  COMPREHENSIVE METABOLIC PANEL - Abnormal; Notable for the following components:      Result Value   CO2 20 (*)    Glucose, Bld 110 (*)    AST 43 (*)    ALT 89 (*)    All other components within normal limits  SARS CORONAVIRUS 2 (TAT 6-24 HRS)  CBC WITH DIFFERENTIAL/PLATELET  URINALYSIS, ROUTINE W REFLEX MICROSCOPIC  D-DIMER, QUANTITATIVE (NOT AT [facility])  TROPONIN I (HIGH SENSITIVITY)  TROPONIN I (HIGH SENSITIVITY)    EKG EKG Interpretation  Date/Time:  Tuesday December 24 2018 07:26:13 EDT Ventricular Rate:  84 PR Interval:  188 QRS Duration: 84 QT Interval:  362 QTC Calculation: 427 R Axis:   23 Text  Interpretation:  Normal sinus rhythm Normal ECG No significant change since last tracing Confirmed by Richardean Canal [id number]) on 12/24/2018 8:54:51 AM   Radiology Dg Chest 2 View  Result Date: 12/24/2018 CLINICAL DATA:  Acute onset of chest pain and dizziness this morning. Previous myocardial infarct. EXAM: CHEST - 2 VIEW COMPARISON:  05/25/2016 FINDINGS: The heart size and mediastinal contours are within normal limits. Both lungs are clear. The visualized skeletal structures are unremarkable. IMPRESSION: No active cardiopulmonary disease. Electronically Signed   By: Danae Orleans M.D.   On: 12/24/2018 08:39    Procedures Procedures (including critical care time)  Medications Ordered in ED Medications  morphine 4 MG/ML injection 4 mg (4 mg Intravenous Given 12/24/18 0853)  ondansetron (ZOFRAN) injection 4 mg (4 mg Intravenous Given 12/24/18 0855)  alum & mag hydroxide-simeth (MAALOX/MYLANTA) 200-200-20 MG/5ML suspension 30 mL (30 mLs Oral Given 12/24/18 1237)    And  lidocaine (XYLOCAINE) 2 % viscous mouth solution 15 mL (15 mLs Oral Given 12/24/18 1237)  HYDROmorphone (DILAUDID) injection 1 mg (1 mg Intravenous Given 12/24/18 1407)  nitroGLYCERIN (NITROGLYN) 2 % ointment 1 inch (1 inch Topical Given 12/24/18 1429)     Initial Impression / Assessment and Plan / ED Course  I have reviewed the triage vital signs and the nursing notes.  Pertinent labs & imaging results that were available during my care of the patient were reviewed by me and considered in my medical decision making (see chart for details).        I spoke with Dr. Mayford Knife of cardiology who advises that the patient should be admitted to the admitting team with consultation from them.  The patient was given Nitropaste and pain control.  Patient has  been stable here in the emergency department.  Did not start a nitro drip to his blood pressure. I called cardiology back to ascertain if they were going to consult and they advised to  have the internal medicine residents order consult once he is admitted. Final Clinical Impressions(s) / ED Diagnoses   Final diagnoses:  Unstable angina [facility])    ED Discharge Orders    None       Charlestine Night, PA-C 12/24/18 1451    [address] [place], PA-C 12/24/18 1453    Charlynne Pander, MD 12/24/18 [phone]

## 2018-12-25 DIAGNOSIS — I2510 Atherosclerotic heart disease of native coronary artery without angina pectoris: Secondary | ICD-10-CM

## 2018-12-25 DIAGNOSIS — K219 Gastro-esophageal reflux disease without esophagitis: Secondary | ICD-10-CM | POA: Diagnosis present

## 2018-12-25 DIAGNOSIS — A0811 Acute gastroenteropathy due to Norwalk agent: Secondary | ICD-10-CM | POA: Diagnosis present

## 2018-12-25 DIAGNOSIS — Z833 Family history of diabetes mellitus: Secondary | ICD-10-CM | POA: Diagnosis not present

## 2018-12-25 DIAGNOSIS — R197 Diarrhea, unspecified: Secondary | ICD-10-CM

## 2018-12-25 DIAGNOSIS — F1490 Cocaine use, unspecified, uncomplicated: Secondary | ICD-10-CM

## 2018-12-25 DIAGNOSIS — I252 Old myocardial infarction: Secondary | ICD-10-CM | POA: Diagnosis not present

## 2018-12-25 DIAGNOSIS — Z7982 Long term (current) use of aspirin: Secondary | ICD-10-CM | POA: Diagnosis not present

## 2018-12-25 DIAGNOSIS — F1290 Cannabis use, unspecified, uncomplicated: Secondary | ICD-10-CM

## 2018-12-25 DIAGNOSIS — G4733 Obstructive sleep apnea (adult) (pediatric): Secondary | ICD-10-CM | POA: Diagnosis present

## 2018-12-25 DIAGNOSIS — Z79899 Other long term (current) drug therapy: Secondary | ICD-10-CM

## 2018-12-25 DIAGNOSIS — Z23 Encounter for immunization: Secondary | ICD-10-CM | POA: Diagnosis not present

## 2018-12-25 DIAGNOSIS — M549 Dorsalgia, unspecified: Secondary | ICD-10-CM | POA: Diagnosis present

## 2018-12-25 DIAGNOSIS — R110 Nausea: Secondary | ICD-10-CM | POA: Diagnosis not present

## 2018-12-25 DIAGNOSIS — F319 Bipolar disorder, unspecified: Secondary | ICD-10-CM

## 2018-12-25 DIAGNOSIS — I10 Essential (primary) hypertension: Secondary | ICD-10-CM

## 2018-12-25 DIAGNOSIS — F1990 Other psychoactive substance use, unspecified, uncomplicated: Secondary | ICD-10-CM | POA: Diagnosis present

## 2018-12-25 DIAGNOSIS — R079 Chest pain, unspecified: Secondary | ICD-10-CM

## 2018-12-25 DIAGNOSIS — E739 Lactose intolerance, unspecified: Secondary | ICD-10-CM | POA: Diagnosis present

## 2018-12-25 DIAGNOSIS — Z955 Presence of coronary angioplasty implant and graft: Secondary | ICD-10-CM | POA: Diagnosis not present

## 2018-12-25 DIAGNOSIS — Z8249 Family history of ischemic heart disease and other diseases of the circulatory system: Secondary | ICD-10-CM | POA: Diagnosis not present

## 2018-12-25 DIAGNOSIS — N189 Chronic kidney disease, unspecified: Secondary | ICD-10-CM | POA: Diagnosis present

## 2018-12-25 DIAGNOSIS — R1013 Epigastric pain: Secondary | ICD-10-CM | POA: Diagnosis present

## 2018-12-25 DIAGNOSIS — F17210 Nicotine dependence, cigarettes, uncomplicated: Secondary | ICD-10-CM | POA: Diagnosis present

## 2018-12-25 DIAGNOSIS — I200 Unstable angina: Secondary | ICD-10-CM | POA: Diagnosis present

## 2018-12-25 DIAGNOSIS — Z66 Do not resuscitate: Secondary | ICD-10-CM

## 2018-12-25 DIAGNOSIS — I129 Hypertensive chronic kidney disease with stage 1 through stage 4 chronic kidney disease, or unspecified chronic kidney disease: Secondary | ICD-10-CM | POA: Diagnosis present

## 2018-12-25 DIAGNOSIS — I25110 Atherosclerotic heart disease of native coronary artery with unstable angina pectoris: Secondary | ICD-10-CM | POA: Diagnosis present

## 2018-12-25 DIAGNOSIS — F419 Anxiety disorder, unspecified: Secondary | ICD-10-CM | POA: Diagnosis present

## 2018-12-25 DIAGNOSIS — Z8261 Family history of arthritis: Secondary | ICD-10-CM | POA: Diagnosis not present

## 2018-12-25 DIAGNOSIS — Z20828 Contact with and (suspected) exposure to other viral communicable diseases: Secondary | ICD-10-CM | POA: Diagnosis present

## 2018-12-25 DIAGNOSIS — F1190 Opioid use, unspecified, uncomplicated: Secondary | ICD-10-CM

## 2018-12-25 DIAGNOSIS — G8929 Other chronic pain: Secondary | ICD-10-CM | POA: Diagnosis present

## 2018-12-25 DIAGNOSIS — B1920 Unspecified viral hepatitis C without hepatic coma: Secondary | ICD-10-CM

## 2018-12-25 DIAGNOSIS — Z8619 Personal history of other infectious and parasitic diseases: Secondary | ICD-10-CM | POA: Diagnosis not present

## 2018-12-25 LAB — COMPREHENSIVE METABOLIC PANEL
ALT: 119 U/L — ABNORMAL HIGH (ref 0–44)
AST: 60 U/L — ABNORMAL HIGH (ref 15–41)
Albumin: 3.8 g/dL (ref 3.5–5.0)
Alkaline Phosphatase: 52 U/L (ref 38–126)
Anion gap: 9 (ref 5–15)
BUN: 12 mg/dL (ref 6–20)
CO2: 23 mmol/L (ref 22–32)
Calcium: 9.1 mg/dL (ref 8.9–10.3)
Chloride: 105 mmol/L (ref 98–111)
Creatinine, Ser: 1.02 mg/dL (ref 0.61–1.24)
GFR calc Af Amer: >60 mL/min (ref >60)
GFR calc non Af Amer: >60 mL/min (ref >60)
Glucose, Bld: 112 mg/dL — ABNORMAL HIGH (ref 70–99)
Potassium: 3.4 mmol/L — ABNORMAL LOW (ref 3.5–5.1)
Sodium: 137 mmol/L (ref 135–145)
Total Bilirubin: 0.7 mg/dL (ref 0.3–1.2)
Total Protein: 6.7 g/dL (ref 6.5–8.1)

## 2018-12-25 LAB — CBC
HCT: 42.6 % (ref 39.0–52.0)
Hemoglobin: 14.3 g/dL (ref 13.0–17.0)
MCH: 28.9 pg (ref 26.0–34.0)
MCHC: 33.6 g/dL (ref 30.0–36.0)
MCV: 86.1 fL (ref 80.0–100.0)
Platelets: 250 10*3/uL (ref 150–400)
RBC: 4.95 MIL/uL (ref 4.22–5.81)
RDW: 13.8 % (ref 11.5–15.5)
WBC: 6.8 10*3/uL (ref 4.0–10.5)
nRBC: 0 % (ref 0.0–0.2)

## 2018-12-25 LAB — HIV ANTIBODY (ROUTINE TESTING W REFLEX): HIV Screen 4th Generation wRfx: NONREACTIVE

## 2018-12-25 MED ORDER — LIDOCAINE VISCOUS HCL 2 % MT SOLN: 15.0000 mL | Freq: Four times a day (QID) | OROMUCOSAL | Status: DC | PRN

## 2018-12-25 MED ORDER — ALUM & MAG HYDROXIDE-SIMETH 200-200-20 MG/5ML PO SUSP: 30.0000 mL | Freq: Four times a day (QID) | ORAL | Status: DC | PRN

## 2018-12-25 MED ORDER — ALUM & MAG HYDROXIDE-SIMETH 200-200-20 MG/5ML PO SUSP
30.0000 mL | Freq: Once | ORAL | Status: AC
  Administered 2018-12-25: 30 mL via ORAL
  Filled 2018-12-25: qty 30

## 2018-12-25 MED ORDER — OXYCODONE HCL 5 MG PO TABS
5.0000 mg | ORAL_TABLET | ORAL | Status: DC | PRN
  Administered 2018-12-25 – 2018-12-26 (×3): 5 mg via ORAL
  Filled 2018-12-25 (×3): qty 1

## 2018-12-25 MED ORDER — LIDOCAINE VISCOUS HCL 2 % MT SOLN
15.0000 mL | Freq: Once | OROMUCOSAL | Status: AC
  Administered 2018-12-25: 15 mL via ORAL
  Filled 2018-12-25: qty 15

## 2018-12-25 MED ORDER — GABAPENTIN 300 MG PO CAPS
300.0000 mg | ORAL_CAPSULE | Freq: Three times a day (TID) | ORAL | Status: DC
  Administered 2018-12-25 – 2018-12-26 (×4): 300 mg via ORAL
  Filled 2018-12-25 (×4): qty 1

## 2018-12-25 NOTE — Progress Notes (Signed)
Went room to give patient morning medicine asked him about his pain level patient get upset he stated heI is tired of everybody asking him the same question, he wish he go to [facility] instead of coming here,  Told  Patient I am here to give him his meds patient told me to get out of his room. MD paged awaiting MD call back. Will continue to monitor the patient.

## 2018-12-25 NOTE — Progress Notes (Addendum)
  Date: 12/25/2018  Patient name: [name]  Medical record number: [id number]  Date of birth: Sep 19, 1960   I have seen and evaluated this patient and I have discussed the plan of care with the house staff. Please see their note for complete details. I concur with their findings with the following additions/corrections:   58 year old man with a history of CAD, HTN, hepatitis C, GERD, substance use, and bipolar disorder admitted for chest pain radiating to the back as well as epigastric pain.  The chest pain woke him from sleep at 2 AM the day of admission.  Since presentation, the chest pain has improved, but he continues to have epigastric pain radiating to the back.  He also now is having increasing nausea, 1 episode of vomiting, and multiple episodes of diarrhea.  On exam, he is lying in bed and appears uncomfortable.  His cardiac and pulmonary exams are normal, he has mild epigastric and periumbilical tenderness with no rebound or guarding, and normal bowel sounds.  Significant test results include: High-sensitivity troponin IV, 3 AST/ALT 43/89, 60/119 (consistent with baseline) Lipase 29 CBC unremarkable D-dimer 0.29 UA normal Urine tox positive for opiates, cocaine, THC (collected after administration of opioids in ED) EtOH negative  EKG with no acute changes CTA chest and abdomen with no evidence of PE or aortic dissection, diverticulosis without diverticulitis  In summary, this is a 58 year old man with a history of CAD, HTN, hepatitis C, GERD, substance abuse, and bipolar disorder admitted with chest pain radiating to the back and epigastric pain with progressive nausea and diarrhea.  At this point, we have ruled out life-threatening causes including ACS, PE, aortic dissection, pancreatitis, and cholecystitis.  No evidence on EKG of pericarditis.  Suspect possible GERD or gastritis, possibly PUD, versus gastroenteritis.  We will try to control his pain with GI cocktail and  transition to oral pain medicines if possible.  If not improving, may need to consult GI for further evaluation of his epigastric pain.  Given that he is still requiring IV pain medications, we will change his status to inpatient as he will require at least another midnight in the hospital.  Lenice Pressman, M.D., Ph.D. 12/25/2018, 4:02 PM

## 2018-12-25 NOTE — Plan of Care (Signed)
  Problem: Clinical Measurements: Goal: Respiratory complications will improve Outcome: Progressing   Problem: Activity: Goal: Risk for activity intolerance will decrease Outcome: Progressing   Problem: Coping: Goal: Level of anxiety will decrease Outcome: Progressing   Problem: Safety: Goal: Ability to remain free from injury will improve Outcome: Progressing   

## 2018-12-25 NOTE — Progress Notes (Signed)
   Subjective:  [name] was seen at bedside. He reports that he is still having back pain and his pain is progressing. He reports that the dilaudid helped, brought the pain from 8/10 to 7/10. He reports that he vomited last night and described the vomitus as "dark" and is still having diarrhea. We reassured him on his imaging results and discussed providing a GI cocktail. He was agreeable. All questions and concerns were addressed.   Objective:  Vital signs in last 24 hours: Vitals:   12/24/18 2041 12/25/18 0100 12/25/18 0413 12/25/18 0809  BP: 128/83 133/83 114/63 101/74  Pulse: 61 (!) 103 78 86  Resp:  16 18 18   Temp:  (!) 97.2 F (36.2 C) (!) 97.1 F (36.2 C) 98.6 F (37 C)  TempSrc:  Oral Oral   SpO2:    95%  Weight:  111.3 kg    Height:        General: Middle aged male, laying in bed Cardiac: RRR, normal heart sounds, no murmurs, rubs, or gallops Pulmonary: Regular breath sounds auscultated bilaterally, no wheezes, rhonchi, or rales. Abdomen: General tenderness to palpation in the epigastric area, normal bowel sounds auscultated.   Extremity:skin is warm, no pitting edema or rashes.     Assessment/Plan:  Active Problems:   Chest pain  Chest Pain/Abdominal Pain/Nausea/SOB: - Pt w/ reported hx of CAD presents w/ chest pain that radiates to the back w/ associated dizziness, SOB and nausea w/ unremarkable EKG.   - CTA findings negative for PE - CT Pelvis is negative for AAA or aortic dissection. - Lipase: 29 - FOBT: Pending - Continue IV Dilaudid PRN  - Continue protonix  - In presence of initially negative lab results, Maalox/Mylanta and lidocaine cocktail will be ordered at this time.   HTN:  - Continue holding home medications in light of normal BP.   Bipolar Disorder - Continue Lamictal - Continue Seroquel  - Restarting gabapentin 300 mg TID  Polysubstance Use: - UDS: + cocaine, cannabis, opioids  FEN: Heart Diet  VTE ppx: Lovenox Code Status: DNR     Dispo: Anticipated discharge pending medical course.   [name] Mercury MD 12/25/2018, 10:02 AM Pager: [phone]

## 2018-12-26 ENCOUNTER — Encounter (HOSPITAL_COMMUNITY): Payer: Self-pay | Admitting: Nurse Practitioner

## 2018-12-26 DIAGNOSIS — A0811 Acute gastroenteropathy due to Norwalk agent: Secondary | ICD-10-CM | POA: Diagnosis not present

## 2018-12-26 LAB — C DIFFICILE QUICK SCREEN W PCR REFLEX
C Diff antigen: NEGATIVE
C Diff interpretation: NOT DETECTED
C Diff toxin: NEGATIVE

## 2018-12-26 LAB — OCCULT BLOOD X 1 CARD TO LAB, STOOL: Fecal Occult Bld: NEGATIVE

## 2018-12-26 MED ORDER — GABAPENTIN 300 MG PO CAPS: 300.0000 mg | ORAL_CAPSULE | Freq: Three times a day (TID) | ORAL | 0 refills | Status: DC

## 2018-12-26 MED ORDER — INFLUENZA VAC SPLIT QUAD 0.5 ML IM SUSY
0.5000 mL | PREFILLED_SYRINGE | Freq: Once | INTRAMUSCULAR | Status: AC
  Administered 2018-12-26: 0.5 mL via INTRAMUSCULAR
  Filled 2018-12-26 (×2): qty 0.5

## 2018-12-26 MED ORDER — ONDANSETRON HCL 4 MG PO TABS: 4.0000 mg | ORAL_TABLET | Freq: Every day | ORAL | 0 refills | Status: AC | PRN

## 2018-12-26 MED ORDER — LACTATED RINGERS IV BOLUS
500.0000 mL | Freq: Once | INTRAVENOUS | Status: AC
  Administered 2018-12-26: 15:00:00 via INTRAVENOUS

## 2018-12-26 NOTE — Progress Notes (Signed)
  Date: 12/26/2018  Patient name: [name]  Medical record number: [id number]  Date of birth: Dec 10, 1960   I have seen and evaluated this patient and I have discussed the plan of care with the house staff. Please see their note for complete details. I concur with their findings with the following additions/corrections:   Much improved, plan for discharge today, but still reluctant. Will recheck this afternoon and hopefully he will feel ready.  Lenice Pressman, M.D., Ph.D. 12/26/2018, 4:37 PM

## 2018-12-26 NOTE — Progress Notes (Signed)
Patient states his dizziness resolved after getting the IV fluids.

## 2018-12-26 NOTE — Progress Notes (Signed)
Patient complaining of dizziness and light headedness. Vital signs obtained, BP 128/92, HR 89. Stated it has lasted approximately an hour and he is still dizzy. Dr. Gilford Rile made aware, received an order to check orthostatic vital signs. Will continue to monitor.

## 2018-12-26 NOTE — TOC Progression Note (Signed)
Transition of Care [facility]) - Progression Note    Patient Details  Name: [name] MRN: [id number] Date of Birth: 02-11-1961  [facility] CM/SW Contact  Zenon Mayo, RN Phone Number: 12/26/2018, 1:08 PM  Clinical Narrative:    Patient states he lives in [place] and he is here visiting in [place] , staying at the [facility] Extended stay hotel, he states he will need cab voucher to get back to hotel where his wallet is. He goes to [facility] for his medications.  NCM notified April in [place] on 9/16 patient is here.  His CSW is Nira Conn his PCP is Dr. Ishmael Holter.    Expected Discharge Plan: Home/Self Care Barriers to Discharge: No Barriers Identified  Expected Discharge Plan and Services Expected Discharge Plan: Home/Self Care In-house Referral: NA Discharge Planning Services: CM Consult Post Acute Care Choice: NA Living arrangements for the past 2 months: Hotel/Motel                 DME Arranged: (NA)                     Social Determinants of Health (SDOH) Interventions    Readmission Risk Interventions No flowsheet data found.

## 2018-12-26 NOTE — Progress Notes (Signed)
   Subjective:  [name] was seen at bedside. He reports that he is feeling a lot better today. He states that he was able to sleep throughout the night. He also endorses that his stomach cramping is resolving and that his pain has considerably decreased. We spoke with him about the possible etiologies causing his diarrhea and vomiting, with infectious being high on the differential. We are waiting for his GI panel and C. Diff. Cultures. All questions and concerns were addressed.     Objective:  Vital signs in last 24 hours: Vitals:   12/25/18 2106 12/26/18 0041 12/26/18 0520 12/26/18 1036  BP: 118/84 110/81 115/88 (!) 128/92  Pulse: 77 85 75 89  Resp: 16 18 20    Temp: 98 F (36.7 C) 98.3 F (36.8 C) (!) 97.5 F (36.4 C)   TempSrc: Oral Oral Oral   SpO2: 95% 94% 96%   Weight:  111.4 kg    Height:        Physical Exam Vitals signs and nursing note reviewed.  Constitutional:      Appearance: He is obese.     Comments: Sitting comfortably in bed.   HENT:     Head: Normocephalic and atraumatic.  Cardiovascular:     Rate and Rhythm: Normal rate and regular rhythm.     Heart sounds: Normal heart sounds. Heart sounds not distant. No murmur. No systolic murmur. No diastolic murmur.  Pulmonary:     Effort: Pulmonary effort is normal. No tachypnea, accessory muscle usage or respiratory distress.     Breath sounds: Normal breath sounds. No stridor.  Neurological:     Mental Status: He is alert.    Assessment/Plan:  Principal Problem:   Abdominal pain, epigastric Active Problems:   Bipolar affective disorder (HCC)   Polysubstance abuse (HCC)   Chest pain  Chest Pain/Abdominal Pain/Nausea/SOB: - Pt w/ reported hx of CAD presents w/ chest pain that radiates to the back w/ associated dizziness, SOB and nausea w/ unremarkable EKG.   - FOBT: Negative - Continue IV Dilaudid PRN  - Continue protonix   Diarrhea: - GI Panel: Pending - C. Diff: Pending  HTN:  - Continue  holding home medications in light of normal BP.   Bipolar Disorder - Continue Lamictal 25 mg QD - Continue Seroquel 400 mg  - Continue gabapentin 300 mg TID  Polysubstance Use: - UDS: + cocaine, cannabis, and opioids  FEN: Heart Diet  VTE ppx: Lovenox Code Status: DNR    Dispo: Anticipated discharge pending medical course.   Maudie Mercury MD 12/26/2018, 11:42 AM Pager: [phone]

## 2018-12-30 LAB — GASTROINTESTINAL PANEL BY PCR, STOOL (REPLACES STOOL CULTURE)

## 2019-01-08 NOTE — Discharge Summary (Addendum)
Name: [name] MRN: [id number] DOB: 1960-12-07 58 y.o. PCP: Clinic, [facility]  Date of Admission: 12/24/2018  7:24 AM Date of Discharge: 12/26/2018 Attending Physician: Lenice Pressman, MD Discharge Diagnosis: 1. Chest Pain/Abdominal pain/nausea/SOB 2. Diarrhea 2/2 Norovirus  Discharge Medications: Allergies as of 12/26/2018      Reactions   Lactulose Hives, Nausea Only   Lactose Intolerance (gi) Hives, Nausea Only      Medication List    TAKE these medications   acetaminophen 500 MG tablet Commonly known as: TYLENOL Take 500-1,000 mg by mouth 3 (three) times daily as needed (for pain).   amLODipine 10 MG tablet Commonly known as: NORVASC Take 10 mg by mouth daily.   aspirin EC 81 MG tablet Take 81 mg by mouth daily.   atenolol 25 MG tablet Commonly known as: TENORMIN Take 25 mg by mouth daily.   baclofen 10 MG tablet Commonly known as: LIORESAL Take 10 mg by mouth 3 (three) times daily as needed.   benzonatate 100 MG capsule Commonly known as: TESSALON Take 1 capsule (100 mg total) by mouth 2 (two) times daily as needed for cough.   cetirizine 10 MG tablet Commonly known as: ZYRTEC Take 10 mg by mouth daily.   clopidogrel 75 MG tablet Commonly known as: PLAVIX Take 75 mg by mouth daily.   dicyclomine 20 MG tablet Commonly known as: BENTYL Take 20 mg by mouth as needed (abdominal cramps).   diltiazem 60 MG tablet Commonly known as: CARDIZEM Take 120 mg by mouth daily.   diphenhydrAMINE 25 MG tablet Commonly known as: BENADRYL Take 25 mg by mouth at bedtime as needed for sleep.   Fish Oil 1000 MG Caps Take 2,000 mg by mouth daily.   gabapentin 300 MG capsule Commonly known as: NEURONTIN Take 1 capsule (300 mg total) by mouth 3 (three) times daily. What changed: how much to take   gabapentin 300 MG capsule Commonly known as: Neurontin Take 1 capsule (300 mg total) by mouth 3 (three) times daily. What changed: You were already taking  a medication with the same name, and this prescription was added. Make sure you understand how and when to take each.   lamoTRIgine 25 MG tablet Commonly known as: LAMICTAL Take 25 mg by mouth at bedtime.   lisinopril 40 MG tablet Commonly known as: ZESTRIL Take 40 mg by mouth daily.   Melatonin 3 MG Tabs Take 6 mg by mouth as needed.   naloxone 4 MG/0.1ML Liqd nasal spray kit Commonly known as: NARCAN Place 4 mg into the nose once.   ondansetron 4 MG tablet Commonly known as: Zofran Take 1 tablet (4 mg total) by mouth daily as needed for nausea or vomiting.   pantoprazole 40 MG tablet Commonly known as: PROTONIX Take 1 tablet (40 mg total) by mouth daily.   pravastatin 20 MG tablet Commonly known as: PRAVACHOL Take 20 mg by mouth daily.       Disposition and follow-up:   [name] was discharged from [facility] in Stable condition.  At the hospital follow up visit please address:  1.  Drug useage  2.  Labs / imaging needed at time of follow-up: N/A  3.  Pending labs/ test needing follow-up: N/A  Follow-up Appointments: Follow-up Information    Clinic, [place]. Schedule an appointment as soon as possible for a visit.   Why: Please make an appointment within 1-2 weeks.  Contact information: [address]  [place] [address] [phone]           Hospital Course by problem list: 1. Chest Pain/Abdominal pain/nausea/SOB Mr. Author Hatlestad was admitted to [facility] after experiencing chest pain at 2:00 am. He stated that this felt similar to the pain that caused him to undergo cardiac stenting. He states that each breath exacerbates his pain, as well as pain on exertion. His urine drug screen was positive for cocaine, cannabis, and opiates. He underwent a CT angio of the chest which showed no aortic aneurysm or dissection. His troponins were negative and stable at 3. His EKG showed normal sinus rhythm. He was  given IV Dilaudid for pain. His pain resolved and he was discharged with zofran.   2. Diarrhea 2/2 to Norovirus:  During his patient intake, [name] reported having numerous bouts of diarrhea. He was given IV fluids, and underwent a GI panel and C diff panel. His GI panel came back positive for norovirus. His bowel movements stabilized over the course of several days and he was discharged home.    Discharge Vitals:   BP (!) 128/92 (BP Location: Left Arm)   Pulse 89   Temp (!) 97.5 F (36.4 C) (Oral)   Resp 20   Ht _0  (1.702 m)   Wt 111.4 kg   SpO2 96%   BMI 38.45 kg/m   Pertinent Labs, Studies, and Procedures:   Ref Range & Units 2wk ago (12/24/18) 72yrago (05/25/16) 225yrgo (03/18/16) 3y30yro (11/25/15)  Opiates NONE DETECTED POSITIVEAbnormal   POSITIVEAbnormal   NONE DETECTED  NONE DETECTED   Cocaine NONE DETECTED POSITIVEAbnormal   NONE DETECTED  POSITIVEAbnormal   NONE DETECTED   Benzodiazepines NONE DETECTED NONE DETECTED  NONE DETECTED  NONE DETECTED  NONE DETECTED   Amphetamines NONE DETECTED NONE DETECTED  NONE DETECTED  NONE DETECTED  NONE DETECTED   Tetrahydrocannabinol NONE DETECTED POSITIVEAbnormal   POSITIVEAbnormal   POSITIVEAbnormal   NONE DETECTED   Barbiturates NONE DETECTED NONE DETECTED  NONE DETECTED CM  NONE DETECTED CM     2wk ago (12/24/18) 2wk ago (12/24/18)    Troponin I (High Sensitivity) <18 ng/L 3  4 CM    Campylobacter species NOT DETECTED NOT DETECTED   Plesimonas shigelloides NOT DETECTED NOT DETECTED   Salmonella species NOT DETECTED NOT DETECTED   Yersinia enterocolitica NOT DETECTED NOT DETECTED   Vibrio species NOT DETECTED NOT DETECTED   Vibrio cholerae NOT DETECTED NOT DETECTED   Enteroaggregative E coli (EAEC) NOT DETECTED NOT DETECTED   Enteropathogenic E coli (EPEC) NOT DETECTED NOT DETECTED   Enterotoxigenic E coli (ETEC) NOT DETECTED NOT DETECTED   Shiga like toxin producing E coli (STEC) NOT DETECTED NOT DETECTED    Shigella/Enteroinvasive E coli (EIEC) NOT DETECTED NOT DETECTED   Cryptosporidium NOT DETECTED NOT DETECTED   Cyclospora cayetanensis NOT DETECTED NOT DETECTED   Entamoeba histolytica NOT DETECTED NOT DETECTED   Giardia lamblia NOT DETECTED NOT DETECTED   Adenovirus F40/41 NOT DETECTED NOT DETECTED   Astrovirus NOT DETECTED NOT DETECTED   Norovirus GI/GII NOT DETECTED DETECTEDAbnormal      Discharge Instructions: Discharge Instructions    Call MD for:  difficulty breathing, headache or visual disturbances   Complete by: As directed    Call MD for:  extreme fatigue   Complete by: As directed    Call MD for:  hives   Complete by: As directed    Call MD for:  persistant dizziness or light-headedness  Complete by: As directed    Call MD for:  persistant nausea and vomiting   Complete by: As directed    Call MD for:  redness, tenderness, or signs of infection (pain, swelling, redness, odor or green/yellow discharge around incision site)   Complete by: As directed    Call MD for:  severe uncontrolled pain   Complete by: As directed    Call MD for:  temperature >100.4   Complete by: As directed    Discharge instructions   Complete by: As directed    Thank you for choosing [facility] for your medical care. You were admitted for chest pain. During your stay, we ruled out any heart issues by CT scan and your lab results. We also ruled out a pulmonary embolism via CT. We reached the diagnosis of viral gastritis based on the vomiting, abdominal pain, and diarrhea. We will be giving you a medication to help with nausea, please take your zofran as indicated on the label. If you begin to experience new onset chest pain, fevers, bloody vomitus, bloody stools, or rapid decline in health, please come back for revaluation. Otherwise, please schedule a visit with your primary care physician for follow up.  We will call you with the results of your viral panel.   Increase activity slowly   Complete  by: As directed    Increase activity slowly   Complete by: As directed    Increase activity slowly   Complete by: As directed       Signed: Maudie Mercury, MD 01/08/2019, 10:00 PM   Pager: [phone]

## 2019-02-20 ENCOUNTER — Emergency Department (HOSPITAL_COMMUNITY): Payer: No Typology Code available for payment source

## 2019-02-20 ENCOUNTER — Inpatient Hospital Stay (HOSPITAL_COMMUNITY)
Admission: EM | Admit: 2019-02-20 | Discharge: 2019-03-05 | DRG: 178 | Disposition: A | Discharge status: Home / Self Care | Payer: No Typology Code available for payment source | Attending: Internal Medicine | Admitting: Internal Medicine

## 2019-02-20 ENCOUNTER — Encounter (HOSPITAL_COMMUNITY): Payer: Self-pay | Admitting: *Deleted

## 2019-02-20 ENCOUNTER — Other Ambulatory Visit: Payer: Self-pay

## 2019-02-20 DIAGNOSIS — F1424 Cocaine dependence with cocaine-induced mood disorder: Secondary | ICD-10-CM | POA: Diagnosis present

## 2019-02-20 DIAGNOSIS — F22 Delusional disorders: Secondary | ICD-10-CM | POA: Diagnosis present

## 2019-02-20 DIAGNOSIS — F1510 Other stimulant abuse, uncomplicated: Secondary | ICD-10-CM | POA: Diagnosis present

## 2019-02-20 DIAGNOSIS — R45850 Homicidal ideations: Secondary | ICD-10-CM

## 2019-02-20 DIAGNOSIS — F418 Other specified anxiety disorders: Secondary | ICD-10-CM | POA: Diagnosis present

## 2019-02-20 DIAGNOSIS — F1210 Cannabis abuse, uncomplicated: Secondary | ICD-10-CM | POA: Diagnosis present

## 2019-02-20 DIAGNOSIS — R45851 Suicidal ideations: Secondary | ICD-10-CM

## 2019-02-20 DIAGNOSIS — E785 Hyperlipidemia, unspecified: Secondary | ICD-10-CM | POA: Diagnosis present

## 2019-02-20 DIAGNOSIS — R202 Paresthesia of skin: Secondary | ICD-10-CM | POA: Diagnosis present

## 2019-02-20 DIAGNOSIS — I2510 Atherosclerotic heart disease of native coronary artery without angina pectoris: Secondary | ICD-10-CM | POA: Diagnosis present

## 2019-02-20 DIAGNOSIS — F1414 Cocaine abuse with cocaine-induced mood disorder: Secondary | ICD-10-CM | POA: Diagnosis present

## 2019-02-20 DIAGNOSIS — U071 COVID-19: Secondary | ICD-10-CM | POA: Diagnosis not present

## 2019-02-20 DIAGNOSIS — B1920 Unspecified viral hepatitis C without hepatic coma: Secondary | ICD-10-CM | POA: Diagnosis present

## 2019-02-20 DIAGNOSIS — F1420 Cocaine dependence, uncomplicated: Secondary | ICD-10-CM | POA: Diagnosis present

## 2019-02-20 DIAGNOSIS — R0789 Other chest pain: Secondary | ICD-10-CM | POA: Diagnosis present

## 2019-02-20 DIAGNOSIS — I10 Essential (primary) hypertension: Secondary | ICD-10-CM | POA: Diagnosis present

## 2019-02-20 DIAGNOSIS — R1013 Epigastric pain: Secondary | ICD-10-CM | POA: Diagnosis present

## 2019-02-20 DIAGNOSIS — F4310 Post-traumatic stress disorder, unspecified: Secondary | ICD-10-CM | POA: Diagnosis present

## 2019-02-20 DIAGNOSIS — R748 Abnormal levels of other serum enzymes: Secondary | ICD-10-CM | POA: Diagnosis present

## 2019-02-20 DIAGNOSIS — Z7902 Long term (current) use of antithrombotics/antiplatelets: Secondary | ICD-10-CM

## 2019-02-20 DIAGNOSIS — K219 Gastro-esophageal reflux disease without esophagitis: Secondary | ICD-10-CM | POA: Diagnosis present

## 2019-02-20 DIAGNOSIS — Z8249 Family history of ischemic heart disease and other diseases of the circulatory system: Secondary | ICD-10-CM

## 2019-02-20 DIAGNOSIS — F319 Bipolar disorder, unspecified: Secondary | ICD-10-CM | POA: Diagnosis present

## 2019-02-20 DIAGNOSIS — R079 Chest pain, unspecified: Secondary | ICD-10-CM | POA: Diagnosis present

## 2019-02-20 DIAGNOSIS — J9811 Atelectasis: Secondary | ICD-10-CM | POA: Diagnosis present

## 2019-02-20 DIAGNOSIS — Z888 Allergy status to other drugs, medicaments and biological substances status: Secondary | ICD-10-CM

## 2019-02-20 DIAGNOSIS — R072 Precordial pain: Secondary | ICD-10-CM

## 2019-02-20 DIAGNOSIS — Z7982 Long term (current) use of aspirin: Secondary | ICD-10-CM

## 2019-02-20 DIAGNOSIS — F332 Major depressive disorder, recurrent severe without psychotic features: Secondary | ICD-10-CM | POA: Diagnosis not present

## 2019-02-20 DIAGNOSIS — Z833 Family history of diabetes mellitus: Secondary | ICD-10-CM

## 2019-02-20 DIAGNOSIS — R112 Nausea with vomiting, unspecified: Secondary | ICD-10-CM | POA: Diagnosis present

## 2019-02-20 DIAGNOSIS — Z915 Personal history of self-harm: Secondary | ICD-10-CM

## 2019-02-20 DIAGNOSIS — R109 Unspecified abdominal pain: Secondary | ICD-10-CM | POA: Diagnosis present

## 2019-02-20 DIAGNOSIS — Z79899 Other long term (current) drug therapy: Secondary | ICD-10-CM

## 2019-02-20 DIAGNOSIS — F1910 Other psychoactive substance abuse, uncomplicated: Secondary | ICD-10-CM | POA: Diagnosis present

## 2019-02-20 DIAGNOSIS — F1594 Other stimulant use, unspecified with stimulant-induced mood disorder: Secondary | ICD-10-CM | POA: Diagnosis present

## 2019-02-20 DIAGNOSIS — G8929 Other chronic pain: Secondary | ICD-10-CM | POA: Diagnosis present

## 2019-02-20 DIAGNOSIS — F1010 Alcohol abuse, uncomplicated: Secondary | ICD-10-CM | POA: Diagnosis present

## 2019-02-20 DIAGNOSIS — R1084 Generalized abdominal pain: Secondary | ICD-10-CM

## 2019-02-20 DIAGNOSIS — F17210 Nicotine dependence, cigarettes, uncomplicated: Secondary | ICD-10-CM | POA: Diagnosis present

## 2019-02-20 DIAGNOSIS — F1920 Other psychoactive substance dependence, uncomplicated: Secondary | ICD-10-CM | POA: Diagnosis present

## 2019-02-20 LAB — CBC
HCT: 46.3 % (ref 39.0–52.0)
Hemoglobin: 15.9 g/dL (ref 13.0–17.0)
MCH: 29 pg (ref 26.0–34.0)
MCHC: 34.3 g/dL (ref 30.0–36.0)
MCV: 84.3 fL (ref 80.0–100.0)
Platelets: 288 10*3/uL (ref 150–400)
RBC: 5.49 MIL/uL (ref 4.22–5.81)
RDW: 13.7 % (ref 11.5–15.5)
WBC: 8.3 10*3/uL (ref 4.0–10.5)
nRBC: 0 % (ref 0.0–0.2)

## 2019-02-20 LAB — HEPATIC FUNCTION PANEL
ALT: 274 U/L — ABNORMAL HIGH (ref 0–44)
AST: 80 U/L — ABNORMAL HIGH (ref 15–41)
Albumin: 3.8 g/dL (ref 3.5–5.0)
Alkaline Phosphatase: 54 U/L (ref 38–126)
Bilirubin, Direct: 0.1 mg/dL (ref 0.0–0.2)
Indirect Bilirubin: 0.4 mg/dL (ref 0.3–0.9)
Total Bilirubin: 0.5 mg/dL (ref 0.3–1.2)
Total Protein: 6.9 g/dL (ref 6.5–8.1)

## 2019-02-20 LAB — BASIC METABOLIC PANEL
Anion gap: 10 (ref 5–15)
BUN: <5 mg/dL — ABNORMAL LOW (ref 6–20)
CO2: 23 mmol/L (ref 22–32)
Calcium: 9.3 mg/dL (ref 8.9–10.3)
Chloride: 106 mmol/L (ref 98–111)
Creatinine, Ser: 1 mg/dL (ref 0.61–1.24)
GFR calc Af Amer: >60 mL/min (ref >60)
GFR calc non Af Amer: >60 mL/min (ref >60)
Glucose, Bld: 108 mg/dL — ABNORMAL HIGH (ref 70–99)
Potassium: 3.8 mmol/L (ref 3.5–5.1)
Sodium: 139 mmol/L (ref 135–145)

## 2019-02-20 LAB — TROPONIN I (HIGH SENSITIVITY)
Troponin I (High Sensitivity): 5 ng/L (ref <18)
Troponin I (High Sensitivity): 4 ng/L (ref <18)

## 2019-02-20 LAB — RAPID URINE DRUG SCREEN, HOSP PERFORMED
Amphetamines: POSITIVE — AB
Barbiturates: NOT DETECTED
Benzodiazepines: NOT DETECTED
Cocaine: POSITIVE — AB
Opiates: NOT DETECTED
Tetrahydrocannabinol: POSITIVE — AB

## 2019-02-20 LAB — SARS CORONAVIRUS 2 (TAT 6-24 HRS): SARS Coronavirus 2: POSITIVE — AB

## 2019-02-20 LAB — D-DIMER, QUANTITATIVE: D-Dimer, Quant: 0.35 ug/mL-FEU (ref 0.00–0.50)

## 2019-02-20 LAB — LIPASE, BLOOD: Lipase: 76 U/L — ABNORMAL HIGH (ref 11–51)

## 2019-02-20 MED ORDER — LISINOPRIL 10 MG PO TABS
40.0000 mg | ORAL_TABLET | Freq: Every day | ORAL | Status: DC
  Administered 2019-02-20 – 2019-03-04 (×12): 40 mg via ORAL
  Filled 2019-02-20: qty 4
  Filled 2019-02-20: qty 2
  Filled 2019-02-20 (×2): qty 4
  Filled 2019-02-20 (×2): qty 2
  Filled 2019-02-20 (×4): qty 4
  Filled 2019-02-20: qty 2
  Filled 2019-02-20 (×2): qty 4

## 2019-02-20 MED ORDER — ALUM & MAG HYDROXIDE-SIMETH 200-200-20 MG/5ML PO SUSP
15.0000 mL | Freq: Once | ORAL | Status: AC
  Administered 2019-02-20: 08:00:00 15 mL via ORAL
  Filled 2019-02-20: qty 30

## 2019-02-20 MED ORDER — THIAMINE HCL 100 MG/ML IJ SOLN
100.0000 mg | Freq: Every day | INTRAMUSCULAR | Status: DC
  Administered 2019-02-20 – 2019-02-24 (×2): 100 mg via INTRAVENOUS
  Filled 2019-02-20 (×3): qty 2

## 2019-02-20 MED ORDER — MORPHINE SULFATE (PF) 4 MG/ML IV SOLN
4.0000 mg | Freq: Once | INTRAVENOUS | Status: AC
  Administered 2019-02-20: 4 mg via INTRAVENOUS
  Filled 2019-02-20: qty 1

## 2019-02-20 MED ORDER — SODIUM CHLORIDE 0.9% FLUSH
3.0000 mL | Freq: Once | INTRAVENOUS | Status: AC
  Administered 2019-02-20: 08:00:00 3 mL via INTRAVENOUS

## 2019-02-20 MED ORDER — AMLODIPINE BESYLATE 5 MG PO TABS
10.0000 mg | ORAL_TABLET | Freq: Every day | ORAL | Status: DC
  Administered 2019-02-20 – 2019-03-04 (×12): 10 mg via ORAL
  Filled 2019-02-20 (×14): qty 2

## 2019-02-20 MED ORDER — ASPIRIN EC 81 MG PO TBEC
81.0000 mg | DELAYED_RELEASE_TABLET | Freq: Every day | ORAL | Status: DC
  Administered 2019-02-20 – 2019-03-04 (×12): 81 mg via ORAL
  Filled 2019-02-20 (×15): qty 1

## 2019-02-20 MED ORDER — DICYCLOMINE HCL 20 MG PO TABS
20.0000 mg | ORAL_TABLET | ORAL | Status: DC | PRN
  Administered 2019-02-22 – 2019-03-02 (×4): 20 mg via ORAL
  Filled 2019-02-20 (×5): qty 1

## 2019-02-20 MED ORDER — ATENOLOL 25 MG PO TABS
25.0000 mg | ORAL_TABLET | Freq: Every day | ORAL | Status: DC
  Administered 2019-02-20 – 2019-02-22 (×3): 25 mg via ORAL
  Filled 2019-02-20 (×5): qty 1

## 2019-02-20 MED ORDER — ONDANSETRON HCL 4 MG/2ML IJ SOLN
4.0000 mg | Freq: Once | INTRAMUSCULAR | Status: AC
  Administered 2019-02-20: 08:00:00 4 mg via INTRAVENOUS
  Filled 2019-02-20: qty 2

## 2019-02-20 MED ORDER — GABAPENTIN 300 MG PO CAPS
800.0000 mg | ORAL_CAPSULE | Freq: Three times a day (TID) | ORAL | Status: DC
  Administered 2019-02-20 – 2019-02-24 (×13): 800 mg via ORAL
  Filled 2019-02-20 (×14): qty 2

## 2019-02-20 MED ORDER — IOHEXOL 300 MG/ML  SOLN
100.0000 mL | Freq: Once | INTRAMUSCULAR | Status: AC | PRN
  Administered 2019-02-20: 100 mL via INTRAVENOUS

## 2019-02-20 MED ORDER — VITAMIN B-1 100 MG PO TABS
100.0000 mg | ORAL_TABLET | Freq: Every day | ORAL | Status: DC
  Administered 2019-02-21 – 2019-03-04 (×10): 100 mg via ORAL
  Filled 2019-02-20 (×13): qty 1

## 2019-02-20 MED ORDER — PRAVASTATIN SODIUM 10 MG PO TABS
20.0000 mg | ORAL_TABLET | Freq: Every day | ORAL | Status: DC
  Administered 2019-02-20 – 2019-03-04 (×12): 20 mg via ORAL
  Filled 2019-02-20 (×15): qty 2

## 2019-02-20 MED ORDER — CLOPIDOGREL BISULFATE 75 MG PO TABS
75.0000 mg | ORAL_TABLET | Freq: Every day | ORAL | Status: DC
  Administered 2019-02-20 – 2019-03-04 (×12): 75 mg via ORAL
  Filled 2019-02-20 (×14): qty 1

## 2019-02-20 MED ORDER — ACETAMINOPHEN 325 MG PO TABS
650.0000 mg | ORAL_TABLET | ORAL | Status: DC | PRN
  Administered 2019-02-21 – 2019-02-22 (×2): 650 mg via ORAL
  Filled 2019-02-20 (×2): qty 2

## 2019-02-20 MED ORDER — PANTOPRAZOLE SODIUM 40 MG PO TBEC
40.0000 mg | DELAYED_RELEASE_TABLET | Freq: Every day | ORAL | Status: DC
  Administered 2019-02-20 – 2019-03-04 (×12): 40 mg via ORAL
  Filled 2019-02-20 (×14): qty 1

## 2019-02-20 MED ORDER — MELATONIN 3 MG PO TABS
6.0000 mg | ORAL_TABLET | Freq: Every evening | ORAL | Status: DC | PRN
  Filled 2019-02-20: qty 2

## 2019-02-20 NOTE — ED Notes (Addendum)
Patient was ask if he had SI/HI thoughts upon arrival  and he stated no, after waiting in the lobby now decides he is SI

## 2019-02-20 NOTE — ED Notes (Signed)
Pt belongings inventoried by this NT and Tawanna Solo, Therapist, sports. Valuables envelope given to security. Pt belongings placed in purple zone locker #[id number].

## 2019-02-20 NOTE — ED Provider Notes (Signed)
Emergency Department Provider Note   I have reviewed the triage vital signs and the nursing notes.   HISTORY  Chief Complaint Chest Pain, Suicidal, and Homicidal   HPI [name] is a 58 y.o. male with a past medical history who reviewed below presents to the emergency department with multiple complaints including chest pain, abdominal pain, suicidal ideation.  Patient states that he moved from [place] [facility] but has been unsuccessful.  He has been off of all medications for the last month.  2 to 3 days ago he developed chest discomfort which feels unlike his prior ACS type pain.  He has some right-sided abdominal discomfort as well including tingling in the right leg.  Denies numbness or weakness.  No upper extremity symptoms.  No speech or vision disturbance.  No fevers or chills.  Patient describes the chest pain as a burning type discomfort.  No radiation of symptoms or other modifying factors.   Past Medical History:  Diagnosis Date   Alcohol abuse    Anxiety    Arthritis    "all over" (11/25/2015)   Bipolar 1 disorder (HCC)    Bipolar affective disorder (HCC)    Chronic back pain    Chronic kidney disease    Cocaine abuse (HCC)    smokes crack-cocaine   Depression    GERD (gastroesophageal reflux disease)    Gout    Headache    "q couple days; stress, tension, anger" (11/25/2015)   Hemorrhoids    Hepatitis C    "not yet treated" (11/25/2015)   Hypertension    Insomnia    Myocardial infarction (HCC) 11/2015   "supposedly" (11/25/2015)   OSA on CPAP    "had mask; it was stolen" (11/25/2015)   PTSD (post-traumatic stress disorder)    Tobacco abuse    Vitamin D deficiency     Patient Active Problem List   Diagnosis Date Noted   Abdominal pain, epigastric 12/25/2018   Acute encephalopathy 05/25/2016   Cocaine abuse with cocaine-induced mood disorder (HCC) 03/20/2016   Abnormal  nuclear stress test    Palpitations 11/26/2015   Troponin level elevated 11/26/2015   Homeless 11/26/2015   Essential hypertension 11/26/2015   SVT (supraventricular tachycardia) (HCC)    Pain in the chest 11/25/2015   GERD (gastroesophageal reflux disease)    Bipolar 1 disorder (HCC)    Back pain, chronic    Chronic kidney disease    Affective psychosis, bipolar (HCC)    Alcohol use disorder, severe, dependence (HCC)    Cocaine use disorder, severe, dependence (HCC)    Generalized abdominal pain    Bipolar I disorder, most recent episode depressed (HCC)    Bipolar disorder, current episode depressed, severe, without psychotic features (HCC)    Bipolar affective disorder, depressed, severe (HCC) 03/26/2014   Right hip pain    Medically noncompliant 03/23/2014   Dysuria 03/22/2013   Headache 03/22/2013   Polysubstance dependence (HCC) 03/21/2012   Chest pain 02/23/2012   Anxiety    Bipolar affective disorder (HCC)    Depression    Polysubstance abuse (HCC)    Gout    Vitamin D deficiency    Hepatitis C     Past Surgical History:  Procedure Laterality Date   CARDIAC CATHETERIZATION N/A 11/29/2015   Procedure: Left Heart Cath and Coronary Angiography;  Surgeon: Laurey Moralealton S McLean, MD;  [facility];  Service: Cardiovascular;  Laterality: N/A;  FRACTURE SURGERY     PATELLA FRACTURE SURGERY Left ~ 1995   TONSILLECTOMY      Allergies Lactulose and Lactose intolerance (gi)  Family History  Problem Relation Age of Onset   Hypertension Brother    Diabetes Brother    Hypertension Mother    Diabetes Mother    Arthritis Mother    Other Sister        bone disease    Social History Social History   Tobacco Use   Smoking status: Current Every Day Smoker    Packs/day: 0.12    Years: 46.00    Pack years: 5.52   Smokeless tobacco: Never Used  Substance Use Topics   Alcohol use: Yes    Comment: 11/25/2015 "quit  alcohol 6 months ago; used to drink from morning til night"   Drug use: Yes    Types: Marijuana, "Crack" cocaine, Cocaine    Comment: states that he uses marijuana and cocaine every now and then     Review of Systems  Constitutional: No fever/chills Eyes: No visual changes. ENT: No sore throat. Cardiovascular: Positive chest pain. Respiratory: Denies shortness of breath. Gastrointestinal: Positive abdominal pain.  No nausea, no vomiting.  No diarrhea.  No constipation. Genitourinary: Negative for dysuria. Musculoskeletal: Negative for back pain. Skin: Negative for rash. Neurological: Negative for headaches, focal weakness or numbness. Psych: Positive SI/HI  10-point ROS otherwise negative.  ____________________________________________   PHYSICAL EXAM:  VITAL SIGNS: Vitals:   02/20/19 0930 02/20/19 1130  BP: (!) 144/85 (!) 136/92  Pulse: (!) 49 65  Resp: 18 (!) 24  SpO2: 95% 94%   Constitutional: Alert and oriented. Well appearing and in no acute distress. Eyes: Conjunctivae are normal.  Head: Atraumatic. Nose: No congestion/rhinnorhea. Mouth/Throat: Mucous membranes are moist. Neck: No stridor.   Cardiovascular: Normal rate, regular rhythm. Good peripheral circulation. Grossly normal heart sounds.   Respiratory: Normal respiratory effort.  No retractions. Lungs CTAB. Gastrointestinal: Soft with mild right sided tenderness. No rebound or guarding. No distention.  Musculoskeletal: No lower extremity tenderness nor edema. No gross deformities of extremities. Neurologic:  Normal speech and language. No gross focal neurologic deficits are appreciated.  Skin:  Skin is warm, dry and intact. No rash noted. Psychiatric: Mood and affect are normal. Speech and behavior are normal.  ____________________________________________   LABS (all labs ordered are listed, but only abnormal results are displayed)  Labs Reviewed  BASIC METABOLIC PANEL - Abnormal; Notable for the  following components:      Result Value   Glucose, Bld 108 (*)    BUN <5 (*)    All other components within normal limits  HEPATIC FUNCTION PANEL - Abnormal; Notable for the following components:   AST 80 (*)    ALT 274 (*)    All other components within normal limits  LIPASE, BLOOD - Abnormal; Notable for the following components:   Lipase 76 (*)    All other components within normal limits  RAPID URINE DRUG SCREEN, HOSP PERFORMED - Abnormal; Notable for the following components:   Cocaine POSITIVE (*)    Amphetamines POSITIVE (*)    Tetrahydrocannabinol POSITIVE (*)    All other components within normal limits  SARS CORONAVIRUS 2 (TAT 6-24 HRS)  CBC  D-DIMER, QUANTITATIVE (NOT AT [facility])  TROPONIN I (HIGH SENSITIVITY)  TROPONIN I (HIGH SENSITIVITY)   ____________________________________________  EKG   EKG Interpretation  Date/Time:  Thursday February 20 2019 04:27:36 EST Ventricular Rate:  83 PR Interval:  174  QRS Duration: 76 QT Interval:  378 QTC Calculation: 444 R Axis:   19 Text Interpretation: Sinus rhythm with Premature atrial complexes Otherwise normal ECG No significant change since last tracing Confirmed by Pryor Curia [phone]) on 02/20/2019 4:37:29 AM       ____________________________________________  RADIOLOGY  Dg Chest 2 View  Result Date: 02/20/2019 CLINICAL DATA:  Chest pain. EXAM: CHEST - 2 VIEW COMPARISON:  CT 12/24/2018.  Chest x-ray 12/24/2018. FINDINGS: Mediastinum hilar structures normal. Heart size normal. Coronary artery calcification noted. Low lung volumes with mild bibasilar atelectasis again noted. No pleural effusion or pneumothorax. No acute bony abnormality identified. IMPRESSION: 1. Coronary artery calcification noted consistent coronary artery disease. Heart size normal. 2. Low lung volumes with mild bibasilar atelectasis again noted. No interim change. Chest is stable from prior exam. Electronically Signed   By: Marcello Moores  Register   On:  02/20/2019 06:07   Ct Abdomen Pelvis W Contrast  Result Date: 02/20/2019 CLINICAL DATA:  Right lower quadrant pain and diarrhea since yesterday morning. EXAM: CT ABDOMEN AND PELVIS WITH CONTRAST TECHNIQUE: Multidetector CT imaging of the abdomen and pelvis was performed using the standard protocol following bolus administration of intravenous contrast. CONTRAST:  165mL OMNIPAQUE IOHEXOL 300 MG/ML  SOLN COMPARISON:  12/24/2018 FINDINGS: Lower chest: No acute abnormality. Hepatobiliary: No focal liver abnormality is seen. No gallstones, gallbladder wall thickening, or biliary dilatation. Pancreas: Unremarkable. No pancreatic ductal dilatation or surrounding inflammatory changes. Spleen: Normal in size without focal abnormality. Adrenals/Urinary Tract: No adrenal masses. Kidneys are normal in size, orientation and position with symmetric enhancement and excretion. 17 mm cyst, midpole the right kidney, stable. No other renal masses, no stones and no hydronephrosis. Normal ureters. Normal bladder. Stomach/Bowel: Stomach is unremarkable. Small bowel and colon are normal in caliber. No wall thickening. No inflammation. There are several small left colon diverticula without diverticulitis. Normal appendix visualized. Vascular/Lymphatic: Mild aortic atherosclerosis. No aneurysm or other vascular abnormality. No enlarged lymph nodes. Reproductive: Unremarkable. Other: No abdominal wall hernia or abnormality. No abdominopelvic ascites. Musculoskeletal: No fracture or acute finding. No osteoblastic or osteolytic lesions. IMPRESSION: 1. No acute findings. No findings to account for the patient's pain. No bowel inflammation. Normal appendix visualized. 2. Mild aortic atherosclerosis. Electronically Signed   By: Lajean Manes M.D.   On: 02/20/2019 10:18    ____________________________________________   PROCEDURES  Procedure(s) performed:   Procedures  None   ____________________________________________   INITIAL IMPRESSION / ASSESSMENT AND PLAN / ED COURSE  Pertinent labs & imaging results that were available during my care of the patient were reviewed by me and considered in my medical decision making (see chart for details).   Patient presents to the emergency department for evaluation of of chest pain, abdominal pain, suicidal thoughts.  He has been off all medications for the last month.  He does have history of CAD.  Denies drinking or drug use recently.  He is having active suicidal and reports homicidal thoughts without a clear plan.  Patient screening EKG and initial blood work are reassuring with troponin of 5.  EKG interpreted by myself and similar to prior.  Chest x-ray with no clear findings.  Plan for pain/nausea medication.  Will send COVID-19 testing although suspicion for this diagnosis is very low.  Repeat troponin. I have added LFTs, Lipase, and D-dimer to triage labs. Patient will need TTS evaluation when medically clear.   11:15 AM  Patient's abdomen labs resulted AST ALT elevated slightly but have been in the  past.  Mild lipase elevation but no active vomiting here.  Patient's urine tox is positive for cocaine, amphetamine, marijuana.  Starting the patient on CIWA protocol but no history of complicated withdrawal.  Troponin trended and low/negative x2.  CT abdomen pelvis reviewed with no acute findings.  At this time, patient is medically clear and ready for TTS evaluation. PRN meds and home meds ordered.   ____________________________________________  FINAL CLINICAL IMPRESSION(S) / ED DIAGNOSES  Final diagnoses:  Precordial chest pain  Generalized abdominal pain  Suicidal ideation  Homicidal ideation     MEDICATIONS GIVEN DURING THIS VISIT:  Medications  thiamine (VITAMIN B-1) tablet 100 mg ( Oral See Alternative 02/20/19 1133)    Or  thiamine (B-1) injection 100 mg (100 mg Intravenous Given 02/20/19 1133)   acetaminophen (TYLENOL) tablet 650 mg (has no administration in time range)  amLODipine (NORVASC) tablet 10 mg (has no administration in time range)  aspirin EC tablet 81 mg (has no administration in time range)  atenolol (TENORMIN) tablet 25 mg (has no administration in time range)  clopidogrel (PLAVIX) tablet 75 mg (has no administration in time range)  dicyclomine (BENTYL) tablet 20 mg (has no administration in time range)  lisinopril (ZESTRIL) tablet 40 mg (has no administration in time range)  Melatonin TABS 6 mg (has no administration in time range)  pantoprazole (PROTONIX) EC tablet 40 mg (has no administration in time range)  pravastatin (PRAVACHOL) tablet 20 mg (has no administration in time range)  gabapentin (NEURONTIN) capsule 800 mg (has no administration in time range)  sodium chloride flush (NS) 0.9 % injection 3 mL (3 mLs Intravenous Given 02/20/19 0824)  morphine 4 MG/ML injection 4 mg (4 mg Intravenous Given 02/20/19 0823)  alum & mag hydroxide-simeth (MAALOX/MYLANTA) 200-200-20 MG/5ML suspension 15 mL (15 mLs Oral Given 02/20/19 0823)  ondansetron (ZOFRAN) injection 4 mg (4 mg Intravenous Given 02/20/19 0823)  iohexol (OMNIPAQUE) 300 MG/ML solution 100 mL (100 mLs Intravenous Contrast Given 02/20/19 1001)    Note:  This document was prepared using Dragon voice recognition software and may include unintentional dictation errors.  Alona Bene, MD, [facility]    Sangita Zani, Arlyss Repress, MD 02/20/19 [phone]

## 2019-02-20 NOTE — BH Assessment (Addendum)
Per Mordecai Maes, NP pt meets inpatient criteria. TTS will look for appropriate placement. Patient referred to the following hospitals (pending review):  [facility] due to positive test.  [facility]  [facility] Details   [facility]  [facility]  [facility]  [facility] Details   [facility]

## 2019-02-20 NOTE — ED Notes (Signed)
-  PT says he is here for central chest pain with lower abdominal cramps -He adds that he is having thought of hurting himself and others -PT states he has a plan but refused to state the plan; stating "Like I told them at the [place], I'm not telling no one."  PT's belongings with security and locker 12

## 2019-02-20 NOTE — BH Assessment (Signed)
Declined by [facility] due to positive COVID test, per Janett Billow.

## 2019-02-20 NOTE — BHH Counselor (Signed)
Notified [facility] staff that patient is unable to be placed at an outside facility due to positive COVID results.

## 2019-02-20 NOTE — BHH Counselor (Signed)
  [place] ASSESSMENT DISPOSITION  [facility] discussed case with [place] Provider, Mordecai Maes, NP who recommends inpatient treatment. TTS will look for appropriate placement.  Jaziah Kwasnik L. [place], [place], [facility], [facility] Therapeutic Triage Specialist  [phone]

## 2019-02-20 NOTE — ED Notes (Signed)
Patient transported to CT 

## 2019-02-20 NOTE — ED Notes (Signed)
Pt asked tech to speak with the nurse that he had spoken to about his SI thoughts, Tech informed RN Claiborne Billings

## 2019-02-20 NOTE — ED Notes (Signed)
Regular dinner tray ordered 

## 2019-02-20 NOTE — ED Notes (Signed)
Pt witnessed changing into hospital scrubs by this NT. Pt wanded by security.

## 2019-02-20 NOTE — ED Notes (Signed)
Behavioral health called, they cannot place pt due to him being COVID positive.

## 2019-02-20 NOTE — BH Assessment (Signed)
Tele Assessment Note   Patient Name: [name] MRN: [id number] Referring Physician: Dr. Arlyss Repress. Long, MD Location of Patient: [facility] Location of Provider: Behavioral Health TTS Department  [name] is a 58 y.o. male who voluntarily came to [facility] with SI/HI with a plan.  Patient states, "I can't stop crying and thinking about people who have hurt me.  I'm thinking about taking my gun killing him and then shooting myself."  Pt refuse to give the name of the individual.  Pt reports having a history of suicide attempts.  Pt states, "I tried to kill myself when my daughter died."  Pt admits to relapsing 4 days ago after 2 years of sobriety.  Pt states, "I had alcohol and cocaine 2 days ago" but did not disclosure the amount.  Pt reports having running thoughts and minimal sleep.  Pt denies A/v-hallucinations.   Pt reside alone.  Pt receives disabilities and does not currently work.  Pt reports that recently transferred from [facility] and currently receives medication management from [facility].  Pt has not started receiving treatment at [facility] yet.  Pt reports having multiple inpatient MH/SA treatment.  Pt denies having a history of physical, sexual and verbal abuse.   Patient was wearing a hospital gown and appeared appropriately groomed.  Pt was alert throughout the assessment.  Patient made poor eye contact and had abnormal psychomotor activity.  Patient spoke in a normal voice with pressured speech.  Pt expressed feeling mad and depressed.  Pt's affect appeared dysphoric and congruent with stated mood. Pt's thought process was coherent and logical.  Pt presented with partial insight and judgement.  Pt did not appear to be responding to internal stimuli.  Pt was not able to contract for his safety or the safety of others.  Disposition: [facility] discussed case with BH Provider, [name] Magnuson, NP who recommends inpatient treatment. TTS  will look for appropriate placement.  Diagnosis: F33.2  Major Depressive Disorder,Severe   Past Medical History:  Past Medical History:  Diagnosis Date  . Alcohol abuse   . Anxiety   . Arthritis    "all over" (11/25/2015)  . Bipolar 1 disorder (HCC)   . Bipolar affective disorder (HCC)   . Chronic back pain   . Chronic kidney disease   . Cocaine abuse (HCC)    smokes crack-cocaine  . Depression   . GERD (gastroesophageal reflux disease)   . Gout   . Headache    "q couple days; stress, tension, anger" (11/25/2015)  . Hemorrhoids   . Hepatitis C    "not yet treated" (11/25/2015)  . Hypertension   . Insomnia   . Myocardial infarction [facility]) 11/2015   "supposedly" (11/25/2015)  . OSA on CPAP    "had mask; it was stolen" (11/25/2015)  . PTSD (post-traumatic stress disorder)   . Tobacco abuse   . Vitamin D deficiency     Past Surgical History:  Procedure Laterality Date  . CARDIAC CATHETERIZATION N/A 11/29/2015   Procedure: Left Heart Cath and Coronary Angiography;  Surgeon: Laurey Morale, MD;  Location: [facility] INVASIVE CV LAB;  Service: Cardiovascular;  Laterality: N/A;  . FRACTURE SURGERY    . PATELLA FRACTURE SURGERY Left ~ 1995  . TONSILLECTOMY      Family History:  Family History  Problem Relation Age of Onset  . Hypertension Brother   . Diabetes Brother   . Hypertension Mother   . Diabetes Mother   .  Arthritis Mother   . Other Sister        bone disease    Social History:  reports that he has been smoking. He has a 5.52 pack-year smoking history. He has never used smokeless tobacco. He reports current alcohol use. He reports current drug use. Drugs: Marijuana, "Crack" cocaine, and Cocaine.  Additional Social History:  Alcohol / Drug Use Pain Medications: See MARs Prescriptions: See MARs Over the Counter: See MARs History of alcohol / drug use?: Yes Longest period of sobriety (when/how long): 2 years clean Negative Consequences of Use: Personal relationships,  Financial Withdrawal Symptoms: Agitation, Aggressive/Assaultive, Irritability Substance #1 Name of Substance 1: Alcohol 1 - Age of First Use: unknown 1 - Amount (size/oz): unknown 1 - Frequency: unknown 1 - Duration: ongoing 1 - Last Use / Amount: 2 days ago Substance #2 Name of Substance 2: Cocaine 2 - Age of First Use: unknown 2 - Amount (size/oz): unknown 2 - Frequency: unknown 2 - Duration: ongoing 2 - Last Use / Amount: 2 days ago  CIWA: CIWA-Ar BP: (!) 136/92 Pulse Rate: 65 COWS:    Allergies:  Allergies  Allergen Reactions  . Lactulose Hives and Nausea Only  . Lactose Intolerance (Gi) Hives and Nausea Only    Home Medications: (Not in a hospital admission)   OB/GYN Status:  No LMP for male patient.  General Assessment Data Location of Assessment: [facility] ED TTS Assessment: In system Is this a Tele or Face-to-Face Assessment?: Tele Assessment Is this an Initial Assessment or a Re-assessment for this encounter?: Initial Assessment Patient Accompanied by:: N/A Language Other than English: No Living Arrangements: Other (Comment)(pt lives alone) What gender do you identify as?: Male Marital status: Single Living Arrangements: Alone Can pt return to current living arrangement?: Yes Admission Status: Voluntary Is patient capable of signing voluntary admission?: Yes Referral Source: Self/Family/Friend     Crisis Care Plan Living Arrangements: Alone [facility] Psychiat[name])  Education Status Is patient currently in school?: No Is the patient employed, unemployed or receiving disability?: Receiving disability income  Risk to self with the past 6 months Suicidal Ideation: Yes-Currently Present Has patient been a risk to self within the past 6 months prior to admission? : Yes Suicidal Intent: Yes-Currently Present Has patient had any suicidal intent within the past 6 months prior to admission? : Yes Is  patient at risk for suicide?: Yes Suicidal Plan?: Yes-Currently Present Has patient had any suicidal plan within the past 6 months prior to admission? : Yes Specify Current Suicidal Plan: Shoot himself in the head Access to Means: Yes Specify Access to Suicidal Means: gun  What has been your use of drugs/alcohol within the last 12 months?: Alcohol and Cocaine Previous Attempts/Gestures: Yes How many times?: 1 Triggers for Past Attempts: Family contact, Other (Comment)(Daughter died) Intentional Self Injurious Behavior: Cutting Comment - Self Injurious Behavior: jumped in front of a car Family Suicide History: Unknown Recent stressful life event(s): Conflict (Comment) Persecutory voices/beliefs?: Yes Depression: Yes Depression Symptoms: Tearfulness, Isolating, Guilt, Loss of interest in usual pleasures, Feeling worthless/self pity Substance abuse history and/or treatment for substance abuse?: Yes Suicide prevention information given to non-admitted patients: Not applicable  Risk to Others within the past 6 months Homicidal Ideation: Yes-Currently Present Does patient have any lifetime risk of violence toward others beyond the six months prior to admission? : Yes (comment) Thoughts of Harm to Others: Yes-Currently Present Comment - Thoughts of Harm to Others: people who  mistreated me Current Homicidal Intent: Yes-Currently Present Current Homicidal Plan: Yes-Currently Present Describe Current Homicidal Plan: Murder suicide Access to Homicidal Means: Yes Describe Access to Homicidal Means: Pt report owning a gun Identified Victim: Pt would not say History of harm to others?: Yes Assessment of Violence: In past 6-12 months Does patient have access to weapons?: Yes (Comment) Criminal Charges Pending?: No Does patient have a court date: No Is patient on probation?: No  Psychosis Hallucinations: Auditory Delusions: None noted  Mental Status Report Appearance/Hygiene: In hospital  gown Eye Contact: Poor Motor Activity: Agitation Speech: Aggressive, Logical/coherent Level of Consciousness: Quiet/awake, Restless Mood: Anxious, Suspicious Affect: Angry, Anxious, Apprehensive Anxiety Level: Moderate Thought Processes: Coherent, Relevant Judgement: Partial Orientation: Person, Place, Appropriate for developmental age Obsessive Compulsive Thoughts/Behaviors: Minimal  Cognitive Functioning Concentration: Normal Memory: Recent Intact, Remote Intact Is patient IDD: No Insight: Poor Impulse Control: Poor Appetite: Poor Have you had any weight changes? : No Change Sleep: Decreased Total Hours of Sleep: 3 Vegetative Symptoms: None  ADLScreening [facility] Assessment Services) Patient's cognitive ability adequate to safely complete daily activities?: Yes Patient able to express need for assistance with ADLs?: Yes Independently performs ADLs?: Yes (appropriate for developmental age)  Prior Inpatient Therapy Prior Inpatient Therapy: Yes Prior Therapy Dates: multiple times Prior Therapy Facilty/Provider(s): multiple  Reason for Treatment: MH/SA  Prior Outpatient Therapy Prior Outpatient Therapy: Yes Prior Therapy Dates: ongoing Prior Therapy Facilty/Provider(s): [facility] Reason for Treatment: MH/SA Does patient have an ACCT team?: No Does patient have Intensive In-House Services?  : No Does patient have [facility] services? : No Does patient have P4CC services?: No  ADL Screening (condition at time of admission) Patient's cognitive ability adequate to safely complete daily activities?: Yes Is the patient deaf or have difficulty hearing?: No Does the patient have difficulty seeing, even when wearing glasses/contacts?: Yes Does the patient have difficulty concentrating, remembering, or making decisions?: No Patient able to express need for assistance with ADLs?: Yes Does the patient have difficulty dressing or bathing?: No Independently performs ADLs?: Yes  (appropriate for developmental age) Does the patient have difficulty walking or climbing stairs?: No Weakness of Legs: None Weakness of Arms/Hands: None  Home Assistive Devices/Equipment Home Assistive Devices/Equipment: None    Abuse/Neglect Assessment (Assessment to be complete while patient is alone) Abuse/Neglect Assessment Can Be Completed: Yes Physical Abuse: Denies Verbal Abuse: Denies Sexual Abuse: Denies Exploitation of patient/patient's resources: Denies Self-Neglect: Denies     Merchant navy officer (For Healthcare) Does Patient Have a Medical Advance Directive?: Yes Nutrition Screen- [facility] Adult/WL/AP Patient's home diet: NPO        Disposition: [facility] discussed case with BH Provider, [name] Magnuson, NP who recommends inpatient treatment. TTS will look for appropriate placement.  Disposition Initial Assessment Completed for this Encounter: Yes Disposition of Patient: Admit(Per [name] Magnuson, NP) Type of inpatient treatment program: Adult Patient refused recommended treatment: No  This service was provided via telemedicine using a 2-way, interactive audio and video technology.  Names of all persons participating in this telemedicine service and their role in this encounter. Name: [name] Role: Patient  Name: Tyron Russell, MS, [facility], NCC Role: Triage Specialist  Name: [name] Magnuson, NP Role: [facility] Provider  Name:  Role:     Tyron Russell, MS, [facility], NCC 02/20/2019 12:01 PM

## 2019-02-20 NOTE — ED Triage Notes (Signed)
C/o chest pain worse with inspiration and movement on set this am. C/o abd. And tingling all over.

## 2019-02-21 NOTE — ED Notes (Signed)
Pt had bath given by self.

## 2019-02-21 NOTE — ED Notes (Signed)
Breakfast Ordered 

## 2019-02-21 NOTE — ED Notes (Signed)
Spoke with ED doctor regarding possibly admitting patient to the hospital.

## 2019-02-21 NOTE — ED Notes (Signed)
COVID+/inpt

## 2019-02-21 NOTE — ED Notes (Signed)
Manager on duty Bertram Millard [phone] has his property wants to know how to get it to him

## 2019-02-21 NOTE — ED Notes (Signed)
Regular dinner tray ordered 

## 2019-02-21 NOTE — ED Notes (Signed)
Regular lunch tray ordered 

## 2019-02-21 NOTE — ED Notes (Signed)
Pt states that he still has SI thoughts, plans to shoot himself, states he wants to hurt other but will not disclose who, denies AVH

## 2019-02-22 DIAGNOSIS — R45851 Suicidal ideations: Secondary | ICD-10-CM

## 2019-02-22 DIAGNOSIS — F1010 Alcohol abuse, uncomplicated: Secondary | ICD-10-CM | POA: Diagnosis present

## 2019-02-22 DIAGNOSIS — E785 Hyperlipidemia, unspecified: Secondary | ICD-10-CM | POA: Diagnosis present

## 2019-02-22 DIAGNOSIS — F22 Delusional disorders: Secondary | ICD-10-CM | POA: Diagnosis present

## 2019-02-22 DIAGNOSIS — F1594 Other stimulant use, unspecified with stimulant-induced mood disorder: Secondary | ICD-10-CM | POA: Diagnosis not present

## 2019-02-22 DIAGNOSIS — F17210 Nicotine dependence, cigarettes, uncomplicated: Secondary | ICD-10-CM | POA: Diagnosis present

## 2019-02-22 DIAGNOSIS — R072 Precordial pain: Secondary | ICD-10-CM | POA: Diagnosis not present

## 2019-02-22 DIAGNOSIS — K219 Gastro-esophageal reflux disease without esophagitis: Secondary | ICD-10-CM | POA: Diagnosis present

## 2019-02-22 DIAGNOSIS — F1910 Other psychoactive substance abuse, uncomplicated: Secondary | ICD-10-CM

## 2019-02-22 DIAGNOSIS — B1920 Unspecified viral hepatitis C without hepatic coma: Secondary | ICD-10-CM

## 2019-02-22 DIAGNOSIS — F1210 Cannabis abuse, uncomplicated: Secondary | ICD-10-CM | POA: Diagnosis present

## 2019-02-22 DIAGNOSIS — I2510 Atherosclerotic heart disease of native coronary artery without angina pectoris: Secondary | ICD-10-CM | POA: Diagnosis present

## 2019-02-22 DIAGNOSIS — R1013 Epigastric pain: Secondary | ICD-10-CM | POA: Diagnosis present

## 2019-02-22 DIAGNOSIS — U071 COVID-19: Secondary | ICD-10-CM | POA: Diagnosis present

## 2019-02-22 DIAGNOSIS — R45850 Homicidal ideations: Secondary | ICD-10-CM

## 2019-02-22 DIAGNOSIS — R109 Unspecified abdominal pain: Secondary | ICD-10-CM

## 2019-02-22 DIAGNOSIS — I10 Essential (primary) hypertension: Secondary | ICD-10-CM | POA: Diagnosis present

## 2019-02-22 DIAGNOSIS — J9811 Atelectasis: Secondary | ICD-10-CM | POA: Diagnosis present

## 2019-02-22 DIAGNOSIS — F1420 Cocaine dependence, uncomplicated: Secondary | ICD-10-CM | POA: Diagnosis not present

## 2019-02-22 DIAGNOSIS — F418 Other specified anxiety disorders: Secondary | ICD-10-CM | POA: Diagnosis present

## 2019-02-22 DIAGNOSIS — F4310 Post-traumatic stress disorder, unspecified: Secondary | ICD-10-CM | POA: Diagnosis present

## 2019-02-22 DIAGNOSIS — R748 Abnormal levels of other serum enzymes: Secondary | ICD-10-CM | POA: Diagnosis present

## 2019-02-22 DIAGNOSIS — Z915 Personal history of self-harm: Secondary | ICD-10-CM | POA: Diagnosis not present

## 2019-02-22 DIAGNOSIS — F3173 Bipolar disorder, in partial remission, most recent episode manic: Secondary | ICD-10-CM | POA: Diagnosis not present

## 2019-02-22 DIAGNOSIS — R202 Paresthesia of skin: Secondary | ICD-10-CM | POA: Diagnosis present

## 2019-02-22 DIAGNOSIS — F1510 Other stimulant abuse, uncomplicated: Secondary | ICD-10-CM | POA: Diagnosis present

## 2019-02-22 DIAGNOSIS — R112 Nausea with vomiting, unspecified: Secondary | ICD-10-CM | POA: Diagnosis present

## 2019-02-22 DIAGNOSIS — F1424 Cocaine dependence with cocaine-induced mood disorder: Secondary | ICD-10-CM | POA: Diagnosis present

## 2019-02-22 DIAGNOSIS — G8929 Other chronic pain: Secondary | ICD-10-CM | POA: Diagnosis present

## 2019-02-22 DIAGNOSIS — F1414 Cocaine abuse with cocaine-induced mood disorder: Secondary | ICD-10-CM | POA: Diagnosis not present

## 2019-02-22 DIAGNOSIS — F332 Major depressive disorder, recurrent severe without psychotic features: Secondary | ICD-10-CM | POA: Diagnosis not present

## 2019-02-22 DIAGNOSIS — R079 Chest pain, unspecified: Secondary | ICD-10-CM

## 2019-02-22 DIAGNOSIS — R0789 Other chest pain: Secondary | ICD-10-CM | POA: Diagnosis present

## 2019-02-22 LAB — CBC WITH DIFFERENTIAL/PLATELET
Abs Immature Granulocytes: 0.02 10*3/uL (ref 0.00–0.07)
Basophils Absolute: 0 10*3/uL (ref 0.0–0.1)
Basophils Relative: 0 %
Eosinophils Absolute: 0.3 10*3/uL (ref 0.0–0.5)
Eosinophils Relative: 4 %
HCT: 44 % (ref 39.0–52.0)
Hemoglobin: 15 g/dL (ref 13.0–17.0)
Immature Granulocytes: 0 %
Lymphocytes Relative: 24 %
Lymphs Abs: 1.7 10*3/uL (ref 0.7–4.0)
MCH: 28.8 pg (ref 26.0–34.0)
MCHC: 34.1 g/dL (ref 30.0–36.0)
MCV: 84.5 fL (ref 80.0–100.0)
Monocytes Absolute: 0.5 10*3/uL (ref 0.1–1.0)
Monocytes Relative: 7 %
Neutro Abs: 4.7 10*3/uL (ref 1.7–7.7)
Neutrophils Relative %: 65 %
Platelets: 243 10*3/uL (ref 150–400)
RBC: 5.21 MIL/uL (ref 4.22–5.81)
RDW: 13.6 % (ref 11.5–15.5)
WBC: 7.3 10*3/uL (ref 4.0–10.5)
nRBC: 0 % (ref 0.0–0.2)

## 2019-02-22 LAB — COMPREHENSIVE METABOLIC PANEL
ALT: 172 U/L — ABNORMAL HIGH (ref 0–44)
AST: 44 U/L — ABNORMAL HIGH (ref 15–41)
Albumin: 3.5 g/dL (ref 3.5–5.0)
Alkaline Phosphatase: 51 U/L (ref 38–126)
Anion gap: 12 (ref 5–15)
BUN: 6 mg/dL (ref 6–20)
CO2: 22 mmol/L (ref 22–32)
Calcium: 8.9 mg/dL (ref 8.9–10.3)
Chloride: 105 mmol/L (ref 98–111)
Creatinine, Ser: 0.89 mg/dL (ref 0.61–1.24)
GFR calc Af Amer: >60 mL/min (ref >60)
GFR calc non Af Amer: >60 mL/min (ref >60)
Glucose, Bld: 115 mg/dL — ABNORMAL HIGH (ref 70–99)
Potassium: 3.2 mmol/L — ABNORMAL LOW (ref 3.5–5.1)
Sodium: 139 mmol/L (ref 135–145)
Total Bilirubin: 0.5 mg/dL (ref 0.3–1.2)
Total Protein: 6 g/dL — ABNORMAL LOW (ref 6.5–8.1)

## 2019-02-22 LAB — FERRITIN: Ferritin: 42 ng/mL (ref 24–336)

## 2019-02-22 LAB — D-DIMER, QUANTITATIVE: D-Dimer, Quant: 2.5 ug/mL-FEU — ABNORMAL HIGH (ref 0.00–0.50)

## 2019-02-22 LAB — PHOSPHORUS: Phosphorus: 3.6 mg/dL (ref 2.5–4.6)

## 2019-02-22 LAB — MAGNESIUM: Magnesium: 1.7 mg/dL (ref 1.7–2.4)

## 2019-02-22 MED ORDER — SODIUM CHLORIDE 0.9 % IV BOLUS
1000.0000 mL | Freq: Once | INTRAVENOUS | Status: AC
  Administered 2019-02-22: 1000 mL via INTRAVENOUS

## 2019-02-22 MED ORDER — ACETAMINOPHEN 325 MG PO TABS: 650.0000 mg | ORAL_TABLET | Freq: Four times a day (QID) | ORAL | Status: DC | PRN

## 2019-02-22 MED ORDER — SODIUM CHLORIDE 0.9 % IV SOLN
INTRAVENOUS | Status: DC
  Administered 2019-02-22: 20:00:00 via INTRAVENOUS

## 2019-02-22 MED ORDER — HYDROCODONE-ACETAMINOPHEN 5-325 MG PO TABS
1.0000 | ORAL_TABLET | Freq: Four times a day (QID) | ORAL | Status: DC | PRN
  Administered 2019-02-22 – 2019-03-03 (×5): 1 via ORAL
  Filled 2019-02-22 (×5): qty 1

## 2019-02-22 MED ORDER — GUAIFENESIN-DM 100-10 MG/5ML PO SYRP: 10.0000 mL | ORAL_SOLUTION | ORAL | Status: DC | PRN

## 2019-02-22 MED ORDER — ZINC SULFATE 220 (50 ZN) MG PO CAPS
220.0000 mg | ORAL_CAPSULE | Freq: Every day | ORAL | Status: DC
  Administered 2019-02-22 – 2019-03-04 (×10): 220 mg via ORAL
  Filled 2019-02-22 (×12): qty 1

## 2019-02-22 MED ORDER — SODIUM CHLORIDE 0.9% FLUSH
3.0000 mL | Freq: Two times a day (BID) | INTRAVENOUS | Status: DC
  Administered 2019-02-23 – 2019-02-28 (×3): 3 mL via INTRAVENOUS

## 2019-02-22 MED ORDER — ADULT MULTIVITAMIN W/MINERALS CH
1.0000 | ORAL_TABLET | Freq: Every day | ORAL | Status: DC
  Administered 2019-02-22 – 2019-03-04 (×10): 1 via ORAL
  Filled 2019-02-22 (×13): qty 1

## 2019-02-22 MED ORDER — ONDANSETRON HCL 4 MG PO TABS
4.0000 mg | ORAL_TABLET | Freq: Four times a day (QID) | ORAL | Status: DC | PRN
  Administered 2019-02-22 – 2019-03-04 (×2): 4 mg via ORAL
  Filled 2019-02-22 (×2): qty 1

## 2019-02-22 MED ORDER — VITAMIN C 500 MG PO TABS
500.0000 mg | ORAL_TABLET | Freq: Every day | ORAL | Status: DC
  Administered 2019-02-22 – 2019-03-04 (×10): 500 mg via ORAL
  Filled 2019-02-22 (×12): qty 1

## 2019-02-22 MED ORDER — HYDROCOD POLST-CPM POLST ER 10-8 MG/5ML PO SUER: 5.0000 mL | Freq: Two times a day (BID) | ORAL | Status: DC | PRN

## 2019-02-22 MED ORDER — FOLIC ACID 1 MG PO TABS
1.0000 mg | ORAL_TABLET | Freq: Every day | ORAL | Status: DC
  Administered 2019-02-22 – 2019-03-04 (×10): 1 mg via ORAL
  Filled 2019-02-22 (×12): qty 1

## 2019-02-22 MED ORDER — LORAZEPAM 1 MG PO TABS: 1.0000 mg | ORAL_TABLET | ORAL | Status: DC | PRN

## 2019-02-22 MED ORDER — ALBUTEROL SULFATE HFA 108 (90 BASE) MCG/ACT IN AERS
2.0000 | INHALATION_SPRAY | Freq: Four times a day (QID) | RESPIRATORY_TRACT | Status: DC | PRN
  Filled 2019-02-22: qty 6.7

## 2019-02-22 MED ORDER — LORAZEPAM 2 MG/ML IJ SOLN
1.0000 mg | INTRAMUSCULAR | Status: DC | PRN
  Filled 2019-02-22: qty 2

## 2019-02-22 MED ORDER — ONDANSETRON HCL 4 MG/2ML IJ SOLN: 4.0000 mg | Freq: Four times a day (QID) | INTRAMUSCULAR | Status: DC | PRN

## 2019-02-22 NOTE — ED Notes (Signed)
Lunch ordered 

## 2019-02-22 NOTE — ED Notes (Signed)
Pt. Requesting something for pain.

## 2019-02-22 NOTE — H&P (Addendum)
History and Physical    [name] IWO:[id number] DOB: May 14, 1960 DOA: 02/20/2019  Referring MD/NP/PA: Eber Hong, MD PCP: [facility]  Patient coming from: home  Chief Complaint: Chest and abdominal pain  I have personally briefly reviewed patient's old medical records in [facility]   HPI: [name] is a 58 y.o. male with medical history significant of hypertension, hyperlipidemia, hepatitis C untreated, polysubstance  abuse(cocaine, tobacco, alcohol), bipolar 1 disorder, anxiety, and GERD.  History is mostly obtained from review of records as patient  did not answer most of questions asked and was seen to be tearful.  He presented with complaints of chest and abdominal pain.  He reportedly recently moved from [place], but has been unable to get his medications in over a month at the [name]. He complained of burning substernal chest pain worse with breathing and movement and right upper quadrant abdominal pain.  Associated symptoms included tingling in right leg, nausea, vomiting, and reports of diarrhea.  Records show that patient stated that he has been crying, thinking about taking his gun to kill someone unnamed, and then shooting himself.  Patient has had previous suicide attempts in the past when his daughter died.  He had recently cocaine and drinking a lot of alcohol 4 days ago.  ED Course: Upon admission into the emergency department patient has had stable vital signs.  Labs from 11/12 significant for lipase 76, AST 80, and ALT 247.  Troponins have been negative and there is no signs of any acute abnormalities on EKG.  Urine drug screen was positive for amphetamines, cocaine, and marijuana.  Chest x-ray showed coronary artery calcification consistent with coronary artery disease and a normal heart size.  CT scan of the abdomen pelvis did not show any acute abnormalities.  By the behavioral health counselor who recommended inpatient admission.  However,  COVID-19 screening was positive.  TRH recommended admit with telemetry psychiatry at [facility].  Review of Systems  Unable to perform ROS: Psychiatric disorder  Respiratory: Positive for shortness of breath.   Cardiovascular: Positive for chest pain.  Gastrointestinal: Positive for abdominal pain, diarrhea, nausea and vomiting.    Past Medical History:  Diagnosis Date   Alcohol abuse    Anxiety    Arthritis    "all over" (11/25/2015)   Bipolar 1 disorder (HCC)    Bipolar affective disorder (HCC)    Chronic back pain    Chronic kidney disease    Cocaine abuse (HCC)    smokes crack-cocaine   Depression    GERD (gastroesophageal reflux disease)    Gout    Headache    "q couple days; stress, tension, anger" (11/25/2015)   Hemorrhoids    Hepatitis C    "not yet treated" (11/25/2015)   Hypertension    Insomnia    Myocardial infarction (HCC) 11/2015   "supposedly" (11/25/2015)   OSA on CPAP    "had mask; it was stolen" (11/25/2015)   PTSD (post-traumatic stress disorder)    Tobacco abuse    Vitamin D deficiency     Past Surgical History:  Procedure Laterality Date   CARDIAC CATHETERIZATION N/A 11/29/2015   Procedure: Left Heart Cath and Coronary Angiography;  Surgeon: Laurey Morale, MD;  Location: [facility] INVASIVE CV LAB;  Service: Cardiovascular;  Laterality: N/A;   FRACTURE SURGERY     PATELLA FRACTURE SURGERY Left ~ 1995   TONSILLECTOMY       reports that he has been smoking.  He has a 5.52 pack-year smoking history. He has never used smokeless tobacco. He reports current alcohol use. He reports current drug use. Drugs: Marijuana, "Crack" cocaine, and Cocaine.  Allergies  Allergen Reactions   Lactulose Hives and Nausea Only   Lactose Intolerance (Gi) Hives and Nausea Only    Family History  Problem Relation Age of Onset   Hypertension Brother    Diabetes Brother    Hypertension Mother    Diabetes Mother    Arthritis Mother      Other Sister        bone disease    Prior to Admission medications   Medication Sig Start Date End Date Taking? Authorizing Provider  acetaminophen (TYLENOL) 500 MG tablet Take 500-1,000 mg by mouth 3 (three) times daily as needed (for pain).    [provider]  amLODipine (NORVASC) 10 MG tablet Take 10 mg by mouth daily.    [provider]  aspirin EC 81 MG tablet Take 81 mg by mouth daily.    [provider]  atenolol (TENORMIN) 25 MG tablet Take 25 mg by mouth daily.    [provider]  baclofen (LIORESAL) 10 MG tablet Take 10 mg by mouth 3 (three) times daily as needed.    [provider]  benzonatate (TESSALON) 100 MG capsule Take 1 capsule (100 mg total) by mouth 2 (two) times daily as needed for cough. 05/27/16   Penny Pia, MD  cetirizine (ZYRTEC) 10 MG tablet Take 10 mg by mouth daily.    [provider]  clopidogrel (PLAVIX) 75 MG tablet Take 75 mg by mouth daily. 01/19/17   [provider]  dicyclomine (BENTYL) 20 MG tablet Take 20 mg by mouth as needed (abdominal cramps).  07/08/18   [provider]  diltiazem (CARDIZEM) 60 MG tablet Take 120 mg by mouth daily.    [provider]  diphenhydrAMINE (BENADRYL) 25 MG tablet Take 25 mg by mouth at bedtime as needed for sleep.     [provider]  gabapentin (NEURONTIN) 300 MG capsule Take 1 capsule (300 mg total) by mouth 3 (three) times daily. Patient taking differently: Take 800 mg by mouth 3 (three) times daily.  02/01/15   Withrow, Everardo All, FNP  gabapentin (NEURONTIN) 300 MG capsule Take 1 capsule (300 mg total) by mouth 3 (three) times daily. 12/26/18 01/25/19  Dolan Amen, MD  lamoTRIgine (LAMICTAL) 25 MG tablet Take 25 mg by mouth at bedtime.    [provider]  lisinopril (ZESTRIL) 40 MG tablet Take 40 mg by mouth daily.    [provider]  Melatonin 3 MG TABS Take 6 mg by mouth as needed.    [provider]   naloxone [facility]) nasal spray 4 mg/0.1 mL Place 4 mg into the nose once.    [provider]  Omega-3 Fatty Acids (FISH OIL) 1000 MG CAPS Take 2,000 mg by mouth daily.    [provider]  pantoprazole (PROTONIX) 40 MG tablet Take 1 tablet (40 mg total) by mouth daily. 02/01/15   Withrow, Everardo All, FNP  pravastatin (PRAVACHOL) 20 MG tablet Take 20 mg by mouth daily.    [provider]    Physical Exam:  Constitutional: Middle-age male who appears to be tearful and not readily answering questions, but follows commands Vitals:   02/21/19 1300 02/21/19 2038 02/22/19 0400 02/22/19 0853  BP: 127/76 108/71 110/68 (!) 149/84  Pulse: (!) 52 65 72 64  Resp: 18 16  18 17  Temp: 98.3 F (36.8 C)  98.9 F (37.2 C) 98.5 F (36.9 C)  TempSrc: Oral  Oral Oral  SpO2: 94% 97% 98% 95%  Weight:      Height:       Eyes: PERRL, lids and conjunctivae normal ENMT: Mucous membranes are moist. Posterior pharynx clear of any exudate or lesions.  Neck: normal, supple, no masses, no thyromegaly Respiratory: clear to auscultation bilaterally, no wheezing, no crackles. Normal respiratory effort. No accessory muscle use.  Cardiovascular: Regular rate and rhythm, no murmurs / rubs / gallops. No extremity edema. 2+ pedal pulses. No carotid bruits.  Abdomen: no tenderness, no masses palpated. No hepatosplenomegaly. Bowel sounds positive.  Musculoskeletal: no clubbing / cyanosis. No joint deformity upper and lower extremities. Good ROM, no contractures. Normal muscle tone.  Skin: no rashes, lesions, ulcers. No induration Neurologic: CN 2-12 grossly intact. Sensation intact, DTR normal. Strength 5/5 in all 4.  Psychiatric:  Alert and oriented x 3.  Depressed mood.    Labs on Admission: I have personally reviewed following labs and imaging studies  CBC: Recent Labs  Lab 02/20/19 0444  WBC 8.3  HGB 15.9  HCT 46.3  MCV 84.3  PLT 947   Basic Metabolic Panel: Recent Labs  Lab  02/20/19 0444  NA 139  K 3.8  CL 106  CO2 23  GLUCOSE 108*  BUN <5*  CREATININE 1.00  CALCIUM 9.3   GFR: Estimated Creatinine Clearance: 99.9 mL/min (by C-G formula based on SCr of 1 mg/dL). Liver Function Tests: Recent Labs  Lab 02/20/19 0753  AST 80*  ALT 274*  ALKPHOS 54  BILITOT 0.5  PROT 6.9  ALBUMIN 3.8   Recent Labs  Lab 02/20/19 0753  LIPASE 76*   No results for input(s): AMMONIA in the last 168 hours. Coagulation Profile: No results for input(s): INR, PROTIME in the last 168 hours. Cardiac Enzymes: No results for input(s): CKTOTAL, CKMB, CKMBINDEX, TROPONINI in the last 168 hours. BNP (last 3 results) No results for input(s): PROBNP in the last 8760 hours. HbA1C: No results for input(s): HGBA1C in the last 72 hours. CBG: No results for input(s): GLUCAP in the last 168 hours. Lipid Profile: No results for input(s): CHOL, HDL, LDLCALC, TRIG, CHOLHDL, LDLDIRECT in the last 72 hours. Thyroid Function Tests: No results for input(s): TSH, T4TOTAL, FREET4, T3FREE, THYROIDAB in the last 72 hours. Anemia Panel: No results for input(s): VITAMINB12, FOLATE, FERRITIN, TIBC, IRON, RETICCTPCT in the last 72 hours. Urine analysis:    Component Value Date/Time   COLORURINE YELLOW 12/24/2018 0810   APPEARANCEUR CLEAR 12/24/2018 0810   LABSPEC 1.021 12/24/2018 0810   PHURINE 5.0 12/24/2018 0810   GLUCOSEU NEGATIVE 12/24/2018 0810   HGBUR NEGATIVE 12/24/2018 0810   BILIRUBINUR NEGATIVE 12/24/2018 0810   KETONESUR NEGATIVE 12/24/2018 0810   PROTEINUR NEGATIVE 12/24/2018 0810   UROBILINOGEN 1.0 12/16/2014 2312   NITRITE NEGATIVE 12/24/2018 0810   LEUKOCYTESUR NEGATIVE 12/24/2018 0810   Sepsis Labs: Recent Results (from the past 240 hour(s))  SARS CORONAVIRUS 2 (TAT 6-24 HRS) Nasopharyngeal Nasopharyngeal Swab     Status: Abnormal   Collection Time: 02/20/19  7:31 AM   Specimen: Nasopharyngeal Swab  Result Value Ref Range Status   SARS Coronavirus 2 POSITIVE (A)  NEGATIVE Final    Comment: RESULT CALLED TO, READ BACK BY AND VERIFIED WITH: R HARDY,RN 1635 02/20/2019 D BRADLEY (NOTE) SARS-CoV-2 target nucleic acids are DETECTED. The SARS-CoV-2 RNA is generally detectable in upper and lower  respiratory specimens during the acute phase of infection. Positive results are indicative of active infection with SARS-CoV-2. Clinical  correlation with patient history and other diagnostic information is necessary to determine patient infection status. Positive results do  not rule out bacterial infection or co-infection with other viruses. The expected result is Negative. Fact Sheet for Patients: HairSlick.no Fact Sheet for Healthcare Providers: quierodirigir.com This test is not yet approved or cleared by the [place] FDA and  has been authorized for detection and/or diagnosis of SARS-CoV-2 by FDA under an Emergency Use Authorization (EUA). This EUA will remain  in effect (meaning this test can be used) for  the duration of the COVID-19 declaration under Section 564(b)(1) of the Act, 21 U.S.C. section 360bbb-3(b)(1), unless the authorization is terminated or revoked sooner. Performed at [facility], [address] [place], [place] [address]      Radiological Exams on Admission: No results found.  EKG: Independently reviewed.  Sinus rhythm at 83 bpm  Assessment/Plan Suicidal/homicidal ideation, depression: Acute.  Patient presents with reports of wanting to harm someone and then himself with a gun.  Patient was evaluated by the behavioral health counselor and inpatient admission recommended. -Admit to a MedSurg bed at [facility] -Tele psychiatry   COVID-19 infection: Patient does report shortness of breath.  O2 saturations have been maintained.  Chest x-ray low lung volumes with bibasilar atelectasis unchanged from previous exam. -COVID-19 order set utilized -Nasal  cannula oxygen as needed to maintain O2 saturations greater than 90%. -Albuterol inhaler as needed -Antitussives as needed -Vitamin C and zinc -Continue monitoring inflammatory markers  Nausea and vomiting: Acute. -Diet as tolerated -Antiemetics as needed -Normal saline IV fluids at 75 mL/h  Chest and abdominal pain: Acute.  Patient reports having chest and abdominal pain.  High-sensitivity troponins were negative x2 and no significant EKG changes are appreciated.  Chest x-ray did show coronary artery calcifications consistent with coronary artery disease.  No acute abnormalities noted on CT imaging of the abdomen and pelvis.  Unclear if this is secondary to recent drug use/withdrawals.  -May benefit from referral to cardiology in outpatient setting  Polysubstance abuse: Urine drug screen was positive for amphetamines, cocaine, and marijuana. -Patient we will need continued counseling on need cessation of illicit drugs  Essential hypertension: Patient on amlodipine, atenolol, lisinopril, and Cardizem at home. -Continue home regimen  Elevated lipase and transaminitis: Acute on chronic.  Patient's lipase elevated at 76 on admission with AST 80 and ALT 274 on 11/12.  CT scan of the abdomen pelvis did not reveal any acute abnormalities however. -IV fluids -Recheck CMP  Hyperlipidemia -Continue pravastatin  History of hepatitis C: Reportedly untreated. -Will need to follow-up in outpatient setting with GI for treatment  DVT prophylaxis: Lovenox Code Status: Full Family Communication: No family present at bedside Disposition Plan: To be determined Consults called: Psychiatry Admission status: Inpatient  Clydie Braun MD [facility] Pager [phone]   If 7PM-7AM, please contact night-coverage [email or web address] Password [facility]  02/22/2019, 10:54 AM

## 2019-02-22 NOTE — ED Provider Notes (Signed)
I have discussed the case with hospitalist who will facilitate transfer to [facility] for inpatient treatment due to Covid + status - Pt has been medically stable.  Appreciate Dr. Tamala Julian and his willingness to admit   [name] Chapel, MD 02/22/19 1000

## 2019-02-22 NOTE — ED Notes (Signed)
[place], [facility], [facility] Psychiatrist needs to request [facility] bed for this pt d/t COVID positive.

## 2019-02-22 NOTE — ED Notes (Signed)
Pt moved onto a hospital bed

## 2019-02-22 NOTE — ED Notes (Signed)
Breakfast ordered for patient  

## 2019-02-23 ENCOUNTER — Other Ambulatory Visit: Payer: Self-pay

## 2019-02-23 DIAGNOSIS — R1084 Generalized abdominal pain: Secondary | ICD-10-CM

## 2019-02-23 DIAGNOSIS — R072 Precordial pain: Secondary | ICD-10-CM

## 2019-02-23 DIAGNOSIS — F1920 Other psychoactive substance dependence, uncomplicated: Secondary | ICD-10-CM

## 2019-02-23 DIAGNOSIS — R1013 Epigastric pain: Secondary | ICD-10-CM

## 2019-02-23 DIAGNOSIS — F1414 Cocaine abuse with cocaine-induced mood disorder: Secondary | ICD-10-CM

## 2019-02-23 DIAGNOSIS — F1420 Cocaine dependence, uncomplicated: Secondary | ICD-10-CM

## 2019-02-23 DIAGNOSIS — R45851 Suicidal ideations: Secondary | ICD-10-CM | POA: Diagnosis not present

## 2019-02-23 DIAGNOSIS — U071 COVID-19: Secondary | ICD-10-CM | POA: Diagnosis not present

## 2019-02-23 LAB — COMPREHENSIVE METABOLIC PANEL
ALT: 141 U/L — ABNORMAL HIGH (ref 0–44)
AST: 34 U/L (ref 15–41)
Albumin: 3.4 g/dL — ABNORMAL LOW (ref 3.5–5.0)
Alkaline Phosphatase: 47 U/L (ref 38–126)
Anion gap: 10 (ref 5–15)
BUN: 7 mg/dL (ref 6–20)
CO2: 24 mmol/L (ref 22–32)
Calcium: 9.2 mg/dL (ref 8.9–10.3)
Chloride: 108 mmol/L (ref 98–111)
Creatinine, Ser: 1.05 mg/dL (ref 0.61–1.24)
GFR calc Af Amer: >60 mL/min (ref >60)
GFR calc non Af Amer: >60 mL/min (ref >60)
Glucose, Bld: 105 mg/dL — ABNORMAL HIGH (ref 70–99)
Potassium: 4.2 mmol/L (ref 3.5–5.1)
Sodium: 142 mmol/L (ref 135–145)
Total Bilirubin: 0.3 mg/dL (ref 0.3–1.2)
Total Protein: 5.9 g/dL — ABNORMAL LOW (ref 6.5–8.1)

## 2019-02-23 LAB — CBC WITH DIFFERENTIAL/PLATELET
Abs Immature Granulocytes: 0.01 10*3/uL (ref 0.00–0.07)
Basophils Absolute: 0 10*3/uL (ref 0.0–0.1)
Basophils Relative: 0 %
Eosinophils Absolute: 0.2 10*3/uL (ref 0.0–0.5)
Eosinophils Relative: 3 %
HCT: 43 % (ref 39.0–52.0)
Hemoglobin: 14.2 g/dL (ref 13.0–17.0)
Immature Granulocytes: 0 %
Lymphocytes Relative: 21 %
Lymphs Abs: 1.4 10*3/uL (ref 0.7–4.0)
MCH: 29.1 pg (ref 26.0–34.0)
MCHC: 33 g/dL (ref 30.0–36.0)
MCV: 88.1 fL (ref 80.0–100.0)
Monocytes Absolute: 0.6 10*3/uL (ref 0.1–1.0)
Monocytes Relative: 8 %
Neutro Abs: 4.6 10*3/uL (ref 1.7–7.7)
Neutrophils Relative %: 68 %
Platelets: 259 10*3/uL (ref 150–400)
RBC: 4.88 MIL/uL (ref 4.22–5.81)
RDW: 13.7 % (ref 11.5–15.5)
WBC: 6.9 10*3/uL (ref 4.0–10.5)
nRBC: 0 % (ref 0.0–0.2)

## 2019-02-23 LAB — PHOSPHORUS: Phosphorus: 2.8 mg/dL (ref 2.5–4.6)

## 2019-02-23 LAB — D-DIMER, QUANTITATIVE: D-Dimer, Quant: 1.36 ug/mL-FEU — ABNORMAL HIGH (ref 0.00–0.50)

## 2019-02-23 LAB — HEPATITIS PANEL, ACUTE
HCV Ab: REACTIVE — AB
Hep A IgM: NONREACTIVE
Hep B C IgM: NONREACTIVE
Hepatitis B Surface Ag: NONREACTIVE

## 2019-02-23 LAB — FERRITIN: Ferritin: 39 ng/mL (ref 24–336)

## 2019-02-23 LAB — C-REACTIVE PROTEIN: CRP: <0.8 mg/dL (ref <1.0)

## 2019-02-23 LAB — MAGNESIUM: Magnesium: 1.5 mg/dL — ABNORMAL LOW (ref 1.7–2.4)

## 2019-02-23 MED ORDER — ENOXAPARIN SODIUM 60 MG/0.6ML ~~LOC~~ SOLN
60.0000 mg | Freq: Every day | SUBCUTANEOUS | Status: DC
  Administered 2019-02-24 – 2019-03-04 (×9): 60 mg via SUBCUTANEOUS
  Filled 2019-02-23 (×11): qty 0.6

## 2019-02-23 MED ORDER — MAGNESIUM SULFATE 2 GM/50ML IV SOLN
2.0000 g | Freq: Once | INTRAVENOUS | Status: DC
  Filled 2019-02-23: qty 50

## 2019-02-23 NOTE — ED Notes (Signed)
Pt is on list for [facility] to transport to [facility], call report to [phone]

## 2019-02-23 NOTE — Progress Notes (Signed)
[facility]  PROGRESS NOTE  [name] SVX:[id number] DOB: [birth year] DOA: 02/20/2019 [facility]CP: [facility] Admit date - 02/20/2019   Admitting Physician Clydie Braun, MD  Outpatient Primary MD for the patient is [facility]  LOS - 1 Brief Narrative   [name] is a 58 y.o. year old male with medical history significant for CAD, hypertension, hyperlipidemia, hepatitis C untreated, polysubstance  abuse(cocaine, tobacco, alcohol), bipolar 1 disorder, depression anxiety, and GERD who presented on 02/20/2019 with burning substernal chest pain worse with breathing and movement and right upper quadrant abdominal pain with nausea vomiting and diarrhea.  Patient also presented with suicidal homicidal ideation, with previous history of suicide attempts and was found to have Covid without clinical signs or radiographic evidence for pneumonia  Hospital course complicated by persistent paranoia, expression of SI/HI, intermittent chest/abdominal pain  Subjective  [name] today reports still having chest pain that feels like a person is "squeezing his heart".  Prior to me coming into his room [name] was lying in his bed comfortably.  He is very upset about the type of food he is eating.  He was distracted and tell me about his chest pain as he reports he is paranoid about the care he is receiving while waiting in the emergency department.  He also reports persistent abdominal pain but without nausea or vomiting, he has finished his breakfast tray which is at his bedsid  A & P    1. Homicidal and suicidal ideation in patient with depression, anxiety, bipolar disorder, polysubstance abuse and previous history of suicidal attempts.  Assessed by behavioral health patient meets inpatient criteria.  Will need to go to [facility] with telemetry psychiatry due to Covid positive status.  2. Covid positive infection.  Has subjective shortness of breath, chest x-ray  shows bibasilar atelectasis without any signs of opacities concerning for any pneumonia.  Stable on room air.  D-dimer elevated, CRP and ferritin within normal limits.  As needed albuterol, antitussives as needed, vitamin C and zinc.  Continue to monitor serial inflammatory markers, does not currently meet criteria for remdesivir or Decadron  3. Acute on chronic epigastric abdominal pain.  Slightly elevated at lipase on admission, still tolerating p.o. without vomiting, abdominal exam is nonacute.  CT scan unremarkable.  likely related to concomitant COVID-19 infection. Continue PPI and IVF, antiemetics PRN  4. Transaminitis.  Has history of hepatitis C.  Elevated LFTs could be related to COVID-19 infection.  Will check hepatitis panel given patient's active substance abuse  5. Atypical chest pain with history of CAD.  Does have CAD history, very poor historian as he is distracted by his reported paranoia.  Last left heart cath 11/2015 showed diffuse nonobstructive coronary disease.  Nonischemic EKG, troponin trend x2 unremarkable, hemodynamically stable.  CTA for similar presentation 12/2018 negative for PE. Likely related to Covid infection, other etiologies include  recent cocaine use.  Continue home aspirin and plavix  6. Hypomagnesemia in setting of alcohol abuse.  Supplement as needed, continue to monitor daily  7. Polysubstance abuse.  UDS positive for amphetamines, cocaine, marijuana.  Social worker consulted for continued counseling.  8. HTN, normotensive.  Continue home amlodipine, atenolol, lisinopril, Cardizem  9. Alcohol abuse.  No current signs of withdrawal.  Monitor on CIWA protocol.  Folic acid, thiamine supplementation  10. History of HCV.  Check HCVRNA     Family Communication  :  No family at bedside  Code Status :  FULL  Disposition Plan  :  Tx to [facility] due to COVID + and still persists with SI or HI needing inpatient psych criteria  Consults  :  Psych   Procedures  :  none  DVT Prophylaxis  :  Lovenox  Lab Results  Component Value Date   PLT 259 02/23/2019    Diet :  Diet Order            Diet regular Room service appropriate? Yes; Fluid consistency: Thin  Diet effective now               Inpatient Medications Scheduled Meds: . amLODipine  10 mg Oral Daily  . aspirin EC  81 mg Oral Daily  . atenolol  25 mg Oral Daily  . clopidogrel  75 mg Oral Daily  . folic acid  1 mg Oral Daily  . gabapentin  800 mg Oral TID  . lisinopril  40 mg Oral Daily  . multivitamin with minerals  1 tablet Oral Daily  . pantoprazole  40 mg Oral Daily  . pravastatin  20 mg Oral Daily  . sodium chloride flush  3 mL Intravenous Q12H  . thiamine  100 mg Oral Daily   Or  . thiamine  100 mg Intravenous Daily  . vitamin C  500 mg Oral Daily  . zinc sulfate  220 mg Oral Daily   Continuous Infusions: . sodium chloride 75 mL/hr at 02/22/19 2011  . magnesium sulfate bolus IVPB     PRN Meds:.acetaminophen, albuterol, dicyclomine, guaiFENesin-dextromethorphan, HYDROcodone-acetaminophen, LORazepam **OR** LORazepam, Melatonin, ondansetron **OR** ondansetron (ZOFRAN) IV  Antibiotics  :   Anti-infectives (From admission, onward)   None       Objective   Vitals:   02/22/19 2211 02/22/19 2212 02/22/19 2213 02/23/19 0607  BP: 127/78 127/78  (!) 136/95  Pulse: 74 74  69  Resp:    18  Temp:   97.7 F (36.5 C) 97.7 F (36.5 C)  TempSrc:   Oral Oral  SpO2: 97%   99%  Weight:      Height:        SpO2: 99 %  Wt Readings from Last 3 Encounters:  02/20/19 120.2 kg  12/26/18 111.4 kg  08/07/16 111.1 kg     Intake/Output Summary (Last 24 hours) at 02/23/2019 0809 Last data filed at 02/23/2019 0328 Gross per 24 hour  Intake 720 ml  Output 3800 ml  Net -3080 ml    Physical Exam:  Awake Alert, Oriented X 3,  Anxious, paranoid, still expressing SI/HI No new F.N deficits,  [place].AT, Symmetrical Chest wall movement, Good air movement  bilaterally, CTAB RRR,No Gallops,Rubs or new Murmurs,  +ve B.Sounds, Abd Soft, reproducible tenderness in epigastric area without rebound tenderness or guarding or rigidity  No Cyanosis, Clubbing or edema, No new Rash or bruise     I have personally reviewed the following:   Data Reviewed:  CBC Recent Labs  Lab 02/20/19 0444 02/22/19 1330 02/23/19 0326  WBC 8.3 7.3 6.9  HGB 15.9 15.0 14.2  HCT 46.3 44.0 43.0  PLT 288 243 259  MCV 84.3 84.5 88.1  MCH 29.0 28.8 29.1  MCHC 34.3 34.1 33.0  RDW 13.7 13.6 13.7  LYMPHSABS  --  1.7 1.4  MONOABS  --  0.5 0.6  EOSABS  --  0.3 0.2  BASOSABS  --  0.0 0.0    Chemistries  Recent Labs  Lab 02/20/19 0444 02/20/19 0753 02/22/19 1330 02/23/19 0326  NA 139  --  139 142  K 3.8  --  3.2* 4.2  CL 106  --  105 108  CO2 23  --  22 24  GLUCOSE 108*  --  115* 105*  BUN <5*  --  6 7  CREATININE 1.00  --  0.89 1.05  CALCIUM 9.3  --  8.9 9.2  MG  --   --  1.7 1.5*  AST  --  80* 44* 34  ALT  --  274* 172* 141*  ALKPHOS  --  54 51 47  BILITOT  --  0.5 0.5 0.3   ------------------------------------------------------------------------------------------------------------------ No results for input(s): CHOL, HDL, LDLCALC, TRIG, CHOLHDL, LDLDIRECT in the last 72 hours.  Lab Results  Component Value Date   HGBA1C 5.8 (H) 11/25/2015   ------------------------------------------------------------------------------------------------------------------ No results for input(s): TSH, T4TOTAL, T3FREE, THYROIDAB in the last 72 hours.  Invalid input(s): FREET3 ------------------------------------------------------------------------------------------------------------------ Recent Labs    02/22/19 1330 02/23/19 0326  FERRITIN 42 39    Coagulation profile No results for input(s): INR, PROTIME in the last 168 hours.  Recent Labs    02/22/19 1330 02/23/19 0326  DDIMER 2.50* 1.36*    Cardiac Enzymes No results for input(s): CKMB,  TROPONINI, MYOGLOBIN in the last 168 hours.  Invalid input(s): CK ------------------------------------------------------------------------------------------------------------------ No results found for: BNP  Micro Results Recent Results (from the past 240 hour(s))  SARS CORONAVIRUS 2 (TAT 6-24 HRS) Nasopharyngeal Nasopharyngeal Swab     Status: Abnormal   Collection Time: 02/20/19  7:31 AM   Specimen: Nasopharyngeal Swab  Result Value Ref Range Status   SARS Coronavirus 2 POSITIVE (A) NEGATIVE Final    Comment: RESULT CALLED TO, READ BACK BY AND VERIFIED WITH: R HARDY,RN 1635 02/20/2019 D BRADLEY (NOTE) SARS-CoV-2 target nucleic acids are DETECTED. The SARS-CoV-2 RNA is generally detectable in upper and lower respiratory specimens during the acute phase of infection. Positive results are indicative of active infection with SARS-CoV-2. Clinical  correlation with patient history and other diagnostic information is necessary to determine patient infection status. Positive results do  not rule out bacterial infection or co-infection with other viruses. The expected result is Negative. Fact Sheet for Patients: HairSlick.no Fact Sheet for Healthcare Providers: quierodirigir.com This test is not yet approved or cleared by the [place] FDA and  has been authorized for detection and/or diagnosis of SARS-CoV-2 by FDA under an Emergency Use Authorization (EUA). This EUA will remain  in effect (meaning this test can be used) for  the duration of the COVID-19 declaration under Section 564(b)(1) of the Act, 21 U.S.C. section 360bbb-3(b)(1), unless the authorization is terminated or revoked sooner. Performed at [facility], [address] [place], [place] [address]     Radiology Reports Dg Chest 2 View  Result Date: 02/20/2019 CLINICAL DATA:  Chest pain. EXAM: CHEST - 2 VIEW COMPARISON:  CT 12/24/2018.  Chest x-ray  12/24/2018. FINDINGS: Mediastinum hilar structures normal. Heart size normal. Coronary artery calcification noted. Low lung volumes with mild bibasilar atelectasis again noted. No pleural effusion or pneumothorax. No acute bony abnormality identified. IMPRESSION: 1. Coronary artery calcification noted consistent coronary artery disease. Heart size normal. 2. Low lung volumes with mild bibasilar atelectasis again noted. No interim change. Chest is stable from prior exam. Electronically Signed   By: Maisie Fus  Register   On: 02/20/2019 06:07   Ct Abdomen Pelvis W Contrast  Result Date: 02/20/2019 CLINICAL DATA:  Right lower quadrant pain and diarrhea since yesterday morning. EXAM: CT  ABDOMEN AND PELVIS WITH CONTRAST TECHNIQUE: Multidetector CT imaging of the abdomen and pelvis was performed using the standard protocol following bolus administration of intravenous contrast. CONTRAST:  185mL OMNIPAQUE IOHEXOL 300 MG/ML  SOLN COMPARISON:  12/24/2018 FINDINGS: Lower chest: No acute abnormality. Hepatobiliary: No focal liver abnormality is seen. No gallstones, gallbladder wall thickening, or biliary dilatation. Pancreas: Unremarkable. No pancreatic ductal dilatation or surrounding inflammatory changes. Spleen: Normal in size without focal abnormality. Adrenals/Urinary Tract: No adrenal masses. Kidneys are normal in size, orientation and position with symmetric enhancement and excretion. 17 mm cyst, midpole the right kidney, stable. No other renal masses, no stones and no hydronephrosis. Normal ureters. Normal bladder. Stomach/Bowel: Stomach is unremarkable. Small bowel and colon are normal in caliber. No wall thickening. No inflammation. There are several small left colon diverticula without diverticulitis. Normal appendix visualized. Vascular/Lymphatic: Mild aortic atherosclerosis. No aneurysm or other vascular abnormality. No enlarged lymph nodes. Reproductive: Unremarkable. Other: No abdominal wall hernia or  abnormality. No abdominopelvic ascites. Musculoskeletal: No fracture or acute finding. No osteoblastic or osteolytic lesions. IMPRESSION: 1. No acute findings. No findings to account for the patient's pain. No bowel inflammation. Normal appendix visualized. 2. Mild aortic atherosclerosis. Electronically Signed   By: Lajean Manes M.D.   On: 02/20/2019 10:18     Time Spent in minutes  30     Desiree Hane M.D on 02/23/2019 at 8:09 AM  To page go to [email or web address] - password [facility]

## 2019-02-23 NOTE — ED Notes (Signed)
GV-MS 

## 2019-02-23 NOTE — ED Notes (Signed)
PT refused vitals

## 2019-02-23 NOTE — ED Notes (Signed)
Pt is agitated, he is yelling and screaming. He is dissatisfied with the food. He is verbally abusing the staff. Pt says he wants to leave, he wants to speak with charge nurse, and he wants to speak with is doctor.  CN and security here CN told patient he is able to leave if he chooses to, PT says he just wanting to speak to his doctor now instead of leaving

## 2019-02-23 NOTE — ED Notes (Signed)
Breakfast Ordered 

## 2019-02-23 NOTE — ED Notes (Signed)
Pt refused his medication this morning. States he wants to speak to his doctor before taking them  He states "Tell her I don't want them."  -MD was at bedside 10 mins prior -Patient is counseled about importance of each medicine -Attending paged to notify

## 2019-02-23 NOTE — Progress Notes (Signed)
Patient stated that he would like to go to a mental health facility when he is discharged.

## 2019-02-23 NOTE — Progress Notes (Signed)
Brief transfer note:  Seen and examined-lying comfortably in bed. Not on O2. Safety sitter in place.   Continue tx plans as outlined by Dr Teryl Lucy

## 2019-02-23 NOTE — ED Notes (Signed)
Pt given lunch tray.

## 2019-02-24 DIAGNOSIS — F1594 Other stimulant use, unspecified with stimulant-induced mood disorder: Secondary | ICD-10-CM

## 2019-02-24 DIAGNOSIS — F1414 Cocaine abuse with cocaine-induced mood disorder: Secondary | ICD-10-CM

## 2019-02-24 DIAGNOSIS — R45851 Suicidal ideations: Secondary | ICD-10-CM

## 2019-02-24 DIAGNOSIS — U071 COVID-19: Principal | ICD-10-CM

## 2019-02-24 LAB — C-REACTIVE PROTEIN: CRP: <0.8 mg/dL (ref <1.0)

## 2019-02-24 LAB — COMPREHENSIVE METABOLIC PANEL
ALT: 121 U/L — ABNORMAL HIGH (ref 0–44)
AST: 31 U/L (ref 15–41)
Albumin: 3.8 g/dL (ref 3.5–5.0)
Alkaline Phosphatase: 52 U/L (ref 38–126)
Anion gap: 8 (ref 5–15)
BUN: 11 mg/dL (ref 6–20)
CO2: 24 mmol/L (ref 22–32)
Calcium: 9.3 mg/dL (ref 8.9–10.3)
Chloride: 106 mmol/L (ref 98–111)
Creatinine, Ser: 0.85 mg/dL (ref 0.61–1.24)
GFR calc Af Amer: >60 mL/min (ref >60)
GFR calc non Af Amer: >60 mL/min (ref >60)
Glucose, Bld: 105 mg/dL — ABNORMAL HIGH (ref 70–99)
Potassium: 4.2 mmol/L (ref 3.5–5.1)
Sodium: 138 mmol/L (ref 135–145)
Total Bilirubin: 0.4 mg/dL (ref 0.3–1.2)
Total Protein: 6.8 g/dL (ref 6.5–8.1)

## 2019-02-24 LAB — CBC WITH DIFFERENTIAL/PLATELET
Abs Immature Granulocytes: 0.01 10*3/uL (ref 0.00–0.07)
Basophils Absolute: 0 10*3/uL (ref 0.0–0.1)
Basophils Relative: 1 %
Eosinophils Absolute: 0.2 10*3/uL (ref 0.0–0.5)
Eosinophils Relative: 3 %
HCT: 44.5 % (ref 39.0–52.0)
Hemoglobin: 14.7 g/dL (ref 13.0–17.0)
Immature Granulocytes: 0 %
Lymphocytes Relative: 19 %
Lymphs Abs: 1.3 10*3/uL (ref 0.7–4.0)
MCH: 28.4 pg (ref 26.0–34.0)
MCHC: 33 g/dL (ref 30.0–36.0)
MCV: 86.1 fL (ref 80.0–100.0)
Monocytes Absolute: 0.5 10*3/uL (ref 0.1–1.0)
Monocytes Relative: 7 %
Neutro Abs: 4.9 10*3/uL (ref 1.7–7.7)
Neutrophils Relative %: 70 %
Platelets: 248 10*3/uL (ref 150–400)
RBC: 5.17 MIL/uL (ref 4.22–5.81)
RDW: 13.6 % (ref 11.5–15.5)
WBC: 7 10*3/uL (ref 4.0–10.5)
nRBC: 0 % (ref 0.0–0.2)

## 2019-02-24 LAB — HCV RNA QUANT: HCV Quantitative: NOT DETECTED IU/mL (ref >50)

## 2019-02-24 LAB — D-DIMER, QUANTITATIVE: D-Dimer, Quant: 0.65 ug/mL-FEU — ABNORMAL HIGH (ref 0.00–0.50)

## 2019-02-24 LAB — MAGNESIUM: Magnesium: 1.6 mg/dL — ABNORMAL LOW (ref 1.7–2.4)

## 2019-02-24 LAB — FERRITIN: Ferritin: 43 ng/mL (ref 24–336)

## 2019-02-24 LAB — PHOSPHORUS: Phosphorus: 2.5 mg/dL (ref 2.5–4.6)

## 2019-02-24 MED ORDER — HALOPERIDOL LACTATE 5 MG/ML IJ SOLN
5.0000 mg | INTRAMUSCULAR | Status: AC
  Filled 2019-02-24: qty 1

## 2019-02-24 MED ORDER — CLONIDINE HCL 0.1 MG PO TABS: 0.1000 mg | ORAL_TABLET | Freq: Four times a day (QID) | ORAL | Status: DC | PRN

## 2019-02-24 MED ORDER — OLANZAPINE 5 MG PO TABS
5.0000 mg | ORAL_TABLET | Freq: Two times a day (BID) | ORAL | Status: AC
  Administered 2019-02-24 (×2): 5 mg via ORAL
  Filled 2019-02-24 (×2): qty 1

## 2019-02-24 MED ORDER — OLANZAPINE 10 MG PO TABS
10.0000 mg | ORAL_TABLET | Freq: Two times a day (BID) | ORAL | Status: DC
  Administered 2019-02-25 – 2019-03-04 (×15): 10 mg via ORAL
  Filled 2019-02-24 (×20): qty 1

## 2019-02-24 MED ORDER — MAGNESIUM OXIDE 400 (241.3 MG) MG PO TABS
800.0000 mg | ORAL_TABLET | Freq: Two times a day (BID) | ORAL | Status: AC
  Administered 2019-02-24 (×2): 800 mg via ORAL
  Filled 2019-02-24 (×2): qty 2

## 2019-02-24 NOTE — TOC Initial Note (Signed)
Transition of Care [facility]) - Initial/Assessment Note    Patient Details  Name: [name] MRN: [id number] Date of Birth: 1961-02-28  [facility] [name] [facility] CM/SW Contact:    Lawerance Sabal, RN Phone Number: 02/24/2019, 3:00 PM  Clinical Narrative:             Patient under IVC, admitted for suicidal/ homicidal ideation, substance abuse.  Spoke w April Alexander of the [name]. Notified of admission. PCP is Dr Willa Rough at [name] location. Office # [phone] ext [phone] CSW is Dory Horn [phone]  *Patient gets meds at [facility], but did not get medications for past several weeks prior to admission.   Notified April that patient tested +COVID on 11/12, will need quarantine for 14 days per attending. April added him to the behavioral admission list through the [name]. She noted there were several people in front of him, and would likely still be waiting list even after 11/26. She requested we notify her a few days prior to initiate paperwork. Of note patient is now showing coverage w [facility] Medicare as well.  TOC will need to check in with [facility] prior to 11/26 to see if beds will be available at time of DC.        Expected Discharge Plan: Psychiatric Hospital Barriers to Discharge: Continued Medical Work up, Requiring sitter/restraints, Homeless with medical needs   Patient Goals and CMS Choice Patient states their goals for this hospitalization and ongoing recovery are:: did not interview- is currently under IVC      Expected Discharge Plan and Services Expected Discharge Plan: Psychiatric Hospital       Living arrangements for the past 2 months: Homeless                                      Prior Living Arrangements/Services Living arrangements for the past 2 months: Homeless                     Activities of Daily Living Home Assistive Devices/Equipment: None ADL Screening (condition at time of admission) Patient's cognitive ability adequate to safely  complete daily activities?: Yes Is the patient deaf or have difficulty hearing?: No Does the patient have difficulty seeing, even when wearing glasses/contacts?: Yes Does the patient have difficulty concentrating, remembering, or making decisions?: No Patient able to express need for assistance with ADLs?: Yes Does the patient have difficulty dressing or bathing?: No Independently performs ADLs?: Yes (appropriate for developmental age) Does the patient have difficulty walking or climbing stairs?: No Weakness of Legs: None Weakness of Arms/Hands: None  Permission Sought/Granted                  Emotional Assessment              Admission diagnosis:  Suicidal ideation [R45.851] Precordial chest pain [R07.2] Generalized abdominal pain [R10.84] Homicidal ideation [R45.850] Patient Active Problem List   Diagnosis Date Noted  . 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 Suicidal ideation 02/22/2019  . Homicidal ideation 02/22/2019  . COVID-19 virus infection 02/22/2019  . Elevated liver enzymes 02/22/2019  . Elevated lipase 02/22/2019  . Abdominal pain, epigastric 12/25/2018  . Acute encephalopathy 05/25/2016  . Cocaine abuse with cocaine-induced mood disorder (HCC) 03/20/2016  . Abnormal nuclear stress test   . Palpitations 11/26/2015  . Troponin level elevated 11/26/2015  . Homeless 11/26/2015  . Essential hypertension  11/26/2015  . SVT (supraventricular tachycardia) ([place])   . Pain in the chest 11/25/2015  . GERD (gastroesophageal reflux disease)   . Bipolar 1 disorder ([place])   . Back pain, chronic   . Chronic kidney disease   . Affective psychosis, bipolar ([place])   . Alcohol use disorder, severe, dependence ([place])   . Cocaine use disorder, severe, dependence ([place])   . Abdominal pain   . Bipolar I disorder, most recent episode depressed ([place])   . Bipolar disorder, current episode depressed, severe, without psychotic features ([place])   . Bipolar affective  disorder, depressed, severe ([place]) 03/26/2014  . Right hip pain   . Medically noncompliant 03/23/2014  . Dysuria 03/22/2013  . Headache 03/22/2013  . Polysubstance dependence ([place]) 03/21/2012  . Chest pain 02/23/2012  . Anxiety   . Depression   . Polysubstance abuse ([place])   . Gout   . Vitamin D deficiency   . Hepatitis C    PCP:  [facility], [place]:   [place], [place]. [place] [address] Phone: [phone] Fax: [phone]  [facility] [place] [place] - [address] [place] [address] Phone: [phone] Fax: [phone]     Social Determinants of Health (SDOH) Interventions    Readmission Risk Interventions No flowsheet data found.

## 2019-02-24 NOTE — Progress Notes (Signed)
Completed IVC paperwork placed in patient chart.

## 2019-02-24 NOTE — Progress Notes (Signed)
PROGRESS NOTE                                                                                                                                                                                                             Patient Demographics:    [name], is a 58 y.o. male, DOB - 1960/12/19, BBC:[id number]  Outpatient Primary MD for the patient is [facility], [facility]    LOS - 2  Admit date - 02/20/2019    Chief Complaint  Patient presents with  . Chest Pain  . Suicidal  . Homicidal       Brief Narrative   -  COLLYN RIBAS is a 58 y.o. year old male with medical history significant for CAD, hypertension, hyperlipidemia, hepatitis C untreated, polysubstance abuse(cocaine, tobacco, alcohol), bipolar 1 disorder, depression anxiety,andGERD who presented on 02/20/2019 with burning substernal chest pain worse with breathing and movement and right upper quadrant abdominal pain with nausea vomiting and diarrhea.  Patient also presented with suicidal homicidal ideation, with previous history of suicide attempts and was found to have Covid without clinical signs or radiographic evidence for pneumonia, he was seen by psych and they recommended inpatient psych admission, however as he was Covid positive he was sent to [facility] for further care on [facility], ER on 02/23/2019.    Subjective:    Glenard Keesling today has, No headache, No chest pain, No abdominal pain - No Nausea, No new weakness tingling or numbness, no SOB, he still feels sad and wants to go home.   Assessment  & Plan :      1. Homicidal and suicidal ideation in a patient with history of depression, anxiety, bipolar disorder along with substance abuse.  Has been seen already by psych at [facility], ER few days ago and they recommended inpatient psych.  Unfortunately patient has not been IVC then he is now threatening to leave AMA.  He started issuing these threats at  [facility], ER 1 day prior to coming to [facility]  Unfortunately the psych staff did not IVC him there.  He has done this multiple times in the past as well.  I have already consulted psych and consulted social work for [employer] purposes and for the purposes of psych evaluation and management of his aggressive behavior.  He is currently continuing to be  feeling extremely sad but threatening staff and wanting to leave AMA.  He is running around in his room.  We await help from psych and social work.  As needed Haldol if able to administer will be given.   2. Acute Covid 19 Viral infection -incidental finding.  He is stable from the standpoint will be monitored.  COVID-19 Labs  Recent Labs    02/22/19 1330 02/23/19 0326 02/24/19 0220  DDIMER 2.50* 1.36* 0.65*  FERRITIN 42 39 43  CRP  --  <0.8 <0.8    Lab Results  Component Value Date   SARSCOV2NAA POSITIVE (A) 02/20/2019   [place] NEGATIVE 12/24/2018     SpO2: 99 %  Hepatic Function Latest Ref Rng & Units 02/24/2019 02/23/2019 02/22/2019  Total Protein 6.5 - 8.1 g/dL 6.8 5.9(L) 6.0(L)  Albumin 3.5 - 5.0 g/dL 3.8 3.4(L) 3.5  AST 15 - 41 U/L 31 34 44(H)  ALT 0 - 44 U/L 121(H) 141(H) 172(H)  Alk Phosphatase 38 - 126 U/L 52 47 51  Total Bilirubin 0.3 - 1.2 mg/dL 0.4 0.3 0.5  Bilirubin, Direct 0.0 - 0.2 mg/dL - - -     No results found for: BNP   3.  Mild transaminitis.  Trend is improving could be due to COVID-19 infection will monitor.  He also has history of alcohol abuse in the past however this looks like an isolated ALT rise.  4.  Acute on chronic epigastric abdominal pain, stable CT scan.  Now resolved.  On PPI.  5.  Atypical chest pain with history of CAD.  EKG was checked at [facility] x2 this admission and was unremarkable with negative troponin x3.  Continue dual antiplatelet medication which he uses at baseline.  Ongoing cocaine use no beta-blockers.  6.  Polysubstance abuse.  Urine drug screen positive for  amphetamines, cocaine and marijuana.  Told to quit.  Social work and psych consulted.  Also uses alcohol.  Counseled to quit.  Currently no signs of DTs will monitor.  On CIWA protocol.  7.  Hypertension.  Blood pressure stable on Norvasc, lisinopril and Cardizem combination.  Will stop atenolol due to ongoing cocaine use.  Catapres as needed added.   8.  Alcohol abuse.  CIWA protocol.  9. Positive Hep C AB - Outpt GI follow up.    Condition - Fair  Family Communication  :  None  Code Status :  Full  Diet :   Diet Order            Diet regular Room service appropriate? Yes; Fluid consistency: Thin  Diet effective now               Disposition Plan  :  Inpt Psych  Consults  :  Psych and S Work  Procedures  :     PUD Prophylaxis : PPI  DVT Prophylaxis  :  Lovenox    Lab Results  Component Value Date   PLT 248 02/24/2019    Inpatient Medications  Scheduled Meds: . amLODipine  10 mg Oral Daily  . aspirin EC  81 mg Oral Daily  . atenolol  25 mg Oral Daily  . clopidogrel  75 mg Oral Daily  . enoxaparin (LOVENOX) injection  60 mg Subcutaneous Daily  . folic acid  1 mg Oral Daily  . gabapentin  800 mg Oral TID  . haloperidol lactate  5 mg Intramuscular STAT  . lisinopril  40 mg Oral Daily  . multivitamin with minerals  1 tablet Oral Daily  . pantoprazole  40 mg Oral Daily  . pravastatin  20 mg Oral Daily  . sodium chloride flush  3 mL Intravenous Q12H  . thiamine  100 mg Oral Daily   Or  . thiamine  100 mg Intravenous Daily  . vitamin C  500 mg Oral Daily  . zinc sulfate  220 mg Oral Daily   Continuous Infusions: . sodium chloride 75 mL/hr at 02/22/19 2011  . magnesium sulfate bolus IVPB     PRN Meds:.acetaminophen, albuterol, dicyclomine, guaiFENesin-dextromethorphan, HYDROcodone-acetaminophen, LORazepam **OR** LORazepam, Melatonin, ondansetron **OR** ondansetron (ZOFRAN) IV  Antibiotics  :    Anti-infectives (From admission, onward)   None        Time Spent in minutes  30   Lala Lund M.D on 02/24/2019 at 9:05 AM  To page go to [email or web address] - password [facility]  [facility] -  Office  [phone]    See all Orders from today for further details    Objective:   Vitals:   02/23/19 1954 02/24/19 0012 02/24/19 0342 02/24/19 0740  BP:   (!) 146/96 (!) 143/96  Pulse:   76 99  Resp:   18 18  Temp:  98.9 F (37.2 C) 98.6 F (37 C) 98 F (36.7 C)  TempSrc:  Oral Oral Oral  SpO2:   100% 99%  Weight: 120.2 kg     Height: _0  (1.702 m)       Wt Readings from Last 3 Encounters:  02/23/19 120.2 kg  12/26/18 111.4 kg  08/07/16 111.1 kg     Intake/Output Summary (Last 24 hours) at 02/24/2019 0905 Last data filed at 02/24/2019 0205 Gross per 24 hour  Intake 420 ml  Output -  Net 420 ml     Physical Exam  Awake Alert,   No new F.N deficits, aggressive affect [place].AT,PERRAL Supple Neck,No JVD, No cervical lymphadenopathy appriciated.  Symmetrical Chest wall movement, Good air movement bilaterally, CTAB RRR,No Gallops,Rubs or new Murmurs, No Parasternal Heave +ve B.Sounds, Abd Soft, No tenderness, No organomegaly appriciated, No rebound - guarding or rigidity. No Cyanosis, Clubbing or edema, No new Rash or bruise       Data Review:    CBC Recent Labs  Lab 02/20/19 0444 02/22/19 1330 02/23/19 0326 02/24/19 0220  WBC 8.3 7.3 6.9 7.0  HGB 15.9 15.0 14.2 14.7  HCT 46.3 44.0 43.0 44.5  PLT 288 243 259 248  MCV 84.3 84.5 88.1 86.1  MCH 29.0 28.8 29.1 28.4  MCHC 34.3 34.1 33.0 33.0  RDW 13.7 13.6 13.7 13.6  LYMPHSABS  --  1.7 1.4 1.3  MONOABS  --  0.5 0.6 0.5  EOSABS  --  0.3 0.2 0.2  BASOSABS  --  0.0 0.0 0.0    Chemistries  Recent Labs  Lab 02/20/19 0444 02/20/19 0753 02/22/19 1330 02/23/19 0326 02/24/19 0220  NA 139  --  139 142 138  K 3.8  --  3.2* 4.2 4.2  CL 106  --  105 108 106  CO2 23  --  _1 GLUCOSE 108*  --  115* 105* 105*  BUN <5*  --  _2 CREATININE 1.00  --   0.89 1.05 0.85  CALCIUM 9.3  --  8.9 9.2 9.3  MG  --   --  1.7 1.5* 1.6*  AST  --  80* 44* 34 31  ALT  --  274* 172* 141* 121*  ALKPHOS  --  54 51 47 52  BILITOT  --  0.5 0.5 0.3 0.4   ------------------------------------------------------------------------------------------------------------------ No results for input(s): CHOL, HDL, LDLCALC, TRIG, CHOLHDL, LDLDIRECT in the last 72 hours.  Lab Results  Component Value Date   HGBA1C 5.8 (H) 11/25/2015   ------------------------------------------------------------------------------------------------------------------ No results for input(s): TSH, T4TOTAL, T3FREE, THYROIDAB in the last 72 hours.  Invalid input(s): FREET3  Cardiac Enzymes No results for input(s): CKMB, TROPONINI, MYOGLOBIN in the last 168 hours.  Invalid input(s): CK ------------------------------------------------------------------------------------------------------------------ No results found for: BNP  Micro Results Recent Results (from the past 240 hour(s))  SARS CORONAVIRUS 2 (TAT 6-24 HRS) Nasopharyngeal Nasopharyngeal Swab     Status: Abnormal   Collection Time: 02/20/19  7:31 AM   Specimen: Nasopharyngeal Swab  Result Value Ref Range Status   SARS Coronavirus 2 POSITIVE (A) NEGATIVE Final    Comment: RESULT CALLED TO, READ BACK BY AND VERIFIED WITH: R HARDY,RN 1635 02/20/2019 D BRADLEY (NOTE) SARS-CoV-2 target nucleic acids are DETECTED. The SARS-CoV-2 RNA is generally detectable in upper and lower respiratory specimens during the acute phase of infection. Positive results are indicative of active infection with SARS-CoV-2. Clinical  correlation with patient history and other diagnostic information is necessary to determine patient infection status. Positive results do  not rule out bacterial infection or co-infection with other viruses. The expected result is Negative. Fact Sheet for Patients: SugarRoll.be Fact Sheet  for Healthcare Providers: https://www.woods-mathews.com/ This test is not yet approved or cleared by the [place] FDA and  has been authorized for detection and/or diagnosis of SARS-CoV-2 by FDA under an Emergency Use Authorization (EUA). This EUA will remain  in effect (meaning this test can be used) for  the duration of the COVID-19 declaration under Section 564(b)(1) of the Act, 21 U.S.C. section 360bbb-3(b)(1), unless the authorization is terminated or revoked sooner. Performed at [facility], [place] [address] [place], [place] [address]     Radiology Reports Dg Chest 2 View  Result Date: 02/20/2019 CLINICAL DATA:  Chest pain. EXAM: CHEST - 2 VIEW COMPARISON:  CT 12/24/2018.  Chest x-ray 12/24/2018. FINDINGS: Mediastinum hilar structures normal. Heart size normal. Coronary artery calcification noted. Low lung volumes with mild bibasilar atelectasis again noted. No pleural effusion or pneumothorax. No acute bony abnormality identified. IMPRESSION: 1. Coronary artery calcification noted consistent coronary artery disease. Heart size normal. 2. Low lung volumes with mild bibasilar atelectasis again noted. No interim change. Chest is stable from prior exam. Electronically Signed   By: Marcello Moores  Register   On: 02/20/2019 06:07   Ct Abdomen Pelvis W Contrast  Result Date: 02/20/2019 CLINICAL DATA:  Right lower quadrant pain and diarrhea since yesterday morning. EXAM: CT ABDOMEN AND PELVIS WITH CONTRAST TECHNIQUE: Multidetector CT imaging of the abdomen and pelvis was performed using the standard protocol following bolus administration of intravenous contrast. CONTRAST:  180m OMNIPAQUE IOHEXOL 300 MG/ML  SOLN COMPARISON:  12/24/2018 FINDINGS: Lower chest: No acute abnormality. Hepatobiliary: No focal liver abnormality is seen. No gallstones, gallbladder wall thickening, or biliary dilatation. Pancreas: Unremarkable. No pancreatic ductal dilatation or surrounding inflammatory  changes. Spleen: Normal in size without focal abnormality. Adrenals/Urinary Tract: No adrenal masses. Kidneys are normal in size, orientation and position with symmetric enhancement and excretion. 17 mm cyst, midpole the right kidney, stable. No other renal masses, no stones and no hydronephrosis. Normal ureters. Normal bladder. Stomach/Bowel: Stomach is unremarkable. Small bowel and colon are normal in caliber. No wall thickening. No inflammation. There are several small left colon  diverticula without diverticulitis. Normal appendix visualized. Vascular/Lymphatic: Mild aortic atherosclerosis. No aneurysm or other vascular abnormality. No enlarged lymph nodes. Reproductive: Unremarkable. Other: No abdominal wall hernia or abnormality. No abdominopelvic ascites. Musculoskeletal: No fracture or acute finding. No osteoblastic or osteolytic lesions. IMPRESSION: 1. No acute findings. No findings to account for the patient's pain. No bowel inflammation. Normal appendix visualized. 2. Mild aortic atherosclerosis. Electronically Signed   By: Lajean Manes M.D.   On: 02/20/2019 10:18

## 2019-02-24 NOTE — Consult Note (Signed)
Telepsych Consultation   Reason for Consult:  Substance use and suicidal ideations Referring Physician:  Dr Thedore MinsSingh Location of Patient: COVID hospital Location of Provider: Other: COVID hospital   Patient Identification: [name] MRN:  [id number] Principal Diagnosis: Suicidal ideation Diagnosis:  Principal Problem:   Suicidal ideation Active Problems:   Polysubstance abuse (HCC)   Polysubstance dependence (HCC)   Cocaine abuse with cocaine-induced mood disorder (HCC)   Stimulant-induced mood disorder (HCC)   Chest pain   Hepatitis C   Cocaine use disorder, severe, dependence (HCC)   Abdominal pain   Affective psychosis, bipolar (HCC)   Pain in the chest   Essential hypertension   Abdominal pain, epigastric   Homicidal ideation   COVID-19 virus infection   Elevated liver enzymes   Elevated lipase   Total Time spent with patient: 1 hour  Subjective:   [name] is a 58 y.o. male patient admitted with COVID.  "  I am hurting physically and mentally."  Patient seen and evaluated per telecommunications by this provider.  He is well-known to the mental health services for similar presentations.  Prior to admission he was using cocaine and meth.  He came to the emergency department saying he was suicidal and homicidal.  When he tested positive for Covid, [facility]  Today, he was told the nurses he wanted to leave and had threatening behavior.  "I am sorry that I scare the nurses."  He wants to leave to take care of business but will not contract for safety nor deny that he is suicidal.  No past suicide attempts but multiple threats.  Very savvy in conversation and skirting answers for safety.  He does make it clear that he does not want to hurt anyone else.  Discussed the need for mental health services but considering he is Covid positive admission is not possible at this time.  Discussed options of going to the Covid hotel and receiving virtual  outpatient therapy or discharging in using virtual therapy or [facility].  He would not provide an answer again.  Based on him not answering the question, he was told the recommendation is for him to be under IVC and safety precautions [facility] until he is no longer contagious.  Then he could potentially transfer to a mental health facility for help.  HPI per chart:  [name] is a 58 y.o. male who [facility] with a plan.  Patient states, "I can't stop crying and thinking about people who have hurt me.  I'm thinking about taking my gun killing him and then shooting myself."  Pt refuse to give the name of the individual.  Pt reports having a history of suicide attempts.  Pt states, "I tried to kill myself when my daughter died."  Pt admits to relapsing 4 days ago after 2 years of sobriety.  Pt states, "I had alcohol and cocaine 2 days ago" but did not disclosure the amount.  Pt reports having running thoughts and minimal sleep.  Pt denies A/v-hallucinations.   Pt reside alone.  Pt receives disabilities and does not currently work.  Pt reports that [facility] and [facility].  Pt has not started receiving treatment at [facility] yet.  Pt reports having multiple inpatient MH/SA treatment.  Pt denies having a history of physical, sexual and verbal  abuse.   Past Psychiatric History: polysubstance use d/o  Risk to Self: Suicidal Ideation: Yes-Currently Present Suicidal Intent: Yes-Currently Present Is patient at risk for suicide?: Yes Suicidal Plan?: Yes-Currently Present Specify Current Suicidal Plan: Shoot himself in the head Access to Means: Yes Specify Access to Suicidal Means: gun  What has been your use of drugs/alcohol within the last 12 months?: Alcohol and Cocaine How many times?: 1 Triggers for Past Attempts: Family contact, Other  (Comment)(Daughter died) Intentional Self Injurious Behavior: Cutting Comment - Self Injurious Behavior: jumped in front of a car Risk to Others: Homicidal Ideation: Yes-Currently Present Thoughts of Harm to Others: Yes-Currently Present Comment - Thoughts of Harm to Others: people who mistreated me Current Homicidal Intent: Yes-Currently Present Current Homicidal Plan: Yes-Currently Present Describe Current Homicidal Plan: Murder suicide Access to Homicidal Means: Yes Describe Access to Homicidal Means: Pt report owning a gun Identified Victim: Pt would not say History of harm to others?: Yes Assessment of Violence: In past 6-12 months Does patient have access to weapons?: Yes (Comment) Criminal Charges Pending?: No Does patient have a court date: No Prior Inpatient Therapy: Prior Inpatient Therapy: Yes Prior Therapy Dates: multiple times Prior Therapy Facilty/Provider(s): multiple  Reason for Treatment: MH/SA Prior Outpatient Therapy: Prior Outpatient Therapy: Yes Prior Therapy Dates: ongoing Prior Therapy Facilty/Provider[facility]): [facility] Reason for Treatment: MH/SA Does patient have an ACCT team?: No Does patient have Intensive In-House Services?  : No Does patient have [facility] services? : No Does patient have P4CC services?: No  Past Medical History:  Past Medical History:  Diagnosis Date  . Alcohol abuse   . Anxiety   . Arthritis    "all over" (11/25/2015)  . Bipolar 1 disorder (HCC)   . Bipolar affective disorder (HCC)   . Chronic back pain   . Chronic kidney disease   . Cocaine abuse (HCC)    smokes crack-cocaine  . Depression   . GERD (gastroesophageal reflux disease)   . Gout   . Headache    "q couple days; stress, tension, anger" (11/25/2015)  . Hemorrhoids   . Hepatitis C    "not yet treated" (11/25/2015)  . Hypertension   . Insomnia   . Myocardial infarction [facility]) 11/2015   "supposedly" (11/25/2015)  . OSA on CPAP    "had mask; it was stolen"  (11/25/2015)  . PTSD (post-traumatic stress disorder)   . Tobacco abuse   . Vitamin D deficiency     Past Surgical History:  Procedure Laterality Date  . CARDIAC CATHETERIZATION N/A 11/29/2015   Procedure: Left Heart Cath and Coronary Angiography;  Surgeon: Laurey Morale, MD;  Location: [facility] INVASIVE CV LAB;  Service: Cardiovascular;  Laterality: N/A;  . FRACTURE SURGERY    . PATELLA FRACTURE SURGERY Left ~ 1995  . TONSILLECTOMY     Family History:  Family History  Problem Relation Age of Onset  . Hypertension Brother   . Diabetes Brother   . Hypertension Mother   . Diabetes Mother   . Arthritis Mother   . Other Sister        bone disease   Family Psychiatric  History: see above Social History:  Social History   Substance and Sexual Activity  Alcohol Use Yes   Comment: 11/25/2015 "quit alcohol 6 months ago; used to drink from morning til night"     Social History   Substance and Sexual Activity  Drug Use Yes  . Types: Marijuana, "Crack" cocaine, Cocaine   Comment: states  that he uses marijuana and cocaine every now and then     Social History   Socioeconomic History  . Marital status: Single    Spouse name: Not on file  . Number of children: Not on file  . Years of education: Not on file  . Highest education level: Not on file  Occupational History  . Not on file  Social Needs  . Financial resource strain: Not on file  . Food insecurity    Worry: Not on file    Inability: Not on file  . Transportation needs    Medical: Not on file    Non-medical: Not on file  Tobacco Use  . Smoking status: Current Every Day Smoker    Packs/day: 0.12    Years: 46.00    Pack years: 5.52  . Smokeless tobacco: Never Used  Substance and Sexual Activity  . Alcohol use: Yes    Comment: 11/25/2015 "quit alcohol 6 months ago; used to drink from morning til night"  . Drug use: Yes    Types: Marijuana, "Crack" cocaine, Cocaine    Comment: states that he uses marijuana and cocaine  every now and then   . Sexual activity: Not Currently  Lifestyle  . Physical activity    Days per week: Not on file    Minutes per session: Not on file  . Stress: Not on file  Relationships  . Social Musician on phone: Not on file    Gets together: Not on file    Attends religious service: Not on file    Active member of club or organization: Not on file    Attends meetings of clubs or organizations: Not on file    Relationship status: Not on file  Other Topics Concern  . Not on file  Social History Narrative   ** Merged History Encounter **       Lives alone in [place], [place]   Additional Social History:    Allergies:   Allergies  Allergen Reactions  . Lactulose Hives and Nausea Only  . Lactose Intolerance (Gi) Hives and Nausea Only    Labs:  Results for orders placed or performed during the hospital encounter of 02/20/19 (from the past 48 hour(s))  CBC with Differential/Platelet     Status: None   Collection Time: 02/22/19  1:30 PM  Result Value Ref Range   WBC 7.3 4.0 - 10.5 K/uL   RBC 5.21 4.22 - 5.81 MIL/uL   Hemoglobin 15.0 13.0 - 17.0 g/dL   HCT 45.8 59.2 - 92.4 %   MCV 84.5 80.0 - 100.0 fL   MCH 28.8 26.0 - 34.0 pg   MCHC 34.1 30.0 - 36.0 g/dL   RDW 46.2 86.3 - 81.7 %   Platelets 243 150 - 400 K/uL   nRBC 0.0 0.0 - 0.2 %   Neutrophils Relative % 65 %   Neutro Abs 4.7 1.7 - 7.7 K/uL   Lymphocytes Relative 24 %   Lymphs Abs 1.7 0.7 - 4.0 K/uL   Monocytes Relative 7 %   Monocytes Absolute 0.5 0.1 - 1.0 K/uL   Eosinophils Relative 4 %   Eosinophils Absolute 0.3 0.0 - 0.5 K/uL   Basophils Relative 0 %   Basophils Absolute 0.0 0.0 - 0.1 K/uL   Immature Granulocytes 0 %   Abs Immature Granulocytes 0.02 0.00 - 0.07 K/uL    Comment: Performed at [facility], [address], [place], [place] [address]  Comprehensive  metabolic panel     Status: Abnormal   Collection Time: 02/22/19  1:30 PM  Result Value Ref Range   Sodium 139 135 - 145  mmol/L   Potassium 3.2 (L) 3.5 - 5.1 mmol/L   Chloride 105 98 - 111 mmol/L   CO2 22 22 - 32 mmol/L   Glucose, Bld 115 (H) 70 - 99 mg/dL   BUN 6 6 - 20 mg/dL   Creatinine, Ser 0.89 0.61 - 1.24 mg/dL   Calcium 8.9 8.9 - 10.3 mg/dL   Total Protein 6.0 (L) 6.5 - 8.1 g/dL   Albumin 3.5 3.5 - 5.0 g/dL   AST 44 (H) 15 - 41 U/L   ALT 172 (H) 0 - 44 U/L   Alkaline Phosphatase 51 38 - 126 U/L   Total Bilirubin 0.5 0.3 - 1.2 mg/dL   GFR calc non Af Amer >60 >60 mL/min   GFR calc Af Amer >60 >60 mL/min   Anion gap 12 5 - 15    Comment: Performed at [place] [address], [place], [place] [address]  D-dimer, quantitative (not at [facility])     Status: Abnormal   Collection Time: 02/22/19  1:30 PM  Result Value Ref Range   D-Dimer, Quant 2.50 (H) 0.00 - 0.50 ug/mL-FEU    Comment: (NOTE) At the manufacturer cut-off of 0.50 ug/mL FEU, this assay has been documented to exclude PE with a sensitivity and negative predictive value of 97 to 99%.  At this time, this assay has not been approved by the FDA to exclude DVT/VTE. Results should be correlated with clinical presentation. Performed at [facility], [place] [address] [place], [place] [address]   Ferritin     Status: None   Collection Time: 02/22/19  1:30 PM  Result Value Ref Range   Ferritin 42 24 - 336 ng/mL    Comment: Performed at [place] [address], [place], [place] [address]  Magnesium     Status: None   Collection Time: 02/22/19  1:30 PM  Result Value Ref Range   Magnesium 1.7 1.7 - 2.4 mg/dL    Comment: Performed at [facility], [place] [address], [place], [place] [address]  Phosphorus     Status: None   Collection Time: 02/22/19  1:30 PM  Result Value Ref Range   Phosphorus 3.6 2.5 - 4.6 mg/dL    Comment: Performed at [facility], [place] [address], [place] [address]  CBC with Differential/Platelet     Status: None   Collection Time: 02/23/19  3:26 AM  Result Value Ref Range   WBC  6.9 4.0 - 10.5 K/uL   RBC 4.88 4.22 - 5.81 MIL/uL   Hemoglobin 14.2 13.0 - 17.0 g/dL   HCT 43.0 39.0 - 52.0 %   MCV 88.1 80.0 - 100.0 fL   MCH 29.1 26.0 - 34.0 pg   MCHC 33.0 30.0 - 36.0 g/dL   RDW 13.7 11.5 - 15.5 %   Platelets 259 150 - 400 K/uL   nRBC 0.0 0.0 - 0.2 %   Neutrophils Relative % 68 %   Neutro Abs 4.6 1.7 - 7.7 K/uL   Lymphocytes Relative 21 %   Lymphs Abs 1.4 0.7 - 4.0 K/uL   Monocytes Relative 8 %   Monocytes Absolute 0.6 0.1 - 1.0 K/uL   Eosinophils Relative 3 %   Eosinophils Absolute 0.2 0.0 - 0.5 K/uL   Basophils Relative 0 %  Basophils Absolute 0.0 0.0 - 0.1 K/uL   Immature Granulocytes 0 %   Abs Immature Granulocytes 0.01 0.00 - 0.07 K/uL    Comment: Performed at [facility], [address] [place], [place] [address]  Comprehensive metabolic panel     Status: Abnormal   Collection Time: 02/23/19  3:26 AM  Result Value Ref Range   Sodium 142 135 - 145 mmol/L   Potassium 4.2 3.5 - 5.1 mmol/L   Chloride 108 98 - 111 mmol/L   CO2 24 22 - 32 mmol/L   Glucose, Bld 105 (H) 70 - 99 mg/dL   BUN 7 6 - 20 mg/dL   Creatinine, Ser 2.99 0.61 - 1.24 mg/dL   Calcium 9.2 8.9 - 37.1 mg/dL   Total Protein 5.9 (L) 6.5 - 8.1 g/dL   Albumin 3.4 (L) 3.5 - 5.0 g/dL   AST 34 15 - 41 U/L   ALT 141 (H) 0 - 44 U/L   Alkaline Phosphatase 47 38 - 126 U/L   Total Bilirubin 0.3 0.3 - 1.2 mg/dL   GFR calc non Af Amer >60 >60 mL/min   GFR calc Af Amer >60 >60 mL/min   Anion gap 10 5 - 15    Comment: Performed at [facility], [address], [place], [place] [address]  C-reactive protein     Status: None   Collection Time: 02/23/19  3:26 AM  Result Value Ref Range   CRP <0.8 <1.0 mg/dL    Comment: Performed at [facility], [address], [place], [place] [address]  D-dimer, quantitative (not at [facility])     Status: Abnormal   Collection Time: 02/23/19  3:26 AM  Result Value Ref Range   D-Dimer, Quant 1.36 (H) 0.00 - 0.50 ug/mL-FEU    Comment: (NOTE) At  the manufacturer cut-off of 0.50 ug/mL FEU, this assay has been documented to exclude PE with a sensitivity and negative predictive value of 97 to 99%.  At this time, this assay has not been approved by the FDA to exclude DVT/VTE. Results should be correlated with clinical presentation. Performed at [facility], [address], [place], [place] [address]   Ferritin     Status: None   Collection Time: 02/23/19  3:26 AM  Result Value Ref Range   Ferritin 39 24 - 336 ng/mL    Comment: Performed at [facility], [address], [place], [place] [address]  Magnesium     Status: Abnormal   Collection Time: 02/23/19  3:26 AM  Result Value Ref Range   Magnesium 1.5 (L) 1.7 - 2.4 mg/dL    Comment: Performed at [facility], [address], [place], [place] [address]  Phosphorus     Status: None   Collection Time: 02/23/19  3:26 AM  Result Value Ref Range   Phosphorus 2.8 2.5 - 4.6 mg/dL    Comment: Performed at [facility], [address] [place], [place] [address]  Hepatitis panel, acute     Status: Abnormal   Collection Time: 02/23/19  3:26 AM  Result Value Ref Range   Hepatitis B Surface Ag NON REACTIVE NON REACTIVE   HCV Ab Reactive (A) NON REACTIVE    Comment: (NOTE) The CDC recommends that a Reactive HCV antibody result be followed up  with a HCV Nucleic Acid Amplification test.    Hep A IgM NON REACTIVE NON REACTIVE   Hep B C IgM NON  REACTIVE NON REACTIVE    Comment: Performed at [facility], [address] [place], [place] [address]  C-reactive protein     Status: None   Collection Time: 02/24/19  2:20 AM  Result Value Ref Range   CRP <0.8 <1.0 mg/dL    Comment: Performed at [facility], [address] [place], [place] [address]  CBC with Differential/Platelet     Status: None   Collection Time: 02/24/19  2:20 AM  Result Value Ref Range   WBC 7.0 4.0 - 10.5 K/uL   RBC 5.17 4.22 - 5.81 MIL/uL   Hemoglobin 14.7 13.0 -  17.0 g/dL   HCT 47.844.5 29.539.0 - 62.152.0 %   MCV 86.1 80.0 - 100.0 fL   MCH 28.4 26.0 - 34.0 pg   MCHC 33.0 30.0 - 36.0 g/dL   RDW 30.813.6 65.711.5 - 84.615.5 %   Platelets 248 150 - 400 K/uL   nRBC 0.0 0.0 - 0.2 %   Neutrophils Relative % 70 %   Neutro Abs 4.9 1.7 - 7.7 K/uL   Lymphocytes Relative 19 %   Lymphs Abs 1.3 0.7 - 4.0 K/uL   Monocytes Relative 7 %   Monocytes Absolute 0.5 0.1 - 1.0 K/uL   Eosinophils Relative 3 %   Eosinophils Absolute 0.2 0.0 - 0.5 K/uL   Basophils Relative 1 %   Basophils Absolute 0.0 0.0 - 0.1 K/uL   Immature Granulocytes 0 %   Abs Immature Granulocytes 0.01 0.00 - 0.07 K/uL    Comment: Performed at [facility], [address] [place], [place] [address]  Comprehensive metabolic panel     Status: Abnormal   Collection Time: 02/24/19  2:20 AM  Result Value Ref Range   Sodium 138 135 - 145 mmol/L   Potassium 4.2 3.5 - 5.1 mmol/L   Chloride 106 98 - 111 mmol/L   CO2 24 22 - 32 mmol/L   Glucose, Bld 105 (H) 70 - 99 mg/dL   BUN 11 6 - 20 mg/dL   Creatinine, Ser 2.840.85 0.61 - 1.24 mg/dL   Calcium 9.3 8.9 - 13.210.3 mg/dL   Total Protein 6.8 6.5 - 8.1 g/dL   Albumin 3.8 3.5 - 5.0 g/dL   AST 31 15 - 41 U/L   ALT 121 (H) 0 - 44 U/L   Alkaline Phosphatase 52 38 - 126 U/L   Total Bilirubin 0.4 0.3 - 1.2 mg/dL   GFR calc non Af Amer >60 >60 mL/min   GFR calc Af Amer >60 >60 mL/min   Anion gap 8 5 - 15    Comment: Performed at [facility], [address] [place], [place] [address]  D-dimer, quantitative (not at [facility])     Status: Abnormal   Collection Time: 02/24/19  2:20 AM  Result Value Ref Range   D-Dimer, Quant 0.65 (H) 0.00 - 0.50 ug/mL-FEU    Comment: (NOTE) At the manufacturer cut-off of 0.50 ug/mL FEU, this assay has been documented to exclude PE with a sensitivity and negative predictive value of 97 to 99%.  At this time, this assay has not been approved by the FDA to exclude DVT/VTE. Results should be correlated with  clinical presentation. Performed at [facility], [address] [place], [place] [address]   Ferritin     Status: None   Collection Time: 02/24/19  2:20 AM  Result Value Ref Range   Ferritin 43 24 - 336 ng/mL  Comment: Performed at [facility], [address], [place], [place] [address]  Phosphorus     Status: None   Collection Time: 02/24/19  2:20 AM  Result Value Ref Range   Phosphorus 2.5 2.5 - 4.6 mg/dL    Comment: Performed at [facility], [address], [place], [place] [address]  Magnesium     Status: Abnormal   Collection Time: 02/24/19  2:20 AM  Result Value Ref Range   Magnesium 1.6 (L) 1.7 - 2.4 mg/dL    Comment: Performed at [facility], [address] [place], [place] [address]    Medications:  Current Facility-Administered Medications  Medication Dose Route Frequency Provider Last Rate Last Dose  . 0.9 %  sodium chloride infusion   Intravenous Continuous Madelyn Flavors A, MD 75 mL/hr at 02/22/19 2011    . acetaminophen (TYLENOL) tablet 650 mg  650 mg Oral Q6H PRN Smith, Rondell A, MD      . albuterol (VENTOLIN HFA) 108 (90 Base) MCG/ACT inhaler 2 puff  2 puff Inhalation Q6H PRN Smith, Rondell A, MD      . amLODipine (NORVASC) tablet 10 mg  10 mg Oral Daily Smith, Rondell A, MD   10 mg at 02/22/19 1012  . aspirin EC tablet 81 mg  81 mg Oral Daily Smith, Rondell A, MD   81 mg at 02/22/19 1014  . cloNIDine (CATAPRES) tablet 0.1 mg  0.1 mg Oral Q6H PRN Leroy Sea, MD      . clopidogrel (PLAVIX) tablet 75 mg  75 mg Oral Daily Katrinka Blazing, Rondell A, MD   75 mg at 02/22/19 1015  . dicyclomine (BENTYL) tablet 20 mg  20 mg Oral PRN Madelyn Flavors A, MD   20 mg at 02/22/19 1745  . enoxaparin (LOVENOX) injection 60 mg  60 mg Subcutaneous Daily Rumbarger, Faye Ramsay, RPH      . folic acid (FOLVITE) tablet 1 mg  1 mg Oral Daily Smith, Rondell A, MD   1 mg at 02/22/19 1351  . gabapentin (NEURONTIN)  capsule 800 mg  800 mg Oral TID Madelyn Flavors A, MD   800 mg at 02/23/19 2111  . guaiFENesin-dextromethorphan (ROBITUSSIN DM) 100-10 MG/5ML syrup 10 mL  10 mL Oral Q4H PRN Smith, Rondell A, MD      . haloperidol lactate (HALDOL) injection 5 mg  5 mg Intramuscular STAT Leroy Sea, MD      . HYDROcodone-acetaminophen (NORCO/VICODIN) 5-325 MG per tablet 1 tablet  1 tablet Oral Q6H PRN Clydie Braun, MD   1 tablet at 02/23/19 2335  . lisinopril (ZESTRIL) tablet 40 mg  40 mg Oral Daily Smith, Rondell A, MD   40 mg at 02/22/19 1016  . magnesium oxide (MAG-OX) tablet 800 mg  800 mg Oral BID Leroy Sea, MD      . Melatonin TABS 6 mg  6 mg Oral QHS PRN Madelyn Flavors A, MD      . multivitamin with minerals tablet 1 tablet  1 tablet Oral Daily Madelyn Flavors A, MD   1 tablet at 02/22/19 1351  . [START ON 02/25/2019] OLANZapine (ZYPREXA) tablet 10 mg  10 mg Oral BID Charm Rings, NP      . OLANZapine (ZYPREXA) tablet 5 mg  5 mg Oral BID Charm Rings, NP      . ondansetron [facility]) tablet 4 mg  4 mg Oral Q6H PRN Clydie Braun, MD   4 mg  at 02/22/19 1745   Or  . ondansetron (ZOFRAN) injection 4 mg  4 mg Intravenous Q6H PRN Smith, Rondell A, MD      . pantoprazole (PROTONIX) EC tablet 40 mg  40 mg Oral Daily Smith, Rondell A, MD   40 mg at 02/22/19 1016  . pravastatin (PRAVACHOL) tablet 20 mg  20 mg Oral Daily Smith, Rondell A, MD   20 mg at 02/22/19 1017  . sodium chloride flush (NS) 0.9 % injection 3 mL  3 mL Intravenous Q12H Smith, Rondell A, MD   3 mL at 02/23/19 2111  . thiamine (VITAMIN B-1) tablet 100 mg  100 mg Oral Daily Smith, Rondell A, MD   100 mg at 02/22/19 1011   Or  . thiamine (B-1) injection 100 mg  100 mg Intravenous Daily Katrinka Blazing, Rondell A, MD   100 mg at 02/20/19 1133  . vitamin C (ASCORBIC ACID) tablet 500 mg  500 mg Oral Daily Smith, Rondell A, MD   500 mg at 02/22/19 1350  . zinc sulfate capsule 220 mg  220 mg Oral Daily Madelyn Flavors A, MD   220 mg at 02/22/19  1352    Musculoskeletal: Strength & Muscle Tone: within normal limits Gait & Station: normal Patient leans: N/A  Psychiatric Specialty Exam: Physical Exam  Nursing note and vitals reviewed. Constitutional: He is oriented to person, place, and time.  Neurological: He is alert and oriented to person, place, and time.  Psychiatric: His speech is normal and behavior is normal. Judgment normal. His mood appears anxious. His affect is angry. Cognition and memory are normal. He exhibits a depressed mood. He expresses suicidal ideation. He expresses suicidal plans.    Review of Systems  Psychiatric/Behavioral: Positive for depression, substance abuse and suicidal ideas. The patient is nervous/anxious.   All other systems reviewed and are negative.   Blood pressure (!) 143/96, pulse 99, temperature 98 F (36.7 C), temperature source Oral, resp. rate 18, height  (1.702 m), weight 120.2 kg, SpO2 99 %.Body mass index is 41.5 kg/m.  General Appearance: NA  Eye Contact:  NA  Speech:  Clear and Coherent  Volume:  Normal  Mood:  Angry, anxious, depression  Affect:  NA  Thought Process:  Coherent  Orientation:  Full (Time, Place, and Person)  Thought Content:  WDL  Suicidal Thoughts:  Yes.  without intent/plan  Homicidal Thoughts:  No  Memory:  Immediate;   Fair Recent;   Fair Remote;   Fair  Judgement:  Fair  Insight:  Fair  Psychomotor Activity:  Normal  Concentration:  Concentration: Good and Attention Span: Good  Recall:  Good  Fund of Knowledge:  Good  Language:  Good  Akathisia:  No  Handed:  Right  AIMS (if indicated):     Assets:  Leisure Time Physical Health Resilience  ADL's:  Intact  Cognition:  WNL  Sleep:      58 year old male with history of polysubstance dependency and depression.  Admitted to the hospital for Covid and polysubstance disorder with suicidal ideations.  Today he will not neither confirm or deny suicidal intent, does deny homicidal ideations along  with hallucinations and withdrawal symptoms.  Treatment Plan Summary: Daily contact with patient to assess and evaluate symptoms and progress in treatment, Medication management and Plan stimulant induced mood disorder:  -Recommend reducing gabapentin 800 mg 3 times daily to 300 mg 3 times daily -Stop Ativan detox protocol as he has not been using benzos or alcohol -Recommend  Zyprexa 5 mg twice daily today and increase to 10 mg twice daily tomorrow -Admit to inpatient psychiatry after Covid clear  Disposition: Recommend psychiatric Inpatient admission when medically cleared.  This service was provided via telemedicine using a 2-way, interactive audio and video technology.  Names of all persons participating in this telemedicine service and their role in this encounter. Name: Nanine Means Role: PMHNP  Name: [name] Role: patient    Nanine Means, NP 02/24/2019 10:43 AM

## 2019-02-25 LAB — CBC WITH DIFFERENTIAL/PLATELET
Abs Immature Granulocytes: 0.01 10*3/uL (ref 0.00–0.07)
Basophils Absolute: 0 10*3/uL (ref 0.0–0.1)
Basophils Relative: 0 %
Eosinophils Absolute: 0.2 10*3/uL (ref 0.0–0.5)
Eosinophils Relative: 3 %
HCT: 50.2 % (ref 39.0–52.0)
Hemoglobin: 16.5 g/dL (ref 13.0–17.0)
Immature Granulocytes: 0 %
Lymphocytes Relative: 26 %
Lymphs Abs: 1.8 10*3/uL (ref 0.7–4.0)
MCH: 28.8 pg (ref 26.0–34.0)
MCHC: 32.9 g/dL (ref 30.0–36.0)
MCV: 87.6 fL (ref 80.0–100.0)
Monocytes Absolute: 0.6 10*3/uL (ref 0.1–1.0)
Monocytes Relative: 8 %
Neutro Abs: 4.3 10*3/uL (ref 1.7–7.7)
Neutrophils Relative %: 63 %
Platelets: 279 10*3/uL (ref 150–400)
RBC: 5.73 MIL/uL (ref 4.22–5.81)
RDW: 14.2 % (ref 11.5–15.5)
WBC: 6.9 10*3/uL (ref 4.0–10.5)
nRBC: 0 % (ref 0.0–0.2)

## 2019-02-25 LAB — COMPREHENSIVE METABOLIC PANEL
ALT: 112 U/L — ABNORMAL HIGH (ref 0–44)
AST: 37 U/L (ref 15–41)
Albumin: 4 g/dL (ref 3.5–5.0)
Alkaline Phosphatase: 52 U/L (ref 38–126)
Anion gap: 10 (ref 5–15)
BUN: 12 mg/dL (ref 6–20)
CO2: 26 mmol/L (ref 22–32)
Calcium: 9.4 mg/dL (ref 8.9–10.3)
Chloride: 104 mmol/L (ref 98–111)
Creatinine, Ser: 0.95 mg/dL (ref 0.61–1.24)
GFR calc Af Amer: >60 mL/min (ref >60)
GFR calc non Af Amer: >60 mL/min (ref >60)
Glucose, Bld: 72 mg/dL (ref 70–99)
Potassium: 4.7 mmol/L (ref 3.5–5.1)
Sodium: 140 mmol/L (ref 135–145)
Total Bilirubin: 0.5 mg/dL (ref 0.3–1.2)
Total Protein: 7.2 g/dL (ref 6.5–8.1)

## 2019-02-25 LAB — C-REACTIVE PROTEIN: CRP: <0.8 mg/dL (ref <1.0)

## 2019-02-25 LAB — MAGNESIUM: Magnesium: 1.9 mg/dL (ref 1.7–2.4)

## 2019-02-25 LAB — D-DIMER, QUANTITATIVE: D-Dimer, Quant: 1.33 ug/mL-FEU — ABNORMAL HIGH (ref 0.00–0.50)

## 2019-02-25 LAB — FERRITIN: Ferritin: 51 ng/mL (ref 24–336)

## 2019-02-25 LAB — PHOSPHORUS: Phosphorus: 3 mg/dL (ref 2.5–4.6)

## 2019-02-25 MED ORDER — GABAPENTIN 300 MG PO CAPS
300.0000 mg | ORAL_CAPSULE | Freq: Three times a day (TID) | ORAL | Status: DC
  Administered 2019-02-25 (×3): 300 mg via ORAL
  Filled 2019-02-25 (×3): qty 1

## 2019-02-25 MED ORDER — DOCUSATE SODIUM 100 MG PO CAPS
100.0000 mg | ORAL_CAPSULE | Freq: Two times a day (BID) | ORAL | Status: DC | PRN
  Administered 2019-02-25: 21:00:00 100 mg via ORAL
  Filled 2019-02-25: qty 1

## 2019-02-25 NOTE — Progress Notes (Signed)
Patient ID: [name], male   DOB: 1961-01-11, 58 y.o.   MRN: [id number]   Reassessment   Patient reassessed by psychiatry after presenting with suicidal and homicidal. He was transferred to [place] after testing positive for COVID. Per review of chart, patient has a history of cocaine and meth use although he minimize his substance abuse and states he does not believe he needs substance abuse treatment.  He does however continue to report he is suicidal with a plan to, " shoot myself in the mouth."  Pt denies A/v-hallucinations He denies HI. At this time, he is not able to contract for safety.    Disposition: Continue to recommend psychiatric Inpatient admission when medically cleared.

## 2019-02-25 NOTE — Progress Notes (Signed)
Contacted R. Shanon Brow MD via page asking if patient can have something to help move his bowels. Patient hasn't has a BM in 3 days and patient states that it is abnormal for him.

## 2019-02-25 NOTE — Progress Notes (Addendum)
PROGRESS NOTE                                                                                                                                                                                                             Patient Demographics:    [name], is a 58 y.o. male, DOB - 22-May-1960, ZOX:[id number]  Outpatient Primary MD for the patient is [facility]    LOS - 3  Admit date - 02/20/2019    Chief Complaint  Patient presents with  . Chest Pain  . Suicidal  . Homicidal       Brief Narrative   -  [name] is a 58 y.o. year old male with medical history significant for CAD, hypertension, hyperlipidemia, hepatitis C untreated, polysubstance abuse(cocaine, tobacco, alcohol), bipolar 1 disorder, depression anxiety,andGERD who presented on 02/20/2019 with burning substernal chest pain worse with breathing and movement and right upper quadrant abdominal pain with nausea vomiting and diarrhea.  Patient also presented with suicidal homicidal ideation, with previous history of suicide attempts and was found to have Covid without clinical signs or radiographic evidence for pneumonia, he was seen by psych and they recommended inpatient psych admission, however as he was Covid positive he was sent to [facility] for further care on [facility], ER on 02/23/2019.    Subjective:    [name] today in bed calm but will not answer my questions turns his face around, does not want me to examine him.   Assessment  & Plan :      1. Homicidal and suicidal ideation in a patient with history of depression, anxiety, bipolar disorder along with substance abuse.  Has been seen already by psych at [facility], ER few days ago and they recommended inpatient psych.  Will be seen by psych daily medications adjusted as recommended by psych, we await inpatient psych bed availability for placement, he is currently under IVC.   2.  Acute Covid 19 Viral infection -incidental finding.  He is stable from the standpoint will be monitored.  COVID-19 Labs  Recent Labs    02/23/19 0326 02/24/19 0220 02/25/19 0340  DDIMER 1.36* 0.65* 1.33*  FERRITIN 39 43 51  CRP <0.8 <0.8 <0.8    Lab Results  Component Value Date   SARSCOV2NAA POSITIVE (A) 02/20/2019   [place] NEGATIVE 12/24/2018  SpO2: 97 %  Hepatic Function Latest Ref Rng & Units 02/25/2019 02/24/2019 02/23/2019  Total Protein 6.5 - 8.1 g/dL 7.2 6.8 5.9(L)  Albumin 3.5 - 5.0 g/dL 4.0 3.8 3.4(L)  AST 15 - 41 U/L 37 31 34  ALT 0 - 44 U/L 112(H) 121(H) 141(H)  Alk Phosphatase 38 - 126 U/L 52 52 47  Total Bilirubin 0.3 - 1.2 mg/dL 0.5 0.4 0.3  Bilirubin, Direct 0.0 - 0.2 mg/dL - - -     3.  Mild transaminitis.  Trend is improving could be due to COVID-19 infection will monitor.  He also has history of alcohol abuse in the past however this looks like an isolated ALT rise.  4.  Acute on chronic epigastric abdominal pain, stable CT scan.  Now resolved.  On PPI.  5.  Atypical chest pain with history of CAD.  EKG was checked at [facility] x2 this admission and was unremarkable with negative troponin x3.  Continue dual antiplatelet medication which he uses at baseline.  Ongoing cocaine use no beta-blockers.  6.  Polysubstance abuse.  Urine drug screen positive for amphetamines, cocaine and marijuana.  Told to quit.  Social work and psych consulted.  Also uses alcohol.  Counseled to quit.  Currently no signs of DTs will monitor.  On CIWA protocol.  7.  Hypertension.  Blood pressure stable on Norvasc, lisinopril and Cardizem combination.  Will stop atenolol due to ongoing cocaine use.  Catapres as needed added.   8.  Alcohol abuse.  Per patient last alcohol several weeks ago.  No signs of DTs.  9. Positive Hep C AB - Outpt GI follow up.  10. Hypomagnesemia - replaced and stable.    Condition - Fair  Family Communication  :  None  Code  Status :  Full  Diet :   Diet Orders (From admission, onward)    Start     Ordered   02/20/19 1110  Diet regular Room service appropriate? Yes; Fluid consistency: Thin  Diet effective now    Question Answer Comment  Room service appropriate? Yes   Fluid consistency: Thin      02/20/19 1112           Disposition Plan  :  Inpt Psych  Consults  :  Psych and S Work  Procedures  :     PUD Prophylaxis : PPI  DVT Prophylaxis  :  Lovenox    Lab Results  Component Value Date   PLT 279 02/25/2019    Inpatient Medications  Scheduled Meds: . amLODipine  10 mg Oral Daily  . aspirin EC  81 mg Oral Daily  . clopidogrel  75 mg Oral Daily  . enoxaparin (LOVENOX) injection  60 mg Subcutaneous Daily  . folic acid  1 mg Oral Daily  . gabapentin  300 mg Oral TID  . lisinopril  40 mg Oral Daily  . multivitamin with minerals  1 tablet Oral Daily  . OLANZapine  10 mg Oral BID  . pantoprazole  40 mg Oral Daily  . pravastatin  20 mg Oral Daily  . sodium chloride flush  3 mL Intravenous Q12H  . thiamine  100 mg Oral Daily  . vitamin C  500 mg Oral Daily  . zinc sulfate  220 mg Oral Daily   Continuous Infusions:  PRN Meds:.acetaminophen, albuterol, cloNIDine, dicyclomine, guaiFENesin-dextromethorphan, HYDROcodone-acetaminophen, Melatonin, ondansetron **OR** ondansetron (ZOFRAN) IV  Antibiotics  :    Anti-infectives (From admission, onward)   None  Time Spent in minutes  30   Lala Lund M.D on 02/25/2019 at 12:09 PM  To page go to [email or web address] - password [facility]  [facility] -  Office  [phone]    See all Orders from today for further details    Objective:   Vitals:   02/24/19 2036 02/25/19 0000 02/25/19 0338 02/25/19 0800  BP: 123/71  (!) 122/91 115/84  Pulse: 74 77 75 81  Resp: _0 Temp: 97.7 F (36.5 C)  (!) 97.5 F (36.4 C) 98.3 F (36.8 C)  TempSrc: Oral  Axillary Oral  SpO2: 95%  100% 97%  Weight:      Height:        Wt  Readings from Last 3 Encounters:  02/23/19 120.2 kg  12/26/18 111.4 kg  08/07/16 111.1 kg     Intake/Output Summary (Last 24 hours) at 02/25/2019 1209 Last data filed at 02/25/2019 0930 Gross per 24 hour  Intake 240 ml  Output -  Net 240 ml     Physical Exam  Awake, more calm than yesterday, will not answer my questions today. [place].AT,PERRAL Supple Neck,No JVD, No cervical lymphadenopathy appriciated.  Symmetrical Chest wall movement, Good air movement bilaterally, CTAB RRR,No Gallops, Rubs or new Murmurs, No Parasternal Heave +ve B.Sounds, Abd Soft, No tenderness, No organomegaly appriciated, No rebound - guarding or rigidity. No Cyanosis, Clubbing or edema, No new Rash or bruise    Data Review:    CBC Recent Labs  Lab 02/20/19 0444 02/22/19 1330 02/23/19 0326 02/24/19 0220 02/25/19 0340  WBC 8.3 7.3 6.9 7.0 6.9  HGB 15.9 15.0 14.2 14.7 16.5  HCT 46.3 44.0 43.0 44.5 50.2  PLT 288 243 259 248 279  MCV 84.3 84.5 88.1 86.1 87.6  MCH 29.0 28.8 29.1 28.4 28.8  MCHC 34.3 34.1 33.0 33.0 32.9  RDW 13.7 13.6 13.7 13.6 14.2  LYMPHSABS  --  1.7 1.4 1.3 1.8  MONOABS  --  0.5 0.6 0.5 0.6  EOSABS  --  0.3 0.2 0.2 0.2  BASOSABS  --  0.0 0.0 0.0 0.0    Chemistries  Recent Labs  Lab 02/20/19 0444 02/20/19 0753 02/22/19 1330 02/23/19 0326 02/24/19 0220 02/25/19 0340  NA 139  --  139 142 138 140  K 3.8  --  3.2* 4.2 4.2 4.7  CL 106  --  105 108 106 104  CO2 23  --  _1 GLUCOSE 108*  --  115* 105* 105* 72  BUN <5*  --  _2 CREATININE 1.00  --  0.89 1.05 0.85 0.95  CALCIUM 9.3  --  8.9 9.2 9.3 9.4  MG  --   --  1.7 1.5* 1.6* 1.9  AST  --  80* 44* 34 31 37  ALT  --  274* 172* 141* 121* 112*  ALKPHOS  --  54 51 47 52 52  BILITOT  --  0.5 0.5 0.3 0.4 0.5   ------------------------------------------------------------------------------------------------------------------ No results for input(s): CHOL, HDL, LDLCALC, TRIG, CHOLHDL, LDLDIRECT in the last  72 hours.  Lab Results  Component Value Date   HGBA1C 5.8 (H) 11/25/2015   ------------------------------------------------------------------------------------------------------------------ No results for input(s): TSH, T4TOTAL, T3FREE, THYROIDAB in the last 72 hours.  Invalid input(s): FREET3  Cardiac Enzymes No results for input(s): CKMB, TROPONINI, MYOGLOBIN in the last 168 hours.  Invalid input(s): CK ------------------------------------------------------------------------------------------------------------------ No results found for: BNP  Micro Results Recent Results (from the past 240 hour(s))  SARS CORONAVIRUS 2 (TAT 6-24 HRS) Nasopharyngeal Nasopharyngeal Swab     Status: Abnormal   Collection Time: 02/20/19  7:31 AM   Specimen: Nasopharyngeal Swab  Result Value Ref Range Status   SARS Coronavirus 2 POSITIVE (A) NEGATIVE Final    Comment: RESULT CALLED TO, READ BACK BY AND VERIFIED WITH: R HARDY,RN 1635 02/20/2019 D BRADLEY (NOTE) SARS-CoV-2 target nucleic acids are DETECTED. The SARS-CoV-2 RNA is generally detectable in upper and lower respiratory specimens during the acute phase of infection. Positive results are indicative of active infection with SARS-CoV-2. Clinical  correlation with patient history and other diagnostic information is necessary to determine patient infection status. Positive results do  not rule out bacterial infection or co-infection with other viruses. The expected result is Negative. Fact Sheet for Patients: SugarRoll.be Fact Sheet for Healthcare Providers: https://www.woods-mathews.com/ This test is not yet approved or cleared by the [place] FDA and  has been authorized for detection and/or diagnosis of SARS-CoV-2 by FDA under an Emergency Use Authorization (EUA). This EUA will remain  in effect (meaning this test can be used) for  the duration of the COVID-19 declaration under Section  564(b)(1) of the Act, 21 U.S.C. section 360bbb-3(b)(1), unless the authorization is terminated or revoked sooner. Performed at [facility], [place] [address], [place], [place] [address]     Radiology Reports Dg Chest 2 View  Result Date: 02/20/2019 CLINICAL DATA:  Chest pain. EXAM: CHEST - 2 VIEW COMPARISON:  CT 12/24/2018.  Chest x-ray 12/24/2018. FINDINGS: Mediastinum hilar structures normal. Heart size normal. Coronary artery calcification noted. Low lung volumes with mild bibasilar atelectasis again noted. No pleural effusion or pneumothorax. No acute bony abnormality identified. IMPRESSION: 1. Coronary artery calcification noted consistent coronary artery disease. Heart size normal. 2. Low lung volumes with mild bibasilar atelectasis again noted. No interim change. Chest is stable from prior exam. Electronically Signed   By: Marcello Moores  Register   On: 02/20/2019 06:07   Ct Abdomen Pelvis W Contrast  Result Date: 02/20/2019 CLINICAL DATA:  Right lower quadrant pain and diarrhea since yesterday morning. EXAM: CT ABDOMEN AND PELVIS WITH CONTRAST TECHNIQUE: Multidetector CT imaging of the abdomen and pelvis was performed using the standard protocol following bolus administration of intravenous contrast. CONTRAST:  130m OMNIPAQUE IOHEXOL 300 MG/ML  SOLN COMPARISON:  12/24/2018 FINDINGS: Lower chest: No acute abnormality. Hepatobiliary: No focal liver abnormality is seen. No gallstones, gallbladder wall thickening, or biliary dilatation. Pancreas: Unremarkable. No pancreatic ductal dilatation or surrounding inflammatory changes. Spleen: Normal in size without focal abnormality. Adrenals/Urinary Tract: No adrenal masses. Kidneys are normal in size, orientation and position with symmetric enhancement and excretion. 17 mm cyst, midpole the right kidney, stable. No other renal masses, no stones and no hydronephrosis. Normal ureters. Normal bladder. Stomach/Bowel: Stomach is unremarkable. Small bowel and  colon are normal in caliber. No wall thickening. No inflammation. There are several small left colon diverticula without diverticulitis. Normal appendix visualized. Vascular/Lymphatic: Mild aortic atherosclerosis. No aneurysm or other vascular abnormality. No enlarged lymph nodes. Reproductive: Unremarkable. Other: No abdominal wall hernia or abnormality. No abdominopelvic ascites. Musculoskeletal: No fracture or acute finding. No osteoblastic or osteolytic lesions. IMPRESSION: 1. No acute findings. No findings to account for the patient's pain. No bowel inflammation. Normal appendix visualized. 2. Mild aortic atherosclerosis. Electronically Signed   By: DLajean ManesM.D.   On: 02/20/2019 10:18

## 2019-02-26 DIAGNOSIS — F3173 Bipolar disorder, in partial remission, most recent episode manic: Secondary | ICD-10-CM

## 2019-02-26 MED ORDER — GABAPENTIN 300 MG PO CAPS
300.0000 mg | ORAL_CAPSULE | Freq: Three times a day (TID) | ORAL | Status: DC
  Administered 2019-02-26 – 2019-03-04 (×18): 300 mg via ORAL
  Filled 2019-02-26 (×20): qty 1

## 2019-02-26 NOTE — Progress Notes (Signed)
[facility] PROGRESS NOTE    Progress Note  COHL WYSINGER  LDJ:[id number] DOB: [birth year] DOA: 02/20/2019 PCP: [facility]     Brief Narrative:   [name] is an 58 y.o. male past medical history significant for CAD, essential hypertension, history of suicidal attempts, hepatitis C, polysubstance abuse, bipolar disorder, depression who presents to the ED on 02/20/2019 with substernal chest pain worse with breathing and movement, right upper quadrant abdominal pain nausea vomiting and diarrhea.  He also related some homicidal ideation.  He was found to be COVID-19 by PCR, chest x-ray showed bilateral infiltrates.  Was seen by psych and recommended inpatient psych admission, but due to his COVID-19 positivity he was transferred to [facility] in 02/23/2019.  Assessment/Plan:   Homicidal and Suicidal ideation He was seen at [facility] long hospital by psych and recommended inpatient psychiatric admission. Psychiatry is following here at [facility] and adjusting medication. He is awaiting inpatient psych availability, he is currently IVC.  Acute COVID-19 viral infection: Incidentally continue to monitor. Sats have remained stable. He had to be isolated for 14 days since his initial PCR was positive.  Mild transaminitis: Possibly due to COVID-19 and/or alcohol abuse.. There is slowly trending down, ALT is the that is slightly elevated.  Acute on chronic epigastric abdominal pain: Now resolved continue PPI.  Atypical chest pain with history of CAD: EKG was checked at [facility] long hospital this admission and remained unremarkable, cardiac biomarkers were negative. Continue dual antiplatelet therapy. With an ongoing cocaine use.  Polysubstance abuse: UDS was positive for amphetamine cocaine and marijuana. No signs of DTs.  Essential hypertension: Continue Norvasc lisinopril and Cardizem. Atenolol has been stopped due to ongoing cocaine abuse.  Alcohol abuse: No signs of  withdrawal.  Hepatitis C positivity: We will need to follow-up with GI as an outpatient.  Hypomagnesemia: Replete orally now stable.    DVT prophylaxis: lovenox Family Communication:none Disposition Plan/Barrier to D/C: Psyq psych facilities 2 weeks after initial PCR. Code Status:     Code Status Orders  (From admission, onward)         Start     Ordered   02/22/19 1107  Full code  Continuous     02/22/19 1113        Code Status History    Date Active Date Inactive Code Status Order ID Comments User Context   12/24/2018 1538 12/26/2018 2053 DNR [id number]  Claudean Severance, MD ED   12/24/2018 1533 12/24/2018 1538 Full Code [id number]  Claudean Severance, MD ED   05/25/2016 0303 05/27/2016 1834 Full Code [id number]  Duayne Cal, NP ED   05/18/2016 2105 05/23/2016 1701 Full Code [id number]  Long, Arlyss Repress, MD ED   03/18/2016 1425 03/20/2016 1525 Full Code [id number]  Lorre Nick, MD ED   11/25/2015 1308 11/29/2015 2059 DNR [id number]  Lora Paula, MD ED   10/18/2015 1224 10/20/2015 1341 Full Code [id number]  Leta Baptist, MD ED   01/28/2015 1426 02/01/2015 1730 Full Code [id number]  Talmage Nap, NP Inpatient   01/27/2015 0731 01/28/2015 1426 Full Code [id number]  Melene Plan, DO ED   03/26/2014 2059 04/06/2014 1657 Full Code [id number]  Nanine Means, NP Inpatient   03/23/2014 1720 03/26/2014 2048 Full Code [id number]  Annett Gula Inpatient   03/22/2013 1210 03/23/2013 1251 Full Code [id number]  Ky Barban, MD Inpatient   10/17/2012 1749 10/18/2012 0416 Full Code [id number]  Roxy Horseman, PA-C ED   07/17/2012 0043 07/17/2012  1751 Full Code [id number]  Gari Crown ED   05/16/2012 2316 05/17/2012 1330 Full Code [id number]  Janice Norrie, MD ED   03/20/2012 1027 03/20/2012 1931 Full Code [id number]  Katy Apo, MD ED   02/23/2012 1829 02/25/2012 1452 Full Code [id number]  Arbutus Leas, RN Inpatient   Advance Care Planning Activity    Advance Directive  Documentation     Most Recent Value  Type of Advance Directive  Healthcare Power of Attorney  Pre-existing out of facility DNR order (yellow form or pink MOST form)  -  "MOST" Form in Place?  -        IV Access:    Peripheral IV   Procedures and diagnostic studies:   No results found.   Medical Consultants:    None.  Anti-Infectives:   None  Subjective:    Standley Dakins no complains  Objective:    Vitals:   02/25/19 1559 02/25/19 1915 02/26/19 0408 02/26/19 0800  BP: 115/65 121/69 (!) 117/99 117/88  Pulse:  78 96 67  Resp: 18 18 20 19   Temp:  97.6 F (36.4 C) 97.9 F (36.6 C) 98 F (36.7 C)  TempSrc:  Oral Axillary Oral  SpO2:  98% 99% 95%  Weight:      Height:       SpO2: 95 %   Intake/Output Summary (Last 24 hours) at 02/26/2019 0832 Last data filed at 02/25/2019 1915 Gross per 24 hour  Intake 1520 ml  Output -  Net 1520 ml   Filed Weights   02/20/19 0438 02/23/19 1954  Weight: 120.2 kg 120.2 kg    Exam: General exam: In no acute distress. Respiratory system: Good air movement and clear to auscultation. Cardiovascular system: S1 & S2 heard, RRR. No JVD. Gastrointestinal system: Abdomen is nondistended, soft and nontender.  Central nervous system: Alert and oriented. No focal neurological deficits. Extremities: No pedal edema. Skin: No rashes, lesions or ulcers Psychiatry: Judgement and insight appear normal. Mood & affect appropriate.    Data Reviewed:    Labs: Basic Metabolic Panel: Recent Labs  Lab 02/20/19 0444 02/22/19 1330 02/23/19 0326 02/24/19 0220 02/25/19 0340  NA 139 139 142 138 140  K 3.8 3.2* 4.2 4.2 4.7  CL 106 105 108 106 104  CO2 23 22 24 24 26   GLUCOSE 108* 115* 105* 105* 72  BUN <5* 6 7 11 12   CREATININE 1.00 0.89 1.05 0.85 0.95  CALCIUM 9.3 8.9 9.2 9.3 9.4  MG  --  1.7 1.5* 1.6* 1.9  PHOS  --  3.6 2.8 2.5 3.0   GFR Estimated Creatinine Clearance: 105.1 mL/min (by C-G formula based on SCr of 0.95  mg/dL). Liver Function Tests: Recent Labs  Lab 02/20/19 0753 02/22/19 1330 02/23/19 0326 02/24/19 0220 02/25/19 0340  AST 80* 44* 34 31 37  ALT 274* 172* 141* 121* 112*  ALKPHOS 54 51 47 52 52  BILITOT 0.5 0.5 0.3 0.4 0.5  PROT 6.9 6.0* 5.9* 6.8 7.2  ALBUMIN 3.8 3.5 3.4* 3.8 4.0   Recent Labs  Lab 02/20/19 0753  LIPASE 76*   No results for input(s): AMMONIA in the last 168 hours. Coagulation profile No results for input(s): INR, PROTIME in the last 168 hours. COVID-19 Labs  Recent Labs    02/24/19 0220 02/25/19 0340  DDIMER 0.65* 1.33*  FERRITIN 43 51  CRP <0.8 <0.8    Lab Results  Component Value Date   SARSCOV2NAA POSITIVE (A) 02/20/2019  SARSCOV2NAA NEGATIVE 12/24/2018    CBC: Recent Labs  Lab 02/20/19 0444 02/22/19 1330 02/23/19 0326 02/24/19 0220 02/25/19 0340  WBC 8.3 7.3 6.9 7.0 6.9  NEUTROABS  --  4.7 4.6 4.9 4.3  HGB 15.9 15.0 14.2 14.7 16.5  HCT 46.3 44.0 43.0 44.5 50.2  MCV 84.3 84.5 88.1 86.1 87.6  PLT 288 243 259 248 279   Cardiac Enzymes: No results for input(s): CKTOTAL, CKMB, CKMBINDEX, TROPONINI in the last 168 hours. BNP (last 3 results) No results for input(s): PROBNP in the last 8760 hours. CBG: No results for input(s): GLUCAP in the last 168 hours. D-Dimer: Recent Labs    02/24/19 0220 02/25/19 0340  DDIMER 0.65* 1.33*   Hgb A1c: No results for input(s): HGBA1C in the last 72 hours. Lipid Profile: No results for input(s): CHOL, HDL, LDLCALC, TRIG, CHOLHDL, LDLDIRECT in the last 72 hours. Thyroid function studies: No results for input(s): TSH, T4TOTAL, T3FREE, THYROIDAB in the last 72 hours.  Invalid input(s): FREET3 Anemia work up: Recent Labs    02/24/19 0220 02/25/19 0340  FERRITIN 43 51   Sepsis Labs: Recent Labs  Lab 02/22/19 1330 02/23/19 0326 02/24/19 0220 02/25/19 0340  WBC 7.3 6.9 7.0 6.9   Microbiology Recent Results (from the past 240 hour(s))  SARS CORONAVIRUS 2 (TAT 6-24 HRS)  Nasopharyngeal Nasopharyngeal Swab     Status: Abnormal   Collection Time: 02/20/19  7:31 AM   Specimen: Nasopharyngeal Swab  Result Value Ref Range Status   SARS Coronavirus 2 POSITIVE (A) NEGATIVE Final    Comment: RESULT CALLED TO, READ BACK BY AND VERIFIED WITH: R HARDY,RN 1635 02/20/2019 D BRADLEY (NOTE) SARS-CoV-2 target nucleic acids are DETECTED. The SARS-CoV-2 RNA is generally detectable in upper and lower respiratory specimens during the acute phase of infection. Positive results are indicative of active infection with SARS-CoV-2. Clinical  correlation with patient history and other diagnostic information is necessary to determine patient infection status. Positive results do  not rule out bacterial infection or co-infection with other viruses. The expected result is Negative. Fact Sheet for Patients: HairSlick.no Fact Sheet for Healthcare Providers: quierodirigir.com This test is not yet approved or cleared by the [place] FDA and  has been authorized for detection and/or diagnosis of SARS-CoV-2 by FDA under an Emergency Use Authorization (EUA). This EUA will remain  in effect (meaning this test can be used) for  the duration of the COVID-19 declaration under Section 564(b)(1) of the Act, 21 U.S.C. section 360bbb-3(b)(1), unless the authorization is terminated or revoked sooner. Performed at [facility], [address], [place], [place] [address]      Medications:   . amLODipine  10 mg Oral Daily  . aspirin EC  81 mg Oral Daily  . clopidogrel  75 mg Oral Daily  . enoxaparin (LOVENOX) injection  60 mg Subcutaneous Daily  . folic acid  1 mg Oral Daily  . gabapentin  300 mg Oral TID  . lisinopril  40 mg Oral Daily  . multivitamin with minerals  1 tablet Oral Daily  . OLANZapine  10 mg Oral BID  . pantoprazole  40 mg Oral Daily  . pravastatin  20 mg Oral Daily  . sodium chloride flush  3 mL  Intravenous Q12H  . thiamine  100 mg Oral Daily  . vitamin C  500 mg Oral Daily  . zinc sulfate  220 mg Oral Daily   Continuous Infusions:    LOS: 4 days   Darin Engels  [name] Stall  [facility]  02/26/2019, 8:32 AM

## 2019-02-27 NOTE — Progress Notes (Signed)
[facility] PROGRESS NOTE    Progress Note  [name]  IRJ:[id number] DOB: 05-Feb-1961 DOA: 02/20/2019 PCP: [facility]     Brief Narrative:   [name] is an 58 y.o. male past medical history significant for CAD, essential hypertension, history of suicidal attempts, hepatitis C, polysubstance abuse, bipolar disorder, depression who presents to the ED on 02/20/2019 with substernal chest pain worse with breathing and movement, right upper quadrant abdominal pain nausea vomiting and diarrhea.  He also related some homicidal ideation.  He was found to be COVID-19 by PCR, chest x-ray showed bilateral infiltrates.  Was seen by psych and recommended inpatient psych admission, but due to his COVID-19 positivity he was transferred to [facility] in 02/23/2019.  Assessment/Plan:   Homicidal and Suicidal ideation He was seen to [facility] by psych and recommended inpatient psychiatric admission. Psychiatry is following here at [facility] and adjusting medication. His initial test was on 02/20/2019 he needs to be 14 days in isolation, before proceeding to inpatient psychiatric rehab.  Acute COVID-19 viral infection: Incidentally continue to monitor. Sats have remained stable. He had to be isolated for 14 days since his initial PCR was positive.  Mild transaminitis: Possibly due to COVID-19 and/or alcohol abuse.. There is slowly trending down, ALT is the that is slightly elevated.  Acute on chronic epigastric abdominal pain: Now resolved continue PPI.  Atypical chest pain with history of CAD: EKG was checked at [facility] this admission and remained unremarkable, cardiac biomarkers were negative. Continue dual antiplatelet therapy. With an ongoing cocaine use.  Polysubstance abuse: UDS was positive for amphetamine cocaine and marijuana. No signs of DTs.  Essential hypertension: Continue Norvasc lisinopril and Cardizem. Atenolol has been stopped due to  ongoing cocaine abuse.  Alcohol abuse: No signs of withdrawal.  Hepatitis C positivity: We will need to follow-up with GI as an outpatient.  Hypomagnesemia: Replete orally now stable.    DVT prophylaxis: lovenox Family Communication:none Disposition Plan/Barrier to D/C: Psyq psych facilities 2 weeks after initial PCR. Code Status:     Code Status Orders  (From admission, onward)         Start     Ordered   02/22/19 1107  Full code  Continuous     02/22/19 1113        Code Status History    Date Active Date Inactive Code Status Order ID Comments User Context   12/24/2018 1538 12/26/2018 2053 DNR [id number]  Claudean Severance, MD ED   12/24/2018 1533 12/24/2018 1538 Full Code [id number]  Claudean Severance, MD ED   05/25/2016 0303 05/27/2016 1834 Full Code [id number]  Duayne Cal, NP ED   05/18/2016 2105 05/23/2016 1701 Full Code [id number]  Long, Arlyss Repress, MD ED   03/18/2016 1425 03/20/2016 1525 Full Code [id number]  Lorre Nick, MD ED   11/25/2015 1308 11/29/2015 2059 DNR [id number]  Lora Paula, MD ED   10/18/2015 1224 10/20/2015 1341 Full Code [id number]  Leta Baptist, MD ED   01/28/2015 1426 02/01/2015 1730 Full Code [id number]  Talmage Nap, NP Inpatient   01/27/2015 0731 01/28/2015 1426 Full Code [id number]  Melene Plan, DO ED   03/26/2014 2059 04/06/2014 1657 Full Code [id number]  Nanine Means, NP Inpatient   03/23/2014 1720 03/26/2014 2048 Full Code [id number]  Annett Gula Inpatient   03/22/2013 1210 03/23/2013 1251 Full Code [id number]  Ky Barban, MD Inpatient   10/17/2012 1749 10/18/2012 0416 Full Code [id number]  Roxy Horseman, PA-C ED   07/17/2012 0043 07/17/2012 0637 Full Code [id number]  Gilmer Mor ED   05/16/2012 2316 05/17/2012 1330 Full Code [id number]  Ward Givens, MD ED   03/20/2012 1027 03/20/2012 1931 Full Code [id number]  Osvaldo Human, MD ED   02/23/2012 1829 02/25/2012 1452 Full Code [id number]  Little Ishikawa, RN Inpatient    Advance Care Planning Activity    Advance Directive Documentation     Most Recent Value  Type of Advance Directive  Healthcare Power of Attorney  Pre-existing out of facility DNR order (yellow form or pink MOST form)  -  "MOST" Form in Place?  -        IV Access:    Peripheral IV   Procedures and diagnostic studies:   No results found.   Medical Consultants:    None.  Anti-Infectives:   None  Subjective:    [name] no complains  Objective:    Vitals:   02/26/19 1750 02/26/19 2000 02/27/19 0309 02/27/19 0746  BP:  96/65 105/85 (!) 107/91  Pulse: (!) 154 100 89 87  Resp:  18 16   Temp:  98.4 F (36.9 C) 98.2 F (36.8 C) 98.3 F (36.8 C)  TempSrc:  Oral Oral Oral  SpO2: 95% 93% 92% 93%  Weight:      Height:       SpO2: 93 %   Intake/Output Summary (Last 24 hours) at 02/27/2019 0954 Last data filed at 02/27/2019 0916 Gross per 24 hour  Intake 1040 ml  Output -  Net 1040 ml   Filed Weights   02/20/19 0438 02/23/19 1954  Weight: 120.2 kg 120.2 kg    Exam: General exam: In no acute distress. Respiratory system: Good air movement and clear to auscultation. Cardiovascular system: S1 & S2 heard, RRR. No JVD. Gastrointestinal system: Abdomen is nondistended, soft and nontender.  Central nervous system: Alert and oriented. No focal neurological deficits. Extremities: No pedal edema. Skin: No rashes, lesions or ulcers Psychiatry: Judgement and insight appear normal. Mood & affect appropriate.    Data Reviewed:    Labs: Basic Metabolic Panel: Recent Labs  Lab 02/22/19 1330 02/23/19 0326 02/24/19 0220 02/25/19 0340  NA 139 142 138 140  K 3.2* 4.2 4.2 4.7  CL 105 108 106 104  CO2 22 24 24 26   GLUCOSE 115* 105* 105* 72  BUN 6 7 11 12   CREATININE 0.89 1.05 0.85 0.95  CALCIUM 8.9 9.2 9.3 9.4  MG 1.7 1.5* 1.6* 1.9  PHOS 3.6 2.8 2.5 3.0   GFR Estimated Creatinine Clearance: 105.1 mL/min (by C-G formula based on SCr of 0.95  mg/dL). Liver Function Tests: Recent Labs  Lab 02/22/19 1330 02/23/19 0326 02/24/19 0220 02/25/19 0340  AST 44* 34 31 37  ALT 172* 141* 121* 112*  ALKPHOS 51 47 52 52  BILITOT 0.5 0.3 0.4 0.5  PROT 6.0* 5.9* 6.8 7.2  ALBUMIN 3.5 3.4* 3.8 4.0   No results for input(s): LIPASE, AMYLASE in the last 168 hours. No results for input(s): AMMONIA in the last 168 hours. Coagulation profile No results for input(s): INR, PROTIME in the last 168 hours. COVID-19 Labs  Recent Labs    02/25/19 0340  DDIMER 1.33*  FERRITIN 51  CRP <0.8    Lab Results  Component Value Date   SARSCOV2NAA POSITIVE (A) 02/20/2019   SARSCOV2NAA NEGATIVE 12/24/2018    CBC: Recent Labs  Lab 02/22/19 1330 02/23/19 0326 02/24/19 0220  02/25/19 0340  WBC 7.3 6.9 7.0 6.9  NEUTROABS 4.7 4.6 4.9 4.3  HGB 15.0 14.2 14.7 16.5  HCT 44.0 43.0 44.5 50.2  MCV 84.5 88.1 86.1 87.6  PLT 243 259 248 279   Cardiac Enzymes: No results for input(s): CKTOTAL, CKMB, CKMBINDEX, TROPONINI in the last 168 hours. BNP (last 3 results) No results for input(s): PROBNP in the last 8760 hours. CBG: No results for input(s): GLUCAP in the last 168 hours. D-Dimer: Recent Labs    02/25/19 0340  DDIMER 1.33*   Hgb A1c: No results for input(s): HGBA1C in the last 72 hours. Lipid Profile: No results for input(s): CHOL, HDL, LDLCALC, TRIG, CHOLHDL, LDLDIRECT in the last 72 hours. Thyroid function studies: No results for input(s): TSH, T4TOTAL, T3FREE, THYROIDAB in the last 72 hours.  Invalid input(s): FREET3 Anemia work up: Recent Labs    02/25/19 0340  FERRITIN 51   Sepsis Labs: Recent Labs  Lab 02/22/19 1330 02/23/19 0326 02/24/19 0220 02/25/19 0340  WBC 7.3 6.9 7.0 6.9   Microbiology Recent Results (from the past 240 hour(s))  SARS CORONAVIRUS 2 (TAT 6-24 HRS) Nasopharyngeal Nasopharyngeal Swab     Status: Abnormal   Collection Time: 02/20/19  7:31 AM   Specimen: Nasopharyngeal Swab  Result Value Ref  Range Status   SARS Coronavirus 2 POSITIVE (A) NEGATIVE Final    Comment: RESULT CALLED TO, READ BACK BY AND VERIFIED WITH: R HARDY,RN 1635 02/20/2019 D BRADLEY (NOTE) SARS-CoV-2 target nucleic acids are DETECTED. The SARS-CoV-2 RNA is generally detectable in upper and lower respiratory specimens during the acute phase of infection. Positive results are indicative of active infection with SARS-CoV-2. Clinical  correlation with patient history and other diagnostic information is necessary to determine patient infection status. Positive results do  not rule out bacterial infection or co-infection with other viruses. The expected result is Negative. Fact Sheet for Patients: SugarRoll.be Fact Sheet for Healthcare Providers: https://www.woods-mathews.com/ This test is not yet approved or cleared by the [place] FDA and  has been authorized for detection and/or diagnosis of SARS-CoV-2 by FDA under an Emergency Use Authorization (EUA). This EUA will remain  in effect (meaning this test can be used) for  the duration of the COVID-19 declaration under Section 564(b)(1) of the Act, 21 U.S.C. section 360bbb-3(b)(1), unless the authorization is terminated or revoked sooner. Performed at [facility], [place] [address], [place], [place] [address]      Medications:   . amLODipine  10 mg Oral Daily  . aspirin EC  81 mg Oral Daily  . clopidogrel  75 mg Oral Daily  . enoxaparin (LOVENOX) injection  60 mg Subcutaneous Daily  . folic acid  1 mg Oral Daily  . gabapentin  300 mg Oral TID  . lisinopril  40 mg Oral Daily  . multivitamin with minerals  1 tablet Oral Daily  . OLANZapine  10 mg Oral BID  . pantoprazole  40 mg Oral Daily  . pravastatin  20 mg Oral Daily  . sodium chloride flush  3 mL Intravenous Q12H  . thiamine  100 mg Oral Daily  . vitamin C  500 mg Oral Daily  . zinc sulfate  220 mg Oral Daily   Continuous Infusions:    LOS:  5 days   Charlynne Cousins  [facility]  02/27/2019, 9:54 AM

## 2019-02-28 NOTE — Progress Notes (Signed)
[facility] PROGRESS NOTE    Progress Note  [name]  IRJ:[id number] DOB: 05-Feb-1961 DOA: 02/20/2019 PCP: [facility]     Brief Narrative:   [name] is an 58 y.o. male past medical history significant for CAD, essential hypertension, history of suicidal attempts, hepatitis C, polysubstance abuse, bipolar disorder, depression who presents to the ED on 02/20/2019 with substernal chest pain worse with breathing and movement, right upper quadrant abdominal pain nausea vomiting and diarrhea.  He also related some homicidal ideation.  He was found to be COVID-19 by PCR, chest x-ray showed bilateral infiltrates.  Was seen by psych and recommended inpatient psych admission, but due to his COVID-19 positivity he was transferred to [facility] in 02/23/2019.  Assessment/Plan:   Homicidal and Suicidal ideation He was seen to [facility] by psych and recommended inpatient psychiatric admission. Psychiatry is following here at [facility] and adjusting medication. His initial test was on 02/20/2019 he needs to be 14 days in isolation, before proceeding to inpatient psychiatric rehab.  Acute COVID-19 viral infection: Incidentally continue to monitor. Sats have remained stable. He had to be isolated for 14 days since his initial PCR was positive.  Mild transaminitis: Possibly due to COVID-19 and/or alcohol abuse.. There is slowly trending down, ALT is the that is slightly elevated.  Acute on chronic epigastric abdominal pain: Now resolved continue PPI.  Atypical chest pain with history of CAD: EKG was checked at [facility] this admission and remained unremarkable, cardiac biomarkers were negative. Continue dual antiplatelet therapy. With an ongoing cocaine use.  Polysubstance abuse: UDS was positive for amphetamine cocaine and marijuana. No signs of DTs.  Essential hypertension: Continue Norvasc lisinopril and Cardizem. Atenolol has been stopped due to  ongoing cocaine abuse.  Alcohol abuse: No signs of withdrawal.  Hepatitis C positivity: We will need to follow-up with GI as an outpatient.  Hypomagnesemia: Replete orally now stable.    DVT prophylaxis: lovenox Family Communication:none Disposition Plan/Barrier to D/C: Psyq psych facilities 2 weeks after initial PCR. Code Status:     Code Status Orders  (From admission, onward)         Start     Ordered   02/22/19 1107  Full code  Continuous     02/22/19 1113        Code Status History    Date Active Date Inactive Code Status Order ID Comments User Context   12/24/2018 1538 12/26/2018 2053 DNR [id number]  Claudean Severance, MD ED   12/24/2018 1533 12/24/2018 1538 Full Code [id number]  Claudean Severance, MD ED   05/25/2016 0303 05/27/2016 1834 Full Code [id number]  Duayne Cal, NP ED   05/18/2016 2105 05/23/2016 1701 Full Code [id number]  Long, Arlyss Repress, MD ED   03/18/2016 1425 03/20/2016 1525 Full Code [id number]  Lorre Nick, MD ED   11/25/2015 1308 11/29/2015 2059 DNR [id number]  Lora Paula, MD ED   10/18/2015 1224 10/20/2015 1341 Full Code [id number]  Leta Baptist, MD ED   01/28/2015 1426 02/01/2015 1730 Full Code [id number]  Talmage Nap, NP Inpatient   01/27/2015 0731 01/28/2015 1426 Full Code [id number]  Melene Plan, DO ED   03/26/2014 2059 04/06/2014 1657 Full Code [id number]  Nanine Means, NP Inpatient   03/23/2014 1720 03/26/2014 2048 Full Code [id number]  Annett Gula Inpatient   03/22/2013 1210 03/23/2013 1251 Full Code [id number]  Ky Barban, MD Inpatient   10/17/2012 1749 10/18/2012 0416 Full Code [id number]  Roxy Horseman, PA-C ED   07/17/2012 0043 07/17/2012 0637 Full Code [id number]  Gilmer Mor ED   05/16/2012 2316 05/17/2012 1330 Full Code [id number]  Ward Givens, MD ED   03/20/2012 1027 03/20/2012 1931 Full Code [id number]  Osvaldo Human, MD ED   02/23/2012 1829 02/25/2012 1452 Full Code [id number]  Little Ishikawa, RN Inpatient    Advance Care Planning Activity    Advance Directive Documentation     Most Recent Value  Type of Advance Directive  Healthcare Power of Attorney  Pre-existing out of facility DNR order (yellow form or pink MOST form)  -  "MOST" Form in Place?  -        IV Access:    Peripheral IV   Procedures and diagnostic studies:   No results found.   Medical Consultants:    None.  Anti-Infectives:   None  Subjective:    [name] no complains  Objective:    Vitals:   02/27/19 0746 02/27/19 1549 02/27/19 2056 02/28/19 0122  BP: (!) 107/91 94/70 111/67 134/87  Pulse: 87 (!) 101 95   Resp: 18 18    Temp: 98.3 F (36.8 C) 97.6 F (36.4 C) 98.4 F (36.9 C) 98 F (36.7 C)  TempSrc: Oral Oral Oral Oral  SpO2: 93% 99% 94%   Weight:      Height:       SpO2: 94 %   Intake/Output Summary (Last 24 hours) at 02/28/2019 0743 Last data filed at 02/27/2019 1809 Gross per 24 hour  Intake 1200 ml  Output -  Net 1200 ml   Filed Weights   02/20/19 0438 02/23/19 1954  Weight: 120.2 kg 120.2 kg    Exam: General exam: In no acute distress. Respiratory system: Good air movement and clear to auscultation. Cardiovascular system: S1 & S2 heard, RRR. No JVD. Gastrointestinal system: Abdomen is nondistended, soft and nontender.  Central nervous system: Alert and oriented. No focal neurological deficits. Extremities: No pedal edema. Skin: No rashes, lesions or ulcers Psychiatry: Judgement and insight appear normal. Mood & affect appropriate.    Data Reviewed:    Labs: Basic Metabolic Panel: Recent Labs  Lab 02/22/19 1330 02/23/19 0326 02/24/19 0220 02/25/19 0340  NA 139 142 138 140  K 3.2* 4.2 4.2 4.7  CL 105 108 106 104  CO2 22 24 24 26   GLUCOSE 115* 105* 105* 72  BUN 6 7 11 12   CREATININE 0.89 1.05 0.85 0.95  CALCIUM 8.9 9.2 9.3 9.4  MG 1.7 1.5* 1.6* 1.9  PHOS 3.6 2.8 2.5 3.0   GFR Estimated Creatinine Clearance: 105.1 mL/min (by C-G formula based on  SCr of 0.95 mg/dL). Liver Function Tests: Recent Labs  Lab 02/22/19 1330 02/23/19 0326 02/24/19 0220 02/25/19 0340  AST 44* 34 31 37  ALT 172* 141* 121* 112*  ALKPHOS 51 47 52 52  BILITOT 0.5 0.3 0.4 0.5  PROT 6.0* 5.9* 6.8 7.2  ALBUMIN 3.5 3.4* 3.8 4.0   No results for input(s): LIPASE, AMYLASE in the last 168 hours. No results for input(s): AMMONIA in the last 168 hours. Coagulation profile No results for input(s): INR, PROTIME in the last 168 hours. COVID-19 Labs  No results for input(s): DDIMER, FERRITIN, LDH, CRP in the last 72 hours.  Lab Results  Component Value Date   SARSCOV2NAA POSITIVE (A) 02/20/2019   SARSCOV2NAA NEGATIVE 12/24/2018    CBC: Recent Labs  Lab 02/22/19 1330 02/23/19 0326 02/24/19 0220 02/25/19 0340  WBC 7.3 6.9 7.0 6.9  NEUTROABS 4.7 4.6 4.9 4.3  HGB 15.0 14.2 14.7 16.5  HCT 44.0 43.0 44.5 50.2  MCV 84.5 88.1 86.1 87.6  PLT 243 259 248 279   Cardiac Enzymes: No results for input(s): CKTOTAL, CKMB, CKMBINDEX, TROPONINI in the last 168 hours. BNP (last 3 results) No results for input(s): PROBNP in the last 8760 hours. CBG: No results for input(s): GLUCAP in the last 168 hours. D-Dimer: No results for input(s): DDIMER in the last 72 hours. Hgb A1c: No results for input(s): HGBA1C in the last 72 hours. Lipid Profile: No results for input(s): CHOL, HDL, LDLCALC, TRIG, CHOLHDL, LDLDIRECT in the last 72 hours. Thyroid function studies: No results for input(s): TSH, T4TOTAL, T3FREE, THYROIDAB in the last 72 hours.  Invalid input(s): FREET3 Anemia work up: No results for input(s): VITAMINB12, FOLATE, FERRITIN, TIBC, IRON, RETICCTPCT in the last 72 hours. Sepsis Labs: Recent Labs  Lab 02/22/19 1330 02/23/19 0326 02/24/19 0220 02/25/19 0340  WBC 7.3 6.9 7.0 6.9   Microbiology Recent Results (from the past 240 hour(s))  SARS CORONAVIRUS 2 (TAT 6-24 HRS) Nasopharyngeal Nasopharyngeal Swab     Status: Abnormal   Collection Time:  02/20/19  7:31 AM   Specimen: Nasopharyngeal Swab  Result Value Ref Range Status   SARS Coronavirus 2 POSITIVE (A) NEGATIVE Final    Comment: RESULT CALLED TO, READ BACK BY AND VERIFIED WITH: R HARDY,RN 1635 02/20/2019 D BRADLEY (NOTE) SARS-CoV-2 target nucleic acids are DETECTED. The SARS-CoV-2 RNA is generally detectable in upper and lower respiratory specimens during the acute phase of infection. Positive results are indicative of active infection with SARS-CoV-2. Clinical  correlation with patient history and other diagnostic information is necessary to determine patient infection status. Positive results do  not rule out bacterial infection or co-infection with other viruses. The expected result is Negative. Fact Sheet for Patients: SugarRoll.be Fact Sheet for Healthcare Providers: https://www.woods-mathews.com/ This test is not yet approved or cleared by the [place] FDA and  has been authorized for detection and/or diagnosis of SARS-CoV-2 by FDA under an Emergency Use Authorization (EUA). This EUA will remain  in effect (meaning this test can be used) for  the duration of the COVID-19 declaration under Section 564(b)(1) of the Act, 21 U.S.C. section 360bbb-3(b)(1), unless the authorization is terminated or revoked sooner. Performed at [facility], [place] [address], [place], [place] [address]      Medications:   . amLODipine  10 mg Oral Daily  . aspirin EC  81 mg Oral Daily  . clopidogrel  75 mg Oral Daily  . enoxaparin (LOVENOX) injection  60 mg Subcutaneous Daily  . folic acid  1 mg Oral Daily  . gabapentin  300 mg Oral TID  . lisinopril  40 mg Oral Daily  . multivitamin with minerals  1 tablet Oral Daily  . OLANZapine  10 mg Oral BID  . pantoprazole  40 mg Oral Daily  . pravastatin  20 mg Oral Daily  . sodium chloride flush  3 mL Intravenous Q12H  . thiamine  100 mg Oral Daily  . vitamin C  500 mg Oral Daily   . zinc sulfate  220 mg Oral Daily   Continuous Infusions:    LOS: 6 days   Charlynne Cousins  [facility]  02/28/2019, 7:43 AM

## 2019-03-01 NOTE — Progress Notes (Signed)
[facility] PROGRESS NOTE    Progress Note  [name]  WUJ:[id number] DOB: 1960-09-10 DOA: 02/20/2019 PCP: [facility]     Brief Narrative:   [name] is an 58 y.o. male past medical history significant for CAD, essential hypertension, history of suicidal attempts, hepatitis C, polysubstance abuse, bipolar disorder, depression who presents to the ED on 02/20/2019 with substernal chest pain worse with breathing and movement, right upper quadrant abdominal pain nausea vomiting and diarrhea.  He also related some homicidal ideation.  He was found to be COVID-19 by PCR, chest x-ray showed bilateral infiltrates.  Was seen by psych and recommended inpatient psych admission, but due to his COVID-19 positivity he was transferred to [facility] in 02/23/2019.  Assessment/Plan:   Homicidal and Suicidal ideation He was evaluated by psychiatry was in the hospital was recommended inpatient psych admission after being cleared from a COVID-19 standpoint. His initial test for SARS-CoV-2 PCR was + on 02/20/2019, he will need a total of 14 days of isolation before proceeding to inpatient behavioral health.  Acute COVID-19 viral infection: Incidentally continue to monitor. Remained stable has remained afebrile.  Mild transaminitis: Possibly due to COVID-19 and/or alcohol abuse.. There is slowly trending down, ALT is the that is slightly elevated.  Acute on chronic epigastric abdominal pain: Now resolved continue PPI.  Atypical chest pain with history of CAD: EKG on admission was unremarkable, cardiac biomarkers remain negative. Continue with dual antiplatelet therapy. He has a history of ongoing cocaine abuse. He is currently not complaining of any chest pain. He currently has a degree of polysubstance abuse will be cautious on using narcotics. Continue Neurontin.  Polysubstance abuse: UDS was positive for amphetamine cocaine and marijuana. No signs of DTs.  Essential  hypertension: Continue Norvasc lisinopril and Cardizem. Atenolol has been stopped due to ongoing cocaine abuse.  Alcohol abuse: No signs of withdrawal.  Hepatitis C positivity: We will need to follow-up with GI as an outpatient.  Hypomagnesemia: Replete orally now stable.    DVT prophylaxis: lovenox Family Communication:none Disposition Plan/Barrier to D/C: Psyq psych facilities 2 weeks after initial PCR. Code Status:     Code Status Orders  (From admission, onward)         Start     Ordered   02/22/19 1107  Full code  Continuous     02/22/19 1113        Code Status History    Date Active Date Inactive Code Status Order ID Comments User Context   12/24/2018 1538 12/26/2018 2053 DNR [id number]  Asencion Noble, MD ED   12/24/2018 1533 12/24/2018 1538 Full Code [id number]  Asencion Noble, MD ED   05/25/2016 0303 05/27/2016 1834 Full Code [id number]  Corey Harold, NP ED   05/18/2016 2105 05/23/2016 1701 Full Code [id number]  Long, Wonda Olds, MD ED   03/18/2016 1425 03/20/2016 1525 Full Code [id number]  Lacretia Leigh, MD ED   11/25/2015 1308 11/29/2015 2059 DNR [id number]  Milagros Loll, MD ED   10/18/2015 1224 10/20/2015 1341 Full Code [id number]  Harvel Quale, MD ED   01/28/2015 1426 02/01/2015 1730 Full Code [id number]  Consuello Closs, NP Inpatient   01/27/2015 0731 01/28/2015 1426 Full Code [id number]  Deno Etienne, DO ED   03/26/2014 2059 04/06/2014 1657 Full Code [id number]  Waylan Boga, NP Inpatient   03/23/2014 1720 03/26/2014 2048 Full Code [id number]  Cresenciano Genre Inpatient   03/22/2013 1210 03/23/2013 1251 Full Code [id number]  Hayes Ludwig,  Winona Legato, MD Inpatient   10/17/2012 1749 10/18/2012 0416 Full Code [id number]  Felipa Furnace ED   07/17/2012 0043 07/17/2012 0637 Full Code [id number]  Jeannetta Ellis, PA-C ED   05/16/2012 2316 05/17/2012 1330 Full Code [id number]  Ward Givens, MD ED   03/20/2012 1027 03/20/2012 1931 Full Code [id number]  Osvaldo Human, MD  ED   02/23/2012 1829 02/25/2012 1452 Full Code [id number]  Little Ishikawa, RN Inpatient   Advance Care Planning Activity    Advance Directive Documentation     Most Recent Value  Type of Advance Directive  Healthcare Power of Attorney  Pre-existing out of facility DNR order (yellow form or pink MOST form)  -  "MOST" Form in Place?  -        IV Access:    Peripheral IV   Procedures and diagnostic studies:   No results found.   Medical Consultants:    None.  Anti-Infectives:   None  Subjective:    [name] no complains  Objective:    Vitals:   02/28/19 1003 02/28/19 1706 02/28/19 1957 03/01/19 0400  BP: 132/87 (!) 140/104 107/80 (!) 127/95  Pulse: (!) 131 (!) 124 97 100  Resp:  16 16   Temp: 97.6 F (36.4 C)  97.7 F (36.5 C) 97.7 F (36.5 C)  TempSrc: Axillary Oral Oral Tympanic  SpO2: 97% 99% 99% 98%  Weight:      Height:       SpO2: 98 %   Intake/Output Summary (Last 24 hours) at 03/01/2019 0743 Last data filed at 03/01/2019 0630 Gross per 24 hour  Intake 720 ml  Output -  Net 720 ml   Filed Weights   02/20/19 0438 02/23/19 1954  Weight: 120.2 kg 120.2 kg    Exam: General exam: In no acute distress. Respiratory system: Good air movement and clear to auscultation. Cardiovascular system: S1 & S2 heard, RRR. No JVD. Gastrointestinal system: Abdomen is nondistended, soft and nontender.  Central nervous system: Alert and oriented. No focal neurological deficits. Extremities: No pedal edema. Skin: No rashes, lesions or ulcers Psychiatry: Judgement and insight appear normal. Mood & affect appropriate.    Data Reviewed:    Labs: Basic Metabolic Panel: Recent Labs  Lab 02/22/19 1330 02/23/19 0326 02/24/19 0220 02/25/19 0340  NA 139 142 138 140  K 3.2* 4.2 4.2 4.7  CL 105 108 106 104  CO2 22 24 24 26   GLUCOSE 115* 105* 105* 72  BUN 6 7 11 12   CREATININE 0.89 1.05 0.85 0.95  CALCIUM 8.9 9.2 9.3 9.4  MG 1.7 1.5* 1.6* 1.9   PHOS 3.6 2.8 2.5 3.0   GFR Estimated Creatinine Clearance: 105.1 mL/min (by C-G formula based on SCr of 0.95 mg/dL). Liver Function Tests: Recent Labs  Lab 02/22/19 1330 02/23/19 0326 02/24/19 0220 02/25/19 0340  AST 44* 34 31 37  ALT 172* 141* 121* 112*  ALKPHOS 51 47 52 52  BILITOT 0.5 0.3 0.4 0.5  PROT 6.0* 5.9* 6.8 7.2  ALBUMIN 3.5 3.4* 3.8 4.0   No results for input(s): LIPASE, AMYLASE in the last 168 hours. No results for input(s): AMMONIA in the last 168 hours. Coagulation profile No results for input(s): INR, PROTIME in the last 168 hours. COVID-19 Labs  No results for input(s): DDIMER, FERRITIN, LDH, CRP in the last 72 hours.  Lab Results  Component Value Date   SARSCOV2NAA POSITIVE (A) 02/20/2019   SARSCOV2NAA NEGATIVE 12/24/2018  CBC: Recent Labs  Lab 02/22/19 1330 02/23/19 0326 02/24/19 0220 02/25/19 0340  WBC 7.3 6.9 7.0 6.9  NEUTROABS 4.7 4.6 4.9 4.3  HGB 15.0 14.2 14.7 16.5  HCT 44.0 43.0 44.5 50.2  MCV 84.5 88.1 86.1 87.6  PLT 243 259 248 279   Cardiac Enzymes: No results for input(s): CKTOTAL, CKMB, CKMBINDEX, TROPONINI in the last 168 hours. BNP (last 3 results) No results for input(s): PROBNP in the last 8760 hours. CBG: No results for input(s): GLUCAP in the last 168 hours. D-Dimer: No results for input(s): DDIMER in the last 72 hours. Hgb A1c: No results for input(s): HGBA1C in the last 72 hours. Lipid Profile: No results for input(s): CHOL, HDL, LDLCALC, TRIG, CHOLHDL, LDLDIRECT in the last 72 hours. Thyroid function studies: No results for input(s): TSH, T4TOTAL, T3FREE, THYROIDAB in the last 72 hours.  Invalid input(s): FREET3 Anemia work up: No results for input(s): VITAMINB12, FOLATE, FERRITIN, TIBC, IRON, RETICCTPCT in the last 72 hours. Sepsis Labs: Recent Labs  Lab 02/22/19 1330 02/23/19 0326 02/24/19 0220 02/25/19 0340  WBC 7.3 6.9 7.0 6.9   Microbiology Recent Results (from the past 240 hour(s))  SARS  CORONAVIRUS 2 (TAT 6-24 HRS) Nasopharyngeal Nasopharyngeal Swab     Status: Abnormal   Collection Time: 02/20/19  7:31 AM   Specimen: Nasopharyngeal Swab  Result Value Ref Range Status   SARS Coronavirus 2 POSITIVE (A) NEGATIVE Final    Comment: RESULT CALLED TO, READ BACK BY AND VERIFIED WITH: R HARDY,RN 1635 02/20/2019 D BRADLEY (NOTE) SARS-CoV-2 target nucleic acids are DETECTED. The SARS-CoV-2 RNA is generally detectable in upper and lower respiratory specimens during the acute phase of infection. Positive results are indicative of active infection with SARS-CoV-2. Clinical  correlation with patient history and other diagnostic information is necessary to determine patient infection status. Positive results do  not rule out bacterial infection or co-infection with other viruses. The expected result is Negative. Fact Sheet for Patients: HairSlick.no Fact Sheet for Healthcare Providers: quierodirigir.com This test is not yet approved or cleared by the [place] FDA and  has been authorized for detection and/or diagnosis of SARS-CoV-2 by FDA under an Emergency Use Authorization (EUA). This EUA will remain  in effect (meaning this test can be used) for  the duration of the COVID-19 declaration under Section 564(b)(1) of the Act, 21 U.S.C. section 360bbb-3(b)(1), unless the authorization is terminated or revoked sooner. Performed at [facility], [address], [place], [place] [address]      Medications:   . amLODipine  10 mg Oral Daily  . aspirin EC  81 mg Oral Daily  . clopidogrel  75 mg Oral Daily  . enoxaparin (LOVENOX) injection  60 mg Subcutaneous Daily  . folic acid  1 mg Oral Daily  . gabapentin  300 mg Oral TID  . lisinopril  40 mg Oral Daily  . multivitamin with minerals  1 tablet Oral Daily  . OLANZapine  10 mg Oral BID  . pantoprazole  40 mg Oral Daily  . pravastatin  20 mg Oral Daily  . sodium  chloride flush  3 mL Intravenous Q12H  . thiamine  100 mg Oral Daily  . vitamin C  500 mg Oral Daily  . zinc sulfate  220 mg Oral Daily   Continuous Infusions:    LOS: 7 days   Marinda Elk  [facility]  03/01/2019, 7:43 AM

## 2019-03-02 DIAGNOSIS — R45851 Suicidal ideations: Secondary | ICD-10-CM

## 2019-03-02 MED ORDER — HALOPERIDOL 5 MG PO TABS
5.0000 mg | ORAL_TABLET | Freq: Four times a day (QID) | ORAL | Status: DC | PRN
  Administered 2019-03-02 (×2): 5 mg via ORAL
  Filled 2019-03-02 (×3): qty 1

## 2019-03-02 MED ORDER — HALOPERIDOL LACTATE 5 MG/ML IJ SOLN: 5.0000 mg | Freq: Four times a day (QID) | INTRAMUSCULAR | Status: DC | PRN

## 2019-03-02 MED ORDER — DICYCLOMINE HCL 20 MG PO TABS
20.0000 mg | ORAL_TABLET | Freq: Four times a day (QID) | ORAL | Status: DC | PRN
  Filled 2019-03-02: qty 1

## 2019-03-02 NOTE — Progress Notes (Addendum)
[facility] MD Progress Note  03/02/2019 10:04 AM KENSON GROH  MRN:  [id number]   Subjective:  [name] reported " I am not doing well."  Objective: Daley was evaluated via teleassessment.  He presents flat, guarded but pleasant.  Reports ongoing "bad thoughts."  Reported his main stressors or currently related to his  pain and physical health.  Denying suicidal or homicidal ideations.  Denies auditory or visual hallucinations.  Patient was continued on Zyprexa 10 mg BID and gabapentin 300 mg.  As he reports he does not feel his medications is helping his mood.  Kase reported " I just feel down all the time."  Reported he was followed by the [place].  Reported history of PTSD, bipolar disorder and depression.  Reports previous attempts to walk into traffic.  Reports he is currently on disability.  Reports his mood has gotten worse in the past month however did not elaborate on what triggered his thoughts and feelings.  Case staffed with attending psychiatrist MD Clary   Consider initiating Wellbutrin 75 mg- short acting.  Continue to monitor for safety.  Support, encouragement and  reassurance was provided.  HPI per chart:  [name] a 58 y.o.malewho voluntarily came to [facility] with SI/HI with a plan.Patient states, "I can't stop crying and thinking about people who have hurt me. I'm thinking about taking my gun killing him and then shooting myself." Pt refuse to give the name of the individual. Pt reports having a history of suicide attempts. Pt states, "I tried to kill myself when my daughter died." Pt admits to relapsing 4 days ago after 2 years of sobriety. Pt states, "I had alcohol and cocaine 2 days ago" but did not disclosure the amount. Pt reports having running thoughts and minimal sleep. Pt denies A/v-hallucinations.   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 Alcohol abuse   . Anxiety   . Arthritis    "all over" (11/25/2015)  . Bipolar 1 disorder ([place])   . Bipolar affective disorder ([place])   . Chronic back pain   . Chronic kidney disease   . Cocaine abuse ([place])    smokes crack-cocaine  . Depression   . GERD (gastroesophageal reflux disease)   . Gout   . Headache    "q couple days; stress, tension, anger" (11/25/2015)  . Hemorrhoids   . Hepatitis C    "not yet treated" (11/25/2015)  . Hypertension   . Insomnia   . Myocardial infarction [facility]) 11/2015   "supposedly" (11/25/2015)  . OSA on CPAP    "had mask; it was stolen" (11/25/2015)  . PTSD (post-traumatic stress disorder)   . Tobacco abuse   . Vitamin D deficiency     Past Surgical History:  Procedure Laterality Date  . CARDIAC CATHETERIZATION N/A 11/29/2015   Procedure: Left Heart  Cath and Coronary Angiography;  Surgeon: Laurey Morale, MD;  Location: [facility] INVASIVE CV LAB;  Service: Cardiovascular;  Laterality: N/A;  . FRACTURE SURGERY    . PATELLA FRACTURE SURGERY Left ~ 1995  . TONSILLECTOMY     Family History:  Family History  Problem Relation Age of Onset  . Hypertension Brother   . Diabetes Brother   . Hypertension Mother   . Diabetes Mother   . Arthritis Mother   . Other Sister        bone disease   Family Psychiatric  History:  Social History:  Social History   Substance and Sexual Activity  Alcohol Use Yes   Comment:  11/25/2015 "quit alcohol 6 months ago; used to drink from morning til night"     Social History   Substance and Sexual Activity  Drug Use Yes  . Types: Marijuana, "Crack" cocaine, Cocaine   Comment: states that he uses marijuana and cocaine every now and then     Social History   Socioeconomic History  . Marital status: Single    Spouse name: Not on file  . Number of children: Not on file  . Years of education: Not on file  . Highest education level: Not on file  Occupational History  . Not on file  Social Needs  . Financial resource strain: Not on file  . Food insecurity    Worry: Not on file    Inability: Not on file  . Transportation needs    Medical: Not on file    Non-medical: Not on file  Tobacco Use  . Smoking status: Current Every Day Smoker    Packs/day: 0.12    Years: 46.00    Pack years: 5.52  . Smokeless tobacco: Never Used  Substance and Sexual Activity  . Alcohol use: Yes    Comment: 11/25/2015 "quit alcohol 6 months ago; used to drink from morning til night"  . Drug use: Yes    Types: Marijuana, "Crack" cocaine, Cocaine    Comment: states that he uses marijuana and cocaine every now and then   . Sexual activity: Not Currently  Lifestyle  . Physical activity    Days per week: Not on file    Minutes per session: Not on file  . Stress: Not on file  Relationships  . Social Musician on phone: Not on file    Gets together: Not on file    Attends religious service: Not on file    Active member of club or organization: Not on file    Attends meetings of clubs or organizations: Not on file    Relationship status: Not on file  Other Topics Concern  . Not on file  Social History Narrative   ** Merged History Encounter **       Lives alone in [place], [place]   Additional Social History:    Pain Medications: See MARs Prescriptions: See MARs Over the Counter: See MARs History of alcohol / drug use?: Yes Longest period of sobriety (when/how  long): 2 years clean Negative Consequences of Use: Personal relationships, Financial Withdrawal Symptoms: Agitation, Aggressive/Assaultive, Irritability Name of Substance 1: Alcohol 1 - Age of First Use: unknown 1 - Amount (size/oz): unknown 1 - Frequency: unknown 1 - Duration: ongoing 1 - Last Use / Amount: 2 days ago Name of Substance 2: Cocaine 2 - Age of First Use: unknown 2 - Amount (size/oz): unknown 2 - Frequency: unknown 2 - Duration: ongoing 2 -  Last Use / Amount: 2 days ago                Sleep: NA  Appetite:  NA  Current Medications: Current Facility-Administered Medications  Medication Dose Route Frequency Provider Last Rate Last Dose  . acetaminophen (TYLENOL) tablet 650 mg  650 mg Oral Q6H PRN Smith, Rondell A, MD      . albuterol (VENTOLIN HFA) 108 (90 Base) MCG/ACT inhaler 2 puff  2 puff Inhalation Q6H PRN Smith, Rondell A, MD      . amLODipine (NORVASC) tablet 10 mg  10 mg Oral Daily Katrinka Blazing, Rondell A, MD   10 mg at 03/01/19 1023  . aspirin EC tablet 81 mg  81 mg Oral Daily Smith, Rondell A, MD   81 mg at 03/01/19 1024  . cloNIDine (CATAPRES) tablet 0.1 mg  0.1 mg Oral Q6H PRN Leroy Sea, MD      . clopidogrel (PLAVIX) tablet 75 mg  75 mg Oral Daily Katrinka Blazing, Rondell A, MD   75 mg at 03/01/19 1023  . dicyclomine (BENTYL) tablet 20 mg  20 mg Oral PRN Madelyn Flavors A, MD   20 mg at 02/28/19 0206  . docusate sodium (COLACE) capsule 100 mg  100 mg Oral BID PRN Haydee Monica, MD   100 mg at 02/25/19 2041  . enoxaparin (LOVENOX) injection 60 mg  60 mg Subcutaneous Daily Rumbarger, Faye Ramsay, RPH   60 mg at 03/01/19 1025  . folic acid (FOLVITE) tablet 1 mg  1 mg Oral Daily Katrinka Blazing, Rondell A, MD   1 mg at 03/01/19 1023  . gabapentin (NEURONTIN) capsule 300 mg  300 mg Oral TID Marinda Elk, MD   300 mg at 03/01/19 2206  . guaiFENesin-dextromethorphan (ROBITUSSIN DM) 100-10 MG/5ML syrup 10 mL  10 mL Oral Q4H PRN Madelyn Flavors A, MD      .  HYDROcodone-acetaminophen (NORCO/VICODIN) 5-325 MG per tablet 1 tablet  1 tablet Oral Q6H PRN Clydie Braun, MD   1 tablet at 02/27/19 1627  . lisinopril (ZESTRIL) tablet 40 mg  40 mg Oral Daily Katrinka Blazing, Rondell A, MD   40 mg at 03/01/19 1024  . Melatonin TABS 6 mg  6 mg Oral QHS PRN Madelyn Flavors A, MD      . multivitamin with minerals tablet 1 tablet  1 tablet Oral Daily Madelyn Flavors A, MD   1 tablet at 03/01/19 1023  . OLANZapine (ZYPREXA) tablet 10 mg  10 mg Oral BID Charm Rings, NP   10 mg at 03/01/19 2206  . ondansetron (ZOFRAN) tablet 4 mg  4 mg Oral Q6H PRN Madelyn Flavors A, MD   4 mg at 02/22/19 1745   Or  . ondansetron (ZOFRAN) injection 4 mg  4 mg Intravenous Q6H PRN Smith, Rondell A, MD      . pantoprazole (PROTONIX) EC tablet 40 mg  40 mg Oral Daily Smith, Rondell A, MD   40 mg at 03/01/19 1023  . pravastatin (PRAVACHOL) tablet 20 mg  20 mg Oral Daily Katrinka Blazing, Rondell A, MD   20 mg at 03/01/19 1024  . sodium chloride flush (NS) 0.9 % injection 3 mL  3 mL Intravenous Q12H Smith, Rondell A, MD   3 mL at 02/28/19 2235  . thiamine (VITAMIN B-1) tablet 100 mg  100 mg Oral Daily Katrinka Blazing, Rondell A, MD   100 mg at 03/01/19 1025  . vitamin C (ASCORBIC ACID) tablet 500 mg  500 mg Oral  Daily Madelyn Flavors A, MD   500 mg at 03/01/19 1024  . zinc sulfate capsule 220 mg  220 mg Oral Daily Smith, Rondell A, MD   220 mg at 03/01/19 1023    Lab Results: No results found for this or any previous visit (from the past 48 hour(s)).  Blood Alcohol level:  Lab Results  Component Value Date   ETH <10 12/24/2018   ETH <5 05/24/2016    Metabolic Disorder Labs: Lab Results  Component Value Date   HGBA1C 5.8 (H) 11/25/2015   MPG 120 11/25/2015   MPG 137 (H) 03/23/2014   No results found for: PROLACTIN Lab Results  Component Value Date   CHOL 130 11/26/2015   TRIG 84 11/26/2015   HDL 33 (L) 11/26/2015   CHOLHDL 3.9 11/26/2015   VLDL 17 11/26/2015   LDLCALC 80 11/26/2015   LDLCALC 97  03/23/2014    Physical Findings: AIMS:  , ,  ,  ,    CIWA:  CIWA-Ar Total: 0 COWS:     Musculoskeletal:   Psychiatric Specialty Exam: Physical Exam  Vitals reviewed. Constitutional: He appears well-developed.  Psychiatric: He has a normal mood and affect. His behavior is normal.    Review of Systems  Psychiatric/Behavioral: Positive for depression. The patient is nervous/anxious.   All other systems reviewed and are negative.   Blood pressure 119/76, pulse (!) 108, temperature 97.9 F (36.6 C), temperature source Oral, resp. rate 17, height 5\' 7"  (1.702 m), weight 120.2 kg, SpO2 97 %.Body mass index is 41.5 kg/m.  General Appearance: Casual and Guarded  Eye Contact:  Good  Speech:  Clear and Coherent  Volume:  Normal  Mood:  Anxious and Depressed  Affect:  Congruent  Thought Process:  Linear  Orientation:  Full (Time, Place, and Person)  Thought Content:  Hallucinations: None  Suicidal Thoughts:  No reported " bad thoughts"   Homicidal Thoughts:  No  Memory:  Immediate;   Fair Recent;   Fair  Judgement:  Fair  Insight:  Fair  Psychomotor Activity:  Normal  Concentration:  Concentration: Fair  Recall:  [employer] of Knowledge:  Fair  Language:  Fair  Akathisia:  No  Handed:  Right  AIMS (if indicated):     Assets:  Communication Skills Desire for Improvement Resilience Social Support  ADL's:  Intact  Cognition:  WNL  Sleep:        Treatment Plan Summary: Daily contact with patient to assess and evaluate symptoms and progress in treatment and Medication management   Continue with currrent treatment plan on 03/02/2019 as listed below except where noted   Plan stimulant induced mood disorder:  -Cotninue gabapentin 300 mg 3 times daily -Stop Ativan detox protocol as he has not been using benzos or alcohol - Continue Zyprexa  10 mg twice daily  - Consider initiating Wellbutrin 75 mg daily short acting   CSW to continue working on discharge disposition   [facility]    Oneta Rack, NP 03/02/2019, 10:04 AM

## 2019-03-02 NOTE — Progress Notes (Signed)
Patient has been calm and cooperative with care during the entire shift. No noted  signs of DT'S, no threats to himself or others

## 2019-03-02 NOTE — Progress Notes (Signed)
[facility] PROGRESS NOTE    Progress Note  [name]  JEH:[id number] DOB: December 25, 1960 DOA: 02/20/2019 PCP: [facility]     Brief Narrative:   [name] is an 58 y.o. male past medical history significant for CAD, essential hypertension, history of suicidal attempts, hepatitis C, polysubstance abuse, bipolar disorder, depression who presents to the ED on 02/20/2019 with substernal chest pain worse with breathing and movement, right upper quadrant abdominal pain nausea vomiting and diarrhea.  He also related some homicidal ideation.  He was found to be COVID-19 by PCR, chest x-ray showed bilateral infiltrates.  Was seen by psych and recommended inpatient psych admission, but due to his COVID-19 positivity he was transferred to [facility] in 02/23/2019.  Assessment/Plan:   Homicidal and Suicidal ideation He was evaluated by psychiatry was in the hospital was recommended inpatient psych admission after being cleared from a COVID-19 standpoint. His initial test for SARS-CoV-2 PCR was + on 02/20/2019, he will need a total of 14 days in isolation.  Acute COVID-19 viral infection: Incidentally continue to monitor. Remained stable has remained afebrile.  Mild transaminitis: Possibly due to COVID-19 and/or alcohol abuse.. There is slowly trending down, ALT is the that is slightly elevated.  Acute on chronic epigastric abdominal pain: Now resolved continue PPI.  Atypical chest pain with history of CAD: EKG and cardiac markers were unremarkable. Continue with dual antiplatelet therapy. He had no further chest pain.  Polysubstance abuse: UDS was positive for amphetamine cocaine and marijuana. No signs of DTs.  Essential hypertension: Continue Norvasc lisinopril and Cardizem. Atenolol has been stopped due to ongoing cocaine abuse.  Alcohol abuse: No signs of withdrawal.  Hepatitis C positivity: We will need to follow-up with GI as an outpatient.  Hypomagnesemia:  Replete orally now stable.    DVT prophylaxis: lovenox Family Communication:none Disposition Plan/Barrier to D/C: Once cleared by psych. Code Status:     Code Status Orders  (From admission, onward)         Start     Ordered   02/22/19 1107  Full code  Continuous     02/22/19 1113        Code Status History    Date Active Date Inactive Code Status Order ID Comments User Context   12/24/2018 1538 12/26/2018 2053 DNR [id number]  Claudean Severance, MD ED   12/24/2018 1533 12/24/2018 1538 Full Code [id number]  Claudean Severance, MD ED   05/25/2016 0303 05/27/2016 1834 Full Code [id number]  Duayne Cal, NP ED   05/18/2016 2105 05/23/2016 1701 Full Code [id number]  Long, Arlyss Repress, MD ED   03/18/2016 1425 03/20/2016 1525 Full Code [id number]  Lorre Nick, MD ED   11/25/2015 1308 11/29/2015 2059 DNR [id number]  Lora Paula, MD ED   10/18/2015 1224 10/20/2015 1341 Full Code [id number]  Leta Baptist, MD ED   01/28/2015 1426 02/01/2015 1730 Full Code [id number]  Talmage Nap, NP Inpatient   01/27/2015 0731 01/28/2015 1426 Full Code [id number]  Melene Plan, DO ED   03/26/2014 2059 04/06/2014 1657 Full Code [id number]  Nanine Means, NP Inpatient   03/23/2014 1720 03/26/2014 2048 Full Code [id number]  Annett Gula Inpatient   03/22/2013 1210 03/23/2013 1251 Full Code [id number]  Ky Barban, MD Inpatient   10/17/2012 1749 10/18/2012 0416 Full Code [id number]  Roxy Horseman, PA-C ED   07/17/2012 0043 07/17/2012 0637 Full Code [id number]  Jeannetta Ellis, PA-C ED   05/16/2012 2316 05/17/2012 1330 Full  Code [id number]  Janice Norrie, MD ED   03/20/2012 1027 03/20/2012 1931 Full Code [id number]  Katy Apo, MD ED   02/23/2012 1829 02/25/2012 1452 Full Code [id number]  Arbutus Leas, RN Inpatient   Advance Care Planning Activity    Advance Directive Documentation     Most Recent Value  Type of Advance Directive  Healthcare Power of Attorney  Pre-existing out of facility DNR order  (yellow form or pink MOST form)  -  "MOST" Form in Place?  -        IV Access:    Peripheral IV   Procedures and diagnostic studies:   No results found.   Medical Consultants:    None.  Anti-Infectives:   None  Subjective:    [name] no complains  Objective:    Vitals:   03/01/19 2006 03/01/19 2017 03/02/19 0505 03/02/19 0734  BP: 113/83  122/83 137/87  Pulse: (!) 119  (!) 102 (!) 114  Resp: 18  17 19   Temp: 97.9 F (36.6 C)  97.7 F (36.5 C) 98.3 F (36.8 C)  TempSrc: Oral  Oral Oral  SpO2: 96% 99% 100% 98%  Weight:      Height:       SpO2: 98 %   Intake/Output Summary (Last 24 hours) at 03/02/2019 0806 Last data filed at 03/01/2019 1431 Gross per 24 hour  Intake 608 ml  Output -  Net 608 ml   Filed Weights   02/20/19 0438 02/23/19 1954  Weight: 120.2 kg 120.2 kg    Exam: General exam: In no acute distress. Respiratory system: Good air movement and clear to auscultation. Cardiovascular system: S1 & S2 heard, RRR. No JVD. Gastrointestinal system: Abdomen is nondistended, soft and nontender.  Central nervous system: Alert and oriented. No focal neurological deficits. Extremities: No pedal edema. Skin: No rashes, lesions or ulcers Psychiatry: Judgement and insight appear normal. Mood & affect appropriate.    Data Reviewed:    Labs: Basic Metabolic Panel: Recent Labs  Lab 02/24/19 0220 02/25/19 0340  NA 138 140  K 4.2 4.7  CL 106 104  CO2 24 26  GLUCOSE 105* 72  BUN 11 12  CREATININE 0.85 0.95  CALCIUM 9.3 9.4  MG 1.6* 1.9  PHOS 2.5 3.0   GFR Estimated Creatinine Clearance: 105.1 mL/min (by C-G formula based on SCr of 0.95 mg/dL). Liver Function Tests: Recent Labs  Lab 02/24/19 0220 02/25/19 0340  AST 31 37  ALT 121* 112*  ALKPHOS 52 52  BILITOT 0.4 0.5  PROT 6.8 7.2  ALBUMIN 3.8 4.0   No results for input(s): LIPASE, AMYLASE in the last 168 hours. No results for input(s): AMMONIA in the last 168 hours.  Coagulation profile No results for input(s): INR, PROTIME in the last 168 hours. COVID-19 Labs  No results for input(s): DDIMER, FERRITIN, LDH, CRP in the last 72 hours.  Lab Results  Component Value Date   SARSCOV2NAA POSITIVE (A) 02/20/2019   [place] NEGATIVE 12/24/2018    CBC: Recent Labs  Lab 02/24/19 0220 02/25/19 0340  WBC 7.0 6.9  NEUTROABS 4.9 4.3  HGB 14.7 16.5  HCT 44.5 50.2  MCV 86.1 87.6  PLT 248 279   Cardiac Enzymes: No results for input(s): CKTOTAL, CKMB, CKMBINDEX, TROPONINI in the last 168 hours. BNP (last 3 results) No results for input(s): PROBNP in the last 8760 hours. CBG: No results for input(s): GLUCAP in the last 168 hours. D-Dimer: No results for input(s): DDIMER  in the last 72 hours. Hgb A1c: No results for input(s): HGBA1C in the last 72 hours. Lipid Profile: No results for input(s): CHOL, HDL, LDLCALC, TRIG, CHOLHDL, LDLDIRECT in the last 72 hours. Thyroid function studies: No results for input(s): TSH, T4TOTAL, T3FREE, THYROIDAB in the last 72 hours.  Invalid input(s): FREET3 Anemia work up: No results for input(s): VITAMINB12, FOLATE, FERRITIN, TIBC, IRON, RETICCTPCT in the last 72 hours. Sepsis Labs: Recent Labs  Lab 02/24/19 0220 02/25/19 0340  WBC 7.0 6.9   Microbiology No results found for this or any previous visit (from the past 240 hour(s)).   Medications:   . amLODipine  10 mg Oral Daily  . aspirin EC  81 mg Oral Daily  . clopidogrel  75 mg Oral Daily  . enoxaparin (LOVENOX) injection  60 mg Subcutaneous Daily  . folic acid  1 mg Oral Daily  . gabapentin  300 mg Oral TID  . lisinopril  40 mg Oral Daily  . multivitamin with minerals  1 tablet Oral Daily  . OLANZapine  10 mg Oral BID  . pantoprazole  40 mg Oral Daily  . pravastatin  20 mg Oral Daily  . sodium chloride flush  3 mL Intravenous Q12H  . thiamine  100 mg Oral Daily  . vitamin C  500 mg Oral Daily  . zinc sulfate  220 mg Oral Daily   Continuous  Infusions:    LOS: 8 days   Marinda Elk  [facility]  03/02/2019, 8:06 AM

## 2019-03-02 NOTE — Plan of Care (Signed)
  Asked patient if he'd like [place] to call or update anyone on his status and possible DC plans.  Patient replied, "No thank you, you don't have to do that." - Sitter in room to witness.

## 2019-03-02 NOTE — Plan of Care (Addendum)
Tele psych just completed.  Patient informed that he'd probably be "released" from psych and they don't feel, at this point, he'll need In-patient [facility].  HR increased and patient became agitated and almost angry.  Patient demanding to speak to Dr.  Edison Simon, " I am not suicidal and if they're not gonna help me, I just wanta go to that covid hotel - till I find another place to live.  I need to see the Dr. Now!!"  Dr. Informed, PO Haldol orders placed (as needed) and given.

## 2019-03-03 MED ORDER — MELATONIN 3 MG PO TABS: 6.0000 mg | ORAL_TABLET | ORAL | 0 refills | Status: DC | PRN

## 2019-03-03 MED ORDER — PANTOPRAZOLE SODIUM 40 MG PO TBEC: 40.0000 mg | DELAYED_RELEASE_TABLET | Freq: Every day | ORAL | 0 refills | Status: DC

## 2019-03-03 MED ORDER — CLONIDINE HCL 0.1 MG PO TABS: 0.1000 mg | ORAL_TABLET | Freq: Four times a day (QID) | ORAL | 11 refills | Status: DC | PRN

## 2019-03-03 MED ORDER — ASPIRIN EC 81 MG PO TBEC: 81.0000 mg | DELAYED_RELEASE_TABLET | Freq: Every day | ORAL | 3 refills | Status: DC

## 2019-03-03 MED ORDER — SIMETHICONE 80 MG PO CHEW
160.0000 mg | CHEWABLE_TABLET | Freq: Four times a day (QID) | ORAL | Status: DC | PRN
  Administered 2019-03-03: 160 mg via ORAL
  Filled 2019-03-03 (×2): qty 2

## 2019-03-03 MED ORDER — OLANZAPINE 10 MG PO TABS: 10.0000 mg | ORAL_TABLET | Freq: Two times a day (BID) | ORAL | 3 refills | Status: DC

## 2019-03-03 MED ORDER — CLOPIDOGREL BISULFATE 75 MG PO TABS: 75.0000 mg | ORAL_TABLET | Freq: Every day | ORAL | 0 refills | Status: DC

## 2019-03-03 MED ORDER — LISINOPRIL 40 MG PO TABS: 40.0000 mg | ORAL_TABLET | Freq: Every day | ORAL | 0 refills | Status: DC

## 2019-03-03 MED ORDER — AMLODIPINE BESYLATE 10 MG PO TABS: 10.0000 mg | ORAL_TABLET | Freq: Every day | ORAL | 0 refills | Status: DC

## 2019-03-03 MED ORDER — DICYCLOMINE HCL 20 MG PO TABS: 20.0000 mg | ORAL_TABLET | Freq: Four times a day (QID) | ORAL | 0 refills | Status: DC | PRN

## 2019-03-03 MED ORDER — PRAVASTATIN SODIUM 20 MG PO TABS: 20.0000 mg | ORAL_TABLET | Freq: Every day | ORAL | 3 refills | Status: DC

## 2019-03-03 NOTE — TOC Progression Note (Addendum)
Transition of Care [facility]) - Progression Note    Patient Details  Name: [name] MRN: [id number] Date of Birth: [birth year]  Transition of Care [facility]) CM/SW Contact  Joaquin Courts, RN Phone Number: 03/03/2019, 11:07 AM  Clinical Narrative:    CM noted patient is written to dc home today, per MD note cleared for dc home by psych, no longer need for inpatient treatment. [facility] to [facility]'s [facility]. Awaiting call back.   Expected Discharge Plan: Psychiatric Hospital Barriers to Discharge: Continued Medical Work up, Requiring sitter/restraints, Homeless with medical needs  Expected Discharge Plan and Services Expected Discharge Plan: [place] arrangements for the past 2 months: Homeless Expected Discharge Date: 03/03/19                                     Social Determinants of Health (SDOH) Interventions    Readmission Risk Interventions No flowsheet data found.

## 2019-03-03 NOTE — Plan of Care (Signed)
  Problem: Respiratory: Goal: Will maintain a patent airway Outcome: Progressing Goal: Complications related to the disease process, condition or treatment will be avoided or minimized Outcome: Progressing   

## 2019-03-03 NOTE — Discharge Summary (Signed)
Physician Discharge Summary  ALP GOLDWATER YIF:[id number] DOB: 1960/12/24 DOA: 02/20/2019  PCP: [facility]  Admit date: 02/20/2019 Discharge date: 03/03/2019  Admitted From: Home Disposition:  Isolation Home  Recommendations for Outpatient Follow-up:  1. Follow up with PCP in 1-2 weeks 2. Please obtain BMP/CBC in one week   Home Health:No Equipment/Devices:None  Discharge Condition:Stable CODE STATUS:Full Diet recommendation: Heart Healthy   Brief/Interim Summary:  58 y.o. male past medical history significant for CAD, essential hypertension, history of suicidal attempts, hepatitis C, polysubstance abuse, bipolar disorder, depression who presents to the ED on 02/20/2019 with substernal chest pain worse with breathing and movement, right upper quadrant abdominal pain nausea vomiting and diarrhea.  He also related some homicidal ideation.  He was found to be COVID-19 by PCR, chest x-ray showed bilateral infiltrates.  Was seen by psych and recommended inpatient psych admission, but due to his COVID-19 positivity he was transferred to [facility] in 02/23/2019  Discharge Diagnoses:  Principal Problem:   Suicidal ideation Active Problems:   Polysubstance abuse (HCC)   Chest pain   Hepatitis C   Polysubstance dependence (HCC)   Cocaine use disorder, severe, dependence (HCC)   Abdominal pain   Affective psychosis, bipolar (HCC)   Pain in the chest   Essential hypertension   Cocaine abuse with cocaine-induced mood disorder (HCC)   Abdominal pain, epigastric   Homicidal ideation   COVID-19 virus infection   Elevated liver enzymes   Elevated lipase   Stimulant-induced mood disorder (HCC) Homicidal and suicidal ideation: He was evaluated by psychiatrist who initially recommended inpatient psychiatric admission but due to his COVID-19 positivity he was kept at [facility], psychiatry change his medication and evaluated him daily he spent about 9 days in the hospital  being evaluated daily by psych. After several days of evaluation the psych recommended the patient stable to discharge home with no inpatient psychiatry further needs. He was discharged on Wellbutrin and Zyprexa which she will continue as an outpatient will follow up with the [facility]  COVID-19 viral infection: Incidentally he remained totally asymptomatic.  Mild transaminitis: Possibly due to COVID-19 plus or minus alcohol abuse, his LFT's improved.  Acute on chronic epigastric abdominal pain: Resolved with PPI.  Atypical chest pain with a history of CAD: EKG was unremarkable cardiac enzymes remain unremarkable he was continue on dual platelet therapy.  Polysubstance abuse: UDS positive for amphetamine, cocaine and marijuana. No signs of DTs.  Essential hypertension: Continue Norvasc lisinopril and Clonidine. No.  Due to ongoing tobacco abuse.   Discharge Instructions  Discharge Instructions    Diet - low sodium heart healthy   Complete by: As directed    Increase activity slowly   Complete by: As directed    MyChart COVID-19 home monitoring program   Complete by: Mar 03, 2019    Is the patient willing to use the MyChart Mobile App for home monitoring?: No     Allergies as of 03/03/2019      Reactions   Lactulose Hives, Nausea Only   Lactose Intolerance (gi) Hives, Nausea Only      Medication List    TAKE these medications   amLODipine 10 MG tablet Commonly known as: NORVASC Take 1 tablet (10 mg total) by mouth daily.   aspirin EC 81 MG tablet Take 1 tablet (81 mg total) by mouth daily.   cloNIDine 0.1 MG tablet Commonly known as: CATAPRES Take 1 tablet (0.1 mg total) by mouth every 6 (six) hours  as needed (SBP>150).   clopidogrel 75 MG tablet Commonly known as: PLAVIX Take 1 tablet (75 mg total) by mouth daily.   dicyclomine 20 MG tablet Commonly known as: BENTYL Take 1 tablet (20 mg total) by mouth 4 (four) times daily as needed (abdominal  cramps). What changed: when to take this   gabapentin 300 MG capsule Commonly known as: NEURONTIN Take 1 capsule (300 mg total) by mouth 3 (three) times daily.   lisinopril 40 MG tablet Commonly known as: ZESTRIL Take 1 tablet (40 mg total) by mouth daily.   Melatonin 3 MG Tabs Take 2 tablets (6 mg total) by mouth as needed.   OLANZapine 10 MG tablet Commonly known as: ZYPREXA Take 1 tablet (10 mg total) by mouth 2 (two) times daily.   pantoprazole 40 MG tablet Commonly known as: PROTONIX Take 1 tablet (40 mg total) by mouth daily.   pravastatin 20 MG tablet Commonly known as: PRAVACHOL Take 1 tablet (20 mg total) by mouth daily.       Allergies  Allergen Reactions  . Lactulose Hives and Nausea Only  . Lactose Intolerance (Gi) Hives and Nausea Only    Consultations:  None   Procedures/Studies: Dg Chest 2 View  Result Date: 02/20/2019 CLINICAL DATA:  Chest pain. EXAM: CHEST - 2 VIEW COMPARISON:  CT 12/24/2018.  Chest x-ray 12/24/2018. FINDINGS: Mediastinum hilar structures normal. Heart size normal. Coronary artery calcification noted. Low lung volumes with mild bibasilar atelectasis again noted. No pleural effusion or pneumothorax. No acute bony abnormality identified. IMPRESSION: 1. Coronary artery calcification noted consistent coronary artery disease. Heart size normal. 2. Low lung volumes with mild bibasilar atelectasis again noted. No interim change. Chest is stable from prior exam. Electronically Signed   By: Marcello Moores  Register   On: 02/20/2019 06:07   Ct Abdomen Pelvis W Contrast  Result Date: 02/20/2019 CLINICAL DATA:  Right lower quadrant pain and diarrhea since yesterday morning. EXAM: CT ABDOMEN AND PELVIS WITH CONTRAST TECHNIQUE: Multidetector CT imaging of the abdomen and pelvis was performed using the standard protocol following bolus administration of intravenous contrast. CONTRAST:  158mL OMNIPAQUE IOHEXOL 300 MG/ML  SOLN COMPARISON:  12/24/2018  FINDINGS: Lower chest: No acute abnormality. Hepatobiliary: No focal liver abnormality is seen. No gallstones, gallbladder wall thickening, or biliary dilatation. Pancreas: Unremarkable. No pancreatic ductal dilatation or surrounding inflammatory changes. Spleen: Normal in size without focal abnormality. Adrenals/Urinary Tract: No adrenal masses. Kidneys are normal in size, orientation and position with symmetric enhancement and excretion. 17 mm cyst, midpole the right kidney, stable. No other renal masses, no stones and no hydronephrosis. Normal ureters. Normal bladder. Stomach/Bowel: Stomach is unremarkable. Small bowel and colon are normal in caliber. No wall thickening. No inflammation. There are several small left colon diverticula without diverticulitis. Normal appendix visualized. Vascular/Lymphatic: Mild aortic atherosclerosis. No aneurysm or other vascular abnormality. No enlarged lymph nodes. Reproductive: Unremarkable. Other: No abdominal wall hernia or abnormality. No abdominopelvic ascites. Musculoskeletal: No fracture or acute finding. No osteoblastic or osteolytic lesions. IMPRESSION: 1. No acute findings. No findings to account for the patient's pain. No bowel inflammation. Normal appendix visualized. 2. Mild aortic atherosclerosis. Electronically Signed   By: Lajean Manes M.D.   On: 02/20/2019 10:18   Subjective: No complains  Discharge Exam: Vitals:   03/03/19 0447 03/03/19 0700  BP: 114/77 (!) 129/94  Pulse:  100  Resp: 18 18  Temp: 97.8 F (36.6 C) 98.7 F (37.1 C)  SpO2:  98%   Vitals:  03/02/19 2126 03/02/19 2130 03/03/19 0447 03/03/19 0700  BP: 104/67  114/77 (!) 129/94  Pulse: 100   100  Resp:   18 18  Temp:  97.8 F (36.6 C) 97.8 F (36.6 C) 98.7 F (37.1 C)  TempSrc:   Oral Oral  SpO2: 99%   98%  Weight:      Height:        General: Pt is alert, awake, not in acute distress Cardiovascular: RRR, S1/S2 +, no rubs, no gallops Respiratory: CTA bilaterally, no  wheezing, no rhonchi Abdominal: Soft, NT, ND, bowel sounds + Extremities: no edema, no cyanosis    The results of significant diagnostics from this hospitalization (including imaging, microbiology, ancillary and laboratory) are listed below for reference.     Microbiology: No results found for this or any previous visit (from the past 240 hour(s)).   Labs: BNP (last 3 results) No results for input(s): BNP in the last 8760 hours. Basic Metabolic Panel: Recent Labs  Lab 02/25/19 0340  NA 140  K 4.7  CL 104  CO2 26  GLUCOSE 72  BUN 12  CREATININE 0.95  CALCIUM 9.4  MG 1.9  PHOS 3.0   Liver Function Tests: Recent Labs  Lab 02/25/19 0340  AST 37  ALT 112*  ALKPHOS 52  BILITOT 0.5  PROT 7.2  ALBUMIN 4.0   No results for input(s): LIPASE, AMYLASE in the last 168 hours. No results for input(s): AMMONIA in the last 168 hours. CBC: Recent Labs  Lab 02/25/19 0340  WBC 6.9  NEUTROABS 4.3  HGB 16.5  HCT 50.2  MCV 87.6  PLT 279   Cardiac Enzymes: No results for input(s): CKTOTAL, CKMB, CKMBINDEX, TROPONINI in the last 168 hours. BNP: Invalid input(s): POCBNP CBG: No results for input(s): GLUCAP in the last 168 hours. D-Dimer No results for input(s): DDIMER in the last 72 hours. Hgb A1c No results for input(s): HGBA1C in the last 72 hours. Lipid Profile No results for input(s): CHOL, HDL, LDLCALC, TRIG, CHOLHDL, LDLDIRECT in the last 72 hours. Thyroid function studies No results for input(s): TSH, T4TOTAL, T3FREE, THYROIDAB in the last 72 hours.  Invalid input(s): FREET3 Anemia work up No results for input(s): VITAMINB12, FOLATE, FERRITIN, TIBC, IRON, RETICCTPCT in the last 72 hours. Urinalysis    Component Value Date/Time   COLORURINE YELLOW 12/24/2018 0810   APPEARANCEUR CLEAR 12/24/2018 0810   LABSPEC 1.021 12/24/2018 0810   PHURINE 5.0 12/24/2018 0810   GLUCOSEU NEGATIVE 12/24/2018 0810   HGBUR NEGATIVE 12/24/2018 0810   BILIRUBINUR NEGATIVE  12/24/2018 0810   KETONESUR NEGATIVE 12/24/2018 0810   PROTEINUR NEGATIVE 12/24/2018 0810   UROBILINOGEN 1.0 12/16/2014 2312   NITRITE NEGATIVE 12/24/2018 0810   LEUKOCYTESUR NEGATIVE 12/24/2018 0810   Sepsis Labs Invalid input(s): PROCALCITONIN,  WBC,  LACTICIDVEN Microbiology No results found for this or any previous visit (from the past 240 hour(s)).   Time coordinating discharge: Over 40 minutes  SIGNED:   Marinda Elk, MD  [facility] 03/03/2019, 8:15 AM Pager   If 7PM-7AM, please contact night-coverage [email or web address] Password [email or web address]

## 2019-03-03 NOTE — TOC Progression Note (Signed)
Transition of Care [facility]) - Progression Note    Patient Details  Name: [name] MRN: [id number] Date of Birth: 09-27-1960  [facility]) CM/SW Contact  Joaquin Courts, RN Phone Number: 03/03/2019, 3:13 PM  Clinical Narrative:    CM spoke with patient who reports he was staying at a motel prior to becoming sick and coming to the hospital, at the motel he paid daily for his stay but has been unable to work while at the hospital and has no money to pay for another motel. Patient now has no place to stay and is homeless. CM reached out to patient's [name] ([place], [phone] or [phone]) to discuss resources that may be available to him and to schedule mental health follow-up, could not get in touch, voicemail left. CM called and left voicemail for [facility], and CSW Junie Panning had them place patient on waiting list for housing placement/ hotel placement. No openings today, will have to wait for [facility] homeless[facility] to arrange a  Place for patient to stay.   Expected Discharge Plan: Psychiatric Hospital Barriers to Discharge: Continued Medical Work up, Requiring sitter/restraints, Homeless with medical needs  Expected Discharge Plan and Services Expected Discharge Plan: [place] arrangements for the past 2 months: Homeless Expected Discharge Date: 03/03/19                                     Social Determinants of Health (SDOH) Interventions    Readmission Risk Interventions No flowsheet data found.

## 2019-03-04 MED ORDER — CLONIDINE HCL 0.1 MG PO TABS: 0.1000 mg | ORAL_TABLET | Freq: Four times a day (QID) | ORAL | 11 refills | Status: DC | PRN

## 2019-03-04 MED ORDER — LISINOPRIL 40 MG PO TABS: 40.0000 mg | ORAL_TABLET | Freq: Every day | ORAL | 0 refills | Status: DC

## 2019-03-04 MED ORDER — PRAVASTATIN SODIUM 20 MG PO TABS: 20.0000 mg | ORAL_TABLET | Freq: Every day | ORAL | 3 refills | Status: DC

## 2019-03-04 MED ORDER — OLANZAPINE 10 MG PO TABS: 10.0000 mg | ORAL_TABLET | Freq: Two times a day (BID) | ORAL | 3 refills | Status: DC

## 2019-03-04 MED ORDER — PANTOPRAZOLE SODIUM 40 MG PO TBEC: 40.0000 mg | DELAYED_RELEASE_TABLET | Freq: Every day | ORAL | 0 refills | Status: DC

## 2019-03-04 MED ORDER — MELATONIN 3 MG PO TABS: 6.0000 mg | ORAL_TABLET | ORAL | 0 refills | Status: DC | PRN

## 2019-03-04 MED ORDER — DICYCLOMINE HCL 20 MG PO TABS: 20.0000 mg | ORAL_TABLET | Freq: Four times a day (QID) | ORAL | 0 refills | Status: DC | PRN

## 2019-03-04 MED ORDER — CLOPIDOGREL BISULFATE 75 MG PO TABS: 75.0000 mg | ORAL_TABLET | Freq: Every day | ORAL | 0 refills | Status: DC

## 2019-03-04 MED ORDER — GABAPENTIN 300 MG PO CAPS: 300.0000 mg | ORAL_CAPSULE | Freq: Three times a day (TID) | ORAL | 0 refills | Status: DC

## 2019-03-04 MED ORDER — AMLODIPINE BESYLATE 10 MG PO TABS: 10.0000 mg | ORAL_TABLET | Freq: Every day | ORAL | 0 refills | Status: DC

## 2019-03-04 MED ORDER — ASPIRIN EC 81 MG PO TBEC: 81.0000 mg | DELAYED_RELEASE_TABLET | Freq: Every day | ORAL | 3 refills | Status: DC

## 2019-03-04 NOTE — Progress Notes (Addendum)
[facility] PROGRESS NOTE    Progress Note  [name]  JSR:[id number] DOB: 1961/02/22 DOA: 02/20/2019 PCP: [facility]     Brief Narrative:   [name] is an 58 y.o. male past medical history significant for CAD, essential hypertension, history of suicidal attempts, hepatitis C, polysubstance abuse, bipolar disorder, depression who presents to the ED on 02/20/2019 with substernal chest pain worse with breathing and movement, right upper quadrant abdominal pain nausea vomiting and diarrhea.  He also related some homicidal ideation.  He was found to be COVID-19 by PCR, chest x-ray showed bilateral infiltrates.  Was seen by psych and recommended inpatient psych admission, but due to his COVID-19 positivity he was transferred to [facility] in 02/23/2019.  Assessment/Plan:   Homicidal and Suicidal ideation He was evaluated by psychiatry was in the hospital was recommended inpatient psych admission after being cleared from a COVID-19 standpoint. His initial test for SARS-CoV-2 PCR was + on 02/20/2019. Currently awaiting placement to a COVID-19 hotel/motel. He needs 2 more days of isolation.  Acute COVID-19 viral infection: Incidental finding, initial SARS-CoV-2 PCR was positive on 02/20/2019. We will need to be isolated for treatment additional more days.  Mild transaminitis: Possibly due to COVID-19 and/or alcohol abuse.. There is slowly trending down, ALT is the that is slightly elevated.  Acute on chronic epigastric abdominal pain: Now resolved continue PPI.  Atypical chest pain with history of CAD: EKG and cardiac markers were unremarkable. Continue with dual antiplatelet therapy. He had no further chest pain.  Polysubstance abuse: UDS was positive for amphetamine cocaine and marijuana. No signs of DTs.  Essential hypertension: Continue Norvasc lisinopril and Cardizem. Atenolol has been stopped due to ongoing cocaine abuse.  Alcohol abuse: No signs of  withdrawal.  Hepatitis C positivity: We will need to follow-up with GI as an outpatient.  Hypomagnesemia: Replete orally now stable.    DVT prophylaxis: lovenox Family Communication:none Disposition Plan/Barrier to D/C: Awaiting placement at a COVID-19 hotel/motel. Code Status:     Code Status Orders  (From admission, onward)         Start     Ordered   02/22/19 1107  Full code  Continuous     02/22/19 1113        Code Status History    Date Active Date Inactive Code Status Order ID Comments User Context   12/24/2018 1538 12/26/2018 2053 DNR [id number]  Claudean Severance, MD ED   12/24/2018 1533 12/24/2018 1538 Full Code [id number]  Claudean Severance, MD ED   05/25/2016 0303 05/27/2016 1834 Full Code [id number]  Duayne Cal, NP ED   05/18/2016 2105 05/23/2016 1701 Full Code [id number]  Long, Arlyss Repress, MD ED   03/18/2016 1425 03/20/2016 1525 Full Code [id number]  Lorre Nick, MD ED   11/25/2015 1308 11/29/2015 2059 DNR [id number]  Lora Paula, MD ED   10/18/2015 1224 10/20/2015 1341 Full Code [id number]  Leta Baptist, MD ED   01/28/2015 1426 02/01/2015 1730 Full Code [id number]  Talmage Nap, NP Inpatient   01/27/2015 0731 01/28/2015 1426 Full Code [id number]  Melene Plan, DO ED   03/26/2014 2059 04/06/2014 1657 Full Code [id number]  Nanine Means, NP Inpatient   03/23/2014 1720 03/26/2014 2048 Full Code [id number]  Annett Gula Inpatient   03/22/2013 1210 03/23/2013 1251 Full Code [id number]  Ky Barban, MD Inpatient   10/17/2012 1749 10/18/2012 0416 Full Code [id number]  Roxy Horseman, PA-C ED   07/17/2012 0043 07/17/2012  5188 Full Code [id number]  Gari Crown ED   05/16/2012 2316 05/17/2012 1330 Full Code [id number]  Janice Norrie, MD ED   03/20/2012 1027 03/20/2012 1931 Full Code [id number]  Katy Apo, MD ED   02/23/2012 1829 02/25/2012 1452 Full Code [id number]  Arbutus Leas, RN Inpatient   Advance Care Planning Activity    Advance Directive  Documentation     Most Recent Value  Type of Advance Directive  Healthcare Power of Attorney  Pre-existing out of facility DNR order (yellow form or pink MOST form)  -  "MOST" Form in Place?  -        IV Access:    Peripheral IV   Procedures and diagnostic studies:   No results found.   Medical Consultants:    None.  Anti-Infectives:   None  Subjective:    [name] no complains  Objective:    Vitals:   03/03/19 1557 03/03/19 2025 03/04/19 0250 03/04/19 0700  BP: 109/78 97/67 100/78 112/88  Pulse: 100 99 83 100  Resp: 18 18 18 16   Temp: 98.6 F (37 C) 98.3 F (36.8 C) 98.7 F (37.1 C) 98 F (36.7 C)  TempSrc: Oral Oral Oral Oral  SpO2: 97% 97% 100% 96%  Weight:      Height:       SpO2: 96 %   Intake/Output Summary (Last 24 hours) at 03/04/2019 0758 Last data filed at 03/04/2019 0250 Gross per 24 hour  Intake 980 ml  Output -  Net 980 ml   Filed Weights   02/20/19 0438 02/23/19 1954  Weight: 120.2 kg 120.2 kg    Exam: General exam: In no acute distress. Respiratory system: Good air movement and clear to auscultation. Cardiovascular system: S1 & S2 heard, RRR. No JVD. Gastrointestinal system: Abdomen is nondistended, soft and nontender.  Central nervous system: Alert and oriented. No focal neurological deficits. Extremities: No pedal edema. Skin: No rashes, lesions or ulcers Psychiatry: Judgement and insight appear normal. Mood & affect appropriate.    Data Reviewed:    Labs: Basic Metabolic Panel: No results for input(s): NA, K, CL, CO2, GLUCOSE, BUN, CREATININE, CALCIUM, MG, PHOS in the last 168 hours. GFR Estimated Creatinine Clearance: 105.1 mL/min (by C-G formula based on SCr of 0.95 mg/dL). Liver Function Tests: No results for input(s): AST, ALT, ALKPHOS, BILITOT, PROT, ALBUMIN in the last 168 hours. No results for input(s): LIPASE, AMYLASE in the last 168 hours. No results for input(s): AMMONIA in the last 168 hours.  Coagulation profile No results for input(s): INR, PROTIME in the last 168 hours. COVID-19 Labs  No results for input(s): DDIMER, FERRITIN, LDH, CRP in the last 72 hours.  Lab Results  Component Value Date   SARSCOV2NAA POSITIVE (A) 02/20/2019   [place] NEGATIVE 12/24/2018    CBC: No results for input(s): WBC, NEUTROABS, HGB, HCT, MCV, PLT in the last 168 hours. Cardiac Enzymes: No results for input(s): CKTOTAL, CKMB, CKMBINDEX, TROPONINI in the last 168 hours. BNP (last 3 results) No results for input(s): PROBNP in the last 8760 hours. CBG: No results for input(s): GLUCAP in the last 168 hours. D-Dimer: No results for input(s): DDIMER in the last 72 hours. Hgb A1c: No results for input(s): HGBA1C in the last 72 hours. Lipid Profile: No results for input(s): CHOL, HDL, LDLCALC, TRIG, CHOLHDL, LDLDIRECT in the last 72 hours. Thyroid function studies: No results for input(s): TSH, T4TOTAL, T3FREE, THYROIDAB in the last 72 hours.  Invalid  input(s): FREET3 Anemia work up: No results for input(s): VITAMINB12, FOLATE, FERRITIN, TIBC, IRON, RETICCTPCT in the last 72 hours. Sepsis Labs: No results for input(s): PROCALCITON, WBC, LATICACIDVEN in the last 168 hours. Microbiology No results found for this or any previous visit (from the past 240 hour(s)).   Medications:   . amLODipine  10 mg Oral Daily  . aspirin EC  81 mg Oral Daily  . clopidogrel  75 mg Oral Daily  . enoxaparin (LOVENOX) injection  60 mg Subcutaneous Daily  . folic acid  1 mg Oral Daily  . gabapentin  300 mg Oral TID  . lisinopril  40 mg Oral Daily  . multivitamin with minerals  1 tablet Oral Daily  . OLANZapine  10 mg Oral BID  . pantoprazole  40 mg Oral Daily  . pravastatin  20 mg Oral Daily  . sodium chloride flush  3 mL Intravenous Q12H  . thiamine  100 mg Oral Daily  . vitamin C  500 mg Oral Daily  . zinc sulfate  220 mg Oral Daily   Continuous Infusions:    LOS: 10 days   Marinda Elk  [facility]  03/04/2019, 7:58 AM

## 2019-03-04 NOTE — TOC Progression Note (Signed)
Transition of Care [facility]) - Progression Note    Patient Details  Name: [name] MRN: [id number] Date of Birth: 1960-09-20  Transition of Care [facility]) CM/SW Contact  Joaquin Courts, RN Phone Number: 03/04/2019, 1:46 PM  Clinical Narrative:   CM spoke with partners ending homelessness rep who states patient has been arranged to stay at [facility]. CM spoke with [facility] who will fill patient's prescriptions and courier medications over to [facility], anticipate having them there by 5 pm today. Patient was set up with match program as he usually gets his medications through the [place] who will not pay for his medications from non [facility] pharmacy and patient does not have any money to cover his cost of medications nor any way of getting the medication other than via courier. Patient will be transported to [facility] by [employer]. The shelter has been made aware of this and to expect patient this evening. CM spoke with patient and he is in agreement with this plan.      Expected Discharge Plan: Homeless Shelter Barriers to Discharge: No Barriers Identified  Expected Discharge Plan and Services Expected Discharge Plan: Homeless Shelter   Discharge Planning Services: CM Consult   Living arrangements for the past 2 months: Hotel/Motel Expected Discharge Date: 03/03/19                                     Social Determinants of Health (SDOH) Interventions    Readmission Risk Interventions No flowsheet data found.

## 2019-03-04 NOTE — Progress Notes (Signed)
Patient discharged today. Reviewed AVS Discharge Instructions with patient. Patient verbalized understanding. Patient given copy of AVS instructions. Last VSS. BP 112/88 (BP Location: Right Arm)   Pulse (!) 110   Temp 97.6 F (36.4 C) (Axillary)   Resp 20   Ht 5\' 7"  (1.702 m)   Wt 120.2 kg   SpO2 97%   BMI 41.50 kg/m  No IV access to remove. Patient left in care of ambulance staff care to go to shelter. Pt given medications in sealed brown bag that came from pharmacy already sealed.

## 2019-10-27 ENCOUNTER — Encounter (HOSPITAL_COMMUNITY): Payer: Self-pay

## 2019-10-27 ENCOUNTER — Other Ambulatory Visit: Payer: Self-pay

## 2019-10-27 ENCOUNTER — Emergency Department (HOSPITAL_COMMUNITY)
Admission: EM | Admit: 2019-10-27 | Discharge: 2019-10-28 | Disposition: A | Discharge status: Other Acute Inpatient Hospital | Payer: Medicare Other | Attending: Emergency Medicine | Admitting: Emergency Medicine

## 2019-10-27 DIAGNOSIS — Z20822 Contact with and (suspected) exposure to covid-19: Secondary | ICD-10-CM | POA: Diagnosis not present

## 2019-10-27 DIAGNOSIS — I129 Hypertensive chronic kidney disease with stage 1 through stage 4 chronic kidney disease, or unspecified chronic kidney disease: Secondary | ICD-10-CM | POA: Diagnosis not present

## 2019-10-27 DIAGNOSIS — R45851 Suicidal ideations: Secondary | ICD-10-CM | POA: Diagnosis not present

## 2019-10-27 DIAGNOSIS — N189 Chronic kidney disease, unspecified: Secondary | ICD-10-CM | POA: Insufficient documentation

## 2019-10-27 DIAGNOSIS — Z79899 Other long term (current) drug therapy: Secondary | ICD-10-CM | POA: Diagnosis not present

## 2019-10-27 DIAGNOSIS — F17210 Nicotine dependence, cigarettes, uncomplicated: Secondary | ICD-10-CM | POA: Diagnosis not present

## 2019-10-27 DIAGNOSIS — F1010 Alcohol abuse, uncomplicated: Secondary | ICD-10-CM | POA: Diagnosis not present

## 2019-10-27 DIAGNOSIS — F332 Major depressive disorder, recurrent severe without psychotic features: Secondary | ICD-10-CM | POA: Insufficient documentation

## 2019-10-27 DIAGNOSIS — Z7982 Long term (current) use of aspirin: Secondary | ICD-10-CM | POA: Insufficient documentation

## 2019-10-27 DIAGNOSIS — F1290 Cannabis use, unspecified, uncomplicated: Secondary | ICD-10-CM | POA: Diagnosis not present

## 2019-10-27 LAB — COMPREHENSIVE METABOLIC PANEL
ALT: 22 U/L (ref 0–44)
AST: 16 U/L (ref 15–41)
Albumin: 4 g/dL (ref 3.5–5.0)
Alkaline Phosphatase: 42 U/L (ref 38–126)
Anion gap: 7 (ref 5–15)
BUN: 16 mg/dL (ref 6–20)
CO2: 28 mmol/L (ref 22–32)
Calcium: 9.2 mg/dL (ref 8.9–10.3)
Chloride: 107 mmol/L (ref 98–111)
Creatinine, Ser: 0.89 mg/dL (ref 0.61–1.24)
GFR calc Af Amer: >60 mL/min (ref >60)
GFR calc non Af Amer: >60 mL/min (ref >60)
Glucose, Bld: 76 mg/dL (ref 70–99)
Potassium: 3.6 mmol/L (ref 3.5–5.1)
Sodium: 142 mmol/L (ref 135–145)
Total Bilirubin: 0.7 mg/dL (ref 0.3–1.2)
Total Protein: 7.3 g/dL (ref 6.5–8.1)

## 2019-10-27 LAB — ACETAMINOPHEN LEVEL: Acetaminophen (Tylenol), Serum: <10 ug/mL — ABNORMAL LOW (ref 10–30)

## 2019-10-27 LAB — RAPID URINE DRUG SCREEN, HOSP PERFORMED
Amphetamines: NOT DETECTED
Barbiturates: NOT DETECTED
Benzodiazepines: NOT DETECTED
Cocaine: POSITIVE — AB
Opiates: NOT DETECTED
Tetrahydrocannabinol: POSITIVE — AB

## 2019-10-27 LAB — CBC
HCT: 45.3 % (ref 39.0–52.0)
Hemoglobin: 14.5 g/dL (ref 13.0–17.0)
MCH: 28.7 pg (ref 26.0–34.0)
MCHC: 32 g/dL (ref 30.0–36.0)
MCV: 89.7 fL (ref 80.0–100.0)
Platelets: 257 10*3/uL (ref 150–400)
RBC: 5.05 MIL/uL (ref 4.22–5.81)
RDW: 14.7 % (ref 11.5–15.5)
WBC: 8.4 10*3/uL (ref 4.0–10.5)
nRBC: 0 % (ref 0.0–0.2)

## 2019-10-27 LAB — SALICYLATE LEVEL: Salicylate Lvl: <7 mg/dL — ABNORMAL LOW (ref 7.0–30.0)

## 2019-10-27 LAB — SARS CORONAVIRUS 2 BY RT PCR (HOSPITAL ORDER, PERFORMED IN ~~LOC~~ HOSPITAL LAB): SARS Coronavirus 2: NEGATIVE

## 2019-10-27 LAB — ETHANOL: Alcohol, Ethyl (B): <10 mg/dL (ref <10)

## 2019-10-27 MED ORDER — ACETAMINOPHEN 325 MG PO TABS: 650.0000 mg | ORAL_TABLET | ORAL | Status: DC | PRN

## 2019-10-27 MED ORDER — LORAZEPAM 1 MG PO TABS
0.0000 mg | ORAL_TABLET | Freq: Four times a day (QID) | ORAL | Status: DC
  Administered 2019-10-27 (×2): 1 mg via ORAL
  Administered 2019-10-28: 2 mg via ORAL
  Filled 2019-10-27: qty 2
  Filled 2019-10-27 (×2): qty 1

## 2019-10-27 MED ORDER — THIAMINE HCL 100 MG PO TABS
100.0000 mg | ORAL_TABLET | Freq: Every day | ORAL | Status: DC
  Administered 2019-10-27: 100 mg via ORAL
  Filled 2019-10-27 (×2): qty 1

## 2019-10-27 MED ORDER — LORAZEPAM 2 MG/ML IJ SOLN: 0.0000 mg | Freq: Four times a day (QID) | INTRAMUSCULAR | Status: DC

## 2019-10-27 MED ORDER — LORAZEPAM 1 MG PO TABS: 0.0000 mg | ORAL_TABLET | Freq: Two times a day (BID) | ORAL | Status: DC

## 2019-10-27 MED ORDER — NICOTINE 21 MG/24HR TD PT24: 21.0000 mg | MEDICATED_PATCH | Freq: Every day | TRANSDERMAL | Status: DC

## 2019-10-27 MED ORDER — THIAMINE HCL 100 MG/ML IJ SOLN: 100.0000 mg | Freq: Every day | INTRAMUSCULAR | Status: DC

## 2019-10-27 MED ORDER — LORAZEPAM 2 MG/ML IJ SOLN: 0.0000 mg | Freq: Two times a day (BID) | INTRAMUSCULAR | Status: DC

## 2019-10-27 NOTE — BH Assessment (Signed)
Case was staffed with Gerilyn Pilgrim NP who recommended patient be observed and monitored. Patient will be transferred later this date to the [facility] for continued observation.

## 2019-10-27 NOTE — BH Assessment (Signed)
Patient referred to the following facilities for consideration of bed placement:   [facility]      [facility] Details     [facility] Details     [facility] Details     [facility] Details     [facility] Details     [facility] Details     [facility] Details     [facility] Details     [facility] Details     [facility] Details     [facility] Details     [facility] Details     [facility] Details     [facility]

## 2019-10-27 NOTE — BH Assessment (Signed)
Patient referred to the following facilities for consideration of bed placement:   [facility]      [facility] Details     [facility] Details     [facility] Details     [facility] Details     [facility] Details     [facility] Details     [facility] Details     [facility] Details     [facility] Details     [facility] Details     [facility] Details     [facility] Details     [facility] Details     [facility]         

## 2019-10-27 NOTE — ED Provider Notes (Signed)
[facility]-EMERGENCY DEPT Provider Note   CSN: [id number] Arrival date & time: 10/27/19  1139     History Chief Complaint  Patient presents with  . Suicidal    [name] is a 59 y.o. male with past medical history significant for alcohol use, bipolar, cocaine use, hepatitis C, hypertension, MI, OSA, CKD who presents for evaluation of suicide ideation.  Patient states he has plans to jump out in front of a car.  Has been feeling like this for 3 days.  Patient states he attempted to jump out in front of a car yesterday however they swerved and they did not hit him.  He denies any his head, LOC or anticoagulation.  States he has been inpatient previously.  He denies any headache, lightheadedness, dizziness, chest pain, shortness of breath abdominal pain, diarrhea, dysuria, decreased range of motion, rashes, lesions.  Has been ambulatory without difficulty.  States she does have a history of alcohol, cocaine and marijuana use however he has not used over the last 5 days.  No prior history of DTs or withdrawal seizures.  Denies additional aggravating or relieving factors.  Denies HI.  States he occasionally hears voices however they do not tell him anything.  He is not taking any medications for his bipolar.  History obtained from patient and past medical history. No interpreter used.   HPI     Past Medical History:  Diagnosis Date  . Alcohol abuse   . Anxiety   . Arthritis    "all over" (11/25/2015)  . Bipolar 1 disorder (HCC)   . Bipolar affective disorder (HCC)   . Chronic back pain   . Chronic kidney disease   . Cocaine abuse (HCC)    smokes crack-cocaine  . Depression   . GERD (gastroesophageal reflux disease)   . Gout   . Headache    "q couple days; stress, tension, anger" (11/25/2015)  . Hemorrhoids   . Hepatitis C    "not yet treated" (11/25/2015)  . Hypertension   . Insomnia   . Myocardial infarction [facility]) 11/2015   "supposedly" (11/25/2015)  .  OSA on CPAP    "had mask; it was stolen" (11/25/2015)  . PTSD (post-traumatic stress disorder)   . Tobacco abuse   . Vitamin D deficiency     Patient Active Problem List   Diagnosis Date Noted  . 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 Suicidal ideation 02/22/2019  . Homicidal ideation 02/22/2019  . COVID-19 virus infection 02/22/2019  . Elevated liver enzymes 02/22/2019  . Elevated lipase 02/22/2019  . Abdominal pain, epigastric 12/25/2018  . Acute encephalopathy 05/25/2016  . Cocaine abuse with cocaine-induced mood disorder (HCC) 03/20/2016  . Abnormal nuclear stress test   . Palpitations 11/26/2015  . Troponin level elevated 11/26/2015  . Homeless 11/26/2015  . Essential hypertension 11/26/2015  . SVT (supraventricular tachycardia) (HCC)   . Pain in the chest 11/25/2015  . GERD (gastroesophageal reflux disease)   . Bipolar 1 disorder (HCC)   . Back pain, chronic   . Chronic kidney disease   . Affective psychosis, bipolar (HCC)   . Alcohol use disorder, severe, dependence (HCC)   . Cocaine use disorder, severe, dependence (HCC)   . Abdominal pain   . Bipolar I disorder, most recent episode depressed (HCC)   . Bipolar disorder, current episode depressed, severe, without psychotic features (HCC)   . Bipolar affective disorder, depressed, severe (HCC) 03/26/2014  . Right hip pain   . Medically  noncompliant 03/23/2014  . Dysuria 03/22/2013  . Headache 03/22/2013  . Polysubstance dependence (HCC) 03/21/2012  . Chest pain 02/23/2012  . Anxiety   . Depression   . Polysubstance abuse (HCC)   . Gout   . Vitamin D deficiency   . Hepatitis C     Past Surgical History:  Procedure Laterality Date  . CARDIAC CATHETERIZATION N/A 11/29/2015   Procedure: Left Heart Cath and Coronary Angiography;  Surgeon: Laurey Morale, MD;  Location: [facility] INVASIVE CV LAB;  Service: Cardiovascular;  Laterality: N/A;  . FRACTURE SURGERY    . PATELLA FRACTURE SURGERY Left ~ 1995  .  TONSILLECTOMY         Family History  Problem Relation Age of Onset  . Hypertension Brother   . Diabetes Brother   . Hypertension Mother   . Diabetes Mother   . Arthritis Mother   . Other Sister        bone disease    Social History   Tobacco Use  . Smoking status: Current Every Day Smoker    Packs/day: 0.25    Years: 46.00    Pack years: 11.50    Types: Cigarettes  . Smokeless tobacco: Never Used  Vaping Use  . Vaping Use: Never used  Substance Use Topics  . Alcohol use: Yes  . Drug use: Not Currently    Types: Marijuana, "Crack" cocaine, Cocaine    Home Medications Prior to Admission medications   Medication Sig Start Date End Date Taking? Authorizing Provider  amLODipine (NORVASC) 10 MG tablet Take 1 tablet (10 mg total) by mouth daily. 03/04/19  Yes Marinda Elk, MD  cloNIDine (CATAPRES) 0.1 MG tablet Take 1 tablet (0.1 mg total) by mouth every 6 (six) hours as needed (SBP>150). Patient taking differently: Take 0.1 mg by mouth every 6 (six) hours as needed (If blood pressure is over 150).  03/04/19  Yes Marinda Elk, MD  clopidogrel (PLAVIX) 75 MG tablet Take 1 tablet (75 mg total) by mouth daily. 03/04/19  Yes Marinda Elk, MD  dicyclomine (BENTYL) 20 MG tablet Take 1 tablet (20 mg total) by mouth 4 (four) times daily as needed (abdominal cramps). Patient taking differently: Take 20 mg by mouth 4 (four) times daily as needed for spasms.  03/04/19  Yes Marinda Elk, MD  gabapentin (NEURONTIN) 300 MG capsule Take 1 capsule (300 mg total) by mouth 3 (three) times daily. Patient taking differently: Take 300 mg by mouth 2 (two) times daily.  03/04/19  Yes Marinda Elk, MD  lisinopril (ZESTRIL) 40 MG tablet Take 1 tablet (40 mg total) by mouth daily. 03/04/19  Yes Marinda Elk, MD  OLANZapine (ZYPREXA) 10 MG tablet Take 1 tablet (10 mg total) by mouth 2 (two) times daily. 03/04/19  Yes Marinda Elk, MD  pantoprazole  (PROTONIX) 40 MG tablet Take 1 tablet (40 mg total) by mouth daily. 03/04/19  Yes Marinda Elk, MD  pravastatin (PRAVACHOL) 20 MG tablet Take 1 tablet (20 mg total) by mouth daily. 03/04/19  Yes Marinda Elk, MD  aspirin EC 81 MG tablet Take 1 tablet (81 mg total) by mouth daily. Patient not taking: Reported on 10/27/2019 03/04/19   Marinda Elk, MD  Melatonin 3 MG TABS Take 2 tablets (6 mg total) by mouth as needed. Patient not taking: Reported on 10/27/2019 03/04/19   Marinda Elk, MD    Allergies    Lactulose  Review of Systems  Review of Systems  Constitutional: Negative.   HENT: Negative.   Respiratory: Negative.  Negative for cough.   Cardiovascular: Negative.   Gastrointestinal: Negative.   Genitourinary: Negative.   Musculoskeletal: Negative.   Skin: Negative.   Neurological: Negative.   Psychiatric/Behavioral: Positive for suicidal ideas.  All other systems reviewed and are negative.   Physical Exam Updated Vital Signs BP (!) 142/129   Pulse 68   Temp (!) 97.4 F (36.3 C) (Oral)   Resp 18   Ht 5\' 7"  (1.702 m)   Wt 113.4 kg   SpO2 100%   BMI 39.16 kg/m   Physical Exam Vitals and nursing note reviewed.  Constitutional:      General: He is not in acute distress.    Appearance: He is well-developed. He is not ill-appearing, toxic-appearing or diaphoretic.  HENT:     Head: Normocephalic and atraumatic.     Nose: Nose normal.     Mouth/Throat:     Mouth: Mucous membranes are moist.  Eyes:     Pupils: Pupils are equal, round, and reactive to light.  Cardiovascular:     Rate and Rhythm: Normal rate and regular rhythm.     Pulses: Normal pulses.     Heart sounds: Normal heart sounds.  Pulmonary:     Effort: Pulmonary effort is normal. No respiratory distress.     Breath sounds: Normal breath sounds.  Abdominal:     General: Bowel sounds are normal. There is no distension.     Palpations: Abdomen is soft.     Tenderness: There  is no abdominal tenderness. There is no right CVA tenderness, left CVA tenderness, guarding or rebound.  Musculoskeletal:        General: Normal range of motion.     Cervical back: Normal range of motion and neck supple.  Skin:    General: Skin is warm and dry.     Capillary Refill: Capillary refill takes less than 2 seconds.  Neurological:     General: No focal deficit present.     Mental Status: He is alert and oriented to person, place, and time.     Cranial Nerves: Cranial nerves are intact.     Sensory: Sensation is intact.     Motor: Motor function is intact.     Coordination: Coordination is intact.     Comments: Ambulatory without difficulty  Psychiatric:     Comments: Admits to SI with plan to jump in front of a car.    ED Results / Procedures / Treatments   Labs (all labs ordered are listed, but only abnormal results are displayed) Labs Reviewed  SALICYLATE LEVEL - Abnormal; Notable for the following components:      Result Value   Salicylate Lvl <7.0 (*)    All other components within normal limits  ACETAMINOPHEN LEVEL - Abnormal; Notable for the following components:   Acetaminophen (Tylenol), Serum <10 (*)    All other components within normal limits  RAPID URINE DRUG SCREEN, HOSP PERFORMED - Abnormal; Notable for the following components:   Cocaine POSITIVE (*)    Tetrahydrocannabinol POSITIVE (*)    All other components within normal limits  SARS CORONAVIRUS 2 BY RT PCR (HOSPITAL ORDER, PERFORMED IN [facility])  COMPREHENSIVE METABOLIC PANEL  ETHANOL  CBC    EKG None  Radiology No results found.  Procedures Procedures (including critical care time)  Medications Ordered in ED Medications  LORazepam (ATIVAN) injection 0-4 mg ( Intravenous See Alternative  10/27/19 1711)    Or  LORazepam (ATIVAN) tablet 0-4 mg (1 mg Oral Given 10/27/19 1711)  LORazepam (ATIVAN) injection 0-4 mg (has no administration in time range)    Or  LORazepam  (ATIVAN) tablet 0-4 mg (has no administration in time range)  thiamine tablet 100 mg (100 mg Oral Given 10/27/19 1711)    Or  thiamine (B-1) injection 100 mg ( Intravenous See Alternative 10/27/19 1711)  acetaminophen (TYLENOL) tablet 650 mg (has no administration in time range)  nicotine (NICODERM CQ - dosed in mg/24 hours) patch 21 mg (21 mg Transdermal Refused 10/27/19 1705)    ED Course  I have reviewed the triage vital signs and the nursing notes.  Pertinent labs & imaging results that were available during my care of the patient were reviewed by me and considered in my medical decision making (see chart for details).  59 year old male presents for evaluation of sinus ideation.  Denies any current complaints.  States he does occasionally hear voices however states he cannot understand them.  Does occasionally use alcohol, marijuana and cocaine.  Last use approximately 5 days ago.  No prior history of DTs, withdrawal seizures.  Plan to jump in front of car.  States he attempted yesterday evening however the car swerved and did not hit him.  He denies hitting his head, LOC or anticoagulation.  Plan on labs, reassess.  History of alcohol use will place on CIWA.  He does not appear actively withdrawing at this time.  No tremors bilaterally.  No tachycardia.  Does have mild hypertension.  No active emesis.  Normal mentation.  Labs personally reviewed and interpreted: CBC without leukocytosis Metabolic with electrolyte, renal abnormality Ethanol less than 10 Salicylate, acetaminophen within normal limits. Covid pending  UDS pending however will not change disposition.  Patient medically cleared.  Psychiatry to determine disposition.  Patient is here voluntarily.  Patient attempted to leave the ED. Was placed under IVC. TTS pending.  Patient assessed by Estanislado Emms, counselor.  He has talked with Gerilyn Pilgrim, NP.  They recommend observation.  Per them they will have patient transferred to [facility] for  given patient is now under IVC patient will need to remain here at [facility] long for observation.  Attempted to contact Gerilyn Pilgrim, NP and[employer] were unable to reach.  I was able to contact the [facility] day extender Shuvon Rankin NP.  She states if patients are under IVC they cannot be transferred to [facility] for observation.  She will attempt to contact [employer] to let them know this.  Will remain in the ED for observation to be reassessed by psychiatry tomorrow.  Patient is under IVC at this time. Hold orders placed.   MDM Rules/Calculators/A&P                           Final Clinical Impression(s) / ED Diagnoses Final diagnoses:  Suicidal ideation    Rx / DC Orders ED Discharge Orders    None       Prima Rayner A, PA-C 10/27/19 1743    Sabas Sous, MD 10/28/19 [phone]

## 2019-10-27 NOTE — BH Assessment (Addendum)
Comprehensive Clinical Assessment (CCA) Screening, Triage and Referral Note  10/27/2019 [name] [id number]  Patient presents this date voluntary with S/I. Patient voices a plan to run into traffic. Patient denies any H/I or AVH. Patient states he resides alone and is a veteran receiving services from the [place] in [place]. Patient states he cannot recall the last time he met with that provider due to not having transportation. Patient states he resides alone and receives disabilities. Patient states he was diagnosed with depression over five years ago and has not been "off and on" medications since then. Patient states he has not been on any medications for over six months and is vague in reference to symptoms this date. Patient reports a ongoing SA history to include using cocaine two to three times a month in various amounts. Patient states he has not used that substance in over five days. Patient also reports ongoing alcohol use stating he "drinks a few beers" two to three times a week with last use on 10/26/19 when he reported he consumed two 12 once beers. Patient denies any current withdrawals. Patient denies having a history of physical, sexual and verbal abuse. Patient was last seen on 02/20/19 when he presented with similar symptoms and met inpatient criteria at that time. Patient states he did not follow up with any OP care on discharge.    Per notes this date on arrival [facility] PA writes: Patient presents with a past medical history significant for alcohol use, bipolar, cocaine use, hepatitis C, hypertension, MI, OSA, CKD who presents for evaluation of suicide ideation.  Patient states he has plans to jump out in front of a car. Has been feeling like this for 3 days. Patient states he attempted to jump out in front of a car yesterday however they swerved and they did not hit him. He denies any his head, LOC or anticoagulation. States he has been inpatient previously. He denies any headache,  lightheadedness, dizziness, chest pain, shortness of breath abdominal pain, diarrhea, dysuria, decreased range of motion, rashes, lesions. Has been ambulatory without difficulty. States she does have a history of alcohol, cocaine and marijuana use however he has not used over the last 5 days.  No prior history of DTs or withdrawal seizures. Denies additional aggravating or relieving factors.  Denies HI.  States he occasionally hears voices however they do not tell him anything.   Patient was oriented x 4 and spoke with normal tone and volume. Patient rendered limited history and was vague in reference to symptoms. Patient's memory is intact and thoughts organized. Patient did not appear to be responding to internal stimuli.  Patient was alert throughout the assessment. Patient made poor eye contact and had abnormal psychomotor activity. Patient's affect appeared dysphoric and congruent with stated mood. Patient was not able to contract for his safety or the safety of others. Case was staffed with Jenne Campus NP who recommended patient be observed and monitored. Patient will be transferred later this date to the [facility] for continued observation.    Visit Diagnosis: MDD recurrent without psychotic features,severe, Alcohol abuse, Cocaine use     ICD-10-CM   1. Suicidal ideation  R45.851     Patient Reported Information How did you hear about [place]? Self   Referral name: No data recorded  Referral phone number: No data recorded Whom do you see for routine medical problems? I don't have a doctor   Practice/Facility Name: No data recorded  Practice/Facility Phone Number: No data recorded  Name of Contact: No data recorded  Contact Number: No data recorded  Contact Fax Number: No data recorded  Prescriber Name: No data recorded  Prescriber Address (if known): No data recorded What Is the Reason for Your Visit/Call Today? S/I SA issues  How Long Has This Been Causing You Problems? 1 wk - 1 month  Have You  Recently Been in Any Inpatient Treatment (Hospital/Detox/Crisis Center/28-Day Program)? No   Name/Location of Program/Hospital:No data recorded  How Long Were You There? No data recorded  When Were You Discharged? No data recorded Have You Ever Received Services From [facility] Before? No   Who Do You See at [facility]? No data recorded Have You Recently Had Any Thoughts About Hurting Yourself? Yes   Are You Planning to Commit Suicide/Harm Yourself At This time?  Yes  Have you Recently Had Thoughts About Hurting Someone [place] Dawn? No   Explanation: No data recorded Have You Used Any Alcohol or Drugs in the Past 24 Hours? Yes   How Long Ago Did You Use Drugs or Alcohol?  0200   What Did You Use and How Much? Unknown amount  What Do You Feel Would Help You the Most Today? Medication;Therapy  Do You Currently Have a Therapist/Psychiatrist? No   Name of Therapist/Psychiatrist: No data recorded  Have You Been Recently Discharged From Any Office Practice or Programs? No   Explanation of Discharge From Practice/Program:  No data recorded    CCA Screening Triage Referral Assessment Type of Contact: Face-to-Face   Is this Initial or Reassessment? No data recorded  Date Telepsych consult ordered in [facility]:  No data recorded  Time Telepsych consult ordered in [facility]:  No data recorded Patient Reported Information Reviewed? Yes   Patient Left Without Being Seen? No data recorded  Reason for Not Completing Assessment: No data recorded Collateral Involvement: None at this time  Does Patient Have a Court Appointed Legal Guardian? No data recorded  Name and Contact of Legal Guardian:  No data recorded If Minor and Not Living with Parent(s), Who has Custody? NA  Is CPS involved or ever been involved? Never  Is APS involved or ever been involved? Never  Patient Determined To Be At Risk for Harm To Self or Others Based on Review of Patient Reported Information or Presenting Complaint? Yes,  for Self-Harm   Method: No data recorded  Availability of Means: No data recorded  Intent: No data recorded  Notification Required: No data recorded  Additional Information for Danger to Others Potential:  No data recorded  Additional Comments for Danger to Others Potential:  No data recorded  Are There Guns or Other Weapons in Your Home?  No data recorded   Types of Guns/Weapons: No data recorded   Are These Weapons Safely Secured?                              No data recorded   Who Could Verify You Are Able To Have These Secured:    No data recorded Do You Have any Outstanding Charges, Pending Court Dates, Parole/Probation? No data recorded Contacted To Inform of Risk of Harm To Self or Others: Other: Comment (NA)  Location of Assessment: [facility]  Does Patient Present under Involuntary Commitment? No   IVC Papers Initial File Date: No data recorded  [place] of Residence: [place]  Patient Currently Receiving the Following Services: Not Receiving Services   Determination of Need: No  data recorded  Options For Referral: Outpatient Therapy   Mamie Nick, LCAS

## 2019-10-28 ENCOUNTER — Encounter (HOSPITAL_COMMUNITY): Payer: Self-pay | Admitting: Emergency Medicine

## 2019-10-28 NOTE — ED Notes (Signed)
Pt has been accepted to [facility] anytime tomorrow 10/28/2019 after 0900. Officer needs to arrive to the [facility] for COVID screen first and will then be directed where to go. This information was provided to pt's nurse, Leone Haven, at [phone].  Room: TBD Accepting: Dr. Danton Sewer Attending: Dr. Luci Bank Call to Report: [phone] or [phone]  Address: [address] [place], [place]

## 2019-10-28 NOTE — ED Notes (Signed)
Attempted to call report, no answer-will attempt at later time

## 2019-10-28 NOTE — BH Assessment (Signed)
Pt has been accepted to [facility] anytime tomorrow 10/28/2019 after 0900. Officer needs to arrive to the [facility] for COVID screen first and will then be directed where to go. This information was provided to pt's nurse, Jeremy RN, at [phone].  Room: TBD Accepting: Dr. Halimena Creque Attending: Dr. Berry Williams Call to Report: [phone] or [phone]  Address: [address] [place], [place] 

## 2019-10-28 NOTE — ED Notes (Signed)
Sheriff called for transport to [facility]-patient IVC

## 2019-10-28 NOTE — ED Notes (Signed)
Report given to [facility], RN-transport called

## 2019-10-28 NOTE — ED Notes (Signed)
Attempted to call report to [facility], no answer, will attempt at a later time

## 2020-02-20 ENCOUNTER — Emergency Department (HOSPITAL_COMMUNITY): Payer: No Typology Code available for payment source

## 2020-02-20 ENCOUNTER — Emergency Department (HOSPITAL_COMMUNITY)
Admission: EM | Admit: 2020-02-20 | Discharge: 2020-02-20 | Disposition: A | Discharge status: Home / Self Care | Payer: No Typology Code available for payment source | Attending: Emergency Medicine | Admitting: Emergency Medicine

## 2020-02-20 DIAGNOSIS — Z8659 Personal history of other mental and behavioral disorders: Secondary | ICD-10-CM | POA: Insufficient documentation

## 2020-02-20 DIAGNOSIS — R1110 Vomiting, unspecified: Secondary | ICD-10-CM | POA: Insufficient documentation

## 2020-02-20 DIAGNOSIS — S80211A Abrasion, right knee, initial encounter: Secondary | ICD-10-CM | POA: Diagnosis not present

## 2020-02-20 DIAGNOSIS — N189 Chronic kidney disease, unspecified: Secondary | ICD-10-CM | POA: Insufficient documentation

## 2020-02-20 DIAGNOSIS — Z8619 Personal history of other infectious and parasitic diseases: Secondary | ICD-10-CM | POA: Insufficient documentation

## 2020-02-20 DIAGNOSIS — R197 Diarrhea, unspecified: Secondary | ICD-10-CM

## 2020-02-20 DIAGNOSIS — F17210 Nicotine dependence, cigarettes, uncomplicated: Secondary | ICD-10-CM | POA: Insufficient documentation

## 2020-02-20 DIAGNOSIS — I129 Hypertensive chronic kidney disease with stage 1 through stage 4 chronic kidney disease, or unspecified chronic kidney disease: Secondary | ICD-10-CM | POA: Diagnosis not present

## 2020-02-20 DIAGNOSIS — S0083XA Contusion of other part of head, initial encounter: Secondary | ICD-10-CM | POA: Diagnosis present

## 2020-02-20 DIAGNOSIS — S80212A Abrasion, left knee, initial encounter: Secondary | ICD-10-CM | POA: Insufficient documentation

## 2020-02-20 DIAGNOSIS — Z20822 Contact with and (suspected) exposure to covid-19: Secondary | ICD-10-CM | POA: Insufficient documentation

## 2020-02-20 LAB — SAMPLE TO BLOOD BANK

## 2020-02-20 LAB — CBC
HCT: 42.6 % (ref 39.0–52.0)
Hemoglobin: 13.6 g/dL (ref 13.0–17.0)
MCH: 28.2 pg (ref 26.0–34.0)
MCHC: 31.9 g/dL (ref 30.0–36.0)
MCV: 88.4 fL (ref 80.0–100.0)
Platelets: 217 10*3/uL (ref 150–400)
RBC: 4.82 MIL/uL (ref 4.22–5.81)
RDW: 13.8 % (ref 11.5–15.5)
WBC: 9.7 10*3/uL (ref 4.0–10.5)
nRBC: 0 % (ref 0.0–0.2)

## 2020-02-20 LAB — COMPREHENSIVE METABOLIC PANEL
ALT: 58 U/L — ABNORMAL HIGH (ref 0–44)
AST: 37 U/L (ref 15–41)
Albumin: 3.6 g/dL (ref 3.5–5.0)
Alkaline Phosphatase: 45 U/L (ref 38–126)
Anion gap: 13 (ref 5–15)
BUN: 9 mg/dL (ref 6–20)
CO2: 22 mmol/L (ref 22–32)
Calcium: 9.4 mg/dL (ref 8.9–10.3)
Chloride: 109 mmol/L (ref 98–111)
Creatinine, Ser: 1.12 mg/dL (ref 0.61–1.24)
GFR, Estimated: >60 mL/min (ref >60)
Glucose, Bld: 95 mg/dL (ref 70–99)
Potassium: 2.8 mmol/L — ABNORMAL LOW (ref 3.5–5.1)
Sodium: 144 mmol/L (ref 135–145)
Total Bilirubin: 0.8 mg/dL (ref 0.3–1.2)
Total Protein: 6 g/dL — ABNORMAL LOW (ref 6.5–8.1)

## 2020-02-20 LAB — I-STAT CHEM 8, ED
BUN: 10 mg/dL (ref 6–20)
Calcium, Ion: 1.07 mmol/L — ABNORMAL LOW (ref 1.15–1.40)
Chloride: 106 mmol/L (ref 98–111)
Creatinine, Ser: 1 mg/dL (ref 0.61–1.24)
Glucose, Bld: 91 mg/dL (ref 70–99)
HCT: 39 % (ref 39.0–52.0)
Hemoglobin: 13.3 g/dL (ref 13.0–17.0)
Potassium: 2.8 mmol/L — ABNORMAL LOW (ref 3.5–5.1)
Sodium: 143 mmol/L (ref 135–145)
TCO2: 23 mmol/L (ref 22–32)

## 2020-02-20 LAB — RESPIRATORY PANEL BY RT PCR (FLU A&B, COVID)
Influenza A by PCR: NEGATIVE
Influenza B by PCR: NEGATIVE
SARS Coronavirus 2 by RT PCR: NEGATIVE

## 2020-02-20 LAB — PROTIME-INR
INR: 1.1 (ref 0.8–1.2)
Prothrombin Time: 14 seconds (ref 11.4–15.2)

## 2020-02-20 LAB — ETHANOL: Alcohol, Ethyl (B): <10 mg/dL (ref <10)

## 2020-02-20 LAB — LACTIC ACID, PLASMA: Lactic Acid, Venous: 3.3 mmol/L (ref 0.5–1.9)

## 2020-02-20 MED ORDER — ONDANSETRON HCL 4 MG/2ML IJ SOLN
4.0000 mg | Freq: Once | INTRAMUSCULAR | Status: AC
  Administered 2020-02-20: 4 mg via INTRAVENOUS
  Filled 2020-02-20: qty 2

## 2020-02-20 MED ORDER — POTASSIUM CHLORIDE 10 MEQ/100ML IV SOLN
10.0000 meq | Freq: Once | INTRAVENOUS | Status: AC
  Administered 2020-02-20: 10 meq via INTRAVENOUS
  Filled 2020-02-20: qty 100

## 2020-02-20 MED ORDER — POTASSIUM CHLORIDE CRYS ER 20 MEQ PO TBCR
40.0000 meq | EXTENDED_RELEASE_TABLET | Freq: Once | ORAL | Status: AC
  Administered 2020-02-20: 40 meq via ORAL
  Filled 2020-02-20: qty 2

## 2020-02-20 MED ORDER — LACTATED RINGERS IV BOLUS
1000.0000 mL | Freq: Once | INTRAVENOUS | Status: AC
  Administered 2020-02-20: 1000 mL via INTRAVENOUS

## 2020-02-20 NOTE — Discharge Instructions (Signed)
 ?  arc A Cheree Ditto:  Thank you for allowing [place] to take care of you today.  We hope you begin feeling better soon.  To-Do: Please follow-up with your primary doctor or call above to schedule an appointment with a new primary care doctor Please drink plenty of fluids, if vomiting/diarrhea continues try to drink pedialyte or gatorade  Take tylenol and ibuprofen as needed for musculoskeletal pain and soreness Please return to the Emergency Department or call 911 if you experience chest pain, shortness of breath, severe pain, severe fever, altered mental status, or have any reason to think that you need emergency medical care.  Thank you again.  Hope you feel better soon.
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
## 2020-02-20 NOTE — ED Notes (Signed)
Patient verbalized understanding of discharge instructions. Opportunity for questions and answers.  

## 2020-02-26 ENCOUNTER — Emergency Department (HOSPITAL_COMMUNITY)
Admission: EM | Admit: 2020-02-26 | Discharge: 2020-02-27 | Disposition: A | Discharge status: Home / Self Care | Payer: Medicare Other | Source: Home / Self Care | Attending: Emergency Medicine | Admitting: Emergency Medicine

## 2020-02-26 ENCOUNTER — Encounter (HOSPITAL_COMMUNITY): Payer: Self-pay

## 2020-02-26 ENCOUNTER — Other Ambulatory Visit: Payer: Self-pay

## 2020-02-26 DIAGNOSIS — R45851 Suicidal ideations: Secondary | ICD-10-CM | POA: Insufficient documentation

## 2020-02-26 DIAGNOSIS — Z7982 Long term (current) use of aspirin: Secondary | ICD-10-CM | POA: Insufficient documentation

## 2020-02-26 DIAGNOSIS — F1020 Alcohol dependence, uncomplicated: Secondary | ICD-10-CM | POA: Insufficient documentation

## 2020-02-26 DIAGNOSIS — Z79899 Other long term (current) drug therapy: Secondary | ICD-10-CM | POA: Insufficient documentation

## 2020-02-26 DIAGNOSIS — F1414 Cocaine abuse with cocaine-induced mood disorder: Secondary | ICD-10-CM | POA: Diagnosis present

## 2020-02-26 DIAGNOSIS — Z9861 Coronary angioplasty status: Secondary | ICD-10-CM | POA: Insufficient documentation

## 2020-02-26 DIAGNOSIS — F1420 Cocaine dependence, uncomplicated: Secondary | ICD-10-CM | POA: Insufficient documentation

## 2020-02-26 DIAGNOSIS — F314 Bipolar disorder, current episode depressed, severe, without psychotic features: Secondary | ICD-10-CM | POA: Diagnosis not present

## 2020-02-26 DIAGNOSIS — I129 Hypertensive chronic kidney disease with stage 1 through stage 4 chronic kidney disease, or unspecified chronic kidney disease: Secondary | ICD-10-CM | POA: Insufficient documentation

## 2020-02-26 DIAGNOSIS — N189 Chronic kidney disease, unspecified: Secondary | ICD-10-CM | POA: Insufficient documentation

## 2020-02-26 DIAGNOSIS — Z20822 Contact with and (suspected) exposure to covid-19: Secondary | ICD-10-CM | POA: Insufficient documentation

## 2020-02-26 DIAGNOSIS — F17210 Nicotine dependence, cigarettes, uncomplicated: Secondary | ICD-10-CM | POA: Insufficient documentation

## 2020-02-26 DIAGNOSIS — F332 Major depressive disorder, recurrent severe without psychotic features: Secondary | ICD-10-CM | POA: Insufficient documentation

## 2020-02-26 LAB — RAPID URINE DRUG SCREEN, HOSP PERFORMED
Amphetamines: NOT DETECTED
Barbiturates: NOT DETECTED
Benzodiazepines: NOT DETECTED
Cocaine: POSITIVE — AB
Opiates: NOT DETECTED
Tetrahydrocannabinol: NOT DETECTED

## 2020-02-26 LAB — COMPREHENSIVE METABOLIC PANEL
ALT: 34 U/L (ref 0–44)
AST: 35 U/L (ref 15–41)
Albumin: 4.1 g/dL (ref 3.5–5.0)
Alkaline Phosphatase: 52 U/L (ref 38–126)
Anion gap: 8 (ref 5–15)
BUN: 12 mg/dL (ref 6–20)
CO2: 27 mmol/L (ref 22–32)
Calcium: 8.9 mg/dL (ref 8.9–10.3)
Chloride: 107 mmol/L (ref 98–111)
Creatinine, Ser: 0.88 mg/dL (ref 0.61–1.24)
GFR, Estimated: >60 mL/min (ref >60)
Glucose, Bld: 105 mg/dL — ABNORMAL HIGH (ref 70–99)
Potassium: 3.4 mmol/L — ABNORMAL LOW (ref 3.5–5.1)
Sodium: 142 mmol/L (ref 135–145)
Total Bilirubin: 0.5 mg/dL (ref 0.3–1.2)
Total Protein: 7.1 g/dL (ref 6.5–8.1)

## 2020-02-26 LAB — CBC
HCT: 44.4 % (ref 39.0–52.0)
Hemoglobin: 15.1 g/dL (ref 13.0–17.0)
MCH: 28.7 pg (ref 26.0–34.0)
MCHC: 34 g/dL (ref 30.0–36.0)
MCV: 84.4 fL (ref 80.0–100.0)
Platelets: 268 10*3/uL (ref 150–400)
RBC: 5.26 MIL/uL (ref 4.22–5.81)
RDW: 14 % (ref 11.5–15.5)
WBC: 8.2 10*3/uL (ref 4.0–10.5)
nRBC: 0 % (ref 0.0–0.2)

## 2020-02-26 LAB — ACETAMINOPHEN LEVEL: Acetaminophen (Tylenol), Serum: <10 ug/mL — ABNORMAL LOW (ref 10–30)

## 2020-02-26 LAB — RESP PANEL BY RT-PCR (FLU A&B, COVID) ARPGX2
Influenza A by PCR: NEGATIVE
Influenza B by PCR: NEGATIVE
SARS Coronavirus 2 by RT PCR: NEGATIVE

## 2020-02-26 LAB — ETHANOL: Alcohol, Ethyl (B): <10 mg/dL (ref <10)

## 2020-02-26 LAB — SALICYLATE LEVEL: Salicylate Lvl: <7 mg/dL — ABNORMAL LOW (ref 7.0–30.0)

## 2020-02-26 NOTE — ED Notes (Addendum)
Pt has two bags, one with I pair of shoes, pants, shirt , jacket  Place  In cabinet in triage  and other bag with ect

## 2020-02-26 NOTE — ED Notes (Signed)
Assumed care of patient at this time, nad noted, sr up x2, bed locked and low, call bell w/I reach.  Will continue to monitor. ° °

## 2020-02-26 NOTE — Progress Notes (Signed)
Per Berneice Heinrich, NP patient meets inpatient criteria.  Referrals have been faxed to the following facilities:  [facility]  [facility]  [facility]  [facility]  [facility]  [facility]  [facility]  [facility] (after hours)  [facility]  [facility]   CSW to follow up.  Ladoris Gene MSW,LCSWA,LCASA Clinical Social Worker  [place] Disposition [phone] (cell)

## 2020-02-26 NOTE — ED Provider Notes (Signed)
[facility]-EMERGENCY DEPT Provider Note   CSN: [id number] Arrival date & time: 02/26/20  1156     History Chief Complaint  Patient presents with  . Suicidal    [name] is a 59 y.o. male.  59 year old male with prior medical history as detailed below presents for evaluation of reported suicidal ideation.  Patient reports that he is suicidal because of the "crack cocaine that I smoke".  Patient reports that he has a plan to jump in front of traffic.  He denies actually doing anything to harm himself.  He reports that his last use of cocaine was yesterday.  He also reports recent use of alcohol.  He is otherwise without complaint.  The history is provided by the patient and medical records.  Mental Health Problem Presenting symptoms: suicidal thoughts   Degree of incapacity (severity):  Mild Onset quality:  Gradual Duration:  1 week Timing:  Constant Progression:  Worsening Chronicity:  New Treatment compliance:  Untreated Relieved by:  Nothing      Past Medical History:  Diagnosis Date  . Alcohol abuse   . Anxiety   . Arthritis    "all over" (11/25/2015)  . Bipolar 1 disorder (HCC)   . Bipolar affective disorder (HCC)   . Chronic back pain   . Chronic kidney disease   . Cocaine abuse (HCC)    smokes crack-cocaine  . Depression   . GERD (gastroesophageal reflux disease)   . Gout   . Headache    "q couple days; stress, tension, anger" (11/25/2015)  . Hemorrhoids   . Hepatitis C    "not yet treated" (11/25/2015)  . Hypertension   . Insomnia   . Myocardial infarction [facility]) 11/2015   "supposedly" (11/25/2015)  . OSA on CPAP    "had mask; it was stolen" (11/25/2015)  . PTSD (post-traumatic stress disorder)   . Tobacco abuse   . Vitamin D deficiency     Patient Active Problem List   Diagnosis Date Noted  . 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 Suicidal ideation 02/22/2019  . Homicidal ideation 02/22/2019  . COVID-19  virus infection 02/22/2019  . Elevated liver enzymes 02/22/2019  . Elevated lipase 02/22/2019  . Abdominal pain, epigastric 12/25/2018  . Acute encephalopathy 05/25/2016  . Cocaine abuse with cocaine-induced mood disorder (HCC) 03/20/2016  . Abnormal nuclear stress test   . Palpitations 11/26/2015  . Troponin level elevated 11/26/2015  . Homeless 11/26/2015  . Essential hypertension 11/26/2015  . SVT (supraventricular tachycardia) (HCC)   . Pain in the chest 11/25/2015  . GERD (gastroesophageal reflux disease)   . Bipolar 1 disorder (HCC)   . Back pain, chronic   . Chronic kidney disease   . Affective psychosis, bipolar (HCC)   . Alcohol use disorder, severe, dependence (HCC)   . Cocaine use disorder, severe, dependence (HCC)   . Abdominal pain   . Bipolar I disorder, most recent episode depressed (HCC)   . Bipolar disorder, current episode depressed, severe, without psychotic features (HCC)   . Bipolar affective disorder, depressed, severe (HCC) 03/26/2014  . Right hip pain   . Medically noncompliant 03/23/2014  . Dysuria 03/22/2013  . Headache 03/22/2013  . Polysubstance dependence (HCC) 03/21/2012  . Chest pain 02/23/2012  . Anxiety   . Depression   . Polysubstance abuse (HCC)   . Gout   . Vitamin D deficiency   . Hepatitis C     Past Surgical History:  Procedure Laterality Date  .  CARDIAC CATHETERIZATION N/A 11/29/2015   Procedure: Left Heart Cath and Coronary Angiography;  Surgeon: Laurey Morale, MD;  Location: [facility] INVASIVE CV LAB;  Service: Cardiovascular;  Laterality: N/A;  . FRACTURE SURGERY    . PATELLA FRACTURE SURGERY Left ~ 1995  . TONSILLECTOMY         Family History  Problem Relation Age of Onset  . Hypertension Brother   . Diabetes Brother   . Hypertension Mother   . Diabetes Mother   . Arthritis Mother   . Other Sister        bone disease    Social History   Tobacco Use  . Smoking status: Current Every Day Smoker    Packs/day: 0.25     Years: 46.00    Pack years: 11.50    Types: Cigarettes  . Smokeless tobacco: Never Used  Vaping Use  . Vaping Use: Never used  Substance Use Topics  . Alcohol use: Yes  . Drug use: Yes    Types: Marijuana, "Crack" cocaine, Cocaine    Home Medications Prior to Admission medications   Medication Sig Start Date End Date Taking? Authorizing Provider  amLODipine (NORVASC) 10 MG tablet Take 1 tablet (10 mg total) by mouth daily. 03/04/19   Marinda Elk, MD  aspirin EC 81 MG tablet Take 1 tablet (81 mg total) by mouth daily. Patient not taking: Reported on 10/27/2019 03/04/19   Marinda Elk, MD  cloNIDine (CATAPRES) 0.1 MG tablet Take 1 tablet (0.1 mg total) by mouth every 6 (six) hours as needed (SBP>150). Patient taking differently: Take 0.1 mg by mouth every 6 (six) hours as needed (If blood pressure is over 150).  03/04/19   Marinda Elk, MD  clopidogrel (PLAVIX) 75 MG tablet Take 1 tablet (75 mg total) by mouth daily. 03/04/19   Marinda Elk, MD  dicyclomine (BENTYL) 20 MG tablet Take 1 tablet (20 mg total) by mouth 4 (four) times daily as needed (abdominal cramps). Patient taking differently: Take 20 mg by mouth 4 (four) times daily as needed for spasms.  03/04/19   Marinda Elk, MD  diphenhydrAMINE (BENADRYL) 25 mg capsule Take 25 mg by mouth at bedtime.    [provider]  gabapentin (NEURONTIN) 300 MG capsule Take 1 capsule (300 mg total) by mouth 3 (three) times daily. Patient taking differently: Take 300 mg by mouth 2 (two) times daily.  03/04/19   Marinda Elk, MD  gabapentin (NEURONTIN) 800 MG tablet Take 800 mg by mouth 3 (three) times daily.    [provider]  hydrocortisone (ANUSOL-HC) 2.5 % rectal cream Place 1 application rectally daily.    [provider]  Lidocaine 2 % GEL Apply 1 application topically in the morning and at bedtime.    [provider]  lisinopril (ZESTRIL) 40 MG tablet Take 1  tablet (40 mg total) by mouth daily. 03/04/19   Marinda Elk, MD  Melatonin 3 MG TABS Take 2 tablets (6 mg total) by mouth as needed. Patient not taking: Reported on 10/27/2019 03/04/19   Marinda Elk, MD  mirtazapine (REMERON) 30 MG tablet Take 30 mg by mouth at bedtime.    [provider]  OLANZapine (ZYPREXA) 10 MG tablet Take 1 tablet (10 mg total) by mouth 2 (two) times daily. 03/04/19   Marinda Elk, MD  pantoprazole (PROTONIX) 40 MG tablet Take 1 tablet (40 mg total) by mouth daily. 03/04/19   Marinda Elk, MD  pravastatin (PRAVACHOL) 20 MG tablet Take 1 tablet (20 mg total) by mouth daily. 03/04/19   Marinda Elk, MD  risperiDONE (RISPERDAL) 2 MG tablet Take 1 mg by mouth in the morning and at bedtime.    [provider]  tamsulosin (FLOMAX) 0.4 MG CAPS capsule Take 0.4 mg by mouth in the morning and at bedtime.    [provider]    Allergies    Lactulose  Review of Systems   Review of Systems  Psychiatric/Behavioral: Positive for suicidal ideas.  All other systems reviewed and are negative.   Physical Exam Updated Vital Signs BP (!) 153/104   Pulse 65   Temp 97.8 F (36.6 C) (Oral)   Resp 16   Ht 5\' 7"  (1.702 m)   Wt 74.8 kg   SpO2 98%   BMI 25.84 kg/m   Physical Exam Vitals and nursing note reviewed.  Constitutional:      General: He is not in acute distress.    Appearance: Normal appearance. He is well-developed.  HENT:     Head: Normocephalic and atraumatic.  Eyes:     Conjunctiva/sclera: Conjunctivae normal.     Pupils: Pupils are equal, round, and reactive to light.  Cardiovascular:     Rate and Rhythm: Normal rate and regular rhythm.     Heart sounds: Normal heart sounds.  Pulmonary:     Effort: Pulmonary effort is normal. No respiratory distress.     Breath sounds: Normal breath sounds.  Abdominal:     General: There is no distension.     Palpations: Abdomen is soft.     Tenderness:  There is no abdominal tenderness.  Musculoskeletal:        General: No deformity. Normal range of motion.     Cervical back: Normal range of motion and neck supple.  Skin:    General: Skin is warm and dry.  Neurological:     Mental Status: He is alert and oriented to person, place, and time.  Psychiatric:     Comments: Patient is alert and cooperative.  Patient does report suicidal ideation with plan.  Patient is not aggressive or agitated on evaluation.     ED Results / Procedures / Treatments   Labs (all labs ordered are listed, but only abnormal results are displayed) Labs Reviewed  COMPREHENSIVE METABOLIC PANEL - Abnormal; Notable for the following components:      Result Value   Potassium 3.4 (*)    Glucose, Bld 105 (*)    All other components within normal limits  SALICYLATE LEVEL - Abnormal; Notable for the following components:   Salicylate Lvl <7.0 (*)    All other components within normal limits  ACETAMINOPHEN LEVEL - Abnormal; Notable for the following components:   Acetaminophen (Tylenol), Serum <10 (*)    All other components within normal limits  RESP PANEL BY RT-PCR (FLU A&B, COVID) ARPGX2  ETHANOL  CBC  RAPID URINE DRUG SCREEN, HOSP PERFORMED    EKG None  Radiology No results found.  Procedures Procedures (including critical care time)  Medications Ordered in ED Medications - No data to display  ED Course  I have reviewed the triage vital signs and the nursing notes.  Pertinent labs & imaging results that were available during my care of the patient were reviewed by me and considered in my medical decision making (see chart for details).    MDM Rules/Calculators/A&P  MDM  Screen complete  [name] was evaluated in Emergency Department on 02/26/2020 for the symptoms described in the history of present illness. He was evaluated in the context of the global COVID-19 pandemic, which necessitated consideration that  the patient might be at risk for infection with the SARS-CoV-2 virus that causes COVID-19. Institutional protocols and algorithms that pertain to the evaluation of patients at risk for COVID-19 are in a state of rapid change based on information released by regulatory bodies including the CDC and federal and state organizations. These policies and algorithms were followed during the patient's care in the ED.  Patient is presenting for evaluation of reported SI.  Screening labs are without significant abnormality.  Patient is medically clear at this time for further psychiatric assessment and treatment.    Final Clinical Impression(s) / ED Diagnoses Final diagnoses:  Suicidal ideation    Rx / DC Orders ED Discharge Orders    None       Wynetta Fines, MD 02/26/20 [phone]

## 2020-02-26 NOTE — BH Assessment (Addendum)
Tele Assessment Note   Patient Name: [name] MRN: [id number] Referring Physician: Rodena Medin Location of Patient: [facility] ED Location of Provider: Behavioral Health TTS Department  KEMPER HUMM is an 59 y.o. male presenting voluntarily to [facility] ED reporting suicidal ideation with a plan to walk in front of a car. Patient also requesting detox from alcohol and crack cocaine. Patient states his drug/alcohol use, and his estrangement from family triggered SI. Patient denies HI/AVH. Patient receives outpatient therapy at the [facility] and states he is not currently taking any medications. Patient states he drinks 6-7 beers per day and uses $40-$50 worth of cocaine on a daily basis. Patient states he went to rehab many years ago. Patient presented to [facility] ED in July 2021 with a similar presentation and was ultimately hospitalized at [facility] afterwards. Patient denies trauma history or criminal charges. Patient states he does not have natural supports TTS can contact for collateral information.  Patient is a lert and oriented x 4. He is dressed in scrubs, sitting upright in chair and he keeps his eyes closed for the entirety of assessment. His speech is soft and organized. His mood is depressed and affect is congruent. He has limited insight, judgement, and impulse control. He does not appear to be responding to internal stimuli or experiencing delusional thought content.  Diagnosis: F33.2 MDD, recurrent, severe   F10.20 Alcohol use disorder, severe   F14.20 Cocaine use disorder, severe  Past Medical History:  Past Medical History:  Diagnosis Date  . Alcohol abuse   . Anxiety   . Arthritis    "all over" (11/25/2015)  . Bipolar 1 disorder (HCC)   . Bipolar affective disorder (HCC)   . Chronic back pain   . Chronic kidney disease   . Cocaine abuse (HCC)    smokes crack-cocaine  . Depression   . GERD (gastroesophageal reflux disease)   . Gout   . Headache    "q couple days; stress, tension,  anger" (11/25/2015)  . Hemorrhoids   . Hepatitis C    "not yet treated" (11/25/2015)  . Hypertension   . Insomnia   . Myocardial infarction [facility]) 11/2015   "supposedly" (11/25/2015)  . OSA on CPAP    "had mask; it was stolen" (11/25/2015)  . PTSD (post-traumatic stress disorder)   . Tobacco abuse   . Vitamin D deficiency     Past Surgical History:  Procedure Laterality Date  . CARDIAC CATHETERIZATION N/A 11/29/2015   Procedure: Left Heart Cath and Coronary Angiography;  Surgeon: Laurey Morale, MD;  Location: [facility] INVASIVE CV LAB;  Service: Cardiovascular;  Laterality: N/A;  . FRACTURE SURGERY    . PATELLA FRACTURE SURGERY Left ~ 1995  . TONSILLECTOMY      Family History:  Family History  Problem Relation Age of Onset  . Hypertension Brother   . Diabetes Brother   . Hypertension Mother   . Diabetes Mother   . Arthritis Mother   . Other Sister        bone disease    Social History:  reports that he has been smoking cigarettes. He has a 11.50 pack-year smoking history. He has never used smokeless tobacco. He reports current alcohol use. He reports current drug use. Drugs: Marijuana, "Crack" cocaine, and Cocaine.  Additional Social History:  Alcohol / Drug Use Pain Medications: see MAR Prescriptions: see MAR Over the Counter: see MAR History of alcohol / drug use?: Yes Substance #1 Name of Substance 1: Alcohol 1 -  Age of First Use: UTA 1 - Amount (size/oz): 6-7 beers 1 - Frequency: daily 1 - Duration: "awhile" 1 - Last Use / Amount: 11/16 6 beers Substance #2 Name of Substance 2: crack cocaine 2 - Age of First Use: UTA 2 - Amount (size/oz): $40-$50 worth 2 - Frequency: daily 2 - Duration: UTA 2 - Last Use / Amount: 11/17- $50 worth  CIWA: CIWA-Ar BP: (!) 146/95 Pulse Rate: 64 Nausea and Vomiting: mild nausea with no vomiting Tactile Disturbances: none Tremor: no tremor Auditory Disturbances: not present Paroxysmal Sweats: no sweat visible Visual Disturbances:  not present Anxiety: mildly anxious Headache, Fullness in Head: moderately severe Agitation: normal activity Orientation and Clouding of Sensorium: oriented and can do serial additions CIWA-Ar Total: 6 COWS:    Allergies:  Allergies  Allergen Reactions  . Lactulose Hives and Nausea Only    Home Medications: (Not in a hospital admission)   OB/GYN Status:  No LMP for male patient.  General Assessment Data Location of Assessment: [facility] Assessment Services TTS Assessment: In system Is this a Tele or Face-to-Face Assessment?: Tele Assessment Is this an Initial Assessment or a Re-assessment for this encounter?: Initial Assessment Patient Accompanied by:: N/A Language Other than English: No Living Arrangements:  (motel) What gender do you identify as?: Male Date Telepsych consult ordered in [facility]: 02/26/20 Time Telepsych consult ordered in [facility]: 1332 Marital status: Single Pregnancy Status: No Living Arrangements: Alone Can pt return to current living arrangement?: Yes Admission Status: Voluntary Is patient capable of signing voluntary admission?: Yes Referral Source: Self/Family/Friend Insurance type: Medicare     Crisis Care Plan Living Arrangements: Alone Legal Guardian:  (self) Name of Psychiatrist: [name] Name of Therapist: [name]  Education Status Is patient currently in school?: No Is the patient employed, unemployed or receiving disability?: Receiving disability income  Risk to self with the past 6 months Suicidal Ideation: Yes-Currently Present Has patient been a risk to self within the past 6 months prior to admission? : Yes Suicidal Intent: Yes-Currently Present Has patient had any suicidal intent within the past 6 months prior to admission? : Yes Is patient at risk for suicide?: Yes Suicidal Plan?: Yes-Currently Present Has patient had any suicidal plan within the past 6 months prior to admission? : Yes Specify Current Suicidal Plan: walk in  front of a car Access to Means: Yes Specify Access to Suicidal Means: ability to do so What has been your use of drugs/alcohol within the last 12 months?: daily alcohol and crack cocaine use Previous Attempts/Gestures: Yes How many times?:  (UTA) Other Self Harm Risks: denies Triggers for Past Attempts: Unknown Intentional Self Injurious Behavior: None Family Suicide History: No Recent stressful life event(s): Financial Problems, Other (Comment) (housing) Persecutory voices/beliefs?: No Depression: Yes Depression Symptoms: Despondent, Insomnia, Tearfulness, Isolating, Fatigue, Loss of interest in usual pleasures, Guilt, Feeling worthless/self pity, Feeling angry/irritable Substance abuse history and/or treatment for substance abuse?: Yes Suicide prevention information given to non-admitted patients: Not applicable  Risk to Others within the past 6 months Homicidal Ideation: No Does patient have any lifetime risk of violence toward others beyond the six months prior to admission? : No Thoughts of Harm to Others: No Current Homicidal Intent: No Current Homicidal Plan: No Access to Homicidal Means: No Identified Victim: denies History of harm to others?: No Assessment of Violence: None Noted Violent Behavior Description: none noted Does patient have access to weapons?: No Criminal Charges Pending?: No Does patient have a court date: No Is  patient on probation?: No  Psychosis Hallucinations: None noted Delusions: None noted  Mental Status Report Appearance/Hygiene: Unremarkable, In scrubs Eye Contact: Poor Motor Activity: Freedom of movement Speech: Soft, Slow Level of Consciousness: Quiet/awake Mood: Depressed Affect: Depressed Anxiety Level: Minimal Thought Processes: Coherent, Relevant Judgement: Partial Orientation: Person, Place, Time, Situation Obsessive Compulsive Thoughts/Behaviors: None  Cognitive Functioning Concentration: Normal Memory: Recent Intact,  Remote Intact Is patient IDD: No Insight: Poor Impulse Control: Fair Appetite: Poor Have you had any weight changes? : No Change Sleep: Decreased Total Hours of Sleep:  (UTA)  ADLScreening [facility] Assessment Services) Patient's cognitive ability adequate to safely complete daily activities?: No Patient able to express need for assistance with ADLs?: Yes Independently performs ADLs?: Yes (appropriate for developmental age)  Prior Inpatient Therapy Prior Inpatient Therapy: Yes Prior Therapy Dates: 2021, 2020, 2018, 2014 Prior Therapy Facilty/Provider(s): [facility] and others Reason for Treatment: suicidal ideation, polysubstance use  Prior Outpatient Therapy Prior Outpatient Therapy: Yes Prior Therapy Dates: ongoing Prior Therapy Facilty/Provider(s): [facility] [name] Reason for Treatment: therapy Does patient have an ACCT team?: No Does patient have Intensive In-House Services?  : No Does patient have [facility] services? : No Does patient have P4CC services?: No  ADL Screening (condition at time of admission) Patient's cognitive ability adequate to safely complete daily activities?: No Is the patient deaf or have difficulty hearing?: No Does the patient have difficulty seeing, even when wearing glasses/contacts?: No Does the patient have difficulty concentrating, remembering, or making decisions?: No Patient able to express need for assistance with ADLs?: Yes Does the patient have difficulty dressing or bathing?: No Independently performs ADLs?: Yes (appropriate for developmental age) Does the patient have difficulty walking or climbing stairs?: No Weakness of Arms/Hands: None  Home Assistive Devices/Equipment Home Assistive Devices/Equipment: None  Therapy Consults (therapy consults require a physician order) PT Evaluation Needed: No OT Evalulation Needed: No SLP Evaluation Needed: No Abuse/Neglect Assessment (Assessment to be complete while patient is alone) Abuse/Neglect  Assessment Can Be Completed: Yes Physical Abuse: Denies Verbal Abuse: Denies Sexual Abuse: Denies Exploitation of patient/patient's resources: Denies Self-Neglect: Denies Values / Beliefs Cultural Requests During Hospitalization: None Spiritual Requests During Hospitalization: None Consults Spiritual Care Consult Needed: No Transition of Care Team Consult Needed: No Advance Directives (For Healthcare) Does Patient Have a Medical Advance Directive?: No Would patient like information on creating a medical advance directive?: No - Patient declined          Disposition: Pending Disposition Initial Assessment Completed for this Encounter: Yes  This service was provided via telemedicine using a 2-way, interactive audio and video technology.  Names of all persons participating in this telemedicine service and their role in this encounter. Name: Taveon Enyeart Role: patient  Name: Celedonio Miyamoto, LCSW Role: TTS  Name:  Role:   Name:  Role:     Celedonio Miyamoto 02/26/2020 3:34 PM

## 2020-02-26 NOTE — ED Notes (Signed)
Pt has 3 bags of belongings. Placed in 9-12 patient cabinet.

## 2020-02-26 NOTE — ED Notes (Addendum)
Patient belongings in cabinet in triage. Has two bags that contains a pair of shoes, pants, shirt, jacket, etc.

## 2020-02-26 NOTE — BHH Counselor (Addendum)
Berneice Heinrich, FNP recommends in patient treatment. Janice Coffin, MA notified of disposition.

## 2020-02-26 NOTE — ED Triage Notes (Signed)
Patient states he is suicidal. Patient states his plan is to jump out in front of a car. Patient also reports that he wants detox from alcohol and cocaine. Patient states he drank 6-7 beers yesterday and cocaine last night.  Patient denies HI, visual or auditory hallucinations.

## 2020-02-27 ENCOUNTER — Inpatient Hospital Stay (HOSPITAL_COMMUNITY)
Admission: AD | Admit: 2020-02-27 | Discharge: 2020-03-09 | DRG: 885 | Disposition: A | Discharge status: Home / Self Care | Payer: Medicare Other | Source: Intra-hospital | Attending: Psychiatry | Admitting: Psychiatry

## 2020-02-27 ENCOUNTER — Encounter (HOSPITAL_COMMUNITY): Payer: Self-pay

## 2020-02-27 ENCOUNTER — Other Ambulatory Visit: Payer: Self-pay

## 2020-02-27 ENCOUNTER — Telehealth: Payer: Self-pay

## 2020-02-27 DIAGNOSIS — Z79899 Other long term (current) drug therapy: Secondary | ICD-10-CM

## 2020-02-27 DIAGNOSIS — E559 Vitamin D deficiency, unspecified: Secondary | ICD-10-CM | POA: Diagnosis present

## 2020-02-27 DIAGNOSIS — Z638 Other specified problems related to primary support group: Secondary | ICD-10-CM | POA: Diagnosis not present

## 2020-02-27 DIAGNOSIS — F1414 Cocaine abuse with cocaine-induced mood disorder: Secondary | ICD-10-CM | POA: Diagnosis present

## 2020-02-27 DIAGNOSIS — I2510 Atherosclerotic heart disease of native coronary artery without angina pectoris: Secondary | ICD-10-CM | POA: Diagnosis not present

## 2020-02-27 DIAGNOSIS — Z20822 Contact with and (suspected) exposure to covid-19: Secondary | ICD-10-CM | POA: Diagnosis present

## 2020-02-27 DIAGNOSIS — Z833 Family history of diabetes mellitus: Secondary | ICD-10-CM

## 2020-02-27 DIAGNOSIS — B1920 Unspecified viral hepatitis C without hepatic coma: Secondary | ICD-10-CM | POA: Diagnosis present

## 2020-02-27 DIAGNOSIS — Z23 Encounter for immunization: Secondary | ICD-10-CM | POA: Diagnosis present

## 2020-02-27 DIAGNOSIS — K5900 Constipation, unspecified: Secondary | ICD-10-CM | POA: Diagnosis not present

## 2020-02-27 DIAGNOSIS — N189 Chronic kidney disease, unspecified: Secondary | ICD-10-CM | POA: Diagnosis not present

## 2020-02-27 DIAGNOSIS — I252 Old myocardial infarction: Secondary | ICD-10-CM | POA: Diagnosis not present

## 2020-02-27 DIAGNOSIS — Z8616 Personal history of COVID-19: Secondary | ICD-10-CM

## 2020-02-27 DIAGNOSIS — F17210 Nicotine dependence, cigarettes, uncomplicated: Secondary | ICD-10-CM | POA: Diagnosis present

## 2020-02-27 DIAGNOSIS — M549 Dorsalgia, unspecified: Secondary | ICD-10-CM | POA: Diagnosis present

## 2020-02-27 DIAGNOSIS — F419 Anxiety disorder, unspecified: Secondary | ICD-10-CM | POA: Diagnosis present

## 2020-02-27 DIAGNOSIS — R45851 Suicidal ideations: Secondary | ICD-10-CM

## 2020-02-27 DIAGNOSIS — Z7902 Long term (current) use of antithrombotics/antiplatelets: Secondary | ICD-10-CM

## 2020-02-27 DIAGNOSIS — K219 Gastro-esophageal reflux disease without esophagitis: Secondary | ICD-10-CM | POA: Diagnosis present

## 2020-02-27 DIAGNOSIS — Z9119 Patient's noncompliance with other medical treatment and regimen: Secondary | ICD-10-CM

## 2020-02-27 DIAGNOSIS — G8929 Other chronic pain: Secondary | ICD-10-CM | POA: Diagnosis present

## 2020-02-27 DIAGNOSIS — F1494 Cocaine use, unspecified with cocaine-induced mood disorder: Secondary | ICD-10-CM | POA: Diagnosis not present

## 2020-02-27 DIAGNOSIS — R7303 Prediabetes: Secondary | ICD-10-CM | POA: Diagnosis present

## 2020-02-27 DIAGNOSIS — R1084 Generalized abdominal pain: Secondary | ICD-10-CM | POA: Diagnosis not present

## 2020-02-27 DIAGNOSIS — E119 Type 2 diabetes mellitus without complications: Secondary | ICD-10-CM | POA: Diagnosis not present

## 2020-02-27 DIAGNOSIS — F1020 Alcohol dependence, uncomplicated: Secondary | ICD-10-CM | POA: Diagnosis present

## 2020-02-27 DIAGNOSIS — Z8261 Family history of arthritis: Secondary | ICD-10-CM

## 2020-02-27 DIAGNOSIS — F1410 Cocaine abuse, uncomplicated: Secondary | ICD-10-CM | POA: Diagnosis not present

## 2020-02-27 DIAGNOSIS — Z5900 Homelessness unspecified: Secondary | ICD-10-CM

## 2020-02-27 DIAGNOSIS — F1910 Other psychoactive substance abuse, uncomplicated: Secondary | ICD-10-CM | POA: Diagnosis present

## 2020-02-27 DIAGNOSIS — E876 Hypokalemia: Secondary | ICD-10-CM | POA: Diagnosis not present

## 2020-02-27 DIAGNOSIS — E785 Hyperlipidemia, unspecified: Secondary | ICD-10-CM | POA: Diagnosis present

## 2020-02-27 DIAGNOSIS — Z91199 Patient's noncompliance with other medical treatment and regimen due to unspecified reason: Secondary | ICD-10-CM

## 2020-02-27 DIAGNOSIS — Z8249 Family history of ischemic heart disease and other diseases of the circulatory system: Secondary | ICD-10-CM

## 2020-02-27 DIAGNOSIS — R109 Unspecified abdominal pain: Secondary | ICD-10-CM | POA: Diagnosis present

## 2020-02-27 DIAGNOSIS — F1594 Other stimulant use, unspecified with stimulant-induced mood disorder: Secondary | ICD-10-CM | POA: Diagnosis present

## 2020-02-27 DIAGNOSIS — G4700 Insomnia, unspecified: Secondary | ICD-10-CM | POA: Diagnosis present

## 2020-02-27 DIAGNOSIS — F314 Bipolar disorder, current episode depressed, severe, without psychotic features: Principal | ICD-10-CM | POA: Diagnosis present

## 2020-02-27 DIAGNOSIS — R739 Hyperglycemia, unspecified: Secondary | ICD-10-CM | POA: Diagnosis present

## 2020-02-27 DIAGNOSIS — G4733 Obstructive sleep apnea (adult) (pediatric): Secondary | ICD-10-CM | POA: Diagnosis present

## 2020-02-27 DIAGNOSIS — Z7982 Long term (current) use of aspirin: Secondary | ICD-10-CM

## 2020-02-27 DIAGNOSIS — I10 Essential (primary) hypertension: Secondary | ICD-10-CM | POA: Diagnosis present

## 2020-02-27 DIAGNOSIS — I129 Hypertensive chronic kidney disease with stage 1 through stage 4 chronic kidney disease, or unspecified chronic kidney disease: Secondary | ICD-10-CM | POA: Diagnosis not present

## 2020-02-27 DIAGNOSIS — Z5901 Sheltered homelessness: Secondary | ICD-10-CM

## 2020-02-27 MED ORDER — MAGNESIUM HYDROXIDE 400 MG/5ML PO SUSP: 30.0000 mL | Freq: Every day | ORAL | Status: DC | PRN

## 2020-02-27 MED ORDER — ALUM & MAG HYDROXIDE-SIMETH 200-200-20 MG/5ML PO SUSP: 30.0000 mL | ORAL | Status: DC | PRN

## 2020-02-27 MED ORDER — ASPIRIN EC 81 MG PO TBEC
81.0000 mg | DELAYED_RELEASE_TABLET | Freq: Every day | ORAL | Status: DC
  Administered 2020-02-27 – 2020-03-09 (×12): 81 mg via ORAL
  Filled 2020-02-27 (×16): qty 1

## 2020-02-27 MED ORDER — TRAZODONE HCL 50 MG PO TABS
50.0000 mg | ORAL_TABLET | Freq: Every evening | ORAL | Status: DC | PRN
  Administered 2020-02-27 – 2020-03-08 (×10): 50 mg via ORAL
  Filled 2020-02-27 (×11): qty 1

## 2020-02-27 MED ORDER — INFLUENZA VAC A&B SA ADJ QUAD 0.5 ML IM PRSY
0.5000 mL | PREFILLED_SYRINGE | INTRAMUSCULAR | Status: DC
  Filled 2020-02-27: qty 0.5

## 2020-02-27 MED ORDER — OLANZAPINE 10 MG PO TABS
10.0000 mg | ORAL_TABLET | Freq: Every day | ORAL | Status: DC
  Administered 2020-02-27 – 2020-03-01 (×4): 10 mg via ORAL
  Filled 2020-02-27 (×6): qty 1

## 2020-02-27 MED ORDER — HYDROXYZINE HCL 25 MG PO TABS
25.0000 mg | ORAL_TABLET | Freq: Three times a day (TID) | ORAL | Status: DC | PRN
  Administered 2020-02-27 – 2020-03-08 (×8): 25 mg via ORAL
  Filled 2020-02-27 (×8): qty 1

## 2020-02-27 MED ORDER — AMLODIPINE BESYLATE 10 MG PO TABS
10.0000 mg | ORAL_TABLET | Freq: Every day | ORAL | Status: DC
  Administered 2020-02-27 – 2020-03-09 (×12): 10 mg via ORAL
  Filled 2020-02-27 (×12): qty 1
  Filled 2020-02-27: qty 2
  Filled 2020-02-27 (×2): qty 1

## 2020-02-27 MED ORDER — ACETAMINOPHEN 325 MG PO TABS
650.0000 mg | ORAL_TABLET | Freq: Four times a day (QID) | ORAL | Status: DC | PRN
  Administered 2020-03-08: 650 mg via ORAL
  Filled 2020-02-27: qty 2

## 2020-02-27 MED ORDER — PRAVASTATIN SODIUM 10 MG PO TABS
20.0000 mg | ORAL_TABLET | Freq: Every day | ORAL | Status: DC
  Administered 2020-02-27 – 2020-03-09 (×12): 20 mg via ORAL
  Filled 2020-02-27 (×6): qty 2
  Filled 2020-02-27: qty 1
  Filled 2020-02-27 (×5): qty 2
  Filled 2020-02-27 (×2): qty 1
  Filled 2020-02-27: qty 2

## 2020-02-27 MED ORDER — LISINOPRIL 20 MG PO TABS
40.0000 mg | ORAL_TABLET | Freq: Every day | ORAL | Status: DC
  Administered 2020-02-27 – 2020-03-09 (×12): 40 mg via ORAL
  Filled 2020-02-27 (×15): qty 2

## 2020-02-27 NOTE — Progress Notes (Signed)
Patient ID: [name], male   DOB: 04-13-1960, 59 y.o.   MRN: [id number] Patient presents voluntarily secondary to increased depression, anxiety and suicidal thoughts and plan. Has been thinking about jumping out of a running car. Reports that he has been feeling depressed for a while secondary to declining health well as his living conditions. Reports that he he is currently homeless and has been using alcohol and crack/cocain increasingly. Reports that he has no support system: he is single with no children and his relatives "have nothing to do with me". 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 He is pleasant and cooperative during this assessment reporting that he is hungry and tired. Skin assessment performed by Clinical research associate assisted by nurse Herbert Seta. No skin concerns noted. `Admitted and oriented to the unit. Safety precautions initiated.

## 2020-02-27 NOTE — Progress Notes (Signed)
Pt forwards little information this evening, pt appeared paranoid at times     02/27/20 2300  Psych Admission Type (Psych Patients Only)  Admission Status Voluntary  Psychosocial Assessment  Patient Complaints Anxiety;Depression  Eye Contact Brief  Facial Expression Anxious;Sad  Affect Anxious;Depressed;Sad  Speech Logical/coherent  Interaction Guarded  Motor Activity Fidgety  Appearance/Hygiene Poor hygiene  Behavior Characteristics Anxious;Guarded  Mood Suspicious;Depressed  Thought Process  Coherency WDL  Content WDL  Delusions WDL  Perception WDL  Hallucination None reported or observed  Judgment WDL  Confusion WDL  Danger to Self  Current suicidal ideation? Verbalizes  Self-Injurious Behavior No self-injurious ideation or behavior indicators observed or expressed   Agreement Not to Harm Self Yes  Description of Agreement verbally  Danger to Others  Danger to Others None reported or observed

## 2020-02-27 NOTE — Plan of Care (Signed)
Newly admitted and adjusting well to the unit. Was oriented to the unit. Meals provided and medications administered as prescribed. Patient contracted for safety. Expressing worries related to housing conditions: currently homeless, which triggers his depression and substance use. Patient is otherwise cooperative and appropriate on the unit. Emotional support provided. Safety monitored as recommended.

## 2020-02-27 NOTE — Tx Team (Signed)
Initial Treatment Plan 02/27/2020 6:14 PM [name] TDS:[id number]    PATIENT STRESSORS: Financial difficulties Health problems Medication change or noncompliance Substance abuse   PATIENT STRENGTHS: Ability for insight Communication skills General fund of knowledge   PATIENT IDENTIFIED PROBLEMS: Depression  Anxiety  Suicidal ideations  Substance use  Declining physical health  Housing issues           DISCHARGE CRITERIA:  Adequate post-discharge living arrangements Improved stabilization in mood, thinking, and/or behavior Medical problems require only outpatient monitoring Motivation to continue treatment in a less acute level of care Verbal commitment to aftercare and medication compliance  PRELIMINARY DISCHARGE PLAN: Outpatient therapy Placement in alternative living arrangements  PATIENT/FAMILY INVOLVEMENT: This treatment plan has been presented to and reviewed with the patient, [name].  The patient has been given the opportunity to ask questions and make suggestions.  Olin Pia, RN 02/27/2020, 6:14 PM

## 2020-02-27 NOTE — BH Assessment (Signed)
[facility] Assessment Progress Note  Per Berneice Heinrich, NP, this pt requires psychiatric hospitalization at this time.  Brook McNichol, RN, [facility] has assigned pt to [facility] Rm [id number]; [facility] will be ready to receive pt at 13:00.  Pt has signed Voluntary Admission and Consent for Treatment, as well as Consent to Release Information to the [facility], and a notification call has been placed.  Signed forms have been faxed to [facility].  EDP Arby Barrette, MD and pt's nurse, Silvio Pate, have been notified, and Silvio Pate agrees to send original paperwork along with pt via [employer], and to call report to [phone].  Doylene Canning, [place] [facility] Coordinator [phone]

## 2020-02-27 NOTE — Progress Notes (Signed)
Patient did not attend the evening speaker AA meeting. Pt was notified that group was beginning but remained in bed.   

## 2020-02-28 MED ORDER — ESCITALOPRAM OXALATE 5 MG PO TABS
5.0000 mg | ORAL_TABLET | Freq: Every day | ORAL | Status: DC
  Administered 2020-02-28 – 2020-03-01 (×3): 5 mg via ORAL
  Filled 2020-02-28 (×5): qty 1

## 2020-02-28 MED ORDER — CLONIDINE HCL 0.1 MG PO TABS
0.1000 mg | ORAL_TABLET | Freq: Three times a day (TID) | ORAL | Status: DC | PRN
  Administered 2020-02-29: 0.1 mg via ORAL

## 2020-02-28 MED ORDER — INFLUENZA VAC SPLIT QUAD 0.5 ML IM SUSY
0.5000 mL | PREFILLED_SYRINGE | INTRAMUSCULAR | Status: AC
  Administered 2020-02-28: 0.5 mL via INTRAMUSCULAR
  Filled 2020-02-28: qty 0.5

## 2020-02-28 MED ORDER — GABAPENTIN 300 MG PO CAPS
300.0000 mg | ORAL_CAPSULE | Freq: Three times a day (TID) | ORAL | Status: DC
  Administered 2020-02-28 – 2020-03-09 (×31): 300 mg via ORAL
  Filled 2020-02-28 (×37): qty 1

## 2020-02-28 MED ORDER — CLOPIDOGREL BISULFATE 75 MG PO TABS
75.0000 mg | ORAL_TABLET | Freq: Every day | ORAL | Status: DC
  Administered 2020-02-28 – 2020-03-09 (×11): 75 mg via ORAL
  Filled 2020-02-28 (×15): qty 1

## 2020-02-28 MED ORDER — LORAZEPAM 1 MG PO TABS
1.0000 mg | ORAL_TABLET | Freq: Four times a day (QID) | ORAL | Status: DC | PRN
  Administered 2020-02-28 (×2): 1 mg via ORAL
  Filled 2020-02-28 (×2): qty 1

## 2020-02-28 MED ORDER — FOLIC ACID 1 MG PO TABS
1.0000 mg | ORAL_TABLET | Freq: Every day | ORAL | Status: DC
  Administered 2020-02-28 – 2020-03-09 (×11): 1 mg via ORAL
  Filled 2020-02-28 (×14): qty 1

## 2020-02-28 MED ORDER — PANTOPRAZOLE SODIUM 40 MG PO TBEC
40.0000 mg | DELAYED_RELEASE_TABLET | Freq: Every day | ORAL | Status: DC
  Administered 2020-02-28 – 2020-03-09 (×11): 40 mg via ORAL
  Filled 2020-02-28 (×14): qty 1

## 2020-02-28 MED ORDER — THIAMINE HCL 100 MG PO TABS
100.0000 mg | ORAL_TABLET | Freq: Every day | ORAL | Status: DC
  Administered 2020-02-28 – 2020-03-09 (×11): 100 mg via ORAL
  Filled 2020-02-28 (×14): qty 1

## 2020-02-28 MED ORDER — POTASSIUM CHLORIDE CRYS ER 20 MEQ PO TBCR
20.0000 meq | EXTENDED_RELEASE_TABLET | Freq: Once | ORAL | Status: AC
  Administered 2020-02-28: 20 meq via ORAL
  Filled 2020-02-28 (×2): qty 1

## 2020-02-28 NOTE — Progress Notes (Signed)
Psychoeducational Group Note  Date:  02/28/2020 Time:  2030  Group Topic/Focus:  wrap up group  Participation Level: Did Not Attend  Participation Quality:  Not Applicable  Affect:  Not Applicable  Cognitive:  Not Applicable  Insight:  Not Applicable  Engagement in Group: Not Applicable  Additional Comments:  Pt was notified that group was beginning but remained in bed.   Marcille Buffy 02/28/2020, 9:56 PM

## 2020-02-28 NOTE — [facility] Suicide Risk Assessment (Signed)
[facility] Admission Suicide Risk Assessment   Nursing information obtained from:  Patient Demographic factors:  Male, Living alone, Unemployed Current Mental Status:  Suicidal ideation indicated by patient, Plan includes specific time, place, or method Loss Factors:  Decline in physical health, Decrease in vocational status Historical Factors:  NA Risk Reduction Factors:  NA  Total Time spent with patient: 30 minutes Principal Problem: <principal problem not specified> Diagnosis:  Active Problems:   Cocaine-induced mood disorder (HCC)  Subjective Data: Patient is seen and examined.  Patient is a 59 year old male with a past psychiatric history significant for bipolar disorder, alcohol dependence and cocaine dependence who presented to the [facility] emergency department on 02/26/2020 requesting admission secondary to suicidal ideation.  The patient stated that his plan was to jump out in front of a car.  The patient stated that he wanted detox from alcohol and cocaine.  The patient stated that he had drank 6-7 beers the day prior to admission as well as use cocaine.  He told me today that he had gotten out of rehab approximately 4 months ago.  He stated he had been sober until just a few days ago.  He stated that he is estranged from his family which had triggered his suicidal ideation.  The patient is a Statistician.  And apparently he receives outpatient therapy at [facility].  He stated today that he has not been to the clinic in several months.  He stated he is not been on his psychiatric medications in several months.  He is unclear as to why that occurred.  He apparently prior to being in rehab he had presented to the [facility] on 10/27/2019.  He was transferred from the [facility] to the behavioral health urgent care center, and then placed under involuntary commitment and sent to old [place].  He  went to substance residential treatment after that.  His medical and psychiatric medications that were present in July of this year included amlodipine, clonidine, Plavix, Bentyl, lisinopril, Zyprexa, Protonix, pravastatin, coated aspirin and melatonin.  He was admitted to the hospital for evaluation and stabilization.  He had also been at [facility] in March 2020.  His list of psychiatric medications at that time included Lexapro, Seroquel, gabapentin and melatonin.  He admitted to helplessness, hopelessness and worthlessness as well as suicidal ideation.  Continued Clinical Symptoms:  Alcohol Use Disorder Identification Test Final Score (AUDIT): 22 The "Alcohol Use Disorders Identification Test", Guidelines for Use in Primary Care, Second Edition.  World Science writer [facility]). Score between 0-7:  no or low risk or alcohol related problems. Score between 8-15:  moderate risk of alcohol related problems. Score between 16-19:  high risk of alcohol related problems. Score 20 or above:  warrants further diagnostic evaluation for alcohol dependence and treatment.   CLINICAL FACTORS:   Bipolar Disorder:   Depressive phase Depression:   Anhedonia Comorbid alcohol abuse/dependence Hopelessness Impulsivity Insomnia Alcohol/Substance Abuse/Dependencies More than one psychiatric diagnosis Previous Psychiatric Diagnoses and Treatments Medical Diagnoses and Treatments/Surgeries   Musculoskeletal: Strength & Muscle Tone: within normal limits Gait & Station: normal Patient leans: N/A  Psychiatric Specialty Exam: Physical Exam Vitals and nursing note reviewed.  Constitutional:      Appearance: Normal appearance.  HENT:     Head: Normocephalic and atraumatic.  Pulmonary:     Effort: Pulmonary effort is normal.  Neurological:     General: No focal deficit present.  Mental Status: He is alert and oriented to person, place, and time.     Review of Systems  Blood pressure (!)  136/99, pulse (!) 105, temperature 97.6 F (36.4 C), temperature source Oral, resp. rate 16, height 5\' 7"  (1.702 m), weight 78 kg, SpO2 100 %.Body mass index is 26.94 kg/m.  General Appearance: Casual  Eye Contact:  Minimal  Speech:  Normal Rate  Volume:  Decreased  Mood:  Anxious and Depressed  Affect:  Congruent  Thought Process:  Coherent and Descriptions of Associations: Intact  Orientation:  Full (Time, Place, and Person)  Thought Content:  Logical  Suicidal Thoughts:  Yes.  with intent/plan  Homicidal Thoughts:  No  Memory:  Immediate;   Fair Recent;   Fair Remote;   Fair  Judgement:  Intact  Insight:  Lacking  Psychomotor Activity:  Increased  Concentration:  Concentration: Fair and Attention Span: Fair  Recall:  of Knowledge:  Fair  Language:  Good  Akathisia:  Negative  Handed:  Right  AIMS (if indicated):     Assets:  Desire for Improvement Resilience  ADL's:  Intact  Cognition:  WNL  Sleep:         COGNITIVE FEATURES THAT CONTRIBUTE TO RISK:  None    SUICIDE RISK:   Moderate:  Frequent suicidal ideation with limited intensity, and duration, some specificity in terms of plans, no associated intent, good self-control, limited dysphoria/symptomatology, some risk factors present, and identifiable protective factors, including available and accessible social support.  PLAN OF CARE: Patient is seen and examined.  Patient is a 59 year old male with the above-stated past psychiatric history who was admitted secondary to worsening depression, suicidal ideation as well as relapse on substances.  He will be admitted to the hospital.  He will be integrated in the milieu.  He will be encouraged to attend groups.  We will go on and restart his Zyprexa as well as the citalopram.  I will also restart his medications for heart disease as well.  He stated he has 2 stents, and a history of hypertension, and we will monitor that throughout the course of the  hospitalization.  On admission he did not have any alcohol in his system and his liver function enzymes were normal, but I will put in with lorazepam 1 mg p.o. every 6 hours as needed a CIWA greater than 10.  We will go on and start folic acid as well as thiamine.  Review of his admission laboratories revealed a mildly low potassium at 3.4.  That will be supplemented.  His liver function enzymes were normal, his creatinine was normal at 0.88.  CBC was normal.  MCV was normal.  His PT was 14.0 and his INR was 1.1.  Acetaminophen was less than 10, salicylate less than 7.  Blood alcohol was less than 10.  Drug screen was positive for cocaine.  His blood pressure is mildly elevated this morning at 136/99.  Pulse was 105.  Respirations were 16.  He is afebrile.  We do not have a recent EKG, and I will order that especially given the fact we are starting the Zyprexa back.  I certify that inpatient services furnished can reasonably be expected to improve the patient's condition.   46, MD 02/28/2020, 11:19 AM

## 2020-02-28 NOTE — Progress Notes (Signed)
[facility] NOVEL CORONAVIRUS (COVID-19) DAILY CHECK-OFF SYMPTOMS - answer yes or no to each - every day NO YES  Have you had a fever in the past 24 hours?  . Fever (Temp > 37.80C / 100F) X   Have you had any of these symptoms in the past 24 hours? . New Cough .  Sore Throat  .  Shortness of Breath .  Difficulty Breathing .  Unexplained Body Aches   X   Have you had any one of these symptoms in the past 24 hours not related to allergies?   . Runny Nose .  Nasal Congestion .  Sneezing   X   If you have had runny nose, nasal congestion, sneezing in the past 24 hours, has it worsened?  X   EXPOSURES - check yes or no X   Have you traveled outside the state in the past 14 days?  X   Have you been in contact with someone with a confirmed diagnosis of COVID-19 or PUI in the past 14 days without wearing appropriate PPE?  X   Have you been living in the same home as a person with confirmed diagnosis of COVID-19 or a PUI (household contact)?    X   Have you been diagnosed with COVID-19?    X              What to do next: Answered NO to all: Answered YES to anything:   Proceed with unit schedule Follow the [facility] Inpatient Flowsheet.   

## 2020-02-28 NOTE — Progress Notes (Signed)
EKG results placed on the outside of pt's shadow chart.   Normal sinus rhythm Normal ECG  QT/QTc  416/445 ms

## 2020-02-28 NOTE — H&P (Signed)
Psychiatric Admission Assessment Adult  Patient Identification: [name] MRN:  [id number] Date of Evaluation:  02/28/2020 Chief Complaint:  Cocaine-induced mood disorder (HCC) [F14.94] Principal Diagnosis: Bipolar disorder, current episode depressed, severe, without psychotic features (HCC) Diagnosis:  Principal Problem:   Bipolar disorder, current episode depressed, severe, without psychotic features (HCC) Active Problems:   Polysubstance abuse (HCC)   Suicidal ideation   Cocaine-induced mood disorder (HCC)  History of Present Illness:  Per BH Assessment: "[name] is an 59 y.o. male presenting voluntarily to [facility] ED reporting suicidal ideation with a plan to walk in front of a car. Patient also requesting detox from alcohol and crack cocaine. Patient states his drug/alcohol use, and his estrangement from family triggered SI. Patient denies HI/AVH. Patient receives outpatient therapy at the [facility] and states he is not currently taking any medications. Patient states he drinks 6-7 beers per day and uses $40-$50 worth of cocaine on a daily basis. Patient states he went to rehab many years ago. Patient presented to [facility] ED in July 2021 with a similar presentation and was ultimately hospitalized at [facility] afterwards. Patient denies trauma history or criminal charges. Patient states he does not have natural supports TTS can contact for collateral information."  He reports that he is still depressed with low energy. He reports that he still has SI with plans to run into traffic. He reports no HI and no AVH. He reports that approximately 2 weeks ago he had a manic episode that lasted about 1 week, that was when his depressive symptoms and SI started which has been persistent.   He reports that he last used Crack-cocaine 2 days ago, THC 1 week ago, and EtOH 3 days ago.   Associated Signs/Symptoms: Depression Symptoms:  depressed mood, anhedonia, insomnia, fatigue, suicidal  thoughts with specific plan, loss of energy/fatigue, Duration of Depression Symptoms: No data recorded (Hypo) Manic Symptoms:  None at present Anxiety Symptoms:  None at present Psychotic Symptoms:  None at present Duration of Psychotic Symptoms: No data recorded PTSD Symptoms: None at present Total Time spent with patient: 30 minutes  Past Psychiatric History: Bipolar 1, Polysubstance abuse, and substance-induced mood disorder.  Is the patient at risk to self? Yes.    Has the patient been a risk to self in the past 6 months? Yes.    Has the patient been a risk to self within the distant past? Yes.    Is the patient a risk to others? No.  Has the patient been a risk to others in the past 6 months? No.  Has the patient been a risk to others within the distant past? Yes.     Prior Inpatient Therapy:   Prior Outpatient Therapy:    Alcohol Screening: 1. How often do you have a drink containing alcohol?: 4 or more times a week 2. How many drinks containing alcohol do you have on a typical day when you are drinking?: 10 or more 3. How often do you have six or more drinks on one occasion?: Daily or almost daily AUDIT-C Score: 12 4. How often during the last year have you found that you were not able to stop drinking once you had started?: Daily or almost daily 5. How often during the last year have you failed to do what was normally expected from you because of drinking?: Less than monthly 6. How often during the last year have you needed a first drink in the morning to get yourself going  after a heavy drinking session?: Daily or almost daily 7. How often during the last year have you had a feeling of guilt of remorse after drinking?: Less than monthly 8. How often during the last year have you been unable to remember what happened the night before because you had been drinking?: Never 9. Have you or someone else been injured as a result of your drinking?: No 10. Has a relative or friend or  a doctor or another health worker been concerned about your drinking or suggested you cut down?: No Alcohol Use Disorder Identification Test Final Score (AUDIT): 22 Alcohol Brief Interventions/Follow-up: Brief Advice, Continued Monitoring, Alcohol Education Substance Abuse History in the last 12 months:  Yes.   Consequences of Substance Abuse: Family Consequences:  Estranged from family Previous Psychotropic Medications: Yes  Psychological Evaluations: Yes  Past Medical History:  Past Medical History:  Diagnosis Date  . Alcohol abuse   . Anxiety   . Arthritis    "all over" (11/25/2015)  . Bipolar 1 disorder (HCC)   . Bipolar affective disorder (HCC)   . Chronic back pain   . Chronic kidney disease   . Cocaine abuse (HCC)    smokes crack-cocaine  . Depression   . GERD (gastroesophageal reflux disease)   . Gout   . Headache    "q couple days; stress, tension, anger" (11/25/2015)  . Hemorrhoids   . Hepatitis C    "not yet treated" (11/25/2015)  . Hypertension   . Insomnia   . Myocardial infarction [facility]) 11/2015   "supposedly" (11/25/2015)  . OSA on CPAP    "had mask; it was stolen" (11/25/2015)  . PTSD (post-traumatic stress disorder)   . Tobacco abuse   . Vitamin D deficiency     Past Surgical History:  Procedure Laterality Date  . CARDIAC CATHETERIZATION N/A 11/29/2015   Procedure: Left Heart Cath and Coronary Angiography;  Surgeon: Laurey Morale, MD;  Location: [facility] INVASIVE CV LAB;  Service: Cardiovascular;  Laterality: N/A;  . FRACTURE SURGERY    . PATELLA FRACTURE SURGERY Left ~ 1995  . TONSILLECTOMY     Family History:  Family History  Problem Relation Age of Onset  . Hypertension Brother   . Diabetes Brother   . Hypertension Mother   . Diabetes Mother   . Arthritis Mother   . Other Sister        bone disease   Family Psychiatric  History: No Known Tobacco Screening: Have you used any form of tobacco in the last 30 days? (Cigarettes, Smokeless Tobacco, Cigars,  and/or Pipes): No Social History:  Social History   Substance and Sexual Activity  Alcohol Use Yes     Social History   Substance and Sexual Activity  Drug Use Yes  . Types: Marijuana, "Crack" cocaine, Cocaine    Additional Social History:                           Allergies:   Allergies  Allergen Reactions  . Lactulose Hives and Nausea Only   Lab Results:  Results for orders placed or performed during the hospital encounter of 02/26/20 (from the past 48 hour(s))  Comprehensive metabolic panel     Status: Abnormal   Collection Time: 02/26/20 12:45 PM  Result Value Ref Range   Sodium 142 135 - 145 mmol/L   Potassium 3.4 (L) 3.5 - 5.1 mmol/L   Chloride 107 98 - 111 mmol/L   CO2 27  22 - 32 mmol/L   Glucose, Bld 105 (H) 70 - 99 mg/dL    Comment: Glucose reference range applies only to samples taken after fasting for at least 8 hours.   BUN 12 6 - 20 mg/dL   Creatinine, Ser 9.76 0.61 - 1.24 mg/dL   Calcium 8.9 8.9 - 73.4 mg/dL   Total Protein 7.1 6.5 - 8.1 g/dL   Albumin 4.1 3.5 - 5.0 g/dL   AST 35 15 - 41 U/L   ALT 34 0 - 44 U/L   Alkaline Phosphatase 52 38 - 126 U/L   Total Bilirubin 0.5 0.3 - 1.2 mg/dL   GFR, Estimated >19 >37 mL/min    Comment: (NOTE) Calculated using the CKD-EPI Creatinine Equation (2021)    Anion gap 8 5 - 15    Comment: Performed at [facility], [address] [place], [place] [address]  Ethanol     Status: None   Collection Time: 02/26/20 12:45 PM  Result Value Ref Range   Alcohol, Ethyl (B) <10 <10 mg/dL    Comment: (NOTE) Lowest detectable limit for serum alcohol is 10 mg/dL.  For medical purposes only. Performed at [facility], [address], [place], [place] [address]   Salicylate level     Status: Abnormal   Collection Time: 02/26/20 12:45 PM  Result Value Ref Range   Salicylate Lvl <7.0 (L) 7.0 - 30.0 mg/dL    Comment: Performed at [facility], [address] [place], [place] [address]  Acetaminophen level     Status: Abnormal   Collection Time: 02/26/20 12:45 PM  Result Value Ref Range   Acetaminophen (Tylenol), Serum <10 (L) 10 - 30 ug/mL    Comment: (NOTE) Therapeutic concentrations vary significantly. A range of 10-30 ug/mL  may be an effective concentration for many patients. However, some  are best treated at concentrations outside of this range. Acetaminophen concentrations >150 ug/mL at 4 hours after ingestion  and >50 ug/mL at 12 hours after ingestion are often associated with  toxic reactions.  Performed at [facility], [address], [place], [place] [address]   cbc     Status: None   Collection Time: 02/26/20 12:45 PM  Result Value Ref Range   WBC 8.2 4.0 - 10.5 K/uL   RBC 5.26 4.22 - 5.81 MIL/uL   Hemoglobin 15.1 13.0 - 17.0 g/dL   HCT 19.6 39 - 52 %   MCV 84.4 80.0 - 100.0 fL   MCH 28.7 26.0 - 34.0 pg   MCHC 34.0 30.0 - 36.0 g/dL   RDW 22.2 97.9 - 89.2 %   Platelets 268 150 - 400 K/uL   nRBC 0.0 0.0 - 0.2 %    Comment: Performed at [facility], [address], [place], [place] [address]  Resp Panel by RT-PCR (Flu A&B, Covid) Nasopharyngeal Swab     Status: None   Collection Time: 02/26/20  3:03 PM   Specimen: Nasopharyngeal Swab; Nasopharyngeal(NP) swabs in vial transport medium  Result Value Ref Range   SARS Coronavirus 2 by RT PCR NEGATIVE NEGATIVE    Comment: (NOTE) SARS-CoV-2 target nucleic acids are NOT DETECTED.  The SARS-CoV-2 RNA is generally detectable in upper respiratory specimens during the acute phase of infection. The lowest concentration of SARS-CoV-2 viral copies this assay can detect is 138 copies/mL. A negative result does not preclude SARS-Cov-2 infection and should not be used as the sole basis for treatment  or other patient management decisions. A negative result may occur with  improper specimen collection/handling, submission of specimen  other than nasopharyngeal swab, presence of viral mutation(s) within the areas targeted by this assay, and inadequate number of viral copies(<138 copies/mL). A negative result must be combined with clinical observations, patient history, and epidemiological information. The expected result is Negative.  Fact Sheet for Patients:  BloggerCourse.com  Fact Sheet for Healthcare Providers:  SeriousBroker.it  This test is no t yet approved or cleared by the [place] FDA and  has been authorized for detection and/or diagnosis of SARS-CoV-2 by FDA under an Emergency Use Authorization (EUA). This EUA will remain  in effect (meaning this test can be used) for the duration of the COVID-19 declaration under Section 564(b)(1) of the Act, 21 U.S.C.section 360bbb-3(b)(1), unless the authorization is terminated  or revoked sooner.       Influenza A by PCR NEGATIVE NEGATIVE   Influenza B by PCR NEGATIVE NEGATIVE    Comment: (NOTE) The Xpert Xpress SARS-CoV-2/FLU/RSV plus assay is intended as an aid in the diagnosis of influenza from Nasopharyngeal swab specimens and should not be used as a sole basis for treatment. Nasal washings and aspirates are unacceptable for Xpert Xpress SARS-CoV-2/FLU/RSV testing.  Fact Sheet for Patients: BloggerCourse.com  Fact Sheet for Healthcare Providers: SeriousBroker.it  This test is not yet approved or cleared by the [place] FDA and has been authorized for detection and/or diagnosis of SARS-CoV-2 by FDA under an Emergency Use Authorization (EUA). This EUA will remain in effect (meaning this test can be used) for the duration of the COVID-19 declaration under Section 564(b)(1) of the Act, 21 U.S.C. section 360bbb-3(b)(1), unless the authorization is terminated or revoked.  Performed at [facility], [address] [place], [place] [address]   Rapid urine drug screen (hospital performed)     Status: Abnormal   Collection Time: 02/26/20  4:15 PM  Result Value Ref Range   Opiates NONE DETECTED NONE DETECTED   Cocaine POSITIVE (A) NONE DETECTED   Benzodiazepines NONE DETECTED NONE DETECTED   Amphetamines NONE DETECTED NONE DETECTED   Tetrahydrocannabinol NONE DETECTED NONE DETECTED   Barbiturates NONE DETECTED NONE DETECTED    Comment: (NOTE) DRUG SCREEN FOR MEDICAL PURPOSES ONLY.  IF CONFIRMATION IS NEEDED FOR ANY PURPOSE, NOTIFY LAB WITHIN 5 DAYS.  LOWEST DETECTABLE LIMITS FOR URINE DRUG SCREEN Drug Class                     Cutoff (ng/mL) Amphetamine and metabolites    1000 Barbiturate and metabolites    200 Benzodiazepine                 200 Tricyclics and metabolites     300 Opiates and metabolites        300 Cocaine and metabolites        300 THC                            50 Performed at [facility], [address] [place], [place] [address]     Blood Alcohol level:  Lab Results  Component Value Date   [facility] <10 02/26/2020   ETH <10 02/20/2020    Metabolic Disorder Labs:  Lab Results  Component Value Date   HGBA1C 5.8 (H) 11/25/2015   MPG 120 11/25/2015   MPG 137 (H) 03/23/2014   No results found for: PROLACTIN  Lab Results  Component Value Date   CHOL 130 11/26/2015   TRIG 84 11/26/2015   HDL 33 (L) 11/26/2015   CHOLHDL 3.9 11/26/2015   VLDL 17 11/26/2015   LDLCALC 80 11/26/2015   LDLCALC 97 03/23/2014    Current Medications: Current Facility-Administered Medications  Medication Dose Route Frequency Provider Last Rate Last Admin  . acetaminophen (TYLENOL) tablet 650 mg  650 mg Oral Q6H PRN Antonieta Pert, MD      . alum & mag hydroxide-simeth (MAALOX/MYLANTA) 200-200-20 MG/5ML suspension 30 mL  30 mL Oral Q4H PRN Antonieta Pert, MD      . amLODipine (NORVASC) tablet 10 mg  10 mg Oral Daily Antonieta Pert, MD   10 mg at 02/28/20  0935  . aspirin EC tablet 81 mg  81 mg Oral Daily Antonieta Pert, MD   81 mg at 02/28/20 0935  . cloNIDine (CATAPRES) tablet 0.1 mg  0.1 mg Oral TID PRN Antonieta Pert, MD      . clopidogrel (PLAVIX) tablet 75 mg  75 mg Oral Daily Antonieta Pert, MD      . escitalopram (LEXAPRO) tablet 5 mg  5 mg Oral Daily Antonieta Pert, MD      . folic acid (FOLVITE) tablet 1 mg  1 mg Oral Daily Antonieta Pert, MD      . gabapentin (NEURONTIN) capsule 300 mg  300 mg Oral TID Antonieta Pert, MD      . hydrOXYzine (ATARAX/VISTARIL) tablet 25 mg  25 mg Oral TID PRN Antonieta Pert, MD   25 mg at 02/27/20 2158  . lisinopril (ZESTRIL) tablet 40 mg  40 mg Oral Daily Antonieta Pert, MD   40 mg at 02/28/20 0935  . LORazepam (ATIVAN) tablet 1 mg  1 mg Oral Q6H PRN Antonieta Pert, MD      . magnesium hydroxide (MILK OF MAGNESIA) suspension 30 mL  30 mL Oral Daily PRN Antonieta Pert, MD      . OLANZapine [facility]) tablet 10 mg  10 mg Oral QHS Antonieta Pert, MD   10 mg at 02/27/20 2157  . pantoprazole (PROTONIX) EC tablet 40 mg  40 mg Oral Daily Antonieta Pert, MD      . potassium chloride SA (KLOR-CON) CR tablet 20 mEq  20 mEq Oral Once Antonieta Pert, MD      . pravastatin (PRAVACHOL) tablet 20 mg  20 mg Oral Daily Antonieta Pert, MD   20 mg at 02/28/20 0936  . thiamine tablet 100 mg  100 mg Oral Daily Antonieta Pert, MD      . traZODone (DESYREL) tablet 50 mg  50 mg Oral QHS PRN Antonieta Pert, MD   50 mg at 02/27/20 2157   PTA Medications: Medications Prior to Admission  Medication Sig Dispense Refill Last Dose  . amLODipine (NORVASC) 10 MG tablet Take 1 tablet (10 mg total) by mouth daily. (Patient not taking: Reported on 02/26/2020) 30 tablet 0   . aspirin EC 81 MG tablet Take 1 tablet (81 mg total) by mouth daily. (Patient not taking: Reported on 10/27/2019) 30 tablet 3   . clopidogrel (PLAVIX) 75 MG tablet Take 1 tablet (75 mg total) by mouth daily.  (Patient not taking: Reported on 02/26/2020) 30 tablet 0   . lisinopril (ZESTRIL) 40 MG tablet Take 1 tablet (40 mg total) by mouth daily. (Patient not taking: Reported on 02/26/2020) 30 tablet 0   .  OLANZapine (ZYPREXA) 10 MG tablet Take 1 tablet (10 mg total) by mouth 2 (two) times daily. (Patient not taking: Reported on 02/26/2020) 60 tablet 3   . pravastatin (PRAVACHOL) 20 MG tablet Take 1 tablet (20 mg total) by mouth daily. (Patient not taking: Reported on 02/26/2020) 30 tablet 3     Musculoskeletal: Strength & Muscle Tone: within normal limits Gait & Station: In bed during interview Patient leans: N/A  Psychiatric Specialty Exam: Physical Exam Vitals and nursing note reviewed.  Constitutional:      General: He is not in acute distress.    Appearance: Normal appearance. He is normal weight. He is not ill-appearing, toxic-appearing or diaphoretic.  HENT:     Head: Normocephalic and atraumatic.  Cardiovascular:     Rate and Rhythm: Tachycardia present.  Pulmonary:     Effort: Pulmonary effort is normal.  Neurological:     Mental Status: He is alert.     Review of Systems  Constitutional: Positive for fatigue. Negative for fever.  Respiratory: Negative for chest tightness and shortness of breath.   Cardiovascular: Negative for chest pain and palpitations.  Gastrointestinal: Positive for diarrhea. Negative for abdominal pain, constipation, nausea and vomiting.  Neurological: Positive for headaches. Negative for dizziness, weakness and light-headedness.  Psychiatric/Behavioral: Positive for dysphoric mood and suicidal ideas. Negative for agitation, behavioral problems, hallucinations, self-injury and sleep disturbance. The patient is not nervous/anxious and is not hyperactive.     Blood pressure (!) 136/99, pulse (!) 105, temperature 97.6 F (36.4 C), temperature source Oral, resp. rate 16, height 5\' 7"  (1.702 m), weight 78 kg, SpO2 100 %.Body mass index is 26.94 kg/m.  General  Appearance: Fairly Groomed and in scrubs  Eye Contact:  Poor  Speech:  Clear and Coherent and Slow  Volume:  Decreased  Mood:  Depressed and Dysphoric  Affect:  Depressed and Flat  Thought Process:  Coherent  Orientation:  Full (Time, Place, and Person)  Thought Content:  Logical  Suicidal Thoughts:  No  Homicidal Thoughts:  No  Memory:  Immediate;   Fair Recent;   Fair  Judgement:  Fair  Insight:  Fair  Psychomotor Activity:  Normal  Concentration:  Concentration: Fair  Recall:  of Knowledge:  Fair  Language:  Good  Akathisia:  No  Handed:  Right  AIMS (if indicated):     Assets:  Resilience  ADL's:  Intact  Cognition:  WNL  Sleep:       Treatment Plan Summary: Daily contact with patient to assess and evaluate symptoms and progress in treatment  Have restarted medications per chart review as he does not remember what he was taking. Will monitor for response. Will place on CIWA to monitor for withdrawal.  -Lexapro 5 mg daily -Zyprexa 10 mg QHS -Gabapentin 300 mg TID  Restarted other chronic medications -Amlodipine 10 mg daily -Plavix 75 mg daily -Lisinopril 40 mg daily -Protonix 40 mg daily -Pravastatin 20 mg daily  For EtOH abuse and withdrawal -Ativan 1 mg q6 PRN CIWA>10 -Thiamine 100 mg daily -Folic acid 1 mg daily   Observation Level/Precautions:  15 minute checks  Laboratory:  CBC: WNL  A1C. TSH, CMP, and Lipid Panel ordered  Psychotherapy:    Medications:    Consultations:    Discharge Concerns:    Estimated LOS: 2-4 days  Other:     Physician Treatment Plan for Primary Diagnosis: Bipolar disorder, current episode depressed, severe, without psychotic features (HCC) Long Term Goal(s): Improvement in  symptoms so as ready for discharge  Short Term Goals: Ability to identify changes in lifestyle to reduce recurrence of condition will improve, Ability to verbalize feelings will improve, Ability to disclose and discuss suicidal ideas, Ability  to demonstrate self-control will improve, Ability to identify and develop effective coping behaviors will improve, Compliance with prescribed medications will improve and Ability to identify triggers associated with substance abuse/mental health issues will improve  Physician Treatment Plan for Secondary Diagnosis: Principal Problem:   Bipolar disorder, current episode depressed, severe, without psychotic features (HCC) Active Problems:   Polysubstance abuse (HCC)   Suicidal ideation   Cocaine-induced mood disorder (HCC)  Long Term Goal(s): Improvement in symptoms so as ready for discharge  Short Term Goals: Ability to identify changes in lifestyle to reduce recurrence of condition will improve, Ability to verbalize feelings will improve, Ability to disclose and discuss suicidal ideas, Ability to demonstrate self-control will improve, Ability to identify and develop effective coping behaviors will improve, Compliance with prescribed medications will improve and Ability to identify triggers associated with substance abuse/mental health issues will improve  I certify that inpatient services furnished can reasonably be expected to improve the patient's condition.    Lauro Franklin, MD 11/20/202112:38 PM

## 2020-02-28 NOTE — Progress Notes (Signed)
   02/28/20 2258  COVID-19 Daily Checkoff  Have you had a fever (temp > 37.80C/100F)  in the past 24 hours?  No  If you have had runny nose, nasal congestion, sneezing in the past 24 hours, has it worsened? No  COVID-19 EXPOSURE  Have you traveled outside the state in the past 14 days? No  Have you been in contact with someone with a confirmed diagnosis of COVID-19 or PUI in the past 14 days without wearing appropriate PPE? No  Have you been living in the same home as a person with confirmed diagnosis of COVID-19 or a PUI (household contact)? No  Have you been diagnosed with COVID-19? No

## 2020-02-28 NOTE — Progress Notes (Signed)
   02/28/20 1100  Psych Admission Type (Psych Patients Only)  Admission Status Voluntary  Psychosocial Assessment  Patient Complaints Anxiety;Depression  Eye Contact Brief  Facial Expression Anxious;Sad  Affect Anxious;Depressed;Sad  Speech Logical/coherent  Interaction Guarded  Motor Activity Fidgety  Appearance/Hygiene Poor hygiene  Behavior Characteristics Cooperative;Anxious  Mood Depressed  Thought Process  Coherency WDL  Content WDL  Delusions WDL  Perception WDL  Hallucination None reported or observed  Judgment WDL  Confusion WDL  Danger to Self  Current suicidal ideation? Passive  Self-Injurious Behavior No self-injurious ideation or behavior indicators observed or expressed   Agreement Not to Harm Self Yes  Description of Agreement verbally  Danger to Others  Danger to Others None reported or observed

## 2020-02-28 NOTE — Progress Notes (Signed)
   02/28/20 2301  Psych Admission Type (Psych Patients Only)  Admission Status Voluntary  Psychosocial Assessment  Patient Complaints Substance abuse;Depression  Eye Contact Brief  Facial Expression Anxious  Affect Appropriate to circumstance  Speech Logical/coherent;Soft  Interaction Guarded;Minimal  Motor Activity Slow  Appearance/Hygiene Disheveled  Behavior Characteristics Appropriate to situation  Mood Depressed  Thought Process  Coherency WDL  Content WDL  Delusions None reported or observed  Perception WDL  Hallucination None reported or observed  Judgment Impaired  Confusion None  Danger to Self  Current suicidal ideation? Denies  Self-Injurious Behavior No self-injurious ideation or behavior indicators observed or expressed   Agreement Not to Harm Self Yes  Description of Agreement verbally  Danger to Others  Danger to Others None reported or observed

## 2020-02-28 NOTE — [facility] Suicide Risk Assessment (Signed)
[facility] INPATIENT:  Family/Significant Other Suicide Prevention Education  Suicide Prevention Education:  Patient Refusal for Family/Significant Other Suicide Prevention Education: The patient [name] has refused to provide written consent for family/significant other to be provided Family/Significant Other Suicide Prevention Education during admission and/or prior to discharge.  Physician notified.  Suicide Prevention Education was reviewed thoroughly with patient, including risk factors, warning signs, and what to do.  Mobile Crisis services were described and that telephone number pointed out, with encouragement to patient to put this number in personal cell phone.  Brochure was provided to patient to share with natural supports.  Patient acknowledged the ways in which they are at risk, and how working through each of their issues can gradually start to reduce their risk factors.  Patient was encouraged to think of the information in the context of people in their own lives.  Patient denied having access to firearms  Patient verbalized understanding of information provided.       [name] 02/28/2020, 1:55 PM

## 2020-02-28 NOTE — [facility] Counselor (Signed)
Adult Comprehensive Assessment  Patient ID: [name], male   DOB: 1961/01/13, 59 y.o.   MRN: [id number]  Information Source: Information source: Patient  Current Stressors:  Patient states their primary concerns and needs for treatment are:: Suicidal thoughts Patient states their goals for this hospitilization and ongoing recovery are:: "Try to find somewhere to stay after I leave here and stay off the drugs." Educational / Learning stressors: Denies stressors Employment / Job issues: Is on disability Family Relationships: Family members are not talking to him. Financial / Lack of resources (include bankruptcy): Denies stressors Housing / Lack of housing: Lost hotel room, has nowhere to go. Physical health (include injuries & life threatening diseases): Stomach, head problems Social relationships: Has nobody to socialize with, does not make friends and family members are not talking to him. Substance abuse: Very stressful. Bereavement / Loss: Denies stressors, previously talked in 2016 about losing a daughter to SIDS and a fiancee  Living/Environment/Situation:  Living Arrangements: Alone Living conditions (as described by patient or guardian): Hotel room Who else lives in the home?: Alone How long has patient lived in current situation?: 1 month What is atmosphere in current home: Comfortable, Temporary  Family History:  Marital status: Single Does patient have children?: Yes How many children?: 1 How is patient's relationship with their children?: Infant daughter died of SIDS about 11 years ago.  Childhood History:  By whom was/is the patient raised?: Mother Additional childhood history information: No relationship with father Description of patient's relationship with caregiver when they were a child: Good with mother; no relationship with father. Patient's description of current relationship with people who raised him/her: Mother - "somewhat" will talk to her on the phone;  Does not know if father is living. How were you disciplined when you got in trouble as a child/adolescent?: Spanked Does patient have siblings?: Yes Number of Siblings: 3 Description of patient's current relationship with siblings: They are not talking to him. Did patient suffer any verbal/emotional/physical/sexual abuse as a child?: No Did patient suffer from severe childhood neglect?: No Has patient ever been sexually abused/assaulted/raped as an adolescent or adult?: No Was the patient ever a victim of a crime or a disaster?: No Witnessed domestic violence?: No Has patient been affected by domestic violence as an adult?: No  Education:  Highest grade of school patient has completed: 2 years of college Currently a Consulting civil engineer?: No Learning disability?: No  Employment/Work Situation:   Employment situation: On disability Why is patient on disability: mental health How long has patient been on disability: 4 years What is the longest time patient has a held a job?: on and off for a couple of years 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 Has patient ever been in the [employer]?: Yes (Describe in comment) ([employer] from 1980-1982, is eligible for services from the [name])  Financial Resources:   Financial resources: [employer], Medicare Does patient have a Lawyer or guardian?: No  Alcohol/Substance Abuse:   What has been your use of drugs/alcohol within the last 12 months?: Alcohol and crack cocaine daily; Marijuana about once monthly If yes, describe treatment: [facility], [facility], [facility] Has alcohol/substance abuse ever caused legal problems?: No  Social Support System:   Forensic psychologist System: None Type of faith/religion: None How does patient's faith help to cope with current illness?: N/A  Leisure/Recreation:   Do You Have Hobbies?: Yes Leisure and Hobbies: watching movies  Strengths/Needs:   What is the  patient's  perception of their strengths?: Reliability Patient states they can use these personal strengths during their treatment to contribute to their recovery: "I don't know" Patient states these barriers may affect/interfere with their treatment: None Patient states these barriers may affect their return to the community: Has nowhere to go. Other important information patient would like considered in planning for their treatment: None  Discharge Plan:   Currently receiving community mental health services: No Patient states concerns and preferences for aftercare planning are: Would like appointments at the [facility] (will be the first time there) for medication and therapy. Patient states they will know when they are safe and ready for discharge when: Doctors will inform him. Does patient have access to transportation?: No Does patient have financial barriers related to discharge medications?: No Plan for no access to transportation at discharge: Will need bus. Plan for living situation after discharge: Needs assistance locating somewhere to go.  Has lived in [name] before (about 5 years ago), would consider again, and does ask for a list of availabilities, which was provided. Will patient be returning to same living situation after discharge?: No  Summary/Recommendations:   Summary and Recommendations (to be completed by the evaluator): Patient is a 59yo male admitted with suicidal ideation with a plan to walk in front of a car, also requesting detox from alcohol and crack cocaine that he has been using daily (6-7 beers per day and $40-50 cocaine per day).  Primary stressors are drug/alcohol addictions and his estrangement from family.  He reports that he wants to get connected with the [facility] for medication management and therapy.  He is on disability and was living in a hotel for the last month but was kicked out because he had someone else staying with him.  He has lived in an  [address] previously and was open to receiving a list of vacancies, which was provided.  The patient was previously been at [facility], was at [facility] in July 2021, has previously gone to [facility] and [facility] for rehab.  Patient will benefit from crisis stabilization, medication evaluation, group therapy and psychoeducation, in addition to case management for discharge planning.  At discharge it is recommended that Patient adhere to the established discharge plan and continue in treatment.  Lynnell Chad. 02/28/2020

## 2020-02-29 DIAGNOSIS — F314 Bipolar disorder, current episode depressed, severe, without psychotic features: Secondary | ICD-10-CM | POA: Diagnosis not present

## 2020-02-29 LAB — HEMOGLOBIN A1C
Hgb A1c MFr Bld: 5.8 % — ABNORMAL HIGH (ref 4.8–5.6)
Mean Plasma Glucose: 119.76 mg/dL

## 2020-02-29 LAB — COMPREHENSIVE METABOLIC PANEL
ALT: 25 U/L (ref 0–44)
AST: 20 U/L (ref 15–41)
Albumin: 4.1 g/dL (ref 3.5–5.0)
Alkaline Phosphatase: 54 U/L (ref 38–126)
Anion gap: 10 (ref 5–15)
BUN: 12 mg/dL (ref 6–20)
CO2: 25 mmol/L (ref 22–32)
Calcium: 8.9 mg/dL (ref 8.9–10.3)
Chloride: 106 mmol/L (ref 98–111)
Creatinine, Ser: 1.14 mg/dL (ref 0.61–1.24)
GFR, Estimated: >60 mL/min (ref >60)
Glucose, Bld: 85 mg/dL (ref 70–99)
Potassium: 4 mmol/L (ref 3.5–5.1)
Sodium: 141 mmol/L (ref 135–145)
Total Bilirubin: 0.5 mg/dL (ref 0.3–1.2)
Total Protein: 7.2 g/dL (ref 6.5–8.1)

## 2020-02-29 LAB — LIPID PANEL
Cholesterol: 154 mg/dL (ref 0–200)
HDL: 50 mg/dL (ref >40)
LDL Cholesterol: 80 mg/dL (ref 0–99)
Total CHOL/HDL Ratio: 3.1 RATIO
Triglycerides: 121 mg/dL (ref <150)
VLDL: 24 mg/dL (ref 0–40)

## 2020-02-29 LAB — TSH: TSH: 1.209 u[IU]/mL (ref 0.350–4.500)

## 2020-02-29 MED ORDER — CLONIDINE HCL 0.1 MG PO TABS
0.1000 mg | ORAL_TABLET | Freq: Two times a day (BID) | ORAL | Status: DC
  Administered 2020-02-29 – 2020-03-09 (×18): 0.1 mg via ORAL
  Filled 2020-02-29 (×22): qty 1

## 2020-02-29 NOTE — Progress Notes (Addendum)
   02/29/20 1400  Psych Admission Type (Psych Patients Only)  Admission Status Voluntary  Psychosocial Assessment  Patient Complaints Depression  Eye Contact Brief  Facial Expression Anxious  Affect Appropriate to circumstance  Speech Logical/coherent;Soft  Interaction Guarded;Minimal  Motor Activity Slow  Appearance/Hygiene Disheveled  Behavior Characteristics Appropriate to situation  Mood Depressed  Thought Process  Coherency WDL  Content WDL  Delusions None reported or observed  Perception WDL  Hallucination None reported or observed  Judgment Impaired  Confusion None  Danger to Self  Current suicidal ideation? Denies  Self-Injurious Behavior No self-injurious ideation or behavior indicators observed or expressed   Agreement Not to Harm Self Yes  Description of Agreement verbally  Danger to Others  Danger to Others None reported or observed    Pt presents as depressed, guarded, but has been calm and cooperative. Pt remained in bed for most of the shift- did not attend groups despite encouragement to come. Pt did walk down for dinner, and remained in the dayroom afterward sitting quietly watching football. Pt currently denies SI/HI and A/V H.

## 2020-02-29 NOTE — Progress Notes (Signed)
[place] NOVEL CORONAVIRUS (COVID-19) DAILY CHECK-OFF SYMPTOMS - answer yes or no to each - every day NO YES  Have you had a fever in the past 24 hours?  . Fever (Temp > 37.80C / 100F) X   Have you had any of these symptoms in the past 24 hours? . New Cough .  Sore Throat  .  Shortness of Breath .  Difficulty Breathing .  Unexplained Body Aches   X   Have you had any one of these symptoms in the past 24 hours not related to allergies?   . Runny Nose .  Nasal Congestion .  Sneezing   X   If you have had runny nose, nasal congestion, sneezing in the past 24 hours, has it worsened?  X   EXPOSURES - check yes or no X   Have you traveled outside the state in the past 14 days?  X   Have you been in contact with someone with a confirmed diagnosis of COVID-19 or PUI in the past 14 days without wearing appropriate PPE?  X   Have you been living in the same home as a person with confirmed diagnosis of COVID-19 or a PUI (household contact)?    X   Have you been diagnosed with COVID-19?    X              What to do next: Answered NO to all: Answered YES to anything:   Proceed with unit schedule Follow the [facility] Inpatient Flowsheet.   

## 2020-02-29 NOTE — Progress Notes (Signed)
[facility] Group Notes:  (Nursing/MHT/Case Management/Adjunct)  Date:  02/29/2020  Time:  2030  Type of Therapy:  wrap up group  Participation Level:  Minimal  Participation Quality:  Appropriate and Sharing  Affect:  Depressed and Flat  Cognitive:  Appropriate  Insight:  Improving  Engagement in Group:  Improving  Modes of Intervention:  Clarification, Education and Support  Summary of Progress/Problems: Pt entered group the last five minutes but did participate by answering questions. Positive thinking and self-care were discussed.   Marcille Buffy 02/29/2020, 11:25 PM

## 2020-02-29 NOTE — [facility] Group Notes (Signed)
[facility] Group Notes: (Clinical Social Work)   02/29/2020      Type of Therapy:  Group Therapy   Participation Level:  Did Not Attend - was invited both individually by MHT and by overhead announcement, chose not to attend.   Jory Welke Grossman-Orr, LCSW 02/29/2020, 12:03 PM    

## 2020-02-29 NOTE — [facility] Group Notes (Signed)
Adult Psychoeducational Group Not Date:  02/29/2020 Time:  0900-1045 Group Topic/Focus: PROGRESSIVE RELAXATION. A group where deep breathing is taught and tensing and relaxation muscle groups is used. Imagery is used as well.  Pts are asked to imagine 3 pillars that hold them up when they are not able to hold themselves up.  Participation Level:  Did not attend   Kewana Sanon A 02/29/2020`  

## 2020-02-29 NOTE — Progress Notes (Signed)
   02/29/20 2315  COVID-19 Daily Checkoff  Have you had a fever (temp > 37.80C/100F)  in the past 24 hours?  No  If you have had runny nose, nasal congestion, sneezing in the past 24 hours, has it worsened? No  COVID-19 EXPOSURE  Have you traveled outside the state in the past 14 days? No  Have you been in contact with someone with a confirmed diagnosis of COVID-19 or PUI in the past 14 days without wearing appropriate PPE? No  Have you been living in the same home as a person with confirmed diagnosis of COVID-19 or a PUI (household contact)? No  Have you been diagnosed with COVID-19? No

## 2020-02-29 NOTE — Progress Notes (Signed)
[facility] MD Progress Note  02/29/2020 9:53 AM [name]  MRN:  [id number]   Subjective:   "I slept so-so and my appetite is so-so."  59 year old male who presented with suicidal ideation with a plan to walk in front traffic. He has history of bipolar disorder and stimulant use disorder (cocaine). He states that he slept "so-so" last night and that he is very tired this morning. He also endorses that his head and stomach have been bothering him today. He denies any homicidal ideation or audio/visual hallucinations, but does endorse continued SI with plan to walk out in traffic.  Patient receives care from the [facility] and has not been taking any medications. He reports that he is still depressed with low energy.  Encouraged him to attend group.  Minimal information obtained on assessment due to him being drowsy and falling asleep during the assessment.  Well known to this facility for similar [facility]  Historically wants to sleep off his cocaine use and in need of housing.   Principal Problem: Bipolar disorder, current episode depressed, severe, without psychotic features (HCC) Diagnosis: Principal Problem:   Bipolar disorder, current episode depressed, severe, without psychotic features (HCC) Active Problems:   Polysubstance abuse (HCC)   Stimulant-induced mood disorder (HCC)  Total Time spent with patient: 30 minutes  Past Psychiatric History: bipolar d/o, stimulant use d/o (cocaine)  Past Medical History:  Past Medical History:  Diagnosis Date  . Alcohol abuse   . Anxiety   . Arthritis    "all over" (11/25/2015)  . Bipolar 1 disorder (HCC)   . Bipolar affective disorder (HCC)   . Chronic back pain   . Chronic kidney disease   . Cocaine abuse (HCC)    smokes crack-cocaine  . Depression   . GERD (gastroesophageal reflux disease)   . Gout   . Headache    "q couple days; stress, tension, anger" (11/25/2015)  . Hemorrhoids   .  Hepatitis C    "not yet treated" (11/25/2015)  . Hypertension   . Insomnia   . Myocardial infarction [facility]) 11/2015   "supposedly" (11/25/2015)  . OSA on CPAP    "had mask; it was stolen" (11/25/2015)  . PTSD (post-traumatic stress disorder)   . Tobacco abuse   . Vitamin D deficiency     Past Surgical History:  Procedure Laterality Date  . CARDIAC CATHETERIZATION N/A 11/29/2015   Procedure: Left Heart Cath and Coronary Angiography;  Surgeon: Laurey Morale, MD;  Location: [facility] INVASIVE CV LAB;  Service: Cardiovascular;  Laterality: N/A;  . FRACTURE SURGERY    . PATELLA FRACTURE SURGERY Left ~ 1995  . TONSILLECTOMY     Family History:  Family History  Problem Relation Age of Onset  . Hypertension Brother   . Diabetes Brother   . Hypertension Mother   . Diabetes Mother   . Arthritis Mother   . Other Sister        bone disease   Family Psychiatric  History: none Social History:  Social History   Substance and Sexual Activity  Alcohol Use Yes     Social History   Substance and Sexual Activity  Drug Use Yes  . Types: Marijuana, "Crack" cocaine, Cocaine    Social History   Socioeconomic History  . Marital status: Single    Spouse name: Not on file  . Number of children: Not on file  . Years of education: Not on file  .  Highest education level: Not on file  Occupational History  . Not on file  Tobacco Use  . Smoking status: Current Every Day Smoker    Packs/day: 0.25    Years: 46.00    Pack years: 11.50    Types: Cigarettes  . Smokeless tobacco: Never Used  Vaping Use  . Vaping Use: Never used  Substance and Sexual Activity  . Alcohol use: Yes  . Drug use: Yes    Types: Marijuana, "Crack" cocaine, Cocaine  . Sexual activity: Not Currently  Other Topics Concern  . Not on file  Social History Narrative   ** Merged History Encounter **       Lives alone in [place], [place]   Social Determinants of Health   Financial Resource Strain:   . Difficulty of Paying  Living Expenses: Not on file  Food Insecurity:   . Worried About Programme researcher, broadcasting/film/video in the Last Year: Not on file  . Ran Out of Food in the Last Year: Not on file  Transportation Needs:   . Lack of Transportation (Medical): Not on file  . Lack of Transportation (Non-Medical): Not on file  Physical Activity:   . Days of Exercise per Week: Not on file  . Minutes of Exercise per Session: Not on file  Stress:   . Feeling of Stress : Not on file  Social Connections:   . Frequency of Communication with Friends and Family: Not on file  . Frequency of Social Gatherings with Friends and Family: Not on file  . Attends Religious Services: Not on file  . Active Member of Clubs or Organizations: Not on file  . Attends Banker Meetings: Not on file  . Marital Status: Not on file   Additional Social History:      Sleep: Good  Appetite:  Good  Current Medications: Current Facility-Administered Medications  Medication Dose Route Frequency Provider Last Rate Last Admin  . acetaminophen (TYLENOL) tablet 650 mg  650 mg Oral Q6H PRN Antonieta Pert, MD      . alum & mag hydroxide-simeth (MAALOX/MYLANTA) 200-200-20 MG/5ML suspension 30 mL  30 mL Oral Q4H PRN Antonieta Pert, MD      . amLODipine (NORVASC) tablet 10 mg  10 mg Oral Daily Antonieta Pert, MD   10 mg at 02/29/20 3419  . aspirin EC tablet 81 mg  81 mg Oral Daily Antonieta Pert, MD   81 mg at 02/29/20 0920  . cloNIDine (CATAPRES) tablet 0.1 mg  0.1 mg Oral TID PRN Antonieta Pert, MD   0.1 mg at 02/29/20 0919  . clopidogrel (PLAVIX) tablet 75 mg  75 mg Oral Daily Antonieta Pert, MD   75 mg at 02/29/20 3790  . escitalopram (LEXAPRO) tablet 5 mg  5 mg Oral Daily Antonieta Pert, MD   5 mg at 02/29/20 2409  . folic acid (FOLVITE) tablet 1 mg  1 mg Oral Daily Antonieta Pert, MD   1 mg at 02/29/20 0920  . gabapentin (NEURONTIN) capsule 300 mg  300 mg Oral TID Antonieta Pert, MD   300 mg at 02/29/20  0920  . hydrOXYzine (ATARAX/VISTARIL) tablet 25 mg  25 mg Oral TID PRN Antonieta Pert, MD   25 mg at 02/28/20 2137  . lisinopril (ZESTRIL) tablet 40 mg  40 mg Oral Daily Antonieta Pert, MD   40 mg at 02/29/20 0920  . LORazepam (ATIVAN) tablet 1 mg  1 mg Oral  Q6H PRN Antonieta Pert, MD   1 mg at 02/28/20 2137  . magnesium hydroxide (MILK OF MAGNESIA) suspension 30 mL  30 mL Oral Daily PRN Antonieta Pert, MD      . OLANZapine [facility]) tablet 10 mg  10 mg Oral QHS Antonieta Pert, MD   10 mg at 02/28/20 2137  . pantoprazole (PROTONIX) EC tablet 40 mg  40 mg Oral Daily Antonieta Pert, MD   40 mg at 02/29/20 0920  . pravastatin (PRAVACHOL) tablet 20 mg  20 mg Oral Daily Antonieta Pert, MD   20 mg at 02/29/20 0919  . thiamine tablet 100 mg  100 mg Oral Daily Antonieta Pert, MD   100 mg at 02/29/20 4403  . traZODone (DESYREL) tablet 50 mg  50 mg Oral QHS PRN Antonieta Pert, MD   50 mg at 02/28/20 2137    Lab Results: No results found for this or any previous visit (from the past 48 hour(s)).  Blood Alcohol level:  Lab Results  Component Value Date   ETH <10 02/26/2020   ETH <10 02/20/2020    Metabolic Disorder Labs: Lab Results  Component Value Date   HGBA1C 5.8 (H) 11/25/2015   MPG 120 11/25/2015   MPG 137 (H) 03/23/2014   No results found for: PROLACTIN Lab Results  Component Value Date   CHOL 130 11/26/2015   TRIG 84 11/26/2015   HDL 33 (L) 11/26/2015   CHOLHDL 3.9 11/26/2015   VLDL 17 11/26/2015   LDLCALC 80 11/26/2015   LDLCALC 97 03/23/2014    Physical Findings: AIMS: Facial and Oral Movements Muscles of Facial Expression: None, normal Lips and Perioral Area: None, normal Jaw: None, normal Tongue: None, normal,Extremity Movements Upper (arms, wrists, hands, fingers): None, normal Lower (legs, knees, ankles, toes): None, normal, Trunk Movements Neck, shoulders, hips: None, normal, Overall Severity Severity of abnormal movements  (highest score from questions above): None, normal Incapacitation due to abnormal movements: None, normal Patient's awareness of abnormal movements (rate only patient's report): No Awareness, Dental Status Current problems with teeth and/or dentures?: No Does patient usually wear dentures?: No  CIWA:  CIWA-Ar Total: 7 COWS:     Musculoskeletal: Strength & Muscle Tone: within normal limits Gait & Station: normal Patient leans: N/A  Psychiatric Specialty Exam: Physical Exam Vitals and nursing note reviewed.  Constitutional:      Appearance: Normal appearance.  HENT:     Head: Normocephalic.     Nose: Nose normal.  Pulmonary:     Effort: Pulmonary effort is normal.  Musculoskeletal:        General: Normal range of motion.     Cervical back: Normal range of motion.  Neurological:     General: No focal deficit present.     Mental Status: He is alert and oriented to person, place, and time.  Psychiatric:        Attention and Perception: Attention and perception normal.        Mood and Affect: Mood is depressed.        Speech: Speech normal.        Behavior: Behavior is slowed.        Thought Content: Thought content includes suicidal ideation.        Cognition and Memory: Cognition and memory normal.        Judgment: Judgment normal.     Review of Systems  Psychiatric/Behavioral: Positive for dysphoric mood.  All other systems reviewed and are  negative.   Blood pressure (!) 143/112, pulse (!) 123, temperature 97.6 F (36.4 C), temperature source Oral, resp. rate 16, height 5\' 7"  (1.702 m), weight 78 kg, SpO2 100 %.Body mass index is 26.94 kg/m.  General Appearance: Casual  Eye Contact:  Fair  Speech:  Slow  Volume:  Decreased  Mood:  Depressed  Affect:  Congruent  Thought Process:  Coherent and Descriptions of Associations: Intact  Orientation:  Full (Time, Place, and Person)  Thought Content:  WDL and Logical  Suicidal Thoughts:  Yes.  with intent/plan  Homicidal  Thoughts:  No  Memory:  Immediate;   Fair Recent;   Fair Remote;   Fair  Judgement:  Fair  Insight:  Fair  Psychomotor Activity:  Decreased  Concentration:  Concentration: Fair and Attention Span: Fair  Recall:  Good  Fund of Knowledge:  Good  Language:  Good  Akathisia:  No  Handed:  Right  AIMS (if indicated):     Assets:  Leisure Time Physical Health Resilience  ADL's:  Intact  Cognition:  WNL  Sleep:  Number of Hours: 6.5     Treatment Plan Summary: Daily contact with patient to assess and evaluate symptoms and progress in treatment, Medication management and Plan bipolar affective disorder, depressed, severe without psychotic features:  -Continue Zyprexa 10 mg at bedtime -continue Lexapro 5 mg daily  Stimulant use d/o: -Continue gabapentin 300 mg TID -Discontinued Ativan PRN  Anxiety: -Continue hydroxyzine 25 mg TID PRN  Insomnia: -Continue Trazodone 50 mg daily PRN  , NP 02/29/2020, 9:53 AM

## 2020-03-01 DIAGNOSIS — F314 Bipolar disorder, current episode depressed, severe, without psychotic features: Secondary | ICD-10-CM | POA: Diagnosis not present

## 2020-03-01 MED ORDER — ESCITALOPRAM OXALATE 10 MG PO TABS
10.0000 mg | ORAL_TABLET | Freq: Every day | ORAL | Status: DC
  Administered 2020-03-02: 10 mg via ORAL
  Filled 2020-03-01 (×3): qty 1

## 2020-03-01 NOTE — Tx Team (Signed)
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 acetaminophen (TYLENOL) tablet 650 mg  650 mg Oral Q6H PRN Sharma Covert, MD      . alum & mag hydroxide-simeth (MAALOX/MYLANTA) 200-200-20 MG/5ML suspension 30 mL  30 mL Oral Q4H PRN Sharma Covert, MD      . amLODipine (NORVASC) tablet 10 mg  10 mg Oral Daily Sharma Covert, MD   10 mg at 03/01/20 0814  . aspirin EC tablet 81 mg  81 mg Oral Daily Sharma Covert, MD   81 mg at 03/01/20 0813  . cloNIDine (CATAPRES) tablet 0.1 mg  0.1 mg Oral TID PRN Sharma Covert, MD   0.1 mg at 02/29/20 0919  . cloNIDine (CATAPRES) tablet 0.1 mg  0.1 mg Oral BID Sharma Covert, MD   0.1 mg at 03/01/20 0814  . clopidogrel (PLAVIX) tablet 75 mg  75 mg Oral Daily Sharma Covert, MD   75 mg at 03/01/20 0813  . escitalopram (LEXAPRO) tablet 5 mg  5 mg Oral Daily Sharma Covert, MD   5 mg at 03/01/20 0813  . folic acid (FOLVITE) tablet 1 mg  1 mg Oral Daily Sharma Covert, MD   1 mg at 03/01/20 0813  . gabapentin (NEURONTIN) capsule 300 mg  300 mg Oral TID Sharma Covert, MD   300 mg at 03/01/20 0813  . hydrOXYzine (ATARAX/VISTARIL) tablet 25 mg  25 mg Oral TID PRN Sharma Covert, MD   25 mg at 02/29/20 2109  . lisinopril (ZESTRIL) tablet 40 mg  40 mg Oral Daily Sharma Covert, MD   40 mg at 03/01/20 0813  . magnesium hydroxide (MILK OF MAGNESIA) suspension 30 mL  30 mL Oral Daily PRN Sharma Covert, MD      . OLANZapine [facility]) tablet 10 mg  10 mg Oral QHS Sharma Covert, MD   10 mg at 02/29/20 2109  . pantoprazole (PROTONIX) EC tablet 40 mg  40 mg Oral Daily Sharma Covert, MD   40 mg at 03/01/20 0813  . pravastatin (PRAVACHOL) tablet 20 mg  20 mg Oral Daily Sharma Covert, MD   20 mg at 03/01/20 3976  . thiamine tablet 100 mg  100 mg Oral Daily Sharma Covert, MD   100 mg at 03/01/20 7341  . traZODone (DESYREL) tablet 50 mg  50 mg Oral QHS PRN Sharma Covert, MD   50 mg at 02/29/20 2109   PTA Medications: Medications Prior to Admission  Medication Sig Dispense Refill Last Dose  . amLODipine (NORVASC) 10 MG tablet Take 1 tablet (10 mg total) by mouth daily. (Patient not taking: Reported on 02/26/2020) 30 tablet 0   . aspirin EC 81 MG tablet Take 1 tablet (81 mg total) by mouth daily. (Patient not taking: Reported on 10/27/2019) 30 tablet 3   . clopidogrel (PLAVIX) 75 MG tablet Take 1 tablet (75 mg total) by mouth daily. (Patient not taking: Reported on 02/26/2020) 30 tablet 0   . lisinopril (ZESTRIL) 40 MG tablet Take  1 tablet (40 mg total) by mouth daily. (Patient not taking: Reported on 02/26/2020) 30 tablet 0   . OLANZapine (ZYPREXA) 10 MG tablet Take 1 tablet (10 mg total) by mouth 2 (two) times daily. (Patient not taking: Reported on 02/26/2020) 60 tablet 3   . pravastatin (PRAVACHOL) 20 MG tablet Take 1 tablet (20 mg total) by mouth daily. (Patient not taking: Reported on 02/26/2020) 30 tablet 3     Patient Stressors: Financial difficulties Health problems Medication change or noncompliance Substance abuse  Patient Strengths: Ability for insight Curator fund of knowledge  Treatment Modalities: Medication Management, Group therapy, Case management,  1 to 1 session with clinician, Psychoeducation, Recreational therapy.   Physician Treatment Plan for Primary Diagnosis: Bipolar disorder, current episode depressed, severe,  without psychotic features ([place]) Long Term Goal(s): Improvement in symptoms so as ready for discharge Improvement in symptoms so as ready for discharge   Short Term Goals: Ability to identify changes in lifestyle to reduce recurrence of condition will improve Ability to verbalize feelings will improve Ability to disclose and discuss suicidal ideas Ability to demonstrate self-control will improve Ability to identify and develop effective coping behaviors will improve Compliance with prescribed medications will improve Ability to identify triggers associated with substance abuse/mental health issues will improve Ability to identify changes in lifestyle to reduce recurrence of condition will improve Ability to verbalize feelings will improve Ability to disclose and discuss suicidal ideas Ability to demonstrate self-control will improve Ability to identify and develop effective coping behaviors will improve Compliance with prescribed medications will improve Ability to identify triggers associated with substance abuse/mental health issues will improve  Medication Management: Evaluate patient's response, side effects, and tolerance of medication regimen.  Therapeutic Interventions: 1 to 1 sessions, Unit Group sessions and Medication administration.  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[place])  Long Term Goal(s): Improvement in symptoms so as ready for discharge Improvement in symptoms so as ready for discharge   Short Term Goals: Ability to identify changes in lifestyle to reduce recurrence of condition will improve Ability to verbalize feelings will improve Ability to disclose and discuss suicidal ideas Ability to demonstrate self-control will  improve Ability to identify and develop effective coping behaviors will improve Compliance with prescribed medications will improve Ability to identify triggers associated with substance abuse/mental health issues will improve Ability to identify changes in lifestyle to reduce recurrence of condition will improve Ability to verbalize feelings will improve Ability to disclose and discuss suicidal ideas Ability to demonstrate self-control will improve Ability to identify and develop effective coping behaviors will improve Compliance with prescribed medications will improve Ability to identify triggers associated with substance abuse/mental health issues will improve     Medication Management: Evaluate patient's response, side effects, and tolerance of medication regimen.  Therapeutic Interventions: 1 to 1 sessions, Unit Group sessions and Medication administration.  Evaluation of Outcomes: Not Met   RN Treatment Plan for Primary Diagnosis: Bipolar disorder, current episode depressed, severe, without psychotic features ([place]) Long Term Goal(s): Knowledge of disease and therapeutic regimen to maintain health will improve  Short Term Goals: Ability to remain free from injury will improve, Ability to participate in decision making will improve, Ability to verbalize feelings will improve, Ability to disclose and discuss suicidal ideas and Ability to identify and develop  effective coping behaviors will improve  Medication Management: RN will administer medications as ordered by provider, will assess and evaluate patient's response and provide education to patient for prescribed medication. RN will report any adverse and/or side effects to prescribing provider.  Therapeutic Interventions: 1 on 1 counseling sessions, Psychoeducation, Medication administration, Evaluate responses to treatment, Monitor vital signs and CBGs as ordered, Perform/monitor CIWA, COWS, AIMS and Fall Risk screenings as ordered,  Perform wound care treatments as ordered.  Evaluation of Outcomes: Not Met   LCSW Treatment Plan for Primary Diagnosis: Bipolar disorder, current episode depressed, severe, without psychotic features ([place]) Long Term Goal(s): Safe transition to appropriate next level of care at discharge, Engage patient in therapeutic group addressing interpersonal concerns.  Short Term Goals: Engage patient in aftercare planning with referrals and resources, Increase social support, Increase ability to appropriately verbalize feelings, Facilitate acceptance of mental health diagnosis and concerns, Facilitate patient progression through stages of change regarding substance use diagnoses and concerns and Identify triggers associated with mental health/substance abuse issues  Therapeutic Interventions: Assess for all discharge needs, 1 to 1 time with Social worker, Explore available resources and support systems, Assess for adequacy in community support network, Educate family and significant other(s) on suicide prevention, Complete Psychosocial Assessment, Interpersonal group therapy.  Evaluation of Outcomes: Not Met   Progress in Treatment: Attending groups: No. Participating in groups: No. Taking medication as prescribed: Yes. Toleration medication: Yes. Family/Significant other contact made: No, will contact:  Consents not provided Patient understands diagnosis: Yes. Discussing patient identified problems/goals with staff: Yes. Medical problems stabilized or resolved: Yes. Denies suicidal/homicidal ideation: Yes. Issues/concerns per patient self-inventory: No.   New problem(s) identified: No, Describe:  None  New Short Term/Long Term Goal(s): medication stabilization, elimination of SI thoughts, development of comprehensive mental wellness plan.   Patient Goals: Did not attend   Discharge Plan or Barriers: Patient recently admitted. CSW will continue to follow and assess for appropriate referrals  and possible discharge planning.   Reason for Continuation of Hospitalization: Depression Medication stabilization Suicidal ideation  Estimated Length of Stay: 3 to 5 days  Attendees: Patient: Did not attend 03/01/2020   Physician: Lala Lund, MD 03/01/2020   Nursing:  03/01/2020   RN Care Manager: 03/01/2020   Social Worker: Verdis Frederickson, [place] 03/01/2020   Recreational Therapist:  03/01/2020   Other:  03/01/2020   Other:  03/01/2020   Other: 03/01/2020     Scribe for Treatment Team: Darleen Crocker, [place] 03/01/2020 10:54 AM

## 2020-03-01 NOTE — Progress Notes (Signed)
   03/01/20 1500  Psych Admission Type (Psych Patients Only)  Admission Status Voluntary  Psychosocial Assessment  Patient Complaints Depression  Eye Contact Brief  Facial Expression Anxious  Affect Appropriate to circumstance  Speech Logical/coherent;Soft  Interaction Guarded;Minimal  Motor Activity Slow  Appearance/Hygiene Disheveled  Behavior Characteristics Cooperative  Mood Depressed  Thought Process  Coherency WDL  Content WDL  Delusions None reported or observed  Perception WDL  Hallucination None reported or observed  Judgment Impaired  Confusion None  Danger to Self  Current suicidal ideation? Passive  Self-Injurious Behavior No self-injurious ideation or behavior indicators observed or expressed   Agreement Not to Harm Self Yes  Description of Agreement verbally  Danger to Others  Danger to Others None reported or observed  Dar Note: Patient presents with a flat affect and depressed mood.  Report passive SI but verbally contracts for safety.  Medication given as prescribed.  Routine safety checks maintained for safety.  Minimal interaction with staff and peers.  Patient is safe on and off the unit.

## 2020-03-01 NOTE — Progress Notes (Signed)
Pt stated he felt the same as when he came in. Pt visible in the dayroom not interacting with peers    03/01/20 2000  Psych Admission Type (Psych Patients Only)  Admission Status Voluntary  Psychosocial Assessment  Patient Complaints Depression  Eye Contact Brief  Facial Expression Anxious  Affect Appropriate to circumstance  Speech Logical/coherent;Soft  Interaction Guarded;Minimal  Motor Activity Slow  Appearance/Hygiene Disheveled  Behavior Characteristics Cooperative  Mood Depressed  Thought Process  Coherency WDL  Content WDL  Delusions None reported or observed  Perception WDL  Hallucination None reported or observed  Judgment Impaired  Confusion None  Danger to Self  Current suicidal ideation? Passive  Self-Injurious Behavior No self-injurious ideation or behavior indicators observed or expressed   Agreement Not to Harm Self Yes  Description of Agreement verbally  Danger to Others  Danger to Others None reported or observed

## 2020-03-01 NOTE — Progress Notes (Signed)
Recreation Therapy Notes  Date:  1.22.20 Time: 0930 Location: [address] Dayroom  Group Topic: Stress Management  Goal Area(s) Addresses:  Patient will identify positive stress management techniques. Patient will identify benefits of using stress management post d/c.  Intervention: Stress Management  Activity :  Meditation.  LRT played a meditation that guided patients through a body scan to take inventory of any sensations they may have been feeling at the time.  Patients were to listen and follow along as meditation played to fully engage.  Education:  Stress Management, Discharge Planning.   Education Outcome: Acknowledges Education  Clinical Observations/Feedback: Pt did not attend group session.      Caroll Rancher, LRT/CTRS         Caroll Rancher A 03/01/2020 12:33 PM

## 2020-03-01 NOTE — [facility] Group Notes (Signed)
Occupational Therapy Group Note Date: 03/01/2020 Group Topic/Focus: Health and Wellness  Group Description: Group encouraged increased engagement and participation through discussion focused on Gratitude. Patients explored a variety of strategies to "build happiness" and focus on gratitude, through use of gratitude journal, giving thanks, mindfulness walk, gratitude letter, taking a break, and identifying daily gratitude. Discussion focused on patients sharing some of their daily gratitudes for the day.   Participation Level: Patient did not attend OT group session despite personal invitation.    Plan: Continue to engage patient in OT groups 2 - 3x/week.   03/01/2020  Donne Hazel, MOT, OTR/L

## 2020-03-01 NOTE — Progress Notes (Signed)
DID NOT ATTEND    Spiritual care group on grief and loss facilitated by chaplain Burnis Kingfisher  Group Goal:  Support / Education around grief and loss  Members engage in facilitated group support and psycho-social education.  Group Description:  Following introductions and group rules, group members engaged in facilitated group dialog and support around topic of loss, with particular support around experiences of loss in their lives. Group Identified types of loss (relationships / self / things) and identified patterns, circumstances, and changes that precipitate losses. Reflected on thoughts / feelings around loss, normalized grief responses, and recognized variety in grief experience.  Patient Progress: [name] did not attend group.

## 2020-03-01 NOTE — [facility] Group Notes (Signed)
[facility] LCSW Group Therapy  03/01/2020 2:20 PM  Type of Therapy:  Group Therapy  Participation Level:  Did Not Attend  Summary of Progress/Problems: Group included watching a Ted Talk titled "Why We Should All Try Therapy" and a worksheet titled Mental Health Maintenance Plan that discusses triggers, warning signs, self-care, coping strategies, and returning to therapy. The worksheet allows the pt to explore and plan about how to navigate life post-discharge.Pt did not attend.   Felizardo Hoffmann 03/01/2020, 2:20 PM

## 2020-03-01 NOTE — Progress Notes (Signed)
Patient ID: [name], male   DOB: 1960/07/22, 59 y.o.   MRN: [id number]   [facility] MD Progress Note  03/01/2020 5:36 PM LB MAUGHAN  MRN:  [id number]   Daily Note:   Patient was seen sleeping in bed, where he continues to endorse depressed mood and SI.  He was encouraged to get out of bed and [facility] in [facility] to potentially obtain more benefits.  Patient unable to do this at this time.  He is agreeable to continue  in order to address his mood.  No acute issues overnight, patient sleeping fine and eating without issue.  We will continue to monitor and  observe.  Principal Problem: Bipolar disorder, current episode depressed, severe, without psychotic features (HCC) Diagnosis: Principal Problem:   Bipolar disorder, current episode depressed, severe, without psychotic features (HCC) Active Problems:   Polysubstance abuse (HCC)   Stimulant-induced mood disorder (HCC)  Total Time spent with patient: 30 minutes  Past Psychiatric History: bipolar d/o, stimulant use d/o (cocaine)  Past Medical History:  Past Medical History:  Diagnosis Date  . Alcohol abuse   . Anxiety   . Arthritis    "all over" (11/25/2015)  . Bipolar 1 disorder (HCC)   . Bipolar affective disorder (HCC)   . Chronic back pain   . Chronic kidney disease   . Cocaine abuse (HCC)    smokes crack-cocaine  . Depression   . GERD (gastroesophageal reflux disease)   . Gout   . Headache    "q couple days; stress, tension, anger" (11/25/2015)  . Hemorrhoids   . Hepatitis C    "not yet treated" (11/25/2015)  . Hypertension   . Insomnia   . Myocardial infarction [facility]) 11/2015   "supposedly" (11/25/2015)  . OSA on CPAP    "had mask; it was stolen" (11/25/2015)  . PTSD (post-traumatic stress disorder)   . Tobacco abuse   . Vitamin D deficiency     Past Surgical History:  Procedure Laterality Date  . CARDIAC CATHETERIZATION N/A 11/29/2015   Procedure: Left Heart Cath and Coronary Angiography;  Surgeon: Laurey Morale, MD;  Location: [facility] INVASIVE CV LAB;  Service: Cardiovascular;  Laterality: N/A;  . FRACTURE SURGERY    . PATELLA FRACTURE SURGERY Left ~ 1995  . TONSILLECTOMY     Family History:  Family History  Problem Relation Age of Onset  . Hypertension Brother   . Diabetes Brother   . Hypertension Mother   . Diabetes Mother   . Arthritis Mother   . Other Sister        bone disease   Family Psychiatric  History: none Social History:  Social History   Substance and Sexual Activity  Alcohol Use Yes     Social History   Substance and Sexual Activity  Drug Use Yes  . Types: Marijuana, "Crack" cocaine, Cocaine    Social History   Socioeconomic History  . Marital status: Single    Spouse name: Not on file  . Number of children: Not on file  . Years of education: Not on file  . Highest education level: Not on file  Occupational History  . Not on file  Tobacco Use  . Smoking status: Current Every Day Smoker    Packs/day: 0.25    Years: 46.00    Pack years: 11.50    Types: Cigarettes  . Smokeless tobacco: Never Used  Vaping Use  . Vaping Use: Never used  Substance and Sexual Activity  .  Alcohol use: Yes  . Drug use: Yes    Types: Marijuana, "Crack" cocaine, Cocaine  . Sexual activity: Not Currently  Other Topics Concern  . Not on file  Social History Narrative   ** Merged History Encounter **       Lives alone in [place], [place]   Social Determinants of Health   Financial Resource Strain:   . Difficulty of Paying Living Expenses: Not on file  Food Insecurity:   . Worried About Programme researcher, broadcasting/film/video in the Last Year: Not on file  . Ran Out of Food in the Last Year: Not on file  Transportation Needs:   . Lack of Transportation (Medical): Not on file  . Lack of Transportation (Non-Medical): Not on file  Physical Activity:   . Days of Exercise per Week: Not on file  . Minutes of Exercise per Session: Not on file  Stress:   . Feeling of Stress : Not on file   Social Connections:   . Frequency of Communication with Friends and Family: Not on file  . Frequency of Social Gatherings with Friends and Family: Not on file  . Attends Religious Services: Not on file  . Active Member of Clubs or Organizations: Not on file  . Attends Banker Meetings: Not on file  . Marital Status: Not on file   Additional Social History:      Sleep: Good  Appetite:  Good  Current Medications: Current Facility-Administered Medications  Medication Dose Route Frequency Provider Last Rate Last Admin  . acetaminophen (TYLENOL) tablet 650 mg  650 mg Oral Q6H PRN Antonieta Pert, MD      . alum & mag hydroxide-simeth (MAALOX/MYLANTA) 200-200-20 MG/5ML suspension 30 mL  30 mL Oral Q4H PRN Antonieta Pert, MD      . amLODipine (NORVASC) tablet 10 mg  10 mg Oral Daily Antonieta Pert, MD   10 mg at 03/01/20 0814  . aspirin EC tablet 81 mg  81 mg Oral Daily Antonieta Pert, MD   81 mg at 03/01/20 0813  . cloNIDine (CATAPRES) tablet 0.1 mg  0.1 mg Oral TID PRN Antonieta Pert, MD   0.1 mg at 02/29/20 0919  . cloNIDine (CATAPRES) tablet 0.1 mg  0.1 mg Oral BID Antonieta Pert, MD   0.1 mg at 03/01/20 0814  . clopidogrel (PLAVIX) tablet 75 mg  75 mg Oral Daily Antonieta Pert, MD   75 mg at 03/01/20 0813  . [START ON 03/02/2020] escitalopram (LEXAPRO) tablet 10 mg  10 mg Oral Daily Hiroko Tregre A, MD      . folic acid (FOLVITE) tablet 1 mg  1 mg Oral Daily Antonieta Pert, MD   1 mg at 03/01/20 0813  . gabapentin (NEURONTIN) capsule 300 mg  300 mg Oral TID Antonieta Pert, MD   300 mg at 03/01/20 1236  . hydrOXYzine (ATARAX/VISTARIL) tablet 25 mg  25 mg Oral TID PRN Antonieta Pert, MD   25 mg at 02/29/20 2109  . lisinopril (ZESTRIL) tablet 40 mg  40 mg Oral Daily Antonieta Pert, MD   40 mg at 03/01/20 0813  . magnesium hydroxide (MILK OF MAGNESIA) suspension 30 mL  30 mL Oral Daily PRN Antonieta Pert, MD      . OLANZapine  [facility]) tablet 10 mg  10 mg Oral QHS Antonieta Pert, MD   10 mg at 02/29/20 2109  . pantoprazole (PROTONIX) EC tablet 40 mg  40 mg Oral Daily Antonieta Pert, MD   40 mg at 03/01/20 0813  . pravastatin (PRAVACHOL) tablet 20 mg  20 mg Oral Daily Antonieta Pert, MD   20 mg at 03/01/20 1610  . thiamine tablet 100 mg  100 mg Oral Daily Antonieta Pert, MD   100 mg at 03/01/20 9604  . traZODone (DESYREL) tablet 50 mg  50 mg Oral QHS PRN Antonieta Pert, MD   50 mg at 02/29/20 2109    Lab Results:  Results for orders placed or performed during the hospital encounter of 02/27/20 (from the past 48 hour(s))  Hemoglobin A1c     Status: Abnormal   Collection Time: 02/29/20  5:45 PM  Result Value Ref Range   Hgb A1c MFr Bld 5.8 (H) 4.8 - 5.6 %    Comment: (NOTE) Pre diabetes:          5.7%-6.4%  Diabetes:              >6.4%  Glycemic control for   <7.0% adults with diabetes    Mean Plasma Glucose 119.76 mg/dL    Comment: Performed at [facility], [address] [place], [place] [address]  Lipid panel     Status: None   Collection Time: 02/29/20  5:45 PM  Result Value Ref Range   Cholesterol 154 0 - 200 mg/dL   Triglycerides 119 <147 mg/dL   HDL 50 >82 mg/dL   Total CHOL/HDL Ratio 3.1 RATIO   VLDL 24 0 - 40 mg/dL   LDL Cholesterol 80 0 - 99 mg/dL    Comment:        Total Cholesterol/HDL:CHD Risk Coronary Heart Disease Risk Table                     Men   Women  1/2 Average Risk   3.4   3.3  Average Risk       5.0   4.4  2 X Average Risk   9.6   7.1  3 X Average Risk  23.4   11.0        Use the calculated Patient Ratio above and the CHD Risk Table to determine the patient's CHD Risk.        ATP III CLASSIFICATION (LDL):  <100     mg/dL   Optimal  956-213  mg/dL   Near or Above                    Optimal  130-159  mg/dL   Borderline  086-578  mg/dL   High  >469     mg/dL   Very High Performed at [facility], [address]  [place], [place] [address]   TSH     Status: None   Collection Time: 02/29/20  5:45 PM  Result Value Ref Range   TSH 1.209 0.350 - 4.500 uIU/mL    Comment: Performed by a 3rd Generation assay with a functional sensitivity of <=0.01 uIU/mL. Performed at [facility], [address] [place], [place] [address]   Comprehensive metabolic panel     Status: None   Collection Time: 02/29/20  5:45 PM  Result Value Ref Range   Sodium 141 135 - 145 mmol/L   Potassium 4.0 3.5 - 5.1 mmol/L   Chloride 106 98 - 111 mmol/L   CO2 25 22 - 32 mmol/L   Glucose, Bld 85 70 - 99 mg/dL  Comment: Glucose reference range applies only to samples taken after fasting for at least 8 hours.   BUN 12 6 - 20 mg/dL   Creatinine, Ser 1.61 0.61 - 1.24 mg/dL   Calcium 8.9 8.9 - 09.6 mg/dL   Total Protein 7.2 6.5 - 8.1 g/dL   Albumin 4.1 3.5 - 5.0 g/dL   AST 20 15 - 41 U/L   ALT 25 0 - 44 U/L   Alkaline Phosphatase 54 38 - 126 U/L   Total Bilirubin 0.5 0.3 - 1.2 mg/dL   GFR, Estimated >04 >54 mL/min    Comment: (NOTE) Calculated using the CKD-EPI Creatinine Equation (2021)    Anion gap 10 5 - 15    Comment: Performed at [facility], [address] [place], [place] [address]    Blood Alcohol level:  Lab Results  Component Value Date   ETH <10 02/26/2020   ETH <10 02/20/2020    Metabolic Disorder Labs: Lab Results  Component Value Date   HGBA1C 5.8 (H) 02/29/2020   MPG 119.76 02/29/2020   MPG 120 11/25/2015   No results found for: PROLACTIN Lab Results  Component Value Date   CHOL 154 02/29/2020   TRIG 121 02/29/2020   HDL 50 02/29/2020   CHOLHDL 3.1 02/29/2020   VLDL 24 02/29/2020   LDLCALC 80 02/29/2020   LDLCALC 80 11/26/2015    Physical Findings: AIMS: Facial and Oral Movements Muscles of Facial Expression: None, normal Lips and Perioral Area: None, normal Jaw: None, normal Tongue: None, normal,Extremity Movements Upper (arms, wrists, hands,  fingers): None, normal Lower (legs, knees, ankles, toes): None, normal, Trunk Movements Neck, shoulders, hips: None, normal, Overall Severity Severity of abnormal movements (highest score from questions above): None, normal Incapacitation due to abnormal movements: None, normal Patient's awareness of abnormal movements (rate only patient's report): No Awareness, Dental Status Current problems with teeth and/or dentures?: No Does patient usually wear dentures?: No  CIWA:  CIWA-Ar Total: 2 COWS:     Musculoskeletal: Strength & Muscle Tone: within normal limits Gait & Station: normal Patient leans: N/A  Psychiatric Specialty Exam: Physical Exam Vitals and nursing note reviewed.  Constitutional:      Appearance: Normal appearance.  HENT:     Head: Normocephalic.     Nose: Nose normal.  Pulmonary:     Effort: Pulmonary effort is normal.  Musculoskeletal:        General: Normal range of motion.     Cervical back: Normal range of motion.  Neurological:     General: No focal deficit present.     Mental Status: He is alert and oriented to person, place, and time.  Psychiatric:        Attention and Perception: Attention and perception normal.        Mood and Affect: Mood is depressed.        Speech: Speech normal.        Behavior: Behavior is slowed.        Thought Content: Thought content includes suicidal ideation.        Cognition and Memory: Cognition and memory normal.        Judgment: Judgment normal.     Review of Systems  Psychiatric/Behavioral: Positive for dysphoric mood.  All other systems reviewed and are negative.   Blood pressure 114/87, pulse (!) 122, temperature 98.1 F (36.7 C), temperature source Oral, resp. rate 16, height  (1.702 m), weight 78 kg, SpO2 100 %.Body mass index is 26.94  kg/m.  General Appearance: Casual  Eye Contact:  Fair  Speech:  Slow  Volume:  Decreased  Mood:  Depressed  Affect:  Congruent  Thought Process:  Coherent and  Descriptions of Associations: Intact  Orientation:  Full (Time, Place, and Person)  Thought Content:  WDL and Logical  Suicidal Thoughts:  Yes.  with intent/plan  Homicidal Thoughts:  No  Memory:  Immediate;   Fair Recent;   Fair Remote;   Fair  Judgement:  Fair  Insight:  Fair  Psychomotor Activity:  Decreased  Concentration:  Concentration: Fair and Attention Span: Fair  Recall:  Good  Fund of Knowledge:  Good  Language:  Good  Akathisia:  No  Handed:  Right  AIMS (if indicated):     Assets:  Leisure Time Physical Health Resilience  ADL's:  Intact  Cognition:  WNL  Sleep:  Number of Hours: 5.75     Treatment Plan Summary: Daily contact with patient to assess and evaluate symptoms and progress in treatment, Medication management and Plan bipolar affective disorder, depressed, severe without psychotic features:  -Continue Zyprexa 10 mg at bedtime -Increase Lexapro to 10 mg daily  Stimulant use d/o: -Continue gabapentin 300 mg TID -Discontinued Ativan PRN  Anxiety: -Continue hydroxyzine 25 mg TID PRN  Insomnia: -Continue Trazodone 50 mg daily PRN  Clement Sayres, MD 03/01/2020, 5:36 PM

## 2020-03-02 DIAGNOSIS — F314 Bipolar disorder, current episode depressed, severe, without psychotic features: Secondary | ICD-10-CM | POA: Diagnosis not present

## 2020-03-02 MED ORDER — ESCITALOPRAM OXALATE 20 MG PO TABS
20.0000 mg | ORAL_TABLET | Freq: Every day | ORAL | Status: DC
  Administered 2020-03-03 – 2020-03-09 (×7): 20 mg via ORAL
  Filled 2020-03-02 (×9): qty 1

## 2020-03-02 MED ORDER — OLANZAPINE 7.5 MG PO TABS
7.5000 mg | ORAL_TABLET | Freq: Every day | ORAL | Status: DC
  Administered 2020-03-03 – 2020-03-08 (×6): 7.5 mg via ORAL
  Filled 2020-03-02 (×9): qty 1

## 2020-03-02 NOTE — Progress Notes (Signed)
Patient rates his day as a 5 out of 10. He shared that he is trying to enjoy his stay in the hospital but offered no additional details. His goal for tomorrow is to make it better than today.

## 2020-03-02 NOTE — Progress Notes (Signed)
Patient ID: [name], male   DOB: 08-Jan-1961, 59 y.o.   MRN: [id number]   [facility] MD Progress Note  03/02/2020 2:26 PM [name]  MRN:  [id number]   Daily Note:  Patient seen, chart reviewed, case discussed with treatment team. Patient was seen sleeping in bed again today.he still reports poor mood, although states he feels as if it is starting to improve.  He was encouraged to get out of bed and call the [name] in order to potentially obtain more benefits once again.  Marland Kitchen  He is agreeable to continue medication changes in order to address his mood.  No acute issues overnight, patient sleeping fine and eating without issue.  We will continue to monitor and  observe.  Principal Problem: Bipolar disorder, current episode depressed, severe, without psychotic features (HCC) Diagnosis: Principal Problem:   Bipolar disorder, current episode depressed, severe, without psychotic features (HCC) Active Problems:   Polysubstance abuse (HCC)   Stimulant-induced mood disorder (HCC)  Total Time spent with patient: 30 minutes  Past Psychiatric History: bipolar d/o, stimulant use d/o (cocaine)  Past Medical History:  Past Medical History:  Diagnosis Date  . Alcohol abuse   . Anxiety   . Arthritis    "all over" (11/25/2015)  . Bipolar 1 disorder (HCC)   . Bipolar affective disorder (HCC)   . Chronic back pain   . Chronic kidney disease   . Cocaine abuse (HCC)    smokes crack-cocaine  . Depression   . GERD (gastroesophageal reflux disease)   . Gout   . Headache    "q couple days; stress, tension, anger" (11/25/2015)  . Hemorrhoids   . Hepatitis C    "not yet treated" (11/25/2015)  . Hypertension   . Insomnia   . Myocardial infarction [facility]) 11/2015   "supposedly" (11/25/2015)  . OSA on CPAP    "had mask; it was stolen" (11/25/2015)  . PTSD (post-traumatic stress disorder)   . Tobacco abuse   . Vitamin D deficiency     Past Surgical History:  Procedure Laterality Date  . CARDIAC  CATHETERIZATION N/A 11/29/2015   Procedure: Left Heart Cath and Coronary Angiography;  Surgeon: Laurey Morale, MD;  Location: [facility] INVASIVE CV LAB;  Service: Cardiovascular;  Laterality: N/A;  . FRACTURE SURGERY    . PATELLA FRACTURE SURGERY Left ~ 1995  . TONSILLECTOMY     Family History:  Family History  Problem Relation Age of Onset  . Hypertension Brother   . Diabetes Brother   . Hypertension Mother   . Diabetes Mother   . Arthritis Mother   . Other Sister        bone disease   Family Psychiatric  History: none Social History:  Social History   Substance and Sexual Activity  Alcohol Use Yes     Social History   Substance and Sexual Activity  Drug Use Yes  . Types: Marijuana, "Crack" cocaine, Cocaine    Social History   Socioeconomic History  . Marital status: Single    Spouse name: Not on file  . Number of children: Not on file  . Years of education: Not on file  . Highest education level: Not on file  Occupational History  . Not on file  Tobacco Use  . Smoking status: Current Every Day Smoker    Packs/day: 0.25    Years: 46.00    Pack years: 11.50    Types: Cigarettes  . Smokeless tobacco: Never Used  Vaping Use  .  Vaping Use: Never used  Substance and Sexual Activity  . Alcohol use: Yes  . Drug use: Yes    Types: Marijuana, "Crack" cocaine, Cocaine  . Sexual activity: Not Currently  Other Topics Concern  . Not on file  Social History Narrative   ** Merged History Encounter **       Lives alone in [place], [place]   Social Determinants of Health   Financial Resource Strain:   . Difficulty of Paying Living Expenses: Not on file  Food Insecurity:   . Worried About Programme researcher, broadcasting/film/video in the Last Year: Not on file  . Ran Out of Food in the Last Year: Not on file  Transportation Needs:   . Lack of Transportation (Medical): Not on file  . Lack of Transportation (Non-Medical): Not on file  Physical Activity:   . Days of Exercise per Week: Not on  file  . Minutes of Exercise per Session: Not on file  Stress:   . Feeling of Stress : Not on file  Social Connections:   . Frequency of Communication with Friends and Family: Not on file  . Frequency of Social Gatherings with Friends and Family: Not on file  . Attends Religious Services: Not on file  . Active Member of Clubs or Organizations: Not on file  . Attends Banker Meetings: Not on file  . Marital Status: Not on file   Additional Social History:      Sleep: Good  Appetite:  Good  Current Medications: Current Facility-Administered Medications  Medication Dose Route Frequency Provider Last Rate Last Admin  . acetaminophen (TYLENOL) tablet 650 mg  650 mg Oral Q6H PRN Antonieta Pert, MD      . alum & mag hydroxide-simeth (MAALOX/MYLANTA) 200-200-20 MG/5ML suspension 30 mL  30 mL Oral Q4H PRN Antonieta Pert, MD      . amLODipine (NORVASC) tablet 10 mg  10 mg Oral Daily Antonieta Pert, MD   10 mg at 03/02/20 0824  . aspirin EC tablet 81 mg  81 mg Oral Daily Antonieta Pert, MD   81 mg at 03/02/20 2336  . cloNIDine (CATAPRES) tablet 0.1 mg  0.1 mg Oral TID PRN Antonieta Pert, MD   0.1 mg at 02/29/20 0919  . cloNIDine (CATAPRES) tablet 0.1 mg  0.1 mg Oral BID Antonieta Pert, MD   0.1 mg at 03/02/20 0824  . clopidogrel (PLAVIX) tablet 75 mg  75 mg Oral Daily Antonieta Pert, MD   75 mg at 03/02/20 0824  . escitalopram (LEXAPRO) tablet 10 mg  10 mg Oral Daily Keundra Petrucelli, Worthy Rancher, MD   10 mg at 03/02/20 0824  . folic acid (FOLVITE) tablet 1 mg  1 mg Oral Daily Antonieta Pert, MD   1 mg at 03/02/20 0825  . gabapentin (NEURONTIN) capsule 300 mg  300 mg Oral TID Antonieta Pert, MD   300 mg at 03/02/20 1253  . hydrOXYzine (ATARAX/VISTARIL) tablet 25 mg  25 mg Oral TID PRN Antonieta Pert, MD   25 mg at 02/29/20 2109  . lisinopril (ZESTRIL) tablet 40 mg  40 mg Oral Daily Antonieta Pert, MD   40 mg at 03/02/20 0825  . magnesium hydroxide  (MILK OF MAGNESIA) suspension 30 mL  30 mL Oral Daily PRN Antonieta Pert, MD      . OLANZapine [facility]) tablet 10 mg  10 mg Oral QHS Antonieta Pert, MD   10 mg at  03/01/20 2118  . pantoprazole (PROTONIX) EC tablet 40 mg  40 mg Oral Daily Antonieta Pert, MD   40 mg at 03/02/20 0825  . pravastatin (PRAVACHOL) tablet 20 mg  20 mg Oral Daily Antonieta Pert, MD   20 mg at 03/02/20 0825  . thiamine tablet 100 mg  100 mg Oral Daily Antonieta Pert, MD   100 mg at 03/02/20 2330  . traZODone (DESYREL) tablet 50 mg  50 mg Oral QHS PRN Antonieta Pert, MD   50 mg at 03/01/20 2118    Lab Results:  Results for orders placed or performed during the hospital encounter of 02/27/20 (from the past 48 hour(s))  Hemoglobin A1c     Status: Abnormal   Collection Time: 02/29/20  5:45 PM  Result Value Ref Range   Hgb A1c MFr Bld 5.8 (H) 4.8 - 5.6 %    Comment: (NOTE) Pre diabetes:          5.7%-6.4%  Diabetes:              >6.4%  Glycemic control for   <7.0% adults with diabetes    Mean Plasma Glucose 119.76 mg/dL    Comment: Performed at [facility], [address] [place], [place] [address]  Lipid panel     Status: None   Collection Time: 02/29/20  5:45 PM  Result Value Ref Range   Cholesterol 154 0 - 200 mg/dL   Triglycerides 633 <354 mg/dL   HDL 50 >56 mg/dL   Total CHOL/HDL Ratio 3.1 RATIO   VLDL 24 0 - 40 mg/dL   LDL Cholesterol 80 0 - 99 mg/dL    Comment:        Total Cholesterol/HDL:CHD Risk Coronary Heart Disease Risk Table                     Men   Women  1/2 Average Risk   3.4   3.3  Average Risk       5.0   4.4  2 X Average Risk   9.6   7.1  3 X Average Risk  23.4   11.0        Use the calculated Patient Ratio above and the CHD Risk Table to determine the patient's CHD Risk.        ATP III CLASSIFICATION (LDL):  <100     mg/dL   Optimal  256-389  mg/dL   Near or Above                    Optimal  130-159  mg/dL   Borderline  373-428  mg/dL   High   >768     mg/dL   Very High Performed at [facility], [address] [place], [place] [address]   TSH     Status: None   Collection Time: 02/29/20  5:45 PM  Result Value Ref Range   TSH 1.209 0.350 - 4.500 uIU/mL    Comment: Performed by a 3rd Generation assay with a functional sensitivity of <=0.01 uIU/mL. Performed at [facility], [address], [place], [place] [address]   Comprehensive metabolic panel     Status: None   Collection Time: 02/29/20  5:45 PM  Result Value Ref Range   Sodium 141 135 - 145 mmol/L   Potassium 4.0 3.5 - 5.1 mmol/L   Chloride 106 98 - 111 mmol/L   CO2 25 22 -  32 mmol/L   Glucose, Bld 85 70 - 99 mg/dL    Comment: Glucose reference range applies only to samples taken after fasting for at least 8 hours.   BUN 12 6 - 20 mg/dL   Creatinine, Ser 0.26 0.61 - 1.24 mg/dL   Calcium 8.9 8.9 - 37.8 mg/dL   Total Protein 7.2 6.5 - 8.1 g/dL   Albumin 4.1 3.5 - 5.0 g/dL   AST 20 15 - 41 U/L   ALT 25 0 - 44 U/L   Alkaline Phosphatase 54 38 - 126 U/L   Total Bilirubin 0.5 0.3 - 1.2 mg/dL   GFR, Estimated >58 >85 mL/min    Comment: (NOTE) Calculated using the CKD-EPI Creatinine Equation (2021)    Anion gap 10 5 - 15    Comment: Performed at [facility], [address] [place], [place] [address]    Blood Alcohol level:  Lab Results  Component Value Date   ETH <10 02/26/2020   ETH <10 02/20/2020    Metabolic Disorder Labs: Lab Results  Component Value Date   HGBA1C 5.8 (H) 02/29/2020   MPG 119.76 02/29/2020   MPG 120 11/25/2015   No results found for: PROLACTIN Lab Results  Component Value Date   CHOL 154 02/29/2020   TRIG 121 02/29/2020   HDL 50 02/29/2020   CHOLHDL 3.1 02/29/2020   VLDL 24 02/29/2020   LDLCALC 80 02/29/2020   LDLCALC 80 11/26/2015    Physical Findings: AIMS: Facial and Oral Movements Muscles of Facial Expression: None, normal Lips and Perioral Area: None,  normal Jaw: None, normal Tongue: None, normal,Extremity Movements Upper (arms, wrists, hands, fingers): None, normal Lower (legs, knees, ankles, toes): None, normal, Trunk Movements Neck, shoulders, hips: None, normal, Overall Severity Severity of abnormal movements (highest score from questions above): None, normal Incapacitation due to abnormal movements: None, normal Patient's awareness of abnormal movements (rate only patient's report): No Awareness, Dental Status Current problems with teeth and/or dentures?: No Does patient usually wear dentures?: No  CIWA:  CIWA-Ar Total: 2 COWS:     Musculoskeletal: Strength & Muscle Tone: within normal limits Gait & Station: normal Patient leans: N/A  Psychiatric Specialty Exam: Physical Exam Vitals and nursing note reviewed.  Constitutional:      Appearance: Normal appearance.  HENT:     Head: Normocephalic.     Nose: Nose normal.  Pulmonary:     Effort: Pulmonary effort is normal.  Musculoskeletal:        General: Normal range of motion.     Cervical back: Normal range of motion.  Neurological:     General: No focal deficit present.     Mental Status: He is alert and oriented to person, place, and time.  Psychiatric:        Attention and Perception: Attention and perception normal.        Mood and Affect: Mood is depressed.        Speech: Speech normal.        Behavior: Behavior is slowed.        Thought Content: Thought content includes suicidal ideation.        Cognition and Memory: Cognition and memory normal.        Judgment: Judgment normal.     Review of Systems  Psychiatric/Behavioral: Positive for dysphoric mood.  All other systems reviewed and are negative.   Blood pressure (!) 114/91, pulse (!) 116, temperature 98.1 F (36.7 C), temperature source Oral, resp. rate Marland Kitchen)  24, height 5\' 7"  (1.702 m), weight 78 kg, SpO2 100 %.Body mass index is 26.94 kg/m.  General Appearance: Casual  Eye Contact:  Fair  Speech:   Slow  Volume:  Decreased  Mood:  Depressed  Affect:  Congruent  Thought Process:  Coherent and Descriptions of Associations: Intact  Orientation:  Full (Time, Place, and Person)  Thought Content:  WDL and Logical  Suicidal Thoughts:  Yes.  with intent/plan  Homicidal Thoughts:  No  Memory:  Immediate;   Fair Recent;   Fair Remote;   Fair  Judgement:  Fair  Insight:  Fair  Psychomotor Activity:  Decreased  Concentration:  Concentration: Fair and Attention Span: Fair  Recall:  Good  Fund of Knowledge:  Good  Language:  Good  Akathisia:  No  Handed:  Right  AIMS (if indicated):     Assets:  Leisure Time Physical Health Resilience  ADL's:  Intact  Cognition:  WNL  Sleep:  Number of Hours: 6.75     Treatment Plan Summary: Daily contact with patient to assess and evaluate symptoms and progress in treatment, Medication management and Plan bipolar affective disorder, depressed, severe without psychotic features:  -Decrease Zyprexa  To 7.5 mg at bedtime -Increase Lexapro to 20 mg daily -clonidine 0.1mg  BID -Plavix 75mg  daily  Stimulant use d/o: -Continue gabapentin 300 mg TID  Anxiety: -Continue hydroxyzine 25 mg TID PRN  Insomnia: -Continue Trazodone 50 mg daily PRN  , MD 03/02/2020, 2:26 PM  Patient ID: [name], male   DOB: [birth year], 59 y.o.   MRN: [id number]

## 2020-03-02 NOTE — Progress Notes (Signed)
PT is alert and oriented to person, place, time and situation. Pt is calm, cooperative, denies suicidal and homicidal ideation, denies hallucinations, reports feelings of depression and anxiety and rates them 9/10 on a 0-10 scale, 10 being worst. Pt has been isolating in his room, makes poor eye contact, affect is flat, thoughts logical, pt is guarded and forwards little. Pt is medication compliant. Will continue to monitor pt per Q15 minute face checks and monitor for safety and progress.

## 2020-03-02 NOTE — Progress Notes (Signed)
Psychoeducational Group Note  Date:  03/02/2020 Time:  2026  Group Topic/Focus:  Wrap-Up Group:   The focus of this group is to help patients review their daily goal of treatment and discuss progress on daily workbooks.  Participation Level: Did Not Attend  Participation Quality:  Not Applicable  Affect:  Not Applicable  Cognitive:  Not Applicable  Insight:  Not Applicable  Engagement in Group: Not Applicable  Additional Comments:  The patient did not attend group.   Hazle Coca S 03/02/2020, 8:26 PM

## 2020-03-02 NOTE — [facility] Group Notes (Signed)
Patient did not attend group.

## 2020-03-03 DIAGNOSIS — F314 Bipolar disorder, current episode depressed, severe, without psychotic features: Secondary | ICD-10-CM | POA: Diagnosis not present

## 2020-03-03 NOTE — Progress Notes (Signed)
   03/03/20 0000  Psych Admission Type (Psych Patients Only)  Admission Status Voluntary  Psychosocial Assessment  Patient Complaints None  Eye Contact Brief (Pt asleep at time of assessment)  Facial Expression Other (Comment) (Pt asleep )  Affect Appropriate to circumstance  Speech Logical/coherent;Soft  Interaction Minimal  Motor Activity Slow  Appearance/Hygiene Disheveled  Behavior Characteristics Cooperative  Mood Depressed  Thought Process  Coherency WDL  Content WDL  Delusions None reported or observed  Perception WDL  Hallucination None reported or observed  Judgment UTA  Confusion None  Danger to Self  Current suicidal ideation? Passive  Self-Injurious Behavior No self-injurious ideation or behavior indicators observed or expressed   Agreement Not to Harm Self Yes  Description of Agreement verbal contract  Danger to Others  Danger to Others None reported or observed  D: Patient half asleep in bed upon assessment. Patient presents with depressed affect and minimal interaction. Patient endorses passive SI but verbally contracts for safety. Patient denies HI/AH/VH at this time.  A: Provided positive reinforcement and encouragement.  R: Patient cooperative and receptive to efforts. Patient remains safe on the unit.

## 2020-03-03 NOTE — [facility] Group Notes (Signed)
[facility] LCSW Group Therapy  03/03/2020 2:41 PM  Type of Therapy:  Group Therapy  Participation Level:  Active  Participation Quality:  Appropriate and Attentive  Modes of Intervention:  Discussion, Education and Support  Summary of Progress/Problems: Group was focused on Psychoeducation on CBT therapy, specifically, cognitive distortions. Pts were given handouts about cognitive distortions, behavioral activation plans, and a list of activities.  Pt took handout and followed along throughout group. During the icebreaker, pt shared that he would like find a place to stay upon d/c from [facility]. Pt did not share otherwise throughout the group but did follow along.  Felizardo Hoffmann 03/03/2020, 2:41 PM

## 2020-03-03 NOTE — Progress Notes (Signed)
Pt continues to state he was depressed, pt visible in the dayroom not interacting     03/03/20 2300  Psych Admission Type (Psych Patients Only)  Admission Status Voluntary  Psychosocial Assessment  Patient Complaints Depression  Eye Contact Brief  Facial Expression  (Pt asleep )  Affect Appropriate to circumstance  Speech Logical/coherent;Soft  Interaction Minimal  Motor Activity Slow  Appearance/Hygiene Disheveled  Behavior Characteristics Cooperative  Mood Depressed  Thought Process  Coherency WDL  Content WDL  Delusions None reported or observed  Perception WDL  Hallucination None reported or observed  Judgment UTA  Confusion None  Danger to Self  Current suicidal ideation? Passive  Self-Injurious Behavior No self-injurious ideation or behavior indicators observed or expressed   Agreement Not to Harm Self Yes  Description of Agreement verbal contract  Danger to Others  Danger to Others None reported or observed

## 2020-03-03 NOTE — Progress Notes (Signed)
Recreation Therapy Notes  Date:  11.24.21 Time: 0930 Location: [address] Dayroom  Group Topic: Stress Management  Goal Area(s) Addresses:  Patient will identify positive stress management techniques. Patient will identify benefits of using stress management post d/c.  Intervention: Stress Management  Activity: Meditation.  LRT played a meditation that focused on finding happiness in the present moment.  Patients were to listen and follow along as meditation played in order to fully engage in activity.    Education:  Stress Management, Discharge Planning.   Education Outcome: Acknowledges Education  Clinical Observations/Feedback:  Pt did not attend group activity.    Caroll Rancher, LRT/CTRS        Caroll Rancher A 03/03/2020 11:55 AM

## 2020-03-03 NOTE — Progress Notes (Signed)
Pt is alert and oriented to person, place, time and situation. Pt is calm, cooperative, pleasant, affect is flat, mood is depression, denies anxiety, denies suicidal and homicidal ideation, denies hallucinations. Pt does not initiate interaction with staff or peers, provides brief answers to assessment questions, eye contact is poor. Pt is soft-spoken, spends time resting in his room, at other times is watching tv in the dayroom. Pt is medication compliant. No distress noted, none reported, will continue to monitor pt per Q15 minute face checks and monitor for safety and progress.

## 2020-03-03 NOTE — Progress Notes (Signed)
Patient ID: [name], male   DOB: [birth year], 59 y.o.   MRN: [id number] Patient ID: [name], male   DOB: [birth year], 60 y.o.   MRN: [id number]   [facility] MD Progress Note  03/03/2020 11:40 AM [name]  MRN:  [id number]   Daily Note:  Patient seen, chart reviewed, case discussed with treatment team. Patient denies any adverse effects with medications.  He reports feeling somewhat better although still endorses low mood at times.  Denies any acute issues with the changes in his regimen yesterday.  [facility] and obtain [facility] upon leaving.  No acute issues overnight, patient sleeping fine and eating without issue.  We will continue to monitor and  observe.  Principal Problem: Bipolar disorder, current episode depressed, severe, without psychotic features (HCC) Diagnosis: Principal Problem:   Bipolar disorder, current episode depressed, severe, without psychotic features (HCC) Active Problems:   Polysubstance abuse (HCC)   Stimulant-induced mood disorder (HCC)  Total Time spent with patient: 30 minutes  Past Psychiatric History: bipolar d/o, stimulant use d/o (cocaine)  Past Medical History:  Past Medical History:  Diagnosis Date  . Alcohol abuse   . Anxiety   . Arthritis    "all over" (11/25/2015)  . Bipolar 1 disorder (HCC)   . Bipolar affective disorder (HCC)   . Chronic back pain   . Chronic kidney disease   . Cocaine abuse (HCC)    smokes crack-cocaine  . Depression   . GERD (gastroesophageal reflux disease)   . Gout   . Headache    "q couple days; stress, tension, anger" (11/25/2015)  . Hemorrhoids   . Hepatitis C    "not yet treated" (11/25/2015)  . Hypertension   . Insomnia   . Myocardial infarction [facility]) 11/2015   "supposedly" (11/25/2015)  . OSA on CPAP    "had mask; it was stolen" (11/25/2015)  . PTSD (post-traumatic stress disorder)   . Tobacco abuse   . Vitamin D deficiency     Past  Surgical History:  Procedure Laterality Date  . CARDIAC CATHETERIZATION N/A 11/29/2015   Procedure: Left Heart Cath and Coronary Angiography;  Surgeon: Laurey Morale, MD;  Location: [facility] INVASIVE CV LAB;  Service: Cardiovascular;  Laterality: N/A;  . FRACTURE SURGERY    . PATELLA FRACTURE SURGERY Left ~ 1995  . TONSILLECTOMY     Family History:  Family History  Problem Relation Age of Onset  . Hypertension Brother   . Diabetes Brother   . Hypertension Mother   . Diabetes Mother   . Arthritis Mother   . Other Sister        bone disease   Family Psychiatric  History: none Social History:  Social History   Substance and Sexual Activity  Alcohol Use Yes     Social History   Substance and Sexual Activity  Drug Use Yes  . Types: Marijuana, "Crack" cocaine, Cocaine    Social History   Socioeconomic History  . Marital status: Single    Spouse name: Not on file  . Number of children: Not on file  . Years of education: Not on file  . Highest education level: Not on file  Occupational History  . Not on file  Tobacco Use  . Smoking status: Current Every Day Smoker    Packs/day: 0.25    Years: 46.00    Pack years: 11.50    Types: Cigarettes  . Smokeless  tobacco: Never Used  Vaping Use  . Vaping Use: Never used  Substance and Sexual Activity  . Alcohol use: Yes  . Drug use: Yes    Types: Marijuana, "Crack" cocaine, Cocaine  . Sexual activity: Not Currently  Other Topics Concern  . Not on file  Social History Narrative   ** Merged History Encounter **       Lives alone in [place], [place]   Social Determinants of Health   Financial Resource Strain:   . Difficulty of Paying Living Expenses: Not on file  Food Insecurity:   . Worried About Programme researcher, broadcasting/film/video in the Last Year: Not on file  . Ran Out of Food in the Last Year: Not on file  Transportation Needs:   . Lack of Transportation (Medical): Not on file  . Lack of Transportation (Non-Medical): Not on file   Physical Activity:   . Days of Exercise per Week: Not on file  . Minutes of Exercise per Session: Not on file  Stress:   . Feeling of Stress : Not on file  Social Connections:   . Frequency of Communication with Friends and Family: Not on file  . Frequency of Social Gatherings with Friends and Family: Not on file  . Attends Religious Services: Not on file  . Active Member of Clubs or Organizations: Not on file  . Attends Banker Meetings: Not on file  . Marital Status: Not on file   Additional Social History:      Sleep: Good  Appetite:  Good  Current Medications: Current Facility-Administered Medications  Medication Dose Route Frequency Provider Last Rate Last Admin  . acetaminophen (TYLENOL) tablet 650 mg  650 mg Oral Q6H PRN Antonieta Pert, MD      . alum & mag hydroxide-simeth (MAALOX/MYLANTA) 200-200-20 MG/5ML suspension 30 mL  30 mL Oral Q4H PRN Antonieta Pert, MD      . amLODipine (NORVASC) tablet 10 mg  10 mg Oral Daily Antonieta Pert, MD   10 mg at 03/03/20 0806  . aspirin EC tablet 81 mg  81 mg Oral Daily Antonieta Pert, MD   81 mg at 03/03/20 9735  . cloNIDine (CATAPRES) tablet 0.1 mg  0.1 mg Oral TID PRN Antonieta Pert, MD   0.1 mg at 02/29/20 0919  . cloNIDine (CATAPRES) tablet 0.1 mg  0.1 mg Oral BID Antonieta Pert, MD   0.1 mg at 03/03/20 0806  . clopidogrel (PLAVIX) tablet 75 mg  75 mg Oral Daily Antonieta Pert, MD   75 mg at 03/03/20 3299  . escitalopram (LEXAPRO) tablet 20 mg  20 mg Oral Daily Deannie Resetar, Worthy Rancher, MD   20 mg at 03/03/20 0807  . folic acid (FOLVITE) tablet 1 mg  1 mg Oral Daily Antonieta Pert, MD   1 mg at 03/03/20 2426  . gabapentin (NEURONTIN) capsule 300 mg  300 mg Oral TID Antonieta Pert, MD   300 mg at 03/03/20 8341  . hydrOXYzine (ATARAX/VISTARIL) tablet 25 mg  25 mg Oral TID PRN Antonieta Pert, MD   25 mg at 02/29/20 2109  . lisinopril (ZESTRIL) tablet 40 mg  40 mg Oral Daily Antonieta Pert, MD   40 mg at 03/03/20 9622  . magnesium hydroxide (MILK OF MAGNESIA) suspension 30 mL  30 mL Oral Daily PRN Antonieta Pert, MD      . OLANZapine [facility]) tablet 7.5 mg  7.5 mg Oral QHS Cieanna Stormes,  Worthy Rancher, MD      . pantoprazole (PROTONIX) EC tablet 40 mg  40 mg Oral Daily Antonieta Pert, MD   40 mg at 03/03/20 4315  . pravastatin (PRAVACHOL) tablet 20 mg  20 mg Oral Daily Antonieta Pert, MD   20 mg at 03/03/20 4008  . thiamine tablet 100 mg  100 mg Oral Daily Antonieta Pert, MD   100 mg at 03/03/20 0806  . traZODone (DESYREL) tablet 50 mg  50 mg Oral QHS PRN Antonieta Pert, MD   50 mg at 03/01/20 2118    Lab Results:  No results found for this or any previous visit (from the past 48 hour(s)).  Blood Alcohol level:  Lab Results  Component Value Date   ETH <10 02/26/2020   ETH <10 02/20/2020    Metabolic Disorder Labs: Lab Results  Component Value Date   HGBA1C 5.8 (H) 02/29/2020   MPG 119.76 02/29/2020   MPG 120 11/25/2015   No results found for: PROLACTIN Lab Results  Component Value Date   CHOL 154 02/29/2020   TRIG 121 02/29/2020   HDL 50 02/29/2020   CHOLHDL 3.1 02/29/2020   VLDL 24 02/29/2020   LDLCALC 80 02/29/2020   LDLCALC 80 11/26/2015    Physical Findings: AIMS: Facial and Oral Movements Muscles of Facial Expression: None, normal Lips and Perioral Area: None, normal Jaw: None, normal Tongue: None, normal,Extremity Movements Upper (arms, wrists, hands, fingers): None, normal Lower (legs, knees, ankles, toes): None, normal, Trunk Movements Neck, shoulders, hips: None, normal, Overall Severity Severity of abnormal movements (highest score from questions above): None, normal Incapacitation due to abnormal movements: None, normal Patient's awareness of abnormal movements (rate only patient's report): No Awareness, Dental Status Current problems with teeth and/or dentures?: No Does patient usually wear dentures?: No  CIWA:  CIWA-Ar  Total: 2 COWS:     Musculoskeletal: Strength & Muscle Tone: within normal limits Gait & Station: normal Patient leans: N/A  Psychiatric Specialty Exam: Physical Exam Vitals and nursing note reviewed.  Constitutional:      Appearance: Normal appearance.  HENT:     Head: Normocephalic.     Nose: Nose normal.  Pulmonary:     Effort: Pulmonary effort is normal.  Musculoskeletal:        General: Normal range of motion.     Cervical back: Normal range of motion.  Neurological:     General: No focal deficit present.     Mental Status: He is alert and oriented to person, place, and time.  Psychiatric:        Attention and Perception: Attention and perception normal.        Mood and Affect: Mood is depressed.        Speech: Speech normal.        Behavior: Behavior is slowed.        Thought Content: Thought content includes suicidal ideation.        Cognition and Memory: Cognition and memory normal.        Judgment: Judgment normal.     Review of Systems  Psychiatric/Behavioral: Positive for dysphoric mood.  All other systems reviewed and are negative.   Blood pressure (!) 120/95, pulse (!) 107, temperature (!) 97.5 F (36.4 C), temperature source Oral, resp. rate 12, height 5\' 7"  (1.702 m), weight 78 kg, SpO2 98 %.Body mass index is 26.94 kg/m.  General Appearance: Casual  Eye Contact:  Fair  Speech:  Slow  Volume:  Decreased  Mood:  Depressed  Affect:  Congruent  Thought Process:  Coherent and Descriptions of Associations: Intact  Orientation:  Full (Time, Place, and Person)  Thought Content:  WDL and Logical  Suicidal Thoughts:  Yes.  with intent/plan  Homicidal Thoughts:  No  Memory:  Immediate;   Fair Recent;   Fair Remote;   Fair  Judgement:  Fair  Insight:  Fair  Psychomotor Activity:  Decreased  Concentration:  Concentration: Fair and Attention Span: Fair  Recall:  Good  Fund of Knowledge:  Good  Language:  Good  Akathisia:  No  Handed:  Right  AIMS (if  indicated):     Assets:  Leisure Time Physical Health Resilience  ADL's:  Intact  Cognition:  WNL  Sleep:  Number of Hours: 6.75     Treatment Plan Summary: Daily contact with patient to assess and evaluate symptoms and progress in treatment, Medication management and Plan bipolar affective disorder, depressed, severe without psychotic features:  - Zyprexa   7.5 mg at bedtime - Lexapro to 20 mg daily -clonidine 0.1mg  BID -Plavix 75mg  daily  Stimulant use d/o: -Continue gabapentin 300 mg TID  Anxiety: -Continue hydroxyzine 25 mg TID PRN  Insomnia: -Continue Trazodone 50 mg daily PRN  Clement Sayres, MD 03/03/2020, 11:40 AM  Patient ID: [name], male   DOB: 1960-06-20, 59 y.o.   MRN: [id number]

## 2020-03-04 DIAGNOSIS — F314 Bipolar disorder, current episode depressed, severe, without psychotic features: Secondary | ICD-10-CM | POA: Diagnosis not present

## 2020-03-04 MED ORDER — TRAZODONE HCL 50 MG PO TABS
50.0000 mg | ORAL_TABLET | Freq: Once | ORAL | Status: AC
  Administered 2020-03-04: 50 mg via ORAL
  Filled 2020-03-04 (×2): qty 1

## 2020-03-04 NOTE — Progress Notes (Signed)
[facility] Group Notes:  (Nursing/MHT/Case Management/Adjunct)  Date:  03/04/2020  Time:  2015  Type of Therapy:  wrap up group  Participation Level:  Active  Participation Quality:  Appropriate, Attentive, Sharing and Supportive  Affect:  Flat  Cognitive:  Appropriate  Insight:  Improving  Engagement in Group:  Engaged  Modes of Intervention:  Clarification, Education and Support  Summary of Progress/Problems: Positive thinking and positive change were discussed.   [name] 03/04/2020, 9:02 PM

## 2020-03-04 NOTE — Progress Notes (Signed)
D:  Patient stated he does have SI thoughts today, to walk into traffic.  Contracts for safety.  Denied HI.  Denied A/V hallucinations. A:  Medications administered per MD orders.  Emotional support given to patient. R:  Safety maintained with 15 minute checks. Patient has been in bed most of the day,

## 2020-03-04 NOTE — Plan of Care (Signed)
Nurse discussed anxiety, depression, coping skills with patient. 

## 2020-03-04 NOTE — Progress Notes (Signed)
[facility] MD Progress Note  03/04/2020 11:30 AM [name]  MRN:  [id number] Subjective:  [facility] and he was told "they will call him back." Spoke with the patient about the importance of calling them again tomorrow and being more proactive. Patient acknowledged. Patient endorses SI today and reports that he will walk into traffic. Patient does not endorse HI nor AVH. Patient reports that his appetite is "decent" and that his sleep was "so-so." Patient is also endorsing lower abdominal pain this AM and the pain started last night. Patient was able to stay in bed overnight despite this new pain. Patient also reports that he began having a headache overnight as well but does not report vision disturbances.  Principal Problem: Bipolar disorder, current episode depressed, severe, without psychotic features (HCC) Diagnosis: Principal Problem:   Bipolar disorder, current episode depressed, severe, without psychotic features (HCC) Active Problems:   Polysubstance abuse (HCC)   Medically noncompliant   Abdominal pain   Homeless   Stimulant-induced mood disorder (HCC)  Total Time spent with patient: 15 minutes  Past Psychiatric History: See H&P Past Medical History:  Past Medical History:  Diagnosis Date  . Alcohol abuse   . Anxiety   . Arthritis    "all over" (11/25/2015)  . Bipolar 1 disorder (HCC)   . Bipolar affective disorder (HCC)   . Chronic back pain   . Chronic kidney disease   . Cocaine abuse (HCC)    smokes crack-cocaine  . Depression   . GERD (gastroesophageal reflux disease)   . Gout   . Headache    "q couple days; stress, tension, anger" (11/25/2015)  . Hemorrhoids   . Hepatitis C    "not yet treated" (11/25/2015)  . Hypertension   . Insomnia   . Myocardial infarction [facility]) 11/2015   "supposedly" (11/25/2015)  . OSA on CPAP    "had mask; it was stolen" (11/25/2015)  . PTSD (post-traumatic stress disorder)   . Tobacco  abuse   . Vitamin D deficiency     Past Surgical History:  Procedure Laterality Date  . CARDIAC CATHETERIZATION N/A 11/29/2015   Procedure: Left Heart Cath and Coronary Angiography;  Surgeon: Laurey Morale, MD;  Location: [facility] INVASIVE CV LAB;  Service: Cardiovascular;  Laterality: N/A;  . FRACTURE SURGERY    . PATELLA FRACTURE SURGERY Left ~ 1995  . TONSILLECTOMY     Family History:  Family History  Problem Relation Age of Onset  . Hypertension Brother   . Diabetes Brother   . Hypertension Mother   . Diabetes Mother   . Arthritis Mother   . Other Sister        bone disease   Family Psychiatric  History: See H&P Social History:  Social History   Substance and Sexual Activity  Alcohol Use Yes     Social History   Substance and Sexual Activity  Drug Use Yes  . Types: Marijuana, "Crack" cocaine, Cocaine    Social History   Socioeconomic History  . Marital status: Single    Spouse name: Not on file  . Number of children: Not on file  . Years of education: Not on file  . Highest education level: Not on file  Occupational History  . Not on file  Tobacco Use  . Smoking status: Current Every Day Smoker    Packs/day: 0.25    Years: 46.00    Pack years: 11.50    Types:  Cigarettes  . Smokeless tobacco: Never Used  Vaping Use  . Vaping Use: Never used  Substance and Sexual Activity  . Alcohol use: Yes  . Drug use: Yes    Types: Marijuana, "Crack" cocaine, Cocaine  . Sexual activity: Not Currently  Other Topics Concern  . Not on file  Social History Narrative   ** Merged History Encounter **       Lives alone in [place], [place]   Social Determinants of Health   Financial Resource Strain:   . Difficulty of Paying Living Expenses: Not on file  Food Insecurity:   . Worried About Programme researcher, broadcasting/film/video in the Last Year: Not on file  . Ran Out of Food in the Last Year: Not on file  Transportation Needs:   . Lack of Transportation (Medical): Not on file  . Lack of  Transportation (Non-Medical): Not on file  Physical Activity:   . Days of Exercise per Week: Not on file  . Minutes of Exercise per Session: Not on file  Stress:   . Feeling of Stress : Not on file  Social Connections:   . Frequency of Communication with Friends and Family: Not on file  . Frequency of Social Gatherings with Friends and Family: Not on file  . Attends Religious Services: Not on file  . Active Member of Clubs or Organizations: Not on file  . Attends Banker Meetings: Not on file  . Marital Status: Not on file   Additional Social History:                         Sleep: Fair  Appetite:  Fair  Current Medications: Current Facility-Administered Medications  Medication Dose Route Frequency Provider Last Rate Last Admin  . acetaminophen (TYLENOL) tablet 650 mg  650 mg Oral Q6H PRN Antonieta Pert, MD      . alum & mag hydroxide-simeth (MAALOX/MYLANTA) 200-200-20 MG/5ML suspension 30 mL  30 mL Oral Q4H PRN Antonieta Pert, MD      . amLODipine (NORVASC) tablet 10 mg  10 mg Oral Daily Antonieta Pert, MD   10 mg at 03/04/20 0748  . aspirin EC tablet 81 mg  81 mg Oral Daily Antonieta Pert, MD   81 mg at 03/04/20 0746  . cloNIDine (CATAPRES) tablet 0.1 mg  0.1 mg Oral TID PRN Antonieta Pert, MD   0.1 mg at 02/29/20 0919  . cloNIDine (CATAPRES) tablet 0.1 mg  0.1 mg Oral BID Antonieta Pert, MD   0.1 mg at 03/04/20 0746  . clopidogrel (PLAVIX) tablet 75 mg  75 mg Oral Daily Antonieta Pert, MD   75 mg at 03/04/20 0900  . escitalopram (LEXAPRO) tablet 20 mg  20 mg Oral Daily Cristofano, Worthy Rancher, MD   20 mg at 03/04/20 0746  . folic acid (FOLVITE) tablet 1 mg  1 mg Oral Daily Antonieta Pert, MD   1 mg at 03/04/20 0748  . gabapentin (NEURONTIN) capsule 300 mg  300 mg Oral TID Antonieta Pert, MD   300 mg at 03/04/20 1127  . hydrOXYzine (ATARAX/VISTARIL) tablet 25 mg  25 mg Oral TID PRN Antonieta Pert, MD   25 mg at 02/29/20  2109  . lisinopril (ZESTRIL) tablet 40 mg  40 mg Oral Daily Antonieta Pert, MD   40 mg at 03/04/20 0747  . magnesium hydroxide (MILK OF MAGNESIA) suspension 30 mL  30 mL Oral  Daily PRN Antonieta Pert, MD      . OLANZapine [facility]) tablet 7.5 mg  7.5 mg Oral QHS Cristofano, Worthy Rancher, MD   7.5 mg at 03/03/20 2102  . pantoprazole (PROTONIX) EC tablet 40 mg  40 mg Oral Daily Antonieta Pert, MD   40 mg at 03/04/20 0748  . pravastatin (PRAVACHOL) tablet 20 mg  20 mg Oral Daily Antonieta Pert, MD   20 mg at 03/04/20 0746  . thiamine tablet 100 mg  100 mg Oral Daily Antonieta Pert, MD   100 mg at 03/04/20 0746  . traZODone (DESYREL) tablet 50 mg  50 mg Oral QHS PRN Antonieta Pert, MD   50 mg at 03/03/20 2102    Lab Results: No results found for this or any previous visit (from the past 48 hour(s)).  Blood Alcohol level:  Lab Results  Component Value Date   ETH <10 02/26/2020   ETH <10 02/20/2020    Metabolic Disorder Labs: Lab Results  Component Value Date   HGBA1C 5.8 (H) 02/29/2020   MPG 119.76 02/29/2020   MPG 120 11/25/2015   No results found for: PROLACTIN Lab Results  Component Value Date   CHOL 154 02/29/2020   TRIG 121 02/29/2020   HDL 50 02/29/2020   CHOLHDL 3.1 02/29/2020   VLDL 24 02/29/2020   LDLCALC 80 02/29/2020   LDLCALC 80 11/26/2015    Physical Findings: AIMS: Facial and Oral Movements Muscles of Facial Expression: None, normal Lips and Perioral Area: None, normal Jaw: None, normal Tongue: None, normal,Extremity Movements Upper (arms, wrists, hands, fingers): None, normal Lower (legs, knees, ankles, toes): None, normal, Trunk Movements Neck, shoulders, hips: None, normal, Overall Severity Severity of abnormal movements (highest score from questions above): None, normal Incapacitation due to abnormal movements: None, normal Patient's awareness of abnormal movements (rate only patient's report): No Awareness, Dental Status Current  problems with teeth and/or dentures?: No Does patient usually wear dentures?: No  CIWA:  CIWA-Ar Total: 2 COWS:     Musculoskeletal: Strength & Muscle Tone: within normal limits Gait & Station: normal Patient leans: Front  Psychiatric Specialty Exam: Physical Exam HENT:     Head: Normocephalic and atraumatic.  Pulmonary:     Effort: Pulmonary effort is normal.  Neurological:     Mental Status: He is alert.     Review of Systems  Eyes: Negative for visual disturbance.  Cardiovascular: Negative for chest pain.  Gastrointestinal: Negative for abdominal distention.  Neurological: Positive for headaches.    Blood pressure (!) 116/93, pulse 99, temperature (!) 97.5 F (36.4 C), temperature source Oral, resp. rate 12, height 5\' 7"  (1.702 m), weight 78 kg, SpO2 98 %.Body mass index is 26.94 kg/m.  General Appearance: Casual  Eye Contact:  Fair  Speech:  Clear and Coherent  Volume:  Normal  Mood:  Depressed  Affect:  Depressed  Thought Process:  Coherent  Orientation:  NA  Thought Content:  Logical   Suicidal Thoughts:  Yes.  with intent/plan  Homicidal Thoughts:  No  Memory:  NA  Judgement:  Poor  Insight:  Lacking  Psychomotor Activity:  Decreased  Concentration:  Concentration: Good  Recall:  NA  Fund of Knowledge:  Good  Language:  Good  Akathisia:  No  Handed:  Right  AIMS (if indicated):     Assets:  Leisure Time Physical Health Resilience  ADL's:  Intact  Cognition:  WNL  Sleep:  Number of Hours: 6.75  Treatment Plan Summary: Daily contact with patient to assess and evaluate symptoms and progress in treatment Mr. Viverette continues to endorse SI w/ plan to run into traffic. Per RN patient does leave his room and attends some groups but he seldom interacts. Patient reports that he did reach out to the [name] yesterday, but no progress was made. Concern that patient has low motivation, but have seen some improvement with increase in Lexapro. Will speak with  patient about increasing interaction on the unit and patient has been asked to reach out to [name] again tomorrow. Patient not stable for discharge at this time as he continues to voice SI and continues to appear depressed on the unit.   Bipolar affective disorder, depressed, severe without psychotic features:  - Zyprexa   7.5 mg at bedtime - Lexapro to 20 mg daily, patient does not show signs of mania, has been on 20mg  2 days and was on 5mg  3 days before while on inpatient    Stimulant use d/o: -Continue gabapentin 300 mg TID  Anxiety: -Continue hydroxyzine 25 mg TID PRN  Insomnia: -Continue Trazodone 50 mg daily PRN  Chronic Medical Illness Medications -clonidine 0.1mg  BID -Plavix 75mg  daily -Amlodipine 10mg  daily, HTN - ASA 81 mg -Protonix 40mg  daily, GERD - Pravastatin 20mg  daily, Hyperlipidemia - Thiamine 100mg , Hx substance use disorder, EtoH   Other PRN's -Tylenol 650mg  q6h, Pain -Maalox 61ml q4h, Indigestion -Milk of Mag 26mL, constipation  , MD 03/04/2020, 11:30 AM

## 2020-03-04 NOTE — Progress Notes (Signed)
Adult Psychoeducational Group Note  Date:  03/04/2020 Time:  7:29 AM  Group Topic/Focus:  Wrap-Up Group:   The focus of this group is to help patients review their daily goal of treatment and discuss progress on daily workbooks.  Participation Level:  Active  Participation Quality:  Appropriate  Affect:  Appropriate  Cognitive:  Appropriate  Insight: Appropriate  Engagement in Group:  Engaged  Modes of Intervention:  Discussion  Additional Comments:  Pt attend wrap up group. His day 5 one positive thing he talk to doctor.  Charna Busman Long 03/04/2020, 7:29 AM

## 2020-03-05 DIAGNOSIS — F1410 Cocaine abuse, uncomplicated: Secondary | ICD-10-CM

## 2020-03-05 DIAGNOSIS — R7303 Prediabetes: Secondary | ICD-10-CM

## 2020-03-05 DIAGNOSIS — R109 Unspecified abdominal pain: Secondary | ICD-10-CM

## 2020-03-05 DIAGNOSIS — F1594 Other stimulant use, unspecified with stimulant-induced mood disorder: Secondary | ICD-10-CM

## 2020-03-05 DIAGNOSIS — G8929 Other chronic pain: Secondary | ICD-10-CM

## 2020-03-05 DIAGNOSIS — K219 Gastro-esophageal reflux disease without esophagitis: Secondary | ICD-10-CM

## 2020-03-05 DIAGNOSIS — Z5900 Homelessness unspecified: Secondary | ICD-10-CM

## 2020-03-05 DIAGNOSIS — R45851 Suicidal ideations: Secondary | ICD-10-CM

## 2020-03-05 DIAGNOSIS — F1494 Cocaine use, unspecified with cocaine-induced mood disorder: Secondary | ICD-10-CM

## 2020-03-05 DIAGNOSIS — F1910 Other psychoactive substance abuse, uncomplicated: Secondary | ICD-10-CM

## 2020-03-05 DIAGNOSIS — M549 Dorsalgia, unspecified: Secondary | ICD-10-CM

## 2020-03-05 DIAGNOSIS — R739 Hyperglycemia, unspecified: Secondary | ICD-10-CM | POA: Diagnosis present

## 2020-03-05 DIAGNOSIS — E876 Hypokalemia: Secondary | ICD-10-CM

## 2020-03-05 DIAGNOSIS — I10 Essential (primary) hypertension: Secondary | ICD-10-CM

## 2020-03-05 DIAGNOSIS — Z9119 Patient's noncompliance with other medical treatment and regimen: Secondary | ICD-10-CM

## 2020-03-05 DIAGNOSIS — E559 Vitamin D deficiency, unspecified: Secondary | ICD-10-CM

## 2020-03-05 MED ORDER — POTASSIUM CHLORIDE CRYS ER 20 MEQ PO TBCR
20.0000 meq | EXTENDED_RELEASE_TABLET | Freq: Two times a day (BID) | ORAL | Status: AC
  Administered 2020-03-05 – 2020-03-06 (×2): 20 meq via ORAL
  Filled 2020-03-05 (×4): qty 1

## 2020-03-05 MED ORDER — TRAZODONE HCL 50 MG PO TABS
50.0000 mg | ORAL_TABLET | Freq: Once | ORAL | Status: AC
  Administered 2020-03-05: 50 mg via ORAL
  Filled 2020-03-05 (×2): qty 1

## 2020-03-05 MED ORDER — VITAMIN D3 25 MCG PO TABS
1000.0000 [IU] | ORAL_TABLET | Freq: Every day | ORAL | Status: DC
  Administered 2020-03-05 – 2020-03-09 (×5): 1000 [IU] via ORAL
  Filled 2020-03-05 (×9): qty 1

## 2020-03-05 NOTE — Progress Notes (Signed)
   03/05/20 2056  Psych Admission Type (Psych Patients Only)  Admission Status Voluntary  Psychosocial Assessment  Patient Complaints Depression  Eye Contact Brief  Facial Expression Sad  Affect Sad;Depressed  Speech Logical/coherent;Soft  Interaction Minimal  Motor Activity Slow  Appearance/Hygiene In scrubs  Behavior Characteristics Cooperative;Appropriate to situation  Mood Depressed;Sad  Thought Process  Coherency WDL  Content WDL  Delusions None reported or observed  Perception WDL  Hallucination None reported or observed  Judgment Impaired  Confusion None  Danger to Self  Current suicidal ideation? Passive  Self-Injurious Behavior No self-injurious ideation or behavior indicators observed or expressed   Agreement Not to Harm Self Yes  Description of Agreement Verbal contract  D: Patient presents with sad affect and can be minimal upon interaction. Patient endorses passive SI but verbally contracts for safety. Patient denies HI/AH/VH at this time.  A: Provided positive reinforcement and encouragement.  R: Patient cooperative and receptive to efforts. Patient remains safe on the unit.

## 2020-03-05 NOTE — Progress Notes (Signed)
[facility] MD Progress Note  03/05/2020 4:23 PM [name]  MRN:  [id number]    HPI:  [name] is a 59 y.o. male who presented voluntarily with suicidal ideations with a plan to walk in front of the car.  Patient was also requesting detoxification from alcohol and crack cocaine. Patient states his drug/alcohol use, and his estrangement from family triggered SI. Patient denies HI/AVH. Patient receives outpatient therapy at the [facility] and states he is not currently taking any medications. Patient states he drinks 6-7 beers per day and uses $40-$50 worth of cocaine on a daily basis. Patient states he went to rehab many years ago. Patient presented to [facility] ED in July 2021 with a similar presentation and was ultimately hospitalized at [facility] afterwards. Patient denies trauma history or criminal charges.   Patient is seen, chart is reviewed.  Patient has been isolating in his room and not attending groups.  Subjective: "I am still feeling suicidal."  Patient reports that he continues to feel bad.  He states that he has been staying at an [facility], and living off of his VA disability.  He reports that he has guns at his former house, but does not have access to them.  He has received care through the [facility], but has not gotten mental health support there.  He states that his mother lives in [place], but does not want [place] to contact her.  Patient endorses a history of bipolar disorder, and states that he stays typically depressed.  He is denying cravings for substances today.  He is interested in long-term treatment.  Patient denies any homicidal ideation.  He denies auditory or visual hallucinations.  He is able to contract for safety while hospitalized, but worries that he may make a suicide attempt if discharged.   Objective:  Patient has been provided with resources to contact the [name], and has reportedly called.  He has been compliant with medications, but has not been interacting  in the milieu attending groups.  Nurses report appetite and sleep are fair.  Labs are remarkable for elevated blood sugars, elevated hemoglobin A1c, hypokalemia and history of vitamin D deficiency.   Principal Problem: Bipolar disorder, current episode depressed, severe, without psychotic features (HCC) Diagnosis: Principal Problem:   Bipolar disorder, current episode depressed, severe, without psychotic features (HCC) Active Problems:   Polysubstance abuse (HCC)   Medically noncompliant   Abdominal pain   Homeless   Stimulant-induced mood disorder (HCC)  Total Time spent with patient: 30 minutes  Past Psychiatric History: See H&P Past Medical History:  Past Medical History:  Diagnosis Date  . Alcohol abuse   . Anxiety   . Arthritis    "all over" (11/25/2015)  . Bipolar 1 disorder (HCC)   . Bipolar affective disorder (HCC)   . Chronic back pain   . Chronic kidney disease   . Cocaine abuse (HCC)    smokes crack-cocaine  . Depression   . GERD (gastroesophageal reflux disease)   . Gout   . Headache    "q couple days; stress, tension, anger" (11/25/2015)  . Hemorrhoids   . Hepatitis C    "not yet treated" (11/25/2015)  . Hypertension   . Insomnia   . Myocardial infarction [facility]) 11/2015   "supposedly" (11/25/2015)  . OSA on CPAP    "had mask; it was stolen" (11/25/2015)  . PTSD (post-traumatic stress disorder)   . Tobacco abuse   . Vitamin D deficiency  Past Surgical History:  Procedure Laterality Date  . CARDIAC CATHETERIZATION N/A 11/29/2015   Procedure: Left Heart Cath and Coronary Angiography;  Surgeon: Laurey Morale, MD;  Location: [facility] INVASIVE CV LAB;  Service: Cardiovascular;  Laterality: N/A;  . FRACTURE SURGERY    . PATELLA FRACTURE SURGERY Left ~ 1995  . TONSILLECTOMY     Family History:  Family History  Problem Relation Age of Onset  . Hypertension Brother   . Diabetes Brother   . Hypertension Mother   . Diabetes Mother   . Arthritis Mother   . Other  Sister        bone disease   Family Psychiatric  History: See H&P Social History:  Social History   Substance and Sexual Activity  Alcohol Use Yes     Social History   Substance and Sexual Activity  Drug Use Yes  . Types: Marijuana, "Crack" cocaine, Cocaine    Social History   Socioeconomic History  . Marital status: Single    Spouse name: Not on file  . Number of children: Not on file  . Years of education: Not on file  . Highest education level: Not on file  Occupational History  . Not on file  Tobacco Use  . Smoking status: Current Every Day Smoker    Packs/day: 0.25    Years: 46.00    Pack years: 11.50    Types: Cigarettes  . Smokeless tobacco: Never Used  Vaping Use  . Vaping Use: Never used  Substance and Sexual Activity  . Alcohol use: Yes  . Drug use: Yes    Types: Marijuana, "Crack" cocaine, Cocaine  . Sexual activity: Not Currently  Other Topics Concern  . Not on file  Social History Narrative   ** Merged History Encounter **       Lives alone in [place], [place]   Social Determinants of Health   Financial Resource Strain:   . Difficulty of Paying Living Expenses: Not on file  Food Insecurity:   . Worried About Programme researcher, broadcasting/film/video in the Last Year: Not on file  . Ran Out of Food in the Last Year: Not on file  Transportation Needs:   . Lack of Transportation (Medical): Not on file  . Lack of Transportation (Non-Medical): Not on file  Physical Activity:   . Days of Exercise per Week: Not on file  . Minutes of Exercise per Session: Not on file  Stress:   . Feeling of Stress : Not on file  Social Connections:   . Frequency of Communication with Friends and Family: Not on file  . Frequency of Social Gatherings with Friends and Family: Not on file  . Attends Religious Services: Not on file  . Active Member of Clubs or Organizations: Not on file  . Attends Banker Meetings: Not on file  . Marital Status: Not on file   Additional  Social History:                   Currently homeless      Sleep: Fair  Appetite:  Fair  Current Medications: Current Facility-Administered Medications  Medication Dose Route Frequency Provider Last Rate Last Admin  . acetaminophen (TYLENOL) tablet 650 mg  650 mg Oral Q6H PRN Antonieta Pert, MD      . alum & mag hydroxide-simeth (MAALOX/MYLANTA) 200-200-20 MG/5ML suspension 30 mL  30 mL Oral Q4H PRN Antonieta Pert, MD      . amLODipine (NORVASC) tablet  10 mg  10 mg Oral Daily Antonieta Pert, MD   10 mg at 03/05/20 0804  . aspirin EC tablet 81 mg  81 mg Oral Daily Antonieta Pert, MD   81 mg at 03/05/20 0804  . cloNIDine (CATAPRES) tablet 0.1 mg  0.1 mg Oral TID PRN Antonieta Pert, MD   0.1 mg at 02/29/20 0919  . cloNIDine (CATAPRES) tablet 0.1 mg  0.1 mg Oral BID Antonieta Pert, MD   0.1 mg at 03/05/20 0804  . clopidogrel (PLAVIX) tablet 75 mg  75 mg Oral Daily Antonieta Pert, MD   75 mg at 03/05/20 0804  . escitalopram (LEXAPRO) tablet 20 mg  20 mg Oral Daily Cristofano, Worthy Rancher, MD   20 mg at 03/05/20 0804  . folic acid (FOLVITE) tablet 1 mg  1 mg Oral Daily Antonieta Pert, MD   1 mg at 03/05/20 0804  . gabapentin (NEURONTIN) capsule 300 mg  300 mg Oral TID Antonieta Pert, MD   300 mg at 03/05/20 1245  . hydrOXYzine (ATARAX/VISTARIL) tablet 25 mg  25 mg Oral TID PRN Antonieta Pert, MD   25 mg at 03/04/20 2110  . lisinopril (ZESTRIL) tablet 40 mg  40 mg Oral Daily Antonieta Pert, MD   40 mg at 03/05/20 0803  . magnesium hydroxide (MILK OF MAGNESIA) suspension 30 mL  30 mL Oral Daily PRN Antonieta Pert, MD      . OLANZapine [facility]) tablet 7.5 mg  7.5 mg Oral QHS Cristofano, Worthy Rancher, MD   7.5 mg at 03/04/20 2111  . pantoprazole (PROTONIX) EC tablet 40 mg  40 mg Oral Daily Antonieta Pert, MD   40 mg at 03/05/20 0804  . pravastatin (PRAVACHOL) tablet 20 mg  20 mg Oral Daily Antonieta Pert, MD   20 mg at 03/05/20 0803  . thiamine  tablet 100 mg  100 mg Oral Daily Antonieta Pert, MD   100 mg at 03/05/20 0804  . traZODone (DESYREL) tablet 50 mg  50 mg Oral QHS PRN Antonieta Pert, MD   50 mg at 03/04/20 2110    Lab Results: No results found for this or any previous visit (from the past 48 hour(s)).  Blood Alcohol level:  Lab Results  Component Value Date   ETH <10 02/26/2020   ETH <10 02/20/2020    Metabolic Disorder Labs: Lab Results  Component Value Date   HGBA1C 5.8 (H) 02/29/2020   MPG 119.76 02/29/2020   MPG 120 11/25/2015   No results found for: PROLACTIN Lab Results  Component Value Date   CHOL 154 02/29/2020   TRIG 121 02/29/2020   HDL 50 02/29/2020   CHOLHDL 3.1 02/29/2020   VLDL 24 02/29/2020   LDLCALC 80 02/29/2020   LDLCALC 80 11/26/2015    Physical Findings: AIMS: Facial and Oral Movements Muscles of Facial Expression: None, normal Lips and Perioral Area: None, normal Jaw: None, normal Tongue: None, normal,Extremity Movements Upper (arms, wrists, hands, fingers): None, normal Lower (legs, knees, ankles, toes): None, normal, Trunk Movements Neck, shoulders, hips: None, normal, Overall Severity Severity of abnormal movements (highest score from questions above): None, normal Incapacitation due to abnormal movements: None, normal Patient's awareness of abnormal movements (rate only patient's report): No Awareness, Dental Status Current problems with teeth and/or dentures?: No Does patient usually wear dentures?: No  CIWA:  CIWA-Ar Total: 2 COWS:     Musculoskeletal: Strength & Muscle Tone: within normal limits  Gait & Station: normal Patient leans: Front and N/A  Psychiatric Specialty Exam: Physical Exam Constitutional:      Appearance: Normal appearance.  HENT:     Head: Normocephalic and atraumatic.  Eyes:     Extraocular Movements: Extraocular movements intact.  Cardiovascular:     Rate and Rhythm: Tachycardia present.  Pulmonary:     Effort: Pulmonary effort is  normal.  Musculoskeletal:        General: Normal range of motion.     Cervical back: Normal range of motion.  Neurological:     Mental Status: He is alert.     Review of Systems  Eyes: Negative for visual disturbance.  Respiratory: Negative.   Cardiovascular: Negative.  Negative for chest pain.  Gastrointestinal: Negative.  Negative for abdominal distention.  Neurological: Negative.     Blood pressure (!) 127/92, pulse (!) 116, temperature 97.6 F (36.4 C), temperature source Oral, resp. rate 18, height 5\' 7"  (1.702 m), weight 78 kg, SpO2 99 %.Body mass index is 26.94 kg/m.  General Appearance: Casual  Eye Contact:  Poor  Speech:  Garbled and Normal Rate  Volume:  Decreased  Mood:  Depressed  Affect:  Depressed  Thought Process:  Coherent  Orientation:  Full (Time, Place, and Person)  Thought Content:  Logical   Suicidal Thoughts:  Yes.  with intent/plan  Homicidal Thoughts:  No  Memory:  Immediate;   Fair Recent;   Fair Remote;   Fair  Judgement:  Poor  Insight:  Lacking  Psychomotor Activity:  Decreased  Concentration:  Concentration: Fair and Attention Span: Fair  Recall:  [employer] of Knowledge:  Good  Language:  Good  Akathisia:  No  Handed:  Right  AIMS (if indicated):     Assets:  Leisure Time Physical Health Resilience  ADL's:  Intact  Cognition:  WNL  Sleep:  Number of Hours: 5.5     Treatment Plan Summary: Daily contact with patient to assess and evaluate symptoms and progress in treatment Mr. Suchecki continues to endorse SI w/ plan to run into traffic. Per RN patient does leave his room and attends some groups but he seldom interacts. Patient reports that he did reach out to the [name], but no progress was made. Concern that patient has low motivation, but have seen some improvement with increase in Lexapro. Will speak with patient about increasing interaction on the unit and patient has been asked to reach out to [name] again. Patient not stable for discharge  at this time as he continues to voice SI and continues to appear depressed on the unit.   Labs are remarkable for elevated blood sugars, elevated hemoglobin A1c, hypokalemia and history of vitamin D deficiency.  Bipolar affective disorder, depressed, severe without psychotic features:  - Zyprexa   7.5 mg at bedtime - Lexapro to 20 mg daily, patient does not show signs of mania, has been on 20mg  2 days and was on 5mg  3 days before while on inpatient    Stimulant use d/o: -Continue gabapentin 300 mg TID  Anxiety: -Continue hydroxyzine 25 mg TID PRN  Insomnia: -Continue Trazodone 50 mg daily PRN  Chronic Medical Illness Medications -clonidine 0.1mg  BID -Plavix 75mg  daily -Amlodipine 10mg  daily, HTN - ASA 81 mg -Protonix 40mg  daily, GERD - Pravastatin 20mg  daily, Hyperlipidemia - Thiamine 100mg , Hx substance use disorder, EtoH  -Start vitamin D 1000 IUs daily -K. Dur 20 mEq twice daily for two doses then recheck BMP  Other PRN's -Tylenol 650mg   q6h, Pain -Maalox 19ml q4h, Indigestion -Milk of Mag 34mL, constipation  Mariel Craft, MD 03/05/2020, 4:23 PM

## 2020-03-05 NOTE — Progress Notes (Signed)
   03/04/20 2110  Psych Admission Type (Psych Patients Only)  Admission Status Voluntary  Psychosocial Assessment  Patient Complaints Depression  Eye Contact Brief  Facial Expression Sad  Affect Sad  Speech Logical/coherent  Interaction Minimal  Motor Activity Slow  Appearance/Hygiene Disheveled  Behavior Characteristics Cooperative  Mood Depressed;Anxious  Thought Process  Coherency WDL  Content WDL  Delusions None reported or observed  Perception WDL  Hallucination None reported or observed  Judgment Impaired  Confusion None  Danger to Self  Current suicidal ideation? Passive  Self-Injurious Behavior No self-injurious ideation or behavior indicators observed or expressed   Agreement Not to Harm Self Yes  Description of Agreement Verbal Contract  D: Patient presents with sad and depressed affect. Patient is positive for passive SI. Patient denies HI/AH/VH at this time. Patient contracts for safety.    A: Provided positive reinforcement and encouragement.  R: Patient cooperative and receptive to efforts. Patient remains safe on the unit.

## 2020-03-05 NOTE — Tx Team (Signed)
Interdisciplinary Treatment and Diagnostic Plan Update  03/05/2020 Time of Session: 9:25am [name] MRN: [id number]  Principal Diagnosis: Bipolar disorder, current episode depressed, severe, without psychotic features (HCC)  Secondary Diagnoses: Principal Problem:   Bipolar disorder, current episode depressed, severe, without psychotic features (HCC) Active Problems:   Polysubstance abuse (HCC)   Medically noncompliant   Abdominal pain   Homeless   Stimulant-induced mood disorder (HCC)   Current Medications:  Current Facility-Administered Medications  Medication Dose Route Frequency Provider Last Rate Last Admin  . acetaminophen (TYLENOL) tablet 650 mg  650 mg Oral Q6H PRN Antonieta Pert, MD      . alum & mag hydroxide-simeth (MAALOX/MYLANTA) 200-200-20 MG/5ML suspension 30 mL  30 mL Oral Q4H PRN Antonieta Pert, MD      . amLODipine (NORVASC) tablet 10 mg  10 mg Oral Daily Antonieta Pert, MD   10 mg at 03/05/20 0804  . aspirin EC tablet 81 mg  81 mg Oral Daily Antonieta Pert, MD   81 mg at 03/05/20 0804  . cloNIDine (CATAPRES) tablet 0.1 mg  0.1 mg Oral TID PRN Antonieta Pert, MD   0.1 mg at 02/29/20 0919  . cloNIDine (CATAPRES) tablet 0.1 mg  0.1 mg Oral BID Antonieta Pert, MD   0.1 mg at 03/05/20 0804  . clopidogrel (PLAVIX) tablet 75 mg  75 mg Oral Daily Antonieta Pert, MD   75 mg at 03/05/20 0804  . escitalopram (LEXAPRO) tablet 20 mg  20 mg Oral Daily Cristofano, Worthy Rancher, MD   20 mg at 03/05/20 0804  . folic acid (FOLVITE) tablet 1 mg  1 mg Oral Daily Antonieta Pert, MD   1 mg at 03/05/20 0804  . gabapentin (NEURONTIN) capsule 300 mg  300 mg Oral TID Antonieta Pert, MD   300 mg at 03/05/20 0803  . hydrOXYzine (ATARAX/VISTARIL) tablet 25 mg  25 mg Oral TID PRN Antonieta Pert, MD   25 mg at 03/04/20 2110  . lisinopril (ZESTRIL) tablet 40 mg  40 mg Oral Daily Antonieta Pert, MD   40 mg at 03/05/20 0803  . magnesium hydroxide (MILK OF  MAGNESIA) suspension 30 mL  30 mL Oral Daily PRN Antonieta Pert, MD      . OLANZapine [facility]) tablet 7.5 mg  7.5 mg Oral QHS Cristofano, Worthy Rancher, MD   7.5 mg at 03/04/20 2111  . pantoprazole (PROTONIX) EC tablet 40 mg  40 mg Oral Daily Antonieta Pert, MD   40 mg at 03/05/20 0804  . pravastatin (PRAVACHOL) tablet 20 mg  20 mg Oral Daily Antonieta Pert, MD   20 mg at 03/05/20 0803  . thiamine tablet 100 mg  100 mg Oral Daily Antonieta Pert, MD   100 mg at 03/05/20 0804  . traZODone (DESYREL) tablet 50 mg  50 mg Oral QHS PRN Antonieta Pert, MD   50 mg at 03/04/20 2110   PTA Medications: Medications Prior to Admission  Medication Sig Dispense Refill Last Dose  . amLODipine (NORVASC) 10 MG tablet Take 1 tablet (10 mg total) by mouth daily. (Patient not taking: Reported on 02/26/2020) 30 tablet 0   . aspirin EC 81 MG tablet Take 1 tablet (81 mg total) by mouth daily. (Patient not taking: Reported on 10/27/2019) 30 tablet 3   . clopidogrel (PLAVIX) 75 MG tablet Take 1 tablet (75 mg total) by mouth daily. (Patient not taking: Reported on 02/26/2020) 30  tablet 0   . lisinopril (ZESTRIL) 40 MG tablet Take 1 tablet (40 mg total) by mouth daily. (Patient not taking: Reported on 02/26/2020) 30 tablet 0   . OLANZapine (ZYPREXA) 10 MG tablet Take 1 tablet (10 mg total) by mouth 2 (two) times daily. (Patient not taking: Reported on 02/26/2020) 60 tablet 3   . pravastatin (PRAVACHOL) 20 MG tablet Take 1 tablet (20 mg total) by mouth daily. (Patient not taking: Reported on 02/26/2020) 30 tablet 3     Patient Stressors: Financial difficulties Health problems Medication change or noncompliance Substance abuse  Patient Strengths: Ability for insight Wellsite geologist fund of knowledge  Treatment Modalities: Medication Management, Group therapy, Case management,  1 to 1 session with clinician, Psychoeducation, Recreational therapy.   Physician Treatment Plan for Primary  Diagnosis: Bipolar disorder, current episode depressed, severe, without psychotic features (HCC) Long Term Goal(s): Improvement in symptoms so as ready for discharge Improvement in symptoms so as ready for discharge   Short Term Goals: Ability to identify changes in lifestyle to reduce recurrence of condition will improve Ability to verbalize feelings will improve Ability to disclose and discuss suicidal ideas Ability to demonstrate self-control will improve Ability to identify and develop effective coping behaviors will improve Compliance with prescribed medications will improve Ability to identify triggers associated with substance abuse/mental health issues will improve Ability to identify changes in lifestyle to reduce recurrence of condition will improve Ability to verbalize feelings will improve Ability to disclose and discuss suicidal ideas Ability to demonstrate self-control will improve Ability to identify and develop effective coping behaviors will improve Compliance with prescribed medications will improve Ability to identify triggers associated with substance abuse/mental health issues will improve  Medication Management: Evaluate patient's response, side effects, and tolerance of medication regimen.  Therapeutic Interventions: 1 to 1 sessions, Unit Group sessions and Medication administration.  Evaluation of Outcomes: Progressing  Physician Treatment Plan for Secondary Diagnosis: Principal Problem:   Bipolar disorder, current episode depressed, severe, without psychotic features (HCC) Active Problems:   Polysubstance abuse (HCC)   Medically noncompliant   Abdominal pain   Homeless   Stimulant-induced mood disorder (HCC)  Long Term Goal(s): Improvement in symptoms so as ready for discharge Improvement in symptoms so as ready for discharge   Short Term Goals: Ability to identify changes in lifestyle to reduce recurrence of condition will improve Ability to verbalize  feelings will improve Ability to disclose and discuss suicidal ideas Ability to demonstrate self-control will improve Ability to identify and develop effective coping behaviors will improve Compliance with prescribed medications will improve Ability to identify triggers associated with substance abuse/mental health issues will improve Ability to identify changes in lifestyle to reduce recurrence of condition will improve Ability to verbalize feelings will improve Ability to disclose and discuss suicidal ideas Ability to demonstrate self-control will improve Ability to identify and develop effective coping behaviors will improve Compliance with prescribed medications will improve Ability to identify triggers associated with substance abuse/mental health issues will improve     Medication Management: Evaluate patient's response, side effects, and tolerance of medication regimen.  Therapeutic Interventions: 1 to 1 sessions, Unit Group sessions and Medication administration.  Evaluation of Outcomes: Progressing   RN Treatment Plan for Primary Diagnosis: Bipolar disorder, current episode depressed, severe, without psychotic features (HCC) Long Term Goal(s): Knowledge of disease and therapeutic regimen to maintain health will improve  Short Term Goals: Ability to remain free from injury will improve, Ability to participate in decision making will  improve, Ability to verbalize feelings will improve, Ability to disclose and discuss suicidal ideas and Ability to identify and develop effective coping behaviors will improve  Medication Management: RN will administer medications as ordered by provider, will assess and evaluate patient's response and provide education to patient for prescribed medication. RN will report any adverse and/or side effects to prescribing provider.  Therapeutic Interventions: 1 on 1 counseling sessions, Psychoeducation, Medication administration, Evaluate responses to  treatment, Monitor vital signs and CBGs as ordered, Perform/monitor CIWA, COWS, AIMS and Fall Risk screenings as ordered, Perform wound care treatments as ordered.  Evaluation of Outcomes: Progressing   LCSW Treatment Plan for Primary Diagnosis: Bipolar disorder, current episode depressed, severe, without psychotic features (HCC) Long Term Goal(s): Safe transition to appropriate next level of care at discharge, Engage patient in therapeutic group addressing interpersonal concerns.  Short Term Goals: Engage patient in aftercare planning with referrals and resources, Increase social support, Increase ability to appropriately verbalize feelings, Facilitate acceptance of mental health diagnosis and concerns, Facilitate patient progression through stages of change regarding substance use diagnoses and concerns and Identify triggers associated with mental health/substance abuse issues  Therapeutic Interventions: Assess for all discharge needs, 1 to 1 time with Social worker, Explore available resources and support systems, Assess for adequacy in community support network, Educate family and significant other(s) on suicide prevention, Complete Psychosocial Assessment, Interpersonal group therapy.  Evaluation of Outcomes: Progressing   Progress in Treatment: Attending groups: No. Participating in groups: No. Taking medication as prescribed: Yes. Toleration medication: Yes. Family/Significant other contact made: No, will contact:  Consents not provided Patient understands diagnosis: Yes. Discussing patient identified problems/goals with staff: Yes. Medical problems stabilized or resolved: Yes. Denies suicidal/homicidal ideation: Yes. Issues/concerns per patient self-inventory: No.   New problem(s) identified: No, Describe:  None  New Short Term/Long Term Goal(s): medication stabilization, elimination of SI thoughts, development of comprehensive mental wellness plan.   Patient Goals: Did not  attend   Discharge Plan or Barriers: [facility] He has been provided with an [employer]   Reason for Continuation of Hospitalization: Depression Medication stabilization Suicidal ideation  Estimated Length of Stay: 3 to 5 days  Attendees: Patient:  03/01/2020   Physician:  03/01/2020   Nursing:  03/01/2020   RN Care Manager: 03/01/2020   Social Worker: Jacinta Shoe, LCSW 03/01/2020   Recreational Therapist:  03/01/2020   Other:  03/01/2020   Other:  03/01/2020   Other: 03/01/2020     Scribe for Treatment Team: Jacinta Shoe, LCSW 03/05/2020 8:33 AM

## 2020-03-05 NOTE — Progress Notes (Signed)
The patient attended the evening A.A.meeting and was appropriate.  

## 2020-03-05 NOTE — [facility] Counselor (Signed)
LCSW Group Therapy Note  03/05/2020   1430  Type of Therapy and Topic:  Group Therapy: What's So Funny About Mental Illness?  Participation Level:  Active   Description of Group:   In this group, patients learned how to recognize how they personally define their mental illness. This group also addressed stigma associated with mental illness.  Patients were asked to give examples of experiences surrounding living with a mental illness and how it makes them feel. Patients were asked to self-reflect on how they have chosen to address their mental health thus far and were invited to share those lessons or to discuss how stigma has affected their mental health care. Patients actively explore how their mental illness has impacted decisions and actions as well as how future decisions can be impacted.  Therapeutic Goals: 1. Patients will identify what can be considered mental illness 2. Patients will identify lessons learned from past experiences and how they can be applied to future struggles. 3. Patients will establish rapport with peers in a therapeutic setting.  Summary of Patient Progress:  [name] shared his understanding and/or definition of mental illness. The patient shared their experience with family and how it has impacted their mental health. The patient shared if they reframed their idea of mental illness this would change how they interacted with family both currently and in the future.  Therapeutic Modalities:   Cognitive Behavioral Therapy    Jacinta Shoe, LCSW 03/05/2020  2:49 PM

## 2020-03-05 NOTE — Progress Notes (Signed)
Recreation Therapy Notes  Date:  11.26.21 Time: 0930 Location: 300 Hall Group Room  Group Topic: Stress Management  Goal Area(s) Addresses:  Patient will identify positive stress management techniques. Patient will identify benefits of using stress management post d/c.  Intervention: Stress Management  Activity: Meditation.  LRT played a meditation that focused on making the most of your day and the possibilities that possibly await as the day goes on.    Education:  Stress Management, Discharge Planning.   Education Outcome: Acknowledges Education  Clinical Observations/Feedback: Pt did not attend group session.    Caroll Rancher, LRT/CTRS         Caroll Rancher A 03/05/2020 11:31 AM

## 2020-03-05 NOTE — Progress Notes (Signed)
   03/05/20 1100  Psych Admission Type (Psych Patients Only)  Admission Status Voluntary  Psychosocial Assessment  Patient Complaints Depression  Eye Contact Brief  Facial Expression Sad  Affect Sad  Speech Logical/coherent  Interaction Minimal  Motor Activity Slow  Appearance/Hygiene Disheveled  Behavior Characteristics Cooperative  Mood Depressed  Thought Process  Coherency WDL  Content WDL  Delusions None reported or observed  Perception WDL  Hallucination None reported or observed  Judgment Impaired  Confusion None  Danger to Self  Current suicidal ideation? Passive  Self-Injurious Behavior No self-injurious ideation or behavior indicators observed or expressed   Agreement Not to Harm Self Yes  Description of Agreement Verbal Contract

## 2020-03-06 LAB — BASIC METABOLIC PANEL
Anion gap: 10 (ref 5–15)
BUN: 17 mg/dL (ref 6–20)
CO2: 22 mmol/L (ref 22–32)
Calcium: 9.6 mg/dL (ref 8.9–10.3)
Chloride: 104 mmol/L (ref 98–111)
Creatinine, Ser: 0.97 mg/dL (ref 0.61–1.24)
GFR, Estimated: >60 mL/min (ref >60)
Glucose, Bld: 128 mg/dL — ABNORMAL HIGH (ref 70–99)
Potassium: 4.6 mmol/L (ref 3.5–5.1)
Sodium: 136 mmol/L (ref 135–145)

## 2020-03-06 MED ORDER — TRAZODONE HCL 50 MG PO TABS
50.0000 mg | ORAL_TABLET | Freq: Once | ORAL | Status: AC
  Administered 2020-03-06: 50 mg via ORAL
  Filled 2020-03-06 (×2): qty 1

## 2020-03-06 MED ORDER — TRAZODONE HCL 50 MG PO TABS: 50.0000 mg | ORAL_TABLET | Freq: Every day | ORAL | Status: DC

## 2020-03-06 NOTE — Progress Notes (Signed)
Patient ID: [name], male   DOB: 09-26-1960, 59 y.o.   MRN: [id number] D: Assumed care patient @ 2330. Patient in bed sleeping. Respiration regular and unlabored. No sign of distress noted at this time A: 15 mins checks for safety. R: Patient remains safe.

## 2020-03-06 NOTE — Progress Notes (Signed)
[facility] MD Progress Note  03/06/2020 11:10 AM DAXTER PAULE  MRN:  [id number]    HPI:  [name] is a 59 y.o. male who presented voluntarily with suicidal ideations with a plan to walk in front of the car.  Patient was also requesting detoxification from alcohol and crack cocaine. Patient states his drug/alcohol use, and his estrangement from family triggered SI. Patient denies HI/AVH. Patient receives outpatient therapy at the [facility] and states he is not currently taking any medications. Patient states he drinks 6-7 beers per day and uses $40-$50 worth of cocaine on a daily basis. Patient states he went to rehab many years ago. Patient presented to [facility] ED in July 2021 with a similar presentation and was ultimately hospitalized at [facility] afterwards. Patient denies trauma history or criminal charges.   Patient is seen, chart is reviewed.  Patient has been isolating in his room and not attending groups.  Subjective: "I am still feeling suicidal, my plan is to run into traffic" .  Patient states he is not sure what he will do when let go at this time.   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  He will continue to [facility] and [facility].  Patient states he has family members but has no connection with them.  This morning he reports his stressor is that he does not like his life style of Alcohol and drug use.  Objective:  Patient has resources to contact the [name] and is encouraged to do so.  He is not leaving his room except for medications and meals.  He barely participate in group activities.   Writer encouraged patient to join peers in group activities.  We will continue to monitor patient.  Principal Problem: Bipolar disorder, current episode depressed, severe, without psychotic features (HCC) Diagnosis: Principal Problem:   Bipolar disorder, current episode depressed, severe, without psychotic features (HCC) Active Problems:   Polysubstance abuse  (HCC)   Vitamin D deficiency   Medically noncompliant   Abdominal pain   GERD (gastroesophageal reflux disease)   Back pain, chronic   Homeless   Essential hypertension   Stimulant-induced mood disorder (HCC)   Cocaine abuse (HCC)   Hypokalemia   Pre-diabetes   Elevated blood sugar  Total Time spent with patient: 20 minutes  Past Psychiatric History: See H&P Past Medical History:  Past Medical History:  Diagnosis Date  . Alcohol abuse   . Anxiety   . Arthritis    "all over" (11/25/2015)  . Bipolar 1 disorder (HCC)   . Bipolar affective disorder (HCC)   . Chronic back pain   . Chronic kidney disease   . Cocaine abuse (HCC)    smokes crack-cocaine  . Depression   . GERD (gastroesophageal reflux disease)   . Gout   . Headache    "q couple days; stress, tension, anger" (11/25/2015)  . Hemorrhoids   . Hepatitis C    "not yet treated" (11/25/2015)  . Hypertension   . Insomnia   . Myocardial infarction [facility]) 11/2015   "supposedly" (11/25/2015)  . OSA on CPAP    "had mask; it was stolen" (11/25/2015)  . PTSD (post-traumatic stress disorder)   . Tobacco abuse   . Vitamin D deficiency     Past Surgical History:  Procedure Laterality Date  . CARDIAC CATHETERIZATION N/A 11/29/2015   Procedure: Left Heart Cath and Coronary Angiography;  Surgeon: Laurey Morale, MD;  Location: [facility] INVASIVE CV LAB;  Service: Cardiovascular;  Laterality: N/A;  . FRACTURE SURGERY    . PATELLA FRACTURE SURGERY Left ~ 1995  . TONSILLECTOMY     Family History:  Family History  Problem Relation Age of Onset  . Hypertension Brother   . Diabetes Brother   . Hypertension Mother   . Diabetes Mother   . Arthritis Mother   . Other Sister        bone disease   Family Psychiatric  History: See H&P Social History:  Social History   Substance and Sexual Activity  Alcohol Use Yes     Social History   Substance and Sexual Activity  Drug Use Yes  . Types: Marijuana, "Crack" cocaine, Cocaine     Social History   Socioeconomic History  . Marital status: Single    Spouse name: Not on file  . Number of children: Not on file  . Years of education: Not on file  . Highest education level: Not on file  Occupational History  . Not on file  Tobacco Use  . Smoking status: Current Every Day Smoker    Packs/day: 0.25    Years: 46.00    Pack years: 11.50    Types: Cigarettes  . Smokeless tobacco: Never Used  Vaping Use  . Vaping Use: Never used  Substance and Sexual Activity  . Alcohol use: Yes  . Drug use: Yes    Types: Marijuana, "Crack" cocaine, Cocaine  . Sexual activity: Not Currently  Other Topics Concern  . Not on file  Social History Narrative   ** Merged History Encounter **       Lives alone in [place], [place]   Social Determinants of Health   Financial Resource Strain:   . Difficulty of Paying Living Expenses: Not on file  Food Insecurity:   . Worried About Programme researcher, broadcasting/film/video in the Last Year: Not on file  . Ran Out of Food in the Last Year: Not on file  Transportation Needs:   . Lack of Transportation (Medical): Not on file  . Lack of Transportation (Non-Medical): Not on file  Physical Activity:   . Days of Exercise per Week: Not on file  . Minutes of Exercise per Session: Not on file  Stress:   . Feeling of Stress : Not on file  Social Connections:   . Frequency of Communication with Friends and Family: Not on file  . Frequency of Social Gatherings with Friends and Family: Not on file  . Attends Religious Services: Not on file  . Active Member of Clubs or Organizations: Not on file  . Attends Banker Meetings: Not on file  . Marital Status: Not on file   Additional Social History:                   Currently homeless      Sleep: Fair  Appetite:  Fair  Current Medications: Current Facility-Administered Medications  Medication Dose Route Frequency Provider Last Rate Last Admin  . acetaminophen (TYLENOL) tablet 650 mg  650  mg Oral Q6H PRN Antonieta Pert, MD      . alum & mag hydroxide-simeth (MAALOX/MYLANTA) 200-200-20 MG/5ML suspension 30 mL  30 mL Oral Q4H PRN Antonieta Pert, MD      . amLODipine (NORVASC) tablet 10 mg  10 mg Oral Daily Antonieta Pert, MD   10 mg at 03/06/20 0811  . aspirin EC tablet 81 mg  81 mg Oral  Daily Antonieta Pert, MD   81 mg at 03/06/20 0810  . cloNIDine (CATAPRES) tablet 0.1 mg  0.1 mg Oral TID PRN Antonieta Pert, MD   0.1 mg at 02/29/20 0919  . cloNIDine (CATAPRES) tablet 0.1 mg  0.1 mg Oral BID Antonieta Pert, MD   0.1 mg at 03/06/20 0810  . clopidogrel (PLAVIX) tablet 75 mg  75 mg Oral Daily Antonieta Pert, MD   75 mg at 03/06/20 0810  . escitalopram (LEXAPRO) tablet 20 mg  20 mg Oral Daily Cristofano, Worthy Rancher, MD   20 mg at 03/06/20 0810  . folic acid (FOLVITE) tablet 1 mg  1 mg Oral Daily Antonieta Pert, MD   1 mg at 03/06/20 2585  . gabapentin (NEURONTIN) capsule 300 mg  300 mg Oral TID Antonieta Pert, MD   300 mg at 03/06/20 2778  . hydrOXYzine (ATARAX/VISTARIL) tablet 25 mg  25 mg Oral TID PRN Antonieta Pert, MD   25 mg at 03/05/20 2117  . lisinopril (ZESTRIL) tablet 40 mg  40 mg Oral Daily Antonieta Pert, MD   40 mg at 03/06/20 0811  . magnesium hydroxide (MILK OF MAGNESIA) suspension 30 mL  30 mL Oral Daily PRN Antonieta Pert, MD      . OLANZapine [facility]) tablet 7.5 mg  7.5 mg Oral QHS Cristofano, Worthy Rancher, MD   7.5 mg at 03/05/20 2117  . pantoprazole (PROTONIX) EC tablet 40 mg  40 mg Oral Daily Antonieta Pert, MD   40 mg at 03/06/20 0811  . pravastatin (PRAVACHOL) tablet 20 mg  20 mg Oral Daily Antonieta Pert, MD   20 mg at 03/06/20 0811  . thiamine tablet 100 mg  100 mg Oral Daily Antonieta Pert, MD   100 mg at 03/06/20 2423  . traZODone (DESYREL) tablet 50 mg  50 mg Oral QHS PRN Antonieta Pert, MD   50 mg at 03/05/20 2117  . Vitamin D3 (Vitamin D) tablet 1,000 Units  1,000 Units Oral Daily Mariel Craft, MD    1,000 Units at 03/06/20 5361    Lab Results: No results found for this or any previous visit (from the past 48 hour(s)).  Blood Alcohol level:  Lab Results  Component Value Date   ETH <10 02/26/2020   ETH <10 02/20/2020    Metabolic Disorder Labs: Lab Results  Component Value Date   HGBA1C 5.8 (H) 02/29/2020   MPG 119.76 02/29/2020   MPG 120 11/25/2015   No results found for: PROLACTIN Lab Results  Component Value Date   CHOL 154 02/29/2020   TRIG 121 02/29/2020   HDL 50 02/29/2020   CHOLHDL 3.1 02/29/2020   VLDL 24 02/29/2020   LDLCALC 80 02/29/2020   LDLCALC 80 11/26/2015    Physical Findings: AIMS: Facial and Oral Movements Muscles of Facial Expression: None, normal Lips and Perioral Area: None, normal Jaw: None, normal Tongue: None, normal,Extremity Movements Upper (arms, wrists, hands, fingers): None, normal Lower (legs, knees, ankles, toes): None, normal, Trunk Movements Neck, shoulders, hips: None, normal, Overall Severity Severity of abnormal movements (highest score from questions above): None, normal Incapacitation due to abnormal movements: None, normal Patient's awareness of abnormal movements (rate only patient's report): No Awareness, Dental Status Current problems with teeth and/or dentures?: No Does patient usually wear dentures?: No  CIWA:  CIWA-Ar Total: 2 COWS:     Musculoskeletal: Strength & Muscle Tone: within normal limits Gait & Station: normal  Patient leans: Front and N/A  Psychiatric Specialty Exam: Physical Exam Constitutional:      Appearance: Normal appearance.  HENT:     Head: Normocephalic and atraumatic.  Eyes:     Extraocular Movements: Extraocular movements intact.  Pulmonary:     Effort: Pulmonary effort is normal.  Musculoskeletal:        General: Normal range of motion.     Cervical back: Normal range of motion.  Neurological:     Mental Status: He is alert.     Review of Systems  Eyes: Negative for visual  disturbance.  Respiratory: Negative.   Cardiovascular: Negative.  Negative for chest pain.  Gastrointestinal: Negative.  Negative for abdominal distention.  Neurological: Negative.     Blood pressure 120/81, pulse 90, temperature 97.6 F (36.4 C), temperature source Oral, resp. rate 18, height 5\' 7"  (1.702 m), weight 78 kg, SpO2 99 %.Body mass index is 26.94 kg/m.  General Appearance: Casual  Eye Contact:  Poor  Speech:  Garbled and Normal Rate  Volume:  Decreased  Mood:  Depressed  Affect:  Depressed  Thought Process:  Coherent  Orientation:  Full (Time, Place, and Person)  Thought Content:  Logical   Suicidal Thoughts:  Yes.  with intent/plan  Homicidal Thoughts:  No  Memory:  Immediate;   Fair Recent;   Fair Remote;   Fair  Judgement:  Poor  Insight:  Lacking  Psychomotor Activity:  Decreased  Concentration:  Concentration: Fair and Attention Span: Fair  Recall:  Fair  Fund of Knowledge:  Good  Language:  Good  Akathisia:  No  Handed:  Right  AIMS (if indicated):     Assets:  Leisure Time Physical Health Resilience  ADL's:  Intact  Cognition:  WNL  Sleep:  Number of Hours: 6     Treatment Plan Summary: Daily contact with patient to assess and evaluate symptoms and progress in treatment Mr. Bolla continues to endorse SI w/ plan to run into traffic. Per RN patient does leave his room and attends some groups but he seldom interacts with peers.  Patient was sen in his room while group activities were on.  He reports feeling depressed and his affect is depressed with limited eye contact.  Bipolar affective disorder, depressed, severe without psychotic features:   - Zyprexa   7.5 mg at bedtime - Lexapro to 20 mg daily, patient does not show signs of mania, has been on 20mg  2 days and was on 5mg  3 days before while on inpatient    Stimulant use d/o: -Continue gabapentin 300 mg TID  Anxiety: -Continue hydroxyzine 25 mg TID PRN  Insomnia: -Continue Trazodone 50  mg daily PRN  Chronic Medical Illness Medications -clonidine 0.1mg  BID -Plavix 75mg  daily -Amlodipine 10mg  daily, HTN - ASA 81 mg -Protonix 40mg  daily, GERD - Pravastatin 20mg  daily, Hyperlipidemia - Thiamine 100mg , Hx substance use disorder, EtoH  -Continue  vitamin D 1000 IUs daily - recheck BMP 03/06/2020 evening draw.  Other PRN's -Tylenol 650mg  q6h, Pain -Maalox 19ml q4h, Indigestion -Milk of Mag 3[facility]0Selena697mBattenLUnited Arab EmJuanetta G52mRochelle Community HospitalslSelena57mBattenIUnited Arab EmJuanetta G16mMesquite Specialty HospitalslSelena69mBattenIUnited Arab EmJuanetta G48mSaint Clares Hospital - DenvilleslSelena76mBattenIUnited Arab EmJuanetta G26mslIhor Austintion  Aaralynn Shepheard C Omolara Carol, NP 03/06/2020, 11:10 AM

## 2020-03-06 NOTE — Progress Notes (Signed)
[place] NOVEL CORONAVIRUS (COVID-19) DAILY CHECK-OFF SYMPTOMS - answer yes or no to each - every day NO YES  Have you had a fever in the past 24 hours?  . Fever (Temp > 37.80C / 100F) X   Have you had any of these symptoms in the past 24 hours? . New Cough .  Sore Throat  .  Shortness of Breath .  Difficulty Breathing .  Unexplained Body Aches   X   Have you had any one of these symptoms in the past 24 hours not related to allergies?   . Runny Nose .  Nasal Congestion .  Sneezing   X   If you have had runny nose, nasal congestion, sneezing in the past 24 hours, has it worsened?  X   EXPOSURES - check yes or no X   Have you traveled outside the state in the past 14 days?  X   Have you been in contact with someone with a confirmed diagnosis of COVID-19 or PUI in the past 14 days without wearing appropriate PPE?  X   Have you been living in the same home as a person with confirmed diagnosis of COVID-19 or a PUI (household contact)?    X   Have you been diagnosed with COVID-19?    X              What to do next: Answered NO to all: Answered YES to anything:   Proceed with unit schedule Follow the [facility] Inpatient Flowsheet.   

## 2020-03-06 NOTE — Progress Notes (Signed)
   03/06/20 1600  Psych Admission Type (Psych Patients Only)  Admission Status Voluntary  Psychosocial Assessment  Patient Complaints Depression  Eye Contact Brief  Facial Expression Sad  Affect Sad;Depressed  Speech Logical/coherent;Soft  Interaction Minimal  Motor Activity Slow  Appearance/Hygiene In scrubs  Behavior Characteristics Cooperative;Appropriate to situation  Mood Sad  Thought Process  Coherency WDL  Content WDL  Delusions None reported or observed  Perception WDL  Hallucination None reported or observed  Judgment Impaired  Confusion None  Danger to Self  Current suicidal ideation? Passive  Self-Injurious Behavior No self-injurious ideation or behavior indicators observed or expressed   Agreement Not to Harm Self Yes  Description of Agreement Verbal contract  Danger to Others  Danger to Others None reported or observed

## 2020-03-06 NOTE — [facility] Group Notes (Signed)
[facility] Group Notes: (Clinical Social Work)   03/06/2020      Type of Therapy:  Group Therapy   Participation Level:  Did Not Attend - was invited both individually by MHT and by overhead announcement, chose not to attend.   Ambrose Mantle, LCSW 03/06/2020, 12:20 PM

## 2020-03-07 NOTE — Progress Notes (Signed)
[facility] Group Notes:  (Nursing/MHT/Case Management/Adjunct)  Date:  03/07/2020  Time:  2015  Type of Therapy:  wrap up group  Participation Level:  Active  Participation Quality:  Appropriate, Attentive, Sharing and Supportive  Affect:  Flat  Cognitive:  Alert  Insight:  Improving  Engagement in Group:  Engaged  Modes of Intervention:  Clarification, Education and Support  Summary of Progress/Problems: Positive thinking and positive change were discussed.   Marcille Buffy 03/07/2020, 10:41 PM

## 2020-03-07 NOTE — [facility] Group Notes (Signed)
[facility] LCSW Group Therapy Note  03/07/2020    Type of Therapy and Topic:  Group Therapy:  A Hero Worthy of Support  Participation Level:  Active   Description of Group:  Patients in this group were introduced to the concept that additional supports including self-support are an essential part of recovery.  Matching needs with supports to help fulfill those needs was explained.  Establishing boundaries that can gradually be increased or decreased was described, with patients giving their own examples of establishing appropriate boundaries in their lives.  A song entitled "My Own Hero" was played and a group discussion e  Therapeutic Goals: 1)  demonstrate the importance of being a key part of one's own support system 2)  discuss various available supports 3)  encourage patient to use music as part of their self-support and focus on goals 4)  elicit ideas from patients about supports that need to be added   Summary of Patient Progress:  The patient expressed that his little brother is a healthy support for him, as he can talk to his brother about his depression.  He feels that his mother, sister, and old brother put him down and are therefore not healthy for him.  He demonstrated insight through his contributions to the group discussion.  Therapeutic Modalities:   Motivational Interviewing Activity  Lynnell Chad

## 2020-03-07 NOTE — [facility] Group Notes (Signed)
Adult Psychoeducational Group Not Date:  03/07/2020 Time:  0900-1045 Group Topic/Focus: PROGRESSIVE RELAXATION. A group where deep breathing is taught and tensing and relaxation muscle groups is used. Imagery is used as well.  Pts are asked to imagine 3 pillars that hold them up when they are not able to hold themselves up.  Participation Level:  Active  Participation Quality:  Appropriate  Affect:  Appropriate  Cognitive:  Oriented  Insight: Improving  Engagement in Group:  Engaged  Modes of Intervention:  Activity, Discussion, Education, and Support  Additional Comments:  Pt's energy leve is 8/10. States the one thing that truly holds him up  "is my Surveyor, minerals.  She made me feel like I was someone,,,important...wanted"  Dione Housekeeper 03/07/2020`

## 2020-03-07 NOTE — Progress Notes (Signed)
   03/07/20 1600  Psych Admission Type (Psych Patients Only)  Admission Status Voluntary  Psychosocial Assessment  Patient Complaints Depression  Eye Contact Brief  Facial Expression Sad  Affect Sad;Depressed  Speech Logical/coherent;Soft  Interaction Minimal  Motor Activity Slow  Appearance/Hygiene In scrubs  Behavior Characteristics Cooperative;Appropriate to situation  Mood Depressed  Thought Process  Coherency WDL  Content WDL  Delusions None reported or observed  Perception WDL  Hallucination None reported or observed  Judgment Impaired  Confusion None  Danger to Self  Current suicidal ideation? Passive  Self-Injurious Behavior No self-injurious ideation or behavior indicators observed or expressed   Agreement Not to Harm Self Yes  Description of Agreement Verbal contract  Danger to Others  Danger to Others None reported or observed

## 2020-03-07 NOTE — Progress Notes (Signed)
[place] NOVEL CORONAVIRUS (COVID-19) DAILY CHECK-OFF SYMPTOMS - answer yes or no to each - every day NO YES  Have you had a fever in the past 24 hours?  . Fever (Temp > 37.80C / 100F) X   Have you had any of these symptoms in the past 24 hours? . New Cough .  Sore Throat  .  Shortness of Breath .  Difficulty Breathing .  Unexplained Body Aches   X   Have you had any one of these symptoms in the past 24 hours not related to allergies?   . Runny Nose .  Nasal Congestion .  Sneezing   X   If you have had runny nose, nasal congestion, sneezing in the past 24 hours, has it worsened?  X   EXPOSURES - check yes or no X   Have you traveled outside the state in the past 14 days?  X   Have you been in contact with someone with a confirmed diagnosis of COVID-19 or PUI in the past 14 days without wearing appropriate PPE?  X   Have you been living in the same home as a person with confirmed diagnosis of COVID-19 or a PUI (household contact)?    X   Have you been diagnosed with COVID-19?    X              What to do next: Answered NO to all: Answered YES to anything:   Proceed with unit schedule Follow the [facility] Inpatient Flowsheet.   

## 2020-03-07 NOTE — Progress Notes (Addendum)
[facility] MD Progress Note  03/07/2020 11:39 AM [name]  MRN:  [id number]    HPI:  [name] is a 59 y.o. male who presented voluntarily with suicidal ideations with a plan to walk in front of the car.  Patient was also requesting detoxification from alcohol and crack cocaine. Patient states his drug/alcohol use, and his estrangement from family triggered SI. Patient denies HI/AVH. Patient receives outpatient therapy at the [facility] and states he is not currently taking any medications. Patient states he drinks 6-7 beers per day and uses $40-$50 worth of cocaine on a daily basis. Patient states he went to rehab many years ago. Patient presented to [facility] ED in July 2021 with a similar presentation and was ultimately hospitalized at [facility] afterwards. Patient denies trauma history or criminal charges.   Patient is seen, chart is reviewed.  Patient has been isolating in his room and not attending groups.  Subjective: "I am still feeling suicidal, my plan is to run into traffic" .  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  He will try again tomorrow.  He reports that his main issues is homelessness and he cannot afford a place to live now.  He was living in a motel and runs out of money to pay for his rent in the motel..   Objective:  Patient has resources to contact the [name] and is encouraged to do so. However, [name] may not get help from [name] if he is still using Cocaine and Marijuana. He is not leaving his room except for medications and meals.  He barely participate in group activities but is compliant with his medications.  This morning his hr was 133 and BP was normal.  Patient is advised to increase oral fluid intake and we will repeat V/S this evening.   Writer encouraged patient to join peers in group activities.  We will continue to monitor patient.  Principal Problem: Bipolar disorder, current episode depressed,  severe, without psychotic features (HCC) Diagnosis: Principal Problem:   Bipolar disorder, current episode depressed, severe, without psychotic features (HCC) Active Problems:   Polysubstance abuse (HCC)   Vitamin D deficiency   Medically noncompliant   Abdominal pain   GERD (gastroesophageal reflux disease)   Back pain, chronic   Homeless   Essential hypertension   Stimulant-induced mood disorder (HCC)   Cocaine abuse (HCC)   Hypokalemia   Pre-diabetes   Elevated blood sugar  Total Time spent with patient: 15 minutes  Past Psychiatric History: See H&P Past Medical History:  Past Medical History:  Diagnosis Date  . Alcohol abuse   . Anxiety   . Arthritis    "all over" (11/25/2015)  . Bipolar 1 disorder (HCC)   . Bipolar affective disorder (HCC)   . Chronic back pain   . Chronic kidney disease   . Cocaine abuse (HCC)    smokes crack-cocaine  . Depression   . GERD (gastroesophageal reflux disease)   . Gout   . Headache    "q couple days; stress, tension, anger" (11/25/2015)  . Hemorrhoids   . Hepatitis C    "not yet treated" (11/25/2015)  . Hypertension   . Insomnia   . Myocardial infarction [facility]) 11/2015   "supposedly" (11/25/2015)  . OSA on CPAP    "had mask; it was stolen" (11/25/2015)  . PTSD (post-traumatic stress disorder)   . Tobacco abuse   .  Vitamin D deficiency     Past Surgical History:  Procedure Laterality Date  . CARDIAC CATHETERIZATION N/A 11/29/2015   Procedure: Left Heart Cath and Coronary Angiography;  Surgeon: Laurey Morale, MD;  Location: [facility] INVASIVE CV LAB;  Service: Cardiovascular;  Laterality: N/A;  . FRACTURE SURGERY    . PATELLA FRACTURE SURGERY Left ~ 1995  . TONSILLECTOMY     Family History:  Family History  Problem Relation Age of Onset  . Hypertension Brother   . Diabetes Brother   . Hypertension Mother   . Diabetes Mother   . Arthritis Mother   . Other Sister        bone disease   Family Psychiatric  History: See  H&P Social History:  Social History   Substance and Sexual Activity  Alcohol Use Yes     Social History   Substance and Sexual Activity  Drug Use Yes  . Types: Marijuana, "Crack" cocaine, Cocaine    Social History   Socioeconomic History  . Marital status: Single    Spouse name: Not on file  . Number of children: Not on file  . Years of education: Not on file  . Highest education level: Not on file  Occupational History  . Not on file  Tobacco Use  . Smoking status: Current Every Day Smoker    Packs/day: 0.25    Years: 46.00    Pack years: 11.50    Types: Cigarettes  . Smokeless tobacco: Never Used  Vaping Use  . Vaping Use: Never used  Substance and Sexual Activity  . Alcohol use: Yes  . Drug use: Yes    Types: Marijuana, "Crack" cocaine, Cocaine  . Sexual activity: Not Currently  Other Topics Concern  . Not on file  Social History Narrative   ** Merged History Encounter **       Lives alone in [place], [place]   Social Determinants of Health   Financial Resource Strain:   . Difficulty of Paying Living Expenses: Not on file  Food Insecurity:   . Worried About Programme researcher, broadcasting/film/video in the Last Year: Not on file  . Ran Out of Food in the Last Year: Not on file  Transportation Needs:   . Lack of Transportation (Medical): Not on file  . Lack of Transportation (Non-Medical): Not on file  Physical Activity:   . Days of Exercise per Week: Not on file  . Minutes of Exercise per Session: Not on file  Stress:   . Feeling of Stress : Not on file  Social Connections:   . Frequency of Communication with Friends and Family: Not on file  . Frequency of Social Gatherings with Friends and Family: Not on file  . Attends Religious Services: Not on file  . Active Member of Clubs or Organizations: Not on file  . Attends Banker Meetings: Not on file  . Marital Status: Not on file   Additional Social History:                   Currently homeless       Sleep: Good  Appetite:  Good  Current Medications: Current Facility-Administered Medications  Medication Dose Route Frequency Provider Last Rate Last Admin  . acetaminophen (TYLENOL) tablet 650 mg  650 mg Oral Q6H PRN Antonieta Pert, MD      . alum & mag hydroxide-simeth (MAALOX/MYLANTA) 200-200-20 MG/5ML suspension 30 mL  30 mL Oral Q4H PRN Antonieta Pert, MD      .  amLODipine (NORVASC) tablet 10 mg  10 mg Oral Daily Antonieta Pert, MD   10 mg at 03/07/20 [phone]  . aspirin EC tablet 81 mg  81 mg Oral Daily Antonieta Pert, MD   81 mg at 03/07/20 5573  . cloNIDine (CATAPRES) tablet 0.1 mg  0.1 mg Oral TID PRN Antonieta Pert, MD   0.1 mg at 02/29/20 0919  . cloNIDine (CATAPRES) tablet 0.1 mg  0.1 mg Oral BID Antonieta Pert, MD   0.1 mg at 03/07/20 0831  . clopidogrel (PLAVIX) tablet 75 mg  75 mg Oral Daily Antonieta Pert, MD   75 mg at 03/07/20 2202  . escitalopram (LEXAPRO) tablet 20 mg  20 mg Oral Daily Cristofano, Worthy Rancher, MD   20 mg at 03/07/20 0832  . folic acid (FOLVITE) tablet 1 mg  1 mg Oral Daily Antonieta Pert, MD   1 mg at 03/07/20 5427  . gabapentin (NEURONTIN) capsule 300 mg  300 mg Oral TID Antonieta Pert, MD   300 mg at 03/07/20 0831  . hydrOXYzine (ATARAX/VISTARIL) tablet 25 mg  25 mg Oral TID PRN Antonieta Pert, MD   25 mg at 03/06/20 2123  . lisinopril (ZESTRIL) tablet 40 mg  40 mg Oral Daily Antonieta Pert, MD   40 mg at 03/07/20 0623  . magnesium hydroxide (MILK OF MAGNESIA) suspension 30 mL  30 mL Oral Daily PRN Antonieta Pert, MD      . OLANZapine [facility]) tablet 7.5 mg  7.5 mg Oral QHS Cristofano, Worthy Rancher, MD   7.5 mg at 03/06/20 2122  . pantoprazole (PROTONIX) EC tablet 40 mg  40 mg Oral Daily Antonieta Pert, MD   40 mg at 03/07/20 7628  . pravastatin (PRAVACHOL) tablet 20 mg  20 mg Oral Daily Antonieta Pert, MD   20 mg at 03/07/20 0834  . thiamine tablet 100 mg  100 mg Oral Daily Antonieta Pert, MD   100 mg at  03/07/20 3151  . traZODone (DESYREL) tablet 50 mg  50 mg Oral QHS PRN Antonieta Pert, MD   50 mg at 03/06/20 2122  . Vitamin D3 (Vitamin D) tablet 1,000 Units  1,000 Units Oral Daily Mariel Craft, MD   1,000 Units at 03/07/20 [phone]    Lab Results:  Results for orders placed or performed during the hospital encounter of 02/27/20 (from the past 48 hour(s))  Basic metabolic panel     Status: Abnormal   Collection Time: 03/06/20  5:37 PM  Result Value Ref Range   Sodium 136 135 - 145 mmol/L   Potassium 4.6 3.5 - 5.1 mmol/L   Chloride 104 98 - 111 mmol/L   CO2 22 22 - 32 mmol/L   Glucose, Bld 128 (H) 70 - 99 mg/dL    Comment: Glucose reference range applies only to samples taken after fasting for at least 8 hours.   BUN 17 6 - 20 mg/dL   Creatinine, Ser 0.73 0.61 - 1.24 mg/dL   Calcium 9.6 8.9 - 71.0 mg/dL   GFR, Estimated >62 >69 mL/min    Comment: (NOTE) Calculated using the CKD-EPI Creatinine Equation (2021)    Anion gap 10 5 - 15    Comment: Performed at [facility], [address], [place], [place] [address]    Blood Alcohol level:  Lab Results  Component Value Date   ETH <10 02/26/2020   ETH <10 02/20/2020  Metabolic Disorder Labs: Lab Results  Component Value Date   HGBA1C 5.8 (H) 02/29/2020   MPG 119.76 02/29/2020   MPG 120 11/25/2015   No results found for: PROLACTIN Lab Results  Component Value Date   CHOL 154 02/29/2020   TRIG 121 02/29/2020   HDL 50 02/29/2020   CHOLHDL 3.1 02/29/2020   VLDL 24 02/29/2020   LDLCALC 80 02/29/2020   LDLCALC 80 11/26/2015    Physical Findings: AIMS: Facial and Oral Movements Muscles of Facial Expression: None, normal Lips and Perioral Area: None, normal Jaw: None, normal Tongue: None, normal,Extremity Movements Upper (arms, wrists, hands, fingers): None, normal Lower (legs, knees, ankles, toes): None, normal, Trunk Movements Neck, shoulders, hips: None, normal, Overall Severity Severity of  abnormal movements (highest score from questions above): None, normal Incapacitation due to abnormal movements: None, normal Patient's awareness of abnormal movements (rate only patient's report): No Awareness, Dental Status Current problems with teeth and/or dentures?: No Does patient usually wear dentures?: No  CIWA:  CIWA-Ar Total: 2 COWS:     Musculoskeletal: Strength & Muscle Tone: within normal limits Gait & Station: normal Patient leans: Front and N/A  Psychiatric Specialty Exam: Physical Exam Constitutional:      Appearance: Normal appearance.  HENT:     Head: Normocephalic and atraumatic.  Eyes:     Extraocular Movements: Extraocular movements intact.  Pulmonary:     Effort: Pulmonary effort is normal.  Musculoskeletal:        General: Normal range of motion.     Cervical back: Normal range of motion.  Neurological:     Mental Status: He is alert.     Review of Systems  Eyes: Negative for visual disturbance.  Respiratory: Negative.   Cardiovascular: Negative.  Negative for chest pain.  Gastrointestinal: Negative.  Negative for abdominal distention.  Neurological: Negative.     Blood pressure 128/84, pulse (!) 133, temperature 97.6 F (36.4 C), temperature source Oral, resp. rate 18, height 5\' 7"  (1.702 m), weight 78 kg, SpO2 99 %.Body mass index is 26.94 kg/m.  General Appearance: Casual  Eye Contact:  Poor  Speech:  Garbled and Normal Rate  Volume:  Decreased  Mood:  Depressed  Affect:  Depressed  Thought Process:  Coherent  Orientation:  Full (Time, Place, and Person)  Thought Content:  Logical   Suicidal Thoughts:  Yes.  with intent/plan  Homicidal Thoughts:  No  Memory:  Immediate;   Fair Recent;   Fair Remote;   Fair  Judgement:  Poor  Insight:  Lacking  Psychomotor Activity:  Decreased  Concentration:  Concentration: Fair and Attention Span: Fair  Recall:  [employer] of Knowledge:  Good  Language:  Good  Akathisia:  No  Handed:  Right   AIMS (if indicated):     Assets:  Leisure Time Physical Health Resilience  ADL's:  Intact  Cognition:  WNL  Sleep:  Number of Hours: 6     Treatment Plan Summary: Daily contact with patient to assess and evaluate symptoms and progress in treatment Mr. Bredehoft continues to endorse SI w/ plan to run into traffic. Per RN patient does leave his room and attends some groups but he seldom interacts with peers.  Patient was sen in his room while group activities were on.  He reports feeling depressed and his affect is depressed with limited eye contact with provider.  He is advised to keep trying to contact the [employer] for assistance..  Bipolar affective disorder,  depressed, severe without psychotic features:   - Zyprexa   7.5 mg at bedtime - Lexapro to 20 mg daily, patient does not show signs of mania, has been on 20mg  2 days and was on 5mg  3 days before while on inpatient    Stimulant use d/o: -Continue gabapentin 300 mg TID  Anxiety: -Continue hydroxyzine 25 mg TID PRN  Insomnia: -Continue Trazodone 50 mg daily PRN  Chronic Medical Illness Medications -clonidine 0.1mg  BID -Plavix 75mg  daily -Amlodipine 10mg  daily, HTN - ASA 81 mg -Protonix 40mg  daily, GERD - Pravastatin 20mg  daily, Hyperlipidemia - Thiamine 100mg , Hx substance use disorder, EtoH  -Continue  vitamin D 1000 IUs daily - recheck BMP 03/06/2020 evening draw.  Other PRN's -Tylenol 650mg  q6h, Pain -Maalox 28ml q4h, Indigestion -Milk of Mag 3mL, constipation  , NP  PMHNP-BC 03/07/2020, 11:39 AM

## 2020-03-08 DIAGNOSIS — F314 Bipolar disorder, current episode depressed, severe, without psychotic features: Secondary | ICD-10-CM | POA: Diagnosis not present

## 2020-03-08 NOTE — Progress Notes (Signed)
DID NOT ATTEND    Spiritual care group on grief and loss facilitated by chaplain Burnis Kingfisher  Group Goal:  Support / Education around grief and loss  Members engage in facilitated group support and psycho-social education.  Group Description:  Following introductions and group rules, group members engaged in facilitated group  support around topic of loss, with particular support around experiences of loss in their lives. Group Identified types of loss (relationships / self / things) and identified patterns, circumstances, and changes that precipitate losses. Reflected on thoughts / feelings around loss, normalized grief responses, and recognized variety in grief experience.  Group engaged in reflection on [employer] of Grief and engaged in process-oriented group support.    Group facilitation drew from Narrative and Client Centered / Adlerian frameworks  Patient Progress: Pt was in room when Chaplain invited to group.  He voiced understanding that group was occurring.  Did not attend

## 2020-03-08 NOTE — [facility] Group Notes (Signed)
[facility] LCSW Group Therapy  03/08/2020 2:10 PM  Type of Therapy:  Group Therapy  Participation Level:  Did Not Attend  Modes of Intervention:  Discussion, Education, Socialization and Support  Summary of Progress/Problems: TedTalk titled How To Turn Our Weaknesses into Strengths was shown. Packet discussing strengths explorations in different aspects of your lives, positive traits, and exploring strengths and qualities was given. Discussion was had about strengths and how our perceptions can impact out if different traits are strengths even if we think they are weaknesses. Discussion was also had on how we can turn out weaknesses into strengths. Also, discussion was had on how individuals can reflect on their strengths packets when they are having a bad day for encouragement. Pt did not attend group  Felizardo Hoffmann 03/08/2020, 2:10 PM

## 2020-03-08 NOTE — Progress Notes (Signed)
D:  Patient's self inventory sheet, patient has poor sleep, no sleep medication.  Fair appetite, low energy level, poor concentration.  Rated depression, hopeless and anxiety #9.  Withdrawals, cravings, runny nose.  SI marked on self inventory sheet, contracts for safety.  Physical problems, lightheaded, pain, headaches.  Physical pain, worst pain #9.  Goal is a place to stay.  Plans to get on the phone.  No discharge plans. A:  Medications administered per MD orders.  Emotional support and encouragement given patient. R:  Denied SI and HI while talking to nurse this afternoon, contracts for safety.  Denied A/V hallucinations.  Safety maintained with 15 minute checks.

## 2020-03-08 NOTE — Progress Notes (Signed)
   03/08/20 0010  COVID-19 Daily Checkoff  Have you had a fever (temp > 37.80C/100F)  in the past 24 hours?  No  If you have had runny nose, nasal congestion, sneezing in the past 24 hours, has it worsened? No  COVID-19 EXPOSURE  Have you traveled outside the state in the past 14 days? No  Have you been in contact with someone with a confirmed diagnosis of COVID-19 or PUI in the past 14 days without wearing appropriate PPE? No  Have you been living in the same home as a person with confirmed diagnosis of COVID-19 or a PUI (household contact)? No  Have you been diagnosed with COVID-19? No

## 2020-03-08 NOTE — Progress Notes (Signed)
[facility] MD Progress Note  03/08/2020 2:48 PM ZETHAN ALFIERI  MRN:  [id number]    HPI:  Patient seen chart reviewed and case discussed with treatment team. Patient states that his mood is still low, but endorses some improvement. He was told to make sure he call the [name] today which he said that he would. Patient was seen attending groups and making his needs known. Denies adverse effects of the medication.   Patient did not verbalize SI today which he had previously done in response to discharge date being set. It appears that patient is more willing to take part in his discharge planning today, will allow him time to do so.    Principal Problem: Bipolar disorder, current episode depressed, severe, without psychotic features (HCC) Diagnosis: Principal Problem:   Bipolar disorder, current episode depressed, severe, without psychotic features (HCC) Active Problems:   Polysubstance abuse (HCC)   Vitamin D deficiency   Medically noncompliant   Abdominal pain   GERD (gastroesophageal reflux disease)   Back pain, chronic   Homeless   Essential hypertension   Stimulant-induced mood disorder (HCC)   Cocaine abuse (HCC)   Hypokalemia   Pre-diabetes   Elevated blood sugar  Total Time spent with patient: 15 minutes  Past Psychiatric History: See H&P Past Medical History:  Past Medical History:  Diagnosis Date  . Alcohol abuse   . Anxiety   . Arthritis    "all over" (11/25/2015)  . Bipolar 1 disorder (HCC)   . Bipolar affective disorder (HCC)   . Chronic back pain   . Chronic kidney disease   . Cocaine abuse (HCC)    smokes crack-cocaine  . Depression   . GERD (gastroesophageal reflux disease)   . Gout   . Headache    "q couple days; stress, tension, anger" (11/25/2015)  . Hemorrhoids   . Hepatitis C    "not yet treated" (11/25/2015)  . Hypertension   . Insomnia   . Myocardial infarction [facility]) 11/2015   "supposedly" (11/25/2015)  . OSA on CPAP    "had mask; it was stolen"  (11/25/2015)  . PTSD (post-traumatic stress disorder)   . Tobacco abuse   . Vitamin D deficiency     Past Surgical History:  Procedure Laterality Date  . CARDIAC CATHETERIZATION N/A 11/29/2015   Procedure: Left Heart Cath and Coronary Angiography;  Surgeon: Laurey Morale, MD;  Location: [facility] INVASIVE CV LAB;  Service: Cardiovascular;  Laterality: N/A;  . FRACTURE SURGERY    . PATELLA FRACTURE SURGERY Left ~ 1995  . TONSILLECTOMY     Family History:  Family History  Problem Relation Age of Onset  . Hypertension Brother   . Diabetes Brother   . Hypertension Mother   . Diabetes Mother   . Arthritis Mother   . Other Sister        bone disease   Family Psychiatric  History: See H&P Social History:  Social History   Substance and Sexual Activity  Alcohol Use Yes     Social History   Substance and Sexual Activity  Drug Use Yes  . Types: Marijuana, "Crack" cocaine, Cocaine    Social History   Socioeconomic History  . Marital status: Single    Spouse name: Not on file  . Number of children: Not on file  . Years of education: Not on file  . Highest education level: Not on file  Occupational History  . Not on file  Tobacco Use  . Smoking status: Current  Every Day Smoker    Packs/day: 0.25    Years: 46.00    Pack years: 11.50    Types: Cigarettes  . Smokeless tobacco: Never Used  Vaping Use  . Vaping Use: Never used  Substance and Sexual Activity  . Alcohol use: Yes  . Drug use: Yes    Types: Marijuana, "Crack" cocaine, Cocaine  . Sexual activity: Not Currently  Other Topics Concern  . Not on file  Social History Narrative   ** Merged History Encounter **       Lives alone in [place], [place]   Social Determinants of Health   Financial Resource Strain:   . Difficulty of Paying Living Expenses: Not on file  Food Insecurity:   . Worried About Programme researcher, broadcasting/film/video in the Last Year: Not on file  . Ran Out of Food in the Last Year: Not on file  Transportation  Needs:   . Lack of Transportation (Medical): Not on file  . Lack of Transportation (Non-Medical): Not on file  Physical Activity:   . Days of Exercise per Week: Not on file  . Minutes of Exercise per Session: Not on file  Stress:   . Feeling of Stress : Not on file  Social Connections:   . Frequency of Communication with Friends and Family: Not on file  . Frequency of Social Gatherings with Friends and Family: Not on file  . Attends Religious Services: Not on file  . Active Member of Clubs or Organizations: Not on file  . Attends Banker Meetings: Not on file  . Marital Status: Not on file   Additional Social History:                   Currently homeless      Sleep: Good  Appetite:  Good  Current Medications: Current Facility-Administered Medications  Medication Dose Route Frequency Provider Last Rate Last Admin  . acetaminophen (TYLENOL) tablet 650 mg  650 mg Oral Q6H PRN Antonieta Pert, MD   650 mg at 03/08/20 0738  . alum & mag hydroxide-simeth (MAALOX/MYLANTA) 200-200-20 MG/5ML suspension 30 mL  30 mL Oral Q4H PRN Antonieta Pert, MD      . amLODipine (NORVASC) tablet 10 mg  10 mg Oral Daily Antonieta Pert, MD   10 mg at 03/08/20 0733  . aspirin EC tablet 81 mg  81 mg Oral Daily Antonieta Pert, MD   81 mg at 03/08/20 0734  . cloNIDine (CATAPRES) tablet 0.1 mg  0.1 mg Oral TID PRN Antonieta Pert, MD   0.1 mg at 02/29/20 0919  . cloNIDine (CATAPRES) tablet 0.1 mg  0.1 mg Oral BID Antonieta Pert, MD   0.1 mg at 03/08/20 0734  . clopidogrel (PLAVIX) tablet 75 mg  75 mg Oral Daily Antonieta Pert, MD   75 mg at 03/08/20 0735  . escitalopram (LEXAPRO) tablet 20 mg  20 mg Oral Daily Eleisha Branscomb, Worthy Rancher, MD   20 mg at 03/08/20 0735  . folic acid (FOLVITE) tablet 1 mg  1 mg Oral Daily Antonieta Pert, MD   1 mg at 03/08/20 0736  . gabapentin (NEURONTIN) capsule 300 mg  300 mg Oral TID Antonieta Pert, MD   300 mg at 03/08/20 1246  .  hydrOXYzine (ATARAX/VISTARIL) tablet 25 mg  25 mg Oral TID PRN Antonieta Pert, MD   25 mg at 03/07/20 2103  . lisinopril (ZESTRIL) tablet 40 mg  40 mg  Oral Daily Antonieta Pert, MD   40 mg at 03/08/20 0736  . magnesium hydroxide (MILK OF MAGNESIA) suspension 30 mL  30 mL Oral Daily PRN Antonieta Pert, MD      . OLANZapine [facility]) tablet 7.5 mg  7.5 mg Oral QHS Zamyia Gowell, Worthy Rancher, MD   7.5 mg at 03/07/20 2103  . pantoprazole (PROTONIX) EC tablet 40 mg  40 mg Oral Daily Antonieta Pert, MD   40 mg at 03/08/20 0737  . pravastatin (PRAVACHOL) tablet 20 mg  20 mg Oral Daily Antonieta Pert, MD   20 mg at 03/08/20 0737  . thiamine tablet 100 mg  100 mg Oral Daily Antonieta Pert, MD   100 mg at 03/08/20 9833  . traZODone (DESYREL) tablet 50 mg  50 mg Oral QHS PRN Antonieta Pert, MD   50 mg at 03/07/20 2103  . Vitamin D3 (Vitamin D) tablet 1,000 Units  1,000 Units Oral Daily Mariel Craft, MD   1,000 Units at 03/08/20 [phone]    Lab Results:  Results for orders placed or performed during the hospital encounter of 02/27/20 (from the past 48 hour(s))  Basic metabolic panel     Status: Abnormal   Collection Time: 03/06/20  5:37 PM  Result Value Ref Range   Sodium 136 135 - 145 mmol/L   Potassium 4.6 3.5 - 5.1 mmol/L   Chloride 104 98 - 111 mmol/L   CO2 22 22 - 32 mmol/L   Glucose, Bld 128 (H) 70 - 99 mg/dL    Comment: Glucose reference range applies only to samples taken after fasting for at least 8 hours.   BUN 17 6 - 20 mg/dL   Creatinine, Ser 5.39 0.61 - 1.24 mg/dL   Calcium 9.6 8.9 - 76.7 mg/dL   GFR, Estimated >34 >19 mL/min    Comment: (NOTE) Calculated using the CKD-EPI Creatinine Equation (2021)    Anion gap 10 5 - 15    Comment: Performed at [facility], [address], [place], [place] [address]    Blood Alcohol level:  Lab Results  Component Value Date   ETH <10 02/26/2020   ETH <10 02/20/2020    Metabolic Disorder Labs: Lab  Results  Component Value Date   HGBA1C 5.8 (H) 02/29/2020   MPG 119.76 02/29/2020   MPG 120 11/25/2015   No results found for: PROLACTIN Lab Results  Component Value Date   CHOL 154 02/29/2020   TRIG 121 02/29/2020   HDL 50 02/29/2020   CHOLHDL 3.1 02/29/2020   VLDL 24 02/29/2020   LDLCALC 80 02/29/2020   LDLCALC 80 11/26/2015    Physical Findings: AIMS: Facial and Oral Movements Muscles of Facial Expression: None, normal Lips and Perioral Area: None, normal Jaw: None, normal Tongue: None, normal,Extremity Movements Upper (arms, wrists, hands, fingers): None, normal Lower (legs, knees, ankles, toes): None, normal, Trunk Movements Neck, shoulders, hips: None, normal, Overall Severity Severity of abnormal movements (highest score from questions above): None, normal Incapacitation due to abnormal movements: None, normal Patient's awareness of abnormal movements (rate only patient's report): No Awareness, Dental Status Current problems with teeth and/or dentures?: No Does patient usually wear dentures?: No  CIWA:  CIWA-Ar Total: 2 COWS:     Musculoskeletal: Strength & Muscle Tone: within normal limits Gait & Station: normal Patient leans: Front and N/A  Psychiatric Specialty Exam: Physical Exam Constitutional:      Appearance: Normal appearance.  HENT:  Head: Normocephalic and atraumatic.  Eyes:     Extraocular Movements: Extraocular movements intact.  Pulmonary:     Effort: Pulmonary effort is normal.  Musculoskeletal:        General: Normal range of motion.     Cervical back: Normal range of motion.  Neurological:     Mental Status: He is alert.     Review of Systems  Eyes: Negative for visual disturbance.  Respiratory: Negative.   Cardiovascular: Negative.  Negative for chest pain.  Gastrointestinal: Negative.  Negative for abdominal distention.  Neurological: Negative.     Blood pressure 118/90, pulse (!) 108, temperature 98 F (36.7 C), temperature  source Oral, resp. rate 20, height  (1.702 m), weight 78 kg, SpO2 99 %.Body mass index is 26.94 kg/m.  General Appearance: Casual  Eye Contact:  Poor  Speech:  Garbled and Normal Rate  Volume:  Decreased  Mood:  Depressed  Affect:  Depressed  Thought Process:  Coherent  Orientation:  Full (Time, Place, and Person)  Thought Content:  Logical   Suicidal Thoughts:  No  Homicidal Thoughts:  No  Memory:  Immediate;   Fair Recent;   Fair Remote;   Fair  Judgement:  Poor  Insight:  Lacking  Psychomotor Activity:  Decreased  Concentration:  Concentration: Fair and Attention Span: Fair  Recall:  Fair  Fund of Knowledge:  Good  Language:  Good  Akathisia:  No  Handed:  Right  AIMS (if indicated):     Assets:  Leisure Time Physical Health Resilience  ADL's:  Intact  Cognition:  WNL  Sleep:  Number of Hours: 5.5     Treatment Plan Summary: Daily contact with patient to assess and evaluate symptoms and progress in treatment   Bipolar affective disorder, depressed, severe without psychotic features:   - Zyprexa   7.5 mg at bedtime - Lexapro to 20 mg daily, patient does not show signs of mania, has been on  2 days and was on  3 days before while on inpatient    Stimulant use d/o: -Continue gabapentin 300 mg TID  Anxiety: -Continue hydroxyzine 25 mg TID PRN  Insomnia: -Continue Trazodone 50 mg daily PRN  Chronic Medical Illness Medications -clonidine 0.1mg  BID -Plavix  daily -Amlodipine  daily, HTN - ASA 81 mg -Protonix  daily, GERD - Pravastatin  daily, Hyperlipidemia - Thiamine , Hx substance use disorder, EtoH  -Continue  vitamin D 1000 IUs daily   Other PRN's -Tylenol  q6h, Pain -Maalox 30ml q4h, Indigestion -Milk of Mag 30mL, constipation  Clement Sayres, MD  PMHNP-BC 03/08/2020, 2:48 PM    Patient ID: [name], male   DOB: 24-Jul-1960, 59 y.o.   MRN: [id number]

## 2020-03-08 NOTE — [facility] Group Notes (Signed)
Occupational Therapy Group Note Date: 03/08/2020 Group Topic/Focus: Safety Planning  Group Description: Group encouraged increased engagement and participation through discussion focused on safety planning. Patients were provided with a reading focused on our "chapters of life" and read it out loud and then engaged in discussion on what the reading meant to them/what they got from it. Patients then completed a worksheet and identified red vs green flags of their mental health, looking at warning signs or signs that things are not going well vs helpful guidances. Discussion followed with patients sharing their red vs green flags and strategies to overcome identified red flags.  Participation Level: Active   Participation Quality: Minimal Cues   Behavior: Calm and Cooperative   Speech/Thought Process: Barely audible and Directed   Affect/Mood: Constricted   Insight: Fair   Judgement: Fair   Individualization: [name] was active in his participation with min verbal cues for participation in discussion. Pt identified "isolation" and "stop caring, stop trying" as his red flag warning signs. Pt shared when he stops caring, he isolates from the people around him and falls into a "dark hole." Pt identified a green flag for him as "surrounding myself with supportive people" and recognized he needs to do this by reaching out more.   Modes of Intervention: Activity, Discussion and Education  Patient Response to Interventions:  Attentive   Plan: Continue to engage patient in OT groups 2 - 3x/week.  03/08/2020  Donne Hazel, MOT, OTR/L

## 2020-03-08 NOTE — Progress Notes (Signed)
Pt stated he was concerned about D/C tomorow due to him being uncertain about where he will be going. Another pt informed him about the Tx center in [place] and pt stated he contacted them today .     03/08/20 2300  Psych Admission Type (Psych Patients Only)  Admission Status Voluntary  Psychosocial Assessment  Patient Complaints Anxiety  Eye Contact Fair  Facial Expression Flat  Affect Appropriate to circumstance  Speech Logical/coherent  Interaction Minimal  Motor Activity Slow  Appearance/Hygiene In scrubs  Behavior Characteristics Anxious  Mood Depressed  Thought Process  Coherency WDL  Content WDL  Delusions None reported or observed  Perception WDL  Hallucination None reported or observed  Judgment Impaired  Confusion None  Danger to Self  Current suicidal ideation? Passive  Self-Injurious Behavior No self-injurious ideation or behavior indicators observed or expressed   Agreement Not to Harm Self Yes  Description of Agreement Verbal contract  Danger to Others  Danger to Others None reported or observed

## 2020-03-08 NOTE — [facility] Group Notes (Signed)
The focus of this group is to help patients establish daily goals to achieve during treatment and discuss how the patient can incorporate goal setting into their daily lives to aide in recovery.  PT did not attend group 

## 2020-03-08 NOTE — Plan of Care (Signed)
Nurse discussed anxiety, depression and coping skills with patient.  

## 2020-03-08 NOTE — Progress Notes (Signed)
Adult Psychoeducational Group Note  Date:  03/08/2020 Time:  8:48 AM  Group Topic/Focus:  Wrap-Up Group:   The focus of this group is to help patients review their daily goal of treatment and discuss progress on daily workbooks.  Participation Level:  Active  Participation Quality:  Appropriate  Affect:  Appropriate  Cognitive:  Appropriate  Insight: Appropriate  Engagement in Group:  Engaged  Modes of Intervention:  Discussion  Additional Comments:  Pt attend wrap up group. His day was a 6 the one positive thing that happen to him today he woke up.  Charna Busman Long 03/08/2020, 8:48 AM

## 2020-03-08 NOTE — Progress Notes (Signed)
   03/08/20 0013  Psych Admission Type (Psych Patients Only)  Admission Status Voluntary  Psychosocial Assessment  Patient Complaints Depression  Eye Contact Fair  Facial Expression Flat  Affect Appropriate to circumstance  Speech Logical/coherent  Interaction Minimal  Motor Activity Slow  Appearance/Hygiene In scrubs  Behavior Characteristics Appropriate to situation  Mood Depressed  Thought Process  Coherency WDL  Content WDL  Delusions None reported or observed  Perception WDL  Hallucination None reported or observed  Judgment Impaired  Confusion None  Danger to Self  Current suicidal ideation? Denies  Self-Injurious Behavior No self-injurious ideation or behavior indicators observed or expressed   Agreement Not to Harm Self Yes  Description of Agreement Verbal contract  Danger to Others  Danger to Others None reported or observed

## 2020-03-08 NOTE — Progress Notes (Signed)
Patient described his day as "not bad" and "not good". He said that he is grateful for having woken up this morning. The patient is concerned about whether or not the social worker has sent in the proper paperwork for his housing.

## 2020-03-09 ENCOUNTER — Other Ambulatory Visit: Payer: Self-pay

## 2020-03-09 ENCOUNTER — Ambulatory Visit: Payer: Medicare Other

## 2020-03-09 ENCOUNTER — Encounter (HOSPITAL_COMMUNITY): Payer: Self-pay | Admitting: Emergency Medicine

## 2020-03-09 ENCOUNTER — Emergency Department (HOSPITAL_COMMUNITY)
Admission: EM | Admit: 2020-03-09 | Discharge: 2020-03-10 | Disposition: A | Discharge status: Home / Self Care | Payer: No Typology Code available for payment source | Attending: Emergency Medicine | Admitting: Emergency Medicine

## 2020-03-09 DIAGNOSIS — K219 Gastro-esophageal reflux disease without esophagitis: Secondary | ICD-10-CM | POA: Insufficient documentation

## 2020-03-09 DIAGNOSIS — E119 Type 2 diabetes mellitus without complications: Secondary | ICD-10-CM | POA: Insufficient documentation

## 2020-03-09 DIAGNOSIS — R1084 Generalized abdominal pain: Secondary | ICD-10-CM | POA: Insufficient documentation

## 2020-03-09 DIAGNOSIS — I129 Hypertensive chronic kidney disease with stage 1 through stage 4 chronic kidney disease, or unspecified chronic kidney disease: Secondary | ICD-10-CM | POA: Insufficient documentation

## 2020-03-09 DIAGNOSIS — F17210 Nicotine dependence, cigarettes, uncomplicated: Secondary | ICD-10-CM | POA: Insufficient documentation

## 2020-03-09 DIAGNOSIS — Z79899 Other long term (current) drug therapy: Secondary | ICD-10-CM | POA: Insufficient documentation

## 2020-03-09 DIAGNOSIS — N189 Chronic kidney disease, unspecified: Secondary | ICD-10-CM | POA: Insufficient documentation

## 2020-03-09 DIAGNOSIS — Z7982 Long term (current) use of aspirin: Secondary | ICD-10-CM | POA: Insufficient documentation

## 2020-03-09 DIAGNOSIS — I2510 Atherosclerotic heart disease of native coronary artery without angina pectoris: Secondary | ICD-10-CM | POA: Insufficient documentation

## 2020-03-09 LAB — URINALYSIS, ROUTINE W REFLEX MICROSCOPIC
Bilirubin Urine: NEGATIVE
Glucose, UA: NEGATIVE mg/dL
Hgb urine dipstick: NEGATIVE
Ketones, ur: NEGATIVE mg/dL
Nitrite: NEGATIVE
Protein, ur: NEGATIVE mg/dL
Specific Gravity, Urine: 1.014 (ref 1.005–1.030)
pH: 6 (ref 5.0–8.0)

## 2020-03-09 LAB — COMPREHENSIVE METABOLIC PANEL
ALT: 29 U/L (ref 0–44)
AST: 23 U/L (ref 15–41)
Albumin: 4.4 g/dL (ref 3.5–5.0)
Alkaline Phosphatase: 57 U/L (ref 38–126)
Anion gap: 13 (ref 5–15)
BUN: 21 mg/dL — ABNORMAL HIGH (ref 6–20)
CO2: 23 mmol/L (ref 22–32)
Calcium: 10 mg/dL (ref 8.9–10.3)
Chloride: 100 mmol/L (ref 98–111)
Creatinine, Ser: 0.99 mg/dL (ref 0.61–1.24)
GFR, Estimated: >60 mL/min (ref >60)
Glucose, Bld: 111 mg/dL — ABNORMAL HIGH (ref 70–99)
Potassium: 5.1 mmol/L (ref 3.5–5.1)
Sodium: 136 mmol/L (ref 135–145)
Total Bilirubin: 0.6 mg/dL (ref 0.3–1.2)
Total Protein: 7.6 g/dL (ref 6.5–8.1)

## 2020-03-09 LAB — LIPASE, BLOOD: Lipase: 37 U/L (ref 11–51)

## 2020-03-09 LAB — CBC
HCT: 47.9 % (ref 39.0–52.0)
Hemoglobin: 15.5 g/dL (ref 13.0–17.0)
MCH: 28.2 pg (ref 26.0–34.0)
MCHC: 32.4 g/dL (ref 30.0–36.0)
MCV: 87.2 fL (ref 80.0–100.0)
Platelets: 307 10*3/uL (ref 150–400)
RBC: 5.49 MIL/uL (ref 4.22–5.81)
RDW: 14 % (ref 11.5–15.5)
WBC: 11.5 10*3/uL — ABNORMAL HIGH (ref 4.0–10.5)
nRBC: 0 % (ref 0.0–0.2)

## 2020-03-09 MED ORDER — PANTOPRAZOLE SODIUM 40 MG PO TBEC: 40.0000 mg | DELAYED_RELEASE_TABLET | Freq: Every day | ORAL | 0 refills | Status: AC

## 2020-03-09 MED ORDER — CLOPIDOGREL BISULFATE 75 MG PO TABS: 75.0000 mg | ORAL_TABLET | Freq: Every day | ORAL | 0 refills | Status: DC

## 2020-03-09 MED ORDER — DICYCLOMINE HCL 10 MG PO CAPS
10.0000 mg | ORAL_CAPSULE | Freq: Once | ORAL | Status: AC
  Administered 2020-03-10: 10 mg via ORAL
  Filled 2020-03-09: qty 1

## 2020-03-09 MED ORDER — ASPIRIN EC 81 MG PO TBEC: 81.0000 mg | DELAYED_RELEASE_TABLET | Freq: Every day | ORAL | 3 refills | Status: DC

## 2020-03-09 MED ORDER — OLANZAPINE 7.5 MG PO TABS: 7.5000 mg | ORAL_TABLET | Freq: Every day | ORAL | 0 refills | Status: AC

## 2020-03-09 MED ORDER — HYDROXYZINE HCL 25 MG PO TABS: 25.0000 mg | ORAL_TABLET | Freq: Three times a day (TID) | ORAL | 0 refills | Status: AC | PRN

## 2020-03-09 MED ORDER — TRAZODONE HCL 50 MG PO TABS: 50.0000 mg | ORAL_TABLET | Freq: Every evening | ORAL | 0 refills | Status: AC | PRN

## 2020-03-09 MED ORDER — AMLODIPINE BESYLATE 10 MG PO TABS: 10.0000 mg | ORAL_TABLET | Freq: Every day | ORAL | 0 refills | Status: AC

## 2020-03-09 MED ORDER — LISINOPRIL 40 MG PO TABS: 40.0000 mg | ORAL_TABLET | Freq: Every day | ORAL | 0 refills | Status: DC

## 2020-03-09 MED ORDER — GABAPENTIN 300 MG PO CAPS: 300.0000 mg | ORAL_CAPSULE | Freq: Three times a day (TID) | ORAL | 0 refills | Status: AC

## 2020-03-09 MED ORDER — ESCITALOPRAM OXALATE 20 MG PO TABS: 20.0000 mg | ORAL_TABLET | Freq: Every day | ORAL | 0 refills | Status: AC

## 2020-03-09 MED ORDER — PRAVASTATIN SODIUM 20 MG PO TABS: 20.0000 mg | ORAL_TABLET | Freq: Every day | ORAL | 0 refills | Status: AC

## 2020-03-09 MED ORDER — PRAVASTATIN SODIUM 20 MG PO TABS: 20.0000 mg | ORAL_TABLET | Freq: Every day | ORAL | 0 refills | Status: DC

## 2020-03-09 MED ORDER — CLOPIDOGREL BISULFATE 75 MG PO TABS: 75.0000 mg | ORAL_TABLET | Freq: Every day | ORAL | 0 refills | Status: AC

## 2020-03-09 MED ORDER — ONDANSETRON 4 MG PO TBDP
4.0000 mg | ORAL_TABLET | Freq: Once | ORAL | Status: AC
  Administered 2020-03-10: 4 mg via ORAL
  Filled 2020-03-09: qty 1

## 2020-03-09 MED ORDER — ASPIRIN EC 81 MG PO TBEC: 81.0000 mg | DELAYED_RELEASE_TABLET | Freq: Every day | ORAL | 3 refills | Status: AC

## 2020-03-09 MED ORDER — AMLODIPINE BESYLATE 10 MG PO TABS: 10.0000 mg | ORAL_TABLET | Freq: Every day | ORAL | 0 refills | Status: DC

## 2020-03-09 MED ORDER — LISINOPRIL 40 MG PO TABS: 40.0000 mg | ORAL_TABLET | Freq: Every day | ORAL | 0 refills | Status: AC

## 2020-03-09 MED ORDER — ALUM & MAG HYDROXIDE-SIMETH 200-200-20 MG/5ML PO SUSP
30.0000 mL | Freq: Once | ORAL | Status: AC
  Administered 2020-03-10: 30 mL via ORAL
  Filled 2020-03-09: qty 30

## 2020-03-09 NOTE — Progress Notes (Signed)
D:  Patient denied SI and HI, contracts for safety.  Denied A/V hallucinations.  Denied pain. A:  Medications administered per MD orders.  Emotional support and encouragement given patient. R:  Safety maintained with 15 minute checks.  

## 2020-03-09 NOTE — Discharge Summary (Signed)
Physician Discharge Summary Note  Patient:  [name] is an 59 y.o., male MRN:  [id number] DOB:  [birth year] Patient phone:  [phone] (home)  Patient address:   [address] [place] [address],  Total Time spent with patient: 30 minutes  Date of Admission:  02/27/2020 Date of Discharge: 03/09/2020  Reason for Admission:  [name] an 59 y.o.malepresenting voluntarily to [facility] ED reporting suicidal ideation with a plan to walk in front of a car. Patient also requesting detox from alcohol and crack cocaine. Patient states his drug/alcohol use, and his estrangement from family triggered SI. Patient denies HI/AVH. Patient receives outpatient therapy at the [facility] and states he is not currently taking any medications. Patient states he drinks 6-7 beers per day and uses $40-$50 worth of cocaine on a daily basis. Patient states he went to rehab many years ago. Patient presented to [facility] ED in July 2021 with a similar presentation and was ultimately hospitalized at [facility] afterwards. Patient denies trauma history or criminal charges. Patient states he does not have natural supports TTS can contact for collateral information.  Today, patient is calm and cooperative. He has been medication compliant while in the hospital and has no complaints of side effects. His blood pressure was initially elevated but has been effectively treated, 128/90, after resuming his home medications. Patient has a long history of alcohol and crack cocaine abuse. He will discharge to [facility] for additional help with his substance abuse issues.  [facility]. He denies suicidal and homicidal ideation, plan or intent. He denies auditory and visual hallucinations. He has been calm and cooperative with no behavioral issues on the unit. He has been attending group therapy. He stated he is sleeping well and his appetite is good. He endorses  low mood and some anxiety about discharge, he is homeless. He will discharge to a local [employer].    Principal Problem: Bipolar disorder, current episode depressed, severe, without psychotic features [facility]) Discharge Diagnoses: Principal Problem:   Bipolar disorder, current episode depressed, severe, without psychotic features (HCC) Active Problems:   Polysubstance abuse (HCC)   Vitamin D deficiency   Medically noncompliant   Abdominal pain   GERD (gastroesophageal reflux disease)   Back pain, chronic   Homeless   Essential hypertension   Stimulant-induced mood disorder (HCC)   Cocaine abuse (HCC)   Hypokalemia   Pre-diabetes   Elevated blood sugar   Past Psychiatric History: See H&P  Past Medical History:  Past Medical History:  Diagnosis Date  . Alcohol abuse   . Anxiety   . Arthritis    "all over" (11/25/2015)  . Bipolar 1 disorder (HCC)   . Bipolar affective disorder (HCC)   . Chronic back pain   . Chronic kidney disease   . Cocaine abuse (HCC)    smokes crack-cocaine  . Depression   . GERD (gastroesophageal reflux disease)   . Gout   . Headache    "q couple days; stress, tension, anger" (11/25/2015)  . Hemorrhoids   . Hepatitis C    "not yet treated" (11/25/2015)  . Hypertension   . Insomnia   . Myocardial infarction [facility]) 11/2015   "supposedly" (11/25/2015)  . OSA on CPAP    "had mask; it was stolen" (11/25/2015)  . PTSD (post-traumatic stress disorder)   . Tobacco abuse   . Vitamin D deficiency     Past Surgical  History:  Procedure Laterality Date  . CARDIAC CATHETERIZATION N/A 11/29/2015   Procedure: Left Heart Cath and Coronary Angiography;  Surgeon: Laurey Morale, MD;  Location: [facility] INVASIVE CV LAB;  Service: Cardiovascular;  Laterality: N/A;  . FRACTURE SURGERY    . PATELLA FRACTURE SURGERY Left ~ 1995  . TONSILLECTOMY     Family History:  Family History  Problem Relation Age of Onset  . Hypertension Brother   . Diabetes Brother   .  Hypertension Mother   . Diabetes Mother   . Arthritis Mother   . Other Sister        bone disease   Family Psychiatric  History: See H&P Social History:  Social History   Substance and Sexual Activity  Alcohol Use Yes     Social History   Substance and Sexual Activity  Drug Use Yes  . Types: Marijuana, "Crack" cocaine, Cocaine    Social History   Socioeconomic History  . Marital status: Single    Spouse name: Not on file  . Number of children: Not on file  . Years of education: Not on file  . Highest education level: Not on file  Occupational History  . Not on file  Tobacco Use  . Smoking status: Current Every Day Smoker    Packs/day: 0.25    Years: 46.00    Pack years: 11.50    Types: Cigarettes  . Smokeless tobacco: Never Used  Vaping Use  . Vaping Use: Never used  Substance and Sexual Activity  . Alcohol use: Yes  . Drug use: Yes    Types: Marijuana, "Crack" cocaine, Cocaine  . Sexual activity: Not Currently  Other Topics Concern  . Not on file  Social History Narrative   ** Merged History Encounter **       Lives alone in [place], [place]   Social Determinants of Health   Financial Resource Strain:   . Difficulty of Paying Living Expenses: Not on file  Food Insecurity:   . Worried About Programme researcher, broadcasting/film/video in the Last Year: Not on file  . Ran Out of Food in the Last Year: Not on file  Transportation Needs:   . Lack of Transportation (Medical): Not on file  . Lack of Transportation (Non-Medical): Not on file  Physical Activity:   . Days of Exercise per Week: Not on file  . Minutes of Exercise per Session: Not on file  Stress:   . Feeling of Stress : Not on file  Social Connections:   . Frequency of Communication with Friends and Family: Not on file  . Frequency of Social Gatherings with Friends and Family: Not on file  . Attends Religious Services: Not on file  . Active Member of Clubs or Organizations: Not on file  . Attends Banker  Meetings: Not on file  . Marital Status: Not on file    Hospital Course:  He remained on the [facility] unit for 10 days. He was started on Zyprexa for mood stabilization and Gabapentin for substance withdrawal. He was restarted on his chronic medical medications for hyperlipidemia and hypertension.  He participated in group therapy on the unit. He responded well to treatment with no adverse effects reported. He has shown improved mood, affect, sleep, and interaction. He denies any SI/HI/AVH and contracts for safety. He is discharging on the medications listed below. He agrees to follow up at the [facility] for therapy and medication management. Patient is provided with prescriptions for medications  upon discharge. Kadan Millstein is being provided with city bus passes  for discharge to [facility].   Physical Findings: AIMS: Facial and Oral Movements Muscles of Facial Expression: None, normal Lips and Perioral Area: None, normal Jaw: None, normal Tongue: None, normal,Extremity Movements Upper (arms, wrists, hands, fingers): None, normal Lower (legs, knees, ankles, toes): None, normal, Trunk Movements Neck, shoulders, hips: None, normal, Overall Severity Severity of abnormal movements (highest score from questions above): None, normal Incapacitation due to abnormal movements: None, normal Patient's awareness of abnormal movements (rate only patient's report): No Awareness, Dental Status Current problems with teeth and/or dentures?: No Does patient usually wear dentures?: No  CIWA:  CIWA-Ar Total: 2 COWS:     Musculoskeletal: Strength & Muscle Tone: within normal limits Gait & Station: normal Patient leans: N/A  Psychiatric Specialty Exam: Physical Exam  Review of Systems  Constitutional: Negative for activity change and appetite change.  Respiratory: Negative for chest tightness and shortness of breath.   Cardiovascular: Negative for chest pain.  Gastrointestinal: Negative for  abdominal pain.  Neurological: Negative for facial asymmetry and headaches.   Blood pressure 128/90, pulse (!) 102, temperature 97.6 F (36.4 C), temperature source Oral, resp. rate 20, height 5\' 7"  (1.702 m), weight 78 kg, SpO2 99 %.Body mass index is 26.94 kg/m.  General Appearance: Casual  Eye Contact:  Good  Speech:  Clear and Coherent and Normal Rate  Volume:  Normal  Mood:  Euthymic  Affect:  Appropriate  Thought Process:  Coherent and Descriptions of Associations: Intact  Orientation:  Full (Time, Place, and Person)  Thought Content:  Logical  Suicidal Thoughts:  No  Homicidal Thoughts:  No  Memory:  Immediate;   Fair Recent;   Fair Remote;   Fair  Judgement:  Intact  Insight:  Fair  Psychomotor Activity:  Normal  Concentration:  Concentration: Fair and Attention Span: Fair  Recall:  of Knowledge:  Fair  Language:  Good  Akathisia:  Negative  Handed:  Right  AIMS (if indicated):     Assets:  Desire for Improvement Leisure Time Physical Health Resilience  ADL's:  Intact  Cognition:  WNL  Sleep:  Number of Hours: 6     Have you used any form of tobacco in the last 30 days? (Cigarettes, Smokeless Tobacco, Cigars, and/or Pipes): No  Has this patient used any form of tobacco in the last 30 days? (Cigarettes, Smokeless Tobacco, Cigars, and/or Pipes) Yes, Yes, Prescription not provided because: patient declined  Blood Alcohol level:  Lab Results  Component Value Date   ETH <10 02/26/2020   ETH <10 02/20/2020    Metabolic Disorder Labs:  Lab Results  Component Value Date   HGBA1C 5.8 (H) 02/29/2020   MPG 119.76 02/29/2020   MPG 120 11/25/2015   No results found for: PROLACTIN Lab Results  Component Value Date   CHOL 154 02/29/2020   TRIG 121 02/29/2020   HDL 50 02/29/2020   CHOLHDL 3.1 02/29/2020   VLDL 24 02/29/2020   LDLCALC 80 02/29/2020   LDLCALC 80 11/26/2015    See Psychiatric Specialty Exam and Suicide Risk Assessment completed by  Attending Physician prior to discharge.  Discharge destination:  Other:  [facility]  Is patient on multiple antipsychotic therapies at discharge:  No   Has Patient had three or more failed trials of antipsychotic monotherapy by history:  No  Recommended Plan for Multiple Antipsychotic Therapies: NA  Discharge Instructions    Diet - low  sodium heart healthy   Complete by: As directed    Increase activity slowly   Complete by: As directed      Allergies as of 03/09/2020      Reactions   Lactulose Hives, Nausea Only      Medication List    TAKE these medications     Indication  amLODipine 10 MG tablet Commonly known as: NORVASC Take 1 tablet (10 mg total) by mouth daily.  Indication: High Blood Pressure Disorder   aspirin EC 81 MG tablet Take 1 tablet (81 mg total) by mouth daily.  Indication: Disease involving Lipid Deposits in the Arteries   clopidogrel 75 MG tablet Commonly known as: PLAVIX Take 1 tablet (75 mg total) by mouth daily. Start taking on: March 10, 2020  Indication: Acute Coronary Syndrome   escitalopram 20 MG tablet Commonly known as: LEXAPRO Take 1 tablet (20 mg total) by mouth daily. Start taking on: March 10, 2020  Indication: Major Depressive Disorder   gabapentin 300 MG capsule Commonly known as: NEURONTIN Take 1 capsule (300 mg total) by mouth 3 (three) times daily.  Indication: Abuse or Misuse of Alcohol, Alcohol Withdrawal Syndrome   hydrOXYzine 25 MG tablet Commonly known as: ATARAX/VISTARIL Take 1 tablet (25 mg total) by mouth 3 (three) times daily as needed for anxiety.  Indication: Feeling Anxious   lisinopril 40 MG tablet Commonly known as: ZESTRIL Take 1 tablet (40 mg total) by mouth daily.  Indication: High Blood Pressure Disorder   OLANZapine 7.5 MG tablet Commonly known as: ZYPREXA Take 1 tablet (7.5 mg total) by mouth at bedtime. What changed:   medication strength  how much to take  when to take this   Indication: Major Depressive Disorder   pantoprazole 40 MG tablet Commonly known as: PROTONIX Take 1 tablet (40 mg total) by mouth daily. Start taking on: March 10, 2020  Indication: Heartburn   pravastatin 20 MG tablet Commonly known as: PRAVACHOL Take 1 tablet (20 mg total) by mouth daily.  Indication: High Amount of Fats in the Blood   traZODone 50 MG tablet Commonly known as: DESYREL Take 1 tablet (50 mg total) by mouth at bedtime as needed for sleep.  Indication: Trouble Sleeping       Follow-up Information    [facility].   Why: referral made to [facility]  fax:  [phone]  [phone]) call center for PCPs Contact information: [address] [facility] [place] [address] [phone]               Follow-up recommendations:  Activity:  as tolerated Diet:  Heart healthy  Comments:  Patient is instructed prior to discharge to:  Take all medications as prescribed by his/her mental healthcare provider. Report any adverse effects and or reactions from the medicines to his/her outpatient provider promptly. Patient has been instructed & cautioned: To not engage in alcohol and or illegal drug use while on prescription medicines. In the event of worsening symptoms, patient is instructed to call the crisis hotline, 911 and or go to the nearest ED for appropriate evaluation and treatment of symptoms. To follow-up with his/her primary care provider for your other medical issues, concerns and or health care needs.  Signed: Laveda Abbe, NP 03/09/2020, 12:37 PM

## 2020-03-09 NOTE — BHH Counselor (Signed)
CSW reviewed [employer] and shelter resources with the pt.   Fredirick Lathe, LCSWA Clinicial Social Worker [employer]

## 2020-03-09 NOTE — Progress Notes (Signed)
  [facility] Adult Case Management Discharge Plan :  Will you be returning to the same living situation after discharge:  Yes,  pt is homeless and was given [employer] and shelter resources.   At discharge, do you have transportation home?: No.Pt will be given bus passes and be transported via safe transport.  Do you have the ability to pay for your medications: Yes,  pt has insurance  Release of information consent forms completed and in the chart;  [name] needed at discharge.  Patient to Follow up at:  Follow-up Information    [facility].   Why: referral made to [facility]  fax:  [phone]  [phone]) call center for PCPs Contact information: [address] [facility] [place] [address] [phone]               Next level of care provider has access to [facility] Link:no  Safety Planning and Suicide Prevention discussed: Yes,  w/ pt.  Have you used any form of tobacco in the last 30 days? (Cigarettes, Smokeless Tobacco, Cigars, and/or Pipes): No  Has patient been referred to the Quitline?: Patient refused referral  Patient has been referred for addiction treatment: Pt. refused referral  Chrys Racer 03/09/2020, 9:13 AM

## 2020-03-09 NOTE — BHH Suicide Risk Assessment (Signed)
[facility] Discharge Suicide Risk Assessment   Principal Problem: Bipolar disorder, current episode depressed, severe, without psychotic features (HCC) Discharge Diagnoses: Principal Problem:   Bipolar disorder, current episode depressed, severe, without psychotic features (HCC) Active Problems:   Polysubstance abuse (HCC)   Vitamin D deficiency   Medically noncompliant   Abdominal pain   GERD (gastroesophageal reflux disease)   Back pain, chronic   Homeless   Essential hypertension   Stimulant-induced mood disorder (HCC)   Cocaine abuse (HCC)   Hypokalemia   Pre-diabetes   Elevated blood sugar   Total Time spent with patient: 20 minutes  Musculoskeletal: Strength & Muscle Tone: within normal limits Gait & Station: normal Patient leans: N/A  Psychiatric Specialty Exam: Review of Systems  Blood pressure 128/90, pulse (!) 102, temperature 97.6 F (36.4 C), temperature source Oral, resp. rate 20, height 5\' 7"  (1.702 m), weight 78 kg, SpO2 99 %.Body mass index is 26.94 kg/m.  General Appearance: Casual  Eye Contact::  Fair  Speech:  Clear and Coherent409  Volume:  Normal  Mood:  Euthymic  Affect:  Appropriate  Thought Process:  Coherent  Orientation:  Full (Time, Place, and Person)  Thought Content:  Logical  Suicidal Thoughts:  No  Homicidal Thoughts:  No  Memory:  Recent;   Fair  Judgement:  Fair  Insight:  Fair  Psychomotor Activity:  Normal  Concentration:  Fair  Recall:  [phone] of Knowledge:Fair  Language: Fair  Akathisia:  No  Handed:  Right  AIMS (if indicated):     Assets:  Desire for Improvement Leisure Time Physical Health Resilience  Sleep:  Number of Hours: 6  Cognition: WNL  ADL's:  Intact   Mental Status Per Nursing Assessment::   On Admission:  Suicidal ideation indicated by patient, Plan includes specific time, place, or method  Demographic Factors:  Male, Low socioeconomic status and Unemployed  Loss Factors: Financial problems/change  in socioeconomic status  Historical Factors: Family history of mental illness or substance abuse  Risk Reduction Factors:   Positive coping skills or problem solving skills  Continued Clinical Symptoms:  Previous Psychiatric Diagnoses and Treatments  Cognitive Features That Contribute To Risk:  None    Suicide Risk:  Minimal: No identifiable suicidal ideation.  Patients presenting with no risk factors but with morbid ruminations; may be classified as minimal risk based on the severity of the depressive symptoms   Follow-up Information    [facility] Follow [facility].   Why: referral made to [phone]  fax:  [phone]  [phone]) call center for PCPs Contact information: [address] [facility] [place] [address]               Plan Of Care/Follow-up recommendations:  Other:  Follow-up with outpatient psych services  [phone], MD 03/09/2020, 12:16 PM

## 2020-03-09 NOTE — ED Triage Notes (Signed)
Pt to ED with c/o mid abd pain nausea and vomiting x's 4 days   Denies any diarrhea

## 2020-03-09 NOTE — Progress Notes (Signed)
Discharge Note:  Patient was discharged with 2 bus tickets and was picked up by [employer].  Suicide prevention information given and discussed with patient who stated he understood and had no questions.  Patient denied SI and HI.  Denied A/V hallucinations.  Patient was asked several times if he had any SI thoughts, but patient always said "no".  Patient stated he received all his belongings, clothing, toiletries, misc items, etc.  Patient stated he appreciated all assistance received from [facility] staff.  All required discharge information given to patient at discharge.

## 2020-03-09 NOTE — Plan of Care (Signed)
Nurse discussed anxiety, depression and coping skills with patient.  

## 2020-03-10 MED ORDER — SUCRALFATE 1 G PO TABS: 1.0000 g | ORAL_TABLET | Freq: Three times a day (TID) | ORAL | 0 refills | Status: AC

## 2020-03-10 MED ORDER — DICYCLOMINE HCL 20 MG PO TABS: 20.0000 mg | ORAL_TABLET | Freq: Two times a day (BID) | ORAL | 0 refills | Status: AC

## 2020-03-10 NOTE — ED Notes (Signed)
Pt left without receiving discharge ppw.

## 2020-03-10 NOTE — ED Provider Notes (Signed)
[facility] EMERGENCY DEPARTMENT Provider Note   CSN: [id number] Arrival date & time: 03/09/20  1952     History Chief Complaint  Patient presents with  . Abdominal Pain    [name] is a 59 y.o. male.  HPI     This a 59 year old male with a history of polysubstance abuse, suicidal ideation, reflux, bipolar disorder who presents with abdominal pain.  Patient was just discharged from behavioral health yesterday around noon.  He has been admitted for alcohol detox and suicidal ideation.  Last alcoholic beverage was 8 or 9 days ago.  He reports crampy diffuse abdominal pain since Saturday.  It comes and goes.  Nothing seems to make it better or worse including eating.  He has had normal bowel movements and no nausea or vomiting.  States he had similar symptoms several months ago when he had Covid that self resolved.  He does have a history of GERD but is not taking his daily acid reducer.  Currently he rates his pain 6 out of 10.  Past Medical History:  Diagnosis Date  . Alcohol abuse   . Anxiety   . Arthritis    "all over" (11/25/2015)  . Bipolar 1 disorder (HCC)   . Bipolar affective disorder (HCC)   . Chronic back pain   . Chronic kidney disease   . Cocaine abuse (HCC)    smokes crack-cocaine  . Depression   . GERD (gastroesophageal reflux disease)   . Gout   . Headache    "q couple days; stress, tension, anger" (11/25/2015)  . Hemorrhoids   . Hepatitis C    "not yet treated" (11/25/2015)  . Hypertension   . Insomnia   . Myocardial infarction [facility]) 11/2015   "supposedly" (11/25/2015)  . OSA on CPAP    "had mask; it was stolen" (11/25/2015)  . PTSD (post-traumatic stress disorder)   . Tobacco abuse   . Vitamin D deficiency     Patient Active Problem List   Diagnosis Date Noted  . Pre-diabetes 03/05/2020  . Elevated blood sugar 03/05/2020  . 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 Cervical stenosis of spinal canal 10/31/2017  .  Coronary atherosclerosis of native coronary artery 01/17/2017  . New onset type 2 diabetes mellitus (HCC) 01/17/2017  . Hypertension 01/17/2017  . Malingering 06/18/2016  . Personality disorder (HCC) 06/18/2016  . Uncomplicated opioid dependence (HCC) 06/18/2016  . Complete heart block (HCC) 04/17/2016  . Heroin use disorder, severe (HCC) 04/17/2016  . Hypokalemia 04/17/2016  . Cocaine abuse (HCC) 02/23/2016  . Depressive disorder 02/23/2016  . Homicidal ideations 02/23/2016  . Suicidal ideations 02/23/2016  . Suicidal behavior 01/16/2016  . Homeless 11/26/2015  . Essential hypertension 11/26/2015  . SVT (supraventricular tachycardia) (HCC)   . Tobacco use disorder 11/15/2015  . Tetrahydrocannabinol (THC) use disorder, mild, abuse 08/20/2015  . Unspecified viral hepatitis C without hepatic coma 08/20/2015  . GERD (gastroesophageal reflux disease)   . Back pain, chronic   . Chronic kidney disease   . Alcohol use disorder, severe, dependence (HCC)   . Cocaine use disorder, severe, dependence (HCC)   . Abdominal pain   . Bipolar disorder, current episode depressed, severe, without psychotic features (HCC)   . Bipolar affective disorder, depressed, severe (HCC) 03/26/2014  . Right hip pain   . Medically noncompliant 03/23/2014  . Dysuria 03/22/2013  . Headache 03/22/2013  . Polysubstance dependence (HCC) 03/21/2012  . Chest pain 02/23/2012  . Polysubstance abuse (HCC)   .  Gout   . Vitamin D deficiency   . Hepatitis C     Past Surgical History:  Procedure Laterality Date  . CARDIAC CATHETERIZATION N/A 11/29/2015   Procedure: Left Heart Cath and Coronary Angiography;  Surgeon: Laurey Morale, MD;  Location: [facility] INVASIVE CV LAB;  Service: Cardiovascular;  Laterality: N/A;  . FRACTURE SURGERY    . PATELLA FRACTURE SURGERY Left ~ 1995  . TONSILLECTOMY         Family History  Problem Relation Age of Onset  . Hypertension Brother   . Diabetes Brother   . Hypertension Mother    . Diabetes Mother   . Arthritis Mother   . Other Sister        bone disease    Social History   Tobacco Use  . Smoking status: Current Every Day Smoker    Packs/day: 0.25    Years: 46.00    Pack years: 11.50    Types: Cigarettes  . Smokeless tobacco: Never Used  Vaping Use  . Vaping Use: Never used  Substance Use Topics  . Alcohol use: Yes  . Drug use: Yes    Types: Marijuana, "Crack" cocaine, Cocaine    Home Medications Prior to Admission medications   Medication Sig Start Date End Date Taking? Authorizing Provider  amLODipine (NORVASC) 10 MG tablet Take 1 tablet (10 mg total) by mouth daily. 03/09/20   Laveda Abbe, NP  aspirin EC 81 MG tablet Take 1 tablet (81 mg total) by mouth daily. 03/09/20   Laveda Abbe, NP  clopidogrel (PLAVIX) 75 MG tablet Take 1 tablet (75 mg total) by mouth daily. 03/10/20   Laveda Abbe, NP  dicyclomine (BENTYL) 20 MG tablet Take 1 tablet (20 mg total) by mouth 2 (two) times daily. 03/10/20   Avalie Oconnor, Mayer Masker, MD  escitalopram (LEXAPRO) 20 MG tablet Take 1 tablet (20 mg total) by mouth daily. 03/10/20   Laveda Abbe, NP  gabapentin (NEURONTIN) 300 MG capsule Take 1 capsule (300 mg total) by mouth 3 (three) times daily. 03/09/20   Laveda Abbe, NP  hydrOXYzine (ATARAX/VISTARIL) 25 MG tablet Take 1 tablet (25 mg total) by mouth 3 (three) times daily as needed for anxiety. 03/09/20   Laveda Abbe, NP  lisinopril (ZESTRIL) 40 MG tablet Take 1 tablet (40 mg total) by mouth daily. 03/09/20   Laveda Abbe, NP  OLANZapine (ZYPREXA) 7.5 MG tablet Take 1 tablet (7.5 mg total) by mouth at bedtime. 03/09/20   Laveda Abbe, NP  pantoprazole (PROTONIX) 40 MG tablet Take 1 tablet (40 mg total) by mouth daily. 03/10/20   Laveda Abbe, NP  pravastatin (PRAVACHOL) 20 MG tablet Take 1 tablet (20 mg total) by mouth daily. 03/09/20   Laveda Abbe, NP  sucralfate (CARAFATE) 1 g  tablet Take 1 tablet (1 g total) by mouth 4 (four) times daily -  with meals and at bedtime. 03/10/20   Tremon Sainvil, Mayer Masker, MD  traZODone (DESYREL) 50 MG tablet Take 1 tablet (50 mg total) by mouth at bedtime as needed for sleep. 03/09/20   Laveda Abbe, NP    Allergies    Lactulose  Review of Systems   Review of Systems  Constitutional: Negative for fever.  Respiratory: Negative for shortness of breath.   Gastrointestinal: Positive for abdominal pain. Negative for diarrhea, nausea and vomiting.  Genitourinary: Negative for dysuria.  All other systems reviewed and are negative.   Physical Exam Updated Vital  Signs BP (!) 135/92 (BP Location: Left Arm)   Pulse (!) 102   Temp 97.6 F (36.4 C) (Oral)   Resp 18   Ht 1.702 m (5\' 7" )   Wt 111.1 kg   SpO2 98%   BMI 38.37 kg/m   Physical Exam Vitals and nursing note reviewed.  Constitutional:      Appearance: He is well-developed. He is not ill-appearing.  HENT:     Head: Normocephalic and atraumatic.  Eyes:     Pupils: Pupils are equal, round, and reactive to light.  Cardiovascular:     Rate and Rhythm: Normal rate and regular rhythm.     Heart sounds: Normal heart sounds. No murmur heard.   Pulmonary:     Effort: Pulmonary effort is normal. No respiratory distress.     Breath sounds: Normal breath sounds. No wheezing.  Abdominal:     General: Bowel sounds are normal.     Palpations: Abdomen is soft.     Tenderness: There is no abdominal tenderness. There is no guarding or rebound.  Musculoskeletal:     Cervical back: Neck supple.  Lymphadenopathy:     Cervical: No cervical adenopathy.  Skin:    General: Skin is warm and dry.  Neurological:     Mental Status: He is alert and oriented to person, place, and time.  Psychiatric:        Mood and Affect: Mood normal.     ED Results / Procedures / Treatments   Labs (all labs ordered are listed, but only abnormal results are displayed) Labs Reviewed    COMPREHENSIVE METABOLIC PANEL - Abnormal; Notable for the following components:      Result Value   Glucose, Bld 111 (*)    BUN 21 (*)    All other components within normal limits  CBC - Abnormal; Notable for the following components:   WBC 11.5 (*)    All other components within normal limits  URINALYSIS, ROUTINE W REFLEX MICROSCOPIC - Abnormal; Notable for the following components:   Color, Urine STRAW (*)    Leukocytes,Ua SMALL (*)    Bacteria, UA RARE (*)    All other components within normal limits  LIPASE, BLOOD    EKG None  Radiology No results found.  Procedures Procedures (including critical care time)  Medications Ordered in ED Medications  dicyclomine (BENTYL) capsule 10 mg (10 mg Oral Given 03/10/20 0056)  ondansetron (ZOFRAN-ODT) disintegrating tablet 4 mg (4 mg Oral Given 03/10/20 0057)  alum & mag hydroxide-simeth (MAALOX/MYLANTA) 200-200-20 MG/5ML suspension 30 mL (30 mLs Oral Given 03/10/20 0055)    ED Course  I have reviewed the triage vital signs and the nursing notes.  Pertinent labs & imaging results that were available during my care of the patient were reviewed by me and considered in my medical decision making (see chart for details).    MDM Rules/Calculators/A&P                          Patient presents with abdominal pain.  Has been in the hospital behavioral health over the last several days and was discharged yesterday approximately 12 hours ago.  Reports pain since Saturday.  He is overall nontoxic and vital signs are notable for slight tachycardia.  His abdominal exam is fairly benign.  Considerations include but not limited to, gastritis, pancreatitis given his alcohol history, colitis, less likely appendicitis or cholecystitis given nonfocal exam.  Labs reviewed.  No significant  leukocytosis.  No metabolic derangements.  Lipase normal.  Urinalysis without evidence of urinary tract infection.  Patient is supposed to be on pantoprazole at home.   He was given Zofran, GI cocktail, and Bentyl.  Suspect ongoing gastritis versus reflux.  No indication for emergent imaging at this time.  Will discharge home and add Carafate and Bentyl to his regimen.    After history, exam, and medical workup I feel the patient has been appropriately medically screened and is safe for discharge home. Pertinent diagnoses were discussed with the patient. Patient was given return precautions.   Final Clinical Impression(s) / ED Diagnoses Final diagnoses:  Generalized abdominal pain    Rx / DC Orders ED Discharge Orders         Ordered    sucralfate (CARAFATE) 1 g tablet  3 times daily with meals & bedtime        03/10/20 0123    dicyclomine (BENTYL) 20 MG tablet  2 times daily        03/10/20 0123           Annasofia Pohl, Mayer Masker, MD 03/10/20 0126

## 2020-03-10 NOTE — Discharge Instructions (Addendum)
You were seen today for abdominal pain.  Your lab work-up is reassuring.  Continue your pantoprazole at home.  Add Carafate and Bentyl.  Follow-up with your primary physician

## 2021-12-05 ENCOUNTER — Emergency Department (HOSPITAL_COMMUNITY)
Admission: EM | Admit: 2021-12-05 | Discharge: 2021-12-05 | Discharge status: Against Medical Advice | Payer: No Typology Code available for payment source | Attending: Emergency Medicine | Admitting: Emergency Medicine

## 2021-12-05 ENCOUNTER — Other Ambulatory Visit: Payer: Self-pay

## 2021-12-05 ENCOUNTER — Encounter (HOSPITAL_COMMUNITY): Payer: Self-pay | Admitting: Emergency Medicine

## 2021-12-05 DIAGNOSIS — Z5321 Procedure and treatment not carried out due to patient leaving prior to being seen by health care provider: Secondary | ICD-10-CM | POA: Diagnosis not present

## 2021-12-05 DIAGNOSIS — R300 Dysuria: Secondary | ICD-10-CM | POA: Insufficient documentation

## 2021-12-05 DIAGNOSIS — R109 Unspecified abdominal pain: Secondary | ICD-10-CM | POA: Insufficient documentation

## 2021-12-05 LAB — CBC WITH DIFFERENTIAL/PLATELET
Abs Immature Granulocytes: 0.02 10*3/uL (ref 0.00–0.07)
Basophils Absolute: 0 10*3/uL (ref 0.0–0.1)
Basophils Relative: 0 %
Eosinophils Absolute: 0.2 10*3/uL (ref 0.0–0.5)
Eosinophils Relative: 2 %
HCT: 45.2 % (ref 39.0–52.0)
Hemoglobin: 15 g/dL (ref 13.0–17.0)
Immature Granulocytes: 0 %
Lymphocytes Relative: 31 %
Lymphs Abs: 2.3 10*3/uL (ref 0.7–4.0)
MCH: 28.9 pg (ref 26.0–34.0)
MCHC: 33.2 g/dL (ref 30.0–36.0)
MCV: 87.1 fL (ref 80.0–100.0)
Monocytes Absolute: 0.5 10*3/uL (ref 0.1–1.0)
Monocytes Relative: 6 %
Neutro Abs: 4.4 10*3/uL (ref 1.7–7.7)
Neutrophils Relative %: 61 %
Platelets: 224 10*3/uL (ref 150–400)
RBC: 5.19 MIL/uL (ref 4.22–5.81)
RDW: 14.1 % (ref 11.5–15.5)
WBC: 7.4 10*3/uL (ref 4.0–10.5)
nRBC: 0 % (ref 0.0–0.2)

## 2021-12-05 LAB — BASIC METABOLIC PANEL
Anion gap: 8 (ref 5–15)
BUN: 10 mg/dL (ref 8–23)
CO2: 22 mmol/L (ref 22–32)
Calcium: 9.1 mg/dL (ref 8.9–10.3)
Chloride: 109 mmol/L (ref 98–111)
Creatinine, Ser: 0.71 mg/dL (ref 0.61–1.24)
GFR, Estimated: >60 mL/min (ref >60)
Glucose, Bld: 90 mg/dL (ref 70–99)
Potassium: 3.5 mmol/L (ref 3.5–5.1)
Sodium: 139 mmol/L (ref 135–145)

## 2021-12-05 LAB — URINALYSIS, ROUTINE W REFLEX MICROSCOPIC
Bilirubin Urine: NEGATIVE
Glucose, UA: NEGATIVE mg/dL
Hgb urine dipstick: NEGATIVE
Ketones, ur: NEGATIVE mg/dL
Nitrite: NEGATIVE
Protein, ur: NEGATIVE mg/dL
Specific Gravity, Urine: 1.013 (ref 1.005–1.030)
pH: 6 (ref 5.0–8.0)

## 2021-12-05 NOTE — ED Provider Triage Note (Signed)
Emergency Medicine Provider Triage Evaluation Note  [name] , a 61 y.o. male  was evaluated in triage.  Pt complains of foul-smelling urine, flank pain ongoing for about a month.  Denies dysuria, fever, abdominal pain, nausea, vomiting.  Denies prior history of UTI.   Review of Systems  Positive: As above Negative: As above  Physical Exam  There were no vitals taken for this visit. Gen:   Awake, no distress   Resp:  Normal effort  MSK:   Moves extremities without difficulty  Other:    Medical Decision Making  Medically screening exam initiated at 11:02 AM.  Appropriate orders placed.  [name] was informed that the remainder of the evaluation will be completed by another provider, this initial triage assessment does not replace that evaluation, and the importance of remaining in the ED until their evaluation is complete.     Marita Kansas, PA-C 12/05/21 1103

## 2021-12-05 NOTE — ED Notes (Signed)
Pt is not answering when being called and nowhere to be found

## 2021-12-05 NOTE — ED Triage Notes (Signed)
Patient here with painful urination and flank pain for about a month now. Denies abdominal pain, n/v/d.

## 2022-12-28 ENCOUNTER — Encounter (HOSPITAL_COMMUNITY): Payer: Self-pay | Admitting: Emergency Medicine

## 2022-12-28 ENCOUNTER — Ambulatory Visit (HOSPITAL_COMMUNITY)
Admission: EM | Admit: 2022-12-28 | Discharge: 2022-12-28 | Disposition: A | Discharge status: Home / Self Care | Payer: No Typology Code available for payment source | Attending: Emergency Medicine | Admitting: Emergency Medicine

## 2022-12-28 DIAGNOSIS — N3001 Acute cystitis with hematuria: Secondary | ICD-10-CM | POA: Insufficient documentation

## 2022-12-28 LAB — POCT URINALYSIS DIP (MANUAL ENTRY)
Bilirubin, UA: NEGATIVE
Glucose, UA: NEGATIVE mg/dL
Ketones, POC UA: NEGATIVE mg/dL
Nitrite, UA: POSITIVE — AB
Protein Ur, POC: NEGATIVE mg/dL
Spec Grav, UA: >=1.03 — AB (ref 1.010–1.025)
Urobilinogen, UA: 0.2 E.U./dL
pH, UA: 7 (ref 5.0–8.0)

## 2022-12-28 MED ORDER — SULFAMETHOXAZOLE-TRIMETHOPRIM 800-160 MG PO TABS: 1.0000 | ORAL_TABLET | Freq: Two times a day (BID) | ORAL | 0 refills | Status: AC

## 2022-12-28 NOTE — ED Provider Notes (Signed)
[facility]    CSN: [id number] Arrival date & time: 12/28/22  1232      History   Chief Complaint Chief Complaint  Patient presents with   Urine Ordor    HPI [name] is a 62 y.o. male.   Patient presents with foul-smelling urine x 2 months.  Denies dysuria, hematuria, abdominal pain, back pain, fever, and penile discharge.     Past Medical History:  Diagnosis Date   Alcohol abuse    Anxiety    Arthritis    "all over" (11/25/2015)   Bipolar 1 disorder (HCC)    Bipolar affective disorder (HCC)    Chronic back pain    Chronic kidney disease    Cocaine abuse (HCC)    smokes crack-cocaine   Depression    GERD (gastroesophageal reflux disease)    Gout    Headache    "q couple days; stress, tension, anger" (11/25/2015)   Hemorrhoids    Hepatitis C    "not yet treated" (11/25/2015)   Hypertension    Insomnia    Myocardial infarction (HCC) 11/2015   "supposedly" (11/25/2015)   OSA on CPAP    "had mask; it was stolen" (11/25/2015)   PTSD (post-traumatic stress disorder)    Tobacco abuse    Vitamin D deficiency     Patient Active Problem List   Diagnosis Date Noted   Pre-diabetes 03/05/2020   Elevated blood sugar 03/05/2020   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   Cervical stenosis of spinal canal 10/31/2017   Coronary atherosclerosis of native coronary artery 01/17/2017   New onset type 2 diabetes mellitus (HCC) 01/17/2017   Hypertension 01/17/2017   Malingering 06/18/2016   Personality disorder (HCC) 06/18/2016   Uncomplicated opioid dependence (HCC) 06/18/2016   Complete heart block (HCC) 04/17/2016   Heroin use disorder, severe (HCC) 04/17/2016   Hypokalemia 04/17/2016   Cocaine abuse (HCC) 02/23/2016   Depressive disorder 02/23/2016   Homicidal ideations 02/23/2016   Suicidal ideations 02/23/2016   Suicidal behavior 01/16/2016   Homeless 11/26/2015   Essential hypertension 11/26/2015   SVT (supraventricular tachycardia)     Tobacco use disorder 11/15/2015   Tetrahydrocannabinol (THC) use disorder, mild, abuse 08/20/2015   Unspecified viral hepatitis C without hepatic coma 08/20/2015   GERD (gastroesophageal reflux disease)    Back pain, chronic    Chronic kidney disease    Alcohol use disorder, severe, dependence (HCC)    Cocaine use disorder, severe, dependence (HCC)    Abdominal pain    Bipolar disorder, current episode depressed, severe, without psychotic features (HCC)    Bipolar affective disorder, depressed, severe (HCC) 03/26/2014   Right hip pain    Medically noncompliant 03/23/2014   Dysuria 03/22/2013   Headache 03/22/2013   Polysubstance dependence (HCC) 03/21/2012   Chest pain 02/23/2012   Polysubstance abuse (HCC)    Gout    Vitamin D deficiency    Hepatitis C     Past Surgical History:  Procedure Laterality Date   CARDIAC CATHETERIZATION N/A 11/29/2015   Procedure: Left Heart Cath and Coronary Angiography;  Surgeon: Laurey Morale, MD;  Location: [facility] INVASIVE CV LAB;  Service: Cardiovascular;  Laterality: N/A;   FRACTURE SURGERY     PATELLA FRACTURE SURGERY Left ~ 1995   TONSILLECTOMY         Home Medications    Prior to Admission medications   Medication Sig Start Date End Date Taking? Authorizing Provider  sulfamethoxazole-trimethoprim (BACTRIM DS) 800-160 MG tablet Take  1 tablet by mouth 2 (two) times daily for 7 days. 12/28/22 01/04/23 Yes Miken Stecher A, NP  amLODipine (NORVASC) 10 MG tablet Take 1 tablet (10 mg total) by mouth daily. 03/09/20   Laveda Abbe, NP  aspirin EC 81 MG tablet Take 1 tablet (81 mg total) by mouth daily. 03/09/20   Laveda Abbe, NP  clopidogrel (PLAVIX) 75 MG tablet Take 1 tablet (75 mg total) by mouth daily. 03/10/20   Laveda Abbe, NP  dicyclomine (BENTYL) 20 MG tablet Take 1 tablet (20 mg total) by mouth 2 (two) times daily. 03/10/20   Horton, Mayer Masker, MD  escitalopram (LEXAPRO) 20 MG tablet Take 1 tablet (20 mg  total) by mouth daily. 03/10/20   Laveda Abbe, NP  gabapentin (NEURONTIN) 300 MG capsule Take 1 capsule (300 mg total) by mouth 3 (three) times daily. 03/09/20   Laveda Abbe, NP  hydrOXYzine (ATARAX/VISTARIL) 25 MG tablet Take 1 tablet (25 mg total) by mouth 3 (three) times daily as needed for anxiety. 03/09/20   Laveda Abbe, NP  lisinopril (ZESTRIL) 40 MG tablet Take 1 tablet (40 mg total) by mouth daily. 03/09/20   Laveda Abbe, NP  OLANZapine (ZYPREXA) 7.5 MG tablet Take 1 tablet (7.5 mg total) by mouth at bedtime. 03/09/20   Laveda Abbe, NP  pantoprazole (PROTONIX) 40 MG tablet Take 1 tablet (40 mg total) by mouth daily. 03/10/20   Laveda Abbe, NP  pravastatin (PRAVACHOL) 20 MG tablet Take 1 tablet (20 mg total) by mouth daily. 03/09/20   Laveda Abbe, NP  sucralfate (CARAFATE) 1 g tablet Take 1 tablet (1 g total) by mouth 4 (four) times daily -  with meals and at bedtime. 03/10/20   Horton, Mayer Masker, MD  traZODone (DESYREL) 50 MG tablet Take 1 tablet (50 mg total) by mouth at bedtime as needed for sleep. 03/09/20   Laveda Abbe, NP    Family History Family History  Problem Relation Age of Onset   Hypertension Brother    Diabetes Brother    Hypertension Mother    Diabetes Mother    Arthritis Mother    Other Sister        bone disease    Social History Social History   Tobacco Use   Smoking status: Former    Current packs/day: 0.25    Average packs/day: 0.3 packs/day for 46.0 years (11.5 ttl pk-yrs)    Types: Cigarettes   Smokeless tobacco: Never  Vaping Use   Vaping status: Some Days  Substance Use Topics   Alcohol use: Not Currently   Drug use: Not Currently    Types: Marijuana, "Crack" cocaine, Cocaine     Allergies   Lactulose   Review of Systems Review of Systems  Constitutional:  Negative for chills, fatigue and fever.  Gastrointestinal:  Negative for abdominal pain.  Genitourinary:   Negative for difficulty urinating, dysuria, flank pain, frequency, hematuria, penile discharge, penile pain, penile swelling, scrotal swelling, testicular pain and urgency.  Musculoskeletal:  Negative for back pain.     Physical Exam Triage Vital Signs ED Triage Vitals  Encounter Vitals Group     BP 12/28/22 1325 (!) 165/97     Systolic BP Percentile --      Diastolic BP Percentile --      Pulse Rate 12/28/22 1325 67     Resp 12/28/22 1325 19     Temp 12/28/22 1325 97.9 F (36.6 C)  Temp Source 12/28/22 1325 Oral     SpO2 12/28/22 1325 96 %     Weight --      Height --      Head Circumference --      Peak Flow --      Pain Score 12/28/22 1323 0     Pain Loc --      Pain Education --      Exclude from Growth Chart --    No data found.  Updated Vital Signs BP (!) 165/97 (BP Location: Left Arm) Comment: pt did not take BP meds today  Pulse 67   Temp 97.9 F (36.6 C) (Oral)   Resp 19   SpO2 96%   Visual Acuity Right Eye Distance:   Left Eye Distance:   Bilateral Distance:    Right Eye Near:   Left Eye Near:    Bilateral Near:     Physical Exam Vitals and nursing note reviewed.  Constitutional:      General: Cillian is not in acute distress.    Appearance: Normal appearance. Trexton is not ill-appearing, toxic-appearing or diaphoretic.  Cardiovascular:     Rate and Rhythm: Normal rate.     Heart sounds: Normal heart sounds.  Pulmonary:     Effort: Pulmonary effort is normal.     Breath sounds: Normal breath sounds.  Abdominal:     General: Abdomen is flat. There is no distension.     Palpations: Abdomen is soft.     Tenderness: There is no abdominal tenderness. There is no right CVA tenderness, left CVA tenderness or guarding.  Genitourinary:    Comments: Exam deferred.  Skin:    General: Skin is warm and dry.  Neurological:     General: No focal deficit present.     Mental Status: Philipe is alert and oriented to person, place, and time.      UC  Treatments / Results  Labs (all labs ordered are listed, but only abnormal results are displayed) Labs Reviewed  POCT URINALYSIS DIP (MANUAL ENTRY) - Abnormal; Notable for the following components:      Result Value   Clarity, UA cloudy (*)    Spec Grav, UA >=1.030 (*)    Blood, UA trace-lysed (*)    Nitrite, UA Positive (*)    Leukocytes, UA Small (1+) (*)    All other components within normal limits  URINE CULTURE    EKG   Radiology No results found.  Procedures Procedures (including critical care time)  Medications Ordered in UC Medications - No data to display  Initial Impression / Assessment and Plan / UC Course  I have reviewed the triage vital signs and the nursing notes.  Pertinent labs & imaging results that were available during my care of the patient were reviewed by me and considered in my medical decision making (see chart for details).     Patient presented with 8-month history of foul-smelling urine.  Denies dysuria, hematuria, abdominal pain, back pain, fever, penile discharge. Declines STD testing.  No significant findings upon assessment.  GU exam deferred.  UA revealed leukocytes, nitrates, and trace blood.  Will send culture.  Prescribed Bactrim for acute cystitis.  Discussed follow-up, return, emergency department precautions. Final Clinical Impressions(s) / UC Diagnoses   Final diagnoses:  Acute cystitis with hematuria     Discharge Instructions      Start taking Bactrim twice daily for 7 days for urinary tract infection. Someone will call you to let you  know if treatment needs to be adjusted based on urine culture results. If you develop severe abdominal pain, back pain, fever or blood in urine please seek immediate medical treatment in the ER. Return here as needed.     ED Prescriptions     Medication Sig Dispense Auth. Provider   sulfamethoxazole-trimethoprim (BACTRIM DS) 800-160 MG tablet Take 1 tablet by mouth 2 (two) times daily for 7  days. 14 tablet Wynonia Lawman A, NP      PDMP not reviewed this encounter.   Wynonia Lawman A, NP 12/28/22 1439

## 2022-12-28 NOTE — Discharge Instructions (Signed)
Start taking Bactrim twice daily for 7 days for urinary tract infection. Someone will call you to let you know if treatment needs to be adjusted based on urine culture results. If you develop severe abdominal pain, back pain, fever or blood in urine please seek immediate medical treatment in the ER. Return here as needed.

## 2022-12-28 NOTE — ED Triage Notes (Signed)
Pt states that urine has smell that started about 2 months ago. Denies burning or pain during urination.

## 2023-01-01 ENCOUNTER — Telehealth: Payer: Self-pay

## 2023-01-01 LAB — URINE CULTURE: Culture: >=100000 — AB

## 2023-01-01 MED ORDER — NITROFURANTOIN MONOHYD MACRO 100 MG PO CAPS: 100.0000 mg | ORAL_CAPSULE | Freq: Two times a day (BID) | ORAL | 0 refills | Status: DC

## 2023-01-01 NOTE — Telephone Encounter (Signed)
Per Helaine Chess,  PA-C, "Should change him to macrobid 100 mg BID x 5 days." Attempted to reach patient x1. LVM. Rx sent to pharmacy on file.

## 2023-03-19 ENCOUNTER — Telehealth (HOSPITAL_COMMUNITY): Payer: Self-pay

## 2023-03-19 ENCOUNTER — Ambulatory Visit (HOSPITAL_COMMUNITY)
Admission: EM | Admit: 2023-03-19 | Discharge: 2023-03-19 | Disposition: A | Discharge status: Home / Self Care | Payer: No Typology Code available for payment source | Attending: Family Medicine | Admitting: Family Medicine

## 2023-03-19 ENCOUNTER — Encounter (HOSPITAL_COMMUNITY): Payer: Self-pay

## 2023-03-19 DIAGNOSIS — R350 Frequency of micturition: Secondary | ICD-10-CM

## 2023-03-19 DIAGNOSIS — N3000 Acute cystitis without hematuria: Secondary | ICD-10-CM

## 2023-03-19 LAB — POCT URINALYSIS DIP (MANUAL ENTRY)
Bilirubin, UA: NEGATIVE
Blood, UA: NEGATIVE
Glucose, UA: NEGATIVE mg/dL
Nitrite, UA: POSITIVE — AB
Spec Grav, UA: 1.02 (ref 1.010–1.025)
Urobilinogen, UA: 1 U/dL
pH, UA: 7 (ref 5.0–8.0)

## 2023-03-19 MED ORDER — CEPHALEXIN 500 MG PO CAPS: 500.0000 mg | ORAL_CAPSULE | Freq: Four times a day (QID) | ORAL | 0 refills | Status: DC

## 2023-03-19 MED ORDER — METRONIDAZOLE 500 MG PO TABS: 500.0000 mg | ORAL_TABLET | Freq: Two times a day (BID) | ORAL | 0 refills | Status: DC

## 2023-03-19 MED ORDER — METRONIDAZOLE 500 MG PO TABS: 500.0000 mg | ORAL_TABLET | Freq: Two times a day (BID) | ORAL | 0 refills | Status: AC

## 2023-03-19 MED ORDER — LIDOCAINE HCL (PF) 1 % IJ SOLN
INTRAMUSCULAR | Status: AC
  Filled 2023-03-19: qty 4

## 2023-03-19 MED ORDER — CEPHALEXIN 500 MG PO CAPS: 500.0000 mg | ORAL_CAPSULE | Freq: Four times a day (QID) | ORAL | 0 refills | Status: AC

## 2023-03-19 MED ORDER — CEFTRIAXONE SODIUM 1 G IJ SOLR
INTRAMUSCULAR | Status: AC
  Filled 2023-03-19: qty 10

## 2023-03-19 MED ORDER — CEFTRIAXONE SODIUM 1 G IJ SOLR
1.0000 g | Freq: Once | INTRAMUSCULAR | Status: AC
  Administered 2023-03-19: 1 g via INTRAMUSCULAR

## 2023-03-19 NOTE — Discharge Instructions (Addendum)
Discussed the possibility of STD being a cause of some your symptoms. Take full dose of medications with food If symptoms persist you will need to follow-up to have a another urine or urethral swab completed Follow-up with your PCP in 1 week If symptoms persist follow-up with a urologist

## 2023-03-19 NOTE — ED Provider Notes (Signed)
[facility]    CSN: [id number] Arrival date & time: 03/19/23  1021      History   Chief Complaint Chief Complaint  Patient presents with   Dysuria    HPI COOLIDGE COFFING is a 62 y.o. male.   Patient presents today with urinary frequency burning on urination for several months now.  Patient states that he has been on antibiotics for this and it is not getting any better.  Patient denies any lower abdominal pain no hematuria no discharge.  Patient feels that he just continues to get urinary tract infections.  Denies any fevers no nausea vomiting or diarrhea    Past Medical History:  Diagnosis Date   Alcohol abuse    Anxiety    Arthritis    "all over" (11/25/2015)   Bipolar 1 disorder (HCC)    Bipolar affective disorder (HCC)    Chronic back pain    Chronic kidney disease    Cocaine abuse (HCC)    smokes crack-cocaine   Depression    GERD (gastroesophageal reflux disease)    Gout    Headache    "q couple days; stress, tension, anger" (11/25/2015)   Hemorrhoids    Hepatitis C    "not yet treated" (11/25/2015)   Hypertension    Insomnia    Myocardial infarction (HCC) 11/2015   "supposedly" (11/25/2015)   OSA on CPAP    "had mask; it was stolen" (11/25/2015)   PTSD (post-traumatic stress disorder)    Tobacco abuse    Vitamin D deficiency     Patient Active Problem List   Diagnosis Date Noted   Pre-diabetes 03/05/2020   Elevated blood sugar 03/05/2020   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   Cervical stenosis of spinal canal 10/31/2017   Coronary atherosclerosis of native coronary artery 01/17/2017   New onset type 2 diabetes mellitus (HCC) 01/17/2017   Hypertension 01/17/2017   Malingering 06/18/2016   Personality disorder (HCC) 06/18/2016   Uncomplicated opioid dependence (HCC) 06/18/2016   Complete heart block (HCC) 04/17/2016   Heroin use disorder, severe (HCC) 04/17/2016   Hypokalemia 04/17/2016   Cocaine abuse (HCC) 02/23/2016    Depressive disorder 02/23/2016   Homicidal ideations 02/23/2016   Suicidal ideations 02/23/2016   Suicidal behavior 01/16/2016   Homeless 11/26/2015   Essential hypertension 11/26/2015   SVT (supraventricular tachycardia) (HCC)    Tobacco use disorder 11/15/2015   Tetrahydrocannabinol (THC) use disorder, mild, abuse 08/20/2015   Unspecified viral hepatitis C without hepatic coma 08/20/2015   GERD (gastroesophageal reflux disease)    Back pain, chronic    Chronic kidney disease    Alcohol use disorder, severe, dependence (HCC)    Cocaine use disorder, severe, dependence (HCC)    Abdominal pain    Bipolar disorder, current episode depressed, severe, without psychotic features (HCC)    Bipolar affective disorder, depressed, severe (HCC) 03/26/2014   Right hip pain    Medically noncompliant 03/23/2014   Dysuria 03/22/2013   Headache 03/22/2013   Polysubstance dependence (HCC) 03/21/2012   Chest pain 02/23/2012   Polysubstance abuse (HCC)    Gout    Vitamin D deficiency    Hepatitis C     Past Surgical History:  Procedure Laterality Date   CARDIAC CATHETERIZATION N/A 11/29/2015   Procedure: Left Heart Cath and Coronary Angiography;  Surgeon: Laurey Morale, MD;  Location: [facility] INVASIVE CV LAB;  Service: Cardiovascular;  Laterality: N/A;   FRACTURE SURGERY     PATELLA FRACTURE SURGERY  Left ~ 1995   TONSILLECTOMY         Home Medications    Prior to Admission medications   Medication Sig Start Date End Date Taking? Authorizing Provider  cephALEXin (KEFLEX) 500 MG capsule Take 1 capsule (500 mg total) by mouth 4 (four) times daily. 03/19/23  Yes Coralyn Mark, NP  metroNIDAZOLE (FLAGYL) 500 MG tablet Take 1 tablet (500 mg total) by mouth 2 (two) times daily. 03/19/23  Yes Coralyn Mark, NP  amLODipine (NORVASC) 10 MG tablet Take 1 tablet (10 mg total) by mouth daily. 03/09/20   Laveda Abbe, NP  aspirin EC 81 MG tablet Take 1 tablet (81 mg total) by mouth  daily. 03/09/20   Laveda Abbe, NP  clopidogrel (PLAVIX) 75 MG tablet Take 1 tablet (75 mg total) by mouth daily. 03/10/20   Laveda Abbe, NP  dicyclomine (BENTYL) 20 MG tablet Take 1 tablet (20 mg total) by mouth 2 (two) times daily. 03/10/20   Horton, Mayer Masker, MD  escitalopram (LEXAPRO) 20 MG tablet Take 1 tablet (20 mg total) by mouth daily. 03/10/20   Laveda Abbe, NP  gabapentin (NEURONTIN) 300 MG capsule Take 1 capsule (300 mg total) by mouth 3 (three) times daily. 03/09/20   Laveda Abbe, NP  hydrOXYzine (ATARAX/VISTARIL) 25 MG tablet Take 1 tablet (25 mg total) by mouth 3 (three) times daily as needed for anxiety. 03/09/20   Laveda Abbe, NP  lisinopril (ZESTRIL) 40 MG tablet Take 1 tablet (40 mg total) by mouth daily. 03/09/20   Laveda Abbe, NP  OLANZapine (ZYPREXA) 7.5 MG tablet Take 1 tablet (7.5 mg total) by mouth at bedtime. 03/09/20   Laveda Abbe, NP  pantoprazole (PROTONIX) 40 MG tablet Take 1 tablet (40 mg total) by mouth daily. 03/10/20   Laveda Abbe, NP  pravastatin (PRAVACHOL) 20 MG tablet Take 1 tablet (20 mg total) by mouth daily. 03/09/20   Laveda Abbe, NP  sucralfate (CARAFATE) 1 g tablet Take 1 tablet (1 g total) by mouth 4 (four) times daily -  with meals and at bedtime. 03/10/20   Horton, Mayer Masker, MD  traZODone (DESYREL) 50 MG tablet Take 1 tablet (50 mg total) by mouth at bedtime as needed for sleep. 03/09/20   Laveda Abbe, NP    Family History Family History  Problem Relation Age of Onset   Hypertension Brother    Diabetes Brother    Hypertension Mother    Diabetes Mother    Arthritis Mother    Other Sister        bone disease    Social History Social History   Tobacco Use   Smoking status: Former    Current packs/day: 0.25    Average packs/day: 0.3 packs/day for 46.0 years (11.5 ttl pk-yrs)    Types: Cigarettes   Smokeless tobacco: Never  Vaping Use   Vaping  status: Some Days  Substance Use Topics   Alcohol use: Not Currently   Drug use: Not Currently    Types: Marijuana, "Crack" cocaine, Cocaine     Allergies   Lactulose   Review of Systems Review of Systems  Constitutional:  Negative for chills and fever.  Respiratory: Negative.    Cardiovascular: Negative.   Gastrointestinal:  Negative for abdominal pain, diarrhea, nausea and vomiting.  Genitourinary:  Positive for frequency and urgency. Negative for flank pain, hematuria, penile discharge, penile pain, penile swelling, scrotal swelling and testicular pain.  Physical Exam Triage Vital Signs ED Triage Vitals  Encounter Vitals Group     BP 03/19/23 1136 (!) 172/102     Systolic BP Percentile --      Diastolic BP Percentile --      Pulse Rate 03/19/23 1136 95     Resp 03/19/23 1136 16     Temp 03/19/23 1136 98.3 F (36.8 C)     Temp Source 03/19/23 1136 Oral     SpO2 03/19/23 1136 98 %     Weight --      Height --      Head Circumference --      Peak Flow --      Pain Score 03/19/23 1138 0     Pain Loc --      Pain Education --      Exclude from Growth Chart --    No data found.  Updated Vital Signs BP (!) 172/102 (BP Location: Left Arm)   Pulse 95   Temp 98.3 F (36.8 C) (Oral)   Resp 16   SpO2 98%   Visual Acuity Right Eye Distance:   Left Eye Distance:   Bilateral Distance:    Right Eye Near:   Left Eye Near:    Bilateral Near:     Physical Exam Constitutional:      Appearance: Normal appearance.  Cardiovascular:     Rate and Rhythm: Normal rate.  Pulmonary:     Effort: Pulmonary effort is normal.  Abdominal:     General: Abdomen is flat. Bowel sounds are normal. There is no distension.     Tenderness: There is no abdominal tenderness. There is no right CVA tenderness or left CVA tenderness.  Neurological:     General: No focal deficit present.     Mental Status: Wade is alert.      UC Treatments / Results  Labs (all labs ordered are  listed, but only abnormal results are displayed) Labs Reviewed  POCT URINALYSIS DIP (MANUAL ENTRY) - Abnormal; Notable for the following components:      Result Value   Clarity, UA cloudy (*)    Ketones, POC UA trace (5) (*)    Protein Ur, POC trace (*)    Nitrite, UA Positive (*)    Leukocytes, UA Moderate (2+) (*)    All other components within normal limits    EKG   Radiology No results found.  Procedures Procedures (including critical care time)  Medications Ordered in UC Medications  cefTRIAXone (ROCEPHIN) injection 1 g (has no administration in time range)    Initial Impression / Assessment and Plan / UC Course  I have reviewed the triage vital signs and the nursing notes.  Pertinent labs & imaging results that were available during my care of the patient were reviewed by me and considered in my medical decision making (see chart for details).    Discussed the possibility of STD being a cause of some your symptoms.  Patient did not feel at this time he needed STD testing.  I discussed with patient that he may will follow-up with urology at the [facility] clinic and have testing completed if the system symptoms persist after this round of antibiotics.  Take full dose of medications with food If symptoms persist you will need to follow-up to have a another urine or urethral swab completed Follow-up with your PCP in 1 week If symptoms persist follow-up with a urologist   Final Clinical Impressions(s) / UC Diagnoses  Final diagnoses:  Acute cystitis without hematuria  Urinary frequency     Discharge Instructions      Discussed the possibility of STD being a cause of some your symptoms. Take full dose of medications with food If symptoms persist you will need to follow-up to have a another urine or urethral swab completed Follow-up with your PCP in 1 week If symptoms persist follow-up with a urologist     ED Prescriptions     Medication Sig Dispense Auth.  Provider   metroNIDAZOLE (FLAGYL) 500 MG tablet Take 1 tablet (500 mg total) by mouth 2 (two) times daily. 14 tablet Maple Mirza L, NP   cephALEXin (KEFLEX) 500 MG capsule Take 1 capsule (500 mg total) by mouth 4 (four) times daily. 20 capsule Coralyn Mark, NP      PDMP not reviewed this encounter.   Coralyn Mark, NP 03/19/23 [phone]

## 2023-03-19 NOTE — ED Triage Notes (Signed)
Patient states he has a bladder infection because he urine has an odor.
